# Patient Record
Sex: Female | Born: 1935 | Race: White | Hispanic: No | Smoking: Never smoker
Health system: Southern US, Community
[De-identification: ages and names within clinical notes are randomized; demographics above are authoritative.]

## PROBLEM LIST (undated history)

## (undated) DIAGNOSIS — I2510 Atherosclerotic heart disease of native coronary artery without angina pectoris: Secondary | ICD-10-CM

## (undated) DIAGNOSIS — M1990 Unspecified osteoarthritis, unspecified site: Secondary | ICD-10-CM

## (undated) DIAGNOSIS — N184 Chronic kidney disease, stage 4 (severe): Secondary | ICD-10-CM

## (undated) DIAGNOSIS — M810 Age-related osteoporosis without current pathological fracture: Secondary | ICD-10-CM

## (undated) DIAGNOSIS — K219 Gastro-esophageal reflux disease without esophagitis: Secondary | ICD-10-CM

## (undated) DIAGNOSIS — I10 Essential (primary) hypertension: Secondary | ICD-10-CM

## (undated) DIAGNOSIS — I509 Heart failure, unspecified: Secondary | ICD-10-CM

## (undated) DIAGNOSIS — K279 Peptic ulcer, site unspecified, unspecified as acute or chronic, without hemorrhage or perforation: Secondary | ICD-10-CM

## (undated) DIAGNOSIS — Z9889 Other specified postprocedural states: Secondary | ICD-10-CM

## (undated) DIAGNOSIS — E113599 Type 2 diabetes mellitus with proliferative diabetic retinopathy without macular edema, unspecified eye: Secondary | ICD-10-CM

## (undated) DIAGNOSIS — D649 Anemia, unspecified: Secondary | ICD-10-CM

## (undated) DIAGNOSIS — C801 Malignant (primary) neoplasm, unspecified: Secondary | ICD-10-CM

## (undated) DIAGNOSIS — R0602 Shortness of breath: Secondary | ICD-10-CM

## (undated) DIAGNOSIS — I219 Acute myocardial infarction, unspecified: Secondary | ICD-10-CM

## (undated) DIAGNOSIS — E222 Syndrome of inappropriate secretion of antidiuretic hormone: Secondary | ICD-10-CM

## (undated) DIAGNOSIS — E7800 Pure hypercholesterolemia, unspecified: Secondary | ICD-10-CM

## (undated) DIAGNOSIS — R51 Headache: Secondary | ICD-10-CM

## (undated) HISTORY — PX: APPENDECTOMY: SHX54

## (undated) HISTORY — DX: Anemia, unspecified: D64.9

## (undated) HISTORY — PX: EYE SURGERY: SHX253

## (undated) HISTORY — DX: Pure hypercholesterolemia, unspecified: E78.00

## (undated) HISTORY — DX: Type 2 diabetes mellitus with proliferative diabetic retinopathy without macular edema, unspecified eye: E11.3599

## (undated) HISTORY — DX: Peptic ulcer, site unspecified, unspecified as acute or chronic, without hemorrhage or perforation: K27.9

## (undated) HISTORY — DX: Age-related osteoporosis without current pathological fracture: M81.0

## (undated) HISTORY — DX: Syndrome of inappropriate secretion of antidiuretic hormone: E22.2

## (undated) HISTORY — PX: TONSILLECTOMY: SUR1361

## (undated) HISTORY — DX: Gastro-esophageal reflux disease without esophagitis: K21.9

## (undated) HISTORY — DX: Essential (primary) hypertension: I10

## (undated) HISTORY — DX: Chronic kidney disease, stage 4 (severe): N18.4

## (undated) HISTORY — DX: Other specified postprocedural states: Z98.890

---

## 1998-07-09 ENCOUNTER — Encounter: Payer: Self-pay | Admitting: Ophthalmology

## 1998-07-14 ENCOUNTER — Ambulatory Visit (HOSPITAL_COMMUNITY): Admission: RE | Admit: 1998-07-14 | Discharge: 1998-07-15 | Payer: Self-pay | Admitting: Ophthalmology

## 1998-11-17 ENCOUNTER — Ambulatory Visit (HOSPITAL_COMMUNITY): Admission: RE | Admit: 1998-11-17 | Discharge: 1998-11-17 | Payer: Self-pay | Admitting: Ophthalmology

## 1999-11-09 ENCOUNTER — Ambulatory Visit (HOSPITAL_COMMUNITY): Admission: RE | Admit: 1999-11-09 | Discharge: 1999-11-10 | Payer: Self-pay | Admitting: Ophthalmology

## 1999-11-09 ENCOUNTER — Encounter: Payer: Self-pay | Admitting: Ophthalmology

## 2000-06-13 ENCOUNTER — Ambulatory Visit (HOSPITAL_COMMUNITY): Admission: RE | Admit: 2000-06-13 | Discharge: 2000-06-13 | Payer: Self-pay | Admitting: Ophthalmology

## 2000-06-20 ENCOUNTER — Other Ambulatory Visit: Admission: RE | Admit: 2000-06-20 | Discharge: 2000-06-20 | Payer: Self-pay | Admitting: Family Medicine

## 2000-06-21 ENCOUNTER — Encounter: Payer: Self-pay | Admitting: Family Medicine

## 2000-06-21 ENCOUNTER — Ambulatory Visit (HOSPITAL_COMMUNITY): Admission: RE | Admit: 2000-06-21 | Discharge: 2000-06-21 | Payer: Self-pay | Admitting: Family Medicine

## 2001-09-16 ENCOUNTER — Emergency Department (HOSPITAL_COMMUNITY): Admission: EM | Admit: 2001-09-16 | Discharge: 2001-09-16 | Payer: Self-pay | Admitting: Emergency Medicine

## 2002-10-16 ENCOUNTER — Encounter: Payer: Self-pay | Admitting: Emergency Medicine

## 2002-10-16 ENCOUNTER — Emergency Department (HOSPITAL_COMMUNITY): Admission: EM | Admit: 2002-10-16 | Discharge: 2002-10-16 | Payer: Self-pay | Admitting: Emergency Medicine

## 2003-06-02 ENCOUNTER — Inpatient Hospital Stay (HOSPITAL_COMMUNITY): Admission: EM | Admit: 2003-06-02 | Discharge: 2003-06-06 | Payer: Self-pay | Admitting: Emergency Medicine

## 2003-12-03 ENCOUNTER — Observation Stay (HOSPITAL_COMMUNITY): Admission: RE | Admit: 2003-12-03 | Discharge: 2003-12-04 | Payer: Self-pay | Admitting: General Surgery

## 2005-06-09 ENCOUNTER — Ambulatory Visit (HOSPITAL_COMMUNITY): Admission: RE | Admit: 2005-06-09 | Discharge: 2005-06-09 | Payer: Self-pay | Admitting: Family Medicine

## 2008-04-09 ENCOUNTER — Ambulatory Visit (HOSPITAL_COMMUNITY): Admission: RE | Admit: 2008-04-09 | Discharge: 2008-04-09 | Payer: Self-pay | Admitting: Orthopaedic Surgery

## 2009-10-05 DIAGNOSIS — Z9889 Other specified postprocedural states: Secondary | ICD-10-CM

## 2009-10-05 HISTORY — DX: Other specified postprocedural states: Z98.890

## 2009-10-14 ENCOUNTER — Ambulatory Visit (HOSPITAL_COMMUNITY): Admission: RE | Admit: 2009-10-14 | Discharge: 2009-10-14 | Payer: Self-pay | Admitting: Family Medicine

## 2009-10-15 ENCOUNTER — Ambulatory Visit: Payer: Self-pay | Admitting: Internal Medicine

## 2009-10-15 ENCOUNTER — Inpatient Hospital Stay (HOSPITAL_COMMUNITY): Admission: EM | Admit: 2009-10-15 | Discharge: 2009-10-19 | Payer: Self-pay | Admitting: Emergency Medicine

## 2009-10-16 ENCOUNTER — Encounter (INDEPENDENT_AMBULATORY_CARE_PROVIDER_SITE_OTHER): Payer: Self-pay | Admitting: Family Medicine

## 2009-10-16 ENCOUNTER — Ambulatory Visit: Payer: Self-pay | Admitting: Gastroenterology

## 2009-10-16 HISTORY — PX: ESOPHAGOGASTRODUODENOSCOPY: SHX1529

## 2009-10-17 ENCOUNTER — Ambulatory Visit: Payer: Self-pay | Admitting: Internal Medicine

## 2009-10-22 ENCOUNTER — Encounter (INDEPENDENT_AMBULATORY_CARE_PROVIDER_SITE_OTHER): Payer: Self-pay

## 2009-10-22 ENCOUNTER — Encounter: Payer: Self-pay | Admitting: Urgent Care

## 2009-10-22 ENCOUNTER — Telehealth (INDEPENDENT_AMBULATORY_CARE_PROVIDER_SITE_OTHER): Payer: Self-pay

## 2009-10-22 DIAGNOSIS — D649 Anemia, unspecified: Secondary | ICD-10-CM

## 2009-10-31 ENCOUNTER — Ambulatory Visit: Payer: Self-pay | Admitting: Cardiology

## 2009-10-31 ENCOUNTER — Inpatient Hospital Stay (HOSPITAL_COMMUNITY): Admission: EM | Admit: 2009-10-31 | Discharge: 2009-11-04 | Payer: Self-pay | Admitting: Emergency Medicine

## 2009-11-02 ENCOUNTER — Ambulatory Visit: Payer: Self-pay | Admitting: Internal Medicine

## 2009-11-03 ENCOUNTER — Encounter (INDEPENDENT_AMBULATORY_CARE_PROVIDER_SITE_OTHER): Payer: Self-pay | Admitting: Family Medicine

## 2009-11-05 DIAGNOSIS — Z9889 Other specified postprocedural states: Secondary | ICD-10-CM

## 2009-11-05 HISTORY — DX: Other specified postprocedural states: Z98.890

## 2009-11-10 ENCOUNTER — Encounter: Payer: Self-pay | Admitting: Internal Medicine

## 2009-11-30 ENCOUNTER — Encounter: Payer: Self-pay | Admitting: Urgent Care

## 2009-12-04 ENCOUNTER — Ambulatory Visit (HOSPITAL_COMMUNITY): Admission: RE | Admit: 2009-12-04 | Discharge: 2009-12-04 | Payer: Self-pay | Admitting: Internal Medicine

## 2009-12-04 ENCOUNTER — Ambulatory Visit: Payer: Self-pay | Admitting: Internal Medicine

## 2009-12-04 HISTORY — PX: COLONOSCOPY: SHX174

## 2009-12-08 ENCOUNTER — Emergency Department (HOSPITAL_COMMUNITY): Admission: EM | Admit: 2009-12-08 | Discharge: 2009-12-08 | Payer: Self-pay | Admitting: Emergency Medicine

## 2009-12-11 ENCOUNTER — Encounter: Payer: Self-pay | Admitting: Internal Medicine

## 2009-12-17 LAB — CONVERTED CEMR LAB
Basophils Absolute: 0 10*3/uL (ref 0.0–0.1)
Basophils Relative: 0 % (ref 0–1)
Calcium: 9.1 mg/dL (ref 8.4–10.5)
Eosinophils Absolute: 0.2 10*3/uL (ref 0.0–0.7)
Eosinophils Relative: 3 % (ref 0–5)
Glucose, Bld: 156 mg/dL — ABNORMAL HIGH (ref 70–99)
Lymphocytes Relative: 21 % (ref 12–46)
Lymphs Abs: 1.8 10*3/uL (ref 0.7–4.0)
MCHC: 32.9 g/dL (ref 30.0–36.0)
Monocytes Absolute: 0.5 10*3/uL (ref 0.1–1.0)
Monocytes Relative: 6 % (ref 3–12)
Neutro Abs: 6 10*3/uL (ref 1.7–7.7)
RBC: 3.35 M/uL — ABNORMAL LOW (ref 3.87–5.11)
Sodium: 134 meq/L — ABNORMAL LOW (ref 135–145)
WBC: 8.6 10*3/uL (ref 4.0–10.5)

## 2009-12-28 ENCOUNTER — Ambulatory Visit: Payer: Self-pay | Admitting: Internal Medicine

## 2009-12-28 DIAGNOSIS — Z860100 Personal history of colon polyps, unspecified: Secondary | ICD-10-CM | POA: Insufficient documentation

## 2009-12-28 DIAGNOSIS — Z8711 Personal history of peptic ulcer disease: Secondary | ICD-10-CM

## 2009-12-28 DIAGNOSIS — K219 Gastro-esophageal reflux disease without esophagitis: Secondary | ICD-10-CM

## 2009-12-29 ENCOUNTER — Encounter (INDEPENDENT_AMBULATORY_CARE_PROVIDER_SITE_OTHER): Payer: Self-pay

## 2009-12-29 ENCOUNTER — Encounter: Payer: Self-pay | Admitting: Urgent Care

## 2010-01-19 ENCOUNTER — Encounter: Payer: Self-pay | Admitting: Urgent Care

## 2010-01-22 LAB — CONVERTED CEMR LAB
Basophils Relative: 1 % (ref 0–1)
Eosinophils Absolute: 0.2 10*3/uL (ref 0.0–0.7)
HCT: 36.9 % (ref 36.0–46.0)
Lymphs Abs: 1.7 10*3/uL (ref 0.7–4.0)
MCHC: 31.4 g/dL (ref 30.0–36.0)
MCV: 93.4 fL (ref 78.0–100.0)
Neutro Abs: 5.9 10*3/uL (ref 1.7–7.7)
Platelets: 283 10*3/uL (ref 150–400)
RDW: 12.8 % (ref 11.5–15.5)

## 2010-04-06 NOTE — Letter (Signed)
Summary: TRIAGE ORDER  TRIAGE ORDER   Imported By: Ave Filter 11/10/2009 12:20:53  _____________________________________________________________________  External Attachment:    Type:   Image     Comment:   External Document  Appended Document: TRIAGE ORDER Day of prep, Lantus 17 units am and 15 units pm.   Appended Document: TRIAGE ORDER Diabetic med adjustments written on instructions and mailed to the pt.

## 2010-04-06 NOTE — Letter (Signed)
Summary: TCS INSTRUCTIONS  TCS INSTRUCTIONS   Imported By: Ave Filter 11/10/2009 14:29:03  _____________________________________________________________________  External Attachment:    Type:   Image     Comment:   External Document

## 2010-04-06 NOTE — Miscellaneous (Signed)
Summary: Orders Update  Clinical Lists Changes  Problems: Added new problem of ANEMIA (ICD-285.9) Orders: Added new Test order of T-CBC w/Diff [phone]) - Signed Added new Test order of T-Basic Metabolic Panel [phone]) - Signed

## 2010-04-06 NOTE — Letter (Signed)
Summary: RADIOLOGY RESULTS  RADIOLOGY RESULTS   Imported By: Rexene Alberts 10/15/2009 13:53:01  _____________________________________________________________________  External Attachment:    Type:   Image     Comment:   External Document

## 2010-04-06 NOTE — Letter (Signed)
Summary: Recall, Labs Needed  [facility]  [address]   [place], [place] [address]   Phone: [phone]  Fax: [phone]    December 29, 2009  Claudia [name] [address] [place], [place]  [address] Jul 09, 1931   Dear [name],   Our records indicate it is time to repeat your blood work.  You can take the enclosed form to the lab on or near the date indicated.  Please make note of the new location of the lab:   [address], 2nd floor   [facility]  Our office will call you within a week to ten business days with the results.  If you do not hear from [name] in 10 business days, you should call the office.  If you have any questions regarding this, call the office at [phone], and ask for the nurse.  Labs are due on 01/27/2010.   Sincerely,    Hendricks Limes LPN  [facility] Ph: [phone]   Fax: [phone]

## 2010-04-06 NOTE — Miscellaneous (Signed)
Summary: Orders Update  Clinical Lists Changes  Orders: Added new Test order of T-CBC w/Diff ([id number]) - Signed 

## 2010-04-06 NOTE — Letter (Signed)
Summary: Recall, Labs Needed  [facility]  [address]   [place], [place] [address]   Phone: [phone]  Fax: [phone]    October 22, 2009  Claudia [name] [address] [place], [place]  [address] 1931-10-13   Dear [name],   Our records indicate it is time to repeat your blood work.  You can take the enclosed form to the lab on or near the date indicated.  Please make note of the new location of the lab:   [address], 2nd floor   [facility]  Our office will call you within a week to ten business days with the results.  If you do not hear from [name] in 10 business days, you should call the office.  If you have any questions regarding this, call the office at [phone], and ask for the nurse.  Labs are due on 11/17/2009.   Sincerely,    Hendricks Limes LPN  [facility] Ph: [phone]   Fax: [phone]   Appended Document: Recall, Labs Needed Patient is inpatient at [facility]. Na 115, low again. Likely due to diuretics. Discussed with RMR. Cancel OV here for 9/15.  Recommend TCS with RMR, for FH CRC. Intermittent diarrhea. Patient can be triaged. She will need MET-7 couple of days prior to procedure.   Appended Document: Recall, Labs Needed Pt is an inp in the hospital will call alter discharge.

## 2010-04-06 NOTE — Assessment & Plan Note (Signed)
Summary: f/h crc,INTERMITTEN DIARRHEA/ss   Visit Type:  Follow-up Visit Primary Care Provider:  McInnis  Chief Complaint:  F/U .  History of Present Illness: 77 y/o caucasian female here for FU PUD and diarrhea s/p recent colonoscopy and EGD.  c/o nausea early AM or late at night.  Denies vomiting,  Protonix two times a day.  Taking vicodin two times a day for back pain.  Also takes 2 ES tylenol two times a day.  Denies abdominal pain.  BM every other day without rectal bleeding or melena.  Denies heartburn or indigestion.  Denies dysphagia or odynophagia. Last Hgb 9.9 on 9/26 & creat 1.75.    Current Problems (verified): 1)  Colonic Polyps, Adenomatous, Hx of  (ICD-V12.72) 2)  Gerd  (ICD-530.81) 3)  Pud, Hx of  (ICD-V12.71) 4)  Anemia  (ICD-285.9)  Current Medications (verified): 1)  Vytorin 10-20 Mg Tabs (Ezetimibe-Simvastatin) .... Take 1 Tablet By Mouth Once A Day 2)  Lantus 100 Unit/ml Soln (Insulin Glargine) .... 35 Units in The Am and 30 Units Pm 3)  Protonix 40 Mg Tbec (Pantoprazole Sodium) .... Take 1 Tablet By Mouth Two Times A Day 4)  Benefiber .... Three Times Daily 5)  Diovan 80 Mg Tabs (Valsartan) .... Take 1 Tablet By Mouth Once A Day 6)  Benadryl .... As Needed 7)  Pain Pill .... As Needed 8)  Tylenol Extra Strength 500 Mg Tabs (Acetaminophen) .... Two Tablets in The Am and Two  Tablets in The Pm  Allergies (verified): No Known Drug Allergies  Review of Systems      See HPI General:  Complains of fatigue; denies fever, chills, sweats, anorexia, weakness, malaise, and weight loss. CV:  Denies chest pains, angina, palpitations, syncope, dyspnea on exertion, orthopnea, PND, peripheral edema, and claudication. Resp:  Denies dyspnea at rest, dyspnea with exercise, cough, sputum, wheezing, coughing up blood, and pleurisy. GI:  See HPI; Denies difficulty swallowing, pain on swallowing, jaundice, gas/bloating, and fecal incontinence. GU:  Denies urinary burning, blood in  urine, nocturnal urination, urinary frequency, and urinary incontinence. MS:  Denies joint pain / LOM, joint swelling, joint stiffness, joint deformity, low back pain, muscle weakness, muscle cramps, muscle atrophy, leg pain at night, leg pain with exertion, and shoulder pain / LOM hand / wrist pain (CTS). Derm:  Denies rash, itching, dry skin, hives, moles, warts, and unhealing ulcers. Psych:  Denies depression, anxiety, memory loss, suicidal ideation, hallucinations, paranoia, phobia, and confusion. Heme:  Denies bruising, bleeding, and enlarged lymph nodes.  Vital Signs:  Patient profile:   77 year old female Height:      67 inches Weight:      220 pounds BMI:     34.58 Temp:     97.8 degrees F oral Pulse rate:   80 / minute BP sitting:   172 / 72  (left arm) Cuff size:   large  Vitals Entered By: Cloria Spring LPN (December 28, 2009 2:10 PM)  Physical Exam  General:  Well developed, obese, no acute distress. Head:  Normocephalic and atraumatic. Eyes:  Sclera clear, no icterus. Mouth:  No deformity or lesions, dentition normal. Neck:  Supple; no masses or thyromegaly. Heart:  Regular rate and rhythm; no murmurs, rubs,  or bruits. Abdomen:  Soft, nontender and nondistended. No masses, hepatosplenomegaly or hernias noted. Normal bowel sounds.without guarding and without rebound.   Msk:  Symmetrical with no gross deformities. Normal posture. Pulses:  Normal pulses noted. Neurologic:  Alert and  oriented  x4;  grossly normal neurologically. Skin:  Intact without significant lesions or rashes. Cervical Nodes:  No significant cervical adenopathy. Psych:  Alert and cooperative. Normal mood and affect.  Impression & Recommendations:  Problem # 1:  PUD, HX OF (ICD-V12.71) Doing well on two times a day PPI.  Some c/o nausea without emesis.  Suspect secondary to healing PUD, but vicodin may be contributing to this.  Gastroparesis remains in differential.    Problem # 2:  GERD  (ICD-530.81) Well controlled on BID PPI.  Problem # 3:  ANEMIA (ICD-285.9) Suspect secondary to PUD.  Problem # 4:  COLONIC POLYPS, ADENOMATOUS, HX OF (ICD-V12.72) Next colonoscopy 11/2014  Other Orders: Est. Patient Level III (04540)  Patient Instructions: 1)  Progress report in 3-4 wks.   2)  If nausea persist, GES 3)  Repeat CBC in 4 weeks. 4)  Consider decreasing PPI to once daily if symptoms resolved in 3-4 weeks 5)  Limit use of Vicodin and tylenol at the same time due to tylenol being in both products 6)  Talk w/ your doctor about this

## 2010-04-06 NOTE — Letter (Signed)
Summary: Patient Notice, Colon Biopsy Results  [facility]  [address]   [place], [place] [address]   Phone: [phone]  Fax: [phone]       December 11, 2009   Claudia [name] [address] [place], [place]  [address] 26-Nov-1931    Dear [name],  I am pleased to inform you that the biopsies taken during your recent colonoscopy did not show any evidence of cancer upon pathologic examination.  Additional information/recommendations:  You should have a repeat colonoscopy examination  in 5 years.  Please call us if you are having persistent problems or have questions about your condition that have not been fully answered at this time.  Sincerely,    R. Roetta Sessions MD, FACP [facility] Ph: [phone]    Fax: [phone]   Appended Document: Patient Notice, Colon Biopsy Results letter mailed to pt  Appended Document: Patient Notice, Colon Biopsy Results REMINDER IN COMPUTER

## 2010-04-06 NOTE — Progress Notes (Signed)
Summary: lab order  ---- Converted from flag ---- ---- 10/19/2009 9:02 AM, Joselyn Arrow FNP-BC wrote: Per RMR, pt needs CBC, Met 7 prior to an OV in 3-4 weeks to setup screening TCS re:FU hosp, anemia, hyponatremia.  Please arrange labs & OV.  Thanks, KJ ------------------------------ pt has ov on Sept 15th . lab order mailed to pt

## 2010-04-06 NOTE — Letter (Signed)
Summary: CONSULTATION  CONSULTATION   Imported By: Rexene Alberts 10/15/2009 15:25:49  _____________________________________________________________________  External Attachment:    Type:   Image     Comment:   External Document

## 2010-04-08 ENCOUNTER — Encounter (INDEPENDENT_AMBULATORY_CARE_PROVIDER_SITE_OTHER): Payer: Self-pay | Admitting: *Deleted

## 2010-04-13 NOTE — Discharge Summary (Signed)
[name], [name]               ACCOUNT NO.:  [id number]  MEDICAL RECORD NO.:  [id number]         PATIENT TYPE:  PINP  LOCATION:  A308                          FACILITY:  [facility]  PHYSICIAN:  Claudia Sobel G. Renard Matter, MD   DATE OF BIRTH:  1932/01/10  DATE OF ADMISSION: DATE OF DISCHARGE:  LH                              DISCHARGE SUMMARY   A 77 year old white female admitted October 31, 2009, discharged November 04, 2009, 4 days' hospitalization.  DIAGNOSES:  Acute gastritis, previous gastric ulcers, hyponatremia, diabetes mellitus, hypertension.  CONDITION:  Stable at the time of her discharge.  This patient came to the emergency room because of nausea and vomiting.  She was in the usual state of health when she started having sudden onset of nausea and vomiting, symptoms continued to become worse.  She became weak and was brought to emergency room where she was evaluated and found to have a sodium of 115.  She had no fever or change in mental status.  She has had no diarrhea.  She started on IV antibiotics and was admitted for further evaluation.  Also, intravenous fluids was started.  OBJECTIVE:  VITAL SIGNS:  Blood pressure 149/61, pulse 73, respirations 20, temperature 97. HEENT:  Eyes, PERRLA.  TMs negative.  Oropharynx benign. NECK:  Supple.  No JVD or thyroid abnormalities. HEART:  Regular rhythm.  No murmurs. LUNGS:  Clear to P and A. ABDOMEN:  No palpable organs or masses.  Positive bowel sounds. EXTREMITIES:  Free of edema.  LABORATORY DATA:  CBC on admission; WBC 8100 with hemoglobin 10.4, hematocrit 30.6.  Urinalysis on admission essentially negative.  Lipase 25.  BMET on admission, sodium 115, potassium 4.4, chloride 83, CO2 23, glucose 291, BUN 23, creatinine 1.26.  A BNP was 219.  Subsequent BMET on November 03, 2009, sodium 137, potassium 4.2, chloride 106, CO2 25, glucose 85, BUN 12.  Subsequent BNP 166.  HOSPITAL COURSE:  The patient at the time of her admission was  placed on 2000 calorie ADA diet.  At the time of the patient's admission, she was placed on IV fluids, normal saline 125 mL/hour.  Accu-Cheks a.c. and at bedtime, sliding scale NovoLog insulin.  She was given Zofran 4 mg every 4 h. p.r.n. for nausea and Lovenox was started 40 mg subcutaneous a day. She was continued on Protonix 40 mg daily and Diovan 80 mg daily.  Her Maxzide was held.  Her BMET was monitored showing gradually improving sodium levels.  She was able to be placed back on 2000 calorie ADA diet on November 02, 2009.  Her Lantus was started back at 30 units per day.  A 2-D echo was done because of slightly elevated BNP which was essentially normal.  She was seen in consultation by the GI Service.  Of note, she had previous gastric ulcers.  No further diagnostic tests were ordered. She was placed on Protonix 40 mg daily.  The patient slowly and progressively improved throughout the hospital stay and was able to be discharged on the fourth hospital day.  The patient was discharged on the following medications, Lantus insulin 30 units  daily, Benadryl 25 mg every 6 h. as needed, Diovan 80 mg daily, docusate sodium 1-2 tablets daily as needed, Lortab 5/500 every 4 h. p.r.n., Protonix 40 mg b.i.d., Vytorin 10/21 daily.  The patient was stable and improved at the time of her discharge.     Claudia Hasler G. Renard Matter, MD     AGM/MEDQ  D:  11/04/2009  T:  11/04/2009  Job:  [id number]  Electronically Signed by Claudia Penny MD on 04/13/2010 09:01:12 PM

## 2010-04-14 NOTE — Letter (Signed)
Summary: Recall Office Visit  [facility]  [address]   [place], [place] [address]   Phone: [phone]  Fax: [phone]      April 08, 2010   Claudia [name] [address] [place], [place]  [address] November 06, 1931   Dear [name],   According to our records, it is time for you to schedule a follow-up office visit with [name].   At your convenience, please call [phone] to schedule an office visit. If you have any questions, concerns, or feel that this letter is in error, we would appreciate your call.   Sincerely,    Diana Eves  [facility] Ph: [phone]   Fax: [phone]

## 2010-05-03 ENCOUNTER — Encounter: Payer: Self-pay | Admitting: Gastroenterology

## 2010-05-03 ENCOUNTER — Ambulatory Visit (INDEPENDENT_AMBULATORY_CARE_PROVIDER_SITE_OTHER): Payer: Medicare HMO | Admitting: Gastroenterology

## 2010-05-03 DIAGNOSIS — Z86010 Personal history of colonic polyps: Secondary | ICD-10-CM

## 2010-05-03 DIAGNOSIS — Z8711 Personal history of peptic ulcer disease: Secondary | ICD-10-CM

## 2010-05-13 NOTE — Assessment & Plan Note (Addendum)
Summary: FU   Vital Signs:  Patient profile:   77 year old female Height:      67 inches Weight:      220 pounds BMI:     34.58 Temp:     97.8 degrees F oral Pulse rate:   68 / minute BP sitting:   134 / 78  (left arm)  Vitals Entered By: Carolan Clines LPN (May 03, 2010 1:45 PM)  Visit Type:  Follow-up Visit Primary Care Provider:  McInnis   History of Present Illness: Ms. Lillibridge presents in f/u today for nausea, hx PUD and diarrhea. Pt states she is significantly better. Decreased nausea, only has 1-2X every few weeks. Taking Protonix daily, but requesting a generic. States insurance does not want to pay. BM every day; no brbpr. Doing well. Taking fiber. No lack of appetite or wt loss.  Current Problems (verified): 1)  Colonic Polyps, Adenomatous, Hx of  (ICD-V12.72) 2)  Gerd  (ICD-530.81) 3)  Pud, Hx of  (ICD-V12.71) 4)  Anemia  (ICD-285.9)  Current Medications (verified): 1)  Vytorin 10-20 Mg Tabs (Ezetimibe-Simvastatin) .... Take 1 Tablet By Mouth Once A Day 2)  Lantus 100 Unit/ml Soln (Insulin Glargine) .... 35 Units in The Am and 30 Units Pm 3)  Protonix 40 Mg Tbec (Pantoprazole Sodium) .... Take 1 Tablet By Mouth Once Daily 4)  Benefiber .... Once Daily 5)  Diovan 80 Mg Tabs (Valsartan) .... Take 1 Tablet By Mouth Once A Day 6)  Benadryl .... As Needed 7)  Pain Pill .... As Needed 8)  Tylenol Extra Strength 500 Mg Tabs (Acetaminophen) .... Two Tablets in The Am and Two  Tablets in The Pm  Allergies (verified): No Known Drug Allergies  Review of Systems General:  Denies fever, chills, and anorexia. Eyes:  Denies blurring, irritation, and discharge. ENT:  Denies sore throat, hoarseness, and difficulty swallowing. CV:  Denies chest pains and syncope. Resp:  Denies dyspnea at rest and wheezing. GI:  See HPI. GU:  Denies urinary burning and urinary frequency. MS:  Denies joint pain / LOM, joint swelling, and joint stiffness. Derm:  Denies rash, itching, and dry  skin. Neuro:  Denies weakness and syncope. Psych:  Denies depression and anxiety.  Physical Exam  General:  Well developed, well nourished, no acute distress. Head:  Normocephalic and atraumatic. Eyes:  PERRLA, no icterus. Mouth:  No deformity or lesions, dentition normal. Abdomen:  +BS, obese, soft, non-tender, non-distended. no HSM. no rebound or guarding. Skin:  Intact without significant lesions or rashes. Psych:  Alert and cooperative. Normal mood and affect.   Impression & Recommendations:  Problem # 1:  PUD, HX OF (ICD-V12.71)  hx of PUD, on Protonix daily. significantly decreased nausea since last visit. No dysphagia/odynophagia, no abdominal pain. Doing well. Requesting to switch from protonix to another medication that is more cost-effective.   Prilosec 20 mg daily (12 samples given) Return in 6 mos for reassessment No need for GES at this time as nausea has significantly improved/almost resolved  Orders: Est. Patient Level II (30865)  Problem # 2:  COLONIC POLYPS, ADENOMATOUS, HX OF (ICD-V12.72)  due for repeat in about 5 years. BM daily, no brbpr.   Orders: Est. Patient Level II (78469)  Problem # 3:  ANEMIA (ICD-285.9) hx of anemia, last checked in November, almost normalized. Dr. Renard Matter was to recheck.    Obtain updated CBC if not already done, request labs from Dr. Renard Matter   Orders Added: 1)  Est. Patient  Level II [[address]]  Appended Document: FU can we obtain an updated CBC from Dr. Renard Matter' office (if done within past few weeks). If not, let's go ahead and get one on file.   Appended Document: FU RECORDS ON YOUR DESK  Appended Document: FU 6 MONTH F/U OPV IS IN THE COMPUTER  Appended Document: FU received some labs from Dr. Renard Matter from September, but no CBC. Please have pt have updated CBC in next few weeks.   Appended Document: FU lab order in mail to pt

## 2010-05-14 ENCOUNTER — Encounter: Payer: Self-pay | Admitting: Gastroenterology

## 2010-05-14 ENCOUNTER — Encounter (INDEPENDENT_AMBULATORY_CARE_PROVIDER_SITE_OTHER): Payer: Self-pay

## 2010-05-18 NOTE — Letter (Signed)
Summary: Recall, Labs Needed  [facility]  [address]   [place], [place] [address]   Phone: [phone]  Fax: [phone]    May 14, 2010  [place] [address] [place], [place]  [address]  [place] Jul 02, 1931   Dear Ms. Cavanah,   Our records indicate it is time to repeat your blood work.  You can take the enclosed form to the lab on or near the date indicated.  Please make note of the new location of the lab:   [address], 2nd [address]   [facility]  Our office will call you within a week to ten business days with the results.  If you do not hear from [name] in 10 business days, you should call the office.  If you have any questions regarding this, call the office at [phone], and ask for the nurse.    Sincerely,    Hendricks Limes LPN  [facility] Ph: [phone]   Fax: [phone]

## 2010-05-18 NOTE — Miscellaneous (Addendum)
Summary: Orders Update  Clinical Lists Changes  Orders: Added new Test order of T-CBC w/Diff ([id number]) - Signed 

## 2010-05-20 LAB — FECAL LACTOFERRIN, QUANT

## 2010-05-20 LAB — CLOSTRIDIUM DIFFICILE EIA: C difficile Toxins A+B, EIA: NEGATIVE

## 2010-05-20 LAB — STOOL CULTURE

## 2010-05-20 LAB — GLUCOSE, CAPILLARY: Glucose-Capillary: 85 mg/dL (ref 70–99)

## 2010-05-20 LAB — OVA AND PARASITE EXAMINATION: Ova and parasites: NONE SEEN

## 2010-05-21 LAB — GLUCOSE, CAPILLARY
Glucose-Capillary: 105 mg/dL — ABNORMAL HIGH (ref 70–99)
Glucose-Capillary: 115 mg/dL — ABNORMAL HIGH (ref 70–99)
Glucose-Capillary: 135 mg/dL — ABNORMAL HIGH (ref 70–99)
Glucose-Capillary: 147 mg/dL — ABNORMAL HIGH (ref 70–99)
Glucose-Capillary: 153 mg/dL — ABNORMAL HIGH (ref 70–99)
Glucose-Capillary: 173 mg/dL — ABNORMAL HIGH (ref 70–99)
Glucose-Capillary: 236 mg/dL — ABNORMAL HIGH (ref 70–99)
Glucose-Capillary: 122 mg/dL — ABNORMAL HIGH (ref 70–99)
Glucose-Capillary: 142 mg/dL — ABNORMAL HIGH (ref 70–99)
Glucose-Capillary: 146 mg/dL — ABNORMAL HIGH (ref 70–99)
Glucose-Capillary: 150 mg/dL — ABNORMAL HIGH (ref 70–99)
Glucose-Capillary: 173 mg/dL — ABNORMAL HIGH (ref 70–99)
Glucose-Capillary: 175 mg/dL — ABNORMAL HIGH (ref 70–99)
Glucose-Capillary: 180 mg/dL — ABNORMAL HIGH (ref 70–99)
Glucose-Capillary: 199 mg/dL — ABNORMAL HIGH (ref 70–99)
Glucose-Capillary: 229 mg/dL — ABNORMAL HIGH (ref 70–99)
Glucose-Capillary: 236 mg/dL — ABNORMAL HIGH (ref 70–99)
Glucose-Capillary: 239 mg/dL — ABNORMAL HIGH (ref 70–99)
Glucose-Capillary: 320 mg/dL — ABNORMAL HIGH (ref 70–99)
Glucose-Capillary: 95 mg/dL (ref 70–99)

## 2010-05-21 LAB — BASIC METABOLIC PANEL
BUN: 11 mg/dL (ref 6–23)
BUN: 12 mg/dL (ref 6–23)
BUN: 23 mg/dL (ref 6–23)
BUN: 11 mg/dL (ref 6–23)
BUN: 14 mg/dL (ref 6–23)
CO2: 23 mEq/L (ref 19–32)
CO2: 25 mEq/L (ref 19–32)
CO2: 28 mEq/L (ref 19–32)
CO2: 25 mEq/L (ref 19–32)
Calcium: 8.7 mg/dL (ref 8.4–10.5)
Calcium: 9.1 mg/dL (ref 8.4–10.5)
Calcium: 8.5 mg/dL (ref 8.4–10.5)
Calcium: 9 mg/dL (ref 8.4–10.5)
Chloride: 106 mEq/L (ref 96–112)
Chloride: 83 mEq/L — ABNORMAL LOW (ref 96–112)
Creatinine, Ser: 0.97 mg/dL (ref 0.4–1.2)
Creatinine, Ser: 0.97 mg/dL (ref 0.4–1.2)
Creatinine, Ser: 1.11 mg/dL (ref 0.4–1.2)
Creatinine, Ser: 1.26 mg/dL — ABNORMAL HIGH (ref 0.4–1.2)
Creatinine, Ser: 0.95 mg/dL (ref 0.4–1.2)
GFR calc Af Amer: 58 mL/min — ABNORMAL LOW (ref >60)
GFR calc Af Amer: >60 mL/min (ref >60)
GFR calc Af Amer: >60 mL/min (ref >60)
GFR calc non Af Amer: 56 mL/min — ABNORMAL LOW (ref >60)
GFR calc non Af Amer: 58 mL/min — ABNORMAL LOW (ref >60)
GFR calc non Af Amer: >60 mL/min (ref >60)
GFR calc non Af Amer: 57 mL/min — ABNORMAL LOW (ref >60)
GFR calc non Af Amer: >60 mL/min (ref >60)
Glucose, Bld: 291 mg/dL — ABNORMAL HIGH (ref 70–99)
Glucose, Bld: 82 mg/dL (ref 70–99)
Glucose, Bld: 119 mg/dL — ABNORMAL HIGH (ref 70–99)
Potassium: 3.7 mEq/L (ref 3.5–5.1)
Potassium: 3.8 mEq/L (ref 3.5–5.1)
Potassium: 4.4 mEq/L (ref 3.5–5.1)
Potassium: 3.9 mEq/L (ref 3.5–5.1)
Sodium: 115 mEq/L — CL (ref 135–145)
Sodium: 138 mEq/L (ref 135–145)

## 2010-05-21 LAB — URINE CULTURE
Colony Count: >=100000
Culture  Setup Time: 201108112244

## 2010-05-21 LAB — COMPREHENSIVE METABOLIC PANEL
BUN: 7 mg/dL (ref 6–23)
CO2: 25 mEq/L (ref 19–32)
Calcium: 8.5 mg/dL (ref 8.4–10.5)
Creatinine, Ser: 1.05 mg/dL (ref 0.4–1.2)
GFR calc non Af Amer: 51 mL/min — ABNORMAL LOW (ref >60)
Glucose, Bld: 130 mg/dL — ABNORMAL HIGH (ref 70–99)
Sodium: 126 mEq/L — ABNORMAL LOW (ref 135–145)
Total Protein: 5.8 g/dL — ABNORMAL LOW (ref 6.0–8.3)

## 2010-05-21 LAB — URINALYSIS, ROUTINE W REFLEX MICROSCOPIC
Bilirubin Urine: NEGATIVE
Glucose, UA: >1000 mg/dL — AB
Ketones, ur: NEGATIVE mg/dL
Nitrite: NEGATIVE
Protein, ur: NEGATIVE mg/dL
Specific Gravity, Urine: <1.005 — ABNORMAL LOW (ref 1.005–1.030)
Specific Gravity, Urine: 1.015 (ref 1.005–1.030)
pH: 6.5 (ref 5.0–8.0)

## 2010-05-21 LAB — CBC
HCT: 30.6 % — ABNORMAL LOW (ref 36.0–46.0)
HCT: 28 % — ABNORMAL LOW (ref 36.0–46.0)
HCT: 30.1 % — ABNORMAL LOW (ref 36.0–46.0)
HCT: 32 % — ABNORMAL LOW (ref 36.0–46.0)
Hemoglobin: 10.4 g/dL — ABNORMAL LOW (ref 12.0–15.0)
Hemoglobin: 9.7 g/dL — ABNORMAL LOW (ref 12.0–15.0)
MCH: 30.5 pg (ref 26.0–34.0)
MCH: 31 pg (ref 26.0–34.0)
MCH: 31.3 pg (ref 26.0–34.0)
MCHC: 34.7 g/dL (ref 30.0–36.0)
MCHC: 34.8 g/dL (ref 30.0–36.0)
MCV: 90.3 fL (ref 78.0–100.0)
Platelets: 281 10*3/uL (ref 150–400)
Platelets: 300 10*3/uL (ref 150–400)
RBC: 3.33 MIL/uL — ABNORMAL LOW (ref 3.87–5.11)
RDW: 12.8 % (ref 11.5–15.5)
RDW: 12.9 % (ref 11.5–15.5)
RDW: 13.1 % (ref 11.5–15.5)
RDW: 13.2 % (ref 11.5–15.5)
WBC: 10.7 10*3/uL — ABNORMAL HIGH (ref 4.0–10.5)
WBC: 6.1 10*3/uL (ref 4.0–10.5)

## 2010-05-21 LAB — DIFFERENTIAL
Basophils Absolute: 0.1 10*3/uL (ref 0.0–0.1)
Basophils Absolute: 0.1 10*3/uL (ref 0.0–0.1)
Basophils Relative: 1 % (ref 0–1)
Basophils Relative: 1 % (ref 0–1)
Eosinophils Absolute: 0.1 10*3/uL (ref 0.0–0.7)
Eosinophils Absolute: 0.1 10*3/uL (ref 0.0–0.7)
Eosinophils Absolute: 0.1 10*3/uL (ref 0.0–0.7)
Eosinophils Relative: 1 % (ref 0–5)
Eosinophils Relative: 2 % (ref 0–5)
Lymphocytes Relative: 12 % (ref 12–46)
Lymphocytes Relative: 13 % (ref 12–46)
Lymphocytes Relative: 17 % (ref 12–46)
Lymphs Abs: 1.2 10*3/uL (ref 0.7–4.0)
Lymphs Abs: 1 10*3/uL (ref 0.7–4.0)
Lymphs Abs: 1.2 10*3/uL (ref 0.7–4.0)
Monocytes Absolute: 0.8 10*3/uL (ref 0.1–1.0)
Monocytes Relative: 11 % (ref 3–12)
Monocytes Relative: 13 % — ABNORMAL HIGH (ref 3–12)
Neutro Abs: 4.1 10*3/uL (ref 1.7–7.7)
Neutro Abs: 6.7 10*3/uL (ref 1.7–7.7)
Neutro Abs: 8.5 10*3/uL — ABNORMAL HIGH (ref 1.7–7.7)
Neutrophils Relative %: 67 % (ref 43–77)
Neutrophils Relative %: 74 % (ref 43–77)
Neutrophils Relative %: 79 % — ABNORMAL HIGH (ref 43–77)

## 2010-05-21 LAB — URINE MICROSCOPIC-ADD ON

## 2010-05-21 LAB — FERRITIN: Ferritin: 284 ng/mL (ref 10–291)

## 2010-05-21 LAB — RETICULOCYTES
Retic Count, Absolute: 65 10*3/uL (ref 19.0–186.0)
Retic Ct Pct: 1.8 % (ref 0.4–3.1)

## 2010-05-21 LAB — D-DIMER, QUANTITATIVE: D-Dimer, Quant: 0.43 ug/mL-FEU (ref 0.00–0.48)

## 2010-05-21 LAB — FOLATE: Folate: >20 ng/mL

## 2010-05-21 LAB — LIPASE, BLOOD: Lipase: 25 U/L (ref 11–59)

## 2010-05-21 LAB — BRAIN NATRIURETIC PEPTIDE
Pro B Natriuretic peptide (BNP): 166 pg/mL — ABNORMAL HIGH (ref 0.0–100.0)
Pro B Natriuretic peptide (BNP): 219 pg/mL — ABNORMAL HIGH (ref 0.0–100.0)

## 2010-05-28 ENCOUNTER — Other Ambulatory Visit: Payer: Self-pay | Admitting: Gastroenterology

## 2010-05-28 LAB — CBC WITH DIFFERENTIAL/PLATELET
Basophils Relative: 1 % (ref 0–1)
Hemoglobin: 11.2 g/dL — ABNORMAL LOW (ref 12.0–15.0)
Lymphocytes Relative: 22 % (ref 12–46)
Lymphs Abs: 1.5 10*3/uL (ref 0.7–4.0)
Monocytes Relative: 5 % (ref 3–12)
Neutro Abs: 4.9 10*3/uL (ref 1.7–7.7)
Neutrophils Relative %: 70 % (ref 43–77)
RBC: 3.79 MIL/uL — ABNORMAL LOW (ref 3.87–5.11)

## 2010-06-09 ENCOUNTER — Other Ambulatory Visit: Payer: Self-pay | Admitting: Gastroenterology

## 2010-06-09 DIAGNOSIS — D649 Anemia, unspecified: Secondary | ICD-10-CM

## 2010-09-28 ENCOUNTER — Other Ambulatory Visit: Payer: Self-pay | Admitting: Gastroenterology

## 2010-09-29 LAB — CBC WITH DIFFERENTIAL/PLATELET
Eosinophils Relative: 2 % (ref 0–5)
HCT: 36.8 % (ref 36.0–46.0)
Hemoglobin: 11.4 g/dL — ABNORMAL LOW (ref 12.0–15.0)
Lymphocytes Relative: 23 % (ref 12–46)
Lymphs Abs: 1.8 10*3/uL (ref 0.7–4.0)
MCV: 97.4 fL (ref 78.0–100.0)
Platelets: 322 10*3/uL (ref 150–400)
RBC: 3.78 MIL/uL — ABNORMAL LOW (ref 3.87–5.11)
WBC: 7.7 10*3/uL (ref 4.0–10.5)

## 2010-10-05 ENCOUNTER — Other Ambulatory Visit: Payer: Self-pay | Admitting: Gastroenterology

## 2010-10-05 DIAGNOSIS — D649 Anemia, unspecified: Secondary | ICD-10-CM

## 2010-10-06 ENCOUNTER — Encounter: Payer: Self-pay | Admitting: Gastroenterology

## 2010-10-06 ENCOUNTER — Ambulatory Visit (INDEPENDENT_AMBULATORY_CARE_PROVIDER_SITE_OTHER): Payer: Medicare HMO | Admitting: Gastroenterology

## 2010-10-06 VITALS — BP 140/74 | HR 86 | Temp 98.3°F | Ht 67.0 in | Wt 214.6 lb

## 2010-10-06 DIAGNOSIS — Z86010 Personal history of colonic polyps: Secondary | ICD-10-CM

## 2010-10-06 DIAGNOSIS — Z8711 Personal history of peptic ulcer disease: Secondary | ICD-10-CM

## 2010-10-06 NOTE — Progress Notes (Signed)
Referring Provider: No ref. provider found Primary Care Physician:  Alice Reichert, MD Primary Gastroenterologist: Dr. Jena Gauss   Chief Complaint  Patient presents with  . Follow-up    HPI:   Claudia [name] presents today in f/u, hx of PUD, documented by EGD Aug 2011, secondary to NSAIDs. She is avoiding this completely. Denies abdominal pain or vomiting. Down 6 lbs purposefully, trying to lose weight. No loss of appetite. 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 Says can be very stressful, and she gets occasionally nauseated every 2-3 weeks.  Unable to do exercise regimen due to work, trying to do power walking at work with buggies.  No melena or brbpr.  Taking Prilosec due to insurance cost, most affordable.   Past Medical History  Diagnosis Date  . GERD (gastroesophageal reflux disease)   . PUD (peptic ulcer disease)   . Anemia   . Hypercholesterolemia   . Hypertension   . S/P endoscopy Aug 2011    3 superficial gastric ulcers, NSAID-induced  . S/P colonoscopy Sept 2011    left-sided diverticula, tubular adenoma    Current Outpatient Prescriptions  Medication Sig Dispense Refill  . cyclobenzaprine (FLEXERIL) 10 MG tablet Take 10 mg by mouth daily as needed.        Marland Kitchen DIOVAN 80 MG tablet       . LANTUS 100 UNIT/ML injection Inject 55 Units into the skin daily.       Marland Kitchen omeprazole (PRILOSEC) 20 MG capsule Take 20 mg by mouth daily.        Marland Kitchen HYDROcodone-acetaminophen (VICODIN) 5-500 MG per tablet         Allergies as of 10/06/2010 - Review Complete 10/06/2010  Allergen Reaction Noted  . Aspirin Other (See Comments) 10/06/2010    History   Social History  . Marital Status: Widowed    Spouse Name: N/A    Number of Children: N/A  . Years of Education: N/A   Social History Main Topics  . Smoking status: Never Smoker   . Smokeless tobacco: None  . Alcohol Use: No  . Drug Use: No  . Sexually Active: None   Other Topics Concern  . None   Social History  Narrative  . None    Review of Systems: Gen: Denies fever, chills, anorexia. Denies fatigue, weakness, weight loss.  CV: Denies chest pain, palpitations, syncope, peripheral edema, and claudication. Resp: Denies dyspnea at rest, cough, wheezing, coughing up blood, and pleurisy. GI: Denies vomiting blood, jaundice, and fecal incontinence.   Denies dysphagia or odynophagia. Derm: Denies rash, itching, dry skin Psych: Denies depression, anxiety, memory loss, confusion. No homicidal or suicidal ideation.  Heme: Denies bruising, bleeding, and enlarged lymph nodes.  Physical Exam: BP 140/74  Pulse 86  Temp(Src) 98.3 F (36.8 C) (Temporal)  Ht 5\' 7"  (1.702 m)  Wt 214 lb 9.6 oz (97.342 kg)  BMI 33.61 kg/m2 General:   Alert and oriented. No distress noted. Pleasant and cooperative.  Head:  Normocephalic and atraumatic. Eyes:  Conjuctiva clear without scleral icterus. Mouth:  Oral mucosa pink and moist. Good dentition. No lesions. Neck:  Supple, without mass or thyromegaly. Heart:  S1, S2 present without murmurs, rubs, or gallops.  Abdomen:  +BS, soft, non-tender and non-distended. No rebound or guarding. No HSM or masses noted. Msk:  Symmetrical without gross deformities. Normal posture. Extremities:  1+ edema, mild Neurologic:  Alert and  oriented x4;  grossly normal neurologically. Skin:  Intact without significant lesions or rashes. Cervical  Nodes:  No significant cervical adenopathy. Psych:  Alert and cooperative. Normal mood and affect.

## 2010-10-06 NOTE — Patient Instructions (Signed)
We will see you back in 1 year or sooner if needed.  Continue Prilosec every day forever.  Continue to stay away from Ibuprofen, Advil, Aleve, aspirin powders, etc.  Please have labs done and stool sample once you receive these in the mail.

## 2010-10-07 ENCOUNTER — Encounter: Payer: Self-pay | Admitting: Gastroenterology

## 2010-10-07 NOTE — Assessment & Plan Note (Addendum)
77 year old with superficial gastric ulcers noted on EGD in Aug of last year, doing extremely well. No abdominal pain, rare nausea she relates to stress at work. No dysphagia/odynophagia. No melena or brbpr. Purposeful wt loss, exercising at work. Continue Prilosec daily, f/u in 1 year or sooner if needed. Pt instructed to avoid NSAIDs indefinitely. As of note, pt's Hgb has been stable, but report of dark stools a few weeks ago, not mentioned above. Will recheck CBC, anemia panel, ifobt. Otherwise, 1 year f/u.

## 2010-10-07 NOTE — Assessment & Plan Note (Signed)
Last colonoscopy Sept 2011. Doing well currently, repeat 2016.

## 2010-10-07 NOTE — Progress Notes (Signed)
Cc to PCP 

## 2010-10-12 ENCOUNTER — Ambulatory Visit: Payer: Medicare HMO | Admitting: Internal Medicine

## 2010-10-12 DIAGNOSIS — D649 Anemia, unspecified: Secondary | ICD-10-CM

## 2010-10-14 NOTE — Progress Notes (Signed)
EGD/Bx-NO hp OR ca.

## 2010-10-14 NOTE — Progress Notes (Signed)
Pt returned ifobt and it was negative.  

## 2010-10-19 NOTE — Progress Notes (Signed)
Quick Note:  Tried to call pt- LM with female at home number. ______ 

## 2010-10-28 ENCOUNTER — Other Ambulatory Visit: Payer: Self-pay | Admitting: Gastroenterology

## 2010-10-29 LAB — FOLATE: Folate: 17.8 ng/mL

## 2010-10-29 LAB — IRON AND TIBC
TIBC: 296 ug/dL (ref 250–470)
UIBC: 211 ug/dL

## 2010-11-03 NOTE — Progress Notes (Signed)
Quick Note:  Labs look great. OV in 1 year. May send letter. ______

## 2011-03-08 DIAGNOSIS — I219 Acute myocardial infarction, unspecified: Secondary | ICD-10-CM

## 2011-03-08 HISTORY — PX: TOE AMPUTATION: SHX809

## 2011-03-08 HISTORY — DX: Acute myocardial infarction, unspecified: I21.9

## 2011-05-19 ENCOUNTER — Other Ambulatory Visit: Payer: Self-pay | Admitting: Family Medicine

## 2011-05-19 DIAGNOSIS — Z139 Encounter for screening, unspecified: Secondary | ICD-10-CM

## 2011-05-24 ENCOUNTER — Ambulatory Visit (HOSPITAL_COMMUNITY)
Admission: RE | Admit: 2011-05-24 | Discharge: 2011-05-24 | Disposition: A | Discharge status: Home / Self Care | Payer: Medicare HMO | Source: Ambulatory Visit | Attending: Family Medicine | Admitting: Family Medicine

## 2011-05-24 DIAGNOSIS — Z13820 Encounter for screening for osteoporosis: Secondary | ICD-10-CM | POA: Insufficient documentation

## 2011-05-24 DIAGNOSIS — Z1231 Encounter for screening mammogram for malignant neoplasm of breast: Secondary | ICD-10-CM | POA: Insufficient documentation

## 2011-05-24 DIAGNOSIS — Z139 Encounter for screening, unspecified: Secondary | ICD-10-CM

## 2011-05-24 DIAGNOSIS — Z780 Asymptomatic menopausal state: Secondary | ICD-10-CM | POA: Insufficient documentation

## 2011-05-30 LAB — HM MAMMOGRAPHY

## 2011-07-06 ENCOUNTER — Encounter (HOSPITAL_COMMUNITY): Payer: Self-pay | Admitting: Pharmacy Technician

## 2011-07-06 HISTORY — PX: CORONARY ANGIOPLASTY: SHX604

## 2011-07-19 ENCOUNTER — Encounter (HOSPITAL_COMMUNITY): Payer: Self-pay | Admitting: General Practice

## 2011-07-19 ENCOUNTER — Encounter (HOSPITAL_COMMUNITY)
Admission: RE | Disposition: A | Discharge status: Home / Self Care | Payer: Self-pay | Source: Ambulatory Visit | Attending: Cardiology

## 2011-07-19 ENCOUNTER — Ambulatory Visit (HOSPITAL_COMMUNITY)
Admission: RE | Admit: 2011-07-19 | Discharge: 2011-07-20 | Disposition: A | Discharge status: Home / Self Care | Payer: Medicare HMO | Source: Ambulatory Visit | Attending: Cardiology | Admitting: Cardiology

## 2011-07-19 DIAGNOSIS — I2582 Chronic total occlusion of coronary artery: Secondary | ICD-10-CM | POA: Insufficient documentation

## 2011-07-19 DIAGNOSIS — E119 Type 2 diabetes mellitus without complications: Secondary | ICD-10-CM | POA: Insufficient documentation

## 2011-07-19 DIAGNOSIS — E785 Hyperlipidemia, unspecified: Secondary | ICD-10-CM | POA: Insufficient documentation

## 2011-07-19 DIAGNOSIS — Z955 Presence of coronary angioplasty implant and graft: Secondary | ICD-10-CM

## 2011-07-19 DIAGNOSIS — I2510 Atherosclerotic heart disease of native coronary artery without angina pectoris: Secondary | ICD-10-CM

## 2011-07-19 DIAGNOSIS — I10 Essential (primary) hypertension: Secondary | ICD-10-CM | POA: Insufficient documentation

## 2011-07-19 HISTORY — DX: Unspecified osteoarthritis, unspecified site: M19.90

## 2011-07-19 HISTORY — PX: LEFT HEART CATHETERIZATION WITH CORONARY ANGIOGRAM: SHX5451

## 2011-07-19 HISTORY — DX: Malignant (primary) neoplasm, unspecified: C80.1

## 2011-07-19 HISTORY — PX: OTHER SURGICAL HISTORY: SHX169

## 2011-07-19 HISTORY — DX: Shortness of breath: R06.02

## 2011-07-19 HISTORY — DX: Atherosclerotic heart disease of native coronary artery without angina pectoris: I25.10

## 2011-07-19 HISTORY — DX: Headache: R51

## 2011-07-19 LAB — GLUCOSE, CAPILLARY
Glucose-Capillary: 127 mg/dL — ABNORMAL HIGH (ref 70–99)
Glucose-Capillary: 51 mg/dL — ABNORMAL LOW (ref 70–99)
Glucose-Capillary: 72 mg/dL (ref 70–99)

## 2011-07-19 SURGERY — LEFT HEART CATHETERIZATION WITH CORONARY ANGIOGRAM
Anesthesia: LOCAL

## 2011-07-19 MED ORDER — PRASUGREL HCL 10 MG PO TABS
ORAL_TABLET | ORAL | Status: AC
  Filled 2011-07-19: qty 6

## 2011-07-19 MED ORDER — DEXTROSE-NACL 5-0.45 % IV SOLN
INTRAVENOUS | Status: DC
  Administered 2011-07-19: 07:00:00 via INTRAVENOUS

## 2011-07-19 MED ORDER — INSULIN GLARGINE 100 UNIT/ML ~~LOC~~ SOLN
45.0000 [IU] | Freq: Every day | SUBCUTANEOUS | Status: DC
  Administered 2011-07-20: 45 [IU] via SUBCUTANEOUS

## 2011-07-19 MED ORDER — FENTANYL CITRATE 0.05 MG/ML IJ SOLN
INTRAMUSCULAR | Status: AC
  Filled 2011-07-19: qty 2

## 2011-07-19 MED ORDER — SODIUM CHLORIDE 0.9 % IV SOLN
1.0000 mL/kg/h | INTRAVENOUS | Status: AC
  Administered 2011-07-19: 1 mL/kg/h via INTRAVENOUS

## 2011-07-19 MED ORDER — LIDOCAINE HCL (PF) 1 % IJ SOLN
INTRAMUSCULAR | Status: AC
  Filled 2011-07-19: qty 30

## 2011-07-19 MED ORDER — ONDANSETRON HCL 4 MG/2ML IJ SOLN: 4.0000 mg | Freq: Four times a day (QID) | INTRAMUSCULAR | Status: DC | PRN

## 2011-07-19 MED ORDER — SODIUM CHLORIDE 0.9 % IV SOLN
1.7500 mg/kg/h | INTRAVENOUS | Status: DC
  Administered 2011-07-19: 1.75 mg/kg/h via INTRAVENOUS
  Filled 2011-07-19: qty 250

## 2011-07-19 MED ORDER — PRASUGREL HCL 10 MG PO TABS
10.0000 mg | ORAL_TABLET | Freq: Every day | ORAL | Status: DC
  Administered 2011-07-20: 10 mg via ORAL
  Filled 2011-07-19: qty 1

## 2011-07-19 MED ORDER — CYCLOBENZAPRINE HCL 10 MG PO TABS: 10.0000 mg | ORAL_TABLET | Freq: Every day | ORAL | Status: DC | PRN

## 2011-07-19 MED ORDER — MIDAZOLAM HCL 2 MG/2ML IJ SOLN
INTRAMUSCULAR | Status: AC
  Filled 2011-07-19: qty 2

## 2011-07-19 MED ORDER — ASPIRIN 81 MG PO CHEW
81.0000 mg | CHEWABLE_TABLET | Freq: Every day | ORAL | Status: DC
  Administered 2011-07-20: 81 mg via ORAL
  Filled 2011-07-19: qty 1

## 2011-07-19 MED ORDER — SODIUM CHLORIDE 0.9 % IV SOLN: 250.0000 mL | INTRAVENOUS | Status: DC | PRN

## 2011-07-19 MED ORDER — INSULIN GLARGINE 100 UNIT/ML ~~LOC~~ SOLN: 10.0000 [IU] | Freq: Two times a day (BID) | SUBCUTANEOUS | Status: DC

## 2011-07-19 MED ORDER — SODIUM CHLORIDE 0.9 % IV SOLN
INTRAVENOUS | Status: DC
  Administered 2011-07-19: 1000 mL via INTRAVENOUS

## 2011-07-19 MED ORDER — HEPARIN (PORCINE) IN NACL 2-0.9 UNIT/ML-% IJ SOLN
INTRAMUSCULAR | Status: AC
  Filled 2011-07-19: qty 2000

## 2011-07-19 MED ORDER — HEPARIN SODIUM (PORCINE) 1000 UNIT/ML IJ SOLN
INTRAMUSCULAR | Status: AC
  Filled 2011-07-19: qty 1

## 2011-07-19 MED ORDER — ASPIRIN 81 MG PO CHEW
324.0000 mg | CHEWABLE_TABLET | ORAL | Status: AC
  Administered 2011-07-19: 324 mg via ORAL

## 2011-07-19 MED ORDER — HYDROCHLOROTHIAZIDE 25 MG PO TABS
25.0000 mg | ORAL_TABLET | Freq: Every day | ORAL | Status: DC
  Administered 2011-07-20: 25 mg via ORAL
  Filled 2011-07-19 (×3): qty 1

## 2011-07-19 MED ORDER — DEXTROSE 50 % IV SOLN
INTRAVENOUS | Status: AC
  Administered 2011-07-19: 25 mL via INTRAVENOUS
  Filled 2011-07-19: qty 50

## 2011-07-19 MED ORDER — NITROGLYCERIN 0.2 MG/ML ON CALL CATH LAB
INTRAVENOUS | Status: AC
  Filled 2011-07-19: qty 1

## 2011-07-19 MED ORDER — ASPIRIN 81 MG PO CHEW
CHEWABLE_TABLET | ORAL | Status: AC
  Filled 2011-07-19: qty 4

## 2011-07-19 MED ORDER — ACETAMINOPHEN 500 MG PO TABS
1000.0000 mg | ORAL_TABLET | Freq: Two times a day (BID) | ORAL | Status: DC
  Administered 2011-07-19 – 2011-07-20 (×2): 1000 mg via ORAL
  Filled 2011-07-19 (×6): qty 2

## 2011-07-19 MED ORDER — SODIUM CHLORIDE 0.9 % IJ SOLN: 3.0000 mL | Freq: Two times a day (BID) | INTRAMUSCULAR | Status: DC

## 2011-07-19 MED ORDER — SODIUM CHLORIDE 0.9 % IJ SOLN: 3.0000 mL | INTRAMUSCULAR | Status: DC | PRN

## 2011-07-19 MED ORDER — SIMVASTATIN 20 MG PO TABS
20.0000 mg | ORAL_TABLET | Freq: Every evening | ORAL | Status: DC
  Filled 2011-07-19 (×3): qty 1

## 2011-07-19 MED ORDER — ACETAMINOPHEN 325 MG PO TABS: 650.0000 mg | ORAL_TABLET | ORAL | Status: DC | PRN

## 2011-07-19 MED ORDER — CARVEDILOL 3.125 MG PO TABS
3.1250 mg | ORAL_TABLET | Freq: Two times a day (BID) | ORAL | Status: DC
  Administered 2011-07-19 – 2011-07-20 (×2): 3.125 mg via ORAL
  Filled 2011-07-19 (×3): qty 1

## 2011-07-19 MED ORDER — BIVALIRUDIN 250 MG IV SOLR
INTRAVENOUS | Status: AC
  Filled 2011-07-19: qty 250

## 2011-07-19 MED ORDER — PANTOPRAZOLE SODIUM 40 MG PO TBEC
40.0000 mg | DELAYED_RELEASE_TABLET | Freq: Every day | ORAL | Status: DC
  Administered 2011-07-19: 40 mg via ORAL
  Filled 2011-07-19: qty 1

## 2011-07-19 MED ORDER — INSULIN GLARGINE 100 UNIT/ML ~~LOC~~ SOLN
10.0000 [IU] | Freq: Every day | SUBCUTANEOUS | Status: DC
  Administered 2011-07-19: 10 [IU] via SUBCUTANEOUS

## 2011-07-19 MED ORDER — FAMOTIDINE IN NACL 20-0.9 MG/50ML-% IV SOLN
INTRAVENOUS | Status: AC
  Filled 2011-07-19: qty 50

## 2011-07-19 NOTE — Progress Notes (Signed)
Pt arrived at Short Stay [facility] to register; told admitting she felt like her blood sugar was dropping;pale, slightly diaphoretic, nauseated;  pt placed in W/C and brought to Sec C; IV started; CBG 51; 25 ml D5W IV given

## 2011-07-19 NOTE — H&P (Signed)
  Please see paper chart  

## 2011-07-19 NOTE — Interval H&P Note (Signed)
History and Physical Interval Note:  07/19/2011 7:41 AM  Claudia [name]  has presented today for surgery, with the diagnosis of Chest pain  The various methods of treatment have been discussed with the patient and family. After consideration of risks, benefits and other options for treatment, the patient has consented to  Procedure(s) (LRB): LEFT HEART CATHETERIZATION WITH CORONARY ANGIOGRAM (N/A) as a surgical intervention .  The patients' history has been reviewed, patient examined, no change in status, stable for surgery.  I have reviewed the patients' chart and labs.  Questions were answered to the patient's satisfaction.     [name]

## 2011-07-19 NOTE — Progress Notes (Signed)
CBG 127; pt asymtomatic without c/o

## 2011-07-19 NOTE — CV Procedure (Signed)
Procedure performed:  1. Left heart catheterization including hemodynamic monitoring of the left ventricle, selective right and left coronary arteriography. 2. PTCA and stenting of the proximal and mid circumflex coronary artery with implantation of a 2.5 x 24 mm Promus drug-eluting stent. 3. PTCA and stenting of the proximal and midsegment of the ramus intermediate coronary artery with implantation of a 2.5 x 26 mm resolute drug-eluting stent. 4.  Patient has residual occluded right coronary artery is severely diffusely diseased and has collaterals from the left system. Unless intractable angina, then we'll consider angioplasty of the CTO.    Indication patient is a 78 year-old woman with history of hypertension,  hyperlipidemia,  Diabetes Mellitus   who presents with chest pain. Patient has  had non invasive testing which was abnormal.  Hence is brought to the cardiac catheterization lab to evaluate her coronary anatomy for definitive diagnosis of CAD.  Hemodynamic data:  Left ventricular pressure was 133/4 with LVEDP of 0 mm mercury. Aortic pressure was 136/52 with a mean of 86 mm mercury.  Left ventricle: Not performed   Right coronary artery: The vessel is Dominant subtotal occlusion in proximal segment and chronically totally occluded distally with faint collaterals from left coronary arteries.  Left main coronary artery is large and normal. Mildly calcified.  Circumflex coronary artery: A large vessel giving origin to a large obtuse marginal 1. The mid circumflex is a 90% stenoses. Ostium of the circumflex shows a 30% stenoses and is calcified and hazy.  Ramus intermediate: Large vessel with severe diffuse disease in the proximal segment constituting 80-90% stenoses. Mild calcification is evident.  LAD:  LAD gives origin to several small diagonals.   LAD has mild luminal irregularities. Midsegment of the LAD shows a calcified hazy 40% stenoses.  Technique: Under sterile precautions  using a 6 French right radial  arterial access, a 6 French sheath was introduced into the right radial artery. A 5 [place] Tig 4 catheter was advanced into the ascending aorta selective  right coronary artery and left coronary artery was cannulated and angiography was performed in multiple views. The catheter was pulled back Out of the body over exchange length J-wire.  Same Catheter was used to perform LV hemodynamics.  Catheter exchanged out of the body over J-Wire. NO immediate complications noted. Patient tolerated the procedure well.     Technique of intervention:  Using a 6 [place] XB 3.0 with side holes guide catheter the LM  coronary  was selected and cannulated. Using Angiomax for anticoagulation, I utilized a Couger guidewire and across the Circumflex stenosis coronary artery without any difficulty. I placed the tip of the wire into the distal  coronary artery. Angiography was performed.   Then I utilized a 2.5x20 [place] Gl[place] Quantum balloon, I performed balloon angioplasty at 12 atmospheric pressure x 2 for 50 seconds each.   I proceeded with implantation of a 2.5 x 24 mm mm Promus  drug-eluting stent into the proximal and mid circumflex coronary artery. The stent was deployed at 10 atmospheric pressure for 30  seconds. The stent was then post dilated with a 2.5 x20 mm [place] Quantum balloon 10 and 16 atmospheric pressures for 30 seconds each. Post-balloon angioplasty results were excellent with 0% residual stenoses and TIMI-3 flow was maintained. There was no evidence of edge dissection. Attention was directed towards the ramus intermediate branch. I utilized the same guidewire and across the stenosis with moderate amount of difficulty and placed the tip wire in the distal bed. I  utilized the same noncompliant balloon to perform predilatation and 13 atmospheric pressures for 45 seconds followed by implantation of a 2.5 x 26 mm resolute drug-eluting stent which was deployed at 8 atmospheric pressures for  50 seconds followed by post dilatation with a 2.5 x 20 mm [place] Quantum balloon at 16 atmospheric pressure for 40 seconds. Following this angiography revealed excellent results. During the procedure intracoronary nitroglycerin was administered to venous stasis.  The guidewire was withdrawn out of the body and the guide catheter was engaged and pulled out of the body over the J-wire the was no immediate complication. Patient tolerated the procedure well.  Disposition: Patient will be discharged in am unless complications with out-patient follow up. Will need Dual antiplatelet therapy with Effient and ASA 81 mg for at least 12 months and ASA indefinitely. Patient has residual occluded right coronary artery is severely diffusely diseased and has collaterals from the left system. Unless intractable angina, then we'll consider angioplasty of the CTO.

## 2011-07-20 DIAGNOSIS — I2510 Atherosclerotic heart disease of native coronary artery without angina pectoris: Secondary | ICD-10-CM

## 2011-07-20 LAB — CBC
HCT: 33.7 % — ABNORMAL LOW (ref 36.0–46.0)
Hemoglobin: 11.4 g/dL — ABNORMAL LOW (ref 12.0–15.0)
MCH: 30.9 pg (ref 26.0–34.0)
MCHC: 33.8 g/dL (ref 30.0–36.0)
MCV: 91.3 fL (ref 78.0–100.0)
Platelets: 259 10*3/uL (ref 150–400)
RBC: 3.69 MIL/uL — ABNORMAL LOW (ref 3.87–5.11)
RDW: 12.9 % (ref 11.5–15.5)
WBC: 6.5 10*3/uL (ref 4.0–10.5)

## 2011-07-20 LAB — BASIC METABOLIC PANEL
BUN: 30 mg/dL — ABNORMAL HIGH (ref 6–23)
Chloride: 107 mEq/L (ref 96–112)
Creatinine, Ser: 1.15 mg/dL — ABNORMAL HIGH (ref 0.50–1.10)
GFR calc Af Amer: 52 mL/min — ABNORMAL LOW (ref >90)
GFR calc non Af Amer: 45 mL/min — ABNORMAL LOW (ref >90)
Glucose, Bld: 92 mg/dL (ref 70–99)

## 2011-07-20 LAB — GLUCOSE, CAPILLARY: Glucose-Capillary: 172 mg/dL — ABNORMAL HIGH (ref 70–99)

## 2011-07-20 MED ORDER — PRASUGREL HCL 10 MG PO TABS: 10.0000 mg | ORAL_TABLET | Freq: Every day | ORAL | Status: DC

## 2011-07-20 MED ORDER — ASPIRIN 81 MG PO CHEW: 81.0000 mg | CHEWABLE_TABLET | Freq: Every day | ORAL | Status: DC

## 2011-07-20 MED ORDER — LISINOPRIL 10 MG PO TABS: 10.0000 mg | ORAL_TABLET | Freq: Every day | ORAL | Status: DC

## 2011-07-20 MED ORDER — CARVEDILOL 3.125 MG PO TABS: 3.1250 mg | ORAL_TABLET | Freq: Two times a day (BID) | ORAL | Status: DC

## 2011-07-20 MED FILL — Dextrose Inj 5%: INTRAVENOUS | Qty: 50 | Status: AC

## 2011-07-20 MED FILL — Nicardipine HCl IV Soln 2.5 MG/ML: INTRAVENOUS | Qty: 1 | Status: AC

## 2011-07-20 NOTE — Discharge Summary (Signed)
Physician Discharge Summary  Patient ID: Claudia [name] MRN: [id number] DOB/AGE: 09/03/1931 78 y.o.  Admit date: 07/19/2011 Discharge date: 07/20/2011  Primary Discharge Diagnosis 1. CAD. 2. PTCA and stenting of the proximal and mid circumflex coronary artery with implantation of a 2.5 x 24 mm Promus drug-eluting stent.  3. PTCA and stenting of the proximal and midsegment of the ramus intermediate coronary artery with implantation of a 2.5 x 26 mm resolute drug-eluting stent. Both performed 07/19/11.   Secondary Discharge Diagnosis Hypertension. Hyperlipidemia Diabetes Mellitus II controlled  Significant Diagnostic Studies: Left heart catheterization including hemodynamic monitoring of the left ventricle, selective right and left coronary arteriography. PTCA   Consults:   Hospital Course: patient was admitted to the hospital for elective coronary angiography for chest pain. She underwent successful angioplasty as detailed above. She did well overnight in the hospital without any significant lab abnormalities. She did not have any chest pain. Felt stable for discharge the following day.   Discharge Exam: Blood pressure 151/46, pulse 68, temperature 98.1 F (36.7 C), temperature source Oral, resp. rate 17, height 5\' 6"  (1.676 m), weight 93.6 kg (206 lb 5.6 oz), SpO2 99.00%.    General appearance: alert, cooperative, appears stated age and no distress Resp: clear to auscultation bilaterally Cardio: regular rate and rhythm, S1, S2 normal, no murmur, click, rub or gallop Extremities: extremities normal, atraumatic, no cyanosis or edema Neurologic: Alert and oriented X 3, normal strength and tone. Normal symmetric reflexes. Normal coordination and gait Right radial site without hematoma. Good pulse.  Labs:     Lab Results  Component Value Date   WBC 6.5 07/20/2011   HGB 11.4* 07/20/2011   HCT 33.7* 07/20/2011   MCV 91.3 07/20/2011   PLT 259 07/20/2011    Lab 07/20/11 0358  NA  140  K 3.8  CL 107  CO2 25  BUN 30*  CREATININE 1.15*  CALCIUM 9.2  PROT --  BILITOT --  ALKPHOS --  ALT --  AST --  GLUCOSE 92    QMV:HQIONG sinus rhythm, normal axis, normal intervals. Occasional atrial ectopics.  FOLLOW UP PLANS AND APPOINTMENTS  Medication List  As of 07/20/2011  8:04 AM   TAKE these medications         acetaminophen 500 MG tablet   Commonly known as: TYLENOL   Take 1,000 mg by mouth 2 (two) times daily. For pain      aspirin 81 MG chewable tablet   Chew 1 tablet (81 mg total) by mouth daily.      carvedilol 3.125 MG tablet   Commonly known as: COREG   Take 1 tablet (3.125 mg total) by mouth 2 (two) times daily with a meal.      cyclobenzaprine 10 MG tablet   Commonly known as: FLEXERIL   Take 10 mg by mouth daily as needed. For muscle spasms      fish oil-omega-3 fatty acids 1000 MG capsule   Take 1 g by mouth daily.      hydrochlorothiazide 25 MG tablet   Commonly known as: HYDRODIURIL   Take 25 mg by mouth daily.      LANTUS 100 UNIT/ML injection   Generic drug: insulin glargine   Inject 10-45 Units into the skin 2 (two) times daily. Inject 45 units in AM and 10 units in PM      lisinopril 10 MG tablet   Commonly known as: PRINIVIL,ZESTRIL   Take 1 tablet (10 mg total) by mouth daily.  omeprazole 20 MG capsule   Commonly known as: PRILOSEC   Take 20 mg by mouth daily.      prasugrel 10 MG Tabs   Commonly known as: EFFIENT   Take 1 tablet (10 mg total) by mouth daily.      simvastatin 20 MG tablet   Commonly known as: ZOCOR   Take 20 mg by mouth every evening.           Follow-up Information    Follow up with [name], MD. (Keep the previous appointment)    Contact information:   [address]  [place] [address] [phone]           [name], MD 07/20/2011, 8:04 AM

## 2011-07-20 NOTE — Progress Notes (Signed)
CARDIAC REHAB PHASE I   PRE:  Rate/Rhythm: 67SR  BP:  Supine: 135/48  Sitting:   Standing:    SaO2:   MODE:  Ambulation: 340 ft   POST:  Rate/Rhythem: 98SR  BP:  Supine:   Sitting: 147/51  Standing:    SaO2:  0725-0835 Pt walked 340 ft on RA with fairly steady gait. States wobbly at times. Denied chest pain. Tolerated well. To recliner after walk. Education completed. Son with many diet questions. Stressed importance to adhering to diabetic diet and keeping sugars under control. Pt gave permission to refer to [facility] [place] Phase 2.  Duanne Limerick

## 2011-07-20 NOTE — Care Management Note (Signed)
    Page 1 of 1   07/20/2011     9:46:07 AM   CARE MANAGEMENT NOTE 07/20/2011  Patient:  Claudia [name], Claudia [name]   Account Number:  [id number]  Date Initiated:  07/20/2011  Documentation initiated by:  Alvira Philips Assessment:   78 yr-old female adm with dx of chest pain         DC Planning Services  CM consult  Medication Assistance      Status of service:  Completed, signed off  Discharge Disposition:  HOME/SELF CARE  Comments:  07/20/11 0940 Viveca Beckstrom RN MSN CCM Pt to d/c on Effient 10 mg qd.  Pt has card for free 30-day supply, her pharmacy, [facility] in [place], has Effient in stock.

## 2011-07-20 NOTE — Discharge Instructions (Signed)
Lower using his right hand for any activities for 2 days. Patient not to return to work for 10 days.

## 2011-07-28 ENCOUNTER — Encounter (HOSPITAL_COMMUNITY): Payer: Self-pay | Admitting: Emergency Medicine

## 2011-07-28 ENCOUNTER — Inpatient Hospital Stay (HOSPITAL_COMMUNITY)
Admission: EM | Admit: 2011-07-28 | Discharge: 2011-07-30 | DRG: 641 | Disposition: A | Discharge status: Home / Self Care | Payer: Medicare HMO | Attending: Internal Medicine | Admitting: Internal Medicine

## 2011-07-28 ENCOUNTER — Emergency Department (HOSPITAL_COMMUNITY): Payer: Medicare HMO

## 2011-07-28 DIAGNOSIS — E871 Hypo-osmolality and hyponatremia: Secondary | ICD-10-CM

## 2011-07-28 DIAGNOSIS — R112 Nausea with vomiting, unspecified: Secondary | ICD-10-CM | POA: Diagnosis present

## 2011-07-28 DIAGNOSIS — K219 Gastro-esophageal reflux disease without esophagitis: Secondary | ICD-10-CM | POA: Diagnosis present

## 2011-07-28 DIAGNOSIS — Z794 Long term (current) use of insulin: Secondary | ICD-10-CM

## 2011-07-28 DIAGNOSIS — E119 Type 2 diabetes mellitus without complications: Secondary | ICD-10-CM | POA: Diagnosis present

## 2011-07-28 DIAGNOSIS — Z8711 Personal history of peptic ulcer disease: Secondary | ICD-10-CM

## 2011-07-28 DIAGNOSIS — R197 Diarrhea, unspecified: Secondary | ICD-10-CM | POA: Diagnosis present

## 2011-07-28 DIAGNOSIS — I2510 Atherosclerotic heart disease of native coronary artery without angina pectoris: Secondary | ICD-10-CM | POA: Diagnosis present

## 2011-07-28 DIAGNOSIS — E7800 Pure hypercholesterolemia, unspecified: Secondary | ICD-10-CM | POA: Diagnosis present

## 2011-07-28 DIAGNOSIS — I10 Essential (primary) hypertension: Secondary | ICD-10-CM

## 2011-07-28 LAB — CARDIAC PANEL(CRET KIN+CKTOT+MB+TROPI)
Relative Index: 3 — ABNORMAL HIGH (ref 0.0–2.5)
Total CK: 246 U/L — ABNORMAL HIGH (ref 7–177)
Troponin I: <0.3 ng/mL (ref <0.30)

## 2011-07-28 LAB — CBC
HCT: 32.3 % — ABNORMAL LOW (ref 36.0–46.0)
Hemoglobin: 12 g/dL (ref 12.0–15.0)
MCV: 82.2 fL (ref 78.0–100.0)
RBC: 3.93 MIL/uL (ref 3.87–5.11)
WBC: 9.5 10*3/uL (ref 4.0–10.5)

## 2011-07-28 LAB — DIFFERENTIAL
Basophils Absolute: 0 10*3/uL (ref 0.0–0.1)
Eosinophils Relative: 0 % (ref 0–5)
Lymphocytes Relative: 9 % — ABNORMAL LOW (ref 12–46)
Lymphs Abs: 0.9 10*3/uL (ref 0.7–4.0)
Monocytes Absolute: 0.6 10*3/uL (ref 0.1–1.0)
Monocytes Relative: 6 % (ref 3–12)
Neutro Abs: 8 10*3/uL — ABNORMAL HIGH (ref 1.7–7.7)

## 2011-07-28 LAB — URINALYSIS, ROUTINE W REFLEX MICROSCOPIC
Glucose, UA: 500 mg/dL — AB
Ketones, ur: NEGATIVE mg/dL
Leukocytes, UA: NEGATIVE
Protein, ur: 100 mg/dL — AB
Urobilinogen, UA: 0.2 mg/dL (ref 0.0–1.0)

## 2011-07-28 LAB — COMPREHENSIVE METABOLIC PANEL
AST: 24 U/L (ref 0–37)
BUN: 18 mg/dL (ref 6–23)
CO2: 24 mEq/L (ref 19–32)
Chloride: 76 mEq/L — ABNORMAL LOW (ref 96–112)
Creatinine, Ser: 0.8 mg/dL (ref 0.50–1.10)
GFR calc Af Amer: 80 mL/min — ABNORMAL LOW (ref >90)
GFR calc non Af Amer: 69 mL/min — ABNORMAL LOW (ref >90)
Glucose, Bld: 228 mg/dL — ABNORMAL HIGH (ref 70–99)
Total Bilirubin: 1 mg/dL (ref 0.3–1.2)

## 2011-07-28 LAB — TSH: TSH: 2.034 u[IU]/mL (ref 0.350–4.500)

## 2011-07-28 LAB — GLUCOSE, CAPILLARY: Glucose-Capillary: 173 mg/dL — ABNORMAL HIGH (ref 70–99)

## 2011-07-28 MED ORDER — INSULIN ASPART 100 UNIT/ML ~~LOC~~ SOLN
0.0000 [IU] | Freq: Three times a day (TID) | SUBCUTANEOUS | Status: DC
  Administered 2011-07-28: 3 [IU] via SUBCUTANEOUS
  Administered 2011-07-28 – 2011-07-29 (×2): 2 [IU] via SUBCUTANEOUS
  Administered 2011-07-30: 3 [IU] via SUBCUTANEOUS

## 2011-07-28 MED ORDER — ONDANSETRON HCL 4 MG/2ML IJ SOLN
4.0000 mg | Freq: Four times a day (QID) | INTRAMUSCULAR | Status: DC | PRN
  Administered 2011-07-28 (×2): 4 mg via INTRAVENOUS
  Filled 2011-07-28 (×2): qty 2

## 2011-07-28 MED ORDER — SODIUM CHLORIDE 0.9 % IV BOLUS (SEPSIS)
500.0000 mL | Freq: Once | INTRAVENOUS | Status: AC
  Administered 2011-07-28: 500 mL via INTRAVENOUS

## 2011-07-28 MED ORDER — ACETAMINOPHEN 325 MG PO TABS: 650.0000 mg | ORAL_TABLET | Freq: Four times a day (QID) | ORAL | Status: DC | PRN

## 2011-07-28 MED ORDER — ONDANSETRON HCL 4 MG/2ML IJ SOLN
4.0000 mg | Freq: Once | INTRAMUSCULAR | Status: AC
  Administered 2011-07-28: 4 mg via INTRAVENOUS
  Filled 2011-07-28: qty 2

## 2011-07-28 MED ORDER — PROMETHAZINE HCL 25 MG/ML IJ SOLN
6.2500 mg | Freq: Four times a day (QID) | INTRAMUSCULAR | Status: DC | PRN
Start: 2011-07-28 — End: 2011-07-30
  Administered 2011-07-28: 6.25 mg via INTRAVENOUS
  Filled 2011-07-28: qty 1

## 2011-07-28 MED ORDER — ASPIRIN 81 MG PO CHEW
81.0000 mg | CHEWABLE_TABLET | Freq: Every day | ORAL | Status: DC
  Administered 2011-07-28 – 2011-07-30 (×3): 81 mg via ORAL
  Filled 2011-07-28 (×3): qty 1

## 2011-07-28 MED ORDER — ONDANSETRON HCL 4 MG PO TABS: 4.0000 mg | ORAL_TABLET | Freq: Four times a day (QID) | ORAL | Status: DC | PRN

## 2011-07-28 MED ORDER — CARVEDILOL 3.125 MG PO TABS
6.2500 mg | ORAL_TABLET | Freq: Two times a day (BID) | ORAL | Status: DC
  Administered 2011-07-28 – 2011-07-30 (×5): 6.25 mg via ORAL
  Filled 2011-07-28: qty 2
  Filled 2011-07-28 (×2): qty 1
  Filled 2011-07-28 (×3): qty 2

## 2011-07-28 MED ORDER — HYDROMORPHONE HCL PF 1 MG/ML IJ SOLN
0.5000 mg | Freq: Once | INTRAMUSCULAR | Status: AC
  Administered 2011-07-28: 0.5 mg via INTRAVENOUS
  Filled 2011-07-28: qty 1

## 2011-07-28 MED ORDER — INSULIN ASPART 100 UNIT/ML ~~LOC~~ SOLN: 0.0000 [IU] | Freq: Every day | SUBCUTANEOUS | Status: DC

## 2011-07-28 MED ORDER — ALUM & MAG HYDROXIDE-SIMETH 200-200-20 MG/5ML PO SUSP: 30.0000 mL | Freq: Four times a day (QID) | ORAL | Status: DC | PRN

## 2011-07-28 MED ORDER — SODIUM CHLORIDE 0.9 % IV SOLN
INTRAVENOUS | Status: DC
  Administered 2011-07-28 – 2011-07-29 (×3): via INTRAVENOUS

## 2011-07-28 MED ORDER — MORPHINE SULFATE 2 MG/ML IJ SOLN: 1.0000 mg | INTRAMUSCULAR | Status: DC | PRN

## 2011-07-28 MED ORDER — ACETAMINOPHEN 650 MG RE SUPP: 650.0000 mg | Freq: Four times a day (QID) | RECTAL | Status: DC | PRN

## 2011-07-28 MED ORDER — INSULIN GLARGINE 100 UNIT/ML ~~LOC~~ SOLN
10.0000 [IU] | Freq: Every day | SUBCUTANEOUS | Status: DC
  Administered 2011-07-28 – 2011-07-30 (×3): 10 [IU] via SUBCUTANEOUS

## 2011-07-28 MED ORDER — PRASUGREL HCL 10 MG PO TABS
10.0000 mg | ORAL_TABLET | Freq: Every day | ORAL | Status: DC
  Administered 2011-07-28 – 2011-07-30 (×3): 10 mg via ORAL
  Filled 2011-07-28 (×5): qty 1

## 2011-07-28 NOTE — ED Notes (Signed)
Pt just noted some chest pain that is lower mid sternal area.

## 2011-07-28 NOTE — ED Notes (Signed)
Patient to [facility] to obtain urine sample, family at bedside to assist patient.

## 2011-07-28 NOTE — ED Notes (Signed)
Report given to Judy, RN unit 300. Ready to receive patient.  

## 2011-07-28 NOTE — H&P (Signed)
Hospital Admission Note Date: 07/28/2011  Patient name: Claudia [name] Medical record number: [id number] Date of birth: 11/15/1931 Age: 78 y.o. Gender: female PCP: Leo Grosser, MD, MD  Attending physician: Christiane Ha, MD  Chief Complaint: Vomiting  History of Present Illness:  Claudia [name] is an 78 y.o. female with multiple medical problems who presented with intractable vomiting for several days. She had a single episode of diarrhea a few days ago. No sick contacts. No recent travel or suspect food. No abdominal pain. No hematemesis. No melena. She also has been feeling sleepy and weak. She is on hydrochlorothiazide. She had a cardiac catheterization with stents placed by Dr. Jacinto Halim earlier this month. She is on effient. Also aspirin. Denies chest pain or shortness of breath. Her sodium was normal during the last hospitalization. She has a previous history of hyponatremia. In reviewing records, it appears that her episodes of hyponatremia seemed to coincide with GI symptoms. She's been hospitalized for vomiting and hyponatremia several times in the past. She has a history of diabetes and peptic ulcer disease. Also gastroesophageal reflux disease. She has seen Dr. Renard Matter in the past but now sees Dr. Tanya Nones.  Past Medical History  Diagnosis Date  . GERD (gastroesophageal reflux disease)   . PUD (peptic ulcer disease)   . Anemia   . Hypercholesterolemia   . Hypertension   . S/P endoscopy Aug 2011    3 superficial gastric ulcers, NSAID-induced  . S/P colonoscopy Sept 2011    left-sided diverticula, tubular adenoma  . Coronary artery disease   . Shortness of breath   . Diabetes mellitus     insulin dependent  . Headache     rare migraines  . Cancer     hx of skin cancer  . Arthritis     Meds: Prescriptions prior to admission  Medication Sig Dispense Refill  . acetaminophen (TYLENOL) 500 MG tablet Take 1,000 mg by mouth 2 (two) times daily. For pain      . aspirin  81 MG chewable tablet Chew 1 tablet (81 mg total) by mouth daily.      . carvedilol (COREG) 6.25 MG tablet Take 6.25 mg by mouth 2 (two) times daily.      . cyclobenzaprine (FLEXERIL) 10 MG tablet Take 10 mg by mouth every 8 (eight) hours as needed. For muscle spasms      . LANTUS 100 UNIT/ML injection Inject 10-45 Units into the skin 2 (two) times daily. Inject 45 units in AM and 10 units in PM      . lisinopril-hydrochlorothiazide (PRINZIDE,ZESTORETIC) 20-25 MG per tablet Take 1 tablet by mouth daily.      . Omega-3 Fatty Acids (FISH OIL) 1200 MG CAPS Take 1 capsule by mouth 2 (two) times daily.      Marland Kitchen omeprazole (PRILOSEC) 20 MG capsule Take 20 mg by mouth daily.        . prasugrel (EFFIENT) 10 MG TABS Take 1 tablet (10 mg total) by mouth daily.  30 tablet  0  . simvastatin (ZOCOR) 20 MG tablet Take 20 mg by mouth every evening.        Allergies: Review of patient's allergies indicates no active allergies. History   Social History  . Marital Status: Widowed    Spouse Name: N/A    Number of Children: N/A  . Years of Education: N/A   Occupational History  . Not on file.   Social History Main Topics  . Smoking status: Never Smoker   .  Smokeless tobacco: Never Used  . Alcohol Use: No  . Drug Use: No  . Sexually Active: No   Other Topics Concern  . Not on file   Social History Narrative  . No narrative on file   History reviewed. No pertinent family history. Past Surgical History  Procedure Date  . Tonsillectomy   . Appendectomy   . Eye surgery     cataracts  . Cardiac stents 07/19/2011  . Cardiac catheterization 07/19/2011    Review of Systems: Systems reviewed and as per HPI, otherwise negative.  Physical Exam: Blood pressure 161/64, pulse 88, temperature 98.1 F (36.7 C), temperature source Oral, resp. rate 20, height 5\' 5"  (1.651 m), weight 93.441 kg (206 lb), SpO2 96.00%. BP 132/67  Pulse 64  Temp(Src) 98.2 F (36.8 C) (Oral)  Resp 20  Ht 5\' 5"  (1.651 m)   Wt 93.441 kg (206 lb)  BMI 34.28 kg/m2  SpO2 98%  General Appearance:    slightly groggy overweight white female with an emesis basin at her side., appears stated age  Head:    Normocephalic, without obvious abnormality, atraumatic  Eyes:    PERRL, conjunctiva/corneas clear, EOM's intact, fundi    benign, both eyes  Ears:    Normal TM's and external ear canals, both ears  Nose:   Nares normal, septum midline, mucosa normal, no drainage    or sinus tenderness  Throat:   slightly dry mucous membranes. No thrush.   Neck:   Supple, symmetrical, trachea midline, no adenopathy;    thyroid:  no enlargement/tenderness/nodules; no carotid   bruit or JVD  Back:     Symmetric, no curvature, ROM normal, no CVA tenderness  Lungs:     Clear to auscultation bilaterally, respirations unlabored  Chest Wall:    No tenderness or deformity   Heart:    Regular rate and rhythm, S1 and S2 normal, no murmur, rub   or gallop     Abdomen:     Soft, non-tender, bowel sounds active all four quadrants,    no masses, no organomegaly  Genitalia:   deferred   Rectal:   deferred   Extremities:   Extremities normal, atraumatic, no cyanosis or edema  Pulses:   2+ and symmetric all extremities  Skin:   Skin color, texture, turgor normal, no rashes or lesions  Lymph nodes:   Cervical, supraclavicular, and axillary nodes normal  Neurologic:   CNII-XII intact, normal strength, sensation and reflexes    throughout     Psychiatric: Normal affect.  Lab results: Basic Metabolic Panel:  Basename 07/28/11 0739  NA 111*  K 4.0  CL 76*  CO2 24  GLUCOSE 228*  BUN 18  CREATININE 0.80  CALCIUM 9.6  MG --  PHOS --   Liver Function Tests:  [facility] 07/28/11 0739  AST 24  ALT 15  ALKPHOS 112  BILITOT 1.0  PROT 7.6  ALBUMIN 3.7    Basename 07/28/11 0739  LIPASE 22  AMYLASE --   No results found for this basename: AMMONIA:2 in the last 72 hours CBC:  Basename 07/28/11 0739  WBC 9.5  NEUTROABS 8.0*  HGB  12.0  HCT 32.3*  MCV 82.2  PLT 259   Cardiac Enzymes:  Basename 07/28/11 0739  CKTOTAL 246*  CKMB 7.3*  CKMBINDEX --  TROPONINI <0.30   BNP: No results found for this basename: PROBNP:3 in the last 72 hours D-Dimer: No results found for this basename: DDIMER:2 in the last 72 hours CBG:  Basename 07/28/11 1645 07/28/11 1137  GLUCAP 173* 202*   Hemoglobin A1C:  Basename 07/28/11 0739  HGBA1C 7.4*   Fasting Lipid Panel: No results found for this basename: CHOL,HDL,LDLCALC,TRIG,CHOLHDL,LDLDIRECT in the last 72 hours Thyroid Function Tests:  Basename 07/28/11 0739  TSH 2.034  T4TOTAL --  [name]4 --  T3FREE --  THYROIDAB --   Anemia Panel: No results found for this basename: VITAMINB12,FOLATE,FERRITIN,TIBC,IRON,RETICCTPCT in the last 72 hours Coagulation: No results found for this basename: LABPROT:2,INR:2 in the last 72 hours Urine Drug Screen: Drugs of Abuse  No results found for this basename: labopia, cocainscrnur, labbenz, amphetmu, thcu, labbarb    Alcohol Level: No results found for this basename: ETH:2 in the last 72 hours Urinalysis:  Basename 07/28/11 0827  COLORURINE YELLOW  LABSPEC 1.015  PHURINE 7.0  GLUCOSEU 500*  HGBUR SMALL*  BILIRUBINUR NEGATIVE  KETONESUR NEGATIVE  PROTEINUR 100*  UROBILINOGEN 0.2  NITRITE NEGATIVE  LEUKOCYTESUR NEGATIVE    Imaging results:  Dg Abd Acute W/chest  07/28/2011  *RADIOLOGY REPORT*  Clinical Data: Nausea and vomiting  ACUTE ABDOMEN SERIES (ABDOMEN 2 VIEW & CHEST 1 VIEW)  Comparison: None.  Findings: Normal mediastinum and cardiac silhouette.  Costophrenic angles are clear.  No effusion, infiltrate, pneumothorax.  No dilated large or small bowel.  There is gas in the rectum. Cholecystectomy clips noted.  No intraperitoneal free air.  IMPRESSION:  1.  No acute cardiopulmonary process. 2.  No evidence of bowel obstruction or intraperitoneal free air.  Original Report Authenticated By: Genevive Bi, M.D.     Assessment & Plan: Principal Problem:  *Hyponatremia, hypovolemic, recurrent. Patient is on thiazide which I will stop. Give saline. Check TSH and free cortisol Active Problems:  Nausea and vomiting, question gastroenteritis. Her abdomen is soft and nontender. Lipase and liver function tests normal. Will give Zofran as needed. IV protonix.  Benign hypertension  DM (diabetes mellitus), type 2: Decrease Lantus for now.  GERD  CAD (coronary artery disease), native coronary artery, stable. Will check a set of cardiac enzymes to rule out anginal equivalent.   Bryley Kovacevic L 07/28/2011, 10:22 PM

## 2011-07-28 NOTE — ED Provider Notes (Signed)
History   This chart was scribed for Donnetta Hutching, MD by Clarita Crane. The patient was seen in room APA06/APA06. Patient's care was started at 0641.    CSN: [id number]  Arrival date & time 07/28/11  1478   First MD Initiated Contact with Patient 07/28/11 [phone]      Chief Complaint  Patient presents with  . Emesis    (Consider location/radiation/quality/duration/timing/severity/associated sxs/prior treatment) HPI Claudia [name] is a 78 y.o. female who presents to the Emergency Department complaining of constant moderate to severe nausea and vomiting with associated periumbilical abdominal pain onset 4 days ago and persistent since. Patient states she has experienced similar symptoms previously which were related to decreased sodium levels. Denies chest pain, SOB, diaphoresis. Patient with h/o GERD, anemia, HTN, hypercholesterolemia, CAD, diabetes, CA, appendectomy, 2 cardiac stents placed performed 2 weeks ago.   PCP- Tanya Nones  Past Medical History  Diagnosis Date  . GERD (gastroesophageal reflux disease)   . PUD (peptic ulcer disease)   . Anemia   . Hypercholesterolemia   . Hypertension   . S/P endoscopy Aug 2011    3 superficial gastric ulcers, NSAID-induced  . S/P colonoscopy Sept 2011    left-sided diverticula, tubular adenoma  . Coronary artery disease   . Shortness of breath   . Diabetes mellitus     insulin dependent  . Headache     rare migraines  . Cancer     hx of skin cancer  . Arthritis     Past Surgical History  Procedure Date  . Tonsillectomy   . Appendectomy   . Eye surgery     cataracts  . Cardiac stents 07/19/2011  . Cardiac catheterization 07/19/2011    History reviewed. No pertinent family history.  History  Substance Use Topics  . Smoking status: Never Smoker   . Smokeless tobacco: Never Used  . Alcohol Use: No    OB History    Grav Para Term Preterm Abortions TAB SAB Ect Mult Living                  Review of Systems A complete 10  system review of systems was obtained and all systems are negative except as noted in the HPI and PMH.   Allergies  Aspirin  Home Medications   Current Outpatient Rx  Name Route Sig Dispense Refill  . ACETAMINOPHEN 500 MG PO TABS Oral Take 1,000 mg by mouth 2 (two) times daily. For pain    . ASPIRIN 81 MG PO CHEW Oral Chew 1 tablet (81 mg total) by mouth daily.    Marland Kitchen CARVEDILOL 3.125 MG PO TABS Oral Take 1 tablet (3.125 mg total) by mouth 2 (two) times daily with a meal. 60 tablet 6  . CYCLOBENZAPRINE HCL 10 MG PO TABS Oral Take 10 mg by mouth daily as needed. For muscle spasms    . OMEGA-3 FATTY ACIDS 1000 MG PO CAPS Oral Take 1 g by mouth daily.    Marland Kitchen HYDROCHLOROTHIAZIDE 25 MG PO TABS Oral Take 25 mg by mouth daily.    Marland Kitchen LANTUS 100 UNIT/ML [place] SOLN Subcutaneous Inject 10-45 Units into the skin 2 (two) times daily. Inject 45 units in AM and 10 units in PM    . LISINOPRIL 10 MG PO TABS Oral Take 1 tablet (10 mg total) by mouth daily. 30 tablet 6  . OMEPRAZOLE 20 MG PO CPDR Oral Take 20 mg by mouth daily.      Marland Kitchen PRASUGREL HCL 10  MG PO TABS Oral Take 1 tablet (10 mg total) by mouth daily. 30 tablet 0  . SIMVASTATIN 20 MG PO TABS Oral Take 20 mg by mouth every evening.      BP 160/65  Pulse 63  Temp(Src) 97.9 F (36.6 C) (Oral)  Resp 16  Ht 5\' 5"  (1.651 m)  Wt 206 lb (93.441 kg)  BMI 34.28 kg/m2  SpO2 99%  Physical Exam  Nursing note and vitals reviewed. Constitutional: She is oriented to person, place, and time. She appears well-developed and well-nourished. No distress.  HENT:  Head: Normocephalic and atraumatic.  Eyes: EOM are normal. Pupils are equal, round, and reactive to light.  Neck: Neck supple. No tracheal deviation present.  Cardiovascular: Normal rate and regular rhythm.  Exam reveals no gallop and no friction rub.   No murmur heard. Pulmonary/Chest: Effort normal. No respiratory distress. She has no wheezes. She has no rales.  Abdominal: Soft. She exhibits no  distension. There is tenderness (minimal periumbilical).  Musculoskeletal: Normal range of motion. She exhibits no edema.  Neurological: She is alert and oriented to person, place, and time. No sensory deficit.  Skin: Skin is warm and dry.  Psychiatric: She has a normal mood and affect. Her behavior is normal.    ED Course  Procedures (including critical care time)  DIAGNOSTIC STUDIES: Oxygen Saturation is 99% on room air, normal by my interpretation.    COORDINATION OF CARE: 7:24AM-Patient informed of current plan for treatment and evaluation and agrees with plan at this time.     Labs Reviewed  CBC - Abnormal; Notable for the following:    HCT 32.3 (*)    MCHC 37.2 (*)    All other components within normal limits  DIFFERENTIAL - Abnormal; Notable for the following:    Neutrophils Relative 84 (*)    Neutro Abs 8.0 (*)    Lymphocytes Relative 9 (*)    All other components within normal limits  COMPREHENSIVE METABOLIC PANEL - Abnormal; Notable for the following:    Sodium 111 (*)    Chloride 76 (*)    Glucose, Bld 228 (*)    GFR calc non Af Amer 69 (*)    GFR calc Af Amer 80 (*)    All other components within normal limits  LIPASE, BLOOD  URINALYSIS, ROUTINE W REFLEX MICROSCOPIC   No results found.   No diagnosis found.   Date: 07/28/2011  Rate: 70  Rhythm: normal sinus rhythm  QRS Axis: normal  Intervals: normal  ST/T Wave abnormalities: normal  Conduction Disutrbances:none  Narrative Interpretation:   Old EKG Reviewed: none available PAC  MDM  Sodium 111. Will admit for hyponatremia      I personally performed the services described in this documentation, which was scribed in my presence. The recorded information has been reviewed and considered.    Donnetta Hutching, MD 07/28/11 [phone]

## 2011-07-28 NOTE — ED Notes (Signed)
Patient transported to X-ray 

## 2011-07-28 NOTE — ED Notes (Signed)
CRITICAL VALUE ALERT  Critical value received:  Na+  Date of notification:  07/28/11  Time of notification:  0813  Critical value read back:yes  Nurse who received alert:  Lake Bells, RN  MD notified (1st page):  Dr Adriana Simas at [phone]

## 2011-07-28 NOTE — ED Notes (Signed)
Pt. Complains of [place] and Vomiting X2 days. States that she has been unable to hold down any fluids or food. Also notes that she had 2 cardiac stents put in on the 14th of this month. Denies abdominal pain and also denies any chest pain. States last episode of emesis was 1 hour ago. Pt is AAx4 and NAD noted.

## 2011-07-29 DIAGNOSIS — I2510 Atherosclerotic heart disease of native coronary artery without angina pectoris: Secondary | ICD-10-CM

## 2011-07-29 DIAGNOSIS — I10 Essential (primary) hypertension: Secondary | ICD-10-CM

## 2011-07-29 DIAGNOSIS — R197 Diarrhea, unspecified: Secondary | ICD-10-CM

## 2011-07-29 DIAGNOSIS — E871 Hypo-osmolality and hyponatremia: Secondary | ICD-10-CM

## 2011-07-29 LAB — BASIC METABOLIC PANEL
BUN: 17 mg/dL (ref 6–23)
CO2: 25 mEq/L (ref 19–32)
Chloride: 85 mEq/L — ABNORMAL LOW (ref 96–112)
Creatinine, Ser: 1.03 mg/dL (ref 0.50–1.10)

## 2011-07-29 LAB — GLUCOSE, CAPILLARY
Glucose-Capillary: 146 mg/dL — ABNORMAL HIGH (ref 70–99)
Glucose-Capillary: 98 mg/dL (ref 70–99)
Glucose-Capillary: 112 mg/dL — ABNORMAL HIGH (ref 70–99)

## 2011-07-29 MED ORDER — LISINOPRIL 10 MG PO TABS
20.0000 mg | ORAL_TABLET | Freq: Every day | ORAL | Status: DC
  Administered 2011-07-29 – 2011-07-30 (×2): 20 mg via ORAL
  Filled 2011-07-29 (×2): qty 2

## 2011-07-29 MED ORDER — HEPARIN SODIUM (PORCINE) 5000 UNIT/ML IJ SOLN
5000.0000 [IU] | Freq: Three times a day (TID) | INTRAMUSCULAR | Status: DC
  Administered 2011-07-29 – 2011-07-30 (×2): 5000 [IU] via SUBCUTANEOUS
  Filled 2011-07-29 (×2): qty 1

## 2011-07-29 MED ORDER — CYCLOBENZAPRINE HCL 10 MG PO TABS: 10.0000 mg | ORAL_TABLET | Freq: Three times a day (TID) | ORAL | Status: DC | PRN

## 2011-07-29 MED ORDER — PANTOPRAZOLE SODIUM 40 MG PO TBEC
40.0000 mg | DELAYED_RELEASE_TABLET | Freq: Every day | ORAL | Status: DC
  Administered 2011-07-29 – 2011-07-30 (×2): 40 mg via ORAL
  Filled 2011-07-29 (×2): qty 1

## 2011-07-29 MED ORDER — SIMVASTATIN 20 MG PO TABS
20.0000 mg | ORAL_TABLET | Freq: Every evening | ORAL | Status: DC
  Administered 2011-07-29: 20 mg via ORAL
  Filled 2011-07-29: qty 1

## 2011-07-29 MED ORDER — PANTOPRAZOLE SODIUM 40 MG IV SOLR
40.0000 mg | INTRAVENOUS | Status: DC
  Administered 2011-07-29: 40 mg via INTRAVENOUS
  Filled 2011-07-29: qty 40

## 2011-07-29 NOTE — Progress Notes (Signed)
Subjective: Vomiting,, but now developing watery diarrhea. About 4 times since this morning. No blood. No abdominal pain. No shortness of breath or chest pain.  Objective: Vital signs in last 24 hours: Filed Vitals:   07/28/11 1000 07/28/11 2207 07/29/11 0534 07/29/11 1400  BP: 149/54 161/64 132/67 139/86  Pulse: 66 88 64 57  Temp:  98.1 F (36.7 C) 98.2 F (36.8 C) 98.5 F (36.9 C)  TempSrc:  Oral Oral   Resp:  20 20 16   Height:      Weight:      SpO2: 96% 96% 98% 98%   Weight change:   Intake/Output Summary (Last 24 hours) at 07/29/11 1755 Last data filed at 07/29/11 1300  Gross per 24 hour  Intake 3029.58 ml  Output   2302 ml  Net 727.58 ml   General: More comfortable appearing HEENT moist mucous membranes Lungs clear to auscultation bilaterally without wheeze rhonchi or rales Cardiovascular regular rate rhythm without murmurs gallops rubs Abdomen obese soft nontender nondistended Extremities no clubbing cyanosis or edema  Lab Results: Basic Metabolic Panel:  Lab 07/29/11 4132 07/28/11 0739  NA 117* 111*  K 4.1 4.0  CL 85* 76*  CO2 25 24  GLUCOSE 108* 228*  BUN 17 18  CREATININE 1.03 0.80  CALCIUM 8.9 9.6  MG -- --  PHOS -- --   Liver Function Tests:  Lab 07/28/11 0739  AST 24  ALT 15  ALKPHOS 112  BILITOT 1.0  PROT 7.6  ALBUMIN 3.7    Lab 07/28/11 0739  LIPASE 22  AMYLASE --   No results found for this basename: AMMONIA:2 in the last 168 hours CBC:  Lab 07/28/11 0739  WBC 9.5  NEUTROABS 8.0*  HGB 12.0  HCT 32.3*  MCV 82.2  PLT 259   Cardiac Enzymes:  Lab 07/28/11 0739  CKTOTAL 246*  CKMB 7.3*  CKMBINDEX --  TROPONINI <0.30   BNP: No results found for this basename: PROBNP:3 in the last 168 hours D-Dimer: No results found for this basename: DDIMER:2 in the last 168 hours CBG:  Lab 07/29/11 1650 07/29/11 1114 07/29/11 0730 07/29/11 0115 07/28/11 2008 07/28/11 1645  GLUCAP 161* 103* 98 144* 146* 173*   Hemoglobin  A1C:  Lab 07/28/11 0739  HGBA1C 7.4*   Fasting Lipid Panel: No results found for this basename: CHOL,HDL,LDLCALC,TRIG,CHOLHDL,LDLDIRECT in the last 440 hours Thyroid Function Tests:  Lab 07/28/11 0739  TSH 2.034  T4TOTAL --  [name]4 --  T3FREE --  THYROIDAB --   Coagulation: No results found for this basename: LABPROT:4,INR:4 in the last 168 hours Anemia Panel: No results found for this basename: VITAMINB12,FOLATE,FERRITIN,TIBC,IRON,RETICCTPCT in the last 168 hours Urine Drug Screen: Drugs of Abuse  No results found for this basename: labopia, cocainscrnur, labbenz, amphetmu, thcu, labbarb    Alcohol Level: No results found for this basename: ETH:2 in the last 168 hours Urinalysis:  Lab 07/28/11 0827  COLORURINE YELLOW  LABSPEC 1.015  PHURINE 7.0  GLUCOSEU 500*  HGBUR SMALL*  BILIRUBINUR NEGATIVE  KETONESUR NEGATIVE  PROTEINUR 100*  UROBILINOGEN 0.2  NITRITE NEGATIVE  LEUKOCYTESUR NEGATIVE   Micro Results: No results found for this or any previous visit (from the past 240 hour(s)). Studies/Results: Dg Abd Acute W/chest  07/28/2011  *RADIOLOGY REPORT*  Clinical Data: Nausea and vomiting  ACUTE ABDOMEN SERIES (ABDOMEN 2 VIEW & CHEST 1 VIEW)  Comparison: None.  Findings: Normal mediastinum and cardiac silhouette.  Costophrenic angles are clear.  No effusion, infiltrate, pneumothorax.  No  dilated large or small bowel.  There is gas in the rectum. Cholecystectomy clips noted.  No intraperitoneal free air.  IMPRESSION:  1.  No acute cardiopulmonary process. 2.  No evidence of bowel obstruction or intraperitoneal free air.  Original Report Authenticated By: Genevive Bi, M.D.   Scheduled Meds:   . aspirin  81 mg Oral Daily  . carvedilol  6.25 mg Oral BID  . heparin subcutaneous  5,000 Units Subcutaneous Q8H  . insulin aspart  0-5 Units Subcutaneous QHS  . insulin aspart  0-9 Units Subcutaneous TID WC  . insulin glargine  10 Units Subcutaneous Daily  . lisinopril  20  mg Oral Daily  . prasugrel  10 mg Oral Daily  . simvastatin  20 mg Oral QPM  . DISCONTD: pantoprazole (PROTONIX) IV  40 mg Intravenous Q24H   Continuous Infusions:   . sodium chloride 50 mL/hr at 07/29/11 1500   PRN Meds:.acetaminophen, acetaminophen, alum & mag hydroxide-simeth, cyclobenzaprine, morphine injection, ondansetron (ZOFRAN) IV, ondansetron, promethazine Assessment/Plan: Principal Problem:  *Hyponatremia Active Problems:  Nausea and vomiting  Benign hypertension  DM (diabetes mellitus), type 2  Diarrhea  GERD  CAD (coronary artery disease), native coronary artery  Sodium improved. TSH normal. Decrease saline to 50 cc an hour. Advance diet. Check stool for C. difficile, fecal lactoferrin, culture, ova and parasite. Continue current Lantus dosing. Resume lisinopril. Change PPI to by mouth.   LOS: 1 day   Elexius Minar L 07/29/2011, 5:55 PM

## 2011-07-29 NOTE — Progress Notes (Signed)
UR Chart Review Completed  

## 2011-07-29 NOTE — Care Management Note (Unsigned)
    Page 1 of 1   07/29/2011     12:01:38 PM   CARE MANAGEMENT NOTE 07/29/2011  Patient:  Claudia [name], Claudia [name]   Account Number:  [id number]  Date Initiated:  07/29/2011  Documentation initiated by:  Rosemary Holms  Subjective/Objective Assessment:   Pt admitted from home where she lives with her son. Admitted with N/V     Action/Plan:   Spoke w/ pt at [facility]. Plans to DC home and does not anticipate any HH needs   Anticipated DC Date:  07/31/2011   Anticipated DC Plan:  HOME/SELF CARE      DC Planning Services  CM consult      Choice offered to / List presented to:             Status of service:  In process, will continue to follow Medicare Important Message given?   (If response is "NO", the following Medicare IM given date fields will be blank) Date Medicare IM given:   Date Additional Medicare IM given:    Discharge Disposition:    Per UR Regulation:    If discussed at Long Length of Stay Meetings, dates discussed:    Comments:  07/29/11 1000 Corinda Ammon Leanord Hawking RN BSN CM

## 2011-07-29 NOTE — Progress Notes (Signed)
CRITICAL VALUE ALERT  Critical value received:  Sodium 117   Date of notification:  07/29/2011  Time of notification:  0801  Critical value read back:yes  Nurse who received alert:  Fara Chute, RN   MD notified (1st page):  Dr. Lendell Caprice   Time of first page:  0800  MD notified (2nd page):  Dr. York Pellant   Time of second page:  1045  Responding MD:    Time MD responded:

## 2011-07-29 NOTE — Progress Notes (Signed)
Notifed Dr. Lendell Caprice that Claudia [name] has a critical low sodium of 117 this morning.

## 2011-07-30 DIAGNOSIS — R197 Diarrhea, unspecified: Secondary | ICD-10-CM

## 2011-07-30 DIAGNOSIS — I2510 Atherosclerotic heart disease of native coronary artery without angina pectoris: Secondary | ICD-10-CM

## 2011-07-30 DIAGNOSIS — I10 Essential (primary) hypertension: Secondary | ICD-10-CM

## 2011-07-30 DIAGNOSIS — E871 Hypo-osmolality and hyponatremia: Secondary | ICD-10-CM

## 2011-07-30 LAB — BASIC METABOLIC PANEL
BUN: 18 mg/dL (ref 6–23)
CO2: 24 mEq/L (ref 19–32)
Calcium: 9 mg/dL (ref 8.4–10.5)
Chloride: 94 mEq/L — ABNORMAL LOW (ref 96–112)
Creatinine, Ser: 1.13 mg/dL — ABNORMAL HIGH (ref 0.50–1.10)

## 2011-07-30 LAB — GLUCOSE, CAPILLARY: Glucose-Capillary: 231 mg/dL — ABNORMAL HIGH (ref 70–99)

## 2011-07-30 LAB — CORTISOL-AM, BLOOD: Cortisol - AM: 8.4 ug/dL (ref 4.3–22.4)

## 2011-07-30 MED ORDER — LISINOPRIL 20 MG PO TABS: 20.0000 mg | ORAL_TABLET | Freq: Every day | ORAL | Status: DC

## 2011-07-30 NOTE — Discharge Summary (Signed)
Physician Discharge Summary  Patient ID: Claudia [name] MRN: [id number] DOB/AGE: [birth year] 76 y.o.  Admit date: 07/28/2011 Discharge date: 07/30/2011  Discharge Diagnoses:  Principal Problem:  *Hyponatremia Active Problems:  Nausea and vomiting  Benign hypertension  DM (diabetes mellitus), type 2  Diarrhea  GERD  CAD (coronary artery disease), native coronary artery  Medication List  As of 07/30/2011  5:01 PM   STOP taking these medications         lisinopril-hydrochlorothiazide 20-25 MG per tablet         TAKE these medications         acetaminophen 500 MG tablet   Commonly known as: TYLENOL   Take 1,000 mg by mouth 2 (two) times daily. For pain      aspirin 81 MG chewable tablet   Chew 1 tablet (81 mg total) by mouth daily.      carvedilol 6.25 MG tablet   Commonly known as: COREG   Take 6.25 mg by mouth 2 (two) times daily.      cyclobenzaprine 10 MG tablet   Commonly known as: FLEXERIL   Take 10 mg by mouth every 8 (eight) hours as needed. For muscle spasms      Fish Oil 1200 MG Caps   Take 1 capsule by mouth 2 (two) times daily.      LANTUS 100 UNIT/ML injection   Generic drug: insulin glargine   Inject 10-45 Units into the skin 2 (two) times daily. Inject 45 units in AM and 10 units in PM      lisinopril 20 MG tablet   Commonly known as: PRINIVIL,ZESTRIL   Take 1 tablet (20 mg total) by mouth daily.      omeprazole 20 MG capsule   Commonly known as: PRILOSEC   Take 20 mg by mouth daily.      prasugrel 10 MG Tabs   Commonly known as: EFFIENT   Take 1 tablet (10 mg total) by mouth daily.      simvastatin 20 MG tablet   Commonly known as: ZOCOR   Take 20 mg by mouth every evening.           Discharge Orders    Future Orders Please Complete By Expires   Diet - low sodium heart healthy      Activity as tolerated - No restrictions        Follow-up Information    Follow up with [facility] TOM, MD in 1 week. (to check sodium level and blood  pressure)    Contact information:   [address] [facility] [place] [address] [phone]          Disposition: 01-Home or Self Care  Discharged Condition: stable  Consults:  none  Labs:    Sodium      117  126     Potassium      4.1 4.1    Chloride      85  94     CO2      25 24    Mean Plasma Glucose      166     BUN      17 18    Creatinine, Ser      1.03 1.13    Calcium      8.9 9.0    GFR calc non Af Amer      51 46    GFR calc Af Amer      59  53  Glucose, Bld      108 121    Alkaline Phosphatase      112     Albumin      3.7     Lipase      22     AST      24     ALT      15     Total Protein      7.6     Total Bilirubin      1.0      CARDIAC PROFILE    CK, MB      7.3      Total CK      246     Troponin I      <0.30       CBC    WBC      9.5     RBC      3.93     Hemoglobin      12.0     HCT      32.3     MCV      82.2     MCH      30.5     MCHC      37.2     RDW      11.7     Platelets      259      DIFFERENTIAL    Neutrophils Relative      84     Lymphocytes Relative      9     Monocytes Relative      6     Eosinophils Relative      0     Basophils Relative      0     Neutro Abs      8.0     Lymphs Abs      0.9     Monocytes Absolute      0.6     Eosinophils Absolute      0.0     Basophils Absolute      0.0      ADRENAL, OTHER    Cortisol - AM      8.4      DIABETES    Hemoglobin A1C      =6.5% Diagnostic of Diabetes Mellitus (if abnormal result is confirmed) 5.7-6.4% Increased risk of developing Diabetes Mellitus References:Diagnosis and Classification of Diabetes Mellitus,Diabetes Care,2011,34(Suppl 1):S62-S69 and Standards of Medical Care in ..."7.4 =6.5% Diagnostic of Diabetes Mellitus (if abnormal result is confirmed) 5.7-6.4% Increased risk of developing Diabetes Mellitus References:Diagnosis and Classification of Diabetes Mellitus,Diabetes Care,2011,34(Suppl 1):S62-S69 and Standards of Medical Care in  ..." border=0 src="[email or web address]%[email or web address]" width=5 height=10     Glucose, Bld      108 121     THYROID    TSH      2.034      URINALYSIS    Color, Urine      YELLOW     APPearance      CLEAR     Specific Gravity, Urine      1.015     pH      7.0     Glucose, UA      500     Bilirubin Urine      NEGATIVE     Ketones, ur      NEGATIVE     Protein, ur      100     Urobilinogen, UA  0.2     Nitrite      NEGATIVE     Leukocytes, UA      NEGATIVE     Hgb urine dipstick      SMALL     RBC / HPF      3-6     Squamous Epithelial / LPF      FEW     Diagnostics:  Dg Abd Acute W/chest  07/28/2011  *RADIOLOGY REPORT*  Clinical Data: Nausea and vomiting  ACUTE ABDOMEN SERIES (ABDOMEN 2 VIEW & CHEST 1 VIEW)  Comparison: None.  Findings: Normal mediastinum and cardiac silhouette.  Costophrenic angles are clear.  No effusion, infiltrate, pneumothorax.  No dilated large or small bowel.  There is gas in the rectum. Cholecystectomy clips noted.  No intraperitoneal free air.  IMPRESSION:  1.  No acute cardiopulmonary process. 2.  No evidence of bowel obstruction or intraperitoneal free air.  Original Report Authenticated By: Genevive Bi, M.D.   EKG: Normal sinus rhythm with PACs and left atrial enlargement  Full Code   Hospital Course:   See H&P for complete admission details.  [name] is a 78 y.o. Female who presented with weakness, nausea, vomiting. Her workup was significant for a sodium of 111.  Her vital signs, LFTs, lipase were unremarkable. Her abdomen was soft and nontender. She had been on hydrochlorothiazide as an outpatient. This was stopped, she was started on saline and antibiotics. She had a few episodes of diarrhea. Her sodium improved, her nausea vomiting resolved, and by the time of discharge her diarrhea had resolved as well. She was able to ambulate and is tolerating a regular diet. I have recommended she stop the  hydrochlorothiazide indefinitely, as she has had recurrent admissions for hyponatremia. I've given her a prescription for lisinopril. Previously she was on a combination tablet. Total time on the day of discharge greater than 30 minutes.  Discharge Exam:  Blood pressure 129/68, pulse 54, temperature 98.2 F (36.8 C), temperature source Oral, resp. rate 20, height 5\' 5"  (1.651 m), weight 93.441 kg (206 lb), SpO2 98.00%.  Exam unchanged from 07/29/11  Signed: Crista Curb L 07/30/2011, 5:01 PM

## 2011-07-30 NOTE — Progress Notes (Signed)
Pt and son provided discharge instructions.  Verbalized understanding.  Provided prescription from Dr. Lendell Caprice for Prinivil.  Removed IV.  Pt tolerated well.

## 2011-08-22 ENCOUNTER — Other Ambulatory Visit: Payer: Self-pay | Admitting: Internal Medicine

## 2011-08-25 ENCOUNTER — Encounter (HOSPITAL_COMMUNITY)
Admission: RE | Admit: 2011-08-25 | Discharge: 2011-08-25 | Disposition: A | Discharge status: Home / Self Care | Payer: Medicare HMO | Source: Ambulatory Visit | Attending: Cardiology | Admitting: Cardiology

## 2011-08-25 ENCOUNTER — Encounter (HOSPITAL_COMMUNITY): Payer: Self-pay

## 2011-08-25 VITALS — BP 160/60 | HR 72 | Ht 67.0 in | Wt 205.2 lb

## 2011-08-25 DIAGNOSIS — E119 Type 2 diabetes mellitus without complications: Secondary | ICD-10-CM | POA: Insufficient documentation

## 2011-08-25 DIAGNOSIS — I10 Essential (primary) hypertension: Secondary | ICD-10-CM | POA: Insufficient documentation

## 2011-08-25 DIAGNOSIS — Z5189 Encounter for other specified aftercare: Secondary | ICD-10-CM | POA: Insufficient documentation

## 2011-08-25 DIAGNOSIS — I2510 Atherosclerotic heart disease of native coronary artery without angina pectoris: Secondary | ICD-10-CM | POA: Insufficient documentation

## 2011-08-25 DIAGNOSIS — K219 Gastro-esophageal reflux disease without esophagitis: Secondary | ICD-10-CM | POA: Insufficient documentation

## 2011-08-25 DIAGNOSIS — Z9861 Coronary angioplasty status: Secondary | ICD-10-CM | POA: Insufficient documentation

## 2011-08-25 NOTE — Patient Instructions (Signed)
Pt has finished orientation and is scheduled to start CR on 08/29/11 at 6:45 am. Pt has been instructed to arrive to class 15 minutes early for scheduled class. Pt has been instructed to wear comfortable clothing and shoes with rubber soles. Pt has been told to take their medications 1 hour prior to coming to class.  If the patient is not going to attend class, he/she has been instructed to call.

## 2011-08-25 NOTE — Progress Notes (Signed)
Patient has been referred to Cardiac Rehab by Dr. Jacinto Halim due to Stentx3 placement. During orientation advised patient on arrival and appointment times what to wear, what to do before, during and after exercise. Reviewed attendance and class policy. Talked about inclement weather and class consultation policy. Pt is scheduled to start Cardiac Rehab on 08/29/11 at 6:45 am. Pt was advised to come to class 5 minutes before class starts. He was also given instructions on meeting with the dietician and attending the Family Structure classes. Pt is eager to get started.

## 2011-08-29 ENCOUNTER — Encounter (HOSPITAL_COMMUNITY)
Admission: RE | Admit: 2011-08-29 | Discharge: 2011-08-29 | Disposition: A | Discharge status: Home / Self Care | Payer: Medicare HMO | Source: Ambulatory Visit | Attending: Cardiology | Admitting: Cardiology

## 2011-08-31 ENCOUNTER — Encounter (HOSPITAL_COMMUNITY)
Admission: RE | Admit: 2011-08-31 | Discharge: 2011-08-31 | Disposition: A | Discharge status: Home / Self Care | Payer: Medicare HMO | Source: Ambulatory Visit | Attending: Cardiology | Admitting: Cardiology

## 2011-09-02 ENCOUNTER — Encounter (HOSPITAL_COMMUNITY)
Admission: RE | Admit: 2011-09-02 | Discharge: 2011-09-02 | Disposition: A | Discharge status: Home / Self Care | Payer: Medicare HMO | Source: Ambulatory Visit | Attending: Cardiology | Admitting: Cardiology

## 2011-09-05 ENCOUNTER — Encounter (HOSPITAL_COMMUNITY)
Admission: RE | Admit: 2011-09-05 | Discharge: 2011-09-05 | Disposition: A | Discharge status: Home / Self Care | Payer: Medicare HMO | Source: Ambulatory Visit | Attending: Cardiology | Admitting: Cardiology

## 2011-09-05 DIAGNOSIS — I10 Essential (primary) hypertension: Secondary | ICD-10-CM | POA: Insufficient documentation

## 2011-09-05 DIAGNOSIS — Z5189 Encounter for other specified aftercare: Secondary | ICD-10-CM | POA: Insufficient documentation

## 2011-09-05 DIAGNOSIS — Z9861 Coronary angioplasty status: Secondary | ICD-10-CM | POA: Insufficient documentation

## 2011-09-05 DIAGNOSIS — I2510 Atherosclerotic heart disease of native coronary artery without angina pectoris: Secondary | ICD-10-CM | POA: Insufficient documentation

## 2011-09-05 DIAGNOSIS — K219 Gastro-esophageal reflux disease without esophagitis: Secondary | ICD-10-CM | POA: Insufficient documentation

## 2011-09-05 DIAGNOSIS — E119 Type 2 diabetes mellitus without complications: Secondary | ICD-10-CM | POA: Insufficient documentation

## 2011-09-07 ENCOUNTER — Encounter (HOSPITAL_COMMUNITY)
Admission: RE | Admit: 2011-09-07 | Discharge: 2011-09-07 | Disposition: A | Discharge status: Home / Self Care | Payer: Medicare HMO | Source: Ambulatory Visit | Attending: Cardiology | Admitting: Cardiology

## 2011-09-09 ENCOUNTER — Encounter (HOSPITAL_COMMUNITY)
Admission: RE | Admit: 2011-09-09 | Discharge: 2011-09-09 | Disposition: A | Discharge status: Home / Self Care | Payer: Medicare HMO | Source: Ambulatory Visit | Attending: Cardiology | Admitting: Cardiology

## 2011-09-12 ENCOUNTER — Encounter (HOSPITAL_COMMUNITY)
Admission: RE | Admit: 2011-09-12 | Discharge: 2011-09-12 | Disposition: A | Discharge status: Home / Self Care | Payer: Medicare HMO | Source: Ambulatory Visit | Attending: Cardiology | Admitting: Cardiology

## 2011-09-14 ENCOUNTER — Encounter (HOSPITAL_COMMUNITY)
Admission: RE | Admit: 2011-09-14 | Discharge: 2011-09-14 | Disposition: A | Discharge status: Home / Self Care | Payer: Medicare HMO | Source: Ambulatory Visit | Attending: Cardiology | Admitting: Cardiology

## 2011-09-16 ENCOUNTER — Encounter (HOSPITAL_COMMUNITY)
Admission: RE | Admit: 2011-09-16 | Discharge: 2011-09-16 | Disposition: A | Discharge status: Home / Self Care | Payer: Medicare HMO | Source: Ambulatory Visit | Attending: Cardiology | Admitting: Cardiology

## 2011-09-19 ENCOUNTER — Encounter (HOSPITAL_COMMUNITY)
Admission: RE | Admit: 2011-09-19 | Discharge: 2011-09-19 | Disposition: A | Discharge status: Home / Self Care | Payer: Medicare HMO | Source: Ambulatory Visit | Attending: Cardiovascular Disease | Admitting: Cardiovascular Disease

## 2011-09-21 ENCOUNTER — Encounter (HOSPITAL_COMMUNITY)
Admission: RE | Admit: 2011-09-21 | Discharge: 2011-09-21 | Disposition: A | Discharge status: Home / Self Care | Payer: Medicare HMO | Source: Ambulatory Visit | Attending: Cardiology | Admitting: Cardiology

## 2011-09-23 ENCOUNTER — Encounter (HOSPITAL_COMMUNITY)
Admission: RE | Admit: 2011-09-23 | Discharge: 2011-09-23 | Disposition: A | Discharge status: Home / Self Care | Payer: Medicare HMO | Source: Ambulatory Visit | Attending: Cardiology | Admitting: Cardiology

## 2011-09-26 ENCOUNTER — Encounter (HOSPITAL_COMMUNITY)
Admission: RE | Admit: 2011-09-26 | Discharge: 2011-09-26 | Disposition: A | Discharge status: Home / Self Care | Payer: Medicare HMO | Source: Ambulatory Visit | Attending: Cardiology | Admitting: Cardiology

## 2011-09-28 ENCOUNTER — Encounter (HOSPITAL_COMMUNITY)
Admission: RE | Admit: 2011-09-28 | Discharge: 2011-09-28 | Disposition: A | Discharge status: Home / Self Care | Payer: Medicare HMO | Source: Ambulatory Visit | Attending: Cardiology | Admitting: Cardiology

## 2011-09-30 ENCOUNTER — Encounter (HOSPITAL_COMMUNITY)
Admission: RE | Admit: 2011-09-30 | Discharge: 2011-09-30 | Disposition: A | Discharge status: Home / Self Care | Payer: Medicare HMO | Source: Ambulatory Visit | Attending: Cardiology | Admitting: Cardiology

## 2011-10-03 ENCOUNTER — Encounter (HOSPITAL_COMMUNITY)
Admission: RE | Admit: 2011-10-03 | Discharge: 2011-10-03 | Disposition: A | Discharge status: Home / Self Care | Payer: Medicare HMO | Source: Ambulatory Visit | Attending: Cardiology | Admitting: Cardiology

## 2011-10-05 ENCOUNTER — Encounter (HOSPITAL_COMMUNITY)
Admission: RE | Admit: 2011-10-05 | Discharge: 2011-10-05 | Disposition: A | Discharge status: Home / Self Care | Payer: Medicare HMO | Source: Ambulatory Visit | Attending: Cardiology | Admitting: Cardiology

## 2011-10-06 NOTE — Progress Notes (Signed)
Cardiac Rehabilitation Program Outcomes Report   Orientation:  08/25/2011 Graduate Date:  tbd Discharge Date:  tbd # of sessions completed: 3 DX: Stent X 2: Coronary Artery Disease  Cardiologist: Delrae Rend Family MD:  Dr. Larey Brick Time:  06:45  A.  Exercise Program:  Tolerates exercise @ 3.6 METS for 15 minutes  B.  Mental Health:  Good mental attitude  C.  Education/Instruction/Skills  Knows THR for exercise and Uses Perceived Exertion Scale and/or Dyspnea Scale  Uses Perceived Exertion Scale and/or Dyspnea Scale  D.  Nutrition/Weight Control/Body Composition:  Adherence to prescribed nutrition program: good    E.  Blood Lipids    No results found for this basename: CHOL, HDL, LDLCALC, LDLDIRECT, TRIG, CHOLHDL    F.  Lifestyle Changes:  Making positive lifestyle changes  G.  Symptoms noted with exercise:  Asymptomatic  Report Completed By:  Angelica Pou RN   Comments:  This is patients 1st week report She has done well. Achieved a 3.6 METS. Her resting Hr was 66 and her resting  BP is 130/72. Her peak HR is 90 and her peak BP is 138/70 . A report will follow upon halfway session.

## 2011-10-07 ENCOUNTER — Encounter (HOSPITAL_COMMUNITY)
Admission: RE | Admit: 2011-10-07 | Discharge: 2011-10-07 | Disposition: A | Discharge status: Home / Self Care | Payer: Medicare HMO | Source: Ambulatory Visit | Attending: Cardiology | Admitting: Cardiology

## 2011-10-07 DIAGNOSIS — Z5189 Encounter for other specified aftercare: Secondary | ICD-10-CM | POA: Insufficient documentation

## 2011-10-07 DIAGNOSIS — E119 Type 2 diabetes mellitus without complications: Secondary | ICD-10-CM | POA: Insufficient documentation

## 2011-10-07 DIAGNOSIS — I2510 Atherosclerotic heart disease of native coronary artery without angina pectoris: Secondary | ICD-10-CM | POA: Insufficient documentation

## 2011-10-07 DIAGNOSIS — I10 Essential (primary) hypertension: Secondary | ICD-10-CM | POA: Insufficient documentation

## 2011-10-07 DIAGNOSIS — K219 Gastro-esophageal reflux disease without esophagitis: Secondary | ICD-10-CM | POA: Insufficient documentation

## 2011-10-07 DIAGNOSIS — Z9861 Coronary angioplasty status: Secondary | ICD-10-CM | POA: Insufficient documentation

## 2011-10-10 ENCOUNTER — Encounter (HOSPITAL_COMMUNITY)
Admission: RE | Admit: 2011-10-10 | Discharge: 2011-10-10 | Disposition: A | Discharge status: Home / Self Care | Payer: Medicare HMO | Source: Ambulatory Visit | Attending: Cardiology | Admitting: Cardiology

## 2011-10-12 ENCOUNTER — Encounter (HOSPITAL_COMMUNITY): Payer: Medicare HMO

## 2011-10-14 ENCOUNTER — Encounter (HOSPITAL_COMMUNITY): Payer: Medicare HMO

## 2011-10-17 ENCOUNTER — Encounter (HOSPITAL_COMMUNITY): Payer: Medicare HMO

## 2011-10-19 ENCOUNTER — Encounter (HOSPITAL_COMMUNITY): Payer: Medicare HMO

## 2011-10-21 ENCOUNTER — Encounter (HOSPITAL_COMMUNITY)
Admission: RE | Admit: 2011-10-21 | Discharge: 2011-10-21 | Disposition: A | Discharge status: Home / Self Care | Payer: Medicare HMO | Source: Ambulatory Visit | Attending: Cardiology | Admitting: Cardiology

## 2011-10-24 ENCOUNTER — Encounter (HOSPITAL_COMMUNITY)
Admission: RE | Admit: 2011-10-24 | Discharge: 2011-10-24 | Disposition: A | Discharge status: Home / Self Care | Payer: Medicare HMO | Source: Ambulatory Visit | Attending: Cardiology | Admitting: Cardiology

## 2011-10-25 ENCOUNTER — Other Ambulatory Visit: Payer: Self-pay | Admitting: Family Medicine

## 2011-10-25 DIAGNOSIS — IMO0002 Reserved for concepts with insufficient information to code with codable children: Secondary | ICD-10-CM

## 2011-10-26 ENCOUNTER — Ambulatory Visit (HOSPITAL_COMMUNITY)
Admission: RE | Admit: 2011-10-26 | Discharge: 2011-10-26 | Disposition: A | Discharge status: Home / Self Care | Payer: Medicare HMO | Source: Ambulatory Visit | Attending: Family Medicine | Admitting: Family Medicine

## 2011-10-26 ENCOUNTER — Encounter (HOSPITAL_COMMUNITY): Payer: Medicare HMO

## 2011-10-26 ENCOUNTER — Encounter (HOSPITAL_COMMUNITY): Payer: Self-pay | Admitting: *Deleted

## 2011-10-26 ENCOUNTER — Inpatient Hospital Stay (HOSPITAL_COMMUNITY)
Admission: EM | Admit: 2011-10-26 | Discharge: 2011-10-28 | DRG: 638 | Disposition: A | Discharge status: Home / Self Care | Payer: Medicare HMO | Attending: Internal Medicine | Admitting: Internal Medicine

## 2011-10-26 DIAGNOSIS — E1165 Type 2 diabetes mellitus with hyperglycemia: Principal | ICD-10-CM | POA: Diagnosis present

## 2011-10-26 DIAGNOSIS — I2510 Atherosclerotic heart disease of native coronary artery without angina pectoris: Secondary | ICD-10-CM | POA: Diagnosis present

## 2011-10-26 DIAGNOSIS — IMO0002 Reserved for concepts with insufficient information to code with codable children: Secondary | ICD-10-CM

## 2011-10-26 DIAGNOSIS — R197 Diarrhea, unspecified: Secondary | ICD-10-CM | POA: Diagnosis not present

## 2011-10-26 DIAGNOSIS — K219 Gastro-esophageal reflux disease without esophagitis: Secondary | ICD-10-CM | POA: Diagnosis present

## 2011-10-26 DIAGNOSIS — Z9861 Coronary angioplasty status: Secondary | ICD-10-CM

## 2011-10-26 DIAGNOSIS — D649 Anemia, unspecified: Secondary | ICD-10-CM | POA: Diagnosis present

## 2011-10-26 DIAGNOSIS — N179 Acute kidney failure, unspecified: Secondary | ICD-10-CM | POA: Diagnosis present

## 2011-10-26 DIAGNOSIS — Z860100 Personal history of colon polyps, unspecified: Secondary | ICD-10-CM

## 2011-10-26 DIAGNOSIS — Z8711 Personal history of peptic ulcer disease: Secondary | ICD-10-CM

## 2011-10-26 DIAGNOSIS — Z79899 Other long term (current) drug therapy: Secondary | ICD-10-CM

## 2011-10-26 DIAGNOSIS — M129 Arthropathy, unspecified: Secondary | ICD-10-CM | POA: Diagnosis present

## 2011-10-26 DIAGNOSIS — L97509 Non-pressure chronic ulcer of other part of unspecified foot with unspecified severity: Secondary | ICD-10-CM | POA: Insufficient documentation

## 2011-10-26 DIAGNOSIS — Z794 Long term (current) use of insulin: Secondary | ICD-10-CM

## 2011-10-26 DIAGNOSIS — E860 Dehydration: Secondary | ICD-10-CM

## 2011-10-26 DIAGNOSIS — I10 Essential (primary) hypertension: Secondary | ICD-10-CM | POA: Diagnosis present

## 2011-10-26 DIAGNOSIS — N289 Disorder of kidney and ureter, unspecified: Secondary | ICD-10-CM

## 2011-10-26 DIAGNOSIS — M869 Osteomyelitis, unspecified: Secondary | ICD-10-CM | POA: Diagnosis present

## 2011-10-26 DIAGNOSIS — Z886 Allergy status to analgesic agent status: Secondary | ICD-10-CM

## 2011-10-26 DIAGNOSIS — E7800 Pure hypercholesterolemia, unspecified: Secondary | ICD-10-CM | POA: Diagnosis present

## 2011-10-26 DIAGNOSIS — Z85828 Personal history of other malignant neoplasm of skin: Secondary | ICD-10-CM

## 2011-10-26 DIAGNOSIS — E871 Hypo-osmolality and hyponatremia: Secondary | ICD-10-CM | POA: Diagnosis present

## 2011-10-26 DIAGNOSIS — M9080 Osteopathy in diseases classified elsewhere, unspecified site: Secondary | ICD-10-CM | POA: Diagnosis present

## 2011-10-26 DIAGNOSIS — E872 Acidosis, unspecified: Secondary | ICD-10-CM | POA: Diagnosis present

## 2011-10-26 DIAGNOSIS — E119 Type 2 diabetes mellitus without complications: Secondary | ICD-10-CM | POA: Diagnosis present

## 2011-10-26 DIAGNOSIS — E875 Hyperkalemia: Secondary | ICD-10-CM | POA: Diagnosis present

## 2011-10-26 LAB — CBC WITH DIFFERENTIAL/PLATELET
Basophils Absolute: 0 10*3/uL (ref 0.0–0.1)
Eosinophils Relative: 4 % (ref 0–5)
Lymphocytes Relative: 44 % (ref 12–46)
Lymphs Abs: 1.8 10*3/uL (ref 0.7–4.0)
MCV: 90.7 fL (ref 78.0–100.0)
Neutro Abs: 1.7 10*3/uL (ref 1.7–7.7)
Neutrophils Relative %: 43 % (ref 43–77)
Platelets: 214 10*3/uL (ref 150–400)
RBC: 3.77 MIL/uL — ABNORMAL LOW (ref 3.87–5.11)
RDW: 12.7 % (ref 11.5–15.5)
WBC: 4 10*3/uL (ref 4.0–10.5)

## 2011-10-26 LAB — BASIC METABOLIC PANEL
Calcium: 9.8 mg/dL (ref 8.4–10.5)
Creatinine, Ser: 1.97 mg/dL — ABNORMAL HIGH (ref 0.50–1.10)
GFR calc Af Amer: 27 mL/min — ABNORMAL LOW (ref >90)
GFR calc non Af Amer: 23 mL/min — ABNORMAL LOW (ref >90)
Sodium: 131 mEq/L — ABNORMAL LOW (ref 135–145)

## 2011-10-26 MED ORDER — VANCOMYCIN HCL 1000 MG IV SOLR: 15.0000 mg/kg | Freq: Once | INTRAVENOUS | Status: DC

## 2011-10-26 MED ORDER — INSULIN ASPART 100 UNIT/ML ~~LOC~~ SOLN
8.0000 [IU] | Freq: Once | SUBCUTANEOUS | Status: AC
  Administered 2011-10-26: 8 [IU] via INTRAVENOUS
  Filled 2011-10-26: qty 1

## 2011-10-26 MED ORDER — VANCOMYCIN HCL IN DEXTROSE 1-5 GM/200ML-% IV SOLN
1000.0000 mg | Freq: Once | INTRAVENOUS | Status: AC
  Administered 2011-10-26: 1000 mg via INTRAVENOUS
  Filled 2011-10-26: qty 200

## 2011-10-26 MED ORDER — INSULIN ASPART 100 UNIT/ML IV SOLN
8.0000 [IU] | Freq: Once | INTRAVENOUS | Status: DC
  Filled 2011-10-26: qty 0.08

## 2011-10-26 MED ORDER — DEXTROSE 50 % IV SOLN
25.0000 g | Freq: Once | INTRAVENOUS | Status: AC
  Administered 2011-10-26: 25 g via INTRAVENOUS
  Filled 2011-10-26: qty 50

## 2011-10-26 MED ORDER — SODIUM CHLORIDE 0.9 % IV SOLN
1.0000 g | Freq: Once | INTRAVENOUS | Status: AC
  Administered 2011-10-26: 1 g via INTRAVENOUS
  Filled 2011-10-26: qty 10

## 2011-10-26 MED ORDER — PIPERACILLIN-TAZOBACTAM 3.375 G IVPB
3.3750 g | Freq: Once | INTRAVENOUS | Status: AC
  Administered 2011-10-26: 3.375 g via INTRAVENOUS
  Filled 2011-10-26: qty 50

## 2011-10-26 MED ORDER — SODIUM BICARBONATE 8.4 % IV SOLN
50.0000 meq | Freq: Once | INTRAVENOUS | Status: AC
  Administered 2011-10-26: 50 meq via INTRAVENOUS
  Filled 2011-10-26: qty 50

## 2011-10-26 MED ORDER — SODIUM CHLORIDE 0.9 % IV BOLUS (SEPSIS)
500.0000 mL | Freq: Once | INTRAVENOUS | Status: AC
  Administered 2011-10-26: 500 mL via INTRAVENOUS

## 2011-10-26 MED ORDER — SODIUM CHLORIDE 0.9 % IV SOLN
INTRAVENOUS | Status: AC
  Administered 2011-10-26: 100 mL/h via INTRAVENOUS

## 2011-10-26 MED ORDER — SODIUM CHLORIDE 0.9 % IV BOLUS (SEPSIS)
1000.0000 mL | Freq: Once | INTRAVENOUS | Status: AC
  Administered 2011-10-26: 1000 mL via INTRAVENOUS

## 2011-10-26 MED ORDER — SODIUM POLYSTYRENE SULFONATE 15 GM/60ML PO SUSP
15.0000 g | Freq: Once | ORAL | Status: AC
  Administered 2011-10-27: 15 g via ORAL
  Filled 2011-10-26: qty 60

## 2011-10-26 NOTE — ED Notes (Signed)
Critical labs reported.

## 2011-10-26 NOTE — ED Provider Notes (Signed)
History     CSN: [id number]  Arrival date & time 10/26/11  1732   First MD Initiated Contact with Patient 10/26/11 2013      Chief Complaint  Patient presents with  . osteomyelitis in left second toe     (Consider location/radiation/quality/duration/timing/severity/associated sxs/prior treatment) HPI Comments: Claudia [name] 78 y.o. female   The chief complaint is: Patient presents with:   osteomyelitis in left second toe     Patient with history of diabetes presents today with osteomyelitis of the left second toe confirmed on MR today. She was sent over by the ordering physician. She states she began to have pain about a week ago in the left second toe at work while she was walking around. She did not notice any ulcerations at that time. She had her daughter-in-law check her toes for her. Her daughter-in-law found a large ulceration there on the medial side of the left second toe. She states that pain became worse she started noticing heat redness and swelling. Her physician treated her with outpatient with Bactrim however infections symptoms did not resolve sent her for MR after failure of outpatient therapy. She denies fevers chills nausea vomiting. She states that her blood sugars have been running about 200. She denies any chest pain shortness of breath or diaphoresis.   The history is provided by the patient and medical records. No language interpreter was used.    Past Medical History  Diagnosis Date  . GERD (gastroesophageal reflux disease)   . PUD (peptic ulcer disease)   . Anemia   . Hypercholesterolemia   . Hypertension   . S/P endoscopy Aug 2011    3 superficial gastric ulcers, NSAID-induced  . S/P colonoscopy Sept 2011    left-sided diverticula, tubular adenoma  . Coronary artery disease   . Shortness of breath   . Diabetes mellitus     insulin dependent  . Headache     rare migraines  . Cancer     hx of skin cancer  . Arthritis     Past Surgical  History  Procedure Date  . Tonsillectomy   . Appendectomy   . Eye surgery     cataracts  . Cardiac stents 07/19/2011  . Cardiac catheterization 07/19/2011    No family history on file.  History  Substance Use Topics  . Smoking status: Never Smoker   . Smokeless tobacco: Never Used  . Alcohol Use: No    OB History    Grav Para Term Preterm Abortions TAB SAB Ect Mult Living                  Review of Systems  Constitutional: Negative for fever and chills.  Respiratory: Negative for cough, chest tightness and shortness of breath.   Cardiovascular: Negative for chest pain and palpitations.  Gastrointestinal: Negative for nausea, vomiting, abdominal pain and constipation.  Genitourinary: Negative for dysuria.  Neurological: Negative for headaches.    Allergies  Aspirin  Home Medications   Current Outpatient Rx  Name Route Sig Dispense Refill  . ASPIRIN 81 MG PO CHEW Oral Chew 1 tablet (81 mg total) by mouth daily.    Marland Kitchen CARVEDILOL 6.25 MG PO TABS Oral Take 6.25 mg by mouth 2 (two) times daily.    . CYCLOBENZAPRINE HCL 10 MG PO TABS Oral Take 10 mg by mouth every 8 (eight) hours as needed. For muscle spasms    . INSULIN GLARGINE 100 UNIT/ML [place] SOLN Subcutaneous Inject 20 Units into  the skin at bedtime.    Marland Kitchen LISINOPRIL 20 MG PO TABS Oral Take 20 mg by mouth daily.    Marland Kitchen LOSARTAN POTASSIUM 50 MG PO TABS Oral Take 50 mg by mouth daily.    Marland Kitchen FISH OIL 1200 MG PO CAPS Oral Take 1 capsule by mouth daily.     Marland Kitchen OMEPRAZOLE 20 MG PO CPDR Oral Take 20 mg by mouth 2 (two) times daily.     Marland Kitchen PRASUGREL HCL 10 MG PO TABS Oral Take 1 tablet (10 mg total) by mouth daily. 30 tablet 0  . SIMVASTATIN 20 MG PO TABS Oral Take 20 mg by mouth every evening.    . SULFAMETHOXAZOLE-TMP DS 800-160 MG PO TABS Oral Take 1 tablet by mouth Twice daily. For 14 days; Start date 10/14/11    . ACETAMINOPHEN 500 MG PO TABS Oral Take 1,000 mg by mouth 2 (two) times daily. For pain      BP 164/49  Pulse 54   Temp 97.7 F (36.5 C) (Oral)  Resp 13  SpO2 100%  Physical Exam  Nursing note and vitals reviewed. Constitutional: She is oriented to person, place, and time. She appears well-developed and well-nourished. No distress.  HENT:  Head: Normocephalic and atraumatic.  Eyes: Conjunctivae are normal. No scleral icterus.  Neck: Normal range of motion. Neck supple.  Cardiovascular: Normal rate.  Exam reveals gallop. Exam reveals no friction rub.   No murmur heard. Abdominal: Soft. Bowel sounds are normal. There is no tenderness. There is no guarding.  Musculoskeletal: Normal range of motion.       There is a macerated and necrotic looking circular ulceration on the atrial side of the left second toe surrounding toe is edematous and erythematous. It is tender to palpation no discharge from wound noted  Neurological: She is alert and oriented to person, place, and time.  Skin: Skin is warm and dry.    ED Course  Procedures (including critical care time)  Labs Reviewed  BASIC METABOLIC PANEL - Abnormal; Notable for the following:    Sodium 131 (*)     Potassium 6.3 (*)     CO2 18 (*)     Glucose, Bld 270 (*)     BUN 53 (*)     Creatinine, Ser 1.97 (*)     GFR calc non Af Amer 23 (*)     GFR calc Af Amer 27 (*)     All other components within normal limits  CBC WITH DIFFERENTIAL - Abnormal; Notable for the following:    RBC 3.77 (*)     Hemoglobin 11.4 (*)     HCT 34.2 (*)     All other components within normal limits   Mr Toes Left W/o Cm  10/26/2011  *RADIOLOGY REPORT*  Clinical Data: Nonhealing ulcer on the left second toe.  MRI OF THE LEFT TOES WITHOUT CONTRAST  Technique:  Multiplanar, multisequence MR imaging was performed. No intravenous contrast was administered.  Comparison: None.  Findings: There is intense marrow edema about the PIP joint of the second toe.  Edema extends into the proximal phalanx for 1.5 cm. Ulceration on the second toe is noted.  No fluid collection to  suggest abscess is identified. Soft tissue edema about the second toe is identified.  There is also edema about the dorsum of the foot.  Intrinsic musculature is atrophied.  No tendon tear is identified.  No mass.  No other evidence of infectious or inflammatory process is identified.  The patient has some first MTP degenerative disease.  IMPRESSION: Findings consistent with osteomyelitis about the PIP joint of the second toe as described above.  Negative for abscess.   Original Report Authenticated By: Bernadene Bell. D'ALESSIO, M.D.    . Patient noted to have hyperkalemia of 6.3 with peaked T waves on EKG. She also has acute renal failure. Started on calcium chloride, sodium bicarbonate, dextrose, insulin, and fluid bolus. Plan is to give fluids and recheck potassium level  I consulted with pharmacy who suggested to start vancomycin. Dr. Denton Lank also wanted to add Zosyn. He was started on both. Dr. spinal has consulted with tight hospitalists who have agreed to admit the patient for her hyperkalemia and osteomyelitis.   1. Osteomyelitis   2. Renal insufficiency   3. Hyperkalemia   4. Dehydration   5. ARF (acute renal failure)   6. DM (diabetes mellitus), type 2       MDM  Patient will be admitted to the hospital for management. Patient made aware, all questions answered thoroughly. Patient agrees with plan.       Arthor Captain, PA-C 10/27/11 0202

## 2011-10-26 NOTE — ED Notes (Signed)
Pt had MRI of left foot today and shows osteomyelitis to left second toe that has wound with decreased healing.  Sent here for treatment

## 2011-10-26 NOTE — ED Notes (Signed)
Pharmacy contacted regarding calcium drip.

## 2011-10-26 NOTE — ED Notes (Signed)
EKG done in triage at 1916.

## 2011-10-26 NOTE — H&P (Signed)
Claudia [name] is an 78 y.o. female. Patient was seen and examined on October 26, 2011 at 11:25 PM. PCP - Dr. Albin Felling. Chief Complaint: Abdominal MRI of the toe. HPI: 77 year-old female with history of CAD status post stenting, hypertension, hyperlipidemia and diabetes mellitus type 2 on insulin started taking some pain and redness of the left foot on the distal aspect 2 weeks ago. Her PCP started on oral antibiotics and lanced lesion on the left second toe. This was nonhealing. For which an MRI was done which showed osteomyelitis and patient was instructed to come to the ER. In the ER patient was in addition of hyperkalemia with acute renal failure. Patient was given Kayexalate and calcium gluconate IV insulin and D50. Patient has been admitted for further management. Patient denies any fever chills. Denies any pain at the site of the osteomyelitis at this time. Initially 2 weeks ago she had erythema of that area which has resolved since started taking antibiotics. Patient denies any nausea vomiting or diarrhea. Denies any chest pain or shortness of breath.  Past Medical History  Diagnosis Date  . GERD (gastroesophageal reflux disease)   . PUD (peptic ulcer disease)   . Anemia   . Hypercholesterolemia   . Hypertension   . S/P endoscopy Aug 2011    3 superficial gastric ulcers, NSAID-induced  . S/P colonoscopy Sept 2011    left-sided diverticula, tubular adenoma  . Coronary artery disease   . Shortness of breath   . Diabetes mellitus     insulin dependent  . Headache     rare migraines  . Cancer     hx of skin cancer  . Arthritis     Past Surgical History  Procedure Date  . Tonsillectomy   . Appendectomy   . Eye surgery     cataracts  . Cardiac stents 07/19/2011  . Cardiac catheterization 07/19/2011    History reviewed. No pertinent family history. Social History:  reports that she has never smoked. She has never used smokeless tobacco. She reports that she does not drink  alcohol or use illicit drugs.  Allergies:  Allergies  Allergen Reactions  . Aspirin Other (See Comments)    Intolerant to high doses     (Not in a hospital admission)  Results for orders placed during the hospital encounter of 10/26/11 (from the past 48 hour(s))  BASIC METABOLIC PANEL     Status: Abnormal   Collection Time   10/26/11  6:13 PM      Component Value Range Comment   Sodium 131 (*) 135 - 145 mEq/L    Potassium 6.3 (*) 3.5 - 5.1 mEq/L    Chloride 101  96 - 112 mEq/L    CO2 18 (*) 19 - 32 mEq/L    Glucose, Bld 270 (*) 70 - 99 mg/dL    BUN 53 (*) 6 - 23 mg/dL    Creatinine, Ser 1.61 (*) 0.50 - 1.10 mg/dL    Calcium 9.8  8.4 - 09.6 mg/dL    GFR calc non Af Amer 23 (*) >90 mL/min    GFR calc Af Amer 27 (*) >90 mL/min   CBC WITH DIFFERENTIAL     Status: Abnormal   Collection Time   10/26/11  6:13 PM      Component Value Range Comment   WBC 4.0  4.0 - 10.5 K/uL    RBC 3.77 (*) 3.87 - 5.11 MIL/uL    Hemoglobin 11.4 (*) 12.0 - 15.0 g/dL  HCT 34.2 (*) 36.0 - 46.0 %    MCV 90.7  78.0 - 100.0 fL    MCH 30.2  26.0 - 34.0 pg    MCHC 33.3  30.0 - 36.0 g/dL    RDW 16.1  09.6 - 04.5 %    Platelets 214  150 - 400 K/uL    Neutrophils Relative 43  43 - 77 %    Neutro Abs 1.7  1.7 - 7.7 K/uL    Lymphocytes Relative 44  12 - 46 %    Lymphs Abs 1.8  0.7 - 4.0 K/uL    Monocytes Relative 8  3 - 12 %    Monocytes Absolute 0.3  0.1 - 1.0 K/uL    Eosinophils Relative 4  0 - 5 %    Eosinophils Absolute 0.2  0.0 - 0.7 K/uL    Basophils Relative 1  0 - 1 %    Basophils Absolute 0.0  0.0 - 0.1 K/uL    Mr Toes Left W/o Cm  10/26/2011  *RADIOLOGY REPORT*  Clinical Data: Nonhealing ulcer on the left second toe.  MRI OF THE LEFT TOES WITHOUT CONTRAST  Technique:  Multiplanar, multisequence MR imaging was performed. No intravenous contrast was administered.  Comparison: None.  Findings: There is intense marrow edema about the PIP joint of the second toe.  Edema extends into the proximal  phalanx for 1.5 cm. Ulceration on the second toe is noted.  No fluid collection to suggest abscess is identified. Soft tissue edema about the second toe is identified.  There is also edema about the dorsum of the foot.  Intrinsic musculature is atrophied.  No tendon tear is identified.  No mass.  No other evidence of infectious or inflammatory process is identified.  The patient has some first MTP degenerative disease.  IMPRESSION: Findings consistent with osteomyelitis about the PIP joint of the second toe as described above.  Negative for abscess.   Original Report Authenticated By: Bernadene Bell. Maricela Curet, M.D.     Review of Systems  Constitutional: Negative.   HENT: Negative.   Eyes: Negative.   Respiratory: Negative.   Cardiovascular: Negative.   Gastrointestinal: Negative.   Genitourinary: Negative.   Musculoskeletal:       Left foot second toe ulcers.  Skin: Negative.   Neurological: Negative.   Endo/Heme/Allergies: Negative.   Psychiatric/Behavioral: Negative.     Blood pressure 172/45, pulse 85, temperature 97.7 F (36.5 C), temperature source Oral, resp. rate 14, SpO2 99.00%. Physical Exam  Constitutional: She is oriented to person, place, and time. She appears well-developed and well-nourished. No distress.  HENT:  Head: Normocephalic and atraumatic.  Right Ear: External ear normal.  Left Ear: External ear normal.  Nose: Nose normal.  Mouth/Throat: Oropharynx is clear and moist. No oropharyngeal exudate.  Eyes: Conjunctivae are normal. Pupils are equal, round, and reactive to light. Right eye exhibits no discharge. Left eye exhibits no discharge. No scleral icterus.  Neck: Neck supple.  Cardiovascular: Normal rate and regular rhythm.   Respiratory: Effort normal and breath sounds normal. No respiratory distress. She has no wheezes. She has no rales.  GI: Soft. Bowel sounds are normal. She exhibits no distension. There is no tenderness. There is no rebound.  Musculoskeletal:         Left foot second on the media aspect there is a half centimeter ulcer with no discharge.  Neurological: She is alert and oriented to person, place, and time.       Moves  all extremities.  Skin: She is not diaphoretic.  Psychiatric: Her behavior is normal.     Assessment/Plan #1. Osteomyelitis of the left foot second toe - patient was started on vancomycin and Zosyn by the ER physician. At this time we will continue vancomycin and Cipro for pharmacy to dose. May consult orthopedics in a.m. #2. Acute renal failure with hyperkalemia and non-anion gap metabolic acidosis - check urinalysis. Patient is on ARB and lisinopril and patient also has been started on Bactrim which all could have been contributing to her renal failure. Stop all these medications at this time. Patient has been started IV fluids which will continue. As mentioned earlier patient was given medications for hyperkalemia and we'll recheck her metabolic panel in a few hours from now to see the creatinine and potassium trends. Patient does have some T-wave changes in the EKG which are compatible to the old EKG. Her acute renal failure probably is from the medications. Closely follow intake output. #3. Diabetes mellitus type 2 uncontrolled - patient states of recently her diabetes has been uncontrolled. She states she has not been adhering to the diet. Check hemoglobin A1c. Continue her present home medication with stressful coverage. May need further medication adjustments in the hospital for diabetes. #4. Hypertension - since patient's ARB and ACE inhibitors on hold I have placed patient on clear IV hydralazine for systolic blood pressure more than 160. #5. CAD status post stenting - presently chest pain-free. Continue antiplatelet agents and beta blockers.  CODE STATUS - full code.  Eduard Clos. 10/26/2011, 11:41 PM

## 2011-10-26 NOTE — ED Notes (Signed)
Pt placed on continuous pulse ox and cardiac monitor.

## 2011-10-27 ENCOUNTER — Encounter (HOSPITAL_COMMUNITY): Payer: Self-pay | Admitting: *Deleted

## 2011-10-27 LAB — URINALYSIS, ROUTINE W REFLEX MICROSCOPIC
Nitrite: NEGATIVE
Specific Gravity, Urine: 1.01 (ref 1.005–1.030)
Urobilinogen, UA: 0.2 mg/dL (ref 0.0–1.0)

## 2011-10-27 LAB — BASIC METABOLIC PANEL
BUN: 38 mg/dL — ABNORMAL HIGH (ref 6–23)
Calcium: 9.3 mg/dL (ref 8.4–10.5)
Chloride: 106 mEq/L (ref 96–112)
Creatinine, Ser: 1.53 mg/dL — ABNORMAL HIGH (ref 0.50–1.10)
GFR calc Af Amer: 30 mL/min — ABNORMAL LOW (ref >90)
GFR calc non Af Amer: 26 mL/min — ABNORMAL LOW (ref >90)
GFR calc non Af Amer: 32 mL/min — ABNORMAL LOW (ref >90)
Glucose, Bld: 249 mg/dL — ABNORMAL HIGH (ref 70–99)
Glucose, Bld: 128 mg/dL — ABNORMAL HIGH (ref 70–99)
Potassium: 5.6 mEq/L — ABNORMAL HIGH (ref 3.5–5.1)
Sodium: 136 mEq/L (ref 135–145)

## 2011-10-27 LAB — HEMOGLOBIN A1C: Hgb A1c MFr Bld: 8.2 % — ABNORMAL HIGH (ref <5.7)

## 2011-10-27 LAB — GLUCOSE, CAPILLARY: Glucose-Capillary: 187 mg/dL — ABNORMAL HIGH (ref 70–99)

## 2011-10-27 LAB — URINE MICROSCOPIC-ADD ON

## 2011-10-27 LAB — CBC
Hemoglobin: 10.6 g/dL — ABNORMAL LOW (ref 12.0–15.0)
MCH: 30 pg (ref 26.0–34.0)
MCHC: 32.9 g/dL (ref 30.0–36.0)
Platelets: 188 10*3/uL (ref 150–400)
RDW: 12.9 % (ref 11.5–15.5)

## 2011-10-27 MED ORDER — SODIUM CHLORIDE 0.9 % IV SOLN
1250.0000 mg | INTRAVENOUS | Status: DC
  Administered 2011-10-27: 1250 mg via INTRAVENOUS
  Filled 2011-10-27 (×2): qty 1250

## 2011-10-27 MED ORDER — ACETAMINOPHEN 650 MG RE SUPP: 650.0000 mg | Freq: Four times a day (QID) | RECTAL | Status: DC | PRN

## 2011-10-27 MED ORDER — ONDANSETRON HCL 4 MG/2ML IJ SOLN: 4.0000 mg | Freq: Four times a day (QID) | INTRAMUSCULAR | Status: DC | PRN

## 2011-10-27 MED ORDER — FISH OIL 1200 MG PO CAPS: 1.0000 | ORAL_CAPSULE | Freq: Every day | ORAL | Status: DC

## 2011-10-27 MED ORDER — OMEGA-3-ACID ETHYL ESTERS 1 G PO CAPS
1.0000 g | ORAL_CAPSULE | Freq: Every day | ORAL | Status: DC
  Administered 2011-10-27 – 2011-10-28 (×2): 1 g via ORAL
  Filled 2011-10-27 (×2): qty 1

## 2011-10-27 MED ORDER — SODIUM CHLORIDE 0.9 % IV SOLN
INTRAVENOUS | Status: AC
  Administered 2011-10-27: 03:00:00 via INTRAVENOUS

## 2011-10-27 MED ORDER — ASPIRIN 81 MG PO CHEW
81.0000 mg | CHEWABLE_TABLET | Freq: Every day | ORAL | Status: DC
  Administered 2011-10-27 – 2011-10-28 (×2): 81 mg via ORAL
  Filled 2011-10-27 (×2): qty 1

## 2011-10-27 MED ORDER — SODIUM CHLORIDE 0.9 % IJ SOLN
3.0000 mL | Freq: Two times a day (BID) | INTRAMUSCULAR | Status: DC
  Administered 2011-10-28: 3 mL via INTRAVENOUS

## 2011-10-27 MED ORDER — PANTOPRAZOLE SODIUM 40 MG PO TBEC
40.0000 mg | DELAYED_RELEASE_TABLET | Freq: Every day | ORAL | Status: DC
  Administered 2011-10-27 – 2011-10-28 (×2): 40 mg via ORAL
  Filled 2011-10-27 (×2): qty 1

## 2011-10-27 MED ORDER — INSULIN ASPART 100 UNIT/ML ~~LOC~~ SOLN
0.0000 [IU] | Freq: Three times a day (TID) | SUBCUTANEOUS | Status: DC
  Administered 2011-10-27: 2 [IU] via SUBCUTANEOUS
  Administered 2011-10-27: 3 [IU] via SUBCUTANEOUS
  Administered 2011-10-27 – 2011-10-28 (×2): 2 [IU] via SUBCUTANEOUS
  Administered 2011-10-28: 5 [IU] via SUBCUTANEOUS

## 2011-10-27 MED ORDER — PRASUGREL HCL 10 MG PO TABS
10.0000 mg | ORAL_TABLET | Freq: Every day | ORAL | Status: DC
  Administered 2011-10-27 – 2011-10-28 (×2): 10 mg via ORAL
  Filled 2011-10-27 (×2): qty 1

## 2011-10-27 MED ORDER — INSULIN GLARGINE 100 UNIT/ML ~~LOC~~ SOLN
20.0000 [IU] | Freq: Every day | SUBCUTANEOUS | Status: DC
  Administered 2011-10-27: 20 [IU] via SUBCUTANEOUS

## 2011-10-27 MED ORDER — ACETAMINOPHEN 325 MG PO TABS: 650.0000 mg | ORAL_TABLET | Freq: Four times a day (QID) | ORAL | Status: DC | PRN

## 2011-10-27 MED ORDER — CARVEDILOL 6.25 MG PO TABS
6.2500 mg | ORAL_TABLET | Freq: Two times a day (BID) | ORAL | Status: DC
  Administered 2011-10-27 – 2011-10-28 (×3): 6.25 mg via ORAL
  Filled 2011-10-27 (×5): qty 1

## 2011-10-27 MED ORDER — CIPROFLOXACIN IN D5W 400 MG/200ML IV SOLN
400.0000 mg | Freq: Two times a day (BID) | INTRAVENOUS | Status: DC
  Administered 2011-10-27 – 2011-10-28 (×3): 400 mg via INTRAVENOUS
  Filled 2011-10-27 (×4): qty 200

## 2011-10-27 MED ORDER — CYCLOBENZAPRINE HCL 10 MG PO TABS: 10.0000 mg | ORAL_TABLET | Freq: Three times a day (TID) | ORAL | Status: DC | PRN

## 2011-10-27 MED ORDER — SIMVASTATIN 20 MG PO TABS
20.0000 mg | ORAL_TABLET | Freq: Every evening | ORAL | Status: DC
  Administered 2011-10-27: 20 mg via ORAL
  Filled 2011-10-27 (×2): qty 1

## 2011-10-27 MED ORDER — ONDANSETRON HCL 4 MG PO TABS: 4.0000 mg | ORAL_TABLET | Freq: Four times a day (QID) | ORAL | Status: DC | PRN

## 2011-10-27 NOTE — ED Notes (Addendum)
lab called regarding CMP result.

## 2011-10-27 NOTE — Consult Note (Signed)
Patient ID: Claudia [name] MRN: [id number] DOB/AGE: 1931/05/30 78 y.o.  Admit date: 10/26/2011  Admission Diagnoses:  Principal Problem:  *Osteomyelitis of left second toe; medial aspect Active Problems:  DM (diabetes mellitus), type 2  ARF (acute renal failure)  Hyperkalemia   HPI: 78 year-old female with history of CAD status post stenting, hypertension, hyperlipidemia and diabetes mellitus type 2 on insulin.  She states that she saw her PCP, Dr. Tanya Nones [facility]) about 2 weeks ago for left foot second toe pain and redness.  He initially treated her with oral antibiotics and lanced the lesion.  The area was not healing and she followed up with him this past Monday.  Subsequent MRI was ordered which demonstrated osteomyelitis of the area (please see report for specifics).  Patient was instructed to follow up with [facility]    Upon arrival to the ED she was found to be hyperkalemic with acute renal failure.  She is being treated by the medical team for this.  As it relates to the toe, she is currently being treated with both zosyn and vancomycin.  An orthopedic consult was called for further/definitive management.        Past Medical History: Past Medical History  Diagnosis Date  . GERD (gastroesophageal reflux disease)   . PUD (peptic ulcer disease)   . Anemia   . Hypercholesterolemia   . Hypertension   . S/P endoscopy Aug 2011    3 superficial gastric ulcers, NSAID-induced  . S/P colonoscopy Sept 2011    left-sided diverticula, tubular adenoma  . Coronary artery disease   . Shortness of breath   . Diabetes mellitus     insulin dependent  . Headache     rare migraines  . Cancer     hx of skin cancer  . Arthritis     Surgical History: Past Surgical History  Procedure Date  . Tonsillectomy   . Appendectomy   . Eye surgery     cataracts  . Cardiac stents 07/19/2011  . Cardiac catheterization 07/19/2011    Family History: History  reviewed. No pertinent family history.  Social History: History   Social History  . Marital Status: Widowed    Spouse Name: N/A    Number of Children: N/A  . Years of Education: N/A   Occupational History  . Not on file.   Social History Main Topics  . Smoking status: Never Smoker   . Smokeless tobacco: Never Used  . Alcohol Use: No  . Drug Use: No  . Sexually Active: No   Other Topics Concern  . Not on file   Social History Narrative  . No narrative on file    Allergies: Aspirin  Medications: ASPIRIN 81 MG PO CHEW  Oral  Chew 1 tablet (81 mg total) by mouth daily.  Marland Kitchen  CARVEDILOL 6.25 MG PO TABS  Oral  Take 6.25 mg by mouth 2 (two) times daily.  .  CYCLOBENZAPRINE HCL 10 MG PO TABS  Oral  Take 10 mg by mouth every 8 (eight) hours as needed. For muscle spasms  .  INSULIN GLARGINE 100 UNIT/ML [place] SOLN  Subcutaneous  Inject 20 Units into the skin at bedtime.  Marland Kitchen  LISINOPRIL 20 MG PO TABS  Oral  Take 20 mg by mouth daily.  Marland Kitchen  LOSARTAN POTASSIUM 50 MG PO TABS  Oral  Take 50 mg by mouth daily.  Marland Kitchen  FISH OIL 1200 MG PO CAPS  Oral  Take 1 capsule by mouth daily.  Marland Kitchen  OMEPRAZOLE 20 MG PO CPDR  Oral  Take 20 mg by mouth 2 (two) times daily.  Marland Kitchen  PRASUGREL HCL 10 MG PO TABS  Oral  Take 1 tablet (10 mg total) by mouth daily.  30 tablet  0  .  SIMVASTATIN 20 MG PO TABS  Oral  Take 20 mg by mouth every evening.  .  SULFAMETHOXAZOLE-TMP DS 800-160 MG PO TABS  Oral  Take 1 tablet by mouth Twice daily. For 14 days; Start date 10/14/11  .  ACETAMINOPHEN 500 MG PO TABS  Oral  Take 1,000 mg by mouth 2 (two) times daily. For pain    Vital Signs: Patient Vitals for the past 24 hrs:  BP Temp Temp src Pulse Resp SpO2 Height Weight  10/27/11 1000 131/66 mmHg 97.7 F (36.5 C) Oral 69  17  94 % - -  10/27/11 0521 156/69 mmHg 97.9 F (36.6 C) Oral 72  18  97 % - -  10/27/11 0132 166/73 mmHg 98.2 F (36.8 C) Oral 67  19  96 % 5\' 7"  (1.702 m) 87.635 kg (193 lb  3.2 oz)  10/27/11 0013 178/57 mmHg 97.8 F (36.6 C) Oral - 21  99 % - -  10/26/11 2130 172/45 mmHg - - 85  14  99 % - -  10/26/11 2100 163/59 mmHg - - 53  12  100 % - -  10/26/11 2038 164/49 mmHg 97.7 F (36.5 C) Oral - 13  100 % - -  10/26/11 1743 156/45 mmHg 97.6 F (36.4 C) Oral 54  16  97 % - -    Radiology: Mr Toes Left W/o Cm  10/26/2011  *RADIOLOGY REPORT*  Clinical Data: Nonhealing ulcer on the left second toe.  MRI OF THE LEFT TOES WITHOUT CONTRAST  Technique:  Multiplanar, multisequence MR imaging was performed. No intravenous contrast was administered.  Comparison: None.  Findings: There is intense marrow edema about the PIP joint of the second toe.  Edema extends into the proximal phalanx for 1.5 cm. Ulceration on the second toe is noted.  No fluid collection to suggest abscess is identified. Soft tissue edema about the second toe is identified.  There is also edema about the dorsum of the foot.  Intrinsic musculature is atrophied.  No tendon tear is identified.  No mass.  No other evidence of infectious or inflammatory process is identified.  The patient has some first MTP degenerative disease.  IMPRESSION: Findings consistent with osteomyelitis about the PIP joint of the second toe as described above.  Negative for abscess.   Original Report Authenticated By: Bernadene Bell. D'ALESSIO, M.D.     Labs:  [facility] 10/27/11 0300 10/26/11 1813  WBC 3.6* 4.0  RBC 3.53* 3.77*  HCT 32.2* 34.2*  PLT 188 214    Basename 10/27/11 0300 10/26/11 2228  NA 141 136  K 5.4* 5.6*  CL 111 106  CO2 22 21  BUN 38* 47*  CREATININE 1.53* 1.79*  GLUCOSE 128* 249*  CALCIUM 9.3 10.2   No results found for this basename: LABPT:2,INR:2 in the last 72 hours  Review of Systems: She denies recent illness.  Denies fevers, chills, SOB or cough.  Denies chest pain or palpatations.  Denies nausea, vomiting or diarrhea.  There is no pain of the toe currently.  She states she ambulates with full use of both  feet.  There was a noticeable amount of swelling that has resolved since  beginning the antibiotics and having the ulcer lanced.      Physical Exam: She is alert and oriented in NAD.  She is able to dorsiflex and plantarflex the ankle and the toes.  Sensation and peripheral pulses are intact.  Compartments are soft and nontender.  There is an open ulcerated area on the medial aspect of the left 2nd toe.   I am not able to appreciate much swelling.  It is not terribly tender to palpation.  There is no drainage.  I am unable to express any discharge with palpation.       Assessment and Plan: Patient with MRI findings consistent with osteomyelitis left foot about the PIP joint of the second toe, negative for abscess (please see the report above for specifics).  I have reviewed the patients clinical picture, exam findings and MRI results with Dr. Shon Baton.   At this time, will defer medical issues to medicine and consult Dr. Victorino Dike for definitive management of her toe.      [place]a [place] for Dr. Venita Lick [facility] [phone] 10/27/2011, 1:53 PM

## 2011-10-27 NOTE — Consult Note (Signed)
Agree with above Will discuss case with my partner Dr Victorino Dike Will defer definitive management to Dr Victorino Dike No evidence of sepsis or abscess - no need for acute surgical management Recommend - splint and NWB on effected extremity

## 2011-10-27 NOTE — Progress Notes (Signed)
Pt transferred from the ED around 0230, admitted to Rm [id number]. Pt comes from home with son. She is alert and oriented. Ambulatory with standby assist. Small ulcer to Left second toe(1x1.5x0) with minimal drainage, foam dressing applied. Placed on telemetry, running NSR. Oriented to room, instructed to call for assistance before getting out of bed. Resting comfortably at this time, will continue to monitor  [facility]

## 2011-10-27 NOTE — Progress Notes (Signed)
ANTIBIOTIC CONSULT NOTE - INITIAL  Pharmacy Consult for vancomycin + cipro Indication: osteomyelitis  Allergies  Allergen Reactions  . Aspirin Other (See Comments)    Intolerant to high doses    Patient Measurements: Weight: 193 lb 3.2 oz (87.635 kg)  Vital Signs: Temp: 98.2 F (36.8 C) (08/22 0132) Temp src: Oral (08/22 0132) BP: 166/73 mmHg (08/22 0132) Pulse Rate: 67  (08/22 0132) Intake/Output from previous day:   Intake/Output from this shift:    Labs:  [facility] [place] 10/26/11 2228 10/26/11 1813  WBC -- 4.0  HGB -- 11.4*  PLT -- 214  LABCREA -- --  CREATININE 1.79* 1.97*   The CrCl is unknown because both a height and weight (above a minimum accepted value) are required for this calculation. No results found for this basename: VANCOTROUGH:2,VANCOPEAK:2,VANCORANDOM:2,GENTTROUGH:2,GENTPEAK:2,GENTRANDOM:2,TOBRATROUGH:2,TOBRAPEAK:2,TOBRARND:2,AMIKACINPEAK:2,AMIKACINTROU:2,AMIKACIN:2, in the last 72 hours   Microbiology: No results found for this or any previous visit (from the past 720 hour(s)).  Medical History: Past Medical History  Diagnosis Date  . GERD (gastroesophageal reflux disease)   . PUD (peptic ulcer disease)   . Anemia   . Hypercholesterolemia   . Hypertension   . S/P endoscopy Aug 2011    3 superficial gastric ulcers, NSAID-induced  . S/P colonoscopy Sept 2011    left-sided diverticula, tubular adenoma  . Coronary artery disease   . Shortness of breath   . Diabetes mellitus     insulin dependent  . Headache     rare migraines  . Cancer     hx of skin cancer  . Arthritis     Medications:  Prescriptions prior to admission  Medication Sig Dispense Refill  . aspirin 81 MG chewable tablet Chew 1 tablet (81 mg total) by mouth daily.      . carvedilol (COREG) 6.25 MG tablet Take 6.25 mg by mouth 2 (two) times daily.      . cyclobenzaprine (FLEXERIL) 10 MG tablet Take 10 mg by mouth every 8 (eight) hours as needed. For muscle spasms      . insulin  glargine (LANTUS SOLOSTAR) 100 UNIT/ML injection Inject 20 Units into the skin at bedtime.      Marland Kitchen lisinopril (PRINIVIL,ZESTRIL) 20 MG tablet Take 20 mg by mouth daily.      Marland Kitchen losartan (COZAAR) 50 MG tablet Take 50 mg by mouth daily.      . Omega-3 Fatty Acids (FISH OIL) 1200 MG CAPS Take 1 capsule by mouth daily.       Marland Kitchen omeprazole (PRILOSEC) 20 MG capsule Take 20 mg by mouth 2 (two) times daily.       . prasugrel (EFFIENT) 10 MG TABS Take 1 tablet (10 mg total) by mouth daily.  30 tablet  0  . simvastatin (ZOCOR) 20 MG tablet Take 20 mg by mouth every evening.      . sulfamethoxazole-trimethoprim (BACTRIM DS) 800-160 MG per tablet Take 1 tablet by mouth Twice daily. For 14 days; Start date 10/14/11      . acetaminophen (TYLENOL) 500 MG tablet Take 1,000 mg by mouth 2 (two) times daily. For pain       Assessment: 4 yof admitted after an MRI of her toe revealed osteomyelitis. She had recently been started on bactrim for redness/pain in her foot. She is afebrile and WBC is WNL. Of note, pt has acute renal dysfunction and initially had a Scr of 1.97. This has improved a bit to 1.79 after hydration. She has received 1gm vanc and 3.375gm zosyn in the ED.  Goal of Therapy:  Vancomycin trough level 15-20 mcg/ml  Plan:  1. Cipro 400mg  IV Q12H 2. Vancomycin 1250mg  IV Q24H 3. F/u renal function, may need to adjust doses 4. F/u c&s and trough at [facility] Datha Kissinger, Drake Leach 10/27/2011,3:17 AM

## 2011-10-27 NOTE — ED Notes (Signed)
Lab again called regarding cmp. RN talked with admitting MD regarding kayexlate which was to be given after BMP resulted per PA.  RN on floor to call him when resulted; kayexalate held and sent upstairs with pt for RN after BMP resulted. This plan communicated with RN on floor.

## 2011-10-27 NOTE — Progress Notes (Signed)
Subjective: Patient has no complaints this morning. I spoke with patient explained to her that continued hospitalization would be necessary if orthopedic surgery decides the need for surgical intervention.However I will speak with infectious diseases for choice of antibiotics as no cultures are available to guide the choice of antibiotics. Objective: Filed Vitals:   10/27/11 0521 10/27/11 1000 10/27/11 1400 10/27/11 1800  BP: 156/69 131/66 136/68 132/74  Pulse: 72 69 60 82  Temp: 97.9 F (36.6 C) 97.7 F (36.5 C) 97.9 F (36.6 C) 97.7 F (36.5 C)  TempSrc: Oral Oral Oral Oral  Resp: 18 17 18 18   Height:      Weight:      SpO2: 97% 94% 96% 97%   Weight change:   Intake/Output Summary (Last 24 hours) at 10/27/11 2109 Last data filed at 10/27/11 1853  Gross per 24 hour  Intake 2727.92 ml  Output   3500 ml  Net -772.08 ml    General: Alert, awake, oriented x3, in no acute distress.  HEENT: [place]/AT PEERL, EOMI Heart: Regular rate and rhythm, without murmurs, rubs, gallops, PMI non-displaced, no heaves or thrills on palpation.  Lungs: Clear to auscultation, no wheezing or rhonchi noted. No increased vocal fremitus resonant to percussion  Abdomen: Soft, nontender, nondistended, positive bowel sounds, no masses no hepatosplenomegaly noted..  Neuro: No focal neurological deficits noted cranial nerves II through XII grossly intact. DTRs 2+ bilaterally upper and lower extremities. Strength functional in bilateral upper and lower extremities. Musculoskeletal: No warm swelling or erythema around joints, no spinal tenderness noted. Open ulcer on the medial aspect of the second toe.   Lab Results:  Basename 10/27/11 0300 10/26/11 2228  NA 141 136  K 5.4* 5.6*  CL 111 106  CO2 22 21  GLUCOSE 128* 249*  BUN 38* 47*  CREATININE 1.53* 1.79*  CALCIUM 9.3 10.2  MG -- --  PHOS -- --   No results found for this basename: AST:2,ALT:2,ALKPHOS:2,BILITOT:2,PROT:2,ALBUMIN:2 in the last 72  hours No results found for this basename: LIPASE:2,AMYLASE:2 in the last 72 hours  Basename 10/27/11 0300 10/26/11 1813  WBC 3.6* 4.0  NEUTROABS -- 1.7  HGB 10.6* 11.4*  HCT 32.2* 34.2*  MCV 91.2 90.7  PLT 188 214   No results found for this basename: CKTOTAL:3,CKMB:3,CKMBINDEX:3,TROPONINI:3 in the last 72 hours No components found with this basename: POCBNP:3 No results found for this basename: DDIMER:2 in the last 72 hours  Basename 10/27/11 0300  HGBA1C 8.2*   No results found for this basename: CHOL:2,HDL:2,LDLCALC:2,TRIG:2,CHOLHDL:2,LDLDIRECT:2 in the last 72 hours No results found for this basename: TSH,T4TOTAL,[name]3,T3FREE,THYROIDAB in the last 72 hours No results found for this basename: VITAMINB12:2,FOLATE:2,FERRITIN:2,TIBC:2,IRON:2,RETICCTPCT:2 in the last 72 hours  Micro Results: No results found for this or any previous visit (from the past 240 hour(s)).  Studies/Results: Mr Toes Left W/o Cm  10/26/2011  *RADIOLOGY REPORT*  Clinical Data: Nonhealing ulcer on the left second toe.  MRI OF THE LEFT TOES WITHOUT CONTRAST  Technique:  Multiplanar, multisequence MR imaging was performed. No intravenous contrast was administered.  Comparison: None.  Findings: There is intense marrow edema about the PIP joint of the second toe.  Edema extends into the proximal phalanx for 1.5 cm. Ulceration on the second toe is noted.  No fluid collection to suggest abscess is identified. Soft tissue edema about the second toe is identified.  There is also edema about the dorsum of the foot.  Intrinsic musculature is atrophied.  No tendon tear is identified.  No  mass.  No other evidence of infectious or inflammatory process is identified.  The patient has some first MTP degenerative disease.  IMPRESSION: Findings consistent with osteomyelitis about the PIP joint of the second toe as described above.  Negative for abscess.   Original Report Authenticated By: Bernadene Bell. Maricela Curet, M.D.      Medications: I have reviewed the patient's current medications. Scheduled Meds:   . sodium chloride   Intravenous STAT  . aspirin  81 mg Oral Daily  . calcium chloride  IV  1 g Intravenous Once  . carvedilol  6.25 mg Oral BID WC  . ciprofloxacin  400 mg Intravenous Q12H  . dextrose  25 g Intravenous Once  . insulin aspart  0-9 Units Subcutaneous TID WC  . insulin aspart  8 Units Intravenous Once  . insulin glargine  20 Units Subcutaneous QHS  . omega-3 acid ethyl esters  1 g Oral Daily  . pantoprazole  40 mg Oral Q1200  . piperacillin-tazobactam (ZOSYN)  IV  3.375 g Intravenous Once  . prasugrel  10 mg Oral Daily  . simvastatin  20 mg Oral QPM  . sodium bicarbonate  50 mEq Intravenous Once  . sodium chloride  1,000 mL Intravenous Once  . sodium chloride  500 mL Intravenous Once  . sodium chloride  3 mL Intravenous Q12H  . sodium polystyrene  15 g Oral Once  . vancomycin  1,250 mg Intravenous Q24H  . vancomycin  1,000 mg Intravenous Once  . DISCONTD: Fish Oil  1 capsule Oral Daily  . DISCONTD: insulin aspart  8 Units Intravenous Once  . DISCONTD: vancomycin  15 mg/kg Intravenous Once   Continuous Infusions:   . sodium chloride 125 mL/hr at 10/27/11 0312   PRN Meds:.acetaminophen, acetaminophen, cyclobenzaprine, ondansetron (ZOFRAN) IV, ondansetron Assessment/Plan: Patient Active Hospital Problem List: Osteomyelitis of toe (10/26/2011)   Assessment: I suspect that the patient will likely have medical management with any surgical intervention however I will await the opinion of orthopedic surgery.    DM (diabetes mellitus), type 2 (07/28/2011)   Assessment: Elevated hemoglobin A1c of 8.2 reflects poor chronic control of blood sugars however blood sugars are adequately controlled during this hospitalization   ARF (acute renal failure) (10/26/2011)   Assessment: Patient has acute renal failure with an elevated BUN and creatinine. However with hydration this is improved to a  BUN of 30 and a creatinine of 1.53. It is to be noted the patient has a baseline creatinine of 1.13 and BUN of 18 from 07/30/2011. I suspect that this is partially due to dehydrated state as well as the use of ACE inhibitors and possibly the effects of Bactrim which the patient has been on since 10/14/2011.    Hyperkalemia (10/26/2011)   Assessment: Patient is noted to have hyperkalemia potassium 6.3. This has since decreased her potassium of 5.4 this morning we'll continue to monitor and recheck in the a.m.      LOS: 1 day

## 2011-10-28 ENCOUNTER — Encounter (HOSPITAL_COMMUNITY): Payer: Medicare HMO

## 2011-10-28 LAB — CBC WITH DIFFERENTIAL/PLATELET
Basophils Absolute: 0 10*3/uL (ref 0.0–0.1)
Basophils Relative: 1 % (ref 0–1)
Hemoglobin: 11.5 g/dL — ABNORMAL LOW (ref 12.0–15.0)
MCHC: 34 g/dL (ref 30.0–36.0)
Monocytes Relative: 8 % (ref 3–12)
Neutro Abs: 2 10*3/uL (ref 1.7–7.7)
Neutrophils Relative %: 48 % (ref 43–77)
RDW: 12.6 % (ref 11.5–15.5)

## 2011-10-28 LAB — GLUCOSE, CAPILLARY
Glucose-Capillary: 183 mg/dL — ABNORMAL HIGH (ref 70–99)
Glucose-Capillary: 265 mg/dL — ABNORMAL HIGH (ref 70–99)

## 2011-10-28 LAB — BASIC METABOLIC PANEL
BUN: 22 mg/dL (ref 6–23)
GFR calc Af Amer: 55 mL/min — ABNORMAL LOW (ref >90)
GFR calc non Af Amer: 48 mL/min — ABNORMAL LOW (ref >90)
Potassium: 5.6 mEq/L — ABNORMAL HIGH (ref 3.5–5.1)

## 2011-10-28 MED ORDER — MOXIFLOXACIN HCL 400 MG PO TABS: 400.0000 mg | ORAL_TABLET | Freq: Every day | ORAL | Status: AC

## 2011-10-28 MED ORDER — MOXIFLOXACIN HCL 400 MG PO TABS
400.0000 mg | ORAL_TABLET | Freq: Every day | ORAL | Status: DC
  Filled 2011-10-28: qty 1

## 2011-10-28 NOTE — Discharge Summary (Signed)
Claudia [name] MRN: [id number] DOB/AGE: Jul 15, 1931 78 y.o.  Admit date: 10/26/2011 Discharge date: 10/28/2011  Primary Care Physician:  Leo Grosser, MD   Discharge Diagnoses:   Patient Active Problem List  Diagnosis  . ANEMIA  . GERD  . PUD, HX OF  . COLONIC POLYPS, ADENOMATOUS, HX OF  . CAD (coronary artery disease), native coronary artery  . Hyponatremia  . Nausea and vomiting  . Benign hypertension  . DM (diabetes mellitus), type 2  . Diarrhea  . Osteomyelitis of toe  . ARF (acute renal failure)  . Hyperkalemia    DISCHARGE MEDICATION: Medication List  As of 10/28/2011  9:48 AM   STOP taking these medications         lisinopril 20 MG tablet      sulfamethoxazole-trimethoprim 800-160 MG per tablet         TAKE these medications         acetaminophen 500 MG tablet   Commonly known as: TYLENOL   Take 1,000 mg by mouth 2 (two) times daily. For pain      aspirin 81 MG chewable tablet   Chew 1 tablet (81 mg total) by mouth daily.      carvedilol 6.25 MG tablet   Commonly known as: COREG   Take 6.25 mg by mouth 2 (two) times daily.      cyclobenzaprine 10 MG tablet   Commonly known as: FLEXERIL   Take 10 mg by mouth every 8 (eight) hours as needed. For muscle spasms      Fish Oil 1200 MG Caps   Take 1 capsule by mouth daily.      LANTUS SOLOSTAR 100 UNIT/ML injection   Generic drug: insulin glargine   Inject 20 Units into the skin at bedtime.      losartan 50 MG tablet   Commonly known as: COZAAR   Take 50 mg by mouth daily.      moxifloxacin 400 MG tablet   Commonly known as: AVELOX   Take 1 tablet (400 mg total) by mouth daily.      omeprazole 20 MG capsule   Commonly known as: PRILOSEC   Take 20 mg by mouth 2 (two) times daily.      prasugrel 10 MG Tabs   Commonly known as: EFFIENT   Take 1 tablet (10 mg total) by mouth daily.      simvastatin 20 MG tablet   Commonly known as: ZOCOR   Take 20 mg by mouth every evening.               Consults:  Orthopedic Surgery- Dr. Shon Baton   SIGNIFICANT DIAGNOSTIC STUDIES:  Mr Toes Left W/o Cm  10/26/2011  *RADIOLOGY REPORT*  Clinical Data: Nonhealing ulcer on the left second toe.  MRI OF THE LEFT TOES WITHOUT CONTRAST  Technique:  Multiplanar, multisequence MR imaging was performed. No intravenous contrast was administered.  Comparison: None.  Findings: There is intense marrow edema about the PIP joint of the second toe.  Edema extends into the proximal phalanx for 1.5 cm. Ulceration on the second toe is noted.  No fluid collection to suggest abscess is identified. Soft tissue edema about the second toe is identified.  There is also edema about the dorsum of the foot.  Intrinsic musculature is atrophied.  No tendon tear is identified.  No mass.  No other evidence of infectious or inflammatory process is identified.  The patient has some first MTP degenerative disease.  IMPRESSION: Findings consistent  with osteomyelitis about the PIP joint of the second toe as described above.  Negative for abscess.   Original Report Authenticated By: Bernadene Bell. D'ALESSIO, M.D.          BRIEF ADMITTING H & P: 78 year-old female with history of CAD status post stenting, hypertension, hyperlipidemia and diabetes mellitus type 2 on insulin started taking some pain and redness of the left foot on the distal aspect 2 weeks ago. Her PCP started on oral antibiotics and lanced lesion on the left second toe. This was nonhealing. For which an MRI was done which showed osteomyelitis and patient was instructed to come to the ER. In the ER patient was in addition of hyperkalemia with acute renal failure. Patient was given Kayexalate and calcium gluconate IV insulin and D50. Patient has been admitted for further management. Patient denies any fever chills. Denies any pain at the site of the osteomyelitis at this time. Initially 2 weeks ago she had erythema of that area which has resolved since started taking  antibiotics. Patient denies any nausea vomiting or diarrhea. Denies any chest pain or shortness of breath.    Hospital Course:  Present on Admission:  .Osteomyelitis of toe: Pt was partially treated with bactrim and had no cultures for guidance. She had no abscess but rather a chronic ulcer that progressed to osteomyelitis. In light of this I suspect anaerobes and usual gram positive pathogens. I discussed with Dr. Orvan Falconer from Infectious Disease and he recommends Avelox orally for 6 weeks with re-evaluation serially to assess progress. PT will follow up with Dr. Victorino Dike from Orthopedic surgery on Monday 10/31/2011.  Marland KitchenARF (acute renal failure): I suspect that patient went into ARF due to the cumulative effect of bactrim in the setting of ACE-I and ARB. The patient's renal function is now back to baseline. I will resume her ARB and hold her ACE-I for now. I recommend re-evaluating renal function in 1 week and at that time reconsider resuming ACE-I.  Marland KitchenHyperkalemia: Secondary to renal failure. Now resolved.  .DM (diabetes mellitus), type 2: Hb A1c of 8.2 indicates poor control. Blood sugars have been adequately but not optimally controlled during this hospitalization. Will defer to PMD for further management.  Disposition and Follow-up: F/U with PMD in 1 week and with Dr. Victorino Dike on Monday 8/26.2013. Discharge Orders    Future Appointments: Provider: Department: Dept Phone: Center:   10/31/2011 6:45 AM [facility] 3 [facility] [phone] [facility]   11/02/2011 6:45 AM [facility] 3 [facility] [phone] [facility]   11/04/2011 6:45 AM [facility] 3 [facility] [phone] [facility]   11/07/2011 6:45 AM [facility] 3 [facility] [phone] [facility]   11/09/2011 6:45 AM [facility] 3 [facility] [phone] [facility]   11/11/2011 6:45 AM [facility] 3 [facility] [phone] [facility]   11/14/2011 6:45 AM [facility]  3 [facility] [phone] [facility]   11/16/2011 6:45 AM [facility] 3 [facility] [phone] [facility]   11/18/2011 6:45 AM [facility] 3 [facility] [phone] [facility]   11/21/2011 6:45 AM [facility] Monitor 3 [facility] [phone] [facility]   11/23/2011 6:45 AM [facility] Monitor 3 [facility] [phone] [facility]   11/25/2011 6:45 AM [facility] Monitor 3 [facility] [phone] [facility]   11/28/2011 6:45 AM [facility] Monitor 3 [facility] [phone] [facility]   11/30/2011 6:45 AM [facility] Monitor 3 [facility] [phone] [facility]     Future Orders Please Complete By  Expires   Diet Carb Modified      Scheduling Instructions:   Low sodium heart healthy   Activity as tolerated - No restrictions      Scheduling Instructions:   Wear shoe as prescribed by orthopedic surgery      DISCHARGE EXAM:  General: Alert, awake, oriented x3, in no acute distress.  Vital Signs:Blood pressure 131/56, pulse 82, temperature 97.5 F (36.4 C), temperature source Oral, resp. rate 18, height 5\' 7"  (1.702 m), weight 87.408 kg (192 lb 11.2 oz), SpO2 96.00%. HEENT: [place]/AT PEERL, EOMI  Heart: Regular rate and rhythm, without murmurs, rubs, gallops, PMI non-displaced, no heaves or thrills on palpation.  Lungs: Clear to auscultation, no wheezing or rhonchi noted. No increased vocal fremitus resonant to percussion  Abdomen: Soft, nontender, nondistended, positive bowel sounds, no masses no hepatosplenomegaly noted..  Neuro: No focal neurological deficits noted cranial nerves II through XII grossly intact. DTRs 2+ bilaterally upper and lower extremities. Strength functional in bilateral upper and lower extremities.  Musculoskeletal: No warm swelling or erythema around joints, no spinal tenderness noted. Open ulcer on the medial aspect of the second toe    Basename 10/28/11 0625 10/27/11 0300  NA 137 141  K PENDING 5.4*  CL 105 111   CO2 19 22  GLUCOSE 223* 128*  BUN 22 38*  CREATININE 1.09 1.53*  CALCIUM 9.5 9.3  MG -- --  PHOS -- --   No results found for this basename: AST:2,ALT:2,ALKPHOS:2,BILITOT:2,PROT:2,ALBUMIN:2 in the last 72 hours No results found for this basename: LIPASE:2,AMYLASE:2 in the last 72 hours  Basename 10/28/11 0625 10/27/11 0300 10/26/11 1813  WBC 4.1 3.6* --  NEUTROABS 2.0 -- 1.7  HGB 11.5* 10.6* --  HCT 33.8* 32.2* --  MCV 90.1 91.2 --  PLT 215 188 --   Total time for discharge process including face-to-face time greater than 30 minutes Signed: MATTHEWS,MICHELLE A. 10/28/2011, 9:48 AM

## 2011-10-28 NOTE — Progress Notes (Signed)
Patient evaluated for community based chronic disease management services with [facility] Care Management Program as a benefit of patient's [employer]. Spoke with patient at bedside to explain [facility] Care Management services. Left contact information at bedside. Patient has declined services at this time.  Made inpatient Case Manager aware that [facility] Care Management consultation is complete.  Of note, [facility] Care Management services does not replace or interfere with any services that are arranged by inpatient case management or social work.  For additional questions or referrals please contact Anibal Henderson BSN RN [facility] Liaison at [phone].

## 2011-10-28 NOTE — Progress Notes (Signed)
Iv d/c'd.  Tele d/c'd.  Pt d/c'd to home.  Home meds and d/c instructions have been discussed with pt.  Pt denies any questions or concerns at this time.   Nino Glow RN

## 2011-10-28 NOTE — Progress Notes (Signed)
Ok for patient d/c today per medicine Spoke with Dr Victorino Dike - patient can f/u Monday with him as an outpatient to discuss treatment options. Office no: [phone]

## 2011-10-31 ENCOUNTER — Encounter (HOSPITAL_COMMUNITY): Payer: Medicare HMO

## 2011-11-02 ENCOUNTER — Encounter (HOSPITAL_COMMUNITY): Payer: Medicare HMO

## 2011-11-02 NOTE — ED Provider Notes (Signed)
Medical screening examination/treatment/procedure(s) were conducted as a shared visit with non-physician practitioner(s) and myself.  I personally evaluated the patient during the encounter Pt with osteo toe on recent mri. Toe infected w mild surround cellulitis. Labs. Admit.   Suzi Roots, MD 11/02/11 [phone]

## 2011-11-04 ENCOUNTER — Encounter (HOSPITAL_COMMUNITY): Payer: Medicare HMO

## 2011-11-07 ENCOUNTER — Encounter (HOSPITAL_COMMUNITY): Payer: Medicare HMO

## 2011-11-09 ENCOUNTER — Encounter (HOSPITAL_COMMUNITY): Payer: Medicare HMO

## 2011-11-11 ENCOUNTER — Encounter (HOSPITAL_COMMUNITY): Payer: Medicare HMO

## 2011-11-14 ENCOUNTER — Encounter (HOSPITAL_COMMUNITY): Payer: Medicare HMO

## 2011-11-16 ENCOUNTER — Encounter (HOSPITAL_COMMUNITY): Payer: Medicare HMO

## 2011-11-17 ENCOUNTER — Encounter: Payer: Self-pay | Admitting: Internal Medicine

## 2011-11-18 ENCOUNTER — Encounter (HOSPITAL_COMMUNITY): Payer: Medicare HMO

## 2011-11-21 ENCOUNTER — Encounter (HOSPITAL_COMMUNITY)
Admission: RE | Admit: 2011-11-21 | Discharge: 2011-11-21 | Disposition: A | Discharge status: Home / Self Care | Payer: Medicare HMO | Source: Ambulatory Visit | Attending: Cardiology | Admitting: Cardiology

## 2011-11-21 DIAGNOSIS — E119 Type 2 diabetes mellitus without complications: Secondary | ICD-10-CM | POA: Insufficient documentation

## 2011-11-21 DIAGNOSIS — I10 Essential (primary) hypertension: Secondary | ICD-10-CM | POA: Insufficient documentation

## 2011-11-21 DIAGNOSIS — K219 Gastro-esophageal reflux disease without esophagitis: Secondary | ICD-10-CM | POA: Insufficient documentation

## 2011-11-21 DIAGNOSIS — I2510 Atherosclerotic heart disease of native coronary artery without angina pectoris: Secondary | ICD-10-CM | POA: Insufficient documentation

## 2011-11-21 DIAGNOSIS — Z9861 Coronary angioplasty status: Secondary | ICD-10-CM | POA: Insufficient documentation

## 2011-11-21 DIAGNOSIS — Z5189 Encounter for other specified aftercare: Secondary | ICD-10-CM | POA: Insufficient documentation

## 2011-11-23 ENCOUNTER — Encounter (HOSPITAL_COMMUNITY)
Admission: RE | Admit: 2011-11-23 | Discharge: 2011-11-23 | Disposition: A | Discharge status: Home / Self Care | Payer: Medicare HMO | Source: Ambulatory Visit | Attending: Cardiology | Admitting: Cardiology

## 2011-11-25 ENCOUNTER — Encounter (HOSPITAL_COMMUNITY)
Admission: RE | Admit: 2011-11-25 | Discharge: 2011-11-25 | Disposition: A | Discharge status: Home / Self Care | Payer: Medicare HMO | Source: Ambulatory Visit | Attending: Cardiology | Admitting: Cardiology

## 2011-11-28 ENCOUNTER — Encounter (HOSPITAL_COMMUNITY)
Admission: RE | Admit: 2011-11-28 | Discharge: 2011-11-28 | Disposition: A | Discharge status: Home / Self Care | Payer: Medicare HMO | Source: Ambulatory Visit | Attending: Cardiology | Admitting: Cardiology

## 2011-11-30 ENCOUNTER — Encounter (HOSPITAL_COMMUNITY)
Admission: RE | Admit: 2011-11-30 | Discharge: 2011-11-30 | Disposition: A | Discharge status: Home / Self Care | Payer: Medicare HMO | Source: Ambulatory Visit | Attending: Cardiology | Admitting: Cardiology

## 2011-12-02 ENCOUNTER — Encounter (HOSPITAL_COMMUNITY)
Admission: RE | Admit: 2011-12-02 | Discharge: 2011-12-02 | Disposition: A | Discharge status: Home / Self Care | Payer: Medicare HMO | Source: Ambulatory Visit | Attending: Cardiology | Admitting: Cardiology

## 2011-12-05 ENCOUNTER — Encounter (HOSPITAL_COMMUNITY)
Admission: RE | Admit: 2011-12-05 | Discharge: 2011-12-05 | Disposition: A | Discharge status: Home / Self Care | Payer: Medicare HMO | Source: Ambulatory Visit | Attending: Cardiology | Admitting: Cardiology

## 2011-12-05 ENCOUNTER — Encounter: Payer: Self-pay | Admitting: Gastroenterology

## 2011-12-06 ENCOUNTER — Encounter: Payer: Self-pay | Admitting: Gastroenterology

## 2011-12-06 ENCOUNTER — Ambulatory Visit (INDEPENDENT_AMBULATORY_CARE_PROVIDER_SITE_OTHER): Payer: Medicare HMO | Admitting: Gastroenterology

## 2011-12-06 VITALS — BP 134/60 | HR 84 | Temp 98.4°F | Ht 67.0 in | Wt 192.2 lb

## 2011-12-06 DIAGNOSIS — K219 Gastro-esophageal reflux disease without esophagitis: Secondary | ICD-10-CM

## 2011-12-06 DIAGNOSIS — Z86010 Personal history of colonic polyps: Secondary | ICD-10-CM

## 2011-12-06 MED ORDER — OMEPRAZOLE 20 MG PO CPDR: 20.0000 mg | DELAYED_RELEASE_CAPSULE | Freq: Two times a day (BID) | ORAL | Status: DC

## 2011-12-06 NOTE — Progress Notes (Signed)
Referring Provider: Donita Brooks, MD Primary Care Physician:  Leo Grosser, MD Primary GI: Dr. Jena Gauss   Chief Complaint  Patient presents with  . Follow-up    HPI:   78 year old female with hx of PUD documented by EGD Aug 2011 secondary to NSAIDs and hx of adenomatous polyps with need for surveillance 2016. Returns today for routine follow-up. Last year negative ifobt, ordered due to questionable melena. Hx of anemia, with hgb at that time noting stable normocytic anemia. Anemia panel with normal ferritin, iron. Ferritin was 136, iron 85. In interim from last visit, had cardiac cath with 2 stents. Had infected toe recently, osteomyelitis, 2nd left toe amputated.  States last week started having reflux issues. Was taking Prilosec BID. States son bought her OTC brand due to financial issues, pt thinks this may have caused the reflux exacerbation. No dysphagia. On Effient now. No evidence of GI bleeding. No changes in bowel habits. Had short course of N/V/D that was likely a "bug" she thinks.   Past Medical History  Diagnosis Date  . GERD (gastroesophageal reflux disease)   . PUD (peptic ulcer disease)   . Anemia   . Hypercholesterolemia   . Hypertension   . S/P endoscopy Aug 2011    3 superficial gastric ulcers, NSAID-induced  . S/P colonoscopy Sept 2011    left-sided diverticula, tubular adenoma  . Coronary artery disease   . Shortness of breath   . Diabetes mellitus     insulin dependent  . Headache     rare migraines  . Cancer     hx of skin cancer  . Arthritis     Past Surgical History  Procedure Date  . Tonsillectomy   . Appendectomy   . Eye surgery     cataracts  . Cardiac stents 07/19/2011  . Cardiac catheterization 07/19/2011  . Esophagogastroduodenoscopy 10/16/09    normal without barrett's/three superficial gastric ulcers  . Colonoscopy 12/04/09    normal rectum/left-sided diverticula    Current Outpatient Prescriptions  Medication Sig Dispense Refill    . acetaminophen (TYLENOL) 500 MG tablet Take 1,000 mg by mouth 2 (two) times daily. For pain      . aspirin 81 MG chewable tablet Chew 1 tablet (81 mg total) by mouth daily.      . insulin glargine (LANTUS SOLOSTAR) 100 UNIT/ML injection Inject 20 Units into the skin at bedtime.      . insulin NPH (HUMULIN N,NOVOLIN N) 100 UNIT/ML injection Inject 7 Units into the skin 2 (two) times daily before a meal.      . carvedilol (COREG) 6.25 MG tablet Take 6.25 mg by mouth 2 (two) times daily.      . cyclobenzaprine (FLEXERIL) 10 MG tablet Take 10 mg by mouth every 8 (eight) hours as needed. For muscle spasms      . losartan (COZAAR) 50 MG tablet Take 50 mg by mouth daily.      Marland Kitchen moxifloxacin (AVELOX) 400 MG tablet Take 1 tablet (400 mg total) by mouth daily.  40 tablet  0  . Omega-3 Fatty Acids (FISH OIL) 1200 MG CAPS Take 1 capsule by mouth daily.       Marland Kitchen omeprazole (PRILOSEC) 20 MG capsule Take 20 mg by mouth 2 (two) times daily.       . prasugrel (EFFIENT) 10 MG TABS Take 1 tablet (10 mg total) by mouth daily.  30 tablet  0  . simvastatin (ZOCOR) 20 MG tablet Take 20 mg by mouth  every evening.        Allergies as of 12/06/2011 - Review Complete 12/06/2011  Allergen Reaction Noted  . Aspirin Other (See Comments) 10/06/2010    No family history on file.  History   Social History  . Marital Status: Widowed    Spouse Name: N/A    Number of Children: N/A  . Years of Education: N/A   Social History Main Topics  . Smoking status: Never Smoker   . Smokeless tobacco: Never Used  . Alcohol Use: No  . Drug Use: No  . Sexually Active: No   Other Topics Concern  . None   Social History Narrative  . None    Review of Systems: Gen: Denies fever, chills, anorexia. Denies fatigue, weakness, weight loss.  CV: Denies chest pain, palpitations, syncope, peripheral edema, and claudication. Resp: Denies dyspnea at rest, cough, wheezing, coughing up blood, and pleurisy. GI: Denies vomiting  blood, jaundice, and fecal incontinence.   Denies dysphagia or odynophagia. Derm: Denies rash, itching, dry skin Psych: Denies depression, anxiety, memory loss, confusion. No homicidal or suicidal ideation.  Heme: Denies bruising, bleeding, and enlarged lymph nodes.  Physical Exam: BP 134/60  Pulse 84  Temp 98.4 F (36.9 C) (Temporal)  Ht 5\' 7"  (1.702 m)  Wt 192 lb 3.2 oz (87.181 kg)  BMI 30.10 kg/m2 General:   Alert and oriented. No distress noted. Pleasant and cooperative.  Head:  Normocephalic and atraumatic. Eyes:  Conjuctiva clear without scleral icterus. Mouth:  Oral mucosa pink and moist. Good dentition. No lesions. Heart:  S1, S2 present without murmurs, rubs, or gallops.  Abdomen:  +BS, soft, non-tender and non-distended. No rebound or guarding. No HSM or masses noted. Msk:  Symmetrical without gross deformities. Normal posture. Extremities:  Left lower extremity with 2+ edema, known finding per pt  Neurologic:  Alert and  oriented x4;  grossly normal neurologically. Skin:  Intact without significant lesions or rashes. Psych:  Alert and cooperative. Normal mood and affect.

## 2011-12-06 NOTE — Assessment & Plan Note (Signed)
Surveillance 2016. No lower GI symptoms currently. Return in 1 year.

## 2011-12-06 NOTE — Progress Notes (Signed)
Faxed to PCP

## 2011-12-06 NOTE — Patient Instructions (Addendum)
Continue to take Prilosec twice a day. Please call us if reflux symptoms do not improve.  Colonoscopy planned for 2016.  We will see you back in 1 year.

## 2011-12-06 NOTE — Assessment & Plan Note (Signed)
Recent exacerbation of symptoms with switch to generic, OTC. Rx sent to pharmacy for BID. Pt to contact us if no improvement in symptoms. No melena or abdominal pain, no dysphagia. Otherwise, see in 1 year.

## 2011-12-07 ENCOUNTER — Encounter (HOSPITAL_COMMUNITY)
Admission: RE | Admit: 2011-12-07 | Discharge: 2011-12-07 | Disposition: A | Discharge status: Home / Self Care | Payer: Medicare HMO | Source: Ambulatory Visit | Attending: Cardiology | Admitting: Cardiology

## 2011-12-09 ENCOUNTER — Encounter (HOSPITAL_COMMUNITY)
Admission: RE | Admit: 2011-12-09 | Discharge: 2011-12-09 | Disposition: A | Discharge status: Home / Self Care | Payer: Medicare HMO | Source: Ambulatory Visit | Attending: Cardiology | Admitting: Cardiology

## 2011-12-12 ENCOUNTER — Encounter (HOSPITAL_COMMUNITY): Payer: Medicare HMO

## 2011-12-14 ENCOUNTER — Encounter (HOSPITAL_COMMUNITY)
Admission: RE | Admit: 2011-12-14 | Discharge: 2011-12-14 | Disposition: A | Discharge status: Home / Self Care | Payer: Medicare HMO | Source: Ambulatory Visit | Attending: Cardiology | Admitting: Cardiology

## 2011-12-16 ENCOUNTER — Encounter (HOSPITAL_COMMUNITY)
Admission: RE | Admit: 2011-12-16 | Discharge: 2011-12-16 | Disposition: A | Discharge status: Home / Self Care | Payer: Medicare HMO | Source: Ambulatory Visit | Attending: Cardiology | Admitting: Cardiology

## 2011-12-19 ENCOUNTER — Encounter (HOSPITAL_COMMUNITY)
Admission: RE | Admit: 2011-12-19 | Discharge: 2011-12-19 | Disposition: A | Discharge status: Home / Self Care | Payer: Medicare HMO | Source: Ambulatory Visit | Attending: Cardiology | Admitting: Cardiology

## 2011-12-21 ENCOUNTER — Inpatient Hospital Stay (HOSPITAL_COMMUNITY)
Admission: AD | Admit: 2011-12-21 | Discharge: 2011-12-22 | DRG: 251 | Disposition: A | Discharge status: Home / Self Care | Payer: Medicare HMO | Source: Ambulatory Visit | Attending: Cardiology | Admitting: Cardiology

## 2011-12-21 ENCOUNTER — Encounter (HOSPITAL_COMMUNITY): Payer: Medicare HMO

## 2011-12-21 ENCOUNTER — Encounter (HOSPITAL_COMMUNITY)
Admission: AD | Disposition: A | Discharge status: Home / Self Care | Payer: Self-pay | Source: Ambulatory Visit | Attending: Cardiology

## 2011-12-21 DIAGNOSIS — I2510 Atherosclerotic heart disease of native coronary artery without angina pectoris: Secondary | ICD-10-CM | POA: Diagnosis present

## 2011-12-21 DIAGNOSIS — E7800 Pure hypercholesterolemia, unspecified: Secondary | ICD-10-CM | POA: Diagnosis present

## 2011-12-21 DIAGNOSIS — E785 Hyperlipidemia, unspecified: Secondary | ICD-10-CM | POA: Diagnosis present

## 2011-12-21 DIAGNOSIS — I200 Unstable angina: Secondary | ICD-10-CM | POA: Diagnosis present

## 2011-12-21 DIAGNOSIS — Z9861 Coronary angioplasty status: Secondary | ICD-10-CM

## 2011-12-21 DIAGNOSIS — Z85828 Personal history of other malignant neoplasm of skin: Secondary | ICD-10-CM

## 2011-12-21 DIAGNOSIS — I10 Essential (primary) hypertension: Secondary | ICD-10-CM | POA: Diagnosis present

## 2011-12-21 DIAGNOSIS — E119 Type 2 diabetes mellitus without complications: Secondary | ICD-10-CM | POA: Diagnosis present

## 2011-12-21 DIAGNOSIS — S98139A Complete traumatic amputation of one unspecified lesser toe, initial encounter: Secondary | ICD-10-CM

## 2011-12-21 DIAGNOSIS — T82897A Other specified complication of cardiac prosthetic devices, implants and grafts, initial encounter: Principal | ICD-10-CM | POA: Diagnosis present

## 2011-12-21 DIAGNOSIS — Z794 Long term (current) use of insulin: Secondary | ICD-10-CM

## 2011-12-21 DIAGNOSIS — Y849 Medical procedure, unspecified as the cause of abnormal reaction of the patient, or of later complication, without mention of misadventure at the time of the procedure: Secondary | ICD-10-CM | POA: Diagnosis present

## 2011-12-21 HISTORY — PX: LEFT HEART CATHETERIZATION WITH CORONARY ANGIOGRAM: SHX5451

## 2011-12-21 LAB — CBC
HCT: 32 % — ABNORMAL LOW (ref 36.0–46.0)
Hemoglobin: 10.3 g/dL — ABNORMAL LOW (ref 12.0–15.0)
MCH: 29.5 pg (ref 26.0–34.0)
MCV: 91.7 fL (ref 78.0–100.0)
Platelets: 305 10*3/uL (ref 150–400)
RBC: 3.49 MIL/uL — ABNORMAL LOW (ref 3.87–5.11)
WBC: 5.9 10*3/uL (ref 4.0–10.5)

## 2011-12-21 LAB — BASIC METABOLIC PANEL
CO2: 24 mEq/L (ref 19–32)
Chloride: 106 mEq/L (ref 96–112)
Creatinine, Ser: 1.35 mg/dL — ABNORMAL HIGH (ref 0.50–1.10)
Glucose, Bld: 113 mg/dL — ABNORMAL HIGH (ref 70–99)

## 2011-12-21 LAB — GLUCOSE, CAPILLARY: Glucose-Capillary: 48 mg/dL — ABNORMAL LOW (ref 70–99)

## 2011-12-21 LAB — POCT ACTIVATED CLOTTING TIME
Activated Clotting Time: 124 seconds
Activated Clotting Time: 339 seconds

## 2011-12-21 LAB — CK TOTAL AND CKMB (NOT AT ARMC): Total CK: 110 U/L (ref 7–177)

## 2011-12-21 SURGERY — LEFT HEART CATHETERIZATION WITH CORONARY ANGIOGRAM
Anesthesia: LOCAL

## 2011-12-21 MED ORDER — BIVALIRUDIN 250 MG IV SOLR
INTRAVENOUS | Status: AC
  Filled 2011-12-21: qty 250

## 2011-12-21 MED ORDER — ACETAMINOPHEN 325 MG PO TABS: 650.0000 mg | ORAL_TABLET | ORAL | Status: DC | PRN

## 2011-12-21 MED ORDER — ASPIRIN 300 MG RE SUPP
300.0000 mg | RECTAL | Status: DC
  Filled 2011-12-21: qty 1

## 2011-12-21 MED ORDER — PROMETHAZINE HCL 25 MG PO TABS: 25.0000 mg | ORAL_TABLET | Freq: Four times a day (QID) | ORAL | Status: DC | PRN

## 2011-12-21 MED ORDER — NITROGLYCERIN 0.2 MG/ML ON CALL CATH LAB
INTRAVENOUS | Status: AC
  Filled 2011-12-21: qty 1

## 2011-12-21 MED ORDER — INSULIN NPH (HUMAN) (ISOPHANE) 100 UNIT/ML ~~LOC~~ SUSP
7.0000 [IU] | Freq: Two times a day (BID) | SUBCUTANEOUS | Status: DC
  Filled 2011-12-21: qty 10

## 2011-12-21 MED ORDER — SIMVASTATIN 20 MG PO TABS
20.0000 mg | ORAL_TABLET | Freq: Every evening | ORAL | Status: DC
  Filled 2011-12-21: qty 1

## 2011-12-21 MED ORDER — NITROGLYCERIN 0.4 MG SL SUBL: 0.4000 mg | SUBLINGUAL_TABLET | SUBLINGUAL | Status: DC | PRN

## 2011-12-21 MED ORDER — ASPIRIN 81 MG PO CHEW: 324.0000 mg | CHEWABLE_TABLET | ORAL | Status: DC

## 2011-12-21 MED ORDER — SODIUM CHLORIDE 0.9 % IJ SOLN: 3.0000 mL | INTRAMUSCULAR | Status: DC | PRN

## 2011-12-21 MED ORDER — ASPIRIN 81 MG PO CHEW
324.0000 mg | CHEWABLE_TABLET | ORAL | Status: AC
  Administered 2011-12-21: 324 mg via ORAL
  Filled 2011-12-21: qty 4

## 2011-12-21 MED ORDER — ASPIRIN EC 81 MG PO TBEC
81.0000 mg | DELAYED_RELEASE_TABLET | Freq: Every day | ORAL | Status: DC
  Administered 2011-12-21: 22:00:00 81 mg via ORAL
  Filled 2011-12-21 (×2): qty 1

## 2011-12-21 MED ORDER — ASPIRIN 81 MG PO CHEW: 81.0000 mg | CHEWABLE_TABLET | Freq: Every day | ORAL | Status: DC

## 2011-12-21 MED ORDER — CILOSTAZOL 50 MG PO TABS
50.0000 mg | ORAL_TABLET | Freq: Two times a day (BID) | ORAL | Status: DC
  Administered 2011-12-21 – 2011-12-22 (×2): 50 mg via ORAL
  Filled 2011-12-21 (×3): qty 1

## 2011-12-21 MED ORDER — FENTANYL CITRATE 0.05 MG/ML IJ SOLN
INTRAMUSCULAR | Status: AC
  Filled 2011-12-21: qty 2

## 2011-12-21 MED ORDER — CARVEDILOL 6.25 MG PO TABS
6.2500 mg | ORAL_TABLET | Freq: Every day | ORAL | Status: DC
  Filled 2011-12-21: qty 1

## 2011-12-21 MED ORDER — INSULIN GLARGINE 100 UNIT/ML ~~LOC~~ SOLN: 20.0000 [IU] | Freq: Every day | SUBCUTANEOUS | Status: DC

## 2011-12-21 MED ORDER — SODIUM CHLORIDE 0.9 % IJ SOLN: 3.0000 mL | Freq: Two times a day (BID) | INTRAMUSCULAR | Status: DC

## 2011-12-21 MED ORDER — SODIUM CHLORIDE 0.9 % IV SOLN: INTRAVENOUS | Status: DC

## 2011-12-21 MED ORDER — PANTOPRAZOLE SODIUM 40 MG PO TBEC: 40.0000 mg | DELAYED_RELEASE_TABLET | Freq: Every day | ORAL | Status: DC

## 2011-12-21 MED ORDER — ONDANSETRON HCL 4 MG/2ML IJ SOLN: 4.0000 mg | Freq: Four times a day (QID) | INTRAMUSCULAR | Status: DC | PRN

## 2011-12-21 MED ORDER — SODIUM CHLORIDE 0.9 % IV SOLN: 250.0000 mL | INTRAVENOUS | Status: DC | PRN

## 2011-12-21 MED ORDER — MIDAZOLAM HCL 2 MG/2ML IJ SOLN
INTRAMUSCULAR | Status: AC
  Filled 2011-12-21: qty 2

## 2011-12-21 MED ORDER — BIVALIRUDIN 250 MG IV SOLR
INTRAVENOUS | Status: AC
  Filled 2011-12-21: qty 500

## 2011-12-21 MED ORDER — PRASUGREL HCL 10 MG PO TABS
10.0000 mg | ORAL_TABLET | Freq: Every day | ORAL | Status: DC
  Administered 2011-12-22: 11:00:00 10 mg via ORAL
  Filled 2011-12-21 (×2): qty 1

## 2011-12-21 MED ORDER — PRASUGREL HCL 10 MG PO TABS
10.0000 mg | ORAL_TABLET | Freq: Every day | ORAL | Status: DC
  Filled 2011-12-21: qty 1

## 2011-12-21 MED ORDER — SODIUM CHLORIDE 0.9 % IV SOLN: INTRAVENOUS | Status: AC

## 2011-12-21 MED ORDER — HEPARIN SODIUM (PORCINE) 1000 UNIT/ML IJ SOLN
INTRAMUSCULAR | Status: AC
  Filled 2011-12-21: qty 1

## 2011-12-21 MED ORDER — VERAPAMIL HCL 2.5 MG/ML IV SOLN
INTRAVENOUS | Status: AC
  Filled 2011-12-21: qty 2

## 2011-12-21 MED ORDER — ASPIRIN 81 MG PO CHEW
81.0000 mg | CHEWABLE_TABLET | Freq: Every day | ORAL | Status: DC
  Administered 2011-12-22: 11:00:00 81 mg via ORAL
  Filled 2011-12-21: qty 1

## 2011-12-21 MED ORDER — LOSARTAN POTASSIUM 50 MG PO TABS
50.0000 mg | ORAL_TABLET | Freq: Every day | ORAL | Status: DC
  Filled 2011-12-21: qty 1

## 2011-12-21 MED ORDER — ACETAMINOPHEN 500 MG PO TABS
1000.0000 mg | ORAL_TABLET | Freq: Four times a day (QID) | ORAL | Status: DC | PRN
  Filled 2011-12-21: qty 2

## 2011-12-21 MED ORDER — ACETAMINOPHEN 500 MG PO TABS
1000.0000 mg | ORAL_TABLET | Freq: Two times a day (BID) | ORAL | Status: DC
  Filled 2011-12-21: qty 2

## 2011-12-21 MED ORDER — LIDOCAINE HCL (PF) 1 % IJ SOLN
INTRAMUSCULAR | Status: AC
  Filled 2011-12-21: qty 30

## 2011-12-21 MED ORDER — ONDANSETRON HCL 4 MG/2ML IJ SOLN
INTRAMUSCULAR | Status: AC
  Filled 2011-12-21: qty 2

## 2011-12-21 MED ORDER — HEPARIN (PORCINE) IN NACL 2-0.9 UNIT/ML-% IJ SOLN
INTRAMUSCULAR | Status: AC
  Filled 2011-12-21: qty 1000

## 2011-12-21 MED ORDER — ASPIRIN EC 81 MG PO TBEC: 81.0000 mg | DELAYED_RELEASE_TABLET | Freq: Every day | ORAL | Status: DC

## 2011-12-21 NOTE — Progress Notes (Signed)
TR BAND REMOVAL  LOCATION:    right radial  DEFLATED PER PROTOCOL:    yes  TIME BAND OFF / DRESSING APPLIED:    21:30   SITE UPON ARRIVAL:    Level 0  SITE AFTER BAND REMOVAL:    Level 0  REVERSE ALLEN'S TEST:     positive  CIRCULATION SENSATION AND MOVEMENT:    Within Normal Limits   yes  COMMENTS:    

## 2011-12-21 NOTE — Progress Notes (Signed)
Site area: right groin  Site Prior to Removal:  Level 0  Pressure Applied For 1 MINUTES    Minutes Beginning at 0  Manual:   yes  Patient Status During Pull:  stable  Post Pull Groin Site:  Level 0  Post Pull Instructions Given:  yes  Post Pull Pulses Present:  yes  Dressing Applied:  yes  Comments:  Veinous sheath

## 2011-12-21 NOTE — CV Procedure (Signed)
Procedure performed:  Left heart catheterization including hemodynamic monitoring of the left ventricle, LV gram. Selective right and left coronary arteriography. PTCA and scoring balloon PTCA  of the Ramus intermediate and circumflex in-stent restenosis.  2.5 x 10 mm angiosculpt balloon was used.  Indication: Patient is a 78 year-old womale with history of hypertension,  hyperlipidemia,  Diabetes Mellitus and history of drug-eluting stent implantation to the same branches of ramus intermediate and circumflex coronary artery on 07/19/2011. She now returns with worsening shortness of breath for the last 2 days to 3 days associated with marked EKG changes which are new. Unstable anginal equivalent was suspected.  Hence is brought to the cardiac catheterization lab to evaluate  coronary anatomy for definitive diagnosis of CAD.  Hemodynamic data: Left ventricular pressure was 129/5 with LVEDP of 14 mm mercury. Aortic pressure was 121/55 with a mean of 84 mm mercury. There was no pressure gradient across the aortic valve.   Left ventricle: Performed in the RAO projection revealed LVEF of 45%. There was No significant MR. Mid anterolateral wall motion abnormality in the form of hypokinesis.  Right coronary artery: It is occluded in the proximal segment. This is old.  Left main coronary artery is large and normal. There is mild proximal coronary calcification.  Circumflex coronary artery: Moderate to large vessel and has a long stent in the midsegment. Midsegment of the stent shows a diffuse 80% stenosis. Distal end of the stent shows candy wrapper stenosis of 80%. H/O Prox/Mid Circumflex 2.5x25 Promus On 07/19/11.  LAD:  LAD gives origin to a large diagonal-1.  LAD has mild luminal irregularities.   Ramus intermediate: This shows severe in-stent restenosis throughout the stented segment. ramus intermediate 2.5x26 Resolute DES stent placed on 07/19/11.  Impression: High-grade instent restenosis in both  the circumflex and ramus intermediate branch of coronary arteries.  Interventional data: Successful PTCA and scoring balloon angioplasty with a 2.5 x 10 mm angiosculpt balloon to both the ramus intermediate and circumflex branches. Excellent results were evident. There was no significant residual restenosis left.   Recommendation: I will start the patient on Pletal 50 mg by mouth twice a day in the hopes of reducing in-stent restenosis. I suspect she has proximal circumflex calcific stenosis which I was able to feel well as passing the scoring balloon, the roughness and I suspect the mild haziness that is also evident in the midsegment of the in-stent restenotic site in the circumflex coronary artery mainly stenoses in. Ramus intermediate branch also has very minimal haziness. If her symptoms of shortness of breath or not the and vomiting with recurrent, consider repeat angiography.  Technique of diagnostic cardiac catheterization:  Under sterile precautions using a 6 French right radial  arterial access, a 6 French sheath was introduced into the right radial artery. A 5 [place] Tig 4 catheter was advanced into the ascending aorta selective  right coronary artery and left coronary artery was cannulated and angiography was performed in multiple views. The catheter was pulled back Out of the body over exchange length J-wire.  Same Catheter was used to perform LV gram which was performed in LAO projection. Catheter exchanged out of the body over J-Wire. NO immediate complications noted. Hemostasis achieved with TR band. Patient tolerated the procedure well.     Technique of intervention:  Using a 6 [place] XB 3.0 guide catheter theLM  coronary  was selected and cannulated. Using Angiomax for anticoagulation, I utilized a Couger guidewire and across the Ramus intermediate coronary artery  without any difficulty. I placed the tip of the wire into the distal  coronary artery. Angiography was performed.   Then  I utilized a 2.5x10 balloon Angisculpt balloon. , I performed balloon angioplasty at 16 atmospheric pressure x 40-50 seconds x6. After having performed this to the ramus intermediate branch, attention was directed towards the circumflex coronary artery. In a similar fashion I performed multiple balloon angioplasty a balloon at a peak of 16 atmospheric pressures from 35-60 seconds. At the outflow of the stent where there was candy wrapper restenosis, I used no pressure at 8 atmospheric pressure for 90 seconds. I pulled the guidewire out of the circumflex coronary artery and redirected it into the ramus intermediate branch as there was still mild residual restenosis. I performed a final inflation at 16 atmospheric pressure in the proximal and midsegment of the stent. Intracoronary nitroglycerin was administered at multiple steps throughout the angioplasty. Post angioplasty angiography revealed excellent results. The guide catheter was then pulled out of the body  over the J-wire the was no immediate complication. Patient tolerated the procedure well.  Disposition: Patient will be discharged in AM unless complications with out-patient follow up.

## 2011-12-21 NOTE — H&P (Signed)
Claudia [name] is an 78 y.o. female.   Chief Complaint: Abnormal EKG and dyspnea.  HPI:   Ms. Claudia [name] is a 78 year old Caucasian female with history of known coronary artery disease.  She had undergone cardiac catheterization and angioplasty on 07/19/2011 with implantation of 2 drug-eluting stents into the circumflex coronary artery and ramus intermediate branch.  She is chronically occluded right coronary artery.  She had been doing well until 4 days ago she started noticing worsening shortness of breath and dyspnea on exertion.  Sunday when she walked to the store she felt very winded walking to the store and had to rest.  The following day she gone to rehabilitation, while on the treadmill developed 50, short of breath, generalized weakness and had to stop the exercise and returned home and felt ill and nauseous.  Yesterday while she was at work she again suddenly felt nauseous and threw up several times and was seen by her PCP Dr. Everlena Cooper who performed an EKG and found to have abnormalities suggestive of ischemia, however patient denied any chest discomfort.  After he saw her last evening he recommended that I see her today and am seeing her this morning. She denies any chest pain.  She states that since angioplasty she has not had any nausea vomiting or shortness of breath, however in the last 3-4 days her symptoms feel like to come back.  Otherwise no other specific complaints. About a month ago she had developed osteomyelitis of the left second toe and had amputation done due to nonhealing osteomyelitis.  She states that the surgical wound has healed very well.  She has chronic leg edema and this has not changed.  She denies any PND or orthopnea.    Past Medical History  Diagnosis Date  . GERD (gastroesophageal reflux disease)   . PUD (peptic ulcer disease)   . Anemia   . Hypercholesterolemia   . Hypertension   . S/P endoscopy Aug 2011    3 superficial gastric ulcers,  NSAID-induced  . S/P colonoscopy Sept 2011    left-sided diverticula, tubular adenoma  . Coronary artery disease   . Shortness of breath   . Diabetes mellitus     insulin dependent  . Headache     rare migraines  . Cancer     hx of skin cancer  . Arthritis     Past Surgical History  Procedure Date  . Tonsillectomy   . Appendectomy   . Eye surgery     cataracts  . Cardiac stents 07/19/2011  . Cardiac catheterization 07/19/2011  . Esophagogastroduodenoscopy 10/16/09    normal without barrett's/three superficial gastric ulcers  . Colonoscopy 12/04/09    normal rectum/left-sided diverticula    No family history on file. Social History:  reports that she has never smoked. She has never used smokeless tobacco. She reports that she does not drink alcohol or use illicit drugs.  Allergies:  Allergies  Allergen Reactions  . Aspirin Other (See Comments)    Intolerant to high doses    Medications Prior to Admission  Medication Sig Dispense Refill  . acetaminophen (TYLENOL) 500 MG tablet Take 1,000 mg by mouth 2 (two) times daily. For pain      . aspirin 81 MG chewable tablet Chew 1 tablet (81 mg total) by mouth daily.      . carvedilol (COREG) 6.25 MG tablet Take 6.25 mg by mouth daily.       . cyclobenzaprine (FLEXERIL) 10 MG tablet Take  10 mg by mouth every 8 (eight) hours as needed. For muscle spasms      . insulin glargine (LANTUS SOLOSTAR) 100 UNIT/ML injection Inject 20 Units into the skin at bedtime.      . insulin NPH (HUMULIN N,NOVOLIN N) 100 UNIT/ML injection Inject 7 Units into the skin 2 (two) times daily before a meal.      . losartan (COZAAR) 50 MG tablet Take 50 mg by mouth daily.      . Omega-3 Fatty Acids (FISH OIL) 1200 MG CAPS Take 1 capsule by mouth daily.       Marland Kitchen omeprazole (PRILOSEC) 20 MG capsule Take 1 capsule (20 mg total) by mouth 2 (two) times daily.  60 capsule  11  . prasugrel (EFFIENT) 10 MG TABS Take 1 tablet (10 mg total) by mouth daily.  30 tablet  0    . simvastatin (ZOCOR) 20 MG tablet Take 20 mg by mouth every evening.        No results found for this or any previous visit (from the past 48 hour(s)). No results found.  Labs:   Lab Results  Component Value Date   WBC 4.1 10/28/2011   HGB 11.5* 10/28/2011   HCT 33.8* 10/28/2011   MCV 90.1 10/28/2011   PLT 215 10/28/2011   No results found for this basename: NA,K,CL,CO2,BUN,CREATININE,CALCIUM,LABALBU,PROT,BILITOT,ALKPHOS,ALT,AST,GLUCOSE in the last 168 hours Lab Results  Component Value Date   CKTOTAL 246* 07/28/2011   CKMB 7.3* 07/28/2011   TROPONINI <0.30 07/28/2011    Lipid Panel  No results found for this basename: chol, trig, hdl, cholhdl, vldl, ldlcalc    Review of Systems - General ROS: negative for - fever, malaise or night sweats Endocrine ROS: negative for - hair pattern changes, mood swings, palpitations or polydipsia/polyuria Respiratory ROS: positive for - shortness of breath Cardiovascular ROS: negative for - chest pain, irregular heartbeat, orthopnea or palpitations Gastrointestinal ROS: no abdominal pain, change in bowel habits, or black or bloody stools positive for - nausea/vomiting  Filed Vitals:   12/21/11 1130 12/21/11 1333  BP: 149/71 143/81  Pulse: 69 78  Temp: 97.9 F (36.6 C) 97.7 F (36.5 C)  TempSrc: Oral Oral  Resp: 18 18  Height: 5\' 7"  (1.702 m)   Weight: 86.637 kg (191 lb)   SpO2: 97% 99%     General appearance: alert, cooperative, appears stated age and no distress Eyes: conjunctivae/corneas clear. PERRL, EOM's intact. Fundi benign. Neck: no adenopathy, no carotid bruit, no JVD, supple, symmetrical, trachea midline and thyroid not enlarged, symmetric, no tenderness/mass/nodules Neck: JVP - normal, carotids 2+= without bruits Resp: clear to auscultation bilaterally Chest wall: no tenderness Cardio: irregularly irregular rhythm, S1, S2 normal and no S3 or S4 GI: soft, non-tender; bowel sounds normal; no masses,  no  organomegaly Extremities: edema 2 plus, no ulcers, gangrene or trophic changes and varicose veins noted Pulses: 2+ and symmetric Skin: Skin color, texture, turgor normal. No rashes or lesions Neurologic: Alert and oriented X 3, normal strength and tone. Normal symmetric reflexes. Normal coordination and gait   Assessment/Plan  1. Unstable angina (411.1) presenting as dyspnea and abnormal EKG EKG today in my office shows inferior and lateral ST depression suggestive of ischemia and new from prior EKG.  2. Coronary atherosclerosis of native coronary artery (414.01)  3. Postsurgical percutaneous transluminal coronary angioplasty status (V45.82)  Heart cath 07/19/11: Prox/Mid Circumflex 2.5x25 Promus, ramus intermediate 2.5x26 Resolute DES stent. Has chronic total occlusion of RCA with collaterals from the left.  4. Controlled diabetes mellitus type II without complication (250.00)  5. Chronic venous hypertension without complications (459.30) Chronic leg edema. 6.   Pure hypercholesterolemia (272.0)  Plan:   I plan on admitting the patient for hospital for urgent cardiac catheterization. I suspect her symptoms are suggestive of unstable angina pectoris. Her shortness of breath is anginal equivalent. She has not had any chest heaviness or left-sided chest pain that she had prior to angioplasty, however she has noticed marked dyspnea that is new for her and this is similar to his thumbs when she had prior to her angioplasty. Admit for the condition of the cardiac catheterization. Arrangements have been made for patient to be admitted to the hospital.  [name], MD 12/21/2011, 11:08 AM

## 2011-12-22 ENCOUNTER — Encounter (HOSPITAL_COMMUNITY): Payer: Self-pay | Admitting: *Deleted

## 2011-12-22 LAB — CBC
HCT: 30.5 % — ABNORMAL LOW (ref 36.0–46.0)
MCV: 93.6 fL (ref 78.0–100.0)
Platelets: 283 10*3/uL (ref 150–400)
RBC: 3.26 MIL/uL — ABNORMAL LOW (ref 3.87–5.11)
RDW: 12.9 % (ref 11.5–15.5)
WBC: 6.6 10*3/uL (ref 4.0–10.5)

## 2011-12-22 LAB — BASIC METABOLIC PANEL
CO2: 23 mEq/L (ref 19–32)
Chloride: 106 mEq/L (ref 96–112)
GFR calc Af Amer: 46 mL/min — ABNORMAL LOW (ref >90)
Potassium: 4.9 mEq/L (ref 3.5–5.1)

## 2011-12-22 MED ORDER — CILOSTAZOL 50 MG PO TABS: 50.0000 mg | ORAL_TABLET | Freq: Two times a day (BID) | ORAL | Status: DC

## 2011-12-22 MED FILL — Dextrose Inj 5%: INTRAVENOUS | Qty: 50 | Status: AC

## 2011-12-22 NOTE — Progress Notes (Signed)
CARDIAC REHAB PHASE I   PRE:  Rate/Rhythm: 79SR  BP:  Supine: 122/50  Sitting:   Standing:    SaO2: 99% 2.5 L  MODE:  Ambulation: 400 ft   POST:  Rate/Rhythem: 102 ST  BP:  Supine:   Sitting: 121/68  Standing:    SaO2: 94%RA [id number] Pt walked 400 ft with hand held asst. A little wobbly at times. Denied CP or SOB. Tolerated well. To sitting on side of bed after walk. Brief ed done as I saw her last admission. Pt attending CRP 2 in [place].  Duanne Limerick

## 2011-12-22 NOTE — Care Management Note (Signed)
    Page 1 of 1   12/22/2011     1:35:28 PM   CARE MANAGEMENT NOTE 12/22/2011  Patient:  Claudia [name], Claudia [name]   Account Number:  [id number]  Date Initiated:  12/22/2011  Documentation initiated by:  CRAFT,TERRI  Subjective/Objective Assessment:   78 yo female admitted 12/21/11 with SOB, CP, abnormal EKG     Action/Plan:   D/C when medically stable   Anticipated DC Date:  12/22/2011   Anticipated DC Plan:  HOME/SELF CARE      DC Planning Services  CM consult  Medication Assistance              Status of service:  Completed, signed off  Discharge Disposition:  HOME/SELF CARE  Per UR Regulation:  Reviewed for med. necessity/level of care/duration of stay  Comments:  12/22/11, Kathi Der RNC-MNN, BSN, [phone], CM received referral.  Pt is not eligible for indigent funds as has private insurance.  Pt has medication card.

## 2011-12-22 NOTE — Discharge Summary (Signed)
Physician Discharge Summary  Patient ID: Claudia [name] MRN: [id number] DOB/AGE: November 21, 1931 78 y.o.  Admit date: 12/21/2011 Discharge date: 12/22/2011  Primary Discharge Diagnosis Unstable angina with dyspnea and abnormal EKG S/P PTCA and scoring balloon PTCA of the Ramus intermediate and circumflex in-stent restenosis on 12/21/2011. 2.5 x 10 mm angiosculpt balloon. H/O Prox/Mid Circumflex 2.5x25 Promus , ramus intermediate 2.5x26 Resolute DES stent placed on 07/19/11.  Secondary Discharge Diagnosis Hypertension Hyperlipidemia Diabetes mellitus type 2 controlled  Significant Diagnostic Studies: Cardiac catheterization and angioplasty as dictated above on 12/21/2011  Consults:   Hospital Course: Patient admitted directly from the hospital when she presented with the symptoms of worsening shortness of breath dyspnea, nausea. History was suggestive of unstable anginal equivalent in a patient who is diabetic. She also had markedly abnormal EKG in the form of ST segment depression in inferior and lateral leads. She was taken to the catheterization lab the same day of admission, underwent scoring balloon angioplasty. Patient remained asymptomatic the following day. Felt stable for discharge. She'll continue cardiac rehabilitation, advised her to be off of work for 10 days, she'll continue her previous medications and addition of Pletal 50 mg by mouth twice a day  in the hopes of reducing in-stent restenosis. My suspicion is she probably will have restenosis again and may need repeat angioplasty probably stent implantation.   Discharge Exam: Blood pressure 121/68, pulse 78, temperature 97.7 F (36.5 C), temperature source Oral, resp. rate 15, height 5\' 7"  (1.702 m), weight 87.3 kg (192 lb 7.4 oz), SpO2 100.00%.    General appearance: alert, cooperative and appears stated age Resp: clear to auscultation bilaterally Chest wall: no tenderness GI: soft, non-tender; bowel sounds normal; no  masses,  no organomegaly Extremities: extremities normal, atraumatic, no cyanosis or edema right radial arterial access site stable with good pulse Cardiac exam: S1 and S2 is normal, frequent ectopy heard, no murmur Labs:   Lab Results  Component Value Date   WBC 6.6 12/22/2011   HGB 10.0* 12/22/2011   HCT 30.5* 12/22/2011   MCV 93.6 12/22/2011   PLT 283 12/22/2011    Lab 12/22/11 0600  NA 138  K 4.9  CL 106  CO2 23  BUN 25*  CREATININE 1.26*  CALCIUM 8.6  PROT --  BILITOT --  ALKPHOS --  ALT --  AST --  GLUCOSE 148*   Lab Results  Component Value Date   CKTOTAL 110 12/21/2011   CKMB 3.5 12/21/2011   TROPONINI <0.30 07/28/2011      EKG: Normal sinus rhythm, normal intervals, anterior wall myocardial infarction, old compared to the previous EKG done yesterday, ST segment depression has improved significantly. EKG is pretty much back to baseline.  FOLLOW UP PLANS AND APPOINTMENTS to be seen by me in 2 weeks Discharge Orders    Future Appointments: Provider: Department: Dept Phone: Center:   12/23/2011 6:45 AM [facility] 3 [facility]ting [phone] [facility]   12/26/2011 6:45 AM [facility] Monitor 3 [facility] [phone] [facility]   12/28/2011 6:45 AM [facility] 3 [facility] [phone] [facility]       Medication List     As of 12/22/2011  9:54 AM    TAKE these medications         acetaminophen 500 MG tablet   Commonly known as: TYLENOL   Take 1,000 mg by mouth every 6 (six) hours as needed. For pain      aspirin EC 81 MG tablet   Take 81  mg by mouth at bedtime.      carvedilol 6.25 MG tablet   Commonly known as: COREG   Take 6.25 mg by mouth daily.      cilostazol 50 MG tablet   Commonly known as: PLETAL   Take 1 tablet (50 mg total) by mouth 2 (two) times daily.      cyclobenzaprine 10 MG tablet   Commonly known as: FLEXERIL   Take 10 mg by mouth every 8 (eight) hours as needed. For muscle spasms      Fish Oil  1200 MG Caps   Take 1 capsule by mouth daily.      insulin lispro 100 UNIT/ML injection   Commonly known as: HUMALOG   Inject 7 Units into the skin 3 (three) times daily before meals.      LANTUS SOLOSTAR 100 UNIT/ML injection   Generic drug: insulin glargine   Inject 20 Units into the skin at bedtime.      omeprazole 20 MG capsule   Commonly known as: PRILOSEC   Take 1 capsule (20 mg total) by mouth 2 (two) times daily.      prasugrel 10 MG Tabs   Commonly known as: EFFIENT   Take 1 tablet (10 mg total) by mouth daily.      promethazine 25 MG tablet   Commonly known as: PHENERGAN   Take 25 mg by mouth every 6 (six) hours as needed. For nausea/vomiting      simvastatin 20 MG tablet   Commonly known as: ZOCOR   Take 20 mg by mouth every evening.           [name], MD 12/22/2011, 9:47 AM

## 2011-12-23 ENCOUNTER — Encounter (HOSPITAL_COMMUNITY): Payer: Medicare HMO

## 2011-12-26 ENCOUNTER — Encounter (HOSPITAL_COMMUNITY): Payer: Medicare HMO

## 2011-12-26 MED FILL — Dextrose Inj 5%: INTRAVENOUS | Qty: 50 | Status: AC

## 2011-12-26 MED FILL — Bivalirudin Trifluoroacetate For IV Soln 250 MG (Base Equiv): INTRAVENOUS | Qty: 250 | Status: AC

## 2011-12-28 ENCOUNTER — Encounter (HOSPITAL_COMMUNITY): Payer: Medicare HMO

## 2011-12-30 ENCOUNTER — Encounter (HOSPITAL_COMMUNITY): Payer: Medicare HMO

## 2012-01-02 ENCOUNTER — Encounter (HOSPITAL_COMMUNITY): Payer: Medicare HMO

## 2012-01-04 ENCOUNTER — Encounter (HOSPITAL_COMMUNITY): Payer: Medicare HMO | Attending: Cardiology

## 2012-01-04 DIAGNOSIS — I2510 Atherosclerotic heart disease of native coronary artery without angina pectoris: Secondary | ICD-10-CM | POA: Insufficient documentation

## 2012-01-04 DIAGNOSIS — Z5189 Encounter for other specified aftercare: Secondary | ICD-10-CM | POA: Insufficient documentation

## 2012-01-04 DIAGNOSIS — K219 Gastro-esophageal reflux disease without esophagitis: Secondary | ICD-10-CM | POA: Insufficient documentation

## 2012-01-04 DIAGNOSIS — I10 Essential (primary) hypertension: Secondary | ICD-10-CM | POA: Insufficient documentation

## 2012-01-04 DIAGNOSIS — E119 Type 2 diabetes mellitus without complications: Secondary | ICD-10-CM | POA: Insufficient documentation

## 2012-01-04 DIAGNOSIS — Z9861 Coronary angioplasty status: Secondary | ICD-10-CM | POA: Insufficient documentation

## 2012-01-06 ENCOUNTER — Encounter (HOSPITAL_COMMUNITY): Payer: Medicare HMO

## 2012-01-09 ENCOUNTER — Encounter (HOSPITAL_COMMUNITY)
Admission: RE | Admit: 2012-01-09 | Discharge: 2012-01-09 | Disposition: A | Discharge status: Home / Self Care | Payer: Medicare HMO | Source: Ambulatory Visit | Attending: Cardiology | Admitting: Cardiology

## 2012-01-09 DIAGNOSIS — Z5189 Encounter for other specified aftercare: Secondary | ICD-10-CM | POA: Insufficient documentation

## 2012-01-09 DIAGNOSIS — E119 Type 2 diabetes mellitus without complications: Secondary | ICD-10-CM | POA: Insufficient documentation

## 2012-01-09 DIAGNOSIS — I2510 Atherosclerotic heart disease of native coronary artery without angina pectoris: Secondary | ICD-10-CM | POA: Insufficient documentation

## 2012-01-09 DIAGNOSIS — I10 Essential (primary) hypertension: Secondary | ICD-10-CM | POA: Insufficient documentation

## 2012-01-09 DIAGNOSIS — K219 Gastro-esophageal reflux disease without esophagitis: Secondary | ICD-10-CM | POA: Insufficient documentation

## 2012-01-09 DIAGNOSIS — Z9861 Coronary angioplasty status: Secondary | ICD-10-CM | POA: Insufficient documentation

## 2012-01-11 ENCOUNTER — Encounter (HOSPITAL_COMMUNITY)
Admission: RE | Admit: 2012-01-11 | Discharge: 2012-01-11 | Disposition: A | Discharge status: Home / Self Care | Payer: Medicare HMO | Source: Ambulatory Visit | Attending: Cardiology | Admitting: Cardiology

## 2012-01-13 ENCOUNTER — Encounter (HOSPITAL_COMMUNITY)
Admission: RE | Admit: 2012-01-13 | Discharge: 2012-01-13 | Disposition: A | Discharge status: Home / Self Care | Payer: Medicare HMO | Source: Ambulatory Visit | Attending: Cardiology | Admitting: Cardiology

## 2012-01-24 NOTE — Patient Instructions (Signed)
Pt has graduated from CR on 01/13/12. Pt has been given home exercise packet. Pt has expressed an understanding of all the materials given. Informed patient that we would be making follow up calls for 1 year.

## 2012-01-24 NOTE — Progress Notes (Signed)
Patient graduated from Cardiac Rehabilitation today on 01/13/12 after completing 36 sessions.She achieved LTG (Long Term Goal)  of 30 minutes of aerobic exercise at [facility] Met level of 4.03. All patients vitals are WNL. Patient has met with dietician. Discharge instruction has been reviewed in detail and patient stated an understanding of material given. Patient plans to exercise at home on her Treadmill. Cardiac Rehab staff will make f/u calls at 1 month, 6 months, and 1 year. Patient had no complaints of any abnormal S/S or pain on their exit visit.

## 2012-01-25 NOTE — Progress Notes (Signed)
Cardiac Rehabilitation Program Outcomes Report   Orientation:  06/20/201 !8th Visit: 10/07/2011 Graduate Date:  tbd Discharge Date:  tbd # of sessions completed: 19 DX: CAD and Stent X1  Cardiologist: Jacinto Halim Family MD:  Dr. Larey Brick Time:  06:45  A.  Exercise Program:  Tolerates exercise @ 4.04 METS for 15 minutes, Walk Test Results:  Pre: Walked track 1047ft at 1.89 miles, No Change functional capacity  0 %, Improved  muscular strength  2.44 % and No Change  flexibility 0  %  B.  Mental Health:  Good mental attitude  C.  Education/Instruction/Skills  Knows THR for exercise and Uses Perceived Exertion Scale and/or Dyspnea Scale  Uses Perceived Exertion Scale and/or Dyspnea Scale  D.  Nutrition/Weight Control/Body Composition:  Adherence to prescribed nutrition program: good  and Patient has lost -5.6 kg   E.  Blood Lipids    No results found for this basename: CHOL, HDL, LDLCALC, LDLDIRECT, TRIG, CHOLHDL    F.  Lifestyle Changes:  Making positive lifestyle changes  G.  Symptoms noted with exercise:  Asymptomatic  Report Completed By:  Lelon Huh. Andersen Iorio RN   Comments:  This is patients Halfway report. She achieved a Peak Mets of 4.04. Her resting HR was 75 and resting BP was 128/58. Her peak HR was 105 and peak BP was 150/70. A graduation report will follow.

## 2012-01-25 NOTE — Progress Notes (Signed)
Cardiac Rehabilitation Program Outcomes Report   Orientation:  08/25/2011 Graduate Date:  01/13/2012 Discharge Date:  01/13/2012 # of sessions completed: 36 DX: CAD and StentX1  Cardiologist: Jacinto Halim Family MD:  Larey Brick Time:  06:45  A.  Exercise Program:  Tolerates exercise @ 4.03 METS for 15 minutes, Bike Test Results:  Post: Walked 1066ft aat 2.27 MPH, No Change functional capacity  0 %, Improved  muscular strength  2.44 %, No Change  flexibility 0 % and Discharged to home exercise program.  Anticipated compliance:  excellent  B.  Mental Health:  Good mental attitude, Significant family stress and Quality of Life (QOL)  improvements:  Overall  4.29 %, Health/Functioning 7.67 %, Socioeconomics -19.10 %, Psych/Spiritual 6.97 %, Family 18.70 %    C.  Education/Instruction/Skills  Accurately checks own pulse.  Rest:  31  Exercise:97, Knows THR for exercise and Uses Perceived Exertion Scale and/or Dyspnea Scale  Uses Perceived Exertion Scale and/or Dyspnea Scale  D.  Nutrition/Weight Control/Body Composition:  Adherence to prescribed nutrition program: good , % Body Fat  38.1, Patient has lost -5.6 kg, CBG self-monitoring and Glycemic Control:  good    E.  Blood Lipids    No results found for this basename: CHOL, HDL, LDLCALC, LDLDIRECT, TRIG, CHOLHDL    F.  Lifestyle Changes:  Making positive lifestyle changes  G.  Symptoms noted with exercise:  Asymptomatic  Report Completed By:  Lelon Huh. Anays Detore RN   Comments:  This is patients graduation report . She achieved a peak Mets of 4.03. Her resting HR was 87 and resting  BP was 140/80. Her peak HR was 102 and peak BP was 160/60. She did very well while in rehab.

## 2012-02-17 ENCOUNTER — Encounter (HOSPITAL_COMMUNITY): Payer: Self-pay | Admitting: Emergency Medicine

## 2012-02-17 ENCOUNTER — Inpatient Hospital Stay (HOSPITAL_COMMUNITY)
Admission: EM | Admit: 2012-02-17 | Discharge: 2012-02-21 | DRG: 247 | Disposition: A | Discharge status: Home / Self Care | Payer: Medicare HMO | Attending: Cardiology | Admitting: Cardiology

## 2012-02-17 ENCOUNTER — Emergency Department (HOSPITAL_COMMUNITY): Payer: Medicare HMO

## 2012-02-17 DIAGNOSIS — R079 Chest pain, unspecified: Secondary | ICD-10-CM

## 2012-02-17 DIAGNOSIS — E785 Hyperlipidemia, unspecified: Secondary | ICD-10-CM | POA: Diagnosis present

## 2012-02-17 DIAGNOSIS — D649 Anemia, unspecified: Secondary | ICD-10-CM | POA: Diagnosis present

## 2012-02-17 DIAGNOSIS — Z85828 Personal history of other malignant neoplasm of skin: Secondary | ICD-10-CM

## 2012-02-17 DIAGNOSIS — T82897A Other specified complication of cardiac prosthetic devices, implants and grafts, initial encounter: Secondary | ICD-10-CM | POA: Diagnosis present

## 2012-02-17 DIAGNOSIS — Y849 Medical procedure, unspecified as the cause of abnormal reaction of the patient, or of later complication, without mention of misadventure at the time of the procedure: Secondary | ICD-10-CM | POA: Diagnosis present

## 2012-02-17 DIAGNOSIS — I2510 Atherosclerotic heart disease of native coronary artery without angina pectoris: Secondary | ICD-10-CM | POA: Diagnosis present

## 2012-02-17 DIAGNOSIS — I10 Essential (primary) hypertension: Secondary | ICD-10-CM | POA: Diagnosis present

## 2012-02-17 DIAGNOSIS — I214 Non-ST elevation (NSTEMI) myocardial infarction: Secondary | ICD-10-CM

## 2012-02-17 DIAGNOSIS — R7989 Other specified abnormal findings of blood chemistry: Secondary | ICD-10-CM

## 2012-02-17 DIAGNOSIS — R0602 Shortness of breath: Secondary | ICD-10-CM

## 2012-02-17 DIAGNOSIS — Z8249 Family history of ischemic heart disease and other diseases of the circulatory system: Secondary | ICD-10-CM

## 2012-02-17 DIAGNOSIS — I252 Old myocardial infarction: Secondary | ICD-10-CM

## 2012-02-17 DIAGNOSIS — IMO0001 Reserved for inherently not codable concepts without codable children: Secondary | ICD-10-CM | POA: Diagnosis present

## 2012-02-17 DIAGNOSIS — Z9861 Coronary angioplasty status: Secondary | ICD-10-CM

## 2012-02-17 DIAGNOSIS — K219 Gastro-esophageal reflux disease without esophagitis: Secondary | ICD-10-CM | POA: Diagnosis present

## 2012-02-17 DIAGNOSIS — E7800 Pure hypercholesterolemia, unspecified: Secondary | ICD-10-CM | POA: Diagnosis present

## 2012-02-17 LAB — CBC WITH DIFFERENTIAL/PLATELET
Basophils Absolute: 0 10*3/uL (ref 0.0–0.1)
Basophils Relative: 0 % (ref 0–1)
Eosinophils Absolute: 0.2 10*3/uL (ref 0.0–0.7)
Eosinophils Relative: 2 % (ref 0–5)
Eosinophils Relative: 3 % (ref 0–5)
HCT: 31.9 % — ABNORMAL LOW (ref 36.0–46.0)
Lymphocytes Relative: 38 % (ref 12–46)
Lymphs Abs: 2.7 10*3/uL (ref 0.7–4.0)
MCH: 29.5 pg (ref 26.0–34.0)
MCHC: 32.9 g/dL (ref 30.0–36.0)
MCHC: 33.3 g/dL (ref 30.0–36.0)
MCV: 88.6 fL (ref 78.0–100.0)
MCV: 88.5 fL (ref 78.0–100.0)
Monocytes Absolute: 0.4 10*3/uL (ref 0.1–1.0)
Neutrophils Relative %: 44 % (ref 43–77)
Platelets: 226 10*3/uL (ref 150–400)
RBC: 3.49 MIL/uL — ABNORMAL LOW (ref 3.87–5.11)
RDW: 12.5 % (ref 11.5–15.5)
RDW: 12.5 % (ref 11.5–15.5)
WBC: 5.8 10*3/uL (ref 4.0–10.5)

## 2012-02-17 LAB — POCT I-STAT TROPONIN I: Troponin i, poc: 1.15 ng/mL (ref 0.00–0.08)

## 2012-02-17 LAB — COMPREHENSIVE METABOLIC PANEL
AST: 25 U/L (ref 0–37)
CO2: 23 mEq/L (ref 19–32)
Calcium: 9.1 mg/dL (ref 8.4–10.5)
Creatinine, Ser: 1.46 mg/dL — ABNORMAL HIGH (ref 0.50–1.10)
GFR calc Af Amer: 39 mL/min — ABNORMAL LOW (ref >90)
GFR calc non Af Amer: 33 mL/min — ABNORMAL LOW (ref >90)

## 2012-02-17 LAB — PRO B NATRIURETIC PEPTIDE: Pro B Natriuretic peptide (BNP): 4303 pg/mL — ABNORMAL HIGH (ref 0–450)

## 2012-02-17 MED ORDER — ASPIRIN 81 MG PO CHEW
81.0000 mg | CHEWABLE_TABLET | Freq: Every day | ORAL | Status: DC
  Administered 2012-02-18 – 2012-02-19 (×2): 81 mg via ORAL
  Filled 2012-02-17 (×2): qty 1

## 2012-02-17 MED ORDER — NITROGLYCERIN IN D5W 200-5 MCG/ML-% IV SOLN
10.0000 ug/min | INTRAVENOUS | Status: DC
  Administered 2012-02-17: 10 ug/min via INTRAVENOUS
  Administered 2012-02-19: 30 ug/min via INTRAVENOUS
  Filled 2012-02-17 (×2): qty 250

## 2012-02-17 MED ORDER — INSULIN ASPART 100 UNIT/ML ~~LOC~~ SOLN
0.0000 [IU] | Freq: Three times a day (TID) | SUBCUTANEOUS | Status: DC
  Administered 2012-02-18: 5 [IU] via SUBCUTANEOUS
  Administered 2012-02-19: 2 [IU] via SUBCUTANEOUS
  Administered 2012-02-19: 1 [IU] via SUBCUTANEOUS
  Administered 2012-02-19: 5 [IU] via SUBCUTANEOUS
  Administered 2012-02-20: 2 [IU] via SUBCUTANEOUS
  Administered 2012-02-20 – 2012-02-21 (×2): 1 [IU] via SUBCUTANEOUS

## 2012-02-17 MED ORDER — HEPARIN BOLUS VIA INFUSION
4000.0000 [IU] | Freq: Once | INTRAVENOUS | Status: AC
  Administered 2012-02-17: 4000 [IU] via INTRAVENOUS

## 2012-02-17 MED ORDER — ONDANSETRON HCL 4 MG/2ML IJ SOLN: 4.0000 mg | Freq: Four times a day (QID) | INTRAMUSCULAR | Status: DC | PRN

## 2012-02-17 MED ORDER — LISINOPRIL 5 MG PO TABS
5.0000 mg | ORAL_TABLET | Freq: Every day | ORAL | Status: DC
  Administered 2012-02-18 – 2012-02-21 (×4): 5 mg via ORAL
  Filled 2012-02-17 (×4): qty 1

## 2012-02-17 MED ORDER — CARVEDILOL 6.25 MG PO TABS
6.2500 mg | ORAL_TABLET | Freq: Two times a day (BID) | ORAL | Status: DC
  Administered 2012-02-18 – 2012-02-21 (×7): 6.25 mg via ORAL
  Filled 2012-02-17 (×9): qty 1

## 2012-02-17 MED ORDER — PRASUGREL HCL 10 MG PO TABS
10.0000 mg | ORAL_TABLET | Freq: Every day | ORAL | Status: DC
  Administered 2012-02-18 – 2012-02-21 (×3): 10 mg via ORAL
  Filled 2012-02-17 (×4): qty 1

## 2012-02-17 MED ORDER — ASPIRIN EC 81 MG PO TBEC: 81.0000 mg | DELAYED_RELEASE_TABLET | Freq: Every day | ORAL | Status: DC

## 2012-02-17 MED ORDER — PANTOPRAZOLE SODIUM 40 MG PO TBEC
40.0000 mg | DELAYED_RELEASE_TABLET | Freq: Every day | ORAL | Status: DC
  Administered 2012-02-18 – 2012-02-21 (×3): 40 mg via ORAL
  Filled 2012-02-17 (×3): qty 1

## 2012-02-17 MED ORDER — HEPARIN (PORCINE) IN NACL 100-0.45 UNIT/ML-% IJ SOLN
1550.0000 [IU]/h | INTRAMUSCULAR | Status: DC
  Administered 2012-02-17: 1000 [IU]/h via INTRAVENOUS
  Administered 2012-02-18: 1350 [IU]/h via INTRAVENOUS
  Administered 2012-02-20: 1550 [IU]/h via INTRAVENOUS
  Filled 2012-02-17 (×7): qty 250

## 2012-02-17 MED ORDER — INSULIN GLARGINE 100 UNIT/ML ~~LOC~~ SOLN
20.0000 [IU] | Freq: Every day | SUBCUTANEOUS | Status: DC
  Administered 2012-02-17 – 2012-02-20 (×4): 20 [IU] via SUBCUTANEOUS

## 2012-02-17 MED ORDER — NITROGLYCERIN 0.4 MG SL SUBL: 0.4000 mg | SUBLINGUAL_TABLET | SUBLINGUAL | Status: DC | PRN

## 2012-02-17 MED ORDER — NITROGLYCERIN 0.4 MG SL SUBL
0.4000 mg | SUBLINGUAL_TABLET | SUBLINGUAL | Status: DC | PRN
  Administered 2012-02-17: 0.4 mg via SUBLINGUAL

## 2012-02-17 MED ORDER — INSULIN ASPART 100 UNIT/ML ~~LOC~~ SOLN: 0.0000 [IU] | Freq: Every day | SUBCUTANEOUS | Status: DC

## 2012-02-17 MED ORDER — ACETAMINOPHEN 325 MG PO TABS: 650.0000 mg | ORAL_TABLET | ORAL | Status: DC | PRN

## 2012-02-17 MED ORDER — ATORVASTATIN CALCIUM 80 MG PO TABS
80.0000 mg | ORAL_TABLET | Freq: Every day | ORAL | Status: DC
  Administered 2012-02-17 – 2012-02-20 (×4): 80 mg via ORAL
  Filled 2012-02-17 (×5): qty 1

## 2012-02-17 MED ORDER — OMEGA-3-ACID ETHYL ESTERS 1 G PO CAPS
1.0000 g | ORAL_CAPSULE | Freq: Two times a day (BID) | ORAL | Status: DC
  Administered 2012-02-17 – 2012-02-21 (×7): 1 g via ORAL
  Filled 2012-02-17 (×9): qty 1

## 2012-02-17 NOTE — ED Notes (Signed)
Notified PA blood draw completed and in lab.  Notified patient about delay verbalized understanding.  Currently patient resting comfortably on stretcher. Family member at bedside.

## 2012-02-17 NOTE — ED Provider Notes (Signed)
History     CSN: [id number]  Arrival date & time 02/17/12  1646   First MD Initiated Contact with Patient 02/17/12 1703      Chief Complaint  Patient presents with  . Chest Pain    (Consider location/radiation/quality/duration/timing/severity/associated sxs/prior treatment) The history is provided by the patient and medical records.    Claudia [name] is a 78 y.o. female  with a hx of CAD, CABG and cardiac stint placement presents to the Emergency Department complaining of gradual, persistent, progressively worsening chest pressure and shortness of breath onset 3 days ago with acute worsening yesterday.  Pt felt there was something wrong with her heart and made and appointment with her PCP today.  At the appointment pt had ECG changes and was sent to the ED via EMS. Associated symptoms include nausea and general fatigue.  She also states she feels "off balance" but has not fallen or felt as if she might pass out.  Rest makes it some better and nothing makes it worse.  Pt denies fever, chills, headache, abdominal pain, vomiting, diarrhea, syncope, dysuria, hematuria, syncope.     Past Medical History  Diagnosis Date  . GERD (gastroesophageal reflux disease)   . PUD (peptic ulcer disease)   . Anemia   . Hypercholesterolemia   . Hypertension   . S/P endoscopy Aug 2011    3 superficial gastric ulcers, NSAID-induced  . S/P colonoscopy Sept 2011    left-sided diverticula, tubular adenoma  . Coronary artery disease   . Shortness of breath   . Diabetes mellitus     insulin dependent  . Headache     rare migraines  . Cancer     hx of skin cancer  . Arthritis     Past Surgical History  Procedure Date  . Tonsillectomy   . Appendectomy   . Eye surgery     cataracts  . Cardiac stents 07/19/2011  . Cardiac catheterization 07/19/2011  . Esophagogastroduodenoscopy 10/16/09    normal without barrett's/three superficial gastric ulcers  . Colonoscopy 12/04/09    normal  rectum/left-sided diverticula    No family history on file.  History  Substance Use Topics  . Smoking status: Never Smoker   . Smokeless tobacco: Never Used  . Alcohol Use: No    OB History    Grav Para Term Preterm Abortions TAB SAB Ect Mult Living                  Review of Systems  Constitutional: Positive for fatigue. Negative for fever, diaphoresis, appetite change and unexpected weight change.  HENT: Negative for mouth sores and neck stiffness.   Eyes: Negative for visual disturbance.  Respiratory: Positive for shortness of breath. Negative for cough, chest tightness and wheezing.   Cardiovascular: Positive for chest pain.  Gastrointestinal: Positive for nausea. Negative for vomiting, abdominal pain, diarrhea and constipation.  Genitourinary: Negative for dysuria, urgency, frequency and hematuria.  Musculoskeletal: Negative for back pain.  Skin: Negative for rash.  Neurological: Negative for syncope, light-headedness and headaches.  Hematological: Does not bruise/bleed easily.  Psychiatric/Behavioral: Negative for sleep disturbance. The patient is not nervous/anxious.     Allergies  Aspirin  Home Medications   Current Outpatient Rx  Name  Route  Sig  Dispense  Refill  . ACETAMINOPHEN 500 MG PO TABS   Oral   Take 500 mg by mouth 2 (two) times daily. For pain         . ASPIRIN 81 MG PO  CHEW   Oral   Chew 81 mg by mouth daily.         Marland Kitchen CARVEDILOL 6.25 MG PO TABS   Oral   Take 6.25 mg by mouth 2 (two) times daily with a meal.          . CILOSTAZOL 50 MG PO TABS   Oral   Take 50 mg by mouth at bedtime.         Marland Kitchen CIPROFLOXACIN HCL 500 MG PO TABS   Oral   Take 500 mg by mouth 2 (two) times daily. For 7 days. Started 12.6.13 ending 12.13.13         . CYCLOBENZAPRINE HCL 10 MG PO TABS   Oral   Take 10 mg by mouth every 8 (eight) hours as needed. For muscle spasms         . INSULIN GLARGINE 100 UNIT/ML [place] SOLN   Subcutaneous   Inject 20 Units  into the skin at bedtime.         . INSULIN LISPRO (HUMAN) 100 UNIT/ML  SOLN   Subcutaneous   Inject 7-10 Units into the skin 3 (three) times daily before meals.          Marland Kitchen LISINOPRIL 20 MG PO TABS   Oral   Take 20 mg by mouth daily.         Marland Kitchen FISH OIL 1200 MG PO CAPS   Oral   Take 1 capsule by mouth 2 (two) times daily.          Marland Kitchen OMEPRAZOLE 20 MG PO CPDR   Oral   Take 20 mg by mouth daily.         Marland Kitchen PRASUGREL HCL 10 MG PO TABS   Oral   Take 1 tablet (10 mg total) by mouth daily.   30 tablet   0   . SIMVASTATIN 20 MG PO TABS   Oral   Take 20 mg by mouth every evening.           BP 146/62  Pulse 76  Temp 97 F (36.1 C) (Oral)  Resp 14  SpO2 99%  Physical Exam  Nursing note and vitals reviewed. Constitutional: She is oriented to person, place, and time. She appears well-developed and well-nourished. No distress.  HENT:  Head: Normocephalic and atraumatic.  Mouth/Throat: Oropharynx is clear and moist. No oropharyngeal exudate.  Eyes: Conjunctivae normal are normal. Pupils are equal, round, and reactive to light. No scleral icterus.  Neck: Normal range of motion. Neck supple.  Cardiovascular: Normal rate, regular rhythm, S1 normal, S2 normal, normal heart sounds and intact distal pulses.  Exam reveals no gallop and no friction rub.   No murmur heard. Pulses:      Radial pulses are 2+ on the right side, and 2+ on the left side.       Dorsalis pedis pulses are 2+ on the right side, and 2+ on the left side.       Posterior tibial pulses are 2+ on the right side, and 2+ on the left side.  Pulmonary/Chest: Effort normal and breath sounds normal. No respiratory distress. She has no wheezes. She has no rales. She exhibits no tenderness.  Abdominal: Soft. Bowel sounds are normal. She exhibits no distension and no mass. There is no tenderness. There is no rebound and no guarding.  Musculoskeletal: Normal range of motion. She exhibits edema (chronic in nature, pt  reports no worse than normal).  Lymphadenopathy:    She  has no cervical adenopathy.  Neurological: She is alert and oriented to person, place, and time. She exhibits normal muscle tone. Coordination normal.       Speech is clear and goal oriented Moves extremities without ataxia  Skin: Skin is warm and dry. No rash noted. She is not diaphoretic. No erythema.  Psychiatric: She has a normal mood and affect.    ED Course  Procedures (including critical care time)  Labs Reviewed  CBC WITH DIFFERENTIAL - Abnormal; Notable for the following:    RBC 3.60 (*)     Hemoglobin 10.5 (*)     HCT 31.9 (*)     All other components within normal limits  POCT I-STAT TROPONIN I - Abnormal; Notable for the following:    Troponin i, poc 1.15 (*)     All other components within normal limits  COMPREHENSIVE METABOLIC PANEL  PRO B NATRIURETIC PEPTIDE   Dg Chest 2 View  02/17/2012  *RADIOLOGY REPORT*  Clinical Data: Chest pain and shortness of breath.  Cough.  CHEST - 2 VIEW  Comparison: None.  Findings: There is no acute infiltrate or effusions.  Heart size is normal.  Slight atelectasis or scarring at the right lung base posteriorly.  No acute osseous abnormality.  IMPRESSION: Slight scarring or atelectasis at the right lung base.   Original Report Authenticated By: Francene Boyers, M.D.     ECG:  Date: 02/17/2012  Rate: 81  Rhythm: normal sinus rhythm  QRS Axis: normal  Intervals: normal  ST/T Wave abnormalities: ST depressions laterally  Conduction Disutrbances:none  Narrative Interpretation: new ST depression in V4-V6  Old EKG Reviewed: changes noted    1. Chest pain   2. Unstable angina   3. Shortness of breath   4. Elevated troponin   5. NSTEMI (non-ST elevated myocardial infarction)    CRITICAL CARE Performed by: Dierdre Forth   Total critical care time:  Critical care time was exclusive of separately billable procedures and treating other patients.  Critical care  was necessary to treat or prevent imminent or life-threatening deterioration.  Critical care was time spent personally by me on the following activities: development of treatment plan with patient and/or surrogate as well as nursing, discussions with consultants, evaluation of patient's response to treatment, examination of patient, obtaining history from patient or surrogate, ordering and performing treatments and interventions, ordering and review of laboratory studies, ordering and review of radiographic studies, pulse oximetry and re-evaluation of patient's condition.    MDM  Claudia [name] presents with CP, SOB and significant cardiac Hx.  Pt states these symptoms are the same as her last cardiac episode.  ECG with ST depression in V4-V6, new from the last ECG and ischemic in nature.  Concern for ACS.  Pt given nitroglycerin with complete pain relief.    Pt with troponin 1.15 consistent with NSTEMI.  Heparin started.  Pt's cardiologist,  Dr Sharyn Lull consulted and will admit.     Claudia Client Lania Zawistowski, PA-C 02/18/12 [phone]

## 2012-02-17 NOTE — ED Notes (Signed)
Called lab stated does not have any blood for patient.  Phlebotomy is delayed will start second IV and draw blood now.

## 2012-02-17 NOTE — ED Provider Notes (Signed)
Medical screening examination/treatment/procedure(s) were conducted as a shared visit with non-physician practitioner(s) and myself.  I personally evaluated the patient during the encounter  Pt with new ST depressions and positive troponin, although symptoms have been mild. NTG and heparin drips ordered. Discussed with Dr. Sharyn Lull who will admit.   Charles B. Bernette Mayers, MD 02/17/12 1958

## 2012-02-17 NOTE — ED Notes (Addendum)
Pt arrived by EMS from MD office. Pt c/o CP with some shortness of breath. Started 3 days ago. Pt has hx of stents. General PCP thinks possible closing off of stent. In November pt stents closed up. EMS admin ASA 324mg  and 1 Nitro. EMS 12lead EKG shows some ST depression.

## 2012-02-17 NOTE — H&P (Signed)
Claudia [name] is an 78 y.o. female.   Chief Complaint: Recurrent chest pain/exertional dyspnea HPI: Patient is 78 year old female with past medical history significant for coronary artery disease status post PTCA stenting to left circumflex and ramus branch in May of 2013 subsequently had aggressive restenosis requiring balloon angioplasty in October of 2013, history of silent inferior wall myocardial infarction, hypertension, insulin requiring diabetes mellitus, hypercholesteremia, positive family history of coronary artery disease, came to the ER by EMS from [facility] office. Patient complains of recurrent retrosternal chest pain described as pressure or dullness associated with progressive increasing shortness of breath for last 2-3 days. Denies any nausea vomiting diaphoresis. Denies any palpitation lightheadedness or syncope. EKG done in [facility] office was abnormal and was transferred to [facility]. He stayed in the ER showed normal sinus rhythm with septal Q waves and ST depression in inferolateral leads which were new as compared to prior EKG and was noted to have minimally elevated troponin I. Patient received one sublingual nitroglycerin in route with relief of chest pain. Patient denies any chest pain at present.  Past Medical History  Diagnosis Date  . GERD (gastroesophageal reflux disease)   . PUD (peptic ulcer disease)   . Anemia   . Hypercholesterolemia   . Hypertension   . S/P endoscopy Aug 2011    3 superficial gastric ulcers, NSAID-induced  . S/P colonoscopy Sept 2011    left-sided diverticula, tubular adenoma  . Coronary artery disease   . Shortness of breath   . Diabetes mellitus     insulin dependent  . Headache     rare migraines  . Cancer     hx of skin cancer  . Arthritis     Past Surgical History  Procedure Date  . Tonsillectomy   . Appendectomy   . Eye surgery     cataracts  . Cardiac stents 07/19/2011  . Cardiac catheterization 07/19/2011  .  Esophagogastroduodenoscopy 10/16/09    normal without barrett's/three superficial gastric ulcers  . Colonoscopy 12/04/09    normal rectum/left-sided diverticula    No family history on file. Social History:  reports that she has never smoked. She has never used smokeless tobacco. She reports that she does not drink alcohol or use illicit drugs.  Allergies:  Allergies  Allergen Reactions  . Aspirin Other (See Comments)    Intolerant to high doses     (Not in a hospital admission)  Results for orders placed during the hospital encounter of 02/17/12 (from the past 48 hour(s))  CBC WITH DIFFERENTIAL     Status: Abnormal   Collection Time   02/17/12  6:55 PM      Component Value Range Comment   WBC 5.8  4.0 - 10.5 K/uL    RBC 3.60 (*) 3.87 - 5.11 MIL/uL    Hemoglobin 10.5 (*) 12.0 - 15.0 g/dL    HCT 69.6 (*) 29.5 - 46.0 %    MCV 88.6  78.0 - 100.0 fL    MCH 29.2  26.0 - 34.0 pg    MCHC 32.9  30.0 - 36.0 g/dL    RDW 28.4  13.2 - 44.0 %    Platelets 240  150 - 400 K/uL    Neutrophils Relative 52  43 - 77 %    Neutro Abs 3.1  1.7 - 7.7 K/uL    Lymphocytes Relative 38  12 - 46 %    Lymphs Abs 2.2  0.7 - 4.0 K/uL    Monocytes Relative  7  3 - 12 %    Monocytes Absolute 0.4  0.1 - 1.0 K/uL    Eosinophils Relative 2  0 - 5 %    Eosinophils Absolute 0.1  0.0 - 0.7 K/uL    Basophils Relative 1  0 - 1 %    Basophils Absolute 0.0  0.0 - 0.1 K/uL   COMPREHENSIVE METABOLIC PANEL     Status: Abnormal   Collection Time   02/17/12  6:55 PM      Component Value Range Comment   Sodium 134 (*) 135 - 145 mEq/L    Potassium 4.6  3.5 - 5.1 mEq/L    Chloride 103  96 - 112 mEq/L    CO2 23  19 - 32 mEq/L    Glucose, Bld 201 (*) 70 - 99 mg/dL    BUN 28 (*) 6 - 23 mg/dL    Creatinine, Ser 1.61 (*) 0.50 - 1.10 mg/dL    Calcium 9.1  8.4 - 09.6 mg/dL    Total Protein 6.6  6.0 - 8.3 g/dL    Albumin 3.3 (*) 3.5 - 5.2 g/dL    AST 25  0 - 37 U/L    ALT 14  0 - 35 U/L    Alkaline Phosphatase 98  39 -  117 U/L    Total Bilirubin 0.4  0.3 - 1.2 mg/dL    GFR calc non Af Amer 33 (*) >90 mL/min    GFR calc Af Amer 39 (*) >90 mL/min   PRO B NATRIURETIC PEPTIDE     Status: Abnormal   Collection Time   02/17/12  6:55 PM      Component Value Range Comment   Pro B Natriuretic peptide (BNP) 4303.0 (*) 0 - 450 pg/mL   POCT I-STAT TROPONIN I     Status: Abnormal   Collection Time   02/17/12  6:58 PM      Component Value Range Comment   Troponin i, poc 1.15 (*) 0.00 - 0.08 ng/mL    Comment NOTIFIED PHYSICIAN      Comment 3            POCT I-STAT TROPONIN I     Status: Abnormal   Collection Time   02/17/12  8:07 PM      Component Value Range Comment   Troponin i, poc 1.13 (*) 0.00 - 0.08 ng/mL    Comment NOTIFIED PHYSICIAN      Comment 3             Dg Chest 2 View  02/17/2012  *RADIOLOGY REPORT*  Clinical Data: Chest pain and shortness of breath.  Cough.  CHEST - 2 VIEW  Comparison: None.  Findings: There is no acute infiltrate or effusions.  Heart size is normal.  Slight atelectasis or scarring at the right lung base posteriorly.  No acute osseous abnormality.  IMPRESSION: Slight scarring or atelectasis at the right lung base.   Original Report Authenticated By: Francene Boyers, M.D.     Review of Systems  Constitutional: Negative for fever and chills.  Eyes: Negative for blurred vision and double vision.  Respiratory: Negative for cough and hemoptysis.   Cardiovascular: Positive for chest pain and leg swelling. Negative for palpitations, orthopnea and claudication.  Gastrointestinal: Negative for nausea, vomiting and abdominal pain.  Genitourinary: Negative for dysuria and urgency.  Neurological: Negative for dizziness and headaches.    Blood pressure 146/62, pulse 76, temperature 97 F (36.1 C), temperature source Oral, resp. rate 14,  height 5' 6.93" (1.7 m), weight 87.3 kg (192 lb 7.4 oz), SpO2 99.00%. Physical Exam  Constitutional: She is oriented to person, place, and time. She  appears well-developed and well-nourished.  HENT:  Head: Normocephalic and atraumatic.  Eyes: Conjunctivae normal are normal. Pupils are equal, round, and reactive to light. Left eye exhibits no discharge. No scleral icterus.  Neck: Normal range of motion. Neck supple. No JVD present. No tracheal deviation present. No thyromegaly present.  Cardiovascular: Normal rate and regular rhythm.  Exam reveals gallop (soft S4 gallop noted).   No murmur heard. Respiratory: Effort normal and breath sounds normal. No respiratory distress. She has no wheezes. She has no rales.  GI: Soft. Bowel sounds are normal. She exhibits no distension. There is no tenderness. There is no rebound.  Musculoskeletal: She exhibits no edema and no tenderness.  Neurological: She is alert and oriented to person, place, and time.     Assessment/Plan Acute non-Q-wave myocardial infarction Coronary artery disease history of PCI to left circumflex and ramus branch in the past as above History of silent MI in the past Hypertension Insulin requiring diabetes mellitus Hypercholesteremia Morbid obesity Positive family history of coronary artery disease Plan As per orders We'll schedule for a cardiac cath on Monday unless patient have recurrent chest pain or further EKG changes or hemodynamic instability. Dr. Jacinto Halim to follow patient from tomorrow.  Robynn Pane 02/17/2012, 8:34 PM

## 2012-02-17 NOTE — Consult Note (Signed)
ANTICOAGULATION CONSULT NOTE - Initial Consult  Pharmacy Consult for Heparin Indication: chest pain/ACS  Allergies  Allergen Reactions  . Aspirin Other (See Comments)    Intolerant to high doses   Patient Measurements: Height: 5' 6.93" (170 cm) Weight: 192 lb 7.4 oz (87.3 kg) IBW/kg (Calculated) : 61.44  Heparin Dosing Weight: 80kg  Vital Signs: Temp: 97 F (36.1 C) (12/13 1654) Temp src: Oral (12/13 1654) BP: 146/62 mmHg (12/13 1900) Pulse Rate: 76  (12/13 1900)  Labs:  [facility] 02/17/12 1855  HGB 10.5*  HCT 31.9*  PLT 240  APTT --  LABPROT --  INR --  HEPARINUNFRC --  CREATININE 1.46*  CKTOTAL --  CKMB --  TROPONINI --    Estimated Creatinine Clearance: 36 ml/min (by C-G formula based on Cr of 1.46).   Medical History: Past Medical History  Diagnosis Date  . GERD (gastroesophageal reflux disease)   . PUD (peptic ulcer disease)   . Anemia   . Hypercholesterolemia   . Hypertension   . S/P endoscopy Aug 2011    3 superficial gastric ulcers, NSAID-induced  . S/P colonoscopy Sept 2011    left-sided diverticula, tubular adenoma  . Coronary artery disease   . Shortness of breath   . Diabetes mellitus     insulin dependent  . Headache     rare migraines  . Cancer     hx of skin cancer  . Arthritis     Medications:  No anticoagulants pta  Assessment: 80yof with history of CAD s/p stents presents to the ED with CP and positive troponin. She will begin heparin. Renal insufficiency noted.  She has a history of anemia - Hgb/Hct a little low, platelets ok.  Goal of Therapy:  Heparin level 0.3-0.7 units/ml Monitor platelets by anticoagulation protocol: Yes   Plan:  1) Heparin bolus 4000 units x 1 2) Heparin drip at 1000 units/hr 3) 8h heparin level 4) Daily heparin level and CBC  Fredrik Rigger 02/17/2012,8:12 PM

## 2012-02-17 NOTE — ED Notes (Signed)
Awaiting medication arrival from pharmacy.

## 2012-02-17 NOTE — ED Notes (Addendum)
Report received from [place], [place]. Pt A.O. X 4. Respirations even and regular. Skin, warm and dry. Vitals stable. NAD. Ambulatory to restroom. No assistance need. Lab at bedside. No further needs at this time. Pt reports increased stress at work. Pt is employed at [employer]. Pt denies CP currently. Denies SOB. Denies headache. Denies N/V.

## 2012-02-18 LAB — COMPREHENSIVE METABOLIC PANEL
AST: 25 U/L (ref 0–37)
CO2: 23 mEq/L (ref 19–32)
Calcium: 9.1 mg/dL (ref 8.4–10.5)
Creatinine, Ser: 1.32 mg/dL — ABNORMAL HIGH (ref 0.50–1.10)
GFR calc Af Amer: 44 mL/min — ABNORMAL LOW (ref >90)
GFR calc non Af Amer: 38 mL/min — ABNORMAL LOW (ref >90)
Sodium: 139 mEq/L (ref 135–145)
Total Protein: 6.5 g/dL (ref 6.0–8.3)

## 2012-02-18 LAB — TROPONIN I
Troponin I: 1.68 ng/mL (ref <0.30)
Troponin I: 1.51 ng/mL (ref <0.30)
Troponin I: 1.86 ng/mL (ref <0.30)

## 2012-02-18 LAB — CBC
HCT: 30.5 % — ABNORMAL LOW (ref 36.0–46.0)
MCH: 29.9 pg (ref 26.0–34.0)
MCHC: 33 g/dL (ref 30.0–36.0)
MCHC: 32.8 g/dL (ref 30.0–36.0)
MCV: 90.6 fL (ref 78.0–100.0)
Platelets: 232 10*3/uL (ref 150–400)
Platelets: 219 10*3/uL (ref 150–400)
RDW: 12.8 % (ref 11.5–15.5)
RDW: 12.7 % (ref 11.5–15.5)
WBC: 5.3 10*3/uL (ref 4.0–10.5)

## 2012-02-18 LAB — PROTIME-INR
INR: 1.16 (ref 0.00–1.49)
Prothrombin Time: 14.6 seconds (ref 11.6–15.2)

## 2012-02-18 LAB — LIPID PANEL
Cholesterol: 131 mg/dL (ref 0–200)
HDL: 72 mg/dL (ref >39)
Triglycerides: 42 mg/dL (ref <150)

## 2012-02-18 LAB — CK TOTAL AND CKMB (NOT AT ARMC)
CK, MB: 5.9 ng/mL — ABNORMAL HIGH (ref 0.3–4.0)
CK, MB: 4.6 ng/mL — ABNORMAL HIGH (ref 0.3–4.0)
Relative Index: 4.1 — ABNORMAL HIGH (ref 0.0–2.5)
Total CK: 144 U/L (ref 7–177)

## 2012-02-18 LAB — HEMOGLOBIN A1C
Hgb A1c MFr Bld: 8.1 % — ABNORMAL HIGH (ref <5.7)
Mean Plasma Glucose: 186 mg/dL — ABNORMAL HIGH (ref <117)

## 2012-02-18 LAB — HEPARIN LEVEL (UNFRACTIONATED): Heparin Unfractionated: 0.3 IU/mL (ref 0.30–0.70)

## 2012-02-18 LAB — BASIC METABOLIC PANEL
Calcium: 8.7 mg/dL (ref 8.4–10.5)
Chloride: 108 mEq/L (ref 96–112)
Creatinine, Ser: 1.17 mg/dL — ABNORMAL HIGH (ref 0.50–1.10)
GFR calc Af Amer: 50 mL/min — ABNORMAL LOW (ref >90)
Sodium: 140 mEq/L (ref 135–145)

## 2012-02-18 LAB — GLUCOSE, CAPILLARY
Glucose-Capillary: 134 mg/dL — ABNORMAL HIGH (ref 70–99)
Glucose-Capillary: 197 mg/dL — ABNORMAL HIGH (ref 70–99)
Glucose-Capillary: 253 mg/dL — ABNORMAL HIGH (ref 70–99)
Glucose-Capillary: 62 mg/dL — ABNORMAL LOW (ref 70–99)

## 2012-02-18 LAB — TSH: TSH: 0.447 u[IU]/mL (ref 0.350–4.500)

## 2012-02-18 MED ORDER — METOPROLOL TARTRATE 1 MG/ML IV SOLN
2.5000 mg | Freq: Once | INTRAVENOUS | Status: AC
  Administered 2012-02-18: 2.5 mg via INTRAVENOUS

## 2012-02-18 MED ORDER — MORPHINE SULFATE 2 MG/ML IJ SOLN: 2.0000 mg | INTRAMUSCULAR | Status: DC | PRN

## 2012-02-18 MED ORDER — EPTIFIBATIDE 75 MG/100ML IV SOLN
1.0000 ug/kg/min | INTRAVENOUS | Status: DC
  Administered 2012-02-18 (×2): 1 ug/kg/min via INTRAVENOUS
  Filled 2012-02-18 (×3): qty 100

## 2012-02-18 MED ORDER — METOPROLOL TARTRATE 1 MG/ML IV SOLN
INTRAVENOUS | Status: AC
  Filled 2012-02-18: qty 5

## 2012-02-18 MED ORDER — HEPARIN BOLUS VIA INFUSION
1500.0000 [IU] | Freq: Once | INTRAVENOUS | Status: AC
  Administered 2012-02-18: 1500 [IU] via INTRAVENOUS
  Filled 2012-02-18: qty 1500

## 2012-02-18 MED ORDER — EPTIFIBATIDE BOLUS VIA INFUSION
180.0000 ug/kg | Freq: Once | INTRAVENOUS | Status: AC
  Administered 2012-02-18: 15100 ug via INTRAVENOUS
  Filled 2012-02-18: qty 21

## 2012-02-18 NOTE — Progress Notes (Signed)
Subjective:  Patient is presently asymptomatic with no recurrence of chest pain. She denies any shortness of breath. She states that she slept well.  Objective:  Vital Signs in the last 24 hours: Temp:  [97 F (36.1 C)-98.5 F (36.9 C)] 98.5 F (36.9 C) (12/14 0732) Pulse Rate:  [61-95] 75  (12/14 0800) Resp:  [12-20] 17  (12/13 2200) BP: (109-170)/(40-76) 127/54 mmHg (12/14 0800) SpO2:  [95 %-100 %] 96 % (12/14 0800) Weight:  [83.9 kg (184 lb 15.5 oz)-87.3 kg (192 lb 7.4 oz)] 83.9 kg (184 lb 15.5 oz) (12/13 2309)  Intake/Output from previous day: 12/13 0701 - 12/14 0700 In: 173.9 [I.V.:173.9] Out: -   Physical Exam:   General appearance: alert, cooperative, appears stated age and no distress Eyes: conjunctivae/corneas clear. PERRL, EOM's intact. Fundi benign. Neck: no adenopathy, no carotid bruit, no JVD, supple, symmetrical, trachea midline and thyroid not enlarged, symmetric, no tenderness/mass/nodules Neck: JVP - normal, carotids 2+= without bruits Resp: clear to auscultation bilaterally Chest wall: no tenderness Cardio: regular rate and rhythm, S1, S2 normal, no murmur, click, rub or gallop GI: soft, non-tender; bowel sounds normal; no masses,  no organomegaly Extremities: extremities normal, atraumatic, no cyanosis or edema    Lab Results:  [facility] 02/18/12 0624 02/17/12 2324  WBC 5.3 6.1  HGB 10.2* 10.3*  PLT 232 226    Basename 02/18/12 0624 02/17/12 2324  NA 140 139  K 3.9 3.9  CL 108 106  CO2 23 23  GLUCOSE 82 132*  BUN 20 25*  CREATININE 1.17* 1.32*    Basename 02/18/12 0624 02/17/12 2323  TROPONINI 1.51* 1.68*   Hepatic Function Panel  Basename 02/17/12 2324  PROT 6.5  ALBUMIN 3.3*  AST 25  ALT 13  ALKPHOS 92  BILITOT 0.4  BILIDIR --  IBILI --    Basename 02/18/12 0624  CHOL 131   No results found for this basename: PROTIME in the last 72 hours Lipid Panel     Component Value Date/Time   CHOL 131 02/18/2012 0624   TRIG 42  02/18/2012 0624   HDL 72 02/18/2012 0624   CHOLHDL 1.8 02/18/2012 0624   VLDL 8 02/18/2012 0624   LDLCALC 51 02/18/2012 0624     Imaging: Imaging results have been reviewed  Cardiac Studies:  EKG: Normal sinus rhythm, heart rate 72 beats a minute, the abnormality, LVH, lateral ST segment depression with T wave inversion suggestive of subendocardial infarct versus ischemia.   Assessment/Plan:  1. Non-ST elevation myocardial infarction 2. CAD status post PTCA and stenting. History of S/P PTCA and scoring balloon PTCA of the Ramus intermediate and circumflex in-stent restenosis on 12/21/2011 with 2.5 x 10 mm angiosculpt balloon.  H/O Prox/Mid Circumflex 2.5x25 Promus , ramus intermediate 2.5x26 Resolute DES stent placed on 07/19/11. 3. Diabetes mellitus type 2 controlled 4. Hypertension 5. Hyperlipidemia  Recommendation: Patient's troponin is trending down, remains asymptomatic, I suspect in-stent restenosis in the previously stented segments. I had presumed that she would probably have recurrence after repeat angioplasty as the results were suboptimal due to diffuse in-stent restenosis. I suspect she probably will need repeat angioplasty and repeat sandwich DES stenting. Unless recurrence of chest pain, will perform angiography on Monday on 02/20/2012. Patient is aware of the risks, benefits and alternatives as she has undergone the same previously. I will stop the Integrilin after the percent bottle is done.    [name], M.D. 02/18/2012, 10:09 AM [facility] Pager: [phone] Office: [phone] If no answer:  [phone]  

## 2012-02-18 NOTE — Progress Notes (Signed)
Brief Nutrition Note:  RD pulled to pt for nutrition risk screening tool, score of 3, indicated that she had lost 32 lbs with out trying.   RD spoke with pt who states that she was trying to lose weight, making diet changes and increasing exercise. Pt denies need for education materials or other needs.   Body mass index is 28.97 kg/(m^2). Overweight Diet: Carb Mod Medium. Pudding Thick Liquids. Pt is not on thickened liquids at home. RN confirmed pt with no problems swallowing pills with thin liquids this morning. RD will clarify diet.   Clarene Duke RD, LDN After Hours/weekend pager [phone]

## 2012-02-18 NOTE — Progress Notes (Signed)
Pt c/o chest pain,  Rates a 4-5 out of 10.  States she does not feel good. Titrated up ntg,  Placed on [place] 2 lpm.  ekg done.  Called dr. Sharyn Lull and notified of pt situation.  Also notified of increased troponin.

## 2012-02-18 NOTE — Consult Note (Signed)
ANTICOAGULATION CONSULT NOTE - Follow Up Consult  Pharmacy Consult for Heparin and Integrilin  Indication: chest pain/ACS  Allergies  Allergen Reactions  . Aspirin Other (See Comments)    Intolerant to high doses   Patient Measurements: Height: 5\' 7"  (170.2 cm) Weight: 184 lb 15.5 oz (83.9 kg) IBW/kg (Calculated) : 61.6  Heparin Dosing Weight: 80kg  Vital Signs: Temp: 97.9 F (36.6 C) (12/14 0000) Temp src: Oral (12/13 1654) BP: 147/60 mmHg (12/14 0000) Pulse Rate: 83  (12/14 0000)  Labs:  Basename 02/17/12 2329 02/17/12 2324 02/17/12 2323 02/17/12 1855  HGB -- 10.3* -- 10.5*  HCT -- 30.9* -- 31.9*  PLT -- 226 -- 240  APTT -- 143* -- --  LABPROT -- 14.6 -- --  INR -- 1.16 -- --  HEPARINUNFRC -- -- -- --  CREATININE -- 1.32* -- 1.46*  CKTOTAL 144 -- -- --  CKMB 5.9* -- -- --  TROPONINI -- -- 1.68* --    Estimated Creatinine Clearance: 39.1 ml/min (by C-G formula based on Cr of 1.32).   Medical History: Past Medical History  Diagnosis Date  . GERD (gastroesophageal reflux disease)   . PUD (peptic ulcer disease)   . Anemia   . Hypercholesterolemia   . Hypertension   . S/P endoscopy Aug 2011    3 superficial gastric ulcers, NSAID-induced  . S/P colonoscopy Sept 2011    left-sided diverticula, tubular adenoma  . Coronary artery disease   . Shortness of breath   . Diabetes mellitus     insulin dependent  . Headache     rare migraines  . Cancer     hx of skin cancer  . Arthritis     Medications:  No anticoagulants pta  Assessment: 80yof with history of CAD s/p stents known to pharmacy from heparin management. Pharmacy now consulted to manage Integrilin.   Goal of Therapy:  Heparin level 0.3 - 0.5 (while on Integrilin) Monitor platelets by anticoagulation protocol: Yes   Plan:  1. Continue heparin at 1000 units/hr.  2. Integrilin 180 mcg/kg IV bolus, then IV infusion of 1 mcg/kg/min (est CrCl 39 ml/min)  Emeline Gins 02/18/2012,1:26 AM

## 2012-02-18 NOTE — Consult Note (Signed)
ANTICOAGULATION CONSULT NOTE - Follow Up Consult  Pharmacy Consult for Heparin and Integrilin  Indication: chest pain/ACS  Allergies  Allergen Reactions  . Aspirin Other (See Comments)    Intolerant to high doses   Patient Measurements: Height: 5\' 7"  (170.2 cm) Weight: 184 lb 15.5 oz (83.9 kg) IBW/kg (Calculated) : 61.6  Heparin Dosing Weight: 80kg  Vital Signs: Temp: 98.4 F (36.9 C) (12/14 0400) BP: 124/70 mmHg (12/14 0600) Pulse Rate: 70  (12/14 0600)  Labs:  Basename 02/18/12 0624 02/17/12 2329 02/17/12 2324 02/17/12 2323 02/17/12 1855  HGB 10.2* -- 10.3* -- --  HCT 30.9* -- 30.9* -- 31.9*  PLT 232 -- 226 -- 240  APTT -- -- 143* -- --  LABPROT -- -- 14.6 -- --  INR -- -- 1.16 -- --  HEPARINUNFRC 0.18* -- -- -- --  CREATININE 1.17* -- 1.32* -- 1.46*  CKTOTAL 121 144 -- -- --  CKMB 4.8* 5.9* -- -- --  TROPONINI 1.51* -- -- 1.68* --    Estimated Creatinine Clearance: 44.1 ml/min (by C-G formula based on Cr of 1.17).   Medical History: Past Medical History  Diagnosis Date  . GERD (gastroesophageal reflux disease)   . PUD (peptic ulcer disease)   . Anemia   . Hypercholesterolemia   . Hypertension   . S/P endoscopy Aug 2011    3 superficial gastric ulcers, NSAID-induced  . S/P colonoscopy Sept 2011    left-sided diverticula, tubular adenoma  . Coronary artery disease   . Shortness of breath   . Diabetes mellitus     insulin dependent  . Headache     rare migraines  . Cancer     hx of skin cancer  . Arthritis     Medications:  No anticoagulants pta  Assessment: 80yof with history of CAD s/p stents on IV heparin and Integrilin for ACS. Plan for cath on Monday.   Heparin level (0.18) is below-goal on 1000 units/hr. Integrilin is infusing at 1 mcg/kg/min.   Goal of Therapy:  Heparin level 0.3 - 0.5 (while on Integrilin) Monitor platelets by anticoagulation protocol: Yes   Plan:  1. Heparin IV bolus of 1500 units x 1, then increase IV infusion to  1250 units/hr.  2. Continue Integrilin infusion at 1 mcg/kg/min  3. Heparin level and CBC (for Integrilin) in 8 hours.  Emeline Gins 02/18/2012,7:24 AM

## 2012-02-18 NOTE — Consult Note (Signed)
ANTICOAGULATION CONSULT NOTE - Follow Up Consult  Pharmacy Consult for Heparin and Integrilin  Indication: chest pain/ACS  Allergies  Allergen Reactions  . Aspirin Other (See Comments)    Intolerant to high doses   Patient Measurements: Height: 5\' 7"  (170.2 cm) Weight: 184 lb 15.5 oz (83.9 kg) IBW/kg (Calculated) : 61.6  Heparin Dosing Weight: 80 kg  Vital Signs: Temp: 97.4 F (36.3 C) (12/14 1139) Temp src: Oral (12/14 1139) BP: 140/53 mmHg (12/14 1500) Pulse Rate: 74  (12/14 1500)  Labs:  Basename 02/18/12 1513 02/18/12 1130 02/18/12 0624 02/17/12 2329 02/17/12 2324 02/17/12 2323 02/17/12 1855  HGB 10.0* -- 10.2* -- -- -- --  HCT 30.5* -- 30.9* -- 30.9* -- --  PLT 219 -- 232 -- 226 -- --  APTT -- -- -- -- 143* -- --  LABPROT -- -- -- -- 14.6 -- --  INR -- -- -- -- 1.16 -- --  HEPARINUNFRC 0.30 -- 0.18* -- -- -- --  CREATININE -- -- 1.17* -- 1.32* -- 1.46*  CKTOTAL -- 113 121 144 -- -- --  CKMB -- 4.6* 4.8* 5.9* -- -- --  TROPONINI -- 1.86* 1.51* -- -- 1.68* --    Estimated Creatinine Clearance: 44.1 ml/min (by C-G formula based on Cr of 1.17).   Medical History: Past Medical History  Diagnosis Date  . GERD (gastroesophageal reflux disease)   . PUD (peptic ulcer disease)   . Anemia   . Hypercholesterolemia   . Hypertension   . S/P endoscopy Aug 2011    3 superficial gastric ulcers, NSAID-induced  . S/P colonoscopy Sept 2011    left-sided diverticula, tubular adenoma  . Coronary artery disease   . Shortness of breath   . Diabetes mellitus     insulin dependent  . Headache     rare migraines  . Cancer     hx of skin cancer  . Arthritis     Medications:  No anticoagulants pta  Assessment: 78 yo female with history of CAD s/p stents on IV heparin and Integrilin for ACS. Plan for cath on Monday.   Heparin level is therapeutic on the low end of goal range on 1250 units/hr. Integrilin is infusing at 1 mcg/kg/min. No bleeding noted, CBC stable.  Goal  of Therapy:  Heparin level 0.3 - 0.5 (while on Integrilin) Monitor platelets by anticoagulation protocol: Yes   Plan:  1. Increase heparin drip to 1350 units/hr 2. Continue Integrilin infusion at 1 mcg/kg/min  3. Heparin level and CBC (for Integrilin) in 8 hours.  [place], [address].D., BCPS Clinical Pharmacist Pager: [phone] 02/18/2012 4:08 PM

## 2012-02-19 LAB — CK TOTAL AND CKMB (NOT AT ARMC)
CK, MB: 3.3 ng/mL (ref 0.3–4.0)
Relative Index: INVALID (ref 0.0–2.5)
Relative Index: INVALID (ref 0.0–2.5)
Relative Index: INVALID (ref 0.0–2.5)
Total CK: 66 U/L (ref 7–177)
Total CK: 79 U/L (ref 7–177)
Total CK: 84 U/L (ref 7–177)
Total CK: 87 U/L (ref 7–177)

## 2012-02-19 LAB — CBC
HCT: 28.2 % — ABNORMAL LOW (ref 36.0–46.0)
Hemoglobin: 9.4 g/dL — ABNORMAL LOW (ref 12.0–15.0)
MCH: 30.1 pg (ref 26.0–34.0)
MCHC: 33.3 g/dL (ref 30.0–36.0)
RBC: 3.12 MIL/uL — ABNORMAL LOW (ref 3.87–5.11)

## 2012-02-19 LAB — GLUCOSE, CAPILLARY
Glucose-Capillary: 151 mg/dL — ABNORMAL HIGH (ref 70–99)
Glucose-Capillary: 172 mg/dL — ABNORMAL HIGH (ref 70–99)

## 2012-02-19 LAB — HEPARIN LEVEL (UNFRACTIONATED): Heparin Unfractionated: 0.58 IU/mL (ref 0.30–0.70)

## 2012-02-19 MED ORDER — SODIUM CHLORIDE 0.9 % IV SOLN
INTRAVENOUS | Status: DC
  Administered 2012-02-19: 10 mL/h via INTRAVENOUS

## 2012-02-19 MED ORDER — HEPARIN BOLUS VIA INFUSION
1500.0000 [IU] | Freq: Once | INTRAVENOUS | Status: AC
  Administered 2012-02-19: 1500 [IU] via INTRAVENOUS
  Filled 2012-02-19: qty 1500

## 2012-02-19 MED ORDER — SODIUM CHLORIDE 0.9 % IJ SOLN
3.0000 mL | Freq: Two times a day (BID) | INTRAMUSCULAR | Status: DC
  Administered 2012-02-19: 3 mL via INTRAVENOUS

## 2012-02-19 MED ORDER — SODIUM CHLORIDE 0.9 % IJ SOLN: 3.0000 mL | INTRAMUSCULAR | Status: DC | PRN

## 2012-02-19 MED ORDER — SODIUM CHLORIDE 0.9 % IV SOLN
INTRAVENOUS | Status: DC
  Administered 2012-02-20: 1000 mL via INTRAVENOUS

## 2012-02-19 MED ORDER — SODIUM CHLORIDE 0.9 % IV SOLN: 250.0000 mL | INTRAVENOUS | Status: DC | PRN

## 2012-02-19 NOTE — Progress Notes (Signed)
Subjective:  Patient is presently asymptomatic with no recurrence of chest pain. She denies any shortness of breath. She states that she slept well.  Objective:  Vital Signs in the last 24 hours: Temp:  [97.6 F (36.4 C)-98.7 F (37.1 C)] 97.8 F (36.6 C) (12/15 0800) Pulse Rate:  [60-104] 83  (12/15 1000) Resp:  [14-22] 14  (12/15 0800) BP: (116-157)/(38-62) 132/47 mmHg (12/15 1000) SpO2:  [92 %-99 %] 96 % (12/15 1000)  Intake/Output from previous day: 12/14 0701 - 12/15 0700 In: 2281.4 [P.O.:1640; I.V.:641.4] Out: 2950 [Urine:2950]  Physical Exam:   General appearance: alert, cooperative, appears stated age and no distress Eyes: conjunctivae/corneas clear. PERRL, EOM's intact. Fundi benign. Neck: no adenopathy, no carotid bruit, no JVD, supple, symmetrical, trachea midline and thyroid not enlarged, symmetric, no tenderness/mass/nodules Neck: JVP - normal, carotids 2+= without bruits Resp: clear to auscultation bilaterally Chest wall: no tenderness Cardio: regular rate and rhythm, S1, S2 normal, no murmur, click, rub or gallop GI: soft, non-tender; bowel sounds normal; no masses,  no organomegaly Extremities: extremities normal, atraumatic, no cyanosis or edema    Lab Results:  Basename 02/19/12 0101 02/18/12 1513  WBC 6.2 6.0  HGB 9.4* 10.0*  PLT 215 219    Basename 02/18/12 0624 02/17/12 2324  NA 140 139  K 3.9 3.9  CL 108 106  CO2 23 23  GLUCOSE 82 132*  BUN 20 25*  CREATININE 1.17* 1.32*    Basename 02/18/12 1130 02/18/12 0624  TROPONINI 1.86* 1.51*   Hepatic Function Panel  Basename 02/17/12 2324  PROT 6.5  ALBUMIN 3.3*  AST 25  ALT 13  ALKPHOS 92  BILITOT 0.4  BILIDIR --  IBILI --    Basename 02/18/12 0624  CHOL 131   No results found for this basename: PROTIME in the last 72 hours Lipid Panel     Component Value Date/Time   CHOL 131 02/18/2012 0624   TRIG 42 02/18/2012 0624   HDL 72 02/18/2012 0624   CHOLHDL 1.8 02/18/2012 0624   VLDL 8 02/18/2012 0624   LDLCALC 51 02/18/2012 0624    Cardiac Studies:  EKG: 02/19/12: Normal sinus rhythm, heart rate 72 beats a minute, the abnormality, LVH, lateral ST segment depression with T wave inversion suggestive of subendocardial infarct versus ischemia. Compared to yesterday, lateral ST depression slightly improved.  Assessment/Plan:  1. Non-ST elevation myocardial infarction. EKG today the lateral ST depression slightly improved. No further chest pain. CKMB flat and trending down. For cath tomorrow.  2. CAD status post PTCA and stenting. History of S/P PTCA and scoring balloon PTCA of the Ramus intermediate and circumflex in-stent restenosis on 12/21/2011 with 2.5 x 10 mm angiosculpt balloon.  H/O Prox/Mid Circumflex 2.5x25 Promus , ramus intermediate 2.5x26 Resolute DES stent placed on 07/19/11. 3. Diabetes mellitus type 2 controlled 4. Hypertension 5. Hyperlipidemia 6. Anemia. Hb stable.  [name], M.D. 02/19/2012, 12:01 PM [facility] Pager: [phone] Office: [phone] If no answer: [phone]

## 2012-02-19 NOTE — Consult Note (Signed)
ANTICOAGULATION CONSULT NOTE - Follow Up Consult  Pharmacy Consult for Heparin and Integrilin  Indication: chest pain/ACS  Allergies  Allergen Reactions  . Aspirin Other (See Comments)    Intolerant to high doses   Patient Measurements: Height: 5\' 7"  (170.2 cm) Weight: 184 lb 15.5 oz (83.9 kg) IBW/kg (Calculated) : 61.6  Heparin Dosing Weight: 80 kg  Vital Signs: Temp: 98.2 F (36.8 C) (12/14 2319) Temp src: Oral (12/14 2319) BP: 135/57 mmHg (12/14 2319) Pulse Rate: 84  (12/14 2319)  Labs:  Basename 02/19/12 0101 02/18/12 2336 02/18/12 1653 02/18/12 1513 02/18/12 1130 02/18/12 0624 02/17/12 2324 02/17/12 2323 02/17/12 1855  HGB 9.4* -- -- 10.0* -- -- -- -- --  HCT 28.2* -- -- 30.5* -- 30.9* -- -- --  PLT 215 -- -- 219 -- 232 -- -- --  APTT -- -- -- -- -- -- 143* -- --  LABPROT -- -- -- -- -- -- 14.6 -- --  INR -- -- -- -- -- -- 1.16 -- --  HEPARINUNFRC 0.21* -- -- 0.30 -- 0.18* -- -- --  CREATININE -- -- -- -- -- 1.17* 1.32* -- 1.46*  CKTOTAL -- 84 99 -- 113 -- -- -- --  CKMB -- 3.3 4.1* -- 4.6* -- -- -- --  TROPONINI -- -- -- -- 1.86* 1.51* -- 1.68* --    Estimated Creatinine Clearance: 44.1 ml/min (by C-G formula based on Cr of 1.17).   Medical History: Past Medical History  Diagnosis Date  . GERD (gastroesophageal reflux disease)   . PUD (peptic ulcer disease)   . Anemia   . Hypercholesterolemia   . Hypertension   . S/P endoscopy Aug 2011    3 superficial gastric ulcers, NSAID-induced  . S/P colonoscopy Sept 2011    left-sided diverticula, tubular adenoma  . Coronary artery disease   . Shortness of breath   . Diabetes mellitus     insulin dependent  . Headache     rare migraines  . Cancer     hx of skin cancer  . Arthritis     Medications:  No anticoagulants pta  Assessment: 78 yo female with history of CAD s/p stents on IV heparin and Integrilin for ACS. Plan for cath on Monday.   Heparin level (0.21) is now below-goal on 1350 units/hr. No  problem with line / infusion per RN and no signs/symptoms of bleeding per RN.   Second bottle of Integrilin was completed at ~ 2:20 AM. Will discontinue as per Dr. Jacinto Halim order modification.   Goal of Therapy:  Heparin level 0.3 - 0.5 (while on Integrilin) Monitor platelets by anticoagulation protocol: Yes   Plan:  1. Heparin IV bolus of 1500 units x 1, then increase IV infusion to 1550 units/hr.  2. Heparin level in 8 hours (expand goal heparin level range to 0.3-0.7)  Lorre Munroe, PharmD 02/19/2012 2:32 AM

## 2012-02-19 NOTE — Progress Notes (Signed)
ANTICOAGULATION CONSULT NOTE - Follow Up Consult  Pharmacy Consult for Heparin and Integrilin  Indication: chest pain/ACS  Allergies  Allergen Reactions  . Aspirin Other (See Comments)    Intolerant to high doses   Patient Measurements: Height: 5\' 7"  (170.2 cm) Weight: 184 lb 15.5 oz (83.9 kg) IBW/kg (Calculated) : 61.6  Heparin Dosing Weight: 80 kg  Vital Signs: Temp: 97.3 F (36.3 C) (12/15 1222) Temp src: Oral (12/15 1222) BP: 136/43 mmHg (12/15 1222) Pulse Rate: 83  (12/15 1000)  Labs:  Basename 02/19/12 1141 02/19/12 1136 02/19/12 0517 02/19/12 0101 02/18/12 2336 02/18/12 1513 02/18/12 1130 02/18/12 0624 02/17/12 2324 02/17/12 2323 02/17/12 1855  HGB -- -- -- 9.4* -- 10.0* -- -- -- -- --  HCT -- -- -- 28.2* -- 30.5* -- 30.9* -- -- --  PLT -- -- -- 215 -- 219 -- 232 -- -- --  APTT -- -- -- -- -- -- -- -- 143* -- --  LABPROT -- -- -- -- -- -- -- -- 14.6 -- --  INR -- -- -- -- -- -- -- -- 1.16 -- --  HEPARINUNFRC -- 0.58 -- 0.21* -- 0.30 -- -- -- -- --  CREATININE -- -- -- -- -- -- -- 1.17* 1.32* -- 1.46*  CKTOTAL 87 -- 79 -- 84 -- -- -- -- -- --  CKMB 3.3 -- 2.9 -- 3.3 -- -- -- -- -- --  TROPONINI -- -- -- -- -- -- 1.86* 1.51* -- 1.68* --    Estimated Creatinine Clearance: 44.1 ml/min (by C-G formula based on Cr of 1.17).   Medical History: Past Medical History  Diagnosis Date  . GERD (gastroesophageal reflux disease)   . PUD (peptic ulcer disease)   . Anemia   . Hypercholesterolemia   . Hypertension   . S/P endoscopy Aug 2011    3 superficial gastric ulcers, NSAID-induced  . S/P colonoscopy Sept 2011    left-sided diverticula, tubular adenoma  . Coronary artery disease   . Shortness of breath   . Diabetes mellitus     insulin dependent  . Headache     rare migraines  . Cancer     hx of skin cancer  . Arthritis        . sodium chloride    . heparin 1,550 Units/hr (02/19/12 0237)  . nitroGLYCERIN 30 mcg/min (02/19/12 0857)  . [DISCONTINUED]  eptifibatide Stopped (02/19/12 0220)    Assessment: 78 yo female with history of CAD s/p stents on IV heparin for ACS.  Integrilin was completed at 0200 12/15.  Plan for cath on Monday.   Heparin level is therapeutic on 1550 units/hr. No problem with line / infusion per RN and no signs/symptoms of bleeding per RN.   Goal of Therapy:  Heparin level 0.3-0.7 Monitor platelets by anticoagulation protocol: Yes   Plan:  Continue Heparin at 1550 units/hr Check AM Heparin level and CBC  Estella Husk, Pharm.D., BCPS Clinical Pharmacist  Phone [phone] Pager [phone] 02/19/2012, 1:50 PM

## 2012-02-20 ENCOUNTER — Encounter (HOSPITAL_COMMUNITY)
Admission: EM | Disposition: A | Discharge status: Home / Self Care | Payer: Self-pay | Source: Home / Self Care | Attending: Cardiology

## 2012-02-20 HISTORY — PX: LEFT HEART CATHETERIZATION WITH CORONARY ANGIOGRAM: SHX5451

## 2012-02-20 LAB — GLUCOSE, CAPILLARY: Glucose-Capillary: 128 mg/dL — ABNORMAL HIGH (ref 70–99)

## 2012-02-20 LAB — CK TOTAL AND CKMB (NOT AT ARMC)
CK, MB: 2.6 ng/mL (ref 0.3–4.0)
CK, MB: 2.6 ng/mL (ref 0.3–4.0)
Relative Index: INVALID (ref 0.0–2.5)
Total CK: 64 U/L (ref 7–177)

## 2012-02-20 LAB — CBC
HCT: 29.2 % — ABNORMAL LOW (ref 36.0–46.0)
Hemoglobin: 9.6 g/dL — ABNORMAL LOW (ref 12.0–15.0)
MCV: 90.1 fL (ref 78.0–100.0)
RBC: 3.24 MIL/uL — ABNORMAL LOW (ref 3.87–5.11)
WBC: 7.1 10*3/uL (ref 4.0–10.5)

## 2012-02-20 SURGERY — LEFT HEART CATHETERIZATION WITH CORONARY ANGIOGRAM
Anesthesia: LOCAL

## 2012-02-20 MED ORDER — LIDOCAINE HCL (PF) 1 % IJ SOLN
INTRAMUSCULAR | Status: AC
  Filled 2012-02-20: qty 30

## 2012-02-20 MED ORDER — PRASUGREL HCL 10 MG PO TABS
ORAL_TABLET | ORAL | Status: AC
  Administered 2012-02-21: 10 mg via ORAL
  Filled 2012-02-20: qty 1

## 2012-02-20 MED ORDER — ASPIRIN 81 MG PO CHEW
81.0000 mg | CHEWABLE_TABLET | Freq: Every day | ORAL | Status: DC
  Administered 2012-02-21: 81 mg via ORAL
  Filled 2012-02-20: qty 1

## 2012-02-20 MED ORDER — ONDANSETRON HCL 4 MG/2ML IJ SOLN
4.0000 mg | Freq: Four times a day (QID) | INTRAMUSCULAR | Status: DC | PRN
  Administered 2012-02-20: 15:00:00 4 mg via INTRAVENOUS
  Filled 2012-02-20: qty 2

## 2012-02-20 MED ORDER — MIDAZOLAM HCL 2 MG/2ML IJ SOLN
INTRAMUSCULAR | Status: AC
  Filled 2012-02-20: qty 2

## 2012-02-20 MED ORDER — BIVALIRUDIN 250 MG IV SOLR
INTRAVENOUS | Status: AC
  Filled 2012-02-20: qty 250

## 2012-02-20 MED ORDER — HEPARIN (PORCINE) IN NACL 2-0.9 UNIT/ML-% IJ SOLN
INTRAMUSCULAR | Status: AC
  Filled 2012-02-20: qty 1000

## 2012-02-20 MED ORDER — ASPIRIN 81 MG PO CHEW
CHEWABLE_TABLET | ORAL | Status: AC
  Administered 2012-02-21: 81 mg via ORAL
  Filled 2012-02-20: qty 4

## 2012-02-20 MED ORDER — HYDROMORPHONE HCL PF 2 MG/ML IJ SOLN
INTRAMUSCULAR | Status: AC
  Filled 2012-02-20: qty 1

## 2012-02-20 MED ORDER — SODIUM CHLORIDE 0.9 % IV SOLN
0.2500 mg/kg/h | INTRAVENOUS | Status: DC
  Filled 2012-02-20 (×2): qty 250

## 2012-02-20 MED ORDER — NITROGLYCERIN 0.2 MG/ML ON CALL CATH LAB
INTRAVENOUS | Status: AC
  Filled 2012-02-20: qty 1

## 2012-02-20 MED ORDER — SODIUM CHLORIDE 0.9 % IV SOLN
INTRAVENOUS | Status: AC
  Administered 2012-02-20: 12:00:00 via INTRAVENOUS

## 2012-02-20 MED ORDER — ACETAMINOPHEN 325 MG PO TABS: 650.0000 mg | ORAL_TABLET | ORAL | Status: DC | PRN

## 2012-02-20 NOTE — CV Procedure (Signed)
Procedure performed:  Left heart catheterization selective right and left coronary arteriography. PTCA and stenting  of the Circumflex instent restenosis and Ramus intermediate artery. .  Indication: Patient is a 78 year-old female with history of hypertension,  hyperlipidemia,  Diabetes Mellitus   who presents with NSTEMI.  Patient has PTCA and scoring balloon PTCA of the Ramus intermediate and circumflex in-stent restenosis on 12/21/2011 with 2.5 x 10 mm angiosculpt balloon.  H/O Prox/Mid Circumflex 2.5x25 Promus , ramus intermediate 2.5x26 Resolute DES stent placed on 07/19/11.  Interventional data successful PTCA and stenting of the circumflex coronary artery mid and proximal segment with implantation of a 2.5 x 32 mm Promus premier drug-eluting stent for recurrent in-stent restenosis of previously placed stent. Successful PTCA and stenting of the ramus intermediate for both distal instent restenosis and a new lesion in the outflow of the stent with implantation of a 2.5 x 16 mm Promus premier drug-eluting stent.  Hemodynamic data: Aortic pressure was 109/43 with a mean of 70 mm mercury.   Right coronary artery:  Occluded chronically and has collaterals from the left system.  Left main coronary artery is large with mild diffuse calcification.  Circumflex coronary artery: A large vessel giving origin to a small obtuse marginal 1. There is diffuse in-stent restenosis noted in the midsegment. Proximal circumflex coronary artery which is non-stented has a 80% stenosis.  LAD:  LAD gives origin to a large diagonal-1.  LAD has mild luminal irregularities.   Ramus intermediate: Large vessel with a in-stent 30-40% stenosis in the distal segment of the stent which was placed in the proximal and midsegment of the ramus intermediate. Outflow of the stent has a high-grade 99% stenosis.  Technique of diagnostic cardiac catheterization:  Under sterile precautions using a 6 French right radial  arterial  access, a 6 French sheath was introduced into the right radial artery. A 5 [place] Tig 4 catheter was advanced into the ascending aorta selective  right coronary artery and left coronary artery was cannulated and angiography was performed in multiple views. The catheter was pulled back Out of the body over exchange length J-wire. Catheter exchanged out of the body over J-Wire.   Technique of intervention:  Using a 6 [place] XB 3.0  guide catheter theLM  coronary  was selected and cannulated. Using Angiomax for anticoagulation, I utilized a BMW guidewire and across the circumflex coronary artery coronary artery without any difficulty. I placed the tip of the wire into the distal  coronary artery. Angiography was performed.   Then I utilized a 2.5 x 10 mm Angiosculpt balloon , I performed balloon angioplasty at 8-10 atmospheric pressure x 3 for 60 seconds each. I proceeded with implantation of a 2.5t x 32 mm Promus  drug-eluting stent into the proximal and midsegment of circumflex coronary artery. The stent was deployed at 10 atmospheric pressure for 45  seconds. The stent was then post dilated with a 2.5x20 mm  trek  Balloon x2 at 14 atmospheric pressure for 32 seconds. Post-balloon angioplasty results were excellent with 0% residual stenoses and TIMI-3 flow was maintained.  Attention was directed towards the ramus intermediate and in a similar fashion the wire was placed in the distal bed and I utilized the noncompliant balloon to perform angioplasty at 8 atmospheric pressure for 50 seconds. This was followed by implantation of a 2.5 x 16 mm Promus premier drug-eluting stent at 10 atmospheric pressure for 50 seconds followed by post dilatation with the noncompliant 2.5 x 20 mm  balloon at 14 and 16 atmospheric pressure for 30 seconds each followed by intracoronary nitroglycerin administration and angiography. Excellent results were evident.  There was no evidence of edge dissection. The guidewire was  withdrawn out of the body and the guide catheter was engaged and pulled out of the body over the J-wire the was no immediate complication. Patient tolerated the procedure well.  Disposition: Patient will be discharged in  am unless complications with out-patient follow up.

## 2012-02-20 NOTE — Interval H&P Note (Signed)
History and Physical Interval Note:  02/20/2012 9:01 AM  Claudia [name]  has presented today for surgery, with the diagnosis of cp  The various methods of treatment have been discussed with the patient and family. After consideration of risks, benefits and other options for treatment, the patient has consented to  Procedure(s) (LRB) with comments: LEFT HEART CATHETERIZATION WITH CORONARY ANGIOGRAM (N/A) and possible angioplasty as a surgical intervention .  The patient's history has been reviewed, patient examined, no change in status, stable for surgery.  I have reviewed the patient's chart and labs.  Questions were answered to the patient's satisfaction.     [name]

## 2012-02-20 NOTE — H&P (View-Only) (Signed)
Subjective:  Patient is presently asymptomatic with no recurrence of chest pain. She denies any shortness of breath. She states that she slept well.  Objective:  Vital Signs in the last 24 hours: Temp:  [97.6 F (36.4 C)-98.7 F (37.1 C)] 97.8 F (36.6 C) (12/15 0800) Pulse Rate:  [60-104] 83  (12/15 1000) Resp:  [14-22] 14  (12/15 0800) BP: (116-157)/(38-62) 132/47 mmHg (12/15 1000) SpO2:  [92 %-99 %] 96 % (12/15 1000)  Intake/Output from previous day: 12/14 0701 - 12/15 0700 In: 2281.4 [P.O.:1640; I.V.:641.4] Out: 2950 [Urine:2950]  Physical Exam:   General appearance: alert, cooperative, appears stated age and no distress Eyes: conjunctivae/corneas clear. PERRL, EOM's intact. Fundi benign. Neck: no adenopathy, no carotid bruit, no JVD, supple, symmetrical, trachea midline and thyroid not enlarged, symmetric, no tenderness/mass/nodules Neck: JVP - normal, carotids 2+= without bruits Resp: clear to auscultation bilaterally Chest wall: no tenderness Cardio: regular rate and rhythm, S1, S2 normal, no murmur, click, rub or gallop GI: soft, non-tender; bowel sounds normal; no masses,  no organomegaly Extremities: extremities normal, atraumatic, no cyanosis or edema    Lab Results:  Basename 02/19/12 0101 02/18/12 1513  WBC 6.2 6.0  HGB 9.4* 10.0*  PLT 215 219    Basename 02/18/12 0624 02/17/12 2324  NA 140 139  K 3.9 3.9  CL 108 106  CO2 23 23  GLUCOSE 82 132*  BUN 20 25*  CREATININE 1.17* 1.32*    Basename 02/18/12 1130 02/18/12 0624  TROPONINI 1.86* 1.51*   Hepatic Function Panel  Basename 02/17/12 2324  PROT 6.5  ALBUMIN 3.3*  AST 25  ALT 13  ALKPHOS 92  BILITOT 0.4  BILIDIR --  IBILI --    Basename 02/18/12 0624  CHOL 131   No results found for this basename: PROTIME in the last 72 hours Lipid Panel     Component Value Date/Time   CHOL 131 02/18/2012 0624   TRIG 42 02/18/2012 0624   HDL 72 02/18/2012 0624   CHOLHDL 1.8 02/18/2012 0624   VLDL 8 02/18/2012 0624   LDLCALC 51 02/18/2012 0624    Cardiac Studies:  EKG: 02/19/12: Normal sinus rhythm, heart rate 72 beats a minute, the abnormality, LVH, lateral ST segment depression with T wave inversion suggestive of subendocardial infarct versus ischemia. Compared to yesterday, lateral ST depression slightly improved.  Assessment/Plan:  1. Non-ST elevation myocardial infarction. EKG today the lateral ST depression slightly improved. No further chest pain. CKMB flat and trending down. For cath tomorrow.  2. CAD status post PTCA and stenting. History of S/P PTCA and scoring balloon PTCA of the Ramus intermediate and circumflex in-stent restenosis on 12/21/2011 with 2.5 x 10 mm angiosculpt balloon.  H/O Prox/Mid Circumflex 2.5x25 Promus , ramus intermediate 2.5x26 Resolute DES stent placed on 07/19/11. 3. Diabetes mellitus type 2 controlled 4. Hypertension 5. Hyperlipidemia 6. Anemia. Hb stable.  Renezmae Canlas,JAGADEESH R, M.D. 02/19/2012, 12:01 PM [facility] Pager: [phone] Office: [phone] If no answer: [phone]  

## 2012-02-20 NOTE — Progress Notes (Signed)
TR BAND REMOVAL  LOCATION:    right radial  DEFLATED PER PROTOCOL:    yes  TIME BAND OFF / DRESSING APPLIED:    1310   SITE UPON ARRIVAL:    Level 0  SITE AFTER BAND REMOVAL:    Level 0  REVERSE ALLEN'S TEST:     positive  CIRCULATION SENSATION AND MOVEMENT:    Within Normal Limits   yes  COMMENTS:   No problems with band deflation

## 2012-02-20 NOTE — Care Management Note (Signed)
    Page 1 of 1   02/20/2012     8:33:40 AM   CARE MANAGEMENT NOTE 02/20/2012  Patient:  Claudia [name], Claudia [name]   Account Number:  [id number]  Date Initiated:  02/20/2012  Documentation initiated by:  Junius Creamer  Subjective/Objective Assessment:   adm w mi     Action/Plan:   lives w fam, pcp dr Broadus John pickard   Anticipated DC Date:     Anticipated DC Plan:  HOME/SELF CARE      DC Planning Services  CM consult      Choice offered to / List presented to:             Status of service:   Medicare Important Message given?   (If response is "NO", the following Medicare IM given date fields will be blank) Date Medicare IM given:   Date Additional Medicare IM given:    Discharge Disposition:    Per UR Regulation:  Reviewed for med. necessity/level of care/duration of stay  If discussed at Long Length of Stay Meetings, dates discussed:    Comments:  12/16 8:33a Claudia Aadit Hagood rn,bns [phone]

## 2012-02-20 NOTE — Progress Notes (Signed)
Hep gtt stopped at this time; cath lab staff here to take pt to cath lab.

## 2012-02-21 LAB — BASIC METABOLIC PANEL
BUN: 21 mg/dL (ref 6–23)
CO2: 24 mEq/L (ref 19–32)
Calcium: 9.1 mg/dL (ref 8.4–10.5)
GFR calc non Af Amer: 39 mL/min — ABNORMAL LOW (ref >90)
Glucose, Bld: 185 mg/dL — ABNORMAL HIGH (ref 70–99)

## 2012-02-21 LAB — GLUCOSE, CAPILLARY: Glucose-Capillary: 104 mg/dL — ABNORMAL HIGH (ref 70–99)

## 2012-02-21 LAB — CBC
HCT: 31.1 % — ABNORMAL LOW (ref 36.0–46.0)
Hemoglobin: 10.1 g/dL — ABNORMAL LOW (ref 12.0–15.0)
MCH: 30.1 pg (ref 26.0–34.0)
MCHC: 32.5 g/dL (ref 30.0–36.0)
RBC: 3.35 MIL/uL — ABNORMAL LOW (ref 3.87–5.11)

## 2012-02-21 MED FILL — Dextrose Inj 5%: INTRAVENOUS | Qty: 50 | Status: AC

## 2012-02-21 NOTE — Progress Notes (Signed)
CARDIAC REHAB PHASE I   PRE:  Rate/Rhythm: 65 SR  BP:  Supine: 117/63  Sitting:   Standing:    SaO2: 97 RA  MODE:  Ambulation: 400 ft   POST:  Rate/Rhythem: 88 SR  BP:  Supine:   Sitting: 131/39  Standing:    SaO2:  0815-0900 Assisted X 1 to ambulate. Pt's gait wobbly states that she has been having some problems with this prior to coming to hospital. Pt able to walk 400 feet without c/o of cp or SOB. VS stable. Completed MI and stent education with pt. She voices understanding. Pt declines Outpt. CRP she just finished program. Pt is interested in Outpt. diabetic education with pt encouraged her to discuss this with her primary care MD.  Beatrix Fetters

## 2012-02-21 NOTE — Discharge Summary (Signed)
Physician Discharge Summary  Patient ID: Claudia [name] MRN: [id number] DOB/AGE: Nov 03, 1931 78 y.o.  Admit date: 02/17/2012 Discharge date: 02/21/2012  Primary Discharge Diagnosis non-ST elevation myocardial infarction involving inferior and lateral wall  Secondary Discharge Diagnosis Diabetes mellitus type 2 uncontrolled with a hemoglobin A1c of 8.1% as of 02/17/2012 Hypertension Hyperlipidemia: Lipid profile 14 2013: Total cholesterol 131, triglycerides 42, HDL 72, LDL 51.  Significant Diagnostic Studies: PTCA and scoring balloon PTCA of the Ramus intermediate and circumflex in-stent restenosis on 12/21/2011 with 2.5 x 10 mm angiosculpt balloon. H/O Prox/Mid Circumflex 2.5x25 Promus , ramus intermediate 2.5x26 Resolute DES stent placed on 07/19/11.  Hospital Course: Patient was admitted to the hospital on December 13th 2013 with non-ST elevation myocardial infarction, she responded to IV heparin and Integrilin. She ruled in for myocardial infarction and underwent cardiac catheterization the following Monday on 12/21/2011 and underwent successful angioplasty for recurrent in-stent restenosis by sandwich stenting of circumflex coronary artery and ramus intermediate coronary artery bypass of drug-eluting stents. Patient's EKG improved on the day of discharge and is back to baseline without any ST-T wave changes of ischemia. Patient remained asymptomatic and was felt to be stable for discharge.  Recommendations on discharge: Aggressive control of diabetes, cardiac rehabilitation, continue present medications.  Discharge Exam: Blood pressure 117/43, pulse 58, temperature 97.7 F (36.5 C), temperature source Oral, resp. rate 11, height 5\' 7"  (1.702 m), weight 85.1 kg (187 lb 9.8 oz), SpO2 97.00%.   General appearance: alert, cooperative, appears stated age and no distress  Eyes: conjunctivae/corneas clear. PERRL, EOM's intact. Fundi benign.  Neck: no adenopathy, no carotid bruit, no JVD,  supple, symmetrical, trachea midline and thyroid not enlarged, symmetric, no tenderness/mass/nodules  Neck: JVP - normal, carotids 2+= without bruits  Resp: clear to auscultation bilaterally  Chest wall: no tenderness  Cardio: regular rate and rhythm, S1, S2 normal, no murmur, click, rub or gallop  GI: soft, non-tender; bowel sounds normal; no masses, no organomegaly  Extremities: extremities normal, atraumatic, no cyanosis or edema Right radial arterial access site is healed well with good pulse   Labs:   Lab Results  Component Value Date   WBC 7.0 02/21/2012   HGB 10.1* 02/21/2012   HCT 31.1* 02/21/2012   MCV 92.8 02/21/2012   PLT 209 02/21/2012    Lab 02/21/12 0430 02/17/12 2324  NA 139 --  K 4.7 --  CL 105 --  CO2 24 --  BUN 21 --  CREATININE 1.27* --  CALCIUM 9.1 --  PROT -- 6.5  BILITOT -- 0.4  ALKPHOS -- 92  ALT -- 13  AST -- 25  GLUCOSE 185* --   Lab Results  Component Value Date   CKTOTAL 65 02/20/2012   CKMB 2.6 02/20/2012   TROPONINI 1.86* 02/18/2012    Lipid Panel     Component Value Date/Time   CHOL 131 02/18/2012 0624   TRIG 42 02/18/2012 0624   HDL 72 02/18/2012 0624   CHOLHDL 1.8 02/18/2012 0624   VLDL 8 02/18/2012 0624   LDLCALC 51 02/18/2012 0624     EKG: normal sinus rhythm, normal axis, left hypertrophy, poor R-wave progression cannot exclude old anterior infarct, no evidence of ischemia. Previously noted ST segment depression and T wave inversion in inferior and lateral leads is back to baseline.   Radiology: Dg Chest 2 View  02/17/2012  *RADIOLOGY REPORT*  Clinical Data: Chest pain and shortness of breath.  Cough.  CHEST - 2 VIEW  Comparison: None.  Findings:  There is no acute infiltrate or effusions.  Heart size is normal.  Slight atelectasis or scarring at the right lung base posteriorly.  No acute osseous abnormality.  IMPRESSION: Slight scarring or atelectasis at the right lung base.   Original Report Authenticated By: Francene Boyers,  M.D.       FOLLOW UP PLANS AND APPOINTMENTS    Medication List     As of 02/21/2012  9:05 AM    TAKE these medications         acetaminophen 500 MG tablet   Commonly known as: TYLENOL   Take 500 mg by mouth 2 (two) times daily. For pain      aspirin 81 MG chewable tablet   Chew 81 mg by mouth daily.      carvedilol 6.25 MG tablet   Commonly known as: COREG   Take 6.25 mg by mouth 2 (two) times daily with a meal.      cilostazol 50 MG tablet   Commonly known as: PLETAL   Take 50 mg by mouth at bedtime.      ciprofloxacin 500 MG tablet   Commonly known as: CIPRO   Take 500 mg by mouth 2 (two) times daily. For 7 days. Started 12.6.13 ending 12.13.13      cyclobenzaprine 10 MG tablet   Commonly known as: FLEXERIL   Take 10 mg by mouth every 8 (eight) hours as needed. For muscle spasms      Fish Oil 1200 MG Caps   Take 1 capsule by mouth 2 (two) times daily.      insulin lispro 100 UNIT/ML injection   Commonly known as: HUMALOG   Inject 7-10 Units into the skin 3 (three) times daily before meals.      LANTUS SOLOSTAR 100 UNIT/ML injection   Generic drug: insulin glargine   Inject 20 Units into the skin at bedtime.      lisinopril 20 MG tablet   Commonly known as: PRINIVIL,ZESTRIL   Take 20 mg by mouth daily.      omeprazole 20 MG capsule   Commonly known as: PRILOSEC   Take 20 mg by mouth daily.      prasugrel 10 MG Tabs   Commonly known as: EFFIENT   Take 1 tablet (10 mg total) by mouth daily.      simvastatin 20 MG tablet   Commonly known as: ZOCOR   Take 20 mg by mouth every evening.           Follow-up Information    Follow up with [name], MD. On 03/02/2012. (12:15 pm. Arrive 15 minutes prior)    Contact information:   [address] [place] [address] [phone]           [name], MD 02/21/2012, 9:05 AM  Pager: [phone] Office: [phone] If no answer: [phone]

## 2012-04-21 ENCOUNTER — Other Ambulatory Visit: Payer: Self-pay

## 2012-05-03 ENCOUNTER — Encounter: Payer: Self-pay | Admitting: *Deleted

## 2012-05-03 DIAGNOSIS — E7800 Pure hypercholesterolemia, unspecified: Secondary | ICD-10-CM

## 2012-05-03 DIAGNOSIS — M8580 Other specified disorders of bone density and structure, unspecified site: Secondary | ICD-10-CM

## 2012-05-25 ENCOUNTER — Encounter: Payer: Self-pay | Admitting: Family Medicine

## 2012-05-25 ENCOUNTER — Ambulatory Visit (INDEPENDENT_AMBULATORY_CARE_PROVIDER_SITE_OTHER): Payer: Medicare PPO | Admitting: Family Medicine

## 2012-05-25 VITALS — BP 120/50 | HR 66 | Temp 98.0°F | Resp 18 | Wt 207.0 lb

## 2012-05-25 DIAGNOSIS — R05 Cough: Secondary | ICD-10-CM

## 2012-05-25 DIAGNOSIS — I25810 Atherosclerosis of coronary artery bypass graft(s) without angina pectoris: Secondary | ICD-10-CM

## 2012-05-25 DIAGNOSIS — R609 Edema, unspecified: Secondary | ICD-10-CM

## 2012-05-25 DIAGNOSIS — R600 Localized edema: Secondary | ICD-10-CM

## 2012-05-25 MED ORDER — FUROSEMIDE 40 MG PO TABS: 40.0000 mg | ORAL_TABLET | Freq: Every day | ORAL | Status: DC

## 2012-05-25 NOTE — Progress Notes (Signed)
Subjective:     Patient ID: Claudia [name], female   DOB: 1931/06/10, 79 y.o.   MRN: [id number]  HPI Very pleasant 79 year old white female who presents with cough for one-week.  He is concerned as she's gotten a cold her son has.  He denies fever rhinorrhea sore throat sneezing or myalgias.  However her weights at 15 pounds from her last office.  She has +2 edema to the knees and both legs.  She denies orthopnea paroxysmal dyspnea or dyspnea on exertion beyond her baseline.  She denies pleurisy or chest pain. Past Medical History  Diagnosis Date  . GERD (gastroesophageal reflux disease)   . PUD (peptic ulcer disease)   . Anemia   . Hypertension   . S/P endoscopy Aug 2011    3 superficial gastric ulcers, NSAID-induced  . S/P colonoscopy Sept 2011    left-sided diverticula, tubular adenoma  . Coronary artery disease   . Shortness of breath   . Diabetes mellitus     insulin dependent  . Headache     rare migraines  . Cancer     hx of skin cancer  . Arthritis   . Osteopenia   . Hypercholesterolemia   . SIADH (syndrome of inappropriate ADH production)    Current Outpatient Prescriptions on File Prior to Visit  Medication Sig Dispense Refill  . acetaminophen (TYLENOL) 500 MG tablet Take 500 mg by mouth 2 (two) times daily. For pain      . carvedilol (COREG) 6.25 MG tablet Take 6.25 mg by mouth 2 (two) times daily with a meal.       . insulin glargine (LANTUS SOLOSTAR) 100 UNIT/ML injection Inject 20 Units into the skin at bedtime.      . insulin lispro (HUMALOG) 100 UNIT/ML injection Inject 7-10 Units into the skin 3 (three) times daily before meals.       Marland Kitchen losartan (COZAAR) 50 MG tablet Take 50 mg by mouth daily.      . meclizine (ANTIVERT) 25 MG tablet Take 25 mg by mouth every 4 (four) hours as needed.      . nitroGLYCERIN (NITROSTAT) 0.4 MG SL tablet Place 0.4 mg under the tongue every 5 (five) minutes as needed for chest pain.      . Omega-3 Fatty Acids (FISH OIL) 1200 MG CAPS  Take 1 capsule by mouth 2 (two) times daily.       Marland Kitchen omeprazole (PRILOSEC) 20 MG capsule Take 20 mg by mouth 2 (two) times daily.       . prasugrel (EFFIENT) 10 MG TABS Take 1 tablet (10 mg total) by mouth daily.  30 tablet  0  . simvastatin (ZOCOR) 20 MG tablet Take 20 mg by mouth every evening.       No current facility-administered medications on file prior to visit.     Review of Systems  Constitutional: Negative for fever, chills, activity change, appetite change and fatigue.  HENT: Negative for ear pain, congestion, rhinorrhea, sneezing, neck pain and postnasal drip.   Respiratory: Positive for cough. Negative for chest tightness.   Cardiovascular: Positive for leg swelling. Negative for chest pain and palpitations.       Objective:   Physical Exam  Constitutional: She appears well-developed and well-nourished.  HENT:  Head: Normocephalic and atraumatic.  Right Ear: External ear normal.  Left Ear: External ear normal.  Mouth/Throat: Oropharynx is clear and moist.  Eyes: Conjunctivae and EOM are normal. Pupils are equal, round, and reactive to light.  Neck: Normal range of motion. Neck supple. No thyromegaly present.  Cardiovascular: Normal rate, regular rhythm and normal heart sounds.   Pulmonary/Chest: Effort normal. She has rales (right basilar rales that clear with coughing. ).  Abdominal: Soft. Bowel sounds are normal.  Skin: Skin is warm and dry.   she has +2 edema to both knee.     Assessment:     cough Edema Coronary artery disease    Plan:     Begin Lasix 40 mg by mouth daily.  She is to take the pill every day over the weekend call me Monday to let me know how it's working.  If the cough persists he may need a chest x-ray to differentiate atelectasis versus pulmonary edema.  She is to call me immediately if she develops fevers or chills or other signs of infection. If the edema is better by Monday we will decrease Lasix to when necessary.

## 2012-05-28 ENCOUNTER — Telehealth: Payer: Self-pay | Admitting: Family Medicine

## 2012-05-28 NOTE — Telephone Encounter (Signed)
Pt aware.

## 2012-05-28 NOTE — Telephone Encounter (Signed)
Great, stop lasix for now and use only PRN for leg swelling.

## 2012-06-01 ENCOUNTER — Emergency Department (HOSPITAL_COMMUNITY)
Admission: EM | Admit: 2012-06-01 | Discharge: 2012-06-01 | Disposition: A | Discharge status: Home / Self Care | Payer: Medicare HMO | Attending: Emergency Medicine | Admitting: Emergency Medicine

## 2012-06-01 ENCOUNTER — Encounter (HOSPITAL_COMMUNITY): Payer: Self-pay

## 2012-06-01 ENCOUNTER — Emergency Department (HOSPITAL_COMMUNITY): Payer: Medicare HMO

## 2012-06-01 DIAGNOSIS — I2510 Atherosclerotic heart disease of native coronary artery without angina pectoris: Secondary | ICD-10-CM | POA: Insufficient documentation

## 2012-06-01 DIAGNOSIS — K279 Peptic ulcer, site unspecified, unspecified as acute or chronic, without hemorrhage or perforation: Secondary | ICD-10-CM | POA: Insufficient documentation

## 2012-06-01 DIAGNOSIS — I10 Essential (primary) hypertension: Secondary | ICD-10-CM | POA: Insufficient documentation

## 2012-06-01 DIAGNOSIS — Z7982 Long term (current) use of aspirin: Secondary | ICD-10-CM | POA: Insufficient documentation

## 2012-06-01 DIAGNOSIS — Z79899 Other long term (current) drug therapy: Secondary | ICD-10-CM | POA: Insufficient documentation

## 2012-06-01 DIAGNOSIS — K219 Gastro-esophageal reflux disease without esophagitis: Secondary | ICD-10-CM | POA: Insufficient documentation

## 2012-06-01 DIAGNOSIS — Z8679 Personal history of other diseases of the circulatory system: Secondary | ICD-10-CM | POA: Insufficient documentation

## 2012-06-01 DIAGNOSIS — N289 Disorder of kidney and ureter, unspecified: Secondary | ICD-10-CM

## 2012-06-01 DIAGNOSIS — E7800 Pure hypercholesterolemia, unspecified: Secondary | ICD-10-CM | POA: Insufficient documentation

## 2012-06-01 DIAGNOSIS — R0602 Shortness of breath: Secondary | ICD-10-CM | POA: Insufficient documentation

## 2012-06-01 DIAGNOSIS — R079 Chest pain, unspecified: Secondary | ICD-10-CM | POA: Insufficient documentation

## 2012-06-01 LAB — CBC WITH DIFFERENTIAL/PLATELET
Basophils Relative: 1 % (ref 0–1)
Eosinophils Absolute: 0.2 10*3/uL (ref 0.0–0.7)
Eosinophils Relative: 3 % (ref 0–5)
Hemoglobin: 11.1 g/dL — ABNORMAL LOW (ref 12.0–15.0)
Lymphs Abs: 1.4 10*3/uL (ref 0.7–4.0)
MCH: 29.9 pg (ref 26.0–34.0)
MCHC: 32.8 g/dL (ref 30.0–36.0)
MCV: 91.1 fL (ref 78.0–100.0)
Monocytes Relative: 9 % (ref 3–12)
RBC: 3.71 MIL/uL — ABNORMAL LOW (ref 3.87–5.11)

## 2012-06-01 LAB — BASIC METABOLIC PANEL
BUN: 37 mg/dL — ABNORMAL HIGH (ref 6–23)
Calcium: 9.2 mg/dL (ref 8.4–10.5)
Creatinine, Ser: 1.31 mg/dL — ABNORMAL HIGH (ref 0.50–1.10)
GFR calc non Af Amer: 38 mL/min — ABNORMAL LOW (ref >90)
Glucose, Bld: 294 mg/dL — ABNORMAL HIGH (ref 70–99)

## 2012-06-01 MED ORDER — PANTOPRAZOLE SODIUM 40 MG PO TBEC
40.0000 mg | DELAYED_RELEASE_TABLET | Freq: Every day | ORAL | Status: DC
  Administered 2012-06-01: 40 mg via ORAL
  Filled 2012-06-01: qty 1

## 2012-06-01 NOTE — ED Notes (Signed)
Woke with sharp chest pain across mid chest. Took one nitro sl and had relief

## 2012-06-01 NOTE — ED Notes (Signed)
Up to bathroom with assist,tolerated well.

## 2012-06-01 NOTE — ED Provider Notes (Signed)
History     CSN: [id number]  Arrival date & time 06/01/12  4098   First MD Initiated Contact with Patient 06/01/12 [phone]      Chief Complaint  Patient presents with  . Chest Pain    (Consider location/radiation/quality/duration/timing/severity/associated sxs/prior treatment) HPI Claudia [name] is a 79 y.o. female brought in by ambulance, who presents to the Emergency Department complaining of chest pain that woke her up from sleep. It was sharp pain that went across the chest from right to left. She took a NTG Sl with relief. She has a h/o GERD and is on prilosec.  PCP Dr. Tanya Nones Cardiologist Dr. Anselm Jungling  Past Medical History  Diagnosis Date  . GERD (gastroesophageal reflux disease)   . PUD (peptic ulcer disease)   . Anemia   . Hypertension   . S/P endoscopy Aug 2011    3 superficial gastric ulcers, NSAID-induced  . S/P colonoscopy Sept 2011    left-sided diverticula, tubular adenoma  . Coronary artery disease   . Shortness of breath   . Diabetes mellitus     insulin dependent  . Headache     rare migraines  . Cancer     hx of skin cancer  . Arthritis   . Osteopenia   . Hypercholesterolemia   . SIADH (syndrome of inappropriate ADH production)     Past Surgical History  Procedure Laterality Date  . Tonsillectomy    . Appendectomy    . Eye surgery      cataracts  . Cardiac stents  07/19/2011  . Cardiac catheterization  07/19/2011  . Esophagogastroduodenoscopy  10/16/09    normal without barrett's/three superficial gastric ulcers  . Colonoscopy  12/04/09    normal rectum/left-sided diverticula  . Joint replacement  arthroscopy to knee    History reviewed. No pertinent family history.  History  Substance Use Topics  . Smoking status: Never Smoker   . Smokeless tobacco: Never Used  . Alcohol Use: No    OB History   Grav Para Term Preterm Abortions TAB SAB Ect Mult Living                  Review of Systems  Constitutional: Negative for fever.       10  Systems reviewed and are negative for acute change except as noted in the HPI.  HENT: Negative for congestion.   Eyes: Negative for discharge and redness.  Respiratory: Negative for cough and shortness of breath.   Cardiovascular: Negative for chest pain.  Gastrointestinal: Negative for vomiting and abdominal pain.  Musculoskeletal: Negative for back pain.  Skin: Negative for rash.  Neurological: Negative for syncope, numbness and headaches.  Psychiatric/Behavioral:       No behavior change.    Allergies  Aspirin  Home Medications   Current Outpatient Rx  Name  Route  Sig  Dispense  Refill  . acetaminophen (TYLENOL) 500 MG tablet   Oral   Take 500 mg by mouth 2 (two) times daily. For pain         . aspirin EC 81 MG tablet   Oral   Take 81 mg by mouth daily.         . carvedilol (COREG) 6.25 MG tablet   Oral   Take 6.25 mg by mouth 2 (two) times daily with a meal.          . insulin glargine (LANTUS SOLOSTAR) 100 UNIT/ML injection   Subcutaneous   Inject 20 Units into the  skin at bedtime.         . insulin lispro (HUMALOG) 100 UNIT/ML injection   Subcutaneous   Inject 7-10 Units into the skin 3 (three) times daily before meals.          Marland Kitchen losartan (COZAAR) 50 MG tablet   Oral   Take 50 mg by mouth daily.         . nitroGLYCERIN (NITROSTAT) 0.4 MG SL tablet   Sublingual   Place 0.4 mg under the tongue every 5 (five) minutes as needed for chest pain.         . Omega-3 Fatty Acids (FISH OIL) 1200 MG CAPS   Oral   Take 1 capsule by mouth 2 (two) times daily.          Marland Kitchen omeprazole (PRILOSEC) 20 MG capsule   Oral   Take 20 mg by mouth 2 (two) times daily.          . simvastatin (ZOCOR) 20 MG tablet   Oral   Take 20 mg by mouth every evening.         . furosemide (LASIX) 40 MG tablet   Oral   Take 1 tablet (40 mg total) by mouth daily.   30 tablet   0   . meclizine (ANTIVERT) 25 MG tablet   Oral   Take 25 mg by mouth every 4 (four) hours  as needed.         . prasugrel (EFFIENT) 10 MG TABS   Oral   Take 1 tablet (10 mg total) by mouth daily.   30 tablet   0     BP 183/69  Pulse 89  Temp(Src) 97.8 F (36.6 C) (Oral)  Resp 15  Ht 5\' 7"  (1.702 m)  Wt 197 lb (89.359 kg)  BMI 30.85 kg/m2  SpO2 98%  Physical Exam  Nursing note and vitals reviewed. Constitutional:  Awake, alert, nontoxic appearance.  HENT:  Head: Atraumatic.  Eyes: Right eye exhibits no discharge. Left eye exhibits no discharge.  Neck: Neck supple.  Cardiovascular: Normal rate and intact distal pulses.   Pulmonary/Chest: Effort normal and breath sounds normal. She exhibits no tenderness.  Abdominal: Soft. Bowel sounds are normal. There is no tenderness. There is no rebound.  Musculoskeletal: She exhibits no tenderness.  Baseline ROM, no obvious new focal weakness.  Neurological:  Mental status and motor strength appears baseline for patient and situation.  Skin: No rash noted.  Psychiatric: She has a normal mood and affect.    ED Course  Procedures (including critical care time) Results for orders placed during the hospital encounter of 06/01/12  CBC WITH DIFFERENTIAL      Result Value Range   WBC 5.0  4.0 - 10.5 K/uL   RBC 3.71 (*) 3.87 - 5.11 MIL/uL   Hemoglobin 11.1 (*) 12.0 - 15.0 g/dL   HCT 65.7 (*) 84.6 - 96.2 %   MCV 91.1  78.0 - 100.0 fL   MCH 29.9  26.0 - 34.0 pg   MCHC 32.8  30.0 - 36.0 g/dL   RDW 95.2  84.1 - 32.4 %   Platelets 225  150 - 400 K/uL   Neutrophils Relative 59  43 - 77 %   Neutro Abs 2.9  1.7 - 7.7 K/uL   Lymphocytes Relative 28  12 - 46 %   Lymphs Abs 1.4  0.7 - 4.0 K/uL   Monocytes Relative 9  3 - 12 %   Monocytes Absolute 0.4  0.1 - 1.0 K/uL   Eosinophils Relative 3  0 - 5 %   Eosinophils Absolute 0.2  0.0 - 0.7 K/uL   Basophils Relative 1  0 - 1 %   Basophils Absolute 0.1  0.0 - 0.1 K/uL  BASIC METABOLIC PANEL      Result Value Range   Sodium 136  135 - 145 mEq/L   Potassium 4.7  3.5 - 5.1 mEq/L    Chloride 100  96 - 112 mEq/L   CO2 25  19 - 32 mEq/L   Glucose, Bld 294 (*) 70 - 99 mg/dL   BUN 37 (*) 6 - 23 mg/dL   Creatinine, Ser 2.95 (*) 0.50 - 1.10 mg/dL   Calcium 9.2  8.4 - 62.1 mg/dL   GFR calc non Af Amer 38 (*) >90 mL/min   GFR calc Af Amer 44 (*) >90 mL/min  TROPONIN I      Result Value Range   Troponin I <0.30  <0.30 ng/mL    Dg Chest Port 1 View  06/01/2012  *RADIOLOGY REPORT*  Clinical Data: Short of breath.  PORTABLE CHEST - 1 VIEW  Comparison: 02/17/2012.  Findings: Borderline heart size.  There is pulmonary vascular congestion which is new compared to prior.  Interstitial pulmonary edema is present at the bases with Kerley B lines.  No effusion. No focal consolidation.  IMPRESSION: Mild CHF.   Original Report Authenticated By: Andreas Newport, M.D.     Date: 06/01/2012  0435  Rate: 88  Rhythm: normal sinus rhythm  QRS Axis: normal  Intervals: normal  ST/T Wave abnormalities: nonspecific ST/T changes  Conduction Disutrbances:none  Narrative Interpretation:   Old EKG Reviewed: unchanged c/w 02/21/12      MDM  Patient with sharp stabbing pain to the chest that woke her up. EKG is unremarkable, troponin is negative, xray with mild CHF. Her renal insufficiency is slightly worse due to the recent lasix. Reviewed results with the patient and her son.  Pt feels improved after observation and/or treatment in ED.Pt stable in ED with no significant deterioration in condition.The patient appears reasonably screened and/or stabilized for discharge and I doubt any other medical condition or other [facility] requiring further screening, evaluation, or treatment in the ED at this time prior to discharge.  MDM Reviewed: nursing note and vitals Interpretation: labs, ECG and x-ray           Nicoletta Dress. Colon Branch, MD 06/01/12 [phone]

## 2012-06-25 ENCOUNTER — Ambulatory Visit (INDEPENDENT_AMBULATORY_CARE_PROVIDER_SITE_OTHER): Payer: Medicare PPO | Admitting: Family Medicine

## 2012-06-25 ENCOUNTER — Encounter: Payer: Self-pay | Admitting: Family Medicine

## 2012-06-25 VITALS — BP 110/58 | HR 62 | Temp 97.9°F | Resp 16 | Wt 208.0 lb

## 2012-06-25 DIAGNOSIS — H9193 Unspecified hearing loss, bilateral: Secondary | ICD-10-CM

## 2012-06-25 DIAGNOSIS — I2510 Atherosclerotic heart disease of native coronary artery without angina pectoris: Secondary | ICD-10-CM

## 2012-06-25 DIAGNOSIS — E119 Type 2 diabetes mellitus without complications: Secondary | ICD-10-CM

## 2012-06-25 DIAGNOSIS — H9190 Unspecified hearing loss, unspecified ear: Secondary | ICD-10-CM

## 2012-06-25 DIAGNOSIS — I10 Essential (primary) hypertension: Secondary | ICD-10-CM

## 2012-06-25 DIAGNOSIS — I509 Heart failure, unspecified: Secondary | ICD-10-CM | POA: Insufficient documentation

## 2012-06-25 MED ORDER — FUROSEMIDE 40 MG PO TABS: 20.0000 mg | ORAL_TABLET | Freq: Every day | ORAL | Status: DC | PRN

## 2012-06-25 MED ORDER — LOSARTAN POTASSIUM 50 MG PO TABS: 25.0000 mg | ORAL_TABLET | Freq: Every day | ORAL | Status: DC

## 2012-06-25 NOTE — Patient Instructions (Addendum)
1) Decrease losartan to 25 mg a day 2) Start taking furosemide 20 mg every day 3) add humalog 5 units with breakfast.

## 2012-06-25 NOTE — Progress Notes (Signed)
Subjective:    Patient ID: Claudia [name], female    DOB: Apr 21, 1931, 79 y.o.   MRN: [id number]  HPI  Problem #1 diabetes- patient is currently on Lantus 20 units subcutaneous each bedtime.  She is also using Humalog 10 units with lunch and supper.  Her fasting blood sugars range 70 to 130. However her sugars prior to lunch and prior to dinner are usually around 200.  She denies hypoglycemia.  She is here to check her hemoglobin A1c.  She reports increasing shortness of breath, dyspnea on exertion, orthopnea, and paroxysmal nocturnal dyspnea. Both legs are swollen and tight. She the hospital recently for chest pain, and a chest x-ray showed mild CHF. However she cannot tolerate Lasix 40 mg a day, she states the drops her blood pressure too much and she becomes orthostatic.  She also reports hearing loss in both ears. She's having a difficult time maintaining conversations with her family. She denies any ear pain. She denies any vertigo.  She is due to recheck her fasting lipid panel with regard to her coronary artery disease. She's currently taking Zocor 20 mg by mouth daily.  She denies any myalgia or right upper quadrant pain.  Her medication list, problem list are reviewed.  Past Medical History  Diagnosis Date  . GERD (gastroesophageal reflux disease)   . PUD (peptic ulcer disease)   . Anemia   . Hypertension   . S/P endoscopy Aug 2011    3 superficial gastric ulcers, NSAID-induced  . S/P colonoscopy Sept 2011    left-sided diverticula, tubular adenoma  . Coronary artery disease   . Shortness of breath   . Diabetes mellitus     insulin dependent  . Headache     rare migraines  . Cancer     hx of skin cancer  . Arthritis   . Osteopenia   . Hypercholesterolemia   . SIADH (syndrome of inappropriate ADH production)    Current Outpatient Prescriptions on File Prior to Visit  Medication Sig Dispense Refill  . acetaminophen (TYLENOL) 500 MG tablet Take 500 mg by mouth 2 (two)  times daily. For pain      . aspirin EC 81 MG tablet Take 81 mg by mouth daily.      . carvedilol (COREG) 6.25 MG tablet Take 6.25 mg by mouth 2 (two) times daily with a meal.       . insulin glargine (LANTUS SOLOSTAR) 100 UNIT/ML injection Inject 20 Units into the skin at bedtime.      . insulin lispro (HUMALOG) 100 UNIT/ML injection Inject 7-10 Units into the skin 3 (three) times daily before meals.       . meclizine (ANTIVERT) 25 MG tablet Take 25 mg by mouth every 4 (four) hours as needed.      . nitroGLYCERIN (NITROSTAT) 0.4 MG SL tablet Place 0.4 mg under the tongue every 5 (five) minutes as needed for chest pain.      . Omega-3 Fatty Acids (FISH OIL) 1200 MG CAPS Take 1 capsule by mouth 2 (two) times daily.       Marland Kitchen omeprazole (PRILOSEC) 20 MG capsule Take 20 mg by mouth 2 (two) times daily.       . prasugrel (EFFIENT) 10 MG TABS Take 1 tablet (10 mg total) by mouth daily.  30 tablet  0  . simvastatin (ZOCOR) 20 MG tablet Take 20 mg by mouth every evening.       No current facility-administered medications on file prior  to visit.   History   Social History  . Marital Status: Widowed    Spouse Name: N/A    Number of Children: N/A  . Years of Education: N/A   Occupational History  . Not on file.   Social History Main Topics  . Smoking status: Never Smoker   . Smokeless tobacco: Never Used  . Alcohol Use: No  . Drug Use: No  . Sexually Active: No   Other Topics Concern  . Not on file   Social History Narrative  . No narrative on file     Review of Systems     Objective:   Physical Exam  Constitutional: She appears well-developed and well-nourished.  HENT:  Head: Normocephalic.  Right Ear: Tympanic membrane, external ear and ear canal normal. Decreased hearing is noted.  Left Ear: Tympanic membrane, external ear and ear canal normal. Decreased hearing is noted.  Eyes: Conjunctivae are normal. Pupils are equal, round, and reactive to light.  Neck: Neck supple. No  JVD present. No thyromegaly present.  Cardiovascular: Normal rate, normal heart sounds and intact distal pulses.   No murmur heard. Pulmonary/Chest: Effort normal. She has rales (bibasilar crackles.).  Abdominal: Soft. Bowel sounds are normal. She exhibits no distension. There is no tenderness. There is no rebound.  Lymphadenopathy:    She has no cervical adenopathy.   She has +1 edema to both knees.       Assessment & Plan:  1. Hearing loss, bilateral Hearing screen today in the office shows significant bilateral hearing loss. The patient is only able to hear at 40 dB in both ears at any frequency.  We will consult an audiologist for possible hearing aid.  2. DM (diabetes mellitus), type 2 Hemoglobin A1c is less than 7. I will check her hemoglobin A1c today. If it is greater than 7, we'll have the patient to add Humalog 5 units with breakfast. - Basic Metabolic Panel - Hemoglobin A1c - Hepatic Function Panel  3. CAD (coronary artery disease), native coronary artery Patient is to return fasting for a repeat fasting lipid panel. Her goal LDL is less than 70 - Basic Metabolic Panel - Hepatic Function Panel - Lipid Panel; Future  4. Benign hypertension Patient needs diuresis. However her blood pressure will not allow that. Therefore I'm going to decrease her Voltaren 25 mg by mouth daily to allow me to add a daily diuretic. - Basic Metabolic Panel  5. CHF (congestive heart failure) Lasix 20 mg by mouth daily. Patient to come in Friday with an update to determine her respiratory status. Her goal dry weight should be less than 200. - Pro b natriuretic peptide

## 2012-06-26 LAB — PRO B NATRIURETIC PEPTIDE: Pro B Natriuretic peptide (BNP): 3844 pg/mL — ABNORMAL HIGH (ref <451)

## 2012-06-26 LAB — BASIC METABOLIC PANEL
BUN: 39 mg/dL — ABNORMAL HIGH (ref 6–23)
CO2: 24 mEq/L (ref 19–32)
Calcium: 8.8 mg/dL (ref 8.4–10.5)
Creat: 1.38 mg/dL — ABNORMAL HIGH (ref 0.50–1.10)
Glucose, Bld: 189 mg/dL — ABNORMAL HIGH (ref 70–99)

## 2012-06-26 LAB — HEPATIC FUNCTION PANEL
ALT: 16 U/L (ref 0–35)
AST: 19 U/L (ref 0–37)
Albumin: 4 g/dL (ref 3.5–5.2)
Bilirubin, Direct: 0.1 mg/dL (ref 0.0–0.3)
Total Protein: 6.2 g/dL (ref 6.0–8.3)

## 2012-07-02 ENCOUNTER — Other Ambulatory Visit (INDEPENDENT_AMBULATORY_CARE_PROVIDER_SITE_OTHER): Payer: Medicare PPO

## 2012-07-02 DIAGNOSIS — I2510 Atherosclerotic heart disease of native coronary artery without angina pectoris: Secondary | ICD-10-CM

## 2012-07-02 DIAGNOSIS — Z0000 Encounter for general adult medical examination without abnormal findings: Secondary | ICD-10-CM

## 2012-07-02 LAB — LIPID PANEL: Total CHOL/HDL Ratio: 1.8 Ratio

## 2012-08-07 ENCOUNTER — Encounter (INDEPENDENT_AMBULATORY_CARE_PROVIDER_SITE_OTHER): Payer: Medicare PPO | Admitting: Ophthalmology

## 2012-08-07 DIAGNOSIS — E11359 Type 2 diabetes mellitus with proliferative diabetic retinopathy without macular edema: Secondary | ICD-10-CM

## 2012-08-07 DIAGNOSIS — H35039 Hypertensive retinopathy, unspecified eye: Secondary | ICD-10-CM

## 2012-08-07 DIAGNOSIS — H4310 Vitreous hemorrhage, unspecified eye: Secondary | ICD-10-CM

## 2012-08-07 DIAGNOSIS — I10 Essential (primary) hypertension: Secondary | ICD-10-CM

## 2012-08-07 DIAGNOSIS — E1165 Type 2 diabetes mellitus with hyperglycemia: Secondary | ICD-10-CM

## 2012-08-07 DIAGNOSIS — H43819 Vitreous degeneration, unspecified eye: Secondary | ICD-10-CM

## 2012-08-09 ENCOUNTER — Telehealth: Payer: Self-pay | Admitting: Family Medicine

## 2012-08-09 MED ORDER — SIMVASTATIN 20 MG PO TABS: 20.0000 mg | ORAL_TABLET | Freq: Every evening | ORAL | Status: DC

## 2012-08-09 NOTE — Telephone Encounter (Signed)
Medication refilled per protocol. 

## 2012-08-14 ENCOUNTER — Telehealth: Payer: Self-pay | Admitting: Family Medicine

## 2012-08-15 MED ORDER — SIMVASTATIN 20 MG PO TABS: 20.0000 mg | ORAL_TABLET | Freq: Every evening | ORAL | Status: DC

## 2012-08-15 NOTE — Telephone Encounter (Signed)
Rx Refilled  

## 2012-08-23 ENCOUNTER — Ambulatory Visit (INDEPENDENT_AMBULATORY_CARE_PROVIDER_SITE_OTHER): Payer: Medicare PPO | Admitting: Ophthalmology

## 2012-08-23 DIAGNOSIS — H35039 Hypertensive retinopathy, unspecified eye: Secondary | ICD-10-CM

## 2012-08-23 DIAGNOSIS — I10 Essential (primary) hypertension: Secondary | ICD-10-CM

## 2012-08-23 DIAGNOSIS — H43819 Vitreous degeneration, unspecified eye: Secondary | ICD-10-CM

## 2012-08-23 DIAGNOSIS — H4310 Vitreous hemorrhage, unspecified eye: Secondary | ICD-10-CM

## 2012-08-23 DIAGNOSIS — E11359 Type 2 diabetes mellitus with proliferative diabetic retinopathy without macular edema: Secondary | ICD-10-CM

## 2012-08-23 DIAGNOSIS — E1165 Type 2 diabetes mellitus with hyperglycemia: Secondary | ICD-10-CM

## 2012-09-02 ENCOUNTER — Other Ambulatory Visit: Payer: Self-pay

## 2012-09-02 ENCOUNTER — Inpatient Hospital Stay (HOSPITAL_COMMUNITY)
Admission: EM | Admit: 2012-09-02 | Discharge: 2012-09-05 | DRG: 247 | Disposition: A | Discharge status: Home / Self Care | Payer: Medicare HMO | Attending: Cardiology | Admitting: Cardiology

## 2012-09-02 ENCOUNTER — Encounter (HOSPITAL_COMMUNITY): Payer: Self-pay | Admitting: Emergency Medicine

## 2012-09-02 ENCOUNTER — Emergency Department (HOSPITAL_COMMUNITY): Payer: Medicare HMO

## 2012-09-02 DIAGNOSIS — Z794 Long term (current) use of insulin: Secondary | ICD-10-CM

## 2012-09-02 DIAGNOSIS — I10 Essential (primary) hypertension: Secondary | ICD-10-CM | POA: Diagnosis present

## 2012-09-02 DIAGNOSIS — E785 Hyperlipidemia, unspecified: Secondary | ICD-10-CM | POA: Diagnosis present

## 2012-09-02 DIAGNOSIS — I214 Non-ST elevation (NSTEMI) myocardial infarction: Secondary | ICD-10-CM

## 2012-09-02 DIAGNOSIS — Z955 Presence of coronary angioplasty implant and graft: Secondary | ICD-10-CM

## 2012-09-02 DIAGNOSIS — M129 Arthropathy, unspecified: Secondary | ICD-10-CM | POA: Diagnosis present

## 2012-09-02 DIAGNOSIS — E7800 Pure hypercholesterolemia, unspecified: Secondary | ICD-10-CM | POA: Diagnosis present

## 2012-09-02 DIAGNOSIS — M899 Disorder of bone, unspecified: Secondary | ICD-10-CM | POA: Diagnosis present

## 2012-09-02 DIAGNOSIS — N390 Urinary tract infection, site not specified: Secondary | ICD-10-CM | POA: Diagnosis present

## 2012-09-02 DIAGNOSIS — T82897A Other specified complication of cardiac prosthetic devices, implants and grafts, initial encounter: Secondary | ICD-10-CM | POA: Diagnosis present

## 2012-09-02 DIAGNOSIS — Y831 Surgical operation with implant of artificial internal device as the cause of abnormal reaction of the patient, or of later complication, without mention of misadventure at the time of the procedure: Secondary | ICD-10-CM | POA: Diagnosis present

## 2012-09-02 DIAGNOSIS — Z85828 Personal history of other malignant neoplasm of skin: Secondary | ICD-10-CM

## 2012-09-02 DIAGNOSIS — I252 Old myocardial infarction: Secondary | ICD-10-CM

## 2012-09-02 DIAGNOSIS — I249 Acute ischemic heart disease, unspecified: Secondary | ICD-10-CM

## 2012-09-02 DIAGNOSIS — K219 Gastro-esophageal reflux disease without esophagitis: Secondary | ICD-10-CM | POA: Diagnosis present

## 2012-09-02 DIAGNOSIS — R609 Edema, unspecified: Secondary | ICD-10-CM | POA: Diagnosis present

## 2012-09-02 DIAGNOSIS — IMO0001 Reserved for inherently not codable concepts without codable children: Secondary | ICD-10-CM | POA: Diagnosis present

## 2012-09-02 DIAGNOSIS — I2510 Atherosclerotic heart disease of native coronary artery without angina pectoris: Secondary | ICD-10-CM | POA: Diagnosis present

## 2012-09-02 LAB — CBC
Hemoglobin: 12.3 g/dL (ref 12.0–15.0)
MCH: 30 pg (ref 26.0–34.0)
MCHC: 34 g/dL (ref 30.0–36.0)
MCV: 88.3 fL (ref 78.0–100.0)
RBC: 4.1 MIL/uL (ref 3.87–5.11)

## 2012-09-02 MED ORDER — HEPARIN BOLUS VIA INFUSION
4000.0000 [IU] | Freq: Once | INTRAVENOUS | Status: AC
  Administered 2012-09-03: 4000 [IU] via INTRAVENOUS

## 2012-09-02 MED ORDER — NITROGLYCERIN IN D5W 200-5 MCG/ML-% IV SOLN
2.0000 ug/min | Freq: Once | INTRAVENOUS | Status: AC
  Administered 2012-09-03: 5 ug/min via INTRAVENOUS
  Filled 2012-09-02: qty 250

## 2012-09-02 MED ORDER — HEPARIN (PORCINE) IN NACL 100-0.45 UNIT/ML-% IJ SOLN
1100.0000 [IU]/h | INTRAMUSCULAR | Status: DC
  Administered 2012-09-03: 1100 [IU]/h via INTRAVENOUS
  Filled 2012-09-02 (×2): qty 250

## 2012-09-02 NOTE — ED Provider Notes (Signed)
History    CSN: [id number] Arrival date & time 09/02/12  2256  First MD Initiated Contact with Patient 09/02/12 2305     Chief Complaint  Patient presents with  . Chest Pain    The history is provided by the patient.   patient reports developing chest heaviness and mid substernal chest discomfort around 2 PM today.  Her pain is been rather constant with associated nausea and shortness of breath.  She has a known history of coronary disease with her last cardiac catheterization in the fall 2013 at which point she had in-stent restenosis and required repeat PCI.  Her cardiologist is Dr. Jacinto Halim.  She received aspirin and 2 nitroglycerin with some improvement in her symptoms.  She continues to have discomfort and pain at this time.  Her EKG demonstrates inferior lateral ST depression without ST elevation.  She does report some shortness of breath over the past several days as well some orthopnea.  She states she's been compliant with her medications including her effient.  No fevers or chills.  No cough or congestion.  Past Medical History  Diagnosis Date  . GERD (gastroesophageal reflux disease)   . PUD (peptic ulcer disease)   . Anemia   . Hypertension   . S/P endoscopy Aug 2011    3 superficial gastric ulcers, NSAID-induced  . S/P colonoscopy Sept 2011    left-sided diverticula, tubular adenoma  . Coronary artery disease   . Shortness of breath   . Diabetes mellitus     insulin dependent  . Headache(784.0)     rare migraines  . Cancer     hx of skin cancer  . Arthritis   . Osteopenia   . Hypercholesterolemia   . SIADH (syndrome of inappropriate ADH production)    Past Surgical History  Procedure Laterality Date  . Tonsillectomy    . Appendectomy    . Eye surgery      cataracts  . Cardiac stents  07/19/2011  . Cardiac catheterization  07/19/2011  . Esophagogastroduodenoscopy  10/16/09    normal without barrett's/three superficial gastric ulcers  . Colonoscopy  12/04/09     normal rectum/left-sided diverticula  . Joint replacement  arthroscopy to knee   History reviewed. No pertinent family history. History  Substance Use Topics  . Smoking status: Never Smoker   . Smokeless tobacco: Never Used  . Alcohol Use: No   OB History   Grav Para Term Preterm Abortions TAB SAB Ect Mult Living                 Review of Systems  Cardiovascular: Positive for chest pain.  All other systems reviewed and are negative.    Allergies  Aspirin  Home Medications   Current Outpatient Rx  Name  Route  Sig  Dispense  Refill  . aspirin EC 81 MG tablet   Oral   Take 81 mg by mouth daily.         Marland Kitchen aspirin EC 81 MG tablet   Oral   Take 324 mg by mouth once.         . carvedilol (COREG) 6.25 MG tablet   Oral   Take 6.25 mg by mouth 2 (two) times daily with a meal.          . furosemide (LASIX) 40 MG tablet   Oral   Take 0.5 tablets (20 mg total) by mouth daily as needed.   30 tablet   11   . insulin  glargine (LANTUS SOLOSTAR) 100 UNIT/ML injection   Subcutaneous   Inject 20 Units into the skin at bedtime.         . insulin lispro (HUMALOG) 100 UNIT/ML injection   Subcutaneous   Inject 7-10 Units into the skin 3 (three) times daily before meals.          Marland Kitchen losartan (COZAAR) 50 MG tablet   Oral   Take 0.5 tablets (25 mg total) by mouth daily.   30 tablet   11   . nitroGLYCERIN (NITROSTAT) 0.4 MG SL tablet   Sublingual   Place 0.4 mg under the tongue every 5 (five) minutes as needed for chest pain.         . Omega-3 Fatty Acids (FISH OIL) 1200 MG CAPS   Oral   Take 1 capsule by mouth 2 (two) times daily.          Marland Kitchen omeprazole (PRILOSEC) 20 MG capsule   Oral   Take 20 mg by mouth 2 (two) times daily.          . prasugrel (EFFIENT) 10 MG TABS   Oral   Take 1 tablet (10 mg total) by mouth daily.   30 tablet   0   . simvastatin (ZOCOR) 20 MG tablet   Oral   Take 1 tablet (20 mg total) by mouth every evening.   90 tablet    1   . meclizine (ANTIVERT) 25 MG tablet   Oral   Take 25 mg by mouth every 4 (four) hours as needed.          BP 182/91  Pulse 107  Temp(Src) 97.7 F (36.5 C) (Oral)  Resp 18  Ht 5' 6.93" (1.7 m)  Wt 207 lb 3.7 oz (94 kg)  BMI 32.53 kg/m2  SpO2 97% Physical Exam  Nursing note and vitals reviewed. Constitutional: She is oriented to person, place, and time. She appears well-developed and well-nourished. No distress.  HENT:  Head: Normocephalic and atraumatic.  Eyes: EOM are normal.  Neck: Normal range of motion.  Cardiovascular: Normal rate, regular rhythm and normal heart sounds.   Pulmonary/Chest: Effort normal and breath sounds normal.  Abdominal: Soft. She exhibits no distension. There is no tenderness.  Musculoskeletal: Normal range of motion.  Neurological: She is alert and oriented to person, place, and time.  Skin: Skin is warm and dry.  Psychiatric: She has a normal mood and affect. Judgment normal.    ED Course  Procedures (including critical care time)   Date: 09/02/2012  Rate: 100  Rhythm: normal sinus rhythm  QRS Axis: normal  Intervals: normal  ST/T Wave abnormalities: Inferior lateral ST depression  Conduction Disutrbances: none  Narrative Interpretation:   Old EKG Reviewed: Change from prior EKG concerning for ischemic changes  CRITICAL CARE Performed by: Lyanne Co Total critical care time: 35 Critical care time was exclusive of separately billable procedures and treating other patients. Critical care was necessary to treat or prevent imminent or life-threatening deterioration. Critical care was time spent personally by me on the following activities: development of treatment plan with patient and/or surrogate as well as nursing, discussions with consultants, evaluation of patient's response to treatment, examination of patient, obtaining history from patient or surrogate, ordering and performing treatments and interventions, ordering and review  of laboratory studies, ordering and review of radiographic studies, pulse oximetry and re-evaluation of patient's condition.     Labs Reviewed  COMPREHENSIVE METABOLIC PANEL - Abnormal; Notable for the following:  Sodium 128 (*)    Chloride 94 (*)    Glucose, Bld 504 (*)    BUN 33 (*)    Creatinine, Ser 1.30 (*)    Alkaline Phosphatase 171 (*)    GFR calc non Af Amer 38 (*)    GFR calc Af Amer 44 (*)    All other components within normal limits  APTT - Abnormal; Notable for the following:    aPTT 47 (*)    All other components within normal limits  PRO B NATRIURETIC PEPTIDE - Abnormal; Notable for the following:    Pro B Natriuretic peptide (BNP) 2843.0 (*)    All other components within normal limits  POCT I-STAT TROPONIN I - Abnormal; Notable for the following:    Troponin i, poc 0.15 (*)    All other components within normal limits  CBC  PROTIME-INR  URINALYSIS, ROUTINE W REFLEX MICROSCOPIC  TROPONIN I  HEPARIN LEVEL (UNFRACTIONATED)  CBC   Dg Chest Portable 1 View  09/02/2012   *RADIOLOGY REPORT*  Clinical Data: Myocardial infarction 2013 with stent placement. Chest pain.  PORTABLE CHEST - 1 VIEW  Comparison: 06/01/2012.  Findings: The cardiopericardial silhouette is within normal for projection.  There is interstitial and alveolar airspace opacity, most compatible with pulmonary edema.  Multifocal infection is considered less likely.  Pulmonary vascular congestion is present with cephalization of pulmonary blood flow.  Small right pleural effusion.  IMPRESSION: Bilateral interstitial and alveolar opacity most compatible with pulmonary edema and mild to moderate CHF.  Multifocal infection considered unlikely.   Original Report Authenticated By: Andreas Newport, M.D.   I personally reviewed the imaging tests through PACS system I reviewed available ER/hospitalization records through the EMR    1. Acute coronary syndrome     MDM  Patient presents with what appears to be  acute coronary syndrome.  Still with active chest pain at this time.  Aspirin given prior to arrival.  Patient be started on heparin bolus and heparin drip per pharmacy.  Nitroglycerin drip now.  The goal be to make this patient pain-free.  I discussed her case with her cardiologist who agrees to admit the patient is a Unit.  Additional Orders Will Be Called in.  The Patient Is Overall Well-Appearing However Does Appear to Have Significant Acute Coronary Syndrome and Ischemic Changes at This Time.  This is a tenous situation and she will Need to Be Followed Closely.  Cardiology: Dr Jacinto Halim, MD  Lyanne Co, MD 09/03/12 [phone]

## 2012-09-02 NOTE — ED Notes (Addendum)
Pt to ED via EMS with c/o sharp med chest pain 3/10 with dizziness and nausea/vomiting, onset 1700 this evening. Hx of MI. Pt taken ASA 324mg  and Nitro x2. Per EMS BP-178/72, O2-95% on room air.

## 2012-09-02 NOTE — Progress Notes (Signed)
ANTICOAGULATION CONSULT NOTE - Initial Consult  Pharmacy Consult for Heparin Indication: chest pain/ACS  Allergies  Allergen Reactions  . Aspirin Other (See Comments)    Intolerant to high doses    Patient Measurements: Height: 5' 6.93" (170 cm) Weight: 207 lb 3.7 oz (94 kg) IBW/kg (Calculated) : 61.44  Vital Signs: Temp: 97.7 F (36.5 C) (06/29 2315) Temp src: Oral (06/29 2315) BP: 182/91 mmHg (06/29 2315) Pulse Rate: 107 (06/29 2315)  Labs: No results found for this basename: HGB, HCT, PLT, APTT, LABPROT, INR, HEPARINUNFRC, CREATININE, CKTOTAL, CKMB, TROPONINI,  in the last 72 hours  Estimated Creatinine Clearance: 39.5 ml/min (by C-G formula based on Cr of 1.38).   Medical History: Past Medical History  Diagnosis Date  . GERD (gastroesophageal reflux disease)   . PUD (peptic ulcer disease)   . Anemia   . Hypertension   . S/P endoscopy Aug 2011    3 superficial gastric ulcers, NSAID-induced  . S/P colonoscopy Sept 2011    left-sided diverticula, tubular adenoma  . Coronary artery disease   . Shortness of breath   . Diabetes mellitus     insulin dependent  . Headache(784.0)     rare migraines  . Cancer     hx of skin cancer  . Arthritis   . Osteopenia   . Hypercholesterolemia   . SIADH (syndrome of inappropriate ADH production)     Medications:  ASA  Coreg  Lasix  Lantus  Humalog  Cozaar  Antivert  Ntg  Lovaza  Prilosec  Effient  Zocor   Assessment: 79 yo female with chest pain for heparin  Goal of Therapy:  Heparin level 0.3-0.7 units/ml Monitor platelets by anticoagulation protocol: Yes   Plan:  Heparin 4000 units IV bolus, then 1100 units/hr Check heparin level in 8 hours.   Eddie Candle 09/02/2012,11:53 PM

## 2012-09-02 NOTE — ED Notes (Signed)
Dr Oletta Lamas has already seen old and new EKG

## 2012-09-03 ENCOUNTER — Encounter (HOSPITAL_COMMUNITY)
Admission: EM | Disposition: A | Discharge status: Home / Self Care | Payer: Self-pay | Source: Home / Self Care | Attending: Cardiology

## 2012-09-03 HISTORY — PX: PERCUTANEOUS CORONARY STENT INTERVENTION (PCI-S): SHX5485

## 2012-09-03 HISTORY — PX: LEFT HEART CATHETERIZATION WITH CORONARY ANGIOGRAM: SHX5451

## 2012-09-03 LAB — PROTIME-INR
INR: 1.01 (ref 0.00–1.49)
Prothrombin Time: 13.1 seconds (ref 11.6–15.2)

## 2012-09-03 LAB — URINALYSIS, ROUTINE W REFLEX MICROSCOPIC
Bilirubin Urine: NEGATIVE
Ketones, ur: NEGATIVE mg/dL
Nitrite: POSITIVE — AB
Protein, ur: NEGATIVE mg/dL
Urobilinogen, UA: 0.2 mg/dL (ref 0.0–1.0)

## 2012-09-03 LAB — URINE MICROSCOPIC-ADD ON

## 2012-09-03 LAB — COMPREHENSIVE METABOLIC PANEL
Alkaline Phosphatase: 127 U/L — ABNORMAL HIGH (ref 39–117)
BUN: 31 mg/dL — ABNORMAL HIGH (ref 6–23)
CO2: 24 mEq/L (ref 19–32)
CO2: 26 mEq/L (ref 19–32)
Calcium: 8.9 mg/dL (ref 8.4–10.5)
Chloride: 98 mEq/L (ref 96–112)
Creatinine, Ser: 1.3 mg/dL — ABNORMAL HIGH (ref 0.50–1.10)
Creatinine, Ser: 1.26 mg/dL — ABNORMAL HIGH (ref 0.50–1.10)
GFR calc Af Amer: 44 mL/min — ABNORMAL LOW (ref >90)
GFR calc non Af Amer: 38 mL/min — ABNORMAL LOW (ref >90)
GFR calc non Af Amer: 40 mL/min — ABNORMAL LOW (ref >90)
Glucose, Bld: 504 mg/dL — ABNORMAL HIGH (ref 70–99)
Potassium: 4.2 mEq/L (ref 3.5–5.1)
Total Bilirubin: 0.8 mg/dL (ref 0.3–1.2)
Total Protein: 7.4 g/dL (ref 6.0–8.3)

## 2012-09-03 LAB — CBC
HCT: 33.6 % — ABNORMAL LOW (ref 36.0–46.0)
Hemoglobin: 11.4 g/dL — ABNORMAL LOW (ref 12.0–15.0)
MCH: 29.7 pg (ref 26.0–34.0)
MCHC: 33.9 g/dL (ref 30.0–36.0)
MCV: 87.5 fL (ref 78.0–100.0)

## 2012-09-03 LAB — CK TOTAL AND CKMB (NOT AT ARMC)
CK, MB: 16.7 ng/mL (ref 0.3–4.0)
Relative Index: 7.1 — ABNORMAL HIGH (ref 0.0–2.5)
Total CK: 398 U/L — ABNORMAL HIGH (ref 7–177)
Total CK: 309 U/L — ABNORMAL HIGH (ref 7–177)

## 2012-09-03 LAB — HEMOGLOBIN A1C
Hgb A1c MFr Bld: 8.6 % — ABNORMAL HIGH (ref <5.7)
Mean Plasma Glucose: 200 mg/dL — ABNORMAL HIGH (ref <117)

## 2012-09-03 LAB — TROPONIN I
Troponin I: >20 ng/mL (ref <0.30)
Troponin I: 14.5 ng/mL (ref <0.30)

## 2012-09-03 LAB — GLUCOSE, CAPILLARY: Glucose-Capillary: 312 mg/dL — ABNORMAL HIGH (ref 70–99)

## 2012-09-03 LAB — MRSA PCR SCREENING: MRSA by PCR: NEGATIVE

## 2012-09-03 LAB — APTT: aPTT: 47 seconds — ABNORMAL HIGH (ref 24–37)

## 2012-09-03 LAB — HEPARIN LEVEL (UNFRACTIONATED): Heparin Unfractionated: 0.37 IU/mL (ref 0.30–0.70)

## 2012-09-03 LAB — POCT I-STAT TROPONIN I

## 2012-09-03 SURGERY — LEFT HEART CATHETERIZATION WITH CORONARY ANGIOGRAM
Anesthesia: LOCAL

## 2012-09-03 MED ORDER — INSULIN GLARGINE 100 UNIT/ML ~~LOC~~ SOLN
20.0000 [IU] | Freq: Every day | SUBCUTANEOUS | Status: DC
  Administered 2012-09-03 – 2012-09-04 (×2): 20 [IU] via SUBCUTANEOUS
  Filled 2012-09-03 (×3): qty 0.2

## 2012-09-03 MED ORDER — SODIUM CHLORIDE 0.9 % IV SOLN: INTRAVENOUS | Status: DC

## 2012-09-03 MED ORDER — CARVEDILOL 6.25 MG PO TABS
6.2500 mg | ORAL_TABLET | Freq: Two times a day (BID) | ORAL | Status: DC
  Administered 2012-09-04 – 2012-09-05 (×3): 6.25 mg via ORAL
  Filled 2012-09-03 (×6): qty 1

## 2012-09-03 MED ORDER — SODIUM CHLORIDE 0.9 % IJ SOLN: 3.0000 mL | INTRAMUSCULAR | Status: DC | PRN

## 2012-09-03 MED ORDER — FUROSEMIDE 20 MG PO TABS
20.0000 mg | ORAL_TABLET | Freq: Every day | ORAL | Status: DC | PRN
  Filled 2012-09-03: qty 1

## 2012-09-03 MED ORDER — PRASUGREL HCL 10 MG PO TABS
10.0000 mg | ORAL_TABLET | Freq: Every day | ORAL | Status: DC
  Administered 2012-09-03 – 2012-09-05 (×3): 10 mg via ORAL
  Filled 2012-09-03 (×3): qty 1

## 2012-09-03 MED ORDER — FENTANYL CITRATE 0.05 MG/ML IJ SOLN
INTRAMUSCULAR | Status: AC
  Filled 2012-09-03: qty 2

## 2012-09-03 MED ORDER — ONDANSETRON HCL 4 MG/2ML IJ SOLN: 4.0000 mg | Freq: Four times a day (QID) | INTRAMUSCULAR | Status: DC | PRN

## 2012-09-03 MED ORDER — SODIUM CHLORIDE 0.9 % IJ SOLN
3.0000 mL | Freq: Two times a day (BID) | INTRAMUSCULAR | Status: DC
  Administered 2012-09-04 (×2): 3 mL via INTRAVENOUS

## 2012-09-03 MED ORDER — FUROSEMIDE 10 MG/ML IJ SOLN
40.0000 mg | Freq: Once | INTRAMUSCULAR | Status: AC
  Administered 2012-09-03: 40 mg via INTRAVENOUS
  Filled 2012-09-03: qty 4

## 2012-09-03 MED ORDER — OMEGA-3-ACID ETHYL ESTERS 1 G PO CAPS
1.0000 g | ORAL_CAPSULE | Freq: Two times a day (BID) | ORAL | Status: DC
  Administered 2012-09-03 – 2012-09-05 (×5): 1 g via ORAL
  Filled 2012-09-03 (×7): qty 1

## 2012-09-03 MED ORDER — ASPIRIN EC 81 MG PO TBEC
81.0000 mg | DELAYED_RELEASE_TABLET | Freq: Every day | ORAL | Status: DC
  Administered 2012-09-03: 81 mg via ORAL

## 2012-09-03 MED ORDER — INSULIN ASPART 100 UNIT/ML ~~LOC~~ SOLN
0.0000 [IU] | SUBCUTANEOUS | Status: DC
  Administered 2012-09-04: 3 [IU] via SUBCUTANEOUS
  Administered 2012-09-04: 5 [IU] via SUBCUTANEOUS
  Administered 2012-09-04 – 2012-09-05 (×3): 2 [IU] via SUBCUTANEOUS

## 2012-09-03 MED ORDER — SODIUM CHLORIDE 0.9 % IJ SOLN
3.0000 mL | Freq: Two times a day (BID) | INTRAMUSCULAR | Status: DC
  Administered 2012-09-03: 3 mL via INTRAVENOUS

## 2012-09-03 MED ORDER — NITROGLYCERIN 0.2 MG/ML ON CALL CATH LAB
INTRAVENOUS | Status: AC
  Filled 2012-09-03: qty 1

## 2012-09-03 MED ORDER — PANTOPRAZOLE SODIUM 40 MG PO TBEC
40.0000 mg | DELAYED_RELEASE_TABLET | Freq: Every day | ORAL | Status: DC
  Administered 2012-09-03 – 2012-09-05 (×3): 40 mg via ORAL
  Filled 2012-09-03 (×3): qty 1

## 2012-09-03 MED ORDER — LIDOCAINE-EPINEPHRINE 1 %-1:100000 IJ SOLN
INTRAMUSCULAR | Status: AC
  Filled 2012-09-03: qty 1

## 2012-09-03 MED ORDER — SODIUM CHLORIDE 0.9 % IV SOLN: 250.0000 mL | INTRAVENOUS | Status: DC | PRN

## 2012-09-03 MED ORDER — MIDAZOLAM HCL 2 MG/2ML IJ SOLN
INTRAMUSCULAR | Status: AC
  Filled 2012-09-03: qty 2

## 2012-09-03 MED ORDER — LOSARTAN POTASSIUM 25 MG PO TABS
25.0000 mg | ORAL_TABLET | Freq: Every day | ORAL | Status: DC
  Administered 2012-09-03 – 2012-09-05 (×3): 25 mg via ORAL
  Filled 2012-09-03 (×3): qty 1

## 2012-09-03 MED ORDER — NITROGLYCERIN 0.4 MG SL SUBL
0.4000 mg | SUBLINGUAL_TABLET | SUBLINGUAL | Status: DC | PRN
Start: 2012-09-03 — End: 2012-09-05

## 2012-09-03 MED ORDER — SODIUM CHLORIDE 0.9 % IJ SOLN
3.0000 mL | Freq: Two times a day (BID) | INTRAMUSCULAR | Status: DC
  Administered 2012-09-03 – 2012-09-04 (×3): 3 mL via INTRAVENOUS

## 2012-09-03 MED ORDER — ACETAMINOPHEN 325 MG PO TABS: 650.0000 mg | ORAL_TABLET | ORAL | Status: DC | PRN

## 2012-09-03 MED ORDER — INSULIN ASPART 100 UNIT/ML ~~LOC~~ SOLN
10.0000 [IU] | Freq: Once | SUBCUTANEOUS | Status: AC
  Administered 2012-09-03: 10 [IU] via INTRAVENOUS
  Filled 2012-09-03: qty 1

## 2012-09-03 MED ORDER — HEPARIN (PORCINE) IN NACL 2-0.9 UNIT/ML-% IJ SOLN
INTRAMUSCULAR | Status: AC
  Filled 2012-09-03: qty 1000

## 2012-09-03 MED ORDER — ASPIRIN EC 81 MG PO TBEC: 324.0000 mg | DELAYED_RELEASE_TABLET | Freq: Once | ORAL | Status: DC

## 2012-09-03 MED ORDER — ASPIRIN EC 81 MG PO TBEC
81.0000 mg | DELAYED_RELEASE_TABLET | Freq: Every morning | ORAL | Status: DC
  Administered 2012-09-03 – 2012-09-05 (×3): 81 mg via ORAL
  Filled 2012-09-03 (×3): qty 1

## 2012-09-03 MED ORDER — MECLIZINE HCL 25 MG PO TABS
25.0000 mg | ORAL_TABLET | Freq: Four times a day (QID) | ORAL | Status: DC | PRN
  Filled 2012-09-03: qty 1

## 2012-09-03 MED ORDER — NITROGLYCERIN 0.4 MG SL SUBL: 0.4000 mg | SUBLINGUAL_TABLET | SUBLINGUAL | Status: DC | PRN

## 2012-09-03 MED ORDER — LIDOCAINE HCL (PF) 1 % IJ SOLN
INTRAMUSCULAR | Status: AC
  Filled 2012-09-03: qty 30

## 2012-09-03 MED ORDER — SODIUM CHLORIDE 0.9 % IV SOLN: 1.0000 mL/kg/h | INTRAVENOUS | Status: DC

## 2012-09-03 MED ORDER — CEFAZOLIN SODIUM 1-5 GM-% IV SOLN
INTRAVENOUS | Status: AC
  Filled 2012-09-03: qty 50

## 2012-09-03 MED ORDER — ASPIRIN EC 81 MG PO TBEC: 81.0000 mg | DELAYED_RELEASE_TABLET | Freq: Every day | ORAL | Status: DC

## 2012-09-03 MED ORDER — SIMVASTATIN 20 MG PO TABS
20.0000 mg | ORAL_TABLET | Freq: Every day | ORAL | Status: DC
  Administered 2012-09-04: 20 mg via ORAL
  Filled 2012-09-03 (×3): qty 1

## 2012-09-03 MED ORDER — BIVALIRUDIN 250 MG IV SOLR
INTRAVENOUS | Status: AC
  Filled 2012-09-03: qty 250

## 2012-09-03 NOTE — Interval H&P Note (Signed)
History and Physical Interval Note:  09/03/2012 5:08 PM  Claudia [name]  has presented today for surgery, with the diagnosis of cp  The various methods of treatment have been discussed with the patient and family. After consideration of risks, benefits and other options for treatment, the patient has consented to  Procedure(s): LEFT HEART CATHETERIZATION WITH CORONARY ANGIOGRAM (N/A) and possible PTCA as a surgical intervention .  The patient's history has been reviewed, patient examined, no change in status, stable for surgery.  I have reviewed the patient's chart and labs.  Questions were answered to the patient's satisfaction.     Cath Lab Visit (complete for each Cath Lab visit)  Clinical Evaluation Leading to the Procedure:   ACS: yes  Non-ACS:    Anginal Classification: CCS IV  Anti-ischemic medical therapy: Maximal Therapy (2 or more classes of medications)  Non-Invasive Test Results: No non-invasive testing performed  Prior CABG: No previous CABG        [facility]

## 2012-09-03 NOTE — ED Notes (Signed)
Attempted to call report

## 2012-09-03 NOTE — Progress Notes (Signed)
  Echocardiogram 2D Echocardiogram has been performed.  Georgian Co 09/03/2012, 3:15 PM

## 2012-09-03 NOTE — CV Procedure (Addendum)
Procedure performed:  Left heart catheterization including hemodynamic monitoring of the left ventricle, LV gram, selective right and left coronary arteriography.  PTCA and stenting of the ostial circumflex coronary artery, PTCA and balloon angioplasty of the left ramus intermediate. Performed: Femoral arteriogram and closure of the femoral arterial access with Mare Ferrari.   Indication: Patient is a 79 year-old female with history of hypertension, hyperlipidemia, Diabetes Mellitus who presents with NSTEMI. Patient has  1) H/O Prox/Mid Circumflex 2.5x25 Promus , ramus intermediate 2.5x26 Resolute DES stent placed on 07/19/11.  2).  PTCA and scoring balloon PTCA of the Ramus intermediate and circumflex in-stent restenosis on 12/21/2011 with 2.5 x 10 mm angiosculpt balloon.  3) 02/20/2012  stenting of the circumflex coronary artery mid and proximal segment with implantation of a 2.5 x 32 mm Promus premier drug-eluting stent for recurrent in-stent restenosis She presented again with lateral ST segment depression with markedly elevated cardiac markers for NSTEMI. Hence is brought to the cardiac catheterization lab to reevaluate her coronary anatomy.   Hemodynamic data:  Aortic pressure was 119/49 with a mean of 78 mm mercury.  LV 125/4, EDP 9 mm Hg. No PG across the AV.   Left ventricle: There is mild inferior and inferolateral hypokinesis. There was no significant mitral regurgitation. Ejection fraction was 50 to 55%.  Right coronary artery: Is dominant, with a high-grade calcific 99% ostial stenosis. A small caliber vessel. Diffusely diseased. Has collaterals from the left system. Previously while I performed cardiac catheterization via right radial arterial access, in spite of multiple attempts, I had been unable to cannulate the right coronary artery and it appeared to be occluded during the aortic root injections.  Left main coronary artery is large with mild diffuse calcification.   Circumflex  coronary artery: A large vessel giving origin to a small obtuse marginal 1. There is diffuse in-stent restenosis noted in the midsegment and ostial circumflex coronary artery. Proximal circumflex coronary artery which is stented(but not sandwich stented)  has a 80% to 90 stenosis. There is also 60-70% mid segment instent restenosis.  LAD: LAD gives origin to a large diagonal-1. LAD has mild luminal irregularities.   Ramus intermediate: The vessel is occluded. This was used to be a  large vessel there is diffuse in-stent restenosis. There were 2 stents in the proximal and midsegment and both showed severe diffuse in-stent restenosis and progression of coronary artery disease.   Discussion: Although patient has had instent restenosis previously, there is significant diffuse disease and progression of coronary artery disease and ramus intermediate is not bypassable and circumflex carotid stenosis is focal. Hence we'll proceed with intervention.  Interventional data: Successful PTCA and stenting of the ostial circumflex coronary artery with implantation of a 2.5x12  Xience DES. Stenosis reduced from 80-90% to 0% with TIMI-3 to TIMI-3 flow maintained at the end of the procedure. Successful PTCA and balloon angioplasty of the occluded ramus intermediate. Stenosis reduced from 1 or percent to less than 30%. Successful PTCA and balloon angioplasty of the in-stent restenotic midsegment of the circumflex coronary artery with the same stent balloon. There was a 60-70% in-stent restenosis in the midsegment of the mid circumflex coronary artery stent. Will need Dual antiplatelet therapy with Effient and ASA 81 mg for at least one more year.   Technique: Under sterile precautions using a 6 French right femoral arterial access, a 6 French sheath was introduced into the right femoral artery under fluoroscopy guidance. A 5 [place] multipurpose B2 catheter was advanced into the  ascending aorta and then into the left  ventricle. Hemodynamics are analysed. LV angiogram   performed in LAO and RAO projection. Catheter pulled into the ascending aorta and  left coronary artery was cannulated and angiography was performed in multiple views. Due to subselective engagement, a change this to a 5 [place] JR 4 diagnostic catheter and right coronary artery was selectively engaged with moderate amount of difficulty and angiography was performed. Then a 5 [place] Judkins left 4 diagnostic was utilized to engage left main coronary artery and angiography was repeated.  Catheter exchanged out of the body over J-Wire. NO immediate complications noted. Patient tolerated the procedure well.   Technique of intervention:  Using a 6 [place] XB 3.5 sidehole guide catheter the left main  coronary  was selected and cannulated. Using Angiomax for anticoagulation, I utilized a BMW guidewire and across the  ramus intermediate with the help of a 2.0 x 15 mm trek balloon. I was unable to pass the guidewire without balloon support. Multiple balloon inflations were performed throughout the stented segment from the [facility] to the midsegment at 16 atmospheric pressure for 1 minute each x3. At this point intracoronary nitroglycerin was administered at previous steps and angiography was repeated. I was able to established TIMI-3 flow but there was significant diffuse in-stent restenosis and diffuse progression of coronary artery disease distally. Then I paid attention to the circumflex coronary artery ostium and midsegment of the circumflex coronary artery in-stent restenosis. I utilized a Investment banker, operational guidewire and as this guidewire would not go to the circumflex coronary artery, I placed this in the ramus intermediate and pulled the ramus intermediate BMW wire into the circumflex coronary artery. I utilized a 2.0 x 15 mm Trek to perform balloon angioplasty of the ostium and also the midsegment of the in-stent restenosis at 16 atmospheric pressure. This was followed  by stenting with a 2.5 x 12 mm Xience drug-eluting stent into the ostium of the circumflex coronary artery. Stent deployed at 12 atmospheric pressure for 60 seconds and then I postdilated at c 16 atmospheric pressure with the same stent balloon throughout the stented segment and into the proximal segment of the previously placed stent. Excellent result was evident. Intracoronary nitroglycerin also administered. I then readvanced 2.0 x 15 mm trek balloon into the ramus intermediate and perform 12 atmospheric inflation into the ostium. Angiography is performed. Results were deemed successful, guidewire was withdrawn guide catheter disengaged and pulled out of the body. Right femoral arteriogram was performed through the arterial access sheath and the access was closed with Perclose.

## 2012-09-03 NOTE — Progress Notes (Signed)
UR Chart Review Completed  

## 2012-09-03 NOTE — ED Notes (Signed)
Heparin verified by Smitty Knudsen

## 2012-09-03 NOTE — Progress Notes (Signed)
CRITICAL VALUE ALERT  Critical value received:  CKMB 38.7, Troponin >20  Date of notification:  09/03/12  Time of notification:  1020  Critical value read back:yes  Nurse who received alert:  Lora Havens  MD notified (1st page):  Dr Jacinto Halim  Time of first page:  1021  MD notified (2nd page):  Time of second page:  Responding MD:  MD Jacinto Halim  Time MD responded:  1021

## 2012-09-03 NOTE — ED Notes (Signed)
Critical labs shown to [facility]

## 2012-09-03 NOTE — H&P (Signed)
Claudia [name] is an 79 y.o. female.   Chief Complaint: Chest pain HPI: Claudia [name] is well-known to me, presently 79 years of age with known coronary artery disease.  Claudia [name] has had recurrent in-stent restenosis to her circumflex coronary artery requiring sandwich stenting recently. Claudia [name] had been doing well until last afternoon, started having chest discomfort and thought that Claudia [name] was having GERD, however due to her worsening symptoms Claudia [name] took a sublingual nitroglycerin, felt better.  As the evening approach, her chest pain associated with nausea, mild diaphoresis got worse.  EMS was activated.  Claudia [name] was brought to the emergency room for further evaluation.  In the emergency room, Claudia [name] was found to have positive troponins suggestive of myocardial injury along with EKG abnormalities to suggest subendocardial infarct versus ischemia in the lateral wall.  Claudia [name] is further admitted to the hospital for further evaluation.  This morning Claudia [name] states that Claudia [name] is doing well and has not had any recurrence of chest pain.  Claudia [name] has chronic leg edema, but denies any symptoms of claudication, painful swelling of the lower extremities, ulcerations in her foot. Chest pain is described as heaviness in the middle of the chest without any radiation.  It persisted pretty much throughout the whole yesterday since afternoon.  Claudia [name] did feel mildly short of breath with the chest discomfort.  Past Medical History  Diagnosis Date  . GERD (gastroesophageal reflux disease)   . PUD (peptic ulcer disease)   . Anemia   . Hypertension   . S/P endoscopy Aug 2011    3 superficial gastric ulcers, NSAID-induced  . S/P colonoscopy Sept 2011    left-sided diverticula, tubular adenoma  . Coronary artery disease   . Shortness of breath   . Diabetes mellitus     insulin dependent  . Headache(784.0)     rare migraines  . Cancer     hx of skin cancer  . Arthritis   . Osteopenia   . Hypercholesterolemia   . SIADH (syndrome of inappropriate  ADH production)     Past Surgical History  Procedure Laterality Date  . Tonsillectomy    . Appendectomy    . Eye surgery      cataracts  . Cardiac stents  07/19/2011  . Cardiac catheterization  07/19/2011  . Esophagogastroduodenoscopy  10/16/09    normal without barrett's/three superficial gastric ulcers  . Colonoscopy  12/04/09    normal rectum/left-sided diverticula  . Joint replacement  arthroscopy to knee    History reviewed. No pertinent family history. Social History:  reports that Claudia [name] has never smoked. Claudia [name] has never used smokeless tobacco. Claudia [name] reports that Claudia [name] does not drink alcohol or use illicit drugs.  Allergies:  Allergies  Allergen Reactions  . Aspirin Other (See Comments)    Intolerant to high doses    Medications Prior to Admission  Medication Sig Dispense Refill  . aspirin EC 81 MG tablet Take 81 mg by mouth daily.      Marland Kitchen aspirin EC 81 MG tablet Take 324 mg by mouth once.      . carvedilol (COREG) 6.25 MG tablet Take 6.25 mg by mouth 2 (two) times daily with a meal.       . furosemide (LASIX) 40 MG tablet Take 0.5 tablets (20 mg total) by mouth daily as needed.  30 tablet  11  . insulin glargine (LANTUS SOLOSTAR) 100 UNIT/ML injection Inject 20 Units into the skin at bedtime.      . insulin lispro (HUMALOG) 100 UNIT/ML  injection Inject 7-10 Units into the skin 3 (three) times daily before meals.       Marland Kitchen losartan (COZAAR) 50 MG tablet Take 0.5 tablets (25 mg total) by mouth daily.  30 tablet  11  . nitroGLYCERIN (NITROSTAT) 0.4 MG SL tablet Place 0.4 mg under the tongue every 5 (five) minutes as needed for chest pain.      . Omega-3 Fatty Acids (FISH OIL) 1200 MG CAPS Take 1 capsule by mouth 2 (two) times daily.       Marland Kitchen omeprazole (PRILOSEC) 20 MG capsule Take 20 mg by mouth 2 (two) times daily.       . prasugrel (EFFIENT) 10 MG TABS Take 1 tablet (10 mg total) by mouth daily.  30 tablet  0  . simvastatin (ZOCOR) 20 MG tablet Take 1 tablet (20 mg total) by mouth  every evening.  90 tablet  1  . meclizine (ANTIVERT) 25 MG tablet Take 25 mg by mouth every 4 (four) hours as needed.          Review of Systems - no recent weight changes, denies symptoms of claudication, denies symptoms of neurological deficits or TIA.  Claudia [name] is diabetic, well-controlled.  Other systems are negative. Claudia [name] has chronic GERD. NO dark stools or bloody stools   Blood pressure 154/65, pulse 64, temperature 98.2 F (36.8 C), temperature source Oral, resp. rate 17, height 5\' 7"  (1.702 m), weight 88.9 kg (195 lb 15.8 oz), SpO2 100.00%. General appearance: alert, cooperative, appears stated age and no distress Eyes: normal Neck: no adenopathy, no carotid bruit, no JVD, supple, symmetrical, trachea midline and thyroid not enlarged, symmetric, no tenderness/mass/nodules Neck: JVP - normal, carotids 2+= without bruits Resp: clear to auscultation bilaterally Chest wall: no tenderness Cardio: regular rate and rhythm, S1, S2 normal, no murmur, click, rub or gallop GI: soft, non-tender; bowel sounds normal; no masses,  no organomegaly Extremities: edema 2 plus and Homans sign is negative, no sign of DVT Pulses: 2+ and symmetric Skin: Skin color, texture, turgor normal. No rashes or lesions Neurologic: Grossly normal  Results for orders placed during the hospital encounter of 09/02/12 (from the past 48 hour(s))  CBC     Status: None   Collection Time    09/02/12 11:26 PM      Result Value Range   WBC 7.4  4.0 - 10.5 K/uL   RBC 4.10  3.87 - 5.11 MIL/uL   Hemoglobin 12.3  12.0 - 15.0 g/dL   HCT 40.9  81.1 - 91.4 %   MCV 88.3  78.0 - 100.0 fL   MCH 30.0  26.0 - 34.0 pg   MCHC 34.0  30.0 - 36.0 g/dL   RDW 78.2  95.6 - 21.3 %   Platelets 213  150 - 400 K/uL  COMPREHENSIVE METABOLIC PANEL     Status: Abnormal   Collection Time    09/02/12 11:26 PM      Result Value Range   Sodium 128 (*) 135 - 145 mEq/L   Potassium 4.9  3.5 - 5.1 mEq/L   Chloride 94 (*) 96 - 112 mEq/L   CO2 24  19  - 32 mEq/L   Glucose, Bld 504 (*) 70 - 99 mg/dL   BUN 33 (*) 6 - 23 mg/dL   Creatinine, Ser 0.86 (*) 0.50 - 1.10 mg/dL   Calcium 8.9  8.4 - 57.8 mg/dL   Total Protein 7.4  6.0 - 8.3 g/dL   Albumin 3.7  3.5 - 5.2  g/dL   AST 22  0 - 37 U/L   ALT 18  0 - 35 U/L   Alkaline Phosphatase 171 (*) 39 - 117 U/L   Total Bilirubin 0.7  0.3 - 1.2 mg/dL   GFR calc non Af Amer 38 (*) >90 mL/min   GFR calc Af Amer 44 (*) >90 mL/min   Comment:            The eGFR has been calculated     using the CKD EPI equation.     This calculation has not been     validated in all clinical     situations.     eGFR's persistently     <90 mL/min signify     possible Chronic Kidney Disease.  PROTIME-INR     Status: None   Collection Time    09/02/12 11:26 PM      Result Value Range   Prothrombin Time 13.1  11.6 - 15.2 seconds   INR 1.01  0.00 - 1.49  APTT     Status: Abnormal   Collection Time    09/02/12 11:26 PM      Result Value Range   aPTT 47 (*) 24 - 37 seconds   Comment:            IF BASELINE aPTT IS ELEVATED,     SUGGEST Claudia [name] RISK ASSESSMENT     BE USED TO DETERMINE APPROPRIATE     ANTICOAGULANT THERAPY.  PRO B NATRIURETIC PEPTIDE     Status: Abnormal   Collection Time    09/02/12 11:36 PM      Result Value Range   Pro B Natriuretic peptide (BNP) 2843.0 (*) 0 - 450 pg/mL  POCT I-STAT TROPONIN I     Status: Abnormal   Collection Time    09/02/12 11:50 PM      Result Value Range   Troponin i, poc 0.15 (*) 0.00 - 0.08 ng/mL   Comment NOTIFIED PHYSICIAN     Comment 3            Comment: Due to the release kinetics of cTnI,     a negative result within the first hours     of the onset of symptoms does not rule out     myocardial infarction with certainty.     If myocardial infarction is still suspected,     repeat the test at appropriate intervals.  TROPONIN I     Status: Abnormal   Collection Time    09/03/12 12:19 AM      Result Value Range   Troponin I 0.37 (*) <0.30 ng/mL    Comment:            Due to the release kinetics of cTnI,     a negative result within the first hours     of the onset of symptoms does not rule out     myocardial infarction with certainty.     If myocardial infarction is still suspected,     repeat the test at appropriate intervals.     CRITICAL RESULT CALLED TO, READ BACK BY AND VERIFIED WITH:     NEWNAM K,RN 09/03/12 0126 WAYK  URINALYSIS, ROUTINE W REFLEX MICROSCOPIC     Status: Abnormal   Collection Time    09/03/12  2:31 AM      Result Value Range   Color, Urine YELLOW  YELLOW   APPearance TURBID (*) CLEAR   Specific Gravity, Urine 1.013  1.005 -  1.030   pH 5.5  5.0 - 8.0   Glucose, UA >1000 (*) NEGATIVE mg/dL   Hgb urine dipstick MODERATE (*) NEGATIVE   Bilirubin Urine NEGATIVE  NEGATIVE   Ketones, ur NEGATIVE  NEGATIVE mg/dL   Protein, ur NEGATIVE  NEGATIVE mg/dL   Urobilinogen, UA 0.2  0.0 - 1.0 mg/dL   Nitrite POSITIVE (*) NEGATIVE   Leukocytes, UA SMALL (*) NEGATIVE  MRSA PCR SCREENING     Status: None   Collection Time    09/03/12  2:31 AM      Result Value Range   MRSA by PCR NEGATIVE  NEGATIVE   Comment:            The GeneXpert MRSA Assay (FDA     approved for NASAL specimens     only), is one component of a     comprehensive MRSA colonization     surveillance program. It is not     intended to diagnose MRSA     infection nor to guide or     monitor treatment for     MRSA infections.  URINE MICROSCOPIC-ADD ON     Status: Abnormal   Collection Time    09/03/12  2:31 AM      Result Value Range   Squamous Epithelial / LPF RARE  RARE   WBC, UA 3-6  <3 WBC/hpf   RBC / HPF 7-10  <3 RBC/hpf   Bacteria, UA MANY (*) RARE  GLUCOSE, CAPILLARY     Status: Abnormal   Collection Time    09/03/12  3:01 AM      Result Value Range   Glucose-Capillary 312 (*) 70 - 99 mg/dL  HEPARIN LEVEL (UNFRACTIONATED)     Status: None   Collection Time    09/03/12  8:44 AM      Result Value Range   Heparin Unfractionated 0.37   0.30 - 0.70 IU/mL   Comment:            IF HEPARIN RESULTS ARE BELOW     EXPECTED VALUES, AND Claudia [name]     DOSAGE HAS BEEN CONFIRMED,     SUGGEST FOLLOW UP TESTING     OF ANTITHROMBIN III LEVELS.  CBC     Status: Abnormal   Collection Time    09/03/12  8:44 AM      Result Value Range   WBC 6.6  4.0 - 10.5 K/uL   RBC 3.84 (*) 3.87 - 5.11 MIL/uL   Hemoglobin 11.4 (*) 12.0 - 15.0 g/dL   HCT 16.1 (*) 09.6 - 04.5 %   MCV 87.5  78.0 - 100.0 fL   MCH 29.7  26.0 - 34.0 pg   MCHC 33.9  30.0 - 36.0 g/dL   RDW 40.9  81.1 - 91.4 %   Platelets 210  150 - 400 K/uL   Dg Chest Portable 1 View  09/02/2012   *RADIOLOGY REPORT*  Clinical Data: Myocardial infarction 2013 with stent placement. Chest pain.  PORTABLE CHEST - 1 VIEW  Comparison: 06/01/2012.  Findings: The cardiopericardial silhouette is within normal for projection.  There is interstitial and alveolar airspace opacity, most compatible with pulmonary edema.  Multifocal infection is considered less likely.  Pulmonary vascular congestion is present with cephalization of pulmonary blood flow.  Small right pleural effusion.  IMPRESSION: Bilateral interstitial and alveolar opacity most compatible with pulmonary edema and mild to moderate CHF.  Multifocal infection considered unlikely.   Original Report Authenticated By: Andreas Newport, M.D.  Labs:   Lab Results  Component Value Date   WBC 6.6 09/03/2012   HGB 11.4* 09/03/2012   HCT 33.6* 09/03/2012   MCV 87.5 09/03/2012   PLT 210 09/03/2012    Recent Labs Lab 09/02/12 2326  NA 128*  K 4.9  CL 94*  CO2 24  BUN 33*  CREATININE 1.30*  CALCIUM 8.9  PROT 7.4  BILITOT 0.7  ALKPHOS 171*  ALT 18  AST 22  GLUCOSE 504*   Lab Results  Component Value Date   CKTOTAL 65 02/20/2012   CKMB 2.6 02/20/2012   TROPONINI 0.37* 09/03/2012    Lipid Panel     Component Value Date/Time   CHOL 143 07/02/2012 1016   TRIG 33 07/02/2012 1016   HDL 78 07/02/2012 1016   CHOLHDL 1.8 07/02/2012 1016    VLDL 7 07/02/2012 1016   LDLCALC 58 07/02/2012 1016    EKG: 09/03/2012:, Sinus rhythm, normal intervals, lateral ST segment depression suggestive of subendocardial infarct versus ischemia.  This is new compared to prior EKG.  Assessment/Plan 1.   NSTEMI 2.  Coronary artery disease: Heat cath 02/21/12 for NSTEMI: sandwich drug-eluting stents with implantation of 2.5 x 32 mm Promus in the circumflex and 2.5 x 16 mm Promus stent in Ramus intermediate. H/O  Instent restenosis of 07/19/11: Prox/Mid Circumflex 2.5x25 Promus, ramus intermediate 2.5x26 Resolute DES stent. Has occluded RCA(old). 3.  Hypertension 4.  Hyperlipidemia 5.  Diabetes mellitus type 2 controlled 6.  Chronic leg edema  Recommendation: I have reviewed her records, Claudia [name] presenting with recurrent chest pain and I suspect Claudia [name] probably has either progression of coronary artery disease or recurrence of in-stent restenosis in her circumflex coronary artery.  Claudia [name]'ll be set up for cardiac catheterization this afternoon and I'll make further recommendation.  If there is been a significant progression of coronary artery disease, I may consider CABG due to recurrence of restenosis.  All questions were answered to the Claudia [name] and his son at the bedside.  [name], MD 09/03/2012, 10:01 AM [facility] Pager: [phone] Office: [phone] If no answer: Cell:  [phone]

## 2012-09-03 NOTE — Progress Notes (Signed)
ANTICOAGULATION CONSULT NOTE - Follow Up Consult  Pharmacy Consult for heparin Indication: chest pain/ACS  Allergies  Allergen Reactions  . Aspirin Other (See Comments)    Intolerant to high doses    Patient Measurements: Height: 5\' 7"  (170.2 cm) Weight: 195 lb 15.8 oz (88.9 kg) IBW/kg (Calculated) : 61.6   Vital Signs: Temp: 98.2 F (36.8 C) (06/30 0800) Temp src: Oral (06/30 0800) BP: 154/65 mmHg (06/30 0800) Pulse Rate: 64 (06/30 0800)  Labs:  Recent Labs  09/02/12 2326 09/03/12 0019 09/03/12 0844  HGB 12.3  --  11.4*  HCT 36.2  --  33.6*  PLT 213  --  210  APTT 47*  --   --   LABPROT 13.1  --   --   INR 1.01  --   --   HEPARINUNFRC  --   --  0.37  CREATININE 1.30*  --  1.26*  CKTOTAL  --   --  447*  CKMB  --   --  38.7*  TROPONINI  --  0.37* >20.00*    Estimated Creatinine Clearance: 42.1 ml/min (by C-G formula based on Cr of 1.26).   Medications:  Prescriptions prior to admission  Medication Sig Dispense Refill  . aspirin EC 81 MG tablet Take 81 mg by mouth daily.      Marland Kitchen aspirin EC 81 MG tablet Take 324 mg by mouth once.      . carvedilol (COREG) 6.25 MG tablet Take 6.25 mg by mouth 2 (two) times daily with a meal.       . furosemide (LASIX) 40 MG tablet Take 0.5 tablets (20 mg total) by mouth daily as needed.  30 tablet  11  . insulin glargine (LANTUS SOLOSTAR) 100 UNIT/ML injection Inject 20 Units into the skin at bedtime.      . insulin lispro (HUMALOG) 100 UNIT/ML injection Inject 7-10 Units into the skin 3 (three) times daily before meals.       Marland Kitchen losartan (COZAAR) 50 MG tablet Take 0.5 tablets (25 mg total) by mouth daily.  30 tablet  11  . nitroGLYCERIN (NITROSTAT) 0.4 MG SL tablet Place 0.4 mg under the tongue every 5 (five) minutes as needed for chest pain.      . Omega-3 Fatty Acids (FISH OIL) 1200 MG CAPS Take 1 capsule by mouth 2 (two) times daily.       Marland Kitchen omeprazole (PRILOSEC) 20 MG capsule Take 20 mg by mouth 2 (two) times daily.       .  prasugrel (EFFIENT) 10 MG TABS Take 1 tablet (10 mg total) by mouth daily.  30 tablet  0  . simvastatin (ZOCOR) 20 MG tablet Take 1 tablet (20 mg total) by mouth every evening.  90 tablet  1  . meclizine (ANTIVERT) 25 MG tablet Take 25 mg by mouth every 4 (four) hours as needed.        Assessment: 79 year old woman with known CAD on heparin for ACS.  Troponin >20.  Plan for cardiac cath this afternoon.  Heparin is therapeutic - level 0.37 Goal of Therapy:  Heparin level 0.3-0.7 units/ml Monitor platelets by anticoagulation protocol: Yes   Plan:  Continue heparin at 1100 units/hr.  Follow up after cardiac cath.  Mickeal Skinner 09/03/2012,11:08 AM

## 2012-09-04 LAB — CBC
HCT: 32.8 % — ABNORMAL LOW (ref 36.0–46.0)
MCH: 29.9 pg (ref 26.0–34.0)
MCV: 89.1 fL (ref 78.0–100.0)
RDW: 13.2 % (ref 11.5–15.5)
WBC: 5.9 10*3/uL (ref 4.0–10.5)

## 2012-09-04 LAB — BASIC METABOLIC PANEL
Chloride: 104 mEq/L (ref 96–112)
GFR calc Af Amer: 43 mL/min — ABNORMAL LOW (ref >90)
GFR calc non Af Amer: 37 mL/min — ABNORMAL LOW (ref >90)
Glucose, Bld: 114 mg/dL — ABNORMAL HIGH (ref 70–99)
Potassium: 4.4 mEq/L (ref 3.5–5.1)
Sodium: 139 mEq/L (ref 135–145)

## 2012-09-04 LAB — GLUCOSE, CAPILLARY
Glucose-Capillary: 142 mg/dL — ABNORMAL HIGH (ref 70–99)
Glucose-Capillary: 201 mg/dL — ABNORMAL HIGH (ref 70–99)
Glucose-Capillary: 290 mg/dL — ABNORMAL HIGH (ref 70–99)

## 2012-09-04 MED ORDER — CIPROFLOXACIN HCL 500 MG PO TABS
500.0000 mg | ORAL_TABLET | Freq: Two times a day (BID) | ORAL | Status: DC
  Administered 2012-09-04 – 2012-09-05 (×2): 500 mg via ORAL
  Filled 2012-09-04 (×6): qty 1

## 2012-09-04 MED FILL — Sodium Chloride IV Soln 0.9%: INTRAVENOUS | Qty: 50 | Status: AC

## 2012-09-04 NOTE — Progress Notes (Signed)
Subjective:  Patient is presently doing well and denies any chest pain, shortness of breath. Patient has not ambulated yet.  Objective:  Vital Signs in the last 24 hours: Temp:  [98 F (36.7 C)-99 F (37.2 C)] 99 F (37.2 C) (07/01 0745) Pulse Rate:  [56-90] 58 (07/01 0950) Resp:  [11-19] 11 (07/01 0950) BP: (109-164)/(28-57) 114/28 mmHg (07/01 0950) SpO2:  [98 %-100 %] 100 % (07/01 0950)  Intake/Output from previous day: 06/30 0701 - 07/01 0700 In: 896 [P.O.:60; I.V.:836] Out: 1351 [Urine:1350; Stool:1]  Physical Exam:  General appearance: alert, cooperative, appears stated age and no distress  Eyes: normal  Neck: no adenopathy, no carotid bruit, no JVD, supple, symmetrical, trachea midline and thyroid not enlarged, symmetric, no tenderness/mass/nodules  Neck: JVP - normal, carotids 2+= without bruits  Resp: clear to auscultation bilaterally  Chest wall: no tenderness  Cardio: regular rate and rhythm, S1, S2 normal, no murmur, click, rub or gallop  GI: soft, non-tender; bowel sounds normal; no masses, no organomegaly  Extremities: edema 2 plus and Homans sign is negative, no sign of DVT  Pulses: 2+ and symmetric, right femoral arterial access site without any hematoma.  Skin: Skin color, texture, turgor normal. No rashes or lesions  Neurologic: Grossly normal   Lab Results:  Recent Labs  09/03/12 0844 09/04/12 0348  WBC 6.6 5.9  HGB 11.4* 11.0*  PLT 210 195    Recent Labs  09/03/12 0844 09/04/12 0348  NA 135 139  K 4.2 4.4  CL 98 104  CO2 26 27  GLUCOSE 243* 114*  BUN 31* 26*  CREATININE 1.26* 1.33*    Recent Labs  09/03/12 1241 09/03/12 1809  TROPONINI 14.50* 7.05*   Hepatic Function Panel  Recent Labs  09/03/12 0844  PROT 7.0  ALBUMIN 3.5  AST 56*  ALT 19  ALKPHOS 127*  BILITOT 0.8   No results found for this basename: CHOL,  in the last 72 hours No results found for this basename: PROTIME,  in the last 72 hours Lipid Panel      Component Value Date/Time   CHOL 143 07/02/2012 1016   TRIG 33 07/02/2012 1016   HDL 78 07/02/2012 1016   CHOLHDL 1.8 07/02/2012 1016   VLDL 7 07/02/2012 1016   LDLCALC 58 07/02/2012 1016   Cardiac Panel (last 3 results)  Recent Labs  09/03/12 0844 09/03/12 1241 09/03/12 1809  CKTOTAL 447* 398* 309*  CKMB 38.7* 28.3* 16.7*  TROPONINI >20.00* 14.50* 7.05*  RELINDX 8.7* 7.1* 5.4*    Cardiac Studies:  EKG: 09/04/2012: Normal sinus rhythm, normal intervals, lateral 1 mm ST segment depression in V5 and V6, cannot exclude lateral ischemia. Compared to the EKG done 09/03/2012, inferior ST segment depression is no longer present, lateral ST segment depression with T. inversion improved.  Assessment/Plan:  1. NSTEMI, successful angioplasty to the circumflex coronary artery with implantation of drug-eluting stent. Balloon angioplasty to occluded ramus intermediate coronary artery. Severe progression of native vessel disease and also recurrent in-stent restenosis. 2. Diabetes mellitus type 2, uncontrolled. Last hemoglobin A1c in April of 2014 was 8.1. We will recheck this. I suspect uncontrolled diabetes to be one of the etiologies for severe progression of native vessel disease. 3. Mild hyperlipidemia, lipid profile 07/02/2012 revealing total cholesterol of 143, triglycerides 30, HDL 78, LDL was 58.  Recommendation: Patient is presently doing well and remains angina free. I will transfer her to telemetry, ambulate her, and she remained stable he will consider discharge in the  morning.  [name], M.D. 09/04/2012, 11:37 AM [facility] Pager: [phone] Office: [phone] If no answer: [phone]

## 2012-09-04 NOTE — Care Management Note (Signed)
    Page 1 of 1   09/04/2012     2:52:02 PM   CARE MANAGEMENT NOTE 09/04/2012  Patient:  GUNDA, MAQUEDA   Account Number:  [id number]  Date Initiated:  09/04/2012  Documentation initiated by:  Junius Creamer  Subjective/Objective Assessment:   adm w mi     Action/Plan:   lives w fam, pcp dr Broadus John pickard   Anticipated DC Date:     Anticipated DC Plan:        DC Planning Services  CM consult      Choice offered to / List presented to:             Status of service:   Medicare Important Message given?   (If response is "NO", the following Medicare IM given date fields will be blank) Date Medicare IM given:   Date Additional Medicare IM given:    Discharge Disposition:  HOME/SELF CARE  Per UR Regulation:  Reviewed for med. necessity/level of care/duration of stay  If discussed at Long Length of Stay Meetings, dates discussed:    Comments:  7/1 1450p debbie Batina Dougan rn,bsn pt was on effient pta. poss dc in am.

## 2012-09-04 NOTE — Progress Notes (Signed)
eLink Physician-Brief Progress Note Patient Name: Claudia [name] DOB: 1931-12-26 MRN: [id number]  Date of Service  09/04/2012   HPI/Events of Note   Urine cx shows > 100k cfu E coli.   eICU Interventions  Discussed case w Dr Nadara Eaton > will start cipro x 5 days.    Intervention Category Intermediate Interventions: Infection - evaluation and management  [name] S. 09/04/2012, 4:50 PM

## 2012-09-04 NOTE — Progress Notes (Addendum)
CARDIAC REHAB PHASE I   PRE:  Rate/Rhythm: 76 SR    BP: sitting 116/38    SaO2: 94 RA  MODE:  Ambulation: 170 ft   POST:  Rate/Rhythm: 94 SR    BP: sitting 112/43     SaO2: 98 RA  Pt tolerated walk fair.  Assisted x1.  Does not use walker at home, but will try with assistive device tomorrow.  Admits to recent deconditioning due to prior medical history.  C/o fatigue at end of walk and no SOB.  Education complete. CRPII referred to [facility].  [id number]   Elissa Lovett [place] CES, ACSM 09/04/2012 3:04 PM

## 2012-09-05 LAB — URINE CULTURE

## 2012-09-05 LAB — CBC
Hemoglobin: 11.1 g/dL — ABNORMAL LOW (ref 12.0–15.0)
MCH: 30.2 pg (ref 26.0–34.0)
MCV: 90.2 fL (ref 78.0–100.0)
RBC: 3.68 MIL/uL — ABNORMAL LOW (ref 3.87–5.11)
WBC: 5.3 10*3/uL (ref 4.0–10.5)

## 2012-09-05 LAB — GLUCOSE, CAPILLARY: Glucose-Capillary: 93 mg/dL (ref 70–99)

## 2012-09-05 MED ORDER — CIPROFLOXACIN HCL 500 MG PO TABS: 500.0000 mg | ORAL_TABLET | Freq: Two times a day (BID) | ORAL | Status: DC

## 2012-09-05 MED ORDER — ISOSORBIDE MONONITRATE ER 60 MG PO TB24: 60.0000 mg | ORAL_TABLET | Freq: Every day | ORAL | Status: DC

## 2012-09-05 NOTE — Progress Notes (Signed)
CARDIAC REHAB PHASE I   PRE:  Rate/Rhythm: 62SR  BP:  Supine:   Sitting: 112/40  Standing:    SaO2: 98%RA  MODE:  Ambulation: 500 ft   POST:  Rate/Rhythm: 83SR  BP:  Supine:   Sitting: 114/40  Standing:    SaO2: 96%RA 0830-0855 Pt walked 500 ft on RA with rolling walker and minimal asst. Tolerated well without CP. Pt stated she was very sleepy yesterday and that is reason she could only take short walk yesterday. Pt does not know if she wants walker for home or not. To discuss with son and let her nurse know if she wants one. To bed after walk. Claudia Bobo DunlapRNBSN   Luetta Nutting, RN BSN  09/05/2012 8:53 AM

## 2012-09-05 NOTE — Progress Notes (Signed)
Pt to d/c today with son; IV and tele monitor d/c at this time; pt awaiting arrival of son; pt verbalized d/c instructions via teach back method; will cont. To monitor.

## 2012-09-05 NOTE — Discharge Summary (Signed)
Physician Discharge Summary  Patient ID: Claudia [name] MRN: [id number] DOB/AGE: [birth year] 79 y.o.  Admit date: 09/02/2012 Discharge date: 09/05/2012  Primary Discharge Diagnosis Acute inferior lateral NSTEMI Secondary Discharge Diagnosis CAD Hypertension DM-2 uncontrolled Hyperlipidemia UTI: Urine culture 09/03/2012: Escherichia coli.  Sensitivities pending.  Patient started on ciprofloxacin empirically.  Significant Diagnostic Studies: 09/03/12: Left heart catheterization including hemodynamic monitoring of the left ventricle, LV gram, selective right and left coronary arteriography.Successful PTCA and stenting of the ostial circumflex coronary artery with implantation of a 2.5x12 Xience DES. Stenosis reduced from 80-90% to 0% with TIMI-3 to TIMI-3 flow maintained at the end of the procedure.  Successful PTCA and balloon angioplasty of the occluded ramus intermediate. Stenosis reduced from 1 or percent to less than 30%.  Successful PTCA and balloon angioplasty of the in-stent restenotic midsegment of the circumflex coronary artery with the same stent balloon. There was a 60-70% in-stent restenosis in the midsegment of the mid circumflex coronary artery stent.  Will need Dual antiplatelet therapy with Effient and ASA 81 mg for at least one more year.   Hospital Course: Patient is well-known to me, presently 79 years of age with known coronary artery disease. She has had recurrent in-stent restenosis to her circumflex coronary artery requiring sandwich stenting recently. She had been doing well until last afternoon, started having chest discomfort and thought that she was having GERD, however due to her worsening symptoms she took a sublingual nitroglycerin, felt better. As the evening approach, her chest pain associated with nausea, mild diaphoresis got worse. EMS was activated. She was brought to the emergency room for further evaluation. In the emergency room, she was found to have positive  troponins suggestive of myocardial injury along with EKG abnormalities to suggest subendocardial infarct versus ischemia in the lateral wall. She is further admitted to the hospital for further evaluation.  She underwent cardiac catheterization the following morning after she was stable, I took her to the cardiac catheterization lab and she underwent successful angioplasty.  The following day she was watched in the intensive care unit where she remained stable.  She was incidentally found to have UTI she was started on ciprofloxacin.  Overall she did well and on the day of discharge she essentially remained asymptomatic without any chest pain, shortness of breath, PND or orthopnea.  She'll be discharged home today with outpatient follow-up as usual.   Discharge Exam: Blood pressure 140/60, pulse 69, temperature 98 F (36.7 C), temperature source Oral, resp. rate 16, height 5\' 7"  (1.702 m), weight 86.6 kg (190 lb 14.7 oz), SpO2 97.00%.   General appearance: alert, cooperative, appears stated age and no distress  Eyes: normal  Neck: no adenopathy, no carotid bruit, no JVD, supple, symmetrical, trachea midline and thyroid not enlarged, symmetric, no tenderness/mass/nodules  Neck: JVP - normal, carotids 2+= without bruits  Resp: clear to auscultation bilaterally  Chest wall: no tenderness  Cardio: regular rate and rhythm, S1, S2 normal, no murmur, click, rub or gallop  GI: soft, non-tender; bowel sounds normal; no masses, no organomegaly  Extremities: edema 2 plus and Homans sign is negative, no sign of DVT  Pulses: 2+ and symmetric, right femoral arterial access site without any hematoma.  Skin: Skin color, texture, turgor normal. No rashes or lesions  Neurologic: Grossly normal   Labs:   Lab Results  Component Value Date   WBC 5.3 09/05/2012   HGB 11.1* 09/05/2012   HCT 33.2* 09/05/2012   MCV 90.2 09/05/2012   PLT  184 09/05/2012    Recent Labs Lab 09/03/12 0844 09/04/12 0348  NA 135 139  K  4.2 4.4  CL 98 104  CO2 26 27  BUN 31* 26*  CREATININE 1.26* 1.33*  CALCIUM 9.1 8.9  PROT 7.0  --   BILITOT 0.8  --   ALKPHOS 127*  --   ALT 19  --   AST 56*  --   GLUCOSE 243* 114*   Lab Results  Component Value Date   CKTOTAL 309* 09/03/2012   CKMB 16.7* 09/03/2012   TROPONINI 7.05* 09/03/2012    Lipid Panel     Component Value Date/Time   CHOL 143 07/02/2012 1016   TRIG 33 07/02/2012 1016   HDL 78 07/02/2012 1016   CHOLHDL 1.8 07/02/2012 1016   VLDL 7 07/02/2012 1016   LDLCALC 58 07/02/2012 1016    EKG: Normal sinus rhythm, normal intervals, minimal ST segment depression in V5 and V6.  Compared to the initial EKG, the ST depressions in the inferior leads and lateral leads has improved significantly at the time of discharge.  EKG changes are suggestive of subendocardial ischemia. Cardiac Panel (last 3 results)  Recent Labs  09/03/12 0844 09/03/12 1241 09/03/12 1809  CKTOTAL 447* 398* 309*  CKMB 38.7* 28.3* 16.7*  TROPONINI >20.00* 14.50* 7.05*  RELINDX 8.7* 7.1* 5.4*      Radiology: Dg Chest Portable 1 View  09/02/2012   *RADIOLOGY REPORT*  Clinical Data: Myocardial infarction 2013 with stent placement. Chest pain.  PORTABLE CHEST - 1 VIEW  Comparison: 06/01/2012.  Findings: The cardiopericardial silhouette is within normal for projection.  There is interstitial and alveolar airspace opacity, most compatible with pulmonary edema.  Multifocal infection is considered less likely.  Pulmonary vascular congestion is present with cephalization of pulmonary blood flow.  Small right pleural effusion.  IMPRESSION: Bilateral interstitial and alveolar opacity most compatible with pulmonary edema and mild to moderate CHF.  Multifocal infection considered unlikely.   Original Report Authenticated By: Andreas Newport, M.D.      FOLLOW UP PLANS AND APPOINTMENTS Discharge Orders   Future Appointments Provider Department Dept Phone   12/24/2012 8:00 AM Sherrie George, MD [facility] [phone]   Future Orders Complete By Expires     Amb Referral to Cardiac Rehabilitation  As directed         Medication List         aspirin EC 81 MG tablet  Take 81 mg by mouth daily.     aspirin EC 81 MG tablet  Take 324 mg by mouth once.     carvedilol 6.25 MG tablet  Commonly known as:  COREG  Take 6.25 mg by mouth 2 (two) times daily with a meal.     ciprofloxacin 500 MG tablet  Commonly known as:  CIPRO  Take 1 tablet (500 mg total) by mouth 2 (two) times daily.     Fish Oil 1200 MG Caps  Take 1 capsule by mouth 2 (two) times daily.     furosemide 40 MG tablet  Commonly known as:  LASIX  Take 0.5 tablets (20 mg total) by mouth daily as needed.     insulin lispro 100 UNIT/ML injection  Commonly known as:  HUMALOG  Inject 7-10 Units into the skin 3 (three) times daily before meals.     isosorbide mononitrate 60 MG 24 hr tablet  Commonly known as:  IMDUR  Take 1 tablet (60 mg total) by mouth  daily.     LANTUS SOLOSTAR 100 UNIT/ML injection  Generic drug:  insulin glargine  Inject 20 Units into the skin at bedtime.     losartan 50 MG tablet  Commonly known as:  COZAAR  Take 0.5 tablets (25 mg total) by mouth daily.     meclizine 25 MG tablet  Commonly known as:  ANTIVERT  Take 25 mg by mouth every 4 (four) hours as needed.     nitroGLYCERIN 0.4 MG SL tablet  Commonly known as:  NITROSTAT  Place 0.4 mg under the tongue every 5 (five) minutes as needed for chest pain.     omeprazole 20 MG capsule  Commonly known as:  PRILOSEC  Take 20 mg by mouth 2 (two) times daily.     prasugrel 10 MG Tabs  Commonly known as:  EFFIENT  Take 1 tablet (10 mg total) by mouth daily.     simvastatin 20 MG tablet  Commonly known as:  ZOCOR  Take 1 tablet (20 mg total) by mouth every evening.          [name], MD 09/05/2012, 5:29 PM  Pager: [phone] Office: [phone] If no answer: [phone]

## 2012-10-10 ENCOUNTER — Other Ambulatory Visit: Payer: Self-pay

## 2012-11-06 ENCOUNTER — Encounter: Payer: Self-pay | Admitting: Internal Medicine

## 2012-11-21 ENCOUNTER — Other Ambulatory Visit: Payer: Self-pay | Admitting: Family Medicine

## 2012-12-02 ENCOUNTER — Emergency Department (HOSPITAL_COMMUNITY): Payer: Medicare HMO

## 2012-12-02 ENCOUNTER — Inpatient Hospital Stay (HOSPITAL_COMMUNITY)
Admission: EM | Admit: 2012-12-02 | Discharge: 2012-12-05 | DRG: 247 | Disposition: A | Discharge status: Home Health | Payer: Medicare HMO | Attending: Cardiology | Admitting: Cardiology

## 2012-12-02 ENCOUNTER — Encounter (HOSPITAL_COMMUNITY): Payer: Self-pay

## 2012-12-02 DIAGNOSIS — E785 Hyperlipidemia, unspecified: Secondary | ICD-10-CM | POA: Diagnosis present

## 2012-12-02 DIAGNOSIS — I10 Essential (primary) hypertension: Secondary | ICD-10-CM | POA: Diagnosis present

## 2012-12-02 DIAGNOSIS — Z85828 Personal history of other malignant neoplasm of skin: Secondary | ICD-10-CM

## 2012-12-02 DIAGNOSIS — I214 Non-ST elevation (NSTEMI) myocardial infarction: Principal | ICD-10-CM | POA: Diagnosis present

## 2012-12-02 DIAGNOSIS — I252 Old myocardial infarction: Secondary | ICD-10-CM

## 2012-12-02 DIAGNOSIS — Y849 Medical procedure, unspecified as the cause of abnormal reaction of the patient, or of later complication, without mention of misadventure at the time of the procedure: Secondary | ICD-10-CM | POA: Diagnosis present

## 2012-12-02 DIAGNOSIS — D638 Anemia in other chronic diseases classified elsewhere: Secondary | ICD-10-CM | POA: Diagnosis present

## 2012-12-02 DIAGNOSIS — R079 Chest pain, unspecified: Secondary | ICD-10-CM | POA: Diagnosis present

## 2012-12-02 DIAGNOSIS — I2510 Atherosclerotic heart disease of native coronary artery without angina pectoris: Secondary | ICD-10-CM | POA: Diagnosis present

## 2012-12-02 DIAGNOSIS — Z96659 Presence of unspecified artificial knee joint: Secondary | ICD-10-CM

## 2012-12-02 DIAGNOSIS — Z79899 Other long term (current) drug therapy: Secondary | ICD-10-CM

## 2012-12-02 DIAGNOSIS — IMO0001 Reserved for inherently not codable concepts without codable children: Secondary | ICD-10-CM | POA: Diagnosis present

## 2012-12-02 DIAGNOSIS — Z794 Long term (current) use of insulin: Secondary | ICD-10-CM

## 2012-12-02 DIAGNOSIS — I200 Unstable angina: Secondary | ICD-10-CM

## 2012-12-02 DIAGNOSIS — T82897A Other specified complication of cardiac prosthetic devices, implants and grafts, initial encounter: Secondary | ICD-10-CM | POA: Diagnosis present

## 2012-12-02 DIAGNOSIS — Z7982 Long term (current) use of aspirin: Secondary | ICD-10-CM

## 2012-12-02 LAB — GLUCOSE, CAPILLARY: Glucose-Capillary: 121 mg/dL — ABNORMAL HIGH (ref 70–99)

## 2012-12-02 LAB — COMPREHENSIVE METABOLIC PANEL
BUN: 39 mg/dL — ABNORMAL HIGH (ref 6–23)
CO2: 21 mEq/L (ref 19–32)
Calcium: 8.9 mg/dL (ref 8.4–10.5)
Creatinine, Ser: 1.42 mg/dL — ABNORMAL HIGH (ref 0.50–1.10)
GFR calc Af Amer: 40 mL/min — ABNORMAL LOW (ref >90)
GFR calc non Af Amer: 34 mL/min — ABNORMAL LOW (ref >90)
Glucose, Bld: 292 mg/dL — ABNORMAL HIGH (ref 70–99)
Sodium: 128 mEq/L — ABNORMAL LOW (ref 135–145)
Total Protein: 7 g/dL (ref 6.0–8.3)

## 2012-12-02 LAB — PRO B NATRIURETIC PEPTIDE: Pro B Natriuretic peptide (BNP): 3295 pg/mL — ABNORMAL HIGH (ref 0–450)

## 2012-12-02 LAB — CBC WITH DIFFERENTIAL/PLATELET
Eosinophils Absolute: 0.2 10*3/uL (ref 0.0–0.7)
Eosinophils Relative: 2 % (ref 0–5)
HCT: 32.1 % — ABNORMAL LOW (ref 36.0–46.0)
Lymphocytes Relative: 16 % (ref 12–46)
Lymphs Abs: 1.3 10*3/uL (ref 0.7–4.0)
MCH: 30.8 pg (ref 26.0–34.0)
MCV: 88.2 fL (ref 78.0–100.0)
Monocytes Absolute: 0.5 10*3/uL (ref 0.1–1.0)
Monocytes Relative: 6 % (ref 3–12)
Platelets: 206 10*3/uL (ref 150–400)
RBC: 3.64 MIL/uL — ABNORMAL LOW (ref 3.87–5.11)

## 2012-12-02 LAB — PROTIME-INR: Prothrombin Time: 12.5 seconds (ref 11.6–15.2)

## 2012-12-02 LAB — POCT I-STAT TROPONIN I: Troponin i, poc: 0.06 ng/mL (ref 0.00–0.08)

## 2012-12-02 LAB — TSH: TSH: 0.596 u[IU]/mL (ref 0.350–4.500)

## 2012-12-02 LAB — URINALYSIS, ROUTINE W REFLEX MICROSCOPIC
Nitrite: NEGATIVE
Protein, ur: 30 mg/dL — AB
Urobilinogen, UA: 0.2 mg/dL (ref 0.0–1.0)

## 2012-12-02 LAB — TROPONIN I
Troponin I: 6.49 ng/mL (ref <0.30)
Troponin I: 8.95 ng/mL (ref <0.30)

## 2012-12-02 MED ORDER — SODIUM CHLORIDE 0.9 % IJ SOLN
3.0000 mL | Freq: Two times a day (BID) | INTRAMUSCULAR | Status: DC
  Administered 2012-12-02 – 2012-12-04 (×3): 3 mL via INTRAVENOUS

## 2012-12-02 MED ORDER — INSULIN ASPART 100 UNIT/ML ~~LOC~~ SOLN
2.0000 [IU] | Freq: Three times a day (TID) | SUBCUTANEOUS | Status: DC
  Administered 2012-12-02: 2 [IU] via SUBCUTANEOUS

## 2012-12-02 MED ORDER — HEPARIN (PORCINE) IN NACL 100-0.45 UNIT/ML-% IJ SOLN
1300.0000 [IU]/h | INTRAMUSCULAR | Status: DC
  Administered 2012-12-02: 1100 [IU]/h via INTRAVENOUS
  Administered 2012-12-02: 1300 [IU]/h via INTRAVENOUS
  Filled 2012-12-02 (×3): qty 250

## 2012-12-02 MED ORDER — PRASUGREL HCL 10 MG PO TABS
10.0000 mg | ORAL_TABLET | Freq: Every day | ORAL | Status: DC
  Administered 2012-12-02 – 2012-12-05 (×4): 10 mg via ORAL
  Filled 2012-12-02 (×4): qty 1

## 2012-12-02 MED ORDER — SIMVASTATIN 20 MG PO TABS
20.0000 mg | ORAL_TABLET | Freq: Every evening | ORAL | Status: DC
  Administered 2012-12-02 – 2012-12-04 (×3): 20 mg via ORAL
  Filled 2012-12-02 (×4): qty 1

## 2012-12-02 MED ORDER — FUROSEMIDE 40 MG PO TABS: 40.0000 mg | ORAL_TABLET | Freq: Every day | ORAL | Status: DC | PRN

## 2012-12-02 MED ORDER — ACETAMINOPHEN 325 MG PO TABS
650.0000 mg | ORAL_TABLET | ORAL | Status: DC | PRN
  Administered 2012-12-04: 650 mg via ORAL
  Filled 2012-12-02: qty 2

## 2012-12-02 MED ORDER — INSULIN ASPART 100 UNIT/ML ~~LOC~~ SOLN
0.0000 [IU] | Freq: Three times a day (TID) | SUBCUTANEOUS | Status: DC
  Administered 2012-12-02: 5 [IU] via SUBCUTANEOUS
  Administered 2012-12-03 – 2012-12-04 (×2): 3 [IU] via SUBCUTANEOUS

## 2012-12-02 MED ORDER — INSULIN GLARGINE 100 UNIT/ML ~~LOC~~ SOLN
20.0000 [IU] | Freq: Every day | SUBCUTANEOUS | Status: DC
  Administered 2012-12-03 – 2012-12-04 (×2): 20 [IU] via SUBCUTANEOUS
  Filled 2012-12-02 (×4): qty 0.2

## 2012-12-02 MED ORDER — NITROGLYCERIN 0.4 MG SL SUBL: 0.4000 mg | SUBLINGUAL_TABLET | SUBLINGUAL | Status: DC | PRN

## 2012-12-02 MED ORDER — LOSARTAN POTASSIUM 25 MG PO TABS
25.0000 mg | ORAL_TABLET | Freq: Every day | ORAL | Status: DC
  Administered 2012-12-02 – 2012-12-05 (×4): 25 mg via ORAL
  Filled 2012-12-02 (×4): qty 1

## 2012-12-02 MED ORDER — METOPROLOL TARTRATE 1 MG/ML IV SOLN: 5.0000 mg | Freq: Once | INTRAVENOUS | Status: DC

## 2012-12-02 MED ORDER — INSULIN ASPART 100 UNIT/ML ~~LOC~~ SOLN: 0.0000 [IU] | Freq: Three times a day (TID) | SUBCUTANEOUS | Status: DC

## 2012-12-02 MED ORDER — SODIUM CHLORIDE 0.9 % IV SOLN
INTRAVENOUS | Status: DC
  Administered 2012-12-03 (×2): via INTRAVENOUS

## 2012-12-02 MED ORDER — SODIUM CHLORIDE 0.9 % IJ SOLN: 3.0000 mL | INTRAMUSCULAR | Status: DC | PRN

## 2012-12-02 MED ORDER — ONDANSETRON HCL 4 MG/2ML IJ SOLN
4.0000 mg | Freq: Once | INTRAMUSCULAR | Status: AC
  Administered 2012-12-02: 4 mg via INTRAVENOUS
  Filled 2012-12-02: qty 2

## 2012-12-02 MED ORDER — SODIUM CHLORIDE 0.9 % IV SOLN: 250.0000 mL | INTRAVENOUS | Status: DC | PRN

## 2012-12-02 MED ORDER — PANTOPRAZOLE SODIUM 40 MG PO TBEC
40.0000 mg | DELAYED_RELEASE_TABLET | Freq: Every day | ORAL | Status: DC
  Administered 2012-12-02 – 2012-12-05 (×4): 40 mg via ORAL
  Filled 2012-12-02 (×4): qty 1

## 2012-12-02 MED ORDER — CARVEDILOL 6.25 MG PO TABS
6.2500 mg | ORAL_TABLET | Freq: Two times a day (BID) | ORAL | Status: DC
Start: 2012-12-02 — End: 2012-12-05
  Administered 2012-12-02 – 2012-12-05 (×5): 6.25 mg via ORAL
  Filled 2012-12-02 (×8): qty 1

## 2012-12-02 MED ORDER — NITROGLYCERIN IN D5W 200-5 MCG/ML-% IV SOLN
10.0000 ug/min | INTRAVENOUS | Status: DC
Start: 2012-12-02 — End: 2012-12-04
  Administered 2012-12-02: 10 ug/min via INTRAVENOUS
  Filled 2012-12-02 (×4): qty 250

## 2012-12-02 MED ORDER — ONDANSETRON HCL 4 MG/2ML IJ SOLN
4.0000 mg | Freq: Four times a day (QID) | INTRAMUSCULAR | Status: DC | PRN
  Administered 2012-12-03: 4 mg via INTRAVENOUS
  Filled 2012-12-02: qty 2

## 2012-12-02 MED ORDER — HEPARIN BOLUS VIA INFUSION
4000.0000 [IU] | Freq: Once | INTRAVENOUS | Status: AC
  Administered 2012-12-02: 4000 [IU] via INTRAVENOUS
  Filled 2012-12-02: qty 4000

## 2012-12-02 MED ORDER — ASPIRIN EC 81 MG PO TBEC
81.0000 mg | DELAYED_RELEASE_TABLET | Freq: Every day | ORAL | Status: DC
  Administered 2012-12-03 – 2012-12-05 (×3): 81 mg via ORAL
  Filled 2012-12-02 (×3): qty 1

## 2012-12-02 MED ORDER — OMEGA-3-ACID ETHYL ESTERS 1 G PO CAPS
2.0000 g | ORAL_CAPSULE | Freq: Two times a day (BID) | ORAL | Status: DC
  Administered 2012-12-02 – 2012-12-05 (×7): 2 g via ORAL
  Filled 2012-12-02 (×8): qty 2

## 2012-12-02 MED ORDER — ASPIRIN EC 81 MG PO TBEC: 81.0000 mg | DELAYED_RELEASE_TABLET | Freq: Every day | ORAL | Status: DC

## 2012-12-02 MED ORDER — MECLIZINE HCL 25 MG PO TABS
25.0000 mg | ORAL_TABLET | Freq: Three times a day (TID) | ORAL | Status: DC | PRN
  Filled 2012-12-02: qty 1

## 2012-12-02 NOTE — ED Provider Notes (Addendum)
CSN: [id number]     Arrival date & time 12/02/12  4098 History   First MD Initiated Contact with Patient 12/02/12 0353     Chief Complaint  Patient presents with  . Chest Pain   (Consider location/radiation/quality/duration/timing/severity/associated sxs/prior Treatment) The history is provided by the patient.  Claudia [name] is a 79 y.o. female history of reflux, ulcer disease, CAD status post multiple stents here presenting with chest pain. Chest pain around midnight tonight. She was unable to sleep due to the pain. Substernal no radiation. Denies any shortness of breath. Denies any abdominal pain or fever. Take that this is similar to her prior stents.    Past Medical History  Diagnosis Date  . GERD (gastroesophageal reflux disease)   . PUD (peptic ulcer disease)   . Anemia   . Hypertension   . S/P endoscopy Aug 2011    3 superficial gastric ulcers, NSAID-induced  . S/P colonoscopy Sept 2011    left-sided diverticula, tubular adenoma  . Coronary artery disease   . Shortness of breath   . Diabetes mellitus     insulin dependent  . Headache(784.0)     rare migraines  . Cancer     hx of skin cancer  . Arthritis   . Osteopenia   . Hypercholesterolemia   . SIADH (syndrome of inappropriate ADH production)    Past Surgical History  Procedure Laterality Date  . Tonsillectomy    . Appendectomy    . Eye surgery      cataracts  . Cardiac stents  07/19/2011  . Cardiac catheterization  07/19/2011  . Esophagogastroduodenoscopy  10/16/09    normal without barrett's/three superficial gastric ulcers  . Colonoscopy  12/04/09    normal rectum/left-sided diverticula  . Joint replacement  arthroscopy to knee   History reviewed. No pertinent family history. History  Substance Use Topics  . Smoking status: Never Smoker   . Smokeless tobacco: Never Used  . Alcohol Use: No   OB History   Grav Para Term Preterm Abortions TAB SAB Ect Mult Living                 Review of Systems   Cardiovascular: Positive for chest pain.  All other systems reviewed and are negative.    Allergies  Aspirin  Home Medications   Current Outpatient Rx  Name  Route  Sig  Dispense  Refill  . aspirin EC 81 MG tablet   Oral   Take 81 mg by mouth daily.         . carvedilol (COREG) 6.25 MG tablet   Oral   Take 6.25 mg by mouth 2 (two) times daily with a meal.          . furosemide (LASIX) 40 MG tablet   Oral   Take 0.5 tablets (20 mg total) by mouth daily as needed.   30 tablet   11   . insulin glargine (LANTUS SOLOSTAR) 100 UNIT/ML injection   Subcutaneous   Inject 20 Units into the skin at bedtime.         . insulin lispro (HUMALOG) 100 UNIT/ML injection   Subcutaneous   Inject 7-10 Units into the skin 3 (three) times daily before meals.          . isosorbide mononitrate (IMDUR) 60 MG 24 hr tablet   Oral   Take 1 tablet (60 mg total) by mouth daily.   30 tablet   3   . losartan (COZAAR) 50 MG  tablet   Oral   Take 0.5 tablets (25 mg total) by mouth daily.   30 tablet   11   . Omega-3 Fatty Acids (FISH OIL) 1200 MG CAPS   Oral   Take 1 capsule by mouth 2 (two) times daily.          Marland Kitchen omeprazole (PRILOSEC) 20 MG capsule   Oral   Take 20 mg by mouth 2 (two) times daily.          . prasugrel (EFFIENT) 10 MG TABS   Oral   Take 1 tablet (10 mg total) by mouth daily.   30 tablet   0   . simvastatin (ZOCOR) 20 MG tablet   Oral   Take 1 tablet (20 mg total) by mouth every evening.   90 tablet   1   . B-D ULTRAFINE III SHORT PEN 31G X 8 MM MISC      USE AS DIRECTED WITH LANTUS PEN   100 each   5   . meclizine (ANTIVERT) 25 MG tablet   Oral   Take 25 mg by mouth every 4 (four) hours as needed.         . nitroGLYCERIN (NITROSTAT) 0.4 MG SL tablet   Sublingual   Place 0.4 mg under the tongue every 5 (five) minutes as needed for chest pain.          BP 158/65  Pulse 86  Temp(Src) 97.7 F (36.5 C) (Oral)  Resp 17  SpO2  97% Physical Exam  Nursing note and vitals reviewed. Constitutional: She is oriented to person, place, and time.  Chronically ill, uncomfortable   HENT:  Head: Normocephalic.  Mouth/Throat: Oropharynx is clear and moist.  Eyes: Conjunctivae are normal. Pupils are equal, round, and reactive to light.  Neck: Normal range of motion. Neck supple.  Cardiovascular: Normal rate, regular rhythm and normal heart sounds.   Pulmonary/Chest: Effort normal.  Diminished breath sounds bilateral bases   Abdominal: Soft. Bowel sounds are normal. She exhibits no distension. There is no tenderness. There is no rebound.  Musculoskeletal: Normal range of motion.  Neurological: She is alert and oriented to person, place, and time.  Skin: Skin is warm and dry.  Psychiatric: She has a normal mood and affect. Her behavior is normal. Judgment and thought content normal.    ED Course  Procedures (including critical care time) Labs Review Labs Reviewed  CBC WITH DIFFERENTIAL - Abnormal; Notable for the following:    RBC 3.64 (*)    Hemoglobin 11.2 (*)    HCT 32.1 (*)    All other components within normal limits  COMPREHENSIVE METABOLIC PANEL - Abnormal; Notable for the following:    Sodium 128 (*)    Chloride 95 (*)    Glucose, Bld 292 (*)    BUN 39 (*)    Creatinine, Ser 1.42 (*)    Alkaline Phosphatase 124 (*)    GFR calc non Af Amer 34 (*)    GFR calc Af Amer 40 (*)    All other components within normal limits  PRO B NATRIURETIC PEPTIDE  URINALYSIS, ROUTINE W REFLEX MICROSCOPIC  PROTIME-INR  POCT I-STAT TROPONIN I   Imaging Review Dg Chest Port 1 View  12/02/2012   CLINICAL DATA:  Chest pain and gastroesophageal reflux disease. Hypertension and diabetes.  EXAM: PORTABLE CHEST - 1 VIEW  COMPARISON:  09/02/2012 and 06/01/2012  FINDINGS: Midline trachea. Normal heart size with atherosclerosis in the transverse aorta. No left and  no definite right pleural effusion. No pneumothorax. Interstitial  thickening is moderate and similar to 09/02/2012. Increased since 06/01/2012. No lobar consolidation.  IMPRESSION: Persistent moderate interstitial thickening, suspicious for mild interstitial edema.   Electronically Signed   By: Jeronimo Greaves   On: 12/02/2012 04:27    Date: 12/02/2012  Rate: 91  Rhythm: normal sinus rhythm  QRS Axis: normal  Intervals: normal  ST/T Wave abnormalities: st depression inferior lateral leads, changed from prior  Conduction Disutrbances:none  Narrative Interpretation:   Old EKG Reviewed: changes noted  CRITICAL CARE Performed by: Silverio Lay, Kassie Keng   Total critical care time: 30 min   Critical care time was exclusive of separately billable procedures and treating other patients.  Critical care was necessary to treat or prevent imminent or life-threatening deterioration.  Critical care was time spent personally by me on the following activities: development of treatment plan with patient and/or surrogate as well as nursing, discussions with consultants, evaluation of patient's response to treatment, examination of patient, obtaining history from patient or surrogate, ordering and performing treatments and interventions, ordering and review of laboratory studies, ordering and review of radiographic studies, pulse oximetry and re-evaluation of patient's condition.   MDM  No diagnosis found. Claudia [name] is a 79 y.o. female here with chest pain. There are new TWI inferior and laterally. Concerned for unstable angina. STarted on heparin and nitro drip. I called Dr. Nadara Eaton and he will admit the patient to stepdown.      Richardean Canal, MD 12/02/12 1610  Richardean Canal, MD 12/02/12 [phone]

## 2012-12-02 NOTE — ED Notes (Signed)
Patient brought in by [facility], [facility], on 3 liters via Nasal Cannula, given 2 nitro in transit (315, 326)

## 2012-12-02 NOTE — Progress Notes (Signed)
ANTICOAGULATION CONSULT NOTE - Follow Up Consult  Pharmacy Consult for heparin Indication: chest pain/ACS  Allergies  Allergen Reactions  . Aspirin Other (See Comments)    Intolerant to high doses    Patient Measurements: Weight: ~85kg  Vital Signs: Temp: 98.5 F (36.9 C) (09/28 1615) Temp src: Oral (09/28 1615) BP: 131/51 mmHg (09/28 1615) Pulse Rate: 96 (09/28 0800)  Labs:  Recent Labs  12/02/12 0355 12/02/12 0503 12/02/12 0915 12/02/12 1033 12/02/12 1442  HGB 11.2*  --   --   --   --   HCT 32.1*  --   --   --   --   PLT 206  --   --   --   --   LABPROT  --  12.5  --   --   --   INR  --  0.95  --   --   --   HEPARINUNFRC  --   --   --   --  0.20*  CREATININE 1.42*  --   --   --   --   TROPONINI  --   --  2.92* 6.49* 7.18*    Assessment: 79yo female c/o CP relieved w/ NTG, initial troponin mildly elevated, but currently CP free on heparin and NTG.  Plans for cath tomorrow.    Heparin level is sub therapeutic on 1100 units/hr.  No IV issues or bleeding noted.  Goal of Therapy:  Heparin level 0.3-0.7 units/ml Monitor platelets by anticoagulation protocol: Yes   Plan:  Increase heparin to 1300 units/hr. Recheck heparin level in 6 hours.  [employer], Pharm.D., BCPS Clinical Pharmacist Pager [phone] 12/02/2012 5:39 PM

## 2012-12-02 NOTE — H&P (Signed)
Claudia [name] is an 79 y.o. female.   Chief Complaint: Chest pain HPI: patient is a 79 year old Caucasian female with history of known coronary artery disease and has undergone multiple coronary intervention specifically involving the coronaries of the left system, and she has had recurrent in-stent restenosis involving the circumflex coronary artery and also ramus intermediate ranch of the circumflex coronary artery.  On her previous admission approximately 3 months ago when she presented similarly with NSTEMI, we had contemplated consideration for CABG, however she had had significant progression of native vessel disease and hence underwent repeat angioplasty.  Patient had been doing well until 2 days ago started noticing mild chest discomfort.  However last night she had continuous chest pain associated with nausea and mild diaphoresis which reminded her of for unstable angina and she presented to the emergency room.  She was found to have significant EKG abnormalities in the inferolateral leads hence admitted to the hospital for further management.  Since being on IV heparin and IV nitroglycerin, her chest pain has completely subsided with no further episodes her son is present at the bedside.  Presently denies any shortness breath, PND or orthopnea.  Denies any symptoms of TIA or claudication.  No dark stools, no bloody stools or hematochezia.  No recent weight changes.  Patient states that her diabetes has gotten significantly better compared to previous.  Past Medical History  Diagnosis Date  . GERD (gastroesophageal reflux disease)   . PUD (peptic ulcer disease)   . Anemia   . Hypertension   . S/P endoscopy Aug 2011    3 superficial gastric ulcers, NSAID-induced  . S/P colonoscopy Sept 2011    left-sided diverticula, tubular adenoma  . Coronary artery disease   . Shortness of breath   . Diabetes mellitus     insulin dependent  . Headache(784.0)     rare migraines  . Cancer     hx  of skin cancer  . Arthritis   . Osteopenia   . Hypercholesterolemia   . SIADH (syndrome of inappropriate ADH production)     Past Surgical History  Procedure Laterality Date  . Tonsillectomy    . Appendectomy    . Eye surgery      cataracts  . Cardiac stents  07/19/2011  . Cardiac catheterization  07/19/2011  . Esophagogastroduodenoscopy  10/16/09    normal without barrett's/three superficial gastric ulcers  . Colonoscopy  12/04/09    normal rectum/left-sided diverticula  . Joint replacement  arthroscopy to knee    History reviewed. No pertinent family history. Social History:  reports that she has never smoked. She has never used smokeless tobacco. She reports that she does not drink alcohol or use illicit drugs.  Allergies:  Allergies  Allergen Reactions  . Aspirin Other (See Comments)    Intolerant to high doses    Medications Prior to Admission  Medication Sig Dispense Refill  . aspirin EC 81 MG tablet Take 81 mg by mouth daily.      . carvedilol (COREG) 6.25 MG tablet Take 6.25 mg by mouth 2 (two) times daily with a meal.       . furosemide (LASIX) 40 MG tablet Take 0.5 tablets (20 mg total) by mouth daily as needed.  30 tablet  11  . insulin glargine (LANTUS SOLOSTAR) 100 UNIT/ML injection Inject 20 Units into the skin at bedtime.      . insulin lispro (HUMALOG) 100 UNIT/ML injection Inject 7-10 Units into the skin 3 (three) times  daily before meals.       . isosorbide mononitrate (IMDUR) 60 MG 24 hr tablet Take 1 tablet (60 mg total) by mouth daily.  30 tablet  3  . losartan (COZAAR) 50 MG tablet Take 0.5 tablets (25 mg total) by mouth daily.  30 tablet  11  . Omega-3 Fatty Acids (FISH OIL) 1200 MG CAPS Take 1 capsule by mouth 2 (two) times daily.       Marland Kitchen omeprazole (PRILOSEC) 20 MG capsule Take 20 mg by mouth 2 (two) times daily.       . prasugrel (EFFIENT) 10 MG TABS Take 1 tablet (10 mg total) by mouth daily.  30 tablet  0  . simvastatin (ZOCOR) 20 MG tablet Take 1  tablet (20 mg total) by mouth every evening.  90 tablet  1  . B-D ULTRAFINE III SHORT PEN 31G X 8 MM MISC USE AS DIRECTED WITH LANTUS PEN  100 each  5  . meclizine (ANTIVERT) 25 MG tablet Take 25 mg by mouth every 4 (four) hours as needed.      . nitroGLYCERIN (NITROSTAT) 0.4 MG SL tablet Place 0.4 mg under the tongue every 5 (five) minutes as needed for chest pain.        Review of Systems - other systems are negative.  Blood pressure 151/62, pulse 96, temperature 98.1 F (36.7 C), temperature source Oral, resp. rate 19, height 5\' 7"  (1.702 m), weight 89.4 kg (197 lb 1.5 oz), SpO2 96.00%. General appearance: alert, cooperative, no distress and mildly obese Eyes: negative findings: lids and lashes normal Neck: no adenopathy, no carotid bruit, no JVD, supple, symmetrical, trachea midline and thyroid not enlarged, symmetric, no tenderness/mass/nodules Neck: JVP - normal, carotids 2+= without bruits Resp: clear to auscultation bilaterally Chest wall: no tenderness Cardio: regular rate and rhythm, S1, S2 normal, no murmur, click, rub or gallop GI: soft, non-tender; bowel sounds normal; no masses,  no organomegaly Extremities: extremities normal, atraumatic, no cyanosis or edema Pulses: 2+ and symmetric Skin: Skin color, texture, turgor normal. No rashes or lesions Neurologic: Grossly normal  Results for orders placed during the hospital encounter of 12/02/12 (from the past 48 hour(s))  CBC WITH DIFFERENTIAL     Status: Abnormal   Collection Time    12/02/12  3:55 AM      Result Value Range   WBC 8.0  4.0 - 10.5 K/uL   RBC 3.64 (*) 3.87 - 5.11 MIL/uL   Hemoglobin 11.2 (*) 12.0 - 15.0 g/dL   HCT 62.1 (*) 30.8 - 65.7 %   MCV 88.2  78.0 - 100.0 fL   MCH 30.8  26.0 - 34.0 pg   MCHC 34.9  30.0 - 36.0 g/dL   RDW 84.6  96.2 - 95.2 %   Platelets 206  150 - 400 K/uL   Neutrophils Relative % 75  43 - 77 %   Neutro Abs 6.0  1.7 - 7.7 K/uL   Lymphocytes Relative 16  12 - 46 %   Lymphs Abs 1.3   0.7 - 4.0 K/uL   Monocytes Relative 6  3 - 12 %   Monocytes Absolute 0.5  0.1 - 1.0 K/uL   Eosinophils Relative 2  0 - 5 %   Eosinophils Absolute 0.2  0.0 - 0.7 K/uL   Basophils Relative 0  0 - 1 %   Basophils Absolute 0.0  0.0 - 0.1 K/uL  COMPREHENSIVE METABOLIC PANEL     Status: Abnormal   Collection Time  12/02/12  3:55 AM      Result Value Range   Sodium 128 (*) 135 - 145 mEq/L   Potassium 5.0  3.5 - 5.1 mEq/L   Chloride 95 (*) 96 - 112 mEq/L   CO2 21  19 - 32 mEq/L   Glucose, Bld 292 (*) 70 - 99 mg/dL   BUN 39 (*) 6 - 23 mg/dL   Creatinine, Ser 1.61 (*) 0.50 - 1.10 mg/dL   Calcium 8.9  8.4 - 09.6 mg/dL   Total Protein 7.0  6.0 - 8.3 g/dL   Albumin 3.8  3.5 - 5.2 g/dL   AST 18  0 - 37 U/L   ALT 11  0 - 35 U/L   Alkaline Phosphatase 124 (*) 39 - 117 U/L   Total Bilirubin 0.5  0.3 - 1.2 mg/dL   GFR calc non Af Amer 34 (*) >90 mL/min   GFR calc Af Amer 40 (*) >90 mL/min   Comment: (NOTE)     The eGFR has been calculated using the CKD EPI equation.     This calculation has not been validated in all clinical situations.     eGFR's persistently <90 mL/min signify possible Chronic Kidney     Disease.  PRO B NATRIURETIC PEPTIDE     Status: Abnormal   Collection Time    12/02/12  3:55 AM      Result Value Range   Pro B Natriuretic peptide (BNP) 3295.0 (*) 0 - 450 pg/mL  POCT I-STAT TROPONIN I     Status: None   Collection Time    12/02/12  4:25 AM      Result Value Range   Troponin i, poc 0.06  0.00 - 0.08 ng/mL   Comment 3            Comment: Due to the release kinetics of cTnI,     a negative result within the first hours     of the onset of symptoms does not rule out     myocardial infarction with certainty.     If myocardial infarction is still suspected,     repeat the test at appropriate intervals.  URINALYSIS, ROUTINE W REFLEX MICROSCOPIC     Status: Abnormal   Collection Time    12/02/12  4:46 AM      Result Value Range   Color, Urine YELLOW  YELLOW    APPearance CLOUDY (*) CLEAR   Specific Gravity, Urine 1.011  1.005 - 1.030   pH 5.5  5.0 - 8.0   Glucose, UA >1000 (*) NEGATIVE mg/dL   Hgb urine dipstick TRACE (*) NEGATIVE   Bilirubin Urine NEGATIVE  NEGATIVE   Ketones, ur NEGATIVE  NEGATIVE mg/dL   Protein, ur 30 (*) NEGATIVE mg/dL   Urobilinogen, UA 0.2  0.0 - 1.0 mg/dL   Nitrite NEGATIVE  NEGATIVE   Leukocytes, UA TRACE (*) NEGATIVE  URINE MICROSCOPIC-ADD ON     Status: Abnormal   Collection Time    12/02/12  4:46 AM      Result Value Range   Squamous Epithelial / LPF RARE  RARE   WBC, UA 7-10  <3 WBC/hpf   RBC / HPF 3-6  <3 RBC/hpf   Bacteria, UA FEW (*) RARE   Casts HYALINE CASTS (*) NEGATIVE  PROTIME-INR     Status: None   Collection Time    12/02/12  5:03 AM      Result Value Range   Prothrombin Time 12.5  11.6 -  15.2 seconds   INR 0.95  0.00 - 1.49  MRSA PCR SCREENING     Status: None   Collection Time    12/02/12  6:35 AM      Result Value Range   MRSA by PCR NEGATIVE  NEGATIVE   Comment:            The GeneXpert MRSA Assay (FDA     approved for NASAL specimens     only), is one component of a     comprehensive MRSA colonization     surveillance program. It is not     intended to diagnose MRSA     infection nor to guide or     monitor treatment for     MRSA infections.  GLUCOSE, CAPILLARY     Status: Abnormal   Collection Time    12/02/12  7:35 AM      Result Value Range   Glucose-Capillary 283 (*) 70 - 99 mg/dL  TROPONIN I     Status: Abnormal   Collection Time    12/02/12  9:15 AM      Result Value Range   Troponin I 2.92 (*) <0.30 ng/mL   Comment:            Due to the release kinetics of cTnI,     a negative result within the first hours     of the onset of symptoms does not rule out     myocardial infarction with certainty.     If myocardial infarction is still suspected,     repeat the test at appropriate intervals.     REPEATED TO VERIFY     CRITICAL VALUE NOTED.  VALUE IS CONSISTENT WITH  PREVIOUSLY REPORTED AND CALLED VALUE.  TROPONIN I     Status: Abnormal   Collection Time    12/02/12 10:33 AM      Result Value Range   Troponin I 6.49 (*) <0.30 ng/mL   Comment:            Due to the release kinetics of cTnI,     a negative result within the first hours     of the onset of symptoms does not rule out     myocardial infarction with certainty.     If myocardial infarction is still suspected,     repeat the test at appropriate intervals.     REPEATED TO VERIFY     CRITICAL VALUE NOTED.  VALUE IS CONSISTENT WITH PREVIOUSLY REPORTED AND CALLED VALUE.  GLUCOSE, CAPILLARY     Status: Abnormal   Collection Time    12/02/12 11:43 AM      Result Value Range   Glucose-Capillary 271 (*) 70 - 99 mg/dL   Dg Chest Port 1 View  12/02/2012   CLINICAL DATA:  Chest pain and gastroesophageal reflux disease. Hypertension and diabetes.  EXAM: PORTABLE CHEST - 1 VIEW  COMPARISON:  09/02/2012 and 06/01/2012  FINDINGS: Midline trachea. Normal heart size with atherosclerosis in the transverse aorta. No left and no definite right pleural effusion. No pneumothorax. Interstitial thickening is moderate and similar to 09/02/2012. Increased since 06/01/2012. No lobar consolidation.  IMPRESSION: Persistent moderate interstitial thickening, suspicious for mild interstitial edema.   Electronically Signed   By: Jeronimo Greaves   On: 12/02/2012 04:27    Labs:   Lab Results  Component Value Date   WBC 8.0 12/02/2012   HGB 11.2* 12/02/2012   HCT 32.1* 12/02/2012   MCV 88.2 12/02/2012  PLT 206 12/02/2012    Recent Labs Lab 12/02/12 0355  NA 128*  K 5.0  CL 95*  CO2 21  BUN 39*  CREATININE 1.42*  CALCIUM 8.9  PROT 7.0  BILITOT 0.5  ALKPHOS 124*  ALT 11  AST 18  GLUCOSE 292*   Lab Results  Component Value Date   CKTOTAL 309* 09/03/2012   CKMB 16.7* 09/03/2012   TROPONINI 6.49* 12/02/2012    Lipid Panel     Component Value Date/Time   CHOL 143 07/02/2012 1016   TRIG 33 07/02/2012 1016   HDL  78 07/02/2012 1016   CHOLHDL 1.8 07/02/2012 1016   VLDL 7 07/02/2012 1016   LDLCALC 58 07/02/2012 1016    EKG:  EKG 12/02/2012: Normal sinus rhythm at the rate of 91 bpm, left atrial abnormality, inferior and lateral ST segment depression suggestive of subendocardial ischemia versus infarct.   Assessment/Plan 1.  Non-ST elevation myocardial infarction, suspect in-stent restenosis in the circumflex and ramus intermediate vessels  Known CAD.  1) H/O Prox/Mid Circumflex 2.5x25 Promus , ramus intermediate 2.5x26 Resolute DES stent placed on 07/19/11.  2). PTCA and scoring balloon PTCA of the Ramus intermediate and circumflex in-stent restenosis on 12/21/2011 with 2.5 x 10 mm angiosculpt balloon.  3). 02/20/2012 stenting of the circumflex coronary artery mid and proximal segment with implantation of a 2.5 x 32 mm Promus premier drug-eluting stent for recurrent in-stent restenosis  4). 09/03/2012: Stenting of the ostial circumflex coronary artery with  2.5x12 Xience DES.  Successful balloon angioplasty of the occluded ramus intermediate. Stenosis reduced from 1 or percent to less than 30%.  Successful PTCA and balloon angioplasty of the in-stent restenotic midsegment of the circumflex coronary artery.  2.  Hypertension 3.  Hyperlipidemia 4.  Diabetes mellitus type 2 uncontrolled  Recommendation: Patient presently is asymptomatic and hemodynamically stable.  We will continue present medical therapy, set her up for coronary angiography tomorrow.  Unless she has recurrence of chest pain we will watch her overnight.  I have discussed extensively with the patient and her son at the bedside, although CABG is an option, she had severe diffuse disease involving the circumflex coronary artery hence either medical management or repeating angioplasty may be the best option.  Continued secondary prevention, risk management is indicated.  Patient previously has had uncontrolled hypertension.  We will recheck her  HbA1c.  Patient and her son understand the risks, benefits and alternatives for coronary angiography including but not limited to less than 1% risk of death, stroke, MI, need for urgent CABG, bleeding, infection but not limited to these.  [name], MD 12/02/2012, 2:41 PM [facility] Pager: [phone] Office: [phone] If no answer: Cell:  [phone]

## 2012-12-02 NOTE — ED Notes (Signed)
Patient came in with chest pain via [facility], PT got nitro in route both relieved pain, BP 170/84, pain started last night around 10pm

## 2012-12-02 NOTE — Progress Notes (Signed)
Utilization Review completed.  

## 2012-12-02 NOTE — Progress Notes (Signed)
ANTICOAGULATION CONSULT NOTE - Follow Up Consult  Pharmacy Consult for heparin Indication: chest pain/ACS  Allergies  Allergen Reactions  . Aspirin Other (See Comments)    Intolerant to high doses    Patient Measurements: Weight: ~85kg  Vital Signs: Temp: 97.7 F (36.5 C) (09/28 0357) Temp src: Oral (09/28 0357) BP: 158/65 mmHg (09/28 0445) Pulse Rate: 86 (09/28 0445)  Labs:  Recent Labs  12/02/12 0355  HGB 11.2*  HCT 32.1*  PLT 206  CREATININE 1.42*    Assessment: 79yo female c/o CP relieved w/ NTG, initial troponin mildly elevated, to begin heparin.  Goal of Therapy:  Heparin level 0.3-0.7 units/ml Monitor platelets by anticoagulation protocol: Yes   Plan:  Will give heparin 4000 units IV bolus then begin heparin gtt at 1100 units/hr and monitor heparin levels and CBC.  Vernard Gambles, PharmD, BCPS  12/02/2012,5:06 AM

## 2012-12-03 ENCOUNTER — Encounter (HOSPITAL_COMMUNITY)
Admission: EM | Disposition: A | Discharge status: Home Health | Payer: Self-pay | Source: Home / Self Care | Attending: Cardiology

## 2012-12-03 LAB — GLUCOSE, CAPILLARY: Glucose-Capillary: 185 mg/dL — ABNORMAL HIGH (ref 70–99)

## 2012-12-03 LAB — CBC
HCT: 26.7 % — ABNORMAL LOW (ref 36.0–46.0)
HCT: 24.6 % — ABNORMAL LOW (ref 36.0–46.0)
Hemoglobin: 9.4 g/dL — ABNORMAL LOW (ref 12.0–15.0)
Hemoglobin: 8.5 g/dL — ABNORMAL LOW (ref 12.0–15.0)
Hemoglobin: 10.1 g/dL — ABNORMAL LOW (ref 12.0–15.0)
MCH: 31.2 pg (ref 26.0–34.0)
MCHC: 34.6 g/dL (ref 30.0–36.0)
MCHC: 35.2 g/dL (ref 30.0–36.0)
MCV: 87.3 fL (ref 78.0–100.0)
MCV: 88.8 fL (ref 78.0–100.0)
MCV: 88.6 fL (ref 78.0–100.0)
Platelets: 164 10*3/uL (ref 150–400)
RBC: 3.06 MIL/uL — ABNORMAL LOW (ref 3.87–5.11)
RBC: 3.24 MIL/uL — ABNORMAL LOW (ref 3.87–5.11)
RDW: 12.8 % (ref 11.5–15.5)
WBC: 7.1 10*3/uL (ref 4.0–10.5)
WBC: 4.8 10*3/uL (ref 4.0–10.5)

## 2012-12-03 LAB — LIPID PANEL
Cholesterol: 125 mg/dL (ref 0–200)
HDL: 72 mg/dL (ref >39)
Total CHOL/HDL Ratio: 1.7 RATIO
Triglycerides: 42 mg/dL (ref <150)

## 2012-12-03 LAB — HEMOGLOBIN A1C: Mean Plasma Glucose: 174 mg/dL — ABNORMAL HIGH (ref <117)

## 2012-12-03 LAB — URINE CULTURE

## 2012-12-03 LAB — HEPARIN LEVEL (UNFRACTIONATED): Heparin Unfractionated: 0.41 IU/mL (ref 0.30–0.70)

## 2012-12-03 SURGERY — LEFT HEART CATHETERIZATION WITH CORONARY ANGIOGRAM
Anesthesia: LOCAL

## 2012-12-03 MED ORDER — SODIUM CHLORIDE 0.9 % IV SOLN: 250.0000 mL | INTRAVENOUS | Status: DC | PRN

## 2012-12-03 MED ORDER — SODIUM CHLORIDE 0.9 % IJ SOLN
3.0000 mL | Freq: Two times a day (BID) | INTRAMUSCULAR | Status: DC
  Administered 2012-12-03 – 2012-12-04 (×2): 3 mL via INTRAVENOUS

## 2012-12-03 MED ORDER — FENTANYL CITRATE 0.05 MG/ML IJ SOLN
50.0000 ug | INTRAMUSCULAR | Status: DC | PRN
  Administered 2012-12-03: 50 ug via INTRAVENOUS
  Filled 2012-12-03: qty 2

## 2012-12-03 MED ORDER — SODIUM CHLORIDE 0.9 % IV SOLN: 1.0000 mL/kg/h | INTRAVENOUS | Status: DC

## 2012-12-03 MED ORDER — SODIUM CHLORIDE 0.9 % IJ SOLN: 3.0000 mL | INTRAMUSCULAR | Status: DC | PRN

## 2012-12-03 NOTE — Progress Notes (Signed)
Notified Dr Jacinto Halim of repeat CBC, hgb now 8.5, new orders to d/c heparin and cancel cath for today.

## 2012-12-03 NOTE — Progress Notes (Signed)
Dr. Jacinto Halim notified of pt continued chest discomfort unrelieved by increasing nitroglycerin.   Stool hemoccult result negative. Orders received. Will continue to monitor pt closely.

## 2012-12-03 NOTE — Progress Notes (Signed)
Patient evaluated for community based chronic disease management services with [facility] Care Management Program as a benefit of patient's [employer]. Spoke with patient and son, Hodan Wurtz at bedside to explain [facility] Care Management services.  Patient has had some difficulty managing her medications expense.  Average monthly cost is $250-$300.  She indicated that her insulin is one of the most significant cost.  Has gone into the doughnut hole on more than one occasion.  Will provide a pharmacy consult post discharge and a LCSW for community resource assessment.  Asked son to consider a home delivery pharmacy in [facility] as patient intermittently can not drive due to illness. 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  Patient will receive a post discharge transition of care call and will be evaluated for monthly home visits for assessments and CAD disease process education.  Left contact information and [facility] literature at bedside. Made Junius Creamer RN Inpatient Case Manager aware that [facility] Care Management following. Of note, [facility] Care Management services does not replace or interfere with any services that are arranged by inpatient case management or social work.  For additional questions or referrals please contact Anibal Henderson BSN RN [facility] Liaison at [phone].

## 2012-12-03 NOTE — Progress Notes (Signed)
Dr. Jacinto Halim notified of  CBC results, hgb now 8.5.  Will continue to monitor pt closely.

## 2012-12-03 NOTE — Progress Notes (Signed)
Subjective:  No further chest pain. Feels well.  Objective:  Vital Signs in the last 24 hours: Temp:  [98.1 F (36.7 C)-98.8 F (37.1 C)] 98.1 F (36.7 C) (09/29 0806) Pulse Rate:  [64-89] 64 (09/29 0806) Resp:  [14-21] 16 (09/29 0806) BP: (111-148)/(34-54) 120/42 mmHg (09/29 0806) SpO2:  [92 %-99 %] 96 % (09/29 0806)  Intake/Output from previous day: 09/28 0701 - 09/29 0700 In: 1557.8 [P.O.:180; I.V.:1377.8] Out: 2175 [Urine:2175]  Physical Exam: General appearance: alert, cooperative, no distress and mildly obese  Eyes: negative findings: lids and lashes normal  Neck: no adenopathy, no carotid bruit, no JVD, supple, symmetrical, trachea midline and thyroid not enlarged, symmetric, no tenderness/mass/nodules  Neck: JVP - normal, carotids 2+= without bruits  Resp: clear to auscultation bilaterally  Chest wall: no tenderness  Cardio: regular rate and rhythm, S1, S2 normal, no murmur, click, rub or gallop  GI: soft, non-tender; bowel sounds normal; no masses, no organomegaly  Extremities: extremities normal, atraumatic, no cyanosis or edema  Pulses: 2+ and symmetric  Skin: Skin color, texture, turgor normal. No rashes or lesions  Neurologic: Grossly normal   Lab Results:  Recent Labs  12/02/12 0355 12/03/12 0355  WBC 8.0 7.1  HGB 11.2* 9.4*  PLT 206 183    Recent Labs  12/02/12 0355  NA 128*  K 5.0  CL 95*  CO2 21  GLUCOSE 292*  BUN 39*  CREATININE 1.42*   Cardiac Panel (last 3 results)  Recent Labs  12/02/12 1033 12/02/12 1442 12/02/12 2055  TROPONINI 6.49* 7.18* 8.95*      Recent Labs  12/02/12 0355  PROT 7.0  ALBUMIN 3.8  AST 18  ALT 11  ALKPHOS 124*  BILITOT 0.5   Lipid Panel     Component Value Date/Time   CHOL 125 12/03/2012 0355   TRIG 42 12/03/2012 0355   HDL 72 12/03/2012 0355   CHOLHDL 1.7 12/03/2012 0355   VLDL 8 12/03/2012 0355   LDLCALC 45 12/03/2012 0355    EKG: normal sinus rhythm @ 80/min. Normal intervals. LVH. Inferior,  lateral ST depression suggests sub endocardial ischemia vs infarct. No change from admission EKG, ST depression may be improved slightly.   Assessment/Plan:  1.  Non-ST elevation myocardial infarction involving the circumflex and or ramus intermediate branch of the circumflex coronary artery which is had multiple instent restenosis. 2.  Anemia, normal indicis, probably anemia of chronic disease, however patient dropped her hemoglobin to 8.5 g this morning leading to cancellation of coronary angiography.  However repeat CBC reveals fairly stable hemoglobin.  Patient may be stable for coronary angiography in the morning.  Heparin has been discontinued for now.  Discussed with the patient regarding her dropped hemoglobin. 3.  Diabetes mellitus type 2 uncontrolled with a hemoglobin A1c of 7.5 g, however this is much better compared to prior hemoglobin A1c. 4.  Hypertension 5.  Hyperlipidemia   [name], M.D. 12/03/2012, 8:52 AM [facility] Pager: [phone] Office: [phone] If no answer: [phone]

## 2012-12-03 NOTE — Progress Notes (Signed)
Dr. Jacinto Halim notified of hemoglobin 8.5 with this last lab draw.  Fluids now at keep open rate since cardiac cath now postponed.  Also notified of pt complaining of chest discomfort, pain  1-2 out of 10. Will continue to monitor pt closely.

## 2012-12-03 NOTE — Care Management Note (Addendum)
    Page 1 of 2   12/05/2012     11:44:46 AM   CARE MANAGEMENT NOTE 12/05/2012  Patient:  Claudia [name], Claudia [name]   Account Number:  [id number]  Date Initiated:  12/03/2012  Documentation initiated by:  Junius Creamer  Subjective/Objective Assessment:   adm w angina     Action/Plan:   lives w fam, pcp dr Broadus John pickard   Anticipated DC Date:  12/05/2012   Anticipated DC Plan:  HOME W HOME HEALTH SERVICES      DC Planning Services  CM consult      Choice offered to / List presented to:     DME arranged  [facility]      DME agency  [facility]     HH arranged  HH-1 RN      [facility] agency  [facility]   Status of service:   Medicare Important Message given?   (If response is "NO", the following Medicare IM given date fields will be blank) Date Medicare IM given:   Date Additional Medicare IM given:    Discharge Disposition:  HOME W HOME HEALTH SERVICES  Per UR Regulation:  Reviewed for med. necessity/level of care/duration of stay  If discussed at Long Length of Stay Meetings, dates discussed:    Comments:  9/30 1157a debbie Miquan Tandon rn,bsn [facility] ([facility]) rn will ck on pt once disch. will cont to [facility]. 10/1 ordered rw w [facility] since they have [employer] medic[employer] contract for eq.pt would like eq del to home. debbie Orlo Brickle rn,bsn

## 2012-12-03 NOTE — Progress Notes (Signed)
ANTICOAGULATION CONSULT NOTE - Follow Up Consult  Pharmacy Consult for heparin Indication: NSTEMI  Labs:  Recent Labs  12/02/12 0355 12/02/12 0503  12/02/12 1033 12/02/12 1442 12/02/12 2055 12/03/12  HGB 11.2*  --   --   --   --   --   --   HCT 32.1*  --   --   --   --   --   --   PLT 206  --   --   --   --   --   --   LABPROT  --  12.5  --   --   --   --   --   INR  --  0.95  --   --   --   --   --   HEPARINUNFRC  --   --   --   --  0.20*  --  0.30  CREATININE 1.42*  --   --   --   --   --   --   TROPONINI  --   --   < > 6.49* 7.18* 8.95*  --   < > = values in this interval not displayed.   Assessment/Plan:  79yo female now therapeutic on heparin after rate increase.  Will continue gtt at current rate and confirm stable with am labs.  Vernard Gambles, PharmD, BCPS  12/03/2012,12:28 AM

## 2012-12-04 ENCOUNTER — Encounter (HOSPITAL_COMMUNITY)
Admission: EM | Disposition: A | Discharge status: Home Health | Payer: Medicare HMO | Source: Home / Self Care | Attending: Cardiology

## 2012-12-04 HISTORY — PX: LEFT HEART CATHETERIZATION WITH CORONARY ANGIOGRAM: SHX5451

## 2012-12-04 LAB — BASIC METABOLIC PANEL
CO2: 24 mEq/L (ref 19–32)
Calcium: 8.5 mg/dL (ref 8.4–10.5)
Creatinine, Ser: 1.17 mg/dL — ABNORMAL HIGH (ref 0.50–1.10)
GFR calc non Af Amer: 43 mL/min — ABNORMAL LOW (ref >90)
Glucose, Bld: 186 mg/dL — ABNORMAL HIGH (ref 70–99)

## 2012-12-04 LAB — CBC
Hemoglobin: 9.3 g/dL — ABNORMAL LOW (ref 12.0–15.0)
MCH: 31.2 pg (ref 26.0–34.0)
MCHC: 35.1 g/dL (ref 30.0–36.0)
MCV: 88.9 fL (ref 78.0–100.0)
Platelets: 167 10*3/uL (ref 150–400)
RBC: 2.98 MIL/uL — ABNORMAL LOW (ref 3.87–5.11)

## 2012-12-04 SURGERY — LEFT HEART CATHETERIZATION WITH CORONARY ANGIOGRAM
Anesthesia: LOCAL

## 2012-12-04 MED ORDER — SODIUM CHLORIDE 0.9 % IV SOLN: 250.0000 mL | INTRAVENOUS | Status: DC | PRN

## 2012-12-04 MED ORDER — FENTANYL CITRATE 0.05 MG/ML IJ SOLN
INTRAMUSCULAR | Status: AC
  Filled 2012-12-04: qty 2

## 2012-12-04 MED ORDER — SODIUM CHLORIDE 0.9 % IV SOLN: 1.0000 mL/kg/h | INTRAVENOUS | Status: AC

## 2012-12-04 MED ORDER — NITROGLYCERIN 0.2 MG/ML ON CALL CATH LAB
INTRAVENOUS | Status: AC
  Filled 2012-12-04: qty 1

## 2012-12-04 MED ORDER — LIDOCAINE HCL (PF) 1 % IJ SOLN
INTRAMUSCULAR | Status: AC
  Filled 2012-12-04: qty 30

## 2012-12-04 MED ORDER — HEPARIN (PORCINE) IN NACL 2-0.9 UNIT/ML-% IJ SOLN
INTRAMUSCULAR | Status: AC
  Filled 2012-12-04: qty 1000

## 2012-12-04 MED ORDER — NITROGLYCERIN IN D5W 200-5 MCG/ML-% IV SOLN
INTRAVENOUS | Status: AC
  Filled 2012-12-04: qty 250

## 2012-12-04 MED ORDER — MIDAZOLAM HCL 2 MG/2ML IJ SOLN
INTRAMUSCULAR | Status: AC
  Filled 2012-12-04: qty 2

## 2012-12-04 MED ORDER — SODIUM CHLORIDE 0.9 % IJ SOLN: 3.0000 mL | INTRAMUSCULAR | Status: DC | PRN

## 2012-12-04 MED ORDER — ACETAMINOPHEN 325 MG PO TABS: 650.0000 mg | ORAL_TABLET | ORAL | Status: DC | PRN

## 2012-12-04 MED ORDER — ONDANSETRON HCL 4 MG/2ML IJ SOLN: 4.0000 mg | Freq: Four times a day (QID) | INTRAMUSCULAR | Status: DC | PRN

## 2012-12-04 MED ORDER — HEPARIN (PORCINE) IN NACL 2-0.9 UNIT/ML-% IJ SOLN
INTRAMUSCULAR | Status: AC
  Filled 2012-12-04: qty 1500

## 2012-12-04 MED ORDER — BIVALIRUDIN 250 MG IV SOLR
INTRAVENOUS | Status: AC
  Filled 2012-12-04: qty 250

## 2012-12-04 MED ORDER — SODIUM CHLORIDE 0.9 % IJ SOLN
3.0000 mL | Freq: Two times a day (BID) | INTRAMUSCULAR | Status: DC
  Administered 2012-12-05: 3 mL via INTRAVENOUS

## 2012-12-04 NOTE — Progress Notes (Signed)
Subjective:  No further chest pain. Feels well. States she received Fentanyl last night and slept through the night. No problems this morning  Objective:  Vital Signs in the last 24 hours: Temp:  [97.4 F (36.3 C)-99.1 F (37.3 C)] 98.4 F (36.9 C) (09/30 0744) Pulse Rate:  [54-94] 61 (09/30 0744) Resp:  [9-17] 9 (09/30 0744) BP: (94-175)/(28-70) 109/33 mmHg (09/30 0744) SpO2:  [95 %-100 %] 100 % (09/30 0744)  Intake/Output from previous day: 09/29 0701 - 09/30 0700 In: 2881.2 [P.O.:840; I.V.:2041.2] Out: 2575 [Urine:2575]  Physical Exam: General appearance: alert, cooperative, no distress and mildly obese  Eyes: negative findings: lids and lashes normal  Neck: no adenopathy, no carotid bruit, no JVD, supple, symmetrical, trachea midline and thyroid not enlarged, symmetric, no tenderness/mass/nodules  Neck: JVP - normal, carotids 2+= without bruits  Resp: clear to auscultation bilaterally  Chest wall: no tenderness  Cardio: regular rate and rhythm, S1, S2 normal, no murmur, click, rub or gallop  GI: soft, non-tender; bowel sounds normal; no masses, no organomegaly  Extremities: extremities normal, atraumatic, no cyanosis or edema  Pulses: 2+ and symmetric  Skin: Skin color, texture, turgor normal. No rashes or lesions  Neurologic: Grossly normal   Lab Results:  Recent Labs  12/03/12 1620 12/04/12 0500  WBC 4.9 5.5  HGB 10.1* 9.3*  PLT 164 167    Recent Labs  12/02/12 0355 12/04/12 0200  NA 128* 131*  K 5.0 4.8  CL 95* 100  CO2 21 24  GLUCOSE 292* 186*  BUN 39* 25*  CREATININE 1.42* 1.17*   Cardiac Panel (last 3 results)  Recent Labs  12/02/12 1033 12/02/12 1442 12/02/12 2055  TROPONINI 6.49* 7.18* 8.95*      Recent Labs  12/02/12 0355  PROT 7.0  ALBUMIN 3.8  AST 18  ALT 11  ALKPHOS 124*  BILITOT 0.5   Lipid Panel     Component Value Date/Time   CHOL 125 12/03/2012 0355   TRIG 42 12/03/2012 0355   HDL 72 12/03/2012 0355   CHOLHDL 1.7  12/03/2012 0355   VLDL 8 12/03/2012 0355   LDLCALC 45 12/03/2012 0355    EKG: normal sinus rhythm @ 80/min. Normal intervals. LVH. Inferior, lateral ST depression suggests sub endocardial ischemia vs infarct. No change from admission EKG, ST depression may be improved slightly.   Assessment/Plan:  1.  Non-ST elevation myocardial infarction involving the circumflex and or ramus intermediate branch of the circumflex coronary artery which is had multiple instent restenosis. 2.  Anemia, normal indicis, probably anemia of chronic disease, however patient dropped her hemoglobin to 8.5 g this morning leading to cancellation of coronary angiography.  However repeat CBC reveals fairly stable hemoglobin.  Patient may be stable for coronary angiography today.   Discussed with the patient regarding her dropped hemoglobin. 3.  Diabetes mellitus type 2 uncontrolled with a hemoglobin A1c of 7.5 g, however this is much better compared to prior hemoglobin A1c. 4.  Hypertension 5.  Hyperlipidemia   Kambree Krauss,JAGADEESH R, M.D. 12/04/2012, 8:55 AM [facility] Pager: [phone] Office: [phone] If no answer: [phone]  

## 2012-12-04 NOTE — CV Procedure (Signed)
Left heart catheterization including hemodynamic monitoring of the left ventricle, selective right and left coronary arteriography. PTCA and stenting of the ostial circumflex coronary artery with implantation of a 2.75 x 12 mm Promus premier drug-eluting stent into the ostial circumflex coronary artery. Performed: Femoral arteriogram and closure of the femoral arterial access with Perclose.   Indication: Patient is a 79 year-old female with history of hypertension, hyperlipidemia, Diabetes Mellitus who presents with NSTEMI. Patient has  1) H/O Prox/Mid Circumflex 2.5x25 Promus , ramus intermediate 2.5x26 Resolute DES stent placed on 07/19/11.  2). PTCA and scoring balloon PTCA of the Ramus intermediate and circumflex in-stent restenosis on 12/21/2011 with 2.5 x 10 mm angiosculpt balloon.  3) 02/20/2012 stenting of the circumflex coronary artery mid and proximal segment with implantation of a 2.5 x 32 mm Promus premier drug-eluting stent for recurrent in-stent restenosis. 4) 2.5 x 12 mm Xience drug-eluting stent into the proximal segment of the circumflex coronary artery for in-stent restenosis on 09/03/2012. She had occluded RI at the time which was balloon angioplasty performed.  She presented again with lateral ST segment depression with markedly elevated cardiac markers for NSTEMI. Hence is brought to the cardiac catheterization lab to reevaluate her coronary anatomy.   Hemodynamic data:  Aortic pressure was 143/62/97 with a mean of 78 mm mercury. LV 150/14, EDP 24 mm Hg. No PG across the AV.     Right coronary artery: Is dominant, with a high-grade calcific 99% ostial stenosis. A small caliber vessel. Diffusely diseased. Has collaterals from the left system.  Left main coronary artery is large with mild diffuse calcification.   Circumflex coronary artery: A large vessel giving origin to a small obtuse marginal 1. There is  in-stent restenosis of 95% and also the stenosis extends proximal to the  stent very close to the ostial circumflex. Mid circumflex artery stents are widely patent with mild neointimal hyperplasia.  LAD: LAD gives origin to a large diagonal-1. LAD has mild luminal irregularities.   Ramus intermediate: The vessel is occluded in the midsegment, severely diffusely diseased and probably measures about 1.5 mm. There is in-stent restenosis throughout the stented segment. The occlusion appears to be in the stent. Is also supplied by collaterals from the left system.  Interventional data: Successful PTCA and stenting of the ostial circumflex coronary artery with implantation of a 2.75x12 Promus Premier DES. Stenosis reduced from 95% to 0% with TIMI-3 to TIMI-3 flow maintained at the end of the procedure.    Will need Dual antiplatelet therapy with Effient and ASA 81 mg for at least one more year.   Technique: Under sterile precautions using a 6 French right femoral arterial access, a 6 French sheath was introduced into the right femoral artery under fluoroscopy guidance. A 5 [place] multipurpose B2 catheter was advanced into the ascending aorta and then into the left ventricle. Hemodynamics are analysed. Catheter pulled into the ascending aorta and right  coronary artery was cannulated and angiography was performed Then a 5 French Judkins left 4 diagnostic was utilized to engage left main coronary artery and angiography was repeated. Catheter exchanged out of the body over J-Wire. NO immediate complications noted. Patient tolerated the procedure well.   Technique of intervention:  Using a 6 [place] XB 3.5  guide catheter the left main coronary was selected and cannulated. Using Angiomax for anticoagulation, I utilized a Runthrough guidewire and crossed the circumflex coronary artery with moderate amount of difficulty due to high-grade stenosis. This was followed by balloon angioplasty  with a 2.75 x 15 mm [place] balloon. This was followed by stenting with a 2.75 x 12 mm Xience  drug-eluting stent into the ostium of the circumflex coronary artery at 14 atmospheric pressure, for 90 seconds, followed by gently pushing the same stent balloon distally and keeping it within the stent struts A. 18 atmospheric pressure balloon inflation was performed for 45 seconds.   Excellent result was evident. Intracoronary nitroglycerin also administered. Angiography is performed. Results were deemed successful, guidewire was withdrawn guide catheter disengaged and pulled out of the body. Right femoral arteriogram was performed through the arterial access sheath and the access was closed with Perclose.   The diffusely diseased ramus intermediate vessel was still patent at the end of the procedure in spite of being covered by the stent, with TIMI 2 flow, but due to diffuse disease in the ramus intermediate, it was left alone. I suspect circumflex to be the culprit lesion which is very large and gives extensive collaterals to the right coronary artery. Patient had a potential and during balloon inflation into the circumflex coronary artery. A total of 85 cc of contrast was utilized for diagnostic and interventional procedure.

## 2012-12-04 NOTE — H&P (View-Only) (Signed)
Subjective:  No further chest pain. Feels well. States she received Fentanyl last night and slept through the night. No problems this morning  Objective:  Vital Signs in the last 24 hours: Temp:  [97.4 F (36.3 C)-99.1 F (37.3 C)] 98.4 F (36.9 C) (09/30 0744) Pulse Rate:  [54-94] 61 (09/30 0744) Resp:  [9-17] 9 (09/30 0744) BP: (94-175)/(28-70) 109/33 mmHg (09/30 0744) SpO2:  [95 %-100 %] 100 % (09/30 0744)  Intake/Output from previous day: 09/29 0701 - 09/30 0700 In: 2881.2 [P.O.:840; I.V.:2041.2] Out: 2575 [Urine:2575]  Physical Exam: General appearance: alert, cooperative, no distress and mildly obese  Eyes: negative findings: lids and lashes normal  Neck: no adenopathy, no carotid bruit, no JVD, supple, symmetrical, trachea midline and thyroid not enlarged, symmetric, no tenderness/mass/nodules  Neck: JVP - normal, carotids 2+= without bruits  Resp: clear to auscultation bilaterally  Chest wall: no tenderness  Cardio: regular rate and rhythm, S1, S2 normal, no murmur, click, rub or gallop  GI: soft, non-tender; bowel sounds normal; no masses, no organomegaly  Extremities: extremities normal, atraumatic, no cyanosis or edema  Pulses: 2+ and symmetric  Skin: Skin color, texture, turgor normal. No rashes or lesions  Neurologic: Grossly normal   Lab Results:  Recent Labs  12/03/12 1620 12/04/12 0500  WBC 4.9 5.5  HGB 10.1* 9.3*  PLT 164 167    Recent Labs  12/02/12 0355 12/04/12 0200  NA 128* 131*  K 5.0 4.8  CL 95* 100  CO2 21 24  GLUCOSE 292* 186*  BUN 39* 25*  CREATININE 1.42* 1.17*   Cardiac Panel (last 3 results)  Recent Labs  12/02/12 1033 12/02/12 1442 12/02/12 2055  TROPONINI 6.49* 7.18* 8.95*      Recent Labs  12/02/12 0355  PROT 7.0  ALBUMIN 3.8  AST 18  ALT 11  ALKPHOS 124*  BILITOT 0.5   Lipid Panel     Component Value Date/Time   CHOL 125 12/03/2012 0355   TRIG 42 12/03/2012 0355   HDL 72 12/03/2012 0355   CHOLHDL 1.7  12/03/2012 0355   VLDL 8 12/03/2012 0355   LDLCALC 45 12/03/2012 0355    EKG: normal sinus rhythm @ 80/min. Normal intervals. LVH. Inferior, lateral ST depression suggests sub endocardial ischemia vs infarct. No change from admission EKG, ST depression may be improved slightly.   Assessment/Plan:  1.  Non-ST elevation myocardial infarction involving the circumflex and or ramus intermediate branch of the circumflex coronary artery which is had multiple instent restenosis. 2.  Anemia, normal indicis, probably anemia of chronic disease, however patient dropped her hemoglobin to 8.5 g this morning leading to cancellation of coronary angiography.  However repeat CBC reveals fairly stable hemoglobin.  Patient may be stable for coronary angiography today.   Discussed with the patient regarding her dropped hemoglobin. 3.  Diabetes mellitus type 2 uncontrolled with a hemoglobin A1c of 7.5 g, however this is much better compared to prior hemoglobin A1c. 4.  Hypertension 5.  Hyperlipidemia   Claudia [name], M.D. 12/04/2012, 8:55 AM [facility] Pager: [phone] Office: [phone] If no answer: [phone]

## 2012-12-04 NOTE — Progress Notes (Signed)
Pt returned from cath lab at approximately 1855. Pt placed on monitors. No distress or discomfort observed, pt denies pain. Pt placed on bedpan. Pt states understanding of the 4 hour bedrest/leg straight order. Pt's right groin dressing found to be saturated and the site oozing. Pressure held and new pressure dressing applied. Report given to oncoming nurse. Family at bedside.   - Alwyn Ren, RN

## 2012-12-04 NOTE — Interval H&P Note (Signed)
History and Physical Interval Note:  12/04/2012 5:02 PM  Claudia [name]  has presented today for surgery, with the diagnosis of cp  The various methods of treatment have been discussed with the patient and family. After consideration of risks, benefits and other options for treatment, the patient has consented to  Procedure(s): LEFT HEART CATHETERIZATION WITH CORONARY ANGIOGRAM (N/A) and possible PCI as a surgical intervention .  The patient's history has been reviewed, patient examined, no change in status, stable for surgery.  I have reviewed the patient's chart and labs.  Questions were answered to the patient's satisfaction.    Cath Lab Visit (complete for each Cath Lab visit)  Clinical Evaluation Leading to the Procedure:   ACS: yes  Non-ACS:    Anginal Classification: CCS IV  Anti-ischemic medical therapy: Maximal Therapy (2 or more classes of medications)  Non-Invasive Test Results: No non-invasive testing performed  Prior CABG: No previous CABG       [facility]

## 2012-12-04 NOTE — Progress Notes (Addendum)
Entered pts room approximately 1945 patients daughter holding manual pressure on patients groin. Patients daughter pulled pressure dressing of to "see the place to hold pressure". On observation, patients groin oozing moderately and patients daughter was asked to step aside so that pressure could be held by the RN. Manual pressure was held for approximately 20 minutes and the charge RN was asked to assist by continuing to hold pressure. Charge Rn held manual pressure for approximately another 15 minutes for a total of approximately 35 minutes.  Pressure dressing was applied and patient had no complaints. Will continue to monitor for bleeding.

## 2012-12-05 LAB — GLUCOSE, CAPILLARY
Glucose-Capillary: 112 mg/dL — ABNORMAL HIGH (ref 70–99)
Glucose-Capillary: 121 mg/dL — ABNORMAL HIGH (ref 70–99)
Glucose-Capillary: 112 mg/dL — ABNORMAL HIGH (ref 70–99)

## 2012-12-05 LAB — TROPONIN I: Troponin I: 2.66 ng/mL (ref <0.30)

## 2012-12-05 LAB — CBC
Hemoglobin: 10 g/dL — ABNORMAL LOW (ref 12.0–15.0)
MCH: 31 pg (ref 26.0–34.0)
MCHC: 34.5 g/dL (ref 30.0–36.0)
MCV: 89.8 fL (ref 78.0–100.0)
Platelets: 166 10*3/uL (ref 150–400)
RDW: 13 % (ref 11.5–15.5)
WBC: 6.2 10*3/uL (ref 4.0–10.5)

## 2012-12-05 LAB — BASIC METABOLIC PANEL
BUN: 20 mg/dL (ref 6–23)
Calcium: 8.6 mg/dL (ref 8.4–10.5)
Creatinine, Ser: 1.1 mg/dL (ref 0.50–1.10)
GFR calc non Af Amer: 47 mL/min — ABNORMAL LOW (ref >90)
Glucose, Bld: 140 mg/dL — ABNORMAL HIGH (ref 70–99)
Potassium: 4.3 mEq/L (ref 3.5–5.1)
Sodium: 135 mEq/L (ref 135–145)

## 2012-12-05 LAB — HEPARIN LEVEL (UNFRACTIONATED): Heparin Unfractionated: <0.1 IU/mL — ABNORMAL LOW (ref 0.30–0.70)

## 2012-12-05 MED FILL — Sodium Chloride IV Soln 0.9%: INTRAVENOUS | Qty: 50 | Status: AC

## 2012-12-05 NOTE — Discharge Summary (Signed)
Physician Discharge Summary  Patient ID: Claudia [name] MRN: [id number] DOB/AGE: 1932/02/02 79 y.o.  Admit date: 12/02/2012 Discharge date: 12/05/2012  Primary Discharge Diagnosis 1.  Non-ST elevation myocardial infarction 2.  Coronary artery disease of the native vessels  Secondary Discharge Diagnosis Diabetes mellitus type 2 uncontrolled with a hemoglobin A1c of 7.5.  This is much improved compared to prior hemoglobin A1c which have been in double-digit number. Hypertension Hyperlipidemia Normocytic normochromic anemia of chronic disease.  Significant Diagnostic Studies:  Left heart catheterization including hemodynamic monitoring of the left ventricle, selective right and left coronary arteriography. PTCA and stenting of the ostial circumflex coronary artery with implantation of a 2.75 x 12 mm Promus premier drug-eluting stent into the ostial circumflex coronary artery. Performed: Femoral arteriogram and closure of the femoral arterial access with Perclose.   Indication: Patient is a 79 year-old female with history of hypertension, hyperlipidemia, Diabetes Mellitus who presents with NSTEMI. Patient has  1) H/O Prox/Mid Circumflex 2.5x25 Promus , ramus intermediate 2.5x26 Resolute DES stent placed on 07/19/11.  2). PTCA and scoring balloon PTCA of the Ramus intermediate and circumflex in-stent restenosis on 12/21/2011 with 2.5 x 10 mm angiosculpt balloon.  3) 02/20/2012 stenting of the circumflex coronary artery mid and proximal segment with implantation of a 2.5 x 32 mm Promus premier drug-eluting stent for recurrent in-stent restenosis.  4) 2.5 x 12 mm Xience drug-eluting stent into the proximal segment of the circumflex coronary artery for in-stent restenosis on 09/03/2012. She had occluded RI at the time which was balloon angioplasty performed.  She presented again with lateral ST segment depression with markedly elevated cardiac markers for NSTEMI. Hence is brought to the cardiac  catheterization lab to reevaluate her coronary anatomy.  Hemodynamic data:  Aortic pressure was 143/62/97 with a mean of 78 mm mercury. LV 150/14, EDP 24 mm Hg. No PG across the AV.  Right coronary artery: Is dominant, with a high-grade calcific 99% ostial stenosis. A small caliber vessel. Diffusely diseased. Has collaterals from the left system.  Left main coronary artery is large with mild diffuse calcification.  Circumflex coronary artery: A large vessel giving origin to a small obtuse marginal 1. There is in-stent restenosis of 95% and also the stenosis extends proximal to the stent very close to the ostial circumflex. Mid circumflex artery stents are widely patent with mild neointimal hyperplasia.  LAD: LAD gives origin to a large diagonal-1. LAD has mild luminal irregularities.  Ramus intermediate: The vessel is occluded in the midsegment, severely diffusely diseased and probably measures about 1.5 mm. There is in-stent restenosis throughout the stented segment. The occlusion appears to be in the stent. Is also supplied by collaterals from the left system.  Interventional data: Successful PTCA and stenting of the ostial circumflex coronary artery with implantation of a 2.75x12 Promus Premier DES. Stenosis reduced from 95% to 0% with TIMI-3 to TIMI-3 flow maintained at the end of the procedure.  Hospital Course: Patient was admitted to the hospital with non-ST elevation myocardial infarction with significant inferior and lateral ST segment depression.  Suspicion for in-stent restenosis was started about as she had previously done this before.  She was admitted to the stepdown unit, she ruled in for non-ST elevation myocardial infarction.  The following morning she was supposed to have angiography, however her CBC had revealed a dropping hemoglobin, significantly.  Hence the procedure was canceled and CBC was repeated.  The following morning as CBC was stable, she she was felt stable for coronary  angiography, and went repeat coronary angiography and angioplasty.  On the day of discharge was essentially asymptomatic and ambulated with the cardiac rehabilitation without any further angina pectoris.  Hence felt stable for discharge.  Recommendations on discharge: patient will need dual antiplatelet therapy for now.  Previously her anemia workup has been negative for GI source, during hospitalization guaiac was negative for occult blood.  Discharge Exam: Blood pressure 151/46, pulse 73, temperature 99.3 F (37.4 C), temperature source Oral, resp. rate 19, height 5\' 7"  (1.702 m), weight 89.4 kg (197 lb 1.5 oz), SpO2 94.00%.   General appearance: alert, cooperative, no distress and mildly obese  Eyes: negative findings: lids and lashes normal  Neck: no adenopathy, no carotid bruit, no JVD, supple, symmetrical, trachea midline and thyroid not enlarged, symmetric, no tenderness/mass/nodules  Neck: JVP - normal, carotids 2+= without bruits  Resp: clear to auscultation bilaterally  Chest wall: no tenderness  Cardio: regular rate and rhythm, S1, S2 normal, no murmur, click, rub or gallop  GI: soft, non-tender; bowel sounds normal; no masses, no organomegaly  Extremities: extremities normal, atraumatic, no cyanosis or edema  Pulses: 2+ and symmetric, right groin site without any hematoma. Skin: Skin color, texture, turgor normal. No rashes or lesions  Neurologic: Grossly normal   Labs:   Lab Results  Component Value Date   WBC 6.2 12/05/2012   HGB 10.0* 12/05/2012   HCT 29.0* 12/05/2012   MCV 89.8 12/05/2012   PLT 166 12/05/2012    Recent Labs Lab 12/02/12 0355  12/05/12 0440  NA 128*  < > 135  K 5.0  < > 4.3  CL 95*  < > 103  CO2 21  < > 22  BUN 39*  < > 20  CREATININE 1.42*  < > 1.10  CALCIUM 8.9  < > 8.6  PROT 7.0  --   --   BILITOT 0.5  --   --   ALKPHOS 124*  --   --   ALT 11  --   --   AST 18  --   --   GLUCOSE 292*  < > 140*  < > = values in this interval not  displayed. Lab Results  Component Value Date   CKTOTAL 309* 09/03/2012   CKMB 16.7* 09/03/2012   TROPONINI 2.66* 12/05/2012    Lipid Panel     Component Value Date/Time   CHOL 125 12/03/2012 0355   TRIG 42 12/03/2012 0355   HDL 72 12/03/2012 0355   CHOLHDL 1.7 12/03/2012 0355   VLDL 8 12/03/2012 0355   LDLCALC 45 12/03/2012 0355   HbA1c 12/02/2012: 7.7%.   guaiac was negative for occult blood.   Cardiac Panel (last 3 results)  BJY:NWGNFA sinus rhythm at the rate of 70 bpm, normal axis, LVH.  Inferior and lateral ST segment depression cannot rule out inferolateral ischemia.  Cannot rule out inferior and lateral subendocardial myocardial infarction. Unchanged from previous tracings.    Radiology: Dg Chest Port 1 View  12/02/2012   CLINICAL DATA:  Chest pain and gastroesophageal reflux disease. Hypertension and diabetes.  EXAM: PORTABLE CHEST - 1 VIEW  COMPARISON:  09/02/2012 and 06/01/2012  FINDINGS: Midline trachea. Normal heart size with atherosclerosis in the transverse aorta. No left and no definite right pleural effusion. No pneumothorax. Interstitial thickening is moderate and similar to 09/02/2012. Increased since 06/01/2012. No lobar consolidation.  IMPRESSION: Persistent moderate interstitial thickening, suspicious for mild interstitial edema.   Electronically Signed   By: Jeronimo Greaves  On: 12/02/2012 04:27      FOLLOW UP PLANS AND APPOINTMENTS  Future Appointments Provider Department Dept Phone   12/24/2012 8:00 AM Sherrie George, MD [facility] [phone]       Medication List         aspirin EC 81 MG tablet  Take 81 mg by mouth daily.     B-D ULTRAFINE III SHORT PEN 31G X 8 MM Misc  Generic drug:  Insulin Pen Needle  USE AS DIRECTED WITH LANTUS PEN     carvedilol 6.25 MG tablet  Commonly known as:  COREG  Take 6.25 mg by mouth 2 (two) times daily with a meal.     Fish Oil 1200 MG Caps  Take 1 capsule by mouth 2 (two) times daily.      furosemide 40 MG tablet  Commonly known as:  LASIX  Take 0.5 tablets (20 mg total) by mouth daily as needed.     insulin lispro 100 UNIT/ML injection  Commonly known as:  HUMALOG  Inject 7-10 Units into the skin 3 (three) times daily before meals.     isosorbide mononitrate 60 MG 24 hr tablet  Commonly known as:  IMDUR  Take 1 tablet (60 mg total) by mouth daily.     LANTUS SOLOSTAR 100 UNIT/ML injection  Generic drug:  insulin glargine  Inject 20 Units into the skin at bedtime.     losartan 50 MG tablet  Commonly known as:  COZAAR  Take 0.5 tablets (25 mg total) by mouth daily.     meclizine 25 MG tablet  Commonly known as:  ANTIVERT  Take 25 mg by mouth every 4 (four) hours as needed.     nitroGLYCERIN 0.4 MG SL tablet  Commonly known as:  NITROSTAT  Place 0.4 mg under the tongue every 5 (five) minutes as needed for chest pain.     omeprazole 20 MG capsule  Commonly known as:  PRILOSEC  Take 20 mg by mouth 2 (two) times daily.     prasugrel 10 MG Tabs tablet  Commonly known as:  EFFIENT  Take 1 tablet (10 mg total) by mouth daily.     simvastatin 20 MG tablet  Commonly known as:  ZOCOR  Take 1 tablet (20 mg total) by mouth every evening.           Follow-up Information   Follow up with [name], MD On 12/13/2012. (Come in at 12:45 pm to be seen at 1 pm)    Specialty:  Cardiology   Contact information:   [address] [place] [address] [phone]        [name], MD 12/05/2012, 9:19 PM  Pager: [phone] Office: [phone] If no answer: [phone]

## 2012-12-05 NOTE — Progress Notes (Signed)
CARDIAC REHAB PHASE I   PRE:  Rate/Rhythm: 80 SR    BP: sitting 177/62    SaO2: 96 RA  MODE:  Ambulation: 390 ft   POST:  Rate/Rhythm: 102 ST    BP: sitting 175/50     SaO2: 95 RA  Pt tolerated well with RW. No CP. Felt well. Wants RW for home, please order. Ed reviewed. Pt unable to do CRPII due to copay. Plans to walk at [facility] with friends. [phone]   Harriet Masson CES, ACSM 12/05/2012 9:54 AM

## 2012-12-05 NOTE — Progress Notes (Signed)
Discharged home accompanied by daughter by wheelchair, discharge instructions given to pt. Stable. Belongings with pt. Walker to be delivered home as per care management , pt made aware.

## 2012-12-21 ENCOUNTER — Other Ambulatory Visit: Payer: Self-pay | Admitting: Gastroenterology

## 2012-12-24 ENCOUNTER — Ambulatory Visit (INDEPENDENT_AMBULATORY_CARE_PROVIDER_SITE_OTHER): Payer: Medicare PPO | Admitting: Ophthalmology

## 2012-12-24 DIAGNOSIS — I10 Essential (primary) hypertension: Secondary | ICD-10-CM

## 2012-12-24 DIAGNOSIS — H35039 Hypertensive retinopathy, unspecified eye: Secondary | ICD-10-CM

## 2012-12-24 DIAGNOSIS — E11359 Type 2 diabetes mellitus with proliferative diabetic retinopathy without macular edema: Secondary | ICD-10-CM

## 2012-12-24 DIAGNOSIS — E1139 Type 2 diabetes mellitus with other diabetic ophthalmic complication: Secondary | ICD-10-CM

## 2012-12-24 DIAGNOSIS — H43819 Vitreous degeneration, unspecified eye: Secondary | ICD-10-CM

## 2012-12-24 DIAGNOSIS — H4310 Vitreous hemorrhage, unspecified eye: Secondary | ICD-10-CM

## 2013-01-01 ENCOUNTER — Ambulatory Visit (INDEPENDENT_AMBULATORY_CARE_PROVIDER_SITE_OTHER): Payer: Medicare PPO | Admitting: Family Medicine

## 2013-01-01 ENCOUNTER — Encounter: Payer: Self-pay | Admitting: Family Medicine

## 2013-01-01 VITALS — BP 126/64 | HR 68 | Temp 98.0°F | Resp 18 | Wt 191.0 lb

## 2013-01-01 DIAGNOSIS — Z23 Encounter for immunization: Secondary | ICD-10-CM

## 2013-01-01 DIAGNOSIS — N390 Urinary tract infection, site not specified: Secondary | ICD-10-CM

## 2013-01-01 DIAGNOSIS — R300 Dysuria: Secondary | ICD-10-CM

## 2013-01-01 DIAGNOSIS — Z09 Encounter for follow-up examination after completed treatment for conditions other than malignant neoplasm: Secondary | ICD-10-CM

## 2013-01-01 LAB — URINALYSIS, ROUTINE W REFLEX MICROSCOPIC
Glucose, UA: NEGATIVE mg/dL
Ketones, ur: NEGATIVE mg/dL
Nitrite: POSITIVE — AB
Specific Gravity, Urine: 1.015 (ref 1.005–1.030)

## 2013-01-01 LAB — URINALYSIS, MICROSCOPIC ONLY: Casts: NONE SEEN

## 2013-01-01 MED ORDER — CIPROFLOXACIN HCL 500 MG PO TABS: 500.0000 mg | ORAL_TABLET | Freq: Two times a day (BID) | ORAL | Status: DC

## 2013-01-01 NOTE — Progress Notes (Signed)
Subjective:    Patient ID: Claudia [name], female    DOB: Jul 11, 1931, 79 y.o.   MRN: [id number]  HPI  Patient was recently admitted with a non-ST elevation myocardial infarction due to in-stent restenosis. She was treated with percutaneous transluminal coronary angioplasty with improvement of symptoms. Her cardiologist states if this occurs again she would need coronary artery bypass. Fortunately her hemoglobin A1c was found to be 7.7 in hospital. She's currently using Lantus 20 units subcutaneous daily and 7 units of rapid acting insulin with meals. However since she left the hospital she has been having dysuria and frequency. She believes she has a bladder infection. Infection her sugars have been reaching 300s.. Past Medical History  Diagnosis Date  . GERD (gastroesophageal reflux disease)   . PUD (peptic ulcer disease)   . Anemia   . Hypertension   . S/P endoscopy Aug 2011    3 superficial gastric ulcers, NSAID-induced  . S/P colonoscopy Sept 2011    left-sided diverticula, tubular adenoma  . Coronary artery disease   . Shortness of breath   . Diabetes mellitus     insulin dependent  . Headache(784.0)     rare migraines  . Cancer     hx of skin cancer  . Arthritis   . Osteopenia   . Hypercholesterolemia   . SIADH (syndrome of inappropriate ADH production)    Current Outpatient Prescriptions on File Prior to Visit  Medication Sig Dispense Refill  . aspirin EC 81 MG tablet Take 81 mg by mouth daily.      . B-D ULTRAFINE III SHORT PEN 31G X 8 MM MISC USE AS DIRECTED WITH LANTUS PEN  100 each  5  . carvedilol (COREG) 6.25 MG tablet Take 6.25 mg by mouth 2 (two) times daily with a meal.       . furosemide (LASIX) 40 MG tablet Take 0.5 tablets (20 mg total) by mouth daily as needed.  30 tablet  11  . insulin glargine (LANTUS SOLOSTAR) 100 UNIT/ML injection Inject 20 Units into the skin at bedtime.      . insulin lispro (HUMALOG) 100 UNIT/ML injection Inject 7-10 Units into the  skin 3 (three) times daily before meals.       . isosorbide mononitrate (IMDUR) 60 MG 24 hr tablet Take 1 tablet (60 mg total) by mouth daily.  30 tablet  3  . losartan (COZAAR) 50 MG tablet Take 0.5 tablets (25 mg total) by mouth daily.  30 tablet  11  . meclizine (ANTIVERT) 25 MG tablet Take 25 mg by mouth every 4 (four) hours as needed.      . nitroGLYCERIN (NITROSTAT) 0.4 MG SL tablet Place 0.4 mg under the tongue every 5 (five) minutes as needed for chest pain.      . Omega-3 Fatty Acids (FISH OIL) 1200 MG CAPS Take 1 capsule by mouth 2 (two) times daily.       Marland Kitchen omeprazole (PRILOSEC) 20 MG capsule Take 20 mg by mouth 2 (two) times daily.       . prasugrel (EFFIENT) 10 MG TABS Take 1 tablet (10 mg total) by mouth daily.  30 tablet  0  . simvastatin (ZOCOR) 20 MG tablet Take 1 tablet (20 mg total) by mouth every evening.  90 tablet  1   No current facility-administered medications on file prior to visit.   Allergies  Allergen Reactions  . Aspirin Other (See Comments)    Intolerant to high doses   History  Social History  . Marital Status: Widowed    Spouse Name: N/A    Number of Children: N/A  . Years of Education: N/A   Occupational History  . Not on file.   Social History Main Topics  . Smoking status: Never Smoker   . Smokeless tobacco: Never Used  . Alcohol Use: No  . Drug Use: No  . Sexual Activity: No   Other Topics Concern  . Not on file   Social History Narrative  . No narrative on file    Review of Systems  All other systems reviewed and are negative.       Objective:   Physical Exam  Vitals reviewed. Constitutional: She appears well-developed and well-nourished.  Cardiovascular: Normal rate, regular rhythm and normal heart sounds.   Pulmonary/Chest: Effort normal and breath sounds normal. No respiratory distress. She has no wheezes. She has no rales. She exhibits no tenderness.  Abdominal: Soft. Bowel sounds are normal. She exhibits no distension  and no mass. There is no tenderness. There is no rebound and no guarding.  Musculoskeletal: She exhibits no edema.     Office Visit on 01/01/2013  Component Date Value Range Status  . Color, Urine 01/01/2013 YELLOW  YELLOW Final  . APPearance 01/01/2013 CLOUDY* CLEAR Final  . Specific Gravity, Urine 01/01/2013 1.015  1.005 - 1.030 Final  . pH 01/01/2013 5.0  5.0 - 8.0 Final  . Glucose, UA 01/01/2013 NEG  NEG mg/dL Final  . Bilirubin Urine 01/01/2013 NEG  NEG Final  . Ketones, ur 01/01/2013 NEG  NEG mg/dL Final  . Hgb urine dipstick 01/01/2013 SMALL* NEG Final  . Protein, ur 01/01/2013 NEG  NEG mg/dL Final  . Urobilinogen, UA 01/01/2013 0.2  0.0 - 1.0 mg/dL Final  . Nitrite 13/10/6576 POS* NEG Final  . Leukocytes, UA 01/01/2013 LARGE* NEG Final  . Squamous Epithelial / LPF 01/01/2013 RARE  RARE Final  . Crystals 01/01/2013 NONE SEEN  NONE SEEN Final  . Casts 01/01/2013 NONE SEEN  NONE SEEN Final  . WBC, UA 01/01/2013 TNTC* <3 WBC/hpf Final  . RBC / HPF 01/01/2013 3-6* <3 RBC/hpf Final  . Bacteria, UA 01/01/2013 MANY* RARE Final        Assessment & Plan:  Burning with urination - Plan: Urinalysis, Routine w reflex microscopic, Urine culture  UTI (urinary tract infection) - Plan: ciprofloxacin (CIPRO) 500 MG tablet, Urine culture  Need for prophylactic vaccination and inoculation against influenza - Plan: Flu Vaccine QUAD 36+ mos PF IM (Fluarix)  Hospital discharge follow-up  Patient clearly has a urinary tract infection which may be causing transient hypoglycemia. The patient with Cipro 500 mg by mouth twice a day for 7 days. I will send a urine culture given her recent hospitalization to insure that she does not have a resistant bacteria. Meanwhile continue Lantus 20 units subcutaneous daily along with 7 units of Humalog with meals. Also gave the patient a sliding scale correction. She is to check her sugar before each meal. She is to give herself an additional 2 units of rapid  insulin for every unit of 50 above 150.  For instance if her sugar prior to a meal is 250, she will give herself the standard 7 units plus a correction factor of 4 units for a total of 11 units. Recheck her sugars in 3 weeks. If they remain persistently elevated will need to increase her Lantus

## 2013-01-03 LAB — URINE CULTURE: Colony Count: >=100000

## 2013-01-21 ENCOUNTER — Telehealth: Payer: Self-pay | Admitting: Family Medicine

## 2013-01-22 ENCOUNTER — Encounter (HOSPITAL_COMMUNITY): Payer: Self-pay | Admitting: Emergency Medicine

## 2013-01-22 ENCOUNTER — Encounter (HOSPITAL_COMMUNITY)
Admission: EM | Disposition: A | Discharge status: Home / Self Care | Payer: Self-pay | Source: Home / Self Care | Attending: Cardiology

## 2013-01-22 ENCOUNTER — Emergency Department (HOSPITAL_COMMUNITY): Payer: Medicare HMO

## 2013-01-22 ENCOUNTER — Inpatient Hospital Stay (HOSPITAL_COMMUNITY)
Admission: EM | Admit: 2013-01-22 | Discharge: 2013-01-23 | DRG: 281 | Disposition: A | Discharge status: Home / Self Care | Payer: Medicare HMO | Attending: Cardiology | Admitting: Cardiology

## 2013-01-22 ENCOUNTER — Other Ambulatory Visit: Payer: Self-pay

## 2013-01-22 ENCOUNTER — Ambulatory Visit (INDEPENDENT_AMBULATORY_CARE_PROVIDER_SITE_OTHER): Payer: Medicare HMO | Admitting: Family Medicine

## 2013-01-22 ENCOUNTER — Encounter: Payer: Self-pay | Admitting: Family Medicine

## 2013-01-22 VITALS — BP 140/50 | HR 62 | Temp 97.6°F | Resp 16 | Wt 201.0 lb

## 2013-01-22 DIAGNOSIS — Z9861 Coronary angioplasty status: Secondary | ICD-10-CM

## 2013-01-22 DIAGNOSIS — Z96659 Presence of unspecified artificial knee joint: Secondary | ICD-10-CM

## 2013-01-22 DIAGNOSIS — I2510 Atherosclerotic heart disease of native coronary artery without angina pectoris: Secondary | ICD-10-CM | POA: Diagnosis present

## 2013-01-22 DIAGNOSIS — Z85828 Personal history of other malignant neoplasm of skin: Secondary | ICD-10-CM

## 2013-01-22 DIAGNOSIS — K219 Gastro-esophageal reflux disease without esophagitis: Secondary | ICD-10-CM | POA: Diagnosis present

## 2013-01-22 DIAGNOSIS — I10 Essential (primary) hypertension: Secondary | ICD-10-CM | POA: Diagnosis present

## 2013-01-22 DIAGNOSIS — E119 Type 2 diabetes mellitus without complications: Secondary | ICD-10-CM

## 2013-01-22 DIAGNOSIS — IMO0001 Reserved for inherently not codable concepts without codable children: Secondary | ICD-10-CM | POA: Diagnosis present

## 2013-01-22 DIAGNOSIS — I200 Unstable angina: Secondary | ICD-10-CM

## 2013-01-22 DIAGNOSIS — E079 Disorder of thyroid, unspecified: Secondary | ICD-10-CM | POA: Diagnosis present

## 2013-01-22 DIAGNOSIS — Z79899 Other long term (current) drug therapy: Secondary | ICD-10-CM

## 2013-01-22 DIAGNOSIS — Z7982 Long term (current) use of aspirin: Secondary | ICD-10-CM

## 2013-01-22 DIAGNOSIS — E785 Hyperlipidemia, unspecified: Secondary | ICD-10-CM | POA: Diagnosis present

## 2013-01-22 DIAGNOSIS — I214 Non-ST elevation (NSTEMI) myocardial infarction: Principal | ICD-10-CM | POA: Diagnosis present

## 2013-01-22 DIAGNOSIS — Z794 Long term (current) use of insulin: Secondary | ICD-10-CM

## 2013-01-22 DIAGNOSIS — M7989 Other specified soft tissue disorders: Secondary | ICD-10-CM

## 2013-01-22 DIAGNOSIS — M129 Arthropathy, unspecified: Secondary | ICD-10-CM | POA: Diagnosis present

## 2013-01-22 DIAGNOSIS — D638 Anemia in other chronic diseases classified elsewhere: Secondary | ICD-10-CM | POA: Diagnosis present

## 2013-01-22 DIAGNOSIS — N390 Urinary tract infection, site not specified: Secondary | ICD-10-CM

## 2013-01-22 DIAGNOSIS — E236 Other disorders of pituitary gland: Secondary | ICD-10-CM | POA: Diagnosis present

## 2013-01-22 HISTORY — DX: Acute myocardial infarction, unspecified: I21.9

## 2013-01-22 HISTORY — PX: LEFT HEART CATHETERIZATION WITH CORONARY ANGIOGRAM: SHX5451

## 2013-01-22 HISTORY — PX: CARDIAC CATHETERIZATION: SHX172

## 2013-01-22 HISTORY — DX: Heart failure, unspecified: I50.9

## 2013-01-22 LAB — CBC
HCT: 27.6 % — ABNORMAL LOW (ref 36.0–46.0)
MCH: 31.5 pg (ref 26.0–34.0)
MCHC: 34.4 g/dL (ref 30.0–36.0)
MCV: 91.4 fL (ref 78.0–100.0)
Platelets: 210 10*3/uL (ref 150–400)
RBC: 3.02 MIL/uL — ABNORMAL LOW (ref 3.87–5.11)
RDW: 12.9 % (ref 11.5–15.5)
WBC: 6.2 10*3/uL (ref 4.0–10.5)

## 2013-01-22 LAB — BASIC METABOLIC PANEL
BUN: 31 mg/dL — ABNORMAL HIGH (ref 6–23)
CO2: 23 mEq/L (ref 19–32)
Calcium: 9.2 mg/dL (ref 8.4–10.5)
Creatinine, Ser: 1.36 mg/dL — ABNORMAL HIGH (ref 0.50–1.10)
GFR calc Af Amer: 42 mL/min — ABNORMAL LOW (ref >90)

## 2013-01-22 LAB — GLUCOSE, CAPILLARY
Glucose-Capillary: 93 mg/dL (ref 70–99)
Glucose-Capillary: 156 mg/dL — ABNORMAL HIGH (ref 70–99)

## 2013-01-22 LAB — PRO B NATRIURETIC PEPTIDE: Pro B Natriuretic peptide (BNP): 7728 pg/mL — ABNORMAL HIGH (ref 0–450)

## 2013-01-22 LAB — TROPONIN I: Troponin I: 0.62 ng/mL (ref <0.30)

## 2013-01-22 LAB — POCT I-STAT TROPONIN I: Troponin i, poc: 0.3 ng/mL (ref 0.00–0.08)

## 2013-01-22 SURGERY — LEFT HEART CATHETERIZATION WITH CORONARY ANGIOGRAM
Anesthesia: LOCAL

## 2013-01-22 MED ORDER — HEPARIN (PORCINE) IN NACL 2-0.9 UNIT/ML-% IJ SOLN
INTRAMUSCULAR | Status: AC
  Filled 2013-01-22: qty 1000

## 2013-01-22 MED ORDER — ISOSORBIDE MONONITRATE ER 60 MG PO TB24
60.0000 mg | ORAL_TABLET | Freq: Every day | ORAL | Status: DC
  Administered 2013-01-23: 60 mg via ORAL
  Filled 2013-01-22 (×2): qty 1

## 2013-01-22 MED ORDER — NITROGLYCERIN 0.4 MG SL SUBL: 0.4000 mg | SUBLINGUAL_TABLET | SUBLINGUAL | Status: DC | PRN

## 2013-01-22 MED ORDER — ASPIRIN EC 81 MG PO TBEC
81.0000 mg | DELAYED_RELEASE_TABLET | Freq: Every day | ORAL | Status: DC
  Administered 2013-01-23: 81 mg via ORAL
  Filled 2013-01-22 (×2): qty 1

## 2013-01-22 MED ORDER — SODIUM CHLORIDE 0.9 % IV SOLN: 250.0000 mL | INTRAVENOUS | Status: DC | PRN

## 2013-01-22 MED ORDER — SIMVASTATIN 20 MG PO TABS
20.0000 mg | ORAL_TABLET | Freq: Every evening | ORAL | Status: DC
  Filled 2013-01-22 (×2): qty 1

## 2013-01-22 MED ORDER — NITROGLYCERIN 0.2 MG/ML ON CALL CATH LAB
INTRAVENOUS | Status: AC
  Filled 2013-01-22: qty 1

## 2013-01-22 MED ORDER — LIDOCAINE HCL (PF) 1 % IJ SOLN
INTRAMUSCULAR | Status: AC
  Filled 2013-01-22: qty 30

## 2013-01-22 MED ORDER — MIDAZOLAM HCL 2 MG/2ML IJ SOLN
INTRAMUSCULAR | Status: AC
  Filled 2013-01-22: qty 2

## 2013-01-22 MED ORDER — PANTOPRAZOLE SODIUM 40 MG PO TBEC
40.0000 mg | DELAYED_RELEASE_TABLET | Freq: Every day | ORAL | Status: DC
  Administered 2013-01-23: 40 mg via ORAL
  Filled 2013-01-22: qty 1

## 2013-01-22 MED ORDER — SODIUM CHLORIDE 0.9 % IV SOLN: 1.0000 mL/kg/h | INTRAVENOUS | Status: AC

## 2013-01-22 MED ORDER — HEPARIN BOLUS VIA INFUSION
4000.0000 [IU] | Freq: Once | INTRAVENOUS | Status: AC
  Administered 2013-01-22: 4000 [IU] via INTRAVENOUS
  Filled 2013-01-22: qty 4000

## 2013-01-22 MED ORDER — PRASUGREL HCL 10 MG PO TABS
10.0000 mg | ORAL_TABLET | Freq: Every day | ORAL | Status: DC
  Administered 2013-01-23: 10 mg via ORAL
  Filled 2013-01-22 (×2): qty 1

## 2013-01-22 MED ORDER — SODIUM CHLORIDE 0.9 % IJ SOLN: 3.0000 mL | INTRAMUSCULAR | Status: DC | PRN

## 2013-01-22 MED ORDER — FENTANYL CITRATE 0.05 MG/ML IJ SOLN
INTRAMUSCULAR | Status: AC
  Filled 2013-01-22: qty 2

## 2013-01-22 MED ORDER — CARVEDILOL 6.25 MG PO TABS
6.2500 mg | ORAL_TABLET | Freq: Two times a day (BID) | ORAL | Status: DC
  Administered 2013-01-22 – 2013-01-23 (×2): 6.25 mg via ORAL
  Filled 2013-01-22 (×4): qty 1

## 2013-01-22 MED ORDER — HEPARIN (PORCINE) IN NACL 100-0.45 UNIT/ML-% IJ SOLN
1300.0000 [IU]/h | INTRAMUSCULAR | Status: DC
  Administered 2013-01-22: 1300 [IU]/h via INTRAVENOUS
  Filled 2013-01-22 (×3): qty 250

## 2013-01-22 MED ORDER — ONDANSETRON HCL 4 MG/2ML IJ SOLN: 4.0000 mg | Freq: Four times a day (QID) | INTRAMUSCULAR | Status: DC | PRN

## 2013-01-22 MED ORDER — LOSARTAN POTASSIUM 25 MG PO TABS
25.0000 mg | ORAL_TABLET | Freq: Every day | ORAL | Status: DC
  Administered 2013-01-23: 25 mg via ORAL
  Filled 2013-01-22 (×2): qty 1

## 2013-01-22 MED ORDER — INSULIN GLARGINE 100 UNIT/ML ~~LOC~~ SOLN
20.0000 [IU] | Freq: Every day | SUBCUTANEOUS | Status: DC
  Administered 2013-01-22: 20 [IU] via SUBCUTANEOUS
  Filled 2013-01-22 (×3): qty 0.2

## 2013-01-22 MED ORDER — ASPIRIN EC 81 MG PO TBEC
81.0000 mg | DELAYED_RELEASE_TABLET | Freq: Every day | ORAL | Status: DC
  Filled 2013-01-22: qty 1

## 2013-01-22 MED ORDER — SODIUM CHLORIDE 0.9 % IV SOLN: INTRAVENOUS | Status: DC

## 2013-01-22 MED ORDER — SODIUM CHLORIDE 0.9 % IJ SOLN: 3.0000 mL | Freq: Two times a day (BID) | INTRAMUSCULAR | Status: DC

## 2013-01-22 MED ORDER — ACETAMINOPHEN 325 MG PO TABS: 650.0000 mg | ORAL_TABLET | ORAL | Status: DC | PRN

## 2013-01-22 MED ORDER — NITROGLYCERIN IN D5W 200-5 MCG/ML-% IV SOLN
5.0000 ug/min | INTRAVENOUS | Status: DC
  Administered 2013-01-22: 5 ug/min via INTRAVENOUS
  Filled 2013-01-22: qty 250

## 2013-01-22 NOTE — ED Notes (Signed)
Family at bedside. 

## 2013-01-22 NOTE — ED Notes (Signed)
Pt reports going to pcp today due to a fall on Thursday, now having sob, swelling to leg and chest pressure with exertion. ekg done at triage, airway intact.

## 2013-01-22 NOTE — CV Procedure (Signed)
Left heart catheterization including hemodynamic monitoring of the left ventricle, LV gram, selective right and left coronary arteriography.    Indication: Patient is a 79 year-old female with history of hypertension, hyperlipidemia, Diabetes Mellitus who presents with NSTEMI. Patient has  1. 12/04/2012: PTCA and stenting of the ostial circumflex coronary artery with implantation of a 2.75 x 12 mm Promus premier drug-eluting stent into the ostial circumflex coronary artery. 2) H/O Prox/Mid Circumflex 2.5x25 Promus , ramus intermediate 2.5x26 Resolute DES stent placed on 07/19/11.  3). PTCA and scoring balloon PTCA of the Ramus intermediate and circumflex in-stent restenosis on 12/21/2011 with 2.5 x 10 mm angiosculpt balloon.  4) 02/20/2012 stenting of the circumflex coronary artery mid and proximal segment with implantation of a 2.5 x 32 mm Promus premier drug-eluting stent for recurrent in-stent restenosis.  5) 2.5 x 12 mm Xience drug-eluting stent into the proximal segment of the circumflex coronary artery for in-stent restenosis on 09/03/2012. She had occluded RI at the time which was balloon angioplasty performed.   She had been doing well 3 AM this morning, had chest pain and taken sublingual nitroglycerin. She had mild lingering chest discomfort that was difficult to state whether it was anginal but she states that although different she could not excluded this is angina pectoris. With a history of multiple coronary interventions in the past, mild elevated serum troponins EKG showing lateral ST segment depression with  elevated cardiac markers for NSTEMI,   patient is brought to the cardiac catheterization lab to reevaluate her coronary anatomy.   Hemodynamic data:  Aortic pressure was  149/48 with a mean of 83 mm mercury. LV 152/12/21 EDP mm Hg. No PG across the AV.   Right coronary artery: Is dominant, with a high-grade calcific 99% ostial stenosis. A small caliber vessel. Diffusely diseased.  Occluded in the midsegment, long segment occlusion, of the origin of a small RV branch. Distal RCA is supplied by collaterals from the left system.   Left main coronary artery is large with mild diffuse calcification.   Circumflex coronary artery: A large vessel giving origin to a small obtuse marginal 1.  Circumflex artery stents are widely patent with mild neointimal hyperplasia. The distal circumflex coronary artery has mild diffuse luminal irregularity. Supplies a large territory.  LAD: LAD gives origin to a large diagonal-1. LAD has mild luminal irregularities.   Ramus intermediate: The vessel is occluded in the proximal segment, compared to 12/04/2012, the vessel was mid occluded  severely diffusely diseased and probably measures about 1.5 mm. previously placed stent is evident in the ramus intermediate. Faint type II collaterals from the ramus, take to distal segment of the ramus appears to be at least a 2.0-2.5 mm vessel. However this anatomy except for progression of proximal occlusion does not appear to have changed significantly.  Is also supplied by collaterals from the left system.  Impression: No significant change in coronary anatomy from prior coronary angiogram and since angioplasty on 12/04/2012, and the circumflex coronary artery stent is widely patent. OM 2, OM 3 and AV groove marginal branches are widely patent and circumflex supplies a large area. No change in the occluded RCA.  Recommendation: Continue medical therapy is indicated. Patient will have chronic angina due to her severe small vessel disease and 2 occluded vessels that are chronic including RCA and ramus intermediate. Unless cardiac markers go up significantly higher, she'll be discharged home in the morning. I suspect recent UTI leading to subendocardial ischemia from oxygen demand may have  lead to her symptoms of angina pectoris.  Technique of procedure: Using a 5 French right femoral arterial access, a 5 [place]  multipurpose B2 catheter was advanced into the ascending aorta and left the program was performed the RAO projection, followed by engagement of the RCA and angiography was performed. The catheter then pulled out of the body and exchanged to a 5 [place] JL4 diagnostic catheter and left main coronary artery was selectively and angiography was performed the catheter was then poorer body over the J-wire. When her pressure was held to achieve hemostasis. Patient tolerated the procedure well. A total of 40 cc of contrast was utilized for diagnostic angiography.

## 2013-01-22 NOTE — H&P (Signed)
Claudia [name] is an 79 y.o. female.   Chief Complaint: Chest pain HPI: Patient is a 79 year old Caucasian female with history of known coronary artery disease and has undergone multiple coronary intervention specifically involving the coronaries of the left system, and she has had recurrent in-stent restenosis involving the circumflex coronary artery and also ramus intermediate ranch of the circumflex coronary artery. On her previous admission on 12/02/2012,  when she presented similarly with NSTEMI, we had contemplated consideration for CABG, however she had had significant progression of native vessel disease and single culprit stenosis and hence underwent repeat angioplasty to the ostial circumflex coronary artery with implantation of a 2.75 x 12 mm promos Premier drug-eluting stent.    Patient had been doing well until last week she developed UTI and is presently being treated for a UTI. Early this morning at 3 AM, she had an episode of chest discomfort which she took several nitroglycerin with mild relief of chest discomfort, was seen by her PCP, an EKG was obtained today this morning, this was abnormal, although no significant change, with a history of multiple interventions and repeated admissions to the hospital, he recommended that she be admitted for further evaluation after a serum troponin was mildly elevated.  Patient on admission to the emergency room was complaining about chest discomfort somewhat similar to angina pectoris associated with shortness of breath but had improved. She was started on IV heparin and nitroglycerin and the chest pain got you completely. When I saw her she was asymptomatic.  Patient has had a fall 3 days ago and is bruised her right side of her leg., Right thigh and also right forearm.  Past Medical History  Diagnosis Date  . GERD (gastroesophageal reflux disease)   . PUD (peptic ulcer disease)   . Anemia   . Hypertension   . S/P endoscopy Aug 2011    3  superficial gastric ulcers, NSAID-induced  . S/P colonoscopy Sept 2011    left-sided diverticula, tubular adenoma  . Coronary artery disease   . Shortness of breath   . Diabetes mellitus     insulin dependent  . Headache(784.0)     rare migraines  . Cancer     hx of skin cancer  . Arthritis   . Osteopenia   . Hypercholesterolemia   . SIADH (syndrome of inappropriate ADH production)     Past Surgical History  Procedure Laterality Date  . Tonsillectomy    . Appendectomy    . Eye surgery      cataracts  . Cardiac stents  07/19/2011  . Cardiac catheterization  07/19/2011  . Esophagogastroduodenoscopy  10/16/09    normal without barrett's/three superficial gastric ulcers  . Colonoscopy  12/04/09    normal rectum/left-sided diverticula  . Joint replacement  arthroscopy to knee    History reviewed. No pertinent family history. Social History:  reports that she has never smoked. She has never used smokeless tobacco. She reports that she does not drink alcohol or use illicit drugs.  Allergies:  Allergies  Allergen Reactions  . Aspirin Other (See Comments)    Intolerant to high doses    Medications Prior to Admission  Medication Sig Dispense Refill  . aspirin EC 81 MG tablet Take 81 mg by mouth daily.      . B-D ULTRAFINE III SHORT PEN 31G X 8 MM MISC USE AS DIRECTED WITH LANTUS PEN  100 each  5  . carvedilol (COREG) 6.25 MG tablet Take 6.25 mg by mouth  2 (two) times daily with a meal.       . furosemide (LASIX) 40 MG tablet Take 0.5 tablets (20 mg total) by mouth daily as needed.  30 tablet  11  . insulin glargine (LANTUS SOLOSTAR) 100 UNIT/ML injection Inject 20 Units into the skin at bedtime.      . insulin lispro (HUMALOG) 100 UNIT/ML injection Inject 7-10 Units into the skin 3 (three) times daily before meals.       . isosorbide mononitrate (IMDUR) 60 MG 24 hr tablet Take 1 tablet (60 mg total) by mouth daily.  30 tablet  3  . losartan (COZAAR) 50 MG tablet Take 0.5 tablets  (25 mg total) by mouth daily.  30 tablet  11  . nitroGLYCERIN (NITROSTAT) 0.4 MG SL tablet Place 0.4 mg under the tongue every 5 (five) minutes as needed for chest pain.      . Omega-3 Fatty Acids (FISH OIL) 1200 MG CAPS Take 1 capsule by mouth daily.       Marland Kitchen omeprazole (PRILOSEC) 20 MG capsule Take 20 mg by mouth 2 (two) times daily.       . prasugrel (EFFIENT) 10 MG TABS Take 1 tablet (10 mg total) by mouth daily.  30 tablet  0  . simvastatin (ZOCOR) 20 MG tablet Take 1 tablet (20 mg total) by mouth every evening.  90 tablet  1    Review of Systems - Negative except Shortness of breath, uncontrolled diabetes. Recent elevation in blood pressure. No TIA. No symptoms of claudication. No hemoptysis or syncope.  Blood pressure 142/50, pulse 57, temperature 98.4 F (36.9 C), temperature source Oral, resp. rate 18, height 5\' 7"  (1.702 m), weight 87.998 kg (194 lb), SpO2 98.00%.  General appearance: alert, cooperative, no distress and mildly obese  Eyes: negative findings: lids and lashes normal  Neck: no adenopathy, no carotid bruit, no JVD, supple, symmetrical, trachea midline and thyroid not enlarged, symmetric, no tenderness/mass/nodules  Neck: JVP - normal, carotids 2+= without bruits  Resp: clear to auscultation bilaterally  Chest wall: no tenderness  Cardio: regular rate and rhythm, S1, S2 normal, no murmur, click, rub or gallop  GI: soft, non-tender; bowel sounds normal; no masses, no organomegaly  Extremities: extremities normal, right thigh has mild bruising without any hematoma or tenderness or signs of inflammation, no cyanosis. Chronic 1-2+ leg edema evident. Pitting edema below the knee.  Pulses: 2+ and symmetric    Results for orders placed during the hospital encounter of 01/22/13 (from the past 48 hour(s))  CBC     Status: Abnormal   Collection Time    01/22/13 10:25 AM      Result Value Range   WBC 6.2  4.0 - 10.5 K/uL   RBC 3.02 (*) 3.87 - 5.11 MIL/uL   Hemoglobin 9.5 (*)  12.0 - 15.0 g/dL   HCT 16.1 (*) 09.6 - 04.5 %   MCV 91.4  78.0 - 100.0 fL   MCH 31.5  26.0 - 34.0 pg   MCHC 34.4  30.0 - 36.0 g/dL   RDW 40.9  81.1 - 91.4 %   Platelets 210  150 - 400 K/uL  BASIC METABOLIC PANEL     Status: Abnormal   Collection Time    01/22/13 10:25 AM      Result Value Range   Sodium 137  135 - 145 mEq/L   Potassium 4.0  3.5 - 5.1 mEq/L   Chloride 102  96 - 112 mEq/L   CO2  23  19 - 32 mEq/L   Glucose, Bld 60 (*) 70 - 99 mg/dL   BUN 31 (*) 6 - 23 mg/dL   Creatinine, Ser 1.61 (*) 0.50 - 1.10 mg/dL   Calcium 9.2  8.4 - 09.6 mg/dL   GFR calc non Af Amer 36 (*) >90 mL/min   GFR calc Af Amer 42 (*) >90 mL/min   Comment: (NOTE)     The eGFR has been calculated using the CKD EPI equation.     This calculation has not been validated in all clinical situations.     eGFR's persistently <90 mL/min signify possible Chronic Kidney     Disease.  PRO B NATRIURETIC PEPTIDE     Status: Abnormal   Collection Time    01/22/13 10:25 AM      Result Value Range   Pro B Natriuretic peptide (BNP) 7728.0 (*) 0 - 450 pg/mL  PROTIME-INR     Status: None   Collection Time    01/22/13 10:43 AM      Result Value Range   Prothrombin Time 14.5  11.6 - 15.2 seconds   INR 1.15  0.00 - 1.49  APTT     Status: Abnormal   Collection Time    01/22/13 10:43 AM      Result Value Range   aPTT 44 (*) 24 - 37 seconds   Comment:            IF BASELINE aPTT IS ELEVATED,     SUGGEST PATIENT RISK ASSESSMENT     BE USED TO DETERMINE APPROPRIATE     ANTICOAGULANT THERAPY.  POCT I-STAT TROPONIN I     Status: Abnormal   Collection Time    01/22/13 11:05 AM      Result Value Range   Troponin i, poc 0.30 (*) 0.00 - 0.08 ng/mL   Comment NOTIFIED PHYSICIAN     Comment 3            Comment: Due to the release kinetics of cTnI,     a negative result within the first hours     of the onset of symptoms does not rule out     myocardial infarction with certainty.     If myocardial infarction is still  suspected,     repeat the test at appropriate intervals.  TROPONIN I     Status: Abnormal   Collection Time    01/22/13  3:07 PM      Result Value Range   Troponin I 0.62 (*) <0.30 ng/mL   Comment:            Due to the release kinetics of cTnI,     a negative result within the first hours     of the onset of symptoms does not rule out     myocardial infarction with certainty.     If myocardial infarction is still suspected,     repeat the test at appropriate intervals.     REPEATED TO VERIFY     CRITICAL RESULT CALLED TO, READ BACK BY AND VERIFIED WITH:     S TARPLEY,RN 1619 01/22/13 D BRADLEY  GLUCOSE, CAPILLARY     Status: None   Collection Time    01/22/13  4:53 PM      Result Value Range   Glucose-Capillary 93  70 - 99 mg/dL   Comment 1 Notify RN     Dg Chest Port 1 View  01/22/2013   CLINICAL DATA:  79 year old  female with chest pain and shortness of Breath. Recent fall. Initial encounter.  EXAM: PORTABLE CHEST - 1 VIEW  COMPARISON:  12/02/2012 and earlier.  FINDINGS: Portable AP semi upright view at 1144 hrs. Normal cardiac size and mediastinal contours. Visualized tracheal air column is within normal limits. No pneumothorax or pleural effusion. Interval regressed/ resolved bilateral perihilar and interstitial opacity. New No consolidation or worsening ventilation.  IMPRESSION: Interval resolved perihilar and interstitial opacity which probably was related to edema. No acute cardiopulmonary abnormality.   Electronically Signed   By: Augusto Gamble M.D.   On: 01/22/2013 12:07    Labs:   Lab Results  Component Value Date   WBC 6.2 01/22/2013   HGB 9.5* 01/22/2013   HCT 27.6* 01/22/2013   MCV 91.4 01/22/2013   PLT 210 01/22/2013    Recent Labs Lab 01/22/13 1025  NA 137  K 4.0  CL 102  CO2 23  BUN 31*  CREATININE 1.36*  CALCIUM 9.2  GLUCOSE 60*   Lab Results  Component Value Date   CKTOTAL 309* 09/03/2012   CKMB 16.7* 09/03/2012   TROPONINI 0.62* 01/22/2013       Lipid Panel     Component Value Date/Time   CHOL 125 12/03/2012 0355   TRIG 42 12/03/2012 0355   HDL 72 12/03/2012 0355   CHOLHDL 1.7 12/03/2012 0355   VLDL 8 12/03/2012 0355   LDLCALC 45 12/03/2012 0355    EKG: unchanged from previous tracings, sinus bradycardia at a rate of 55 beats a minute, left hypertrophy with repolarization abnormality, cannot exclude inferior and lateral ischemia.   Assessment/Plan 1. Non-ST elevation myocardial infarction involving the circumflex and or ramus intermediate branch of the circumflex coronary artery which is had multiple instent restenosis.  2. Severe native vessel coronary artery disease. Essentially 2 vessels that are large, severely diffusely diseased RCA with ostial high-grade stenosis collateralized from the left system, and large circumflex coronary artery which has been stented multiple times, a ramus intermediate which is severely diffusely diseased, not amenable for angioplasty and is occluded, not bypassable. 2. Anemia, normal indicis, probably anemia of chronic disease. 3. Diabetes mellitus type 2 uncontrolled with a hemoglobin A1c of 7.5 g, however this is much better compared to prior hemoglobin A1c.  4. Hypertension  5. Hyperlipidemia  Recommendation: Patient will be scheduled for coronary angiography, she is extremely high risk individual, if she has recurrent restenosis in the circumflex coronary artery, I will consider surgical opinion for CABG. I discussed these issues with the patient and her son at the bedside. They understand the risks, benefits and alternatives to coronary angiography and angioplasty.   [name], MD 01/22/2013, 5:20 PM [facility] Pager: [phone] Office: [phone] If no answer: Cell:  [phone]

## 2013-01-22 NOTE — ED Notes (Signed)
Moved to rm [id number]-- report to [place], Charity fundraiser

## 2013-01-22 NOTE — ED Notes (Signed)
Patient is resting comfortably. 

## 2013-01-22 NOTE — Progress Notes (Signed)
Subjective:    Patient ID: Claudia [name], female    DOB: 07-Jul-1931, 79 y.o.   MRN: [id number]  HPI 01/01/13  Patient was recently admitted with a non-ST elevation myocardial infarction due to in-stent restenosis. She was treated with percutaneous transluminal coronary angioplasty with improvement of symptoms. Her cardiologist states if this occurs again she would need coronary artery bypass. Fortunately her hemoglobin A1c was found to be 7.7 in hospital. She's currently using Lantus 20 units subcutaneous daily and 7 units of rapid acting insulin with meals. However since she left the hospital she has been having dysuria and frequency. She believes she has a bladder infection. Infection her sugars have been reaching 300s.  At that time, my plan was: Patient clearly has a urinary tract infection which may be causing transient hyperglycemia. Treat the patient with Cipro 500 mg by mouth twice a day for 7 days. I will send a urine culture given her recent hospitalization to insure that she does not have a resistant bacteria. Meanwhile continue Lantus 20 units subcutaneous daily along with 7 units of Humalog with meals. Also gave the patient a sliding scale correction. She is to check her sugar before each meal. She is to give herself an additional 2 units of rapid insulin for every unit of 50 above 150.  For instance if her sugar prior to a meal is 250, she will give herself the standard 7 units plus a correction factor of 4 units for a total of 11 units. Recheck her sugars in 3 weeks. If they remain persistently elevated will need to increase her Lantus.    She is here today for follow up.  Urine culture revealed E.coli sensitive to cipro.  However she fell Friday and landed on her right side. Ever since that time she had right-sided substernal chest pain that comes and goes. She had dyspnea on exertion. Last night she had chest pain at rest with shortness of breath improved with nitroglycerin. At the  present time she is pain-free. She denies any shortness of breath. She is asymptomatic. She denies any pleurisy, no tenderness to palpation over the ribs.  Unfortunately her EKG shows mild ST depression in lead 1, lead aVL, lead V4, V5, and V6. This is concerning for lateral ischemia. Past Medical History  Diagnosis Date  . GERD (gastroesophageal reflux disease)   . PUD (peptic ulcer disease)   . Anemia   . Hypertension   . S/P endoscopy Aug 2011    3 superficial gastric ulcers, NSAID-induced  . S/P colonoscopy Sept 2011    left-sided diverticula, tubular adenoma  . Coronary artery disease   . Shortness of breath   . Diabetes mellitus     insulin dependent  . Headache(784.0)     rare migraines  . Cancer     hx of skin cancer  . Arthritis   . Osteopenia   . Hypercholesterolemia   . SIADH (syndrome of inappropriate ADH production)    Current Outpatient Prescriptions on File Prior to Visit  Medication Sig Dispense Refill  . aspirin EC 81 MG tablet Take 81 mg by mouth daily.      . B-D ULTRAFINE III SHORT PEN 31G X 8 MM MISC USE AS DIRECTED WITH LANTUS PEN  100 each  5  . carvedilol (COREG) 6.25 MG tablet Take 6.25 mg by mouth 2 (two) times daily with a meal.       . ciprofloxacin (CIPRO) 500 MG tablet Take 1 tablet (500 mg total)  by mouth 2 (two) times daily.  14 tablet  0  . furosemide (LASIX) 40 MG tablet Take 0.5 tablets (20 mg total) by mouth daily as needed.  30 tablet  11  . insulin glargine (LANTUS SOLOSTAR) 100 UNIT/ML injection Inject 20 Units into the skin at bedtime.      . insulin lispro (HUMALOG) 100 UNIT/ML injection Inject 7-10 Units into the skin 3 (three) times daily before meals.       . isosorbide mononitrate (IMDUR) 60 MG 24 hr tablet Take 1 tablet (60 mg total) by mouth daily.  30 tablet  3  . losartan (COZAAR) 50 MG tablet Take 0.5 tablets (25 mg total) by mouth daily.  30 tablet  11  . meclizine (ANTIVERT) 25 MG tablet Take 25 mg by mouth every 4 (four) hours as  needed.      . nitroGLYCERIN (NITROSTAT) 0.4 MG SL tablet Place 0.4 mg under the tongue every 5 (five) minutes as needed for chest pain.      . Omega-3 Fatty Acids (FISH OIL) 1200 MG CAPS Take 1 capsule by mouth 2 (two) times daily.       Marland Kitchen omeprazole (PRILOSEC) 20 MG capsule Take 20 mg by mouth 2 (two) times daily.       . prasugrel (EFFIENT) 10 MG TABS Take 1 tablet (10 mg total) by mouth daily.  30 tablet  0  . simvastatin (ZOCOR) 20 MG tablet Take 1 tablet (20 mg total) by mouth every evening.  90 tablet  1   No current facility-administered medications on file prior to visit.   Allergies  Allergen Reactions  . Aspirin Other (See Comments)    Intolerant to high doses   History   Social History  . Marital Status: Widowed    Spouse Name: N/A    Number of Children: N/A  . Years of Education: N/A   Occupational History  . Not on file.   Social History Main Topics  . Smoking status: Never Smoker   . Smokeless tobacco: Never Used  . Alcohol Use: No  . Drug Use: No  . Sexual Activity: No   Other Topics Concern  . Not on file   Social History Narrative  . No narrative on file    Review of Systems  All other systems reviewed and are negative.       Objective:   Physical Exam  Vitals reviewed. Constitutional: She appears well-developed and well-nourished.  Cardiovascular: Normal rate, regular rhythm and normal heart sounds.   Pulmonary/Chest: Effort normal and breath sounds normal. No respiratory distress. She has no wheezes. She has no rales. She exhibits no tenderness.  Abdominal: Soft. Bowel sounds are normal. She exhibits no distension and no mass. There is no tenderness. There is no rebound and no guarding.  Musculoskeletal: She exhibits no edema.     No visits with results within 1 Day(s) from this visit. Latest known visit with results is:  Office Visit on 01/01/2013  Component Date Value Range Status  . Color, Urine 01/01/2013 YELLOW  YELLOW Final  .  APPearance 01/01/2013 CLOUDY* CLEAR Final  . Specific Gravity, Urine 01/01/2013 1.015  1.005 - 1.030 Final  . pH 01/01/2013 5.0  5.0 - 8.0 Final  . Glucose, UA 01/01/2013 NEG  NEG mg/dL Final  . Bilirubin Urine 01/01/2013 NEG  NEG Final  . Ketones, ur 01/01/2013 NEG  NEG mg/dL Final  . Hgb urine dipstick 01/01/2013 SMALL* NEG Final  . Protein, ur 01/01/2013  NEG  NEG mg/dL Final  . Urobilinogen, UA 01/01/2013 0.2  0.0 - 1.0 mg/dL Final  . Nitrite 16/12/9602 POS* NEG Final  . Leukocytes, UA 01/01/2013 LARGE* NEG Final  . Squamous Epithelial / LPF 01/01/2013 RARE  RARE Final  . Crystals 01/01/2013 NONE SEEN  NONE SEEN Final  . Casts 01/01/2013 NONE SEEN  NONE SEEN Final  . WBC, UA 01/01/2013 TNTC* <3 WBC/hpf Final  . RBC / HPF 01/01/2013 3-6* <3 RBC/hpf Final  . Bacteria, UA 01/01/2013 MANY* RARE Final  . Culture 01/01/2013 ESCHERICHIA COLI   Final  . Colony Count 01/01/2013 >=100,000 COLONIES/ML   Final  . Organism ID, Bacteria 01/01/2013 ESCHERICHIA COLI   Final        Assessment & Plan:   1. UTI (urinary tract infection) Clinically resolved.  2. DM (diabetes mellitus), type 2 Deferred due to problem #3.  3. Unstable angina Patient was given 4, 81 mg aspirins. She is currently pain-free. She denies any shortness of breath. I feel she is stable to be driven to the hospital by her son. However she needs to go to hospital immediately. I tried to call her cardiologist's office but he is not in the office today. Therefore I sent her to the emergency room and will try to page him once she is at the hospital.

## 2013-01-22 NOTE — ED Provider Notes (Signed)
CSN: [id number]     Arrival date & time 01/22/13  1013 History   First MD Initiated Contact with Patient 01/22/13 1031     Chief Complaint  Patient presents with  . Shortness of Breath  . Chest Pain   (Consider location/radiation/quality/duration/timing/severity/associated sxs/prior Treatment) Patient is a 79 y.o. female presenting with shortness of breath and chest pain. The history is provided by the patient.  Shortness of Breath Associated symptoms: chest pain   Associated symptoms: no abdominal pain, no headaches, no rash and no vomiting   Chest Pain Associated symptoms: shortness of breath   Associated symptoms: no abdominal pain, no back pain, no headache, no nausea, no numbness, not vomiting and no weakness    patient presents with shortness of breath or chest pain. Said if the last couple days. Described as a pressure. He does feel like her previous anginal pain. She had to take nitroglycerin last night that helped. Has been coming on with rest. She saw her primary care Dr. today the Center in to the ED. She's continued to have some pain. She was given aspirin. She is previous stents that reoccluded. She's told next time something happen she would have to have bypass. No cough. No fevers. She was recently treated for urinary tract infection. She has been unable to do the same physical activity that she was previously able to do  Past Medical History  Diagnosis Date  . GERD (gastroesophageal reflux disease)   . PUD (peptic ulcer disease)   . Anemia   . Hypertension   . S/P endoscopy Aug 2011    3 superficial gastric ulcers, NSAID-induced  . S/P colonoscopy Sept 2011    left-sided diverticula, tubular adenoma  . Coronary artery disease   . Shortness of breath   . Diabetes mellitus     insulin dependent  . Headache(784.0)     rare migraines  . Cancer     hx of skin cancer  . Arthritis   . Osteopenia   . Hypercholesterolemia   . SIADH (syndrome of inappropriate ADH  production)    Past Surgical History  Procedure Laterality Date  . Tonsillectomy    . Appendectomy    . Eye surgery      cataracts  . Cardiac stents  07/19/2011  . Cardiac catheterization  07/19/2011  . Esophagogastroduodenoscopy  10/16/09    normal without barrett's/three superficial gastric ulcers  . Colonoscopy  12/04/09    normal rectum/left-sided diverticula  . Joint replacement  arthroscopy to knee   History reviewed. No pertinent family history. History  Substance Use Topics  . Smoking status: Never Smoker   . Smokeless tobacco: Never Used  . Alcohol Use: No   OB History   Grav Para Term Preterm Abortions TAB SAB Ect Mult Living                 Review of Systems  Constitutional: Negative for activity change and appetite change.  Eyes: Negative for pain.  Respiratory: Positive for shortness of breath. Negative for chest tightness.   Cardiovascular: Positive for chest pain. Negative for leg swelling.  Gastrointestinal: Negative for nausea, vomiting, abdominal pain and diarrhea.  Genitourinary: Negative for flank pain.  Musculoskeletal: Negative for back pain and neck stiffness.  Skin: Negative for rash.  Neurological: Negative for weakness, numbness and headaches.  Psychiatric/Behavioral: Negative for behavioral problems.    Allergies  Aspirin  Home Medications   No current outpatient prescriptions on file. BP 142/50  Pulse 57  Temp(Src) 98.4 F (36.9 C) (Oral)  Resp 18  Ht 5\' 7"  (1.702 m)  Wt 194 lb (87.998 kg)  BMI 30.38 kg/m2  SpO2 98% Physical Exam  Nursing note and vitals reviewed. Constitutional: She is oriented to person, place, and time. She appears well-developed and well-nourished.  HENT:  Head: Normocephalic and atraumatic.  Eyes: EOM are normal. Pupils are equal, round, and reactive to light.  Neck: Normal range of motion. Neck supple.  Cardiovascular: Normal rate, regular rhythm and normal heart sounds.   No murmur heard. Pulmonary/Chest:  Effort normal and breath sounds normal. No respiratory distress. She has no wheezes. She has no rales.  Abdominal: Soft. Bowel sounds are normal. She exhibits no distension. There is no tenderness. There is no rebound and no guarding.  Musculoskeletal: Normal range of motion.  Neurological: She is alert and oriented to person, place, and time. No cranial nerve deficit.  Skin: Skin is warm and dry.  Psychiatric: She has a normal mood and affect. Her speech is normal.    ED Course  Procedures (including critical care time) Labs Review Labs Reviewed  CBC - Abnormal; Notable for the following:    RBC 3.02 (*)    Hemoglobin 9.5 (*)    HCT 27.6 (*)    All other components within normal limits  BASIC METABOLIC PANEL - Abnormal; Notable for the following:    Glucose, Bld 60 (*)    BUN 31 (*)    Creatinine, Ser 1.36 (*)    GFR calc non Af Amer 36 (*)    GFR calc Af Amer 42 (*)    All other components within normal limits  PRO B NATRIURETIC PEPTIDE - Abnormal; Notable for the following:    Pro B Natriuretic peptide (BNP) 7728.0 (*)    All other components within normal limits  APTT - Abnormal; Notable for the following:    aPTT 44 (*)    All other components within normal limits  TROPONIN I - Abnormal; Notable for the following:    Troponin I 0.62 (*)    All other components within normal limits  POCT I-STAT TROPONIN I - Abnormal; Notable for the following:    Troponin i, poc 0.30 (*)    All other components within normal limits  PROTIME-INR  HEPARIN LEVEL (UNFRACTIONATED)  TROPONIN I  TROPONIN I   Imaging Review Dg Chest Port 1 View  01/22/2013   CLINICAL DATA:  79 year old female with chest pain and shortness of Breath. Recent fall. Initial encounter.  EXAM: PORTABLE CHEST - 1 VIEW  COMPARISON:  12/02/2012 and earlier.  FINDINGS: Portable AP semi upright view at 1144 hrs. Normal cardiac size and mediastinal contours. Visualized tracheal air column is within normal limits. No  pneumothorax or pleural effusion. Interval regressed/ resolved bilateral perihilar and interstitial opacity. New No consolidation or worsening ventilation.  IMPRESSION: Interval resolved perihilar and interstitial opacity which probably was related to edema. No acute cardiopulmonary abnormality.   Electronically Signed   By: Augusto Gamble M.D.   On: 01/22/2013 12:07    EKG Interpretation    Date/Time:  Tuesday January 22 2013 10:17:26 EST Ventricular Rate:  55 PR Interval:  140 QRS Duration: 84 QT Interval:  434 QTC Calculation: 415 R Axis:   71 Text Interpretation:  Sinus bradycardia ST \\T \ T wave abnormality, consider lateral ischemia Abnormal ECG ST changes improved from previous Confirmed by Vidit Boissonneault  MD, Bristyn Kulesza ([id number]) on 01/22/2013 11:08:25 AM  MDM   1. Non-STEMI (non-ST elevated myocardial infarction)    Patient with chest pain. Feels somewhat like previous angina. X-ray reassuring. Troponin is mildly elevated. Patient's been told in the past that she had more episodes she would likely need a CABG. Patient was started on heparin and nitroglycerin. Pain resolved. Will be admitted to step down unit to be seen by cardiology. Has already had Asprin today  CRITICAL CARE Performed by: Billee Cashing Total critical care time: 30 Critical care time was exclusive of separately billable procedures and treating other patients. Critical care was necessary to treat or prevent imminent or life-threatening deterioration. Critical care was time spent personally by me on the following activities: development of treatment plan with patient and/or surrogate as well as nursing, discussions with consultants, evaluation of patient's response to treatment, examination of patient, obtaining history from patient or surrogate, ordering and performing treatments and interventions, ordering and review of laboratory studies, ordering and review of radiographic studies, pulse oximetry and  re-evaluation of patient's condition.    Juliet Rude. Rubin Payor, MD 01/22/13 1640

## 2013-01-22 NOTE — Progress Notes (Signed)
ANTICOAGULATION CONSULT NOTE - Initial Consult  Pharmacy Consult for Heparin Indication: chest pain/ACS  Allergies  Allergen Reactions  . Aspirin Other (See Comments)    Intolerant to high doses    Patient Measurements: Height: 5\' 7"  (170.2 cm) Weight: 201 lb (91.173 kg) IBW/kg (Calculated) : 61.6 Heparin Dosing Weight: 81 kg  Vital Signs: Temp: 97.5 F (36.4 C) (11/18 1022) Temp src: Oral (11/18 1022) BP: 148/41 mmHg (11/18 1022) Pulse Rate: 55 (11/18 1022)  Labs: No results found for this basename: HGB, HCT, PLT, APTT, LABPROT, INR, HEPARINUNFRC, CREATININE, CKTOTAL, CKMB, TROPONINI,  in the last 72 hours  Estimated Creatinine Clearance: 48.8 ml/min (by C-G formula based on Cr of 1.1).   Medical History: Past Medical History  Diagnosis Date  . GERD (gastroesophageal reflux disease)   . PUD (peptic ulcer disease)   . Anemia   . Hypertension   . S/P endoscopy Aug 2011    3 superficial gastric ulcers, NSAID-induced  . S/P colonoscopy Sept 2011    left-sided diverticula, tubular adenoma  . Coronary artery disease   . Shortness of breath   . Diabetes mellitus     insulin dependent  . Headache(784.0)     rare migraines  . Cancer     hx of skin cancer  . Arthritis   . Osteopenia   . Hypercholesterolemia   . SIADH (syndrome of inappropriate ADH production)     Medications:  See electronic med rec  Assessment: 79 y.o. female presents with chest pain. Noted recent admit 12/02/12-12/05/12 for NSTEMI due to stent restenosis. S/p PTCA/stenting at last hospitalization. Pt to begin heparin for r/o ACS. Hgb 9.5 at baseline, plt 210.  Heparin level therapeutic on 1300 units/hr at last admit.  Goal of Therapy:  Heparin level 0.3-0.7 units/ml Monitor platelets by anticoagulation protocol: Yes   Plan:  Heparin IV bolus 4000 units Heparin gtt at 1300 units/hr Will f/u 8 hr heparin level Daily heparin level and CBC  Christoper Fabian, PharmD, BCPS Clinical pharmacist,  pager [phone] 01/22/2013,11:21 AM

## 2013-01-22 NOTE — Interval H&P Note (Signed)
History and Physical Interval Note:  01/22/2013 5:33 PM  Claudia [name]  has presented today for surgery, with the diagnosis of [place]  The various methods of treatment have been discussed with the patient and family. After consideration of risks, benefits and other options for treatment, the patient has consented to  Procedure(s): LEFT HEART CATHETERIZATION WITH CORONARY ANGIOGRAM (N/A) and possible PCI as a surgical intervention .  The patient's history has been reviewed, patient examined, no change in status, stable for surgery.  I have reviewed the patient's chart and labs.  Questions were answered to the patient's satisfaction.   Cath Lab Visit (complete for each Cath Lab visit)  Clinical Evaluation Leading to the Procedure:   ACS: yes  Non-ACS:    Anginal Classification: CCS IV  Anti-ischemic medical therapy: Maximal Therapy (2 or more classes of medications)  Non-Invasive Test Results: No non-invasive testing performed  Prior CABG: No previous CABG        [facility]

## 2013-01-22 NOTE — Progress Notes (Signed)
*  PRELIMINARY RESULTS* Vascular Ultrasound Lower extremity venous duplex has been completed.  Preliminary findings: no evidence of DVT    Farrel Demark, RDMS, RVT  01/22/2013, 3:59 PM

## 2013-01-23 ENCOUNTER — Encounter (HOSPITAL_COMMUNITY): Payer: Self-pay | Admitting: General Practice

## 2013-01-23 ENCOUNTER — Inpatient Hospital Stay (HOSPITAL_COMMUNITY): Payer: Medicare HMO

## 2013-01-23 LAB — BASIC METABOLIC PANEL
GFR calc Af Amer: 47 mL/min — ABNORMAL LOW (ref >90)
GFR calc non Af Amer: 41 mL/min — ABNORMAL LOW (ref >90)
Glucose, Bld: 101 mg/dL — ABNORMAL HIGH (ref 70–99)
Potassium: 4.1 mEq/L (ref 3.5–5.1)
Sodium: 140 mEq/L (ref 135–145)

## 2013-01-23 LAB — CBC
HCT: 27.3 % — ABNORMAL LOW (ref 36.0–46.0)
Hemoglobin: 9.2 g/dL — ABNORMAL LOW (ref 12.0–15.0)
MCH: 31.2 pg (ref 26.0–34.0)
RBC: 2.95 MIL/uL — ABNORMAL LOW (ref 3.87–5.11)

## 2013-01-23 MED ORDER — RANOLAZINE ER 500 MG PO TB12
500.0000 mg | ORAL_TABLET | Freq: Two times a day (BID) | ORAL | Status: DC
  Administered 2013-01-23: 500 mg via ORAL
  Filled 2013-01-23 (×2): qty 1

## 2013-01-23 MED ORDER — RANOLAZINE ER 500 MG PO TB12: 500.0000 mg | ORAL_TABLET | Freq: Two times a day (BID) | ORAL | Status: DC

## 2013-01-23 MED ORDER — SODIUM CHLORIDE 0.9 % IV SOLN: INTRAVENOUS | Status: AC

## 2013-01-23 MED ORDER — IOHEXOL 350 MG/ML SOLN
100.0000 mL | Freq: Once | INTRAVENOUS | Status: AC | PRN
  Administered 2013-01-23: 100 mL via INTRAVENOUS

## 2013-01-23 NOTE — Progress Notes (Signed)
Utilization review completed.  

## 2013-01-24 NOTE — Discharge Summary (Signed)
Physician Discharge Summary  Patient ID: Claudia [name] MRN: [id number] DOB/AGE: 1931-09-29 79 y.o.  Admit date: 01/22/2013 Discharge date: 01/24/2013  Primary Discharge Diagnosis Chest pain musculoskeletal Shortness of breath probably secondary to reactive airway disease Secondary Discharge Diagnosis Thyromegaly by CT angiogram of the chest performed to exclude pulmonary embolism  Will need outpatient evaluation, we'll request Dr. Tanya [name] to follow-up.  Significant Diagnostic Studies: 01/23/2013: 1. 12/04/2012: PTCA and stenting of the ostial circumflex coronary artery with implantation of a 2.75 x 12 mm Promus premier drug-eluting stent into the ostial circumflex coronary artery.  2) H/O Prox/Mid Circumflex 2.5x25 Promus , ramus intermediate 2.5x26 Resolute DES stent placed on 07/19/11.  3). PTCA and scoring balloon PTCA of the Ramus intermediate and circumflex in-stent restenosis on 12/21/2011 with 2.5 x 10 mm angiosculpt balloon.  4) 02/20/2012 stenting of the circumflex coronary artery mid and proximal segment with implantation of a 2.5 x 32 mm Promus premier drug-eluting stent for recurrent in-stent restenosis.  5) 2.5 x 12 mm Xience drug-eluting stent into the proximal segment of the circumflex coronary artery for in-stent restenosis on 09/03/2012. She had occluded RI at the time which was balloon angioplasty performed.  She had been doing well 3 AM this morning, had chest pain and taken sublingual nitroglycerin. She had mild lingering chest discomfort that was difficult to state whether it was anginal but she states that although different she could not excluded this is angina pectoris. With a history of multiple coronary interventions in the past, mild elevated serum troponins EKG showing lateral ST segment depression with elevated cardiac markers for NSTEMI, patient is brought to the cardiac catheterization lab to reevaluate her coronary anatomy.  Hemodynamic data:  Aortic pressure was  149/48 with a mean of 83 mm mercury. LV 152/12/21 EDP mm Hg. No PG across the AV.  Right coronary artery: Is dominant, with a high-grade calcific 99% ostial stenosis. A small caliber vessel. Diffusely diseased. Occluded in the midsegment, long segment occlusion, of the origin of a small RV branch. Distal RCA is supplied by collaterals from the left system.  Left main coronary artery is large with mild diffuse calcification.  Circumflex coronary artery: A large vessel giving origin to a small obtuse marginal 1. Circumflex artery stents are widely patent with mild neointimal hyperplasia. The distal circumflex coronary artery has mild diffuse luminal irregularity. Supplies a large territory.  LAD: LAD gives origin to a large diagonal-1. LAD has mild luminal irregularities.  Ramus intermediate: The vessel is occluded in the proximal segment, compared to 12/04/2012, the vessel was mid occluded severely diffusely diseased and probably measures about 1.5 mm. previously placed stent is evident in the ramus intermediate. Faint type II collaterals from the ramus, take to distal segment of the ramus appears to be at least a 2.0-2.5 mm vessel. However this anatomy except for progression of proximal occlusion does not appear to have changed significantly. Is also supplied by collaterals from the left system.   Hospital Course: patient was directly admitted to the hospital via emergency room after she presented to Dr. Tanya [name] for evaluation of UTI that was started with antibiotics a week ago, had also complained about chest pain and shortness of breath and had taken sublingual nitroglycerin.  Because of her abnormal EKG, prior history of significant coronary artery disease and recurrent admission to the hospital, it was difficult to evaluate her symptoms.  She was evaluated in the emergency room by me and with her prior history of recurrent restenosis to the  circumflex coronary artery, it was felt that she should proceed  with coronary angiography directly with the plan to send her for CABG if there was recurrent restenosis.  However coronary angiography revealed excellent patency and small vessel disease  Which are stable.  Due to significant shortness of breath with exertion activity, history of fall 3 days prior to the admission with bruising of her right thigh, lower extremity venous duplex had not revealed any DVT, but during walking on the floor, she had complained of marked dyspnea.  She underwent CT angiogram of the chest to exclude pulmonary embolism, and it was negative for PE but did show reactive airway disease.   Recommendations on discharge: patient will need outpatient evaluation for thyromegaly will request Dr. Tanya [name]. I have started around Ranexa 500 mg by mouth twice a day to see whether her symptoms would improve.  Discharge Exam: Blood pressure 143/60, pulse 60, temperature 97.8 F (36.6 C), temperature source Oral, resp. rate 18, height 5\' 7"  (1.702 m), weight 87.998 kg (194 lb), SpO2 95.00%.   General appearance: alert, cooperative, no distress and mildly obese  Eyes: negative findings: lids and lashes normal  Neck: no adenopathy, no carotid bruit, no JVD, supple, symmetrical, trachea midline and thyroid not enlarged, symmetric, no tenderness/mass/nodules  Neck: JVP - normal, carotids 2+= without bruits  Resp: clear to auscultation bilaterally  Chest wall: no tenderness  Cardio: regular rate and rhythm, S1, S2 normal, no murmur, click, rub or gallop  GI: soft, non-tender; bowel sounds normal; no masses, no organomegaly  Extremities: extremities normal, right thigh has mild bruising without any hematoma or tenderness or signs of inflammation, no cyanosis. Chronic 1-2+ leg edema evident. Pitting edema below the knee.  Pulses: 2+ and symmetric, right femoral artery access site has healed well.    Labs:   Lab Results  Component Value Date   WBC 5.1 01/23/2013   HGB 9.2* 01/23/2013    HCT 27.3* 01/23/2013   MCV 92.5 01/23/2013   PLT 206 01/23/2013    Recent Labs Lab 01/23/13 0956  NA 140  K 4.1  CL 103  CO2 25  BUN 24*  CREATININE 1.24*  CALCIUM 9.3  GLUCOSE 101*   Lab Results  Component Value Date   CKTOTAL 309* 09/03/2012   CKMB 16.7* 09/03/2012   TROPONINI 0.33* 01/23/2013    Lipid Panel     Component Value Date/Time   CHOL 125 12/03/2012 0355   TRIG 42 12/03/2012 0355   HDL 72 12/03/2012 0355   CHOLHDL 1.7 12/03/2012 0355   VLDL 8 12/03/2012 0355   LDLCALC 45 12/03/2012 0355    EKG: unchanged from previous tracings. Sinus bradycardia at the rate of 55 bpm, LVH with repolarization abnormality, cannot exclude lateral wall ischemia.    Radiology: Ct Angio Chest Pe W/cm &/or Wo Cm: 01/23/2013:  IMPRESSION: 1. No CT findings for pulmonary embolism. 2. Normal thoracic aorta except for atherosclerotic changes. 3. Dense 3 vessel coronary artery calcifications. 4. Mosaic attenuation pattern in lungs most likely due to reactive airways disease, respiratory bronchiolitis or bronchiolitis obliterans. 5. Small bilateral pleural effusions and bibasilar atelectasis. 6. 3.5 cm isthmic thyroid mass. Recommend ultrasound correlation and consideration for biopsy.   Electronically Signed   By: Loralie Champagne M.D.   On: 01/23/2013 10:58   Dg Chest Port 1 View  01/22/2013   CLINICAL DATA:  79 year old female with chest pain and shortness of Breath. Recent fall. Initial encounter.  EXAM: PORTABLE CHEST - 1 VIEW  COMPARISON:  12/02/2012 and earlier.  FINDINGS: Portable AP semi upright view at 1144 hrs. Normal cardiac size and mediastinal contours. Visualized tracheal air column is within normal limits. No pneumothorax or pleural effusion. Interval regressed/ resolved bilateral perihilar and interstitial opacity. New No consolidation or worsening ventilation.  IMPRESSION: Interval resolved perihilar and interstitial opacity which probably was related to edema. No acute  cardiopulmonary abnormality.   Electronically Signed   By: Augusto Gamble M.D.   On: 01/22/2013 12:07      FOLLOW UP PLANS AND APPOINTMENTS  Future Appointments Provider Department Dept Phone   06/24/2013 12:15 PM Sherrie George, MD [facility] [phone]       Medication List         aspirin EC 81 MG tablet  Take 81 mg by mouth daily.     B-D ULTRAFINE III SHORT PEN 31G X 8 MM Misc  Generic drug:  Insulin Pen Needle  USE AS DIRECTED WITH LANTUS PEN     carvedilol 6.25 MG tablet  Commonly known as:  COREG  Take 6.25 mg by mouth 2 (two) times daily with a meal.     Fish Oil 1200 MG Caps  Take 1 capsule by mouth daily.     furosemide 40 MG tablet  Commonly known as:  LASIX  Take 0.5 tablets (20 mg total) by mouth daily as needed.     insulin lispro 100 UNIT/ML injection  Commonly known as:  HUMALOG  Inject 7-10 Units into the skin 3 (three) times daily before meals.     isosorbide mononitrate 60 MG 24 hr tablet  Commonly known as:  IMDUR  Take 1 tablet (60 mg total) by mouth daily.     LANTUS SOLOSTAR 100 UNIT/ML injection  Generic drug:  insulin glargine  Inject 20 Units into the skin at bedtime.     losartan 50 MG tablet  Commonly known as:  COZAAR  Take 0.5 tablets (25 mg total) by mouth daily.     nitroGLYCERIN 0.4 MG SL tablet  Commonly known as:  NITROSTAT  Place 0.4 mg under the tongue every 5 (five) minutes as needed for chest pain.     omeprazole 20 MG capsule  Commonly known as:  PRILOSEC  Take 20 mg by mouth 2 (two) times daily.     prasugrel 10 MG Tabs tablet  Commonly known as:  EFFIENT  Take 1 tablet (10 mg total) by mouth daily.     ranolazine 500 MG 12 hr tablet  Commonly known as:  RANEXA  Take 1 tablet (500 mg total) by mouth 2 (two) times daily.     simvastatin 20 MG tablet  Commonly known as:  ZOCOR  Take 1 tablet (20 mg total) by mouth every evening.           Follow-up Information   Call [facility]  R, MD. (to be seen in 3 weeks. Patient to come to office to pick up samples)    Specialty:  Cardiology   Contact information:   [address] [place] [address] [phone]        [name], MD 01/24/2013, 7:58 AM  Pager: [phone] Office: [phone] If no answer: [phone]

## 2013-02-01 ENCOUNTER — Other Ambulatory Visit: Payer: Self-pay | Admitting: Family Medicine

## 2013-02-11 ENCOUNTER — Ambulatory Visit (INDEPENDENT_AMBULATORY_CARE_PROVIDER_SITE_OTHER): Payer: Medicare HMO | Admitting: Family Medicine

## 2013-02-11 ENCOUNTER — Encounter: Payer: Self-pay | Admitting: Family Medicine

## 2013-02-11 VITALS — BP 98/48 | HR 58 | Temp 97.0°F | Resp 18 | Ht 67.0 in | Wt 191.0 lb

## 2013-02-11 DIAGNOSIS — E041 Nontoxic single thyroid nodule: Secondary | ICD-10-CM

## 2013-02-11 DIAGNOSIS — Z23 Encounter for immunization: Secondary | ICD-10-CM

## 2013-02-11 NOTE — Progress Notes (Signed)
Subjective:    Patient ID: Claudia [name], female    DOB: [birth year], 79 y.o.   MRN: [id number]  HPI Please see previous office visit.  Patient was admitted for atypical chest pain. Cardiac catheterization revealed nonocclusive coronary artery disease. She is to continue medical management. Present time she is on Lantus 20 units subcutaneous daily. She is taking regular insulin 7 units with meals. Her fasting blood sugars are 90-120. Her two-hour postprandial sugars were 200-250.  During the workup of her chest pain she was found to have a 3.5 cm thyroid mass on CT scan of the chest. He is completely asymptomatic for this lesion. I cannot appreciate a lesion on examination today. Past Medical History  Diagnosis Date  . GERD (gastroesophageal reflux disease)   . PUD (peptic ulcer disease)   . Anemia   . Hypertension   . S/P endoscopy Aug 2011    3 superficial gastric ulcers, NSAID-induced  . S/P colonoscopy Sept 2011    left-sided diverticula, tubular adenoma  . Coronary artery disease   . Shortness of breath   . Diabetes mellitus     insulin dependent  . Headache(784.0)     rare migraines  . Cancer     hx of skin cancer  . Arthritis   . Osteopenia   . Hypercholesterolemia   . SIADH (syndrome of inappropriate ADH production)   . CHF (congestive heart failure)   . Myocardial infarction 2013   Past Surgical History  Procedure Laterality Date  . Tonsillectomy    . Appendectomy    . Eye surgery      cataracts  . Cardiac stents  07/19/2011  . Esophagogastroduodenoscopy  10/16/09    normal without barrett's/three superficial gastric ulcers  . Colonoscopy  12/04/09    normal rectum/left-sided diverticula  . Joint replacement  arthroscopy to knee  . Toe amputation Left 2013    left second toe amputation  . Cardiac catheterization  01/22/2013  . Coronary angioplasty  07/2011   Current Outpatient Prescriptions on File Prior to Visit  Medication Sig Dispense Refill  . aspirin EC  81 MG tablet Take 81 mg by mouth daily.      . B-D ULTRAFINE III SHORT PEN 31G X 8 MM MISC USE AS DIRECTED WITH LANTUS PEN  100 each  5  . carvedilol (COREG) 6.25 MG tablet Take 6.25 mg by mouth 2 (two) times daily with a meal.       . furosemide (LASIX) 40 MG tablet Take 0.5 tablets (20 mg total) by mouth daily as needed.  30 tablet  11  . insulin glargine (LANTUS SOLOSTAR) 100 UNIT/ML injection Inject 20 Units into the skin at bedtime.      . isosorbide mononitrate (IMDUR) 60 MG 24 hr tablet Take 1 tablet (60 mg total) by mouth daily.  30 tablet  3  . losartan (COZAAR) 50 MG tablet Take 0.5 tablets (25 mg total) by mouth daily.  30 tablet  11  . nitroGLYCERIN (NITROSTAT) 0.4 MG SL tablet Place 0.4 mg under the tongue every 5 (five) minutes as needed for chest pain.      Marland Kitchen NOVOLOG FLEXPEN 100 UNIT/ML SOPN FlexPen INJECT 7 UNITS SUBCUTANEOUSLY THREE TIMES DAILY WITH MEALS  15 mL  5  . Omega-3 Fatty Acids (FISH OIL) 1200 MG CAPS Take 1 capsule by mouth daily.       Marland Kitchen omeprazole (PRILOSEC) 20 MG capsule Take 20 mg by mouth 2 (two) times daily.       Marland Kitchen  prasugrel (EFFIENT) 10 MG TABS Take 1 tablet (10 mg total) by mouth daily.  30 tablet  0  . ranolazine (RANEXA) 500 MG 12 hr tablet Take 1 tablet (500 mg total) by mouth 2 (two) times daily.  60 tablet  1  . simvastatin (ZOCOR) 20 MG tablet Take 1 tablet (20 mg total) by mouth every evening.  90 tablet  1   No current facility-administered medications on file prior to visit.   Allergies  Allergen Reactions  . Aspirin Other (See Comments)    Intolerant to high doses   History   Social History  . Marital Status: Widowed    Spouse Name: N/A    Number of Children: N/A  . Years of Education: N/A   Occupational History  . Not on file.   Social History Main Topics  . Smoking status: Never Smoker   . Smokeless tobacco: Never Used  . Alcohol Use: No  . Drug Use: No  . Sexual Activity: No   Other Topics Concern  . Not on file   Social  History Narrative  . No narrative on file      Review of Systems  All other systems reviewed and are negative.       Objective:   Physical Exam  Vitals reviewed. Neck: Neck supple. No thyromegaly present.  Cardiovascular: Normal rate, regular rhythm and normal heart sounds.   No murmur heard. Pulmonary/Chest: Effort normal and breath sounds normal. No respiratory distress. She has no wheezes. She has no rales.  Abdominal: Soft. Bowel sounds are normal. She exhibits no distension. There is no tenderness. There is no rebound.  Musculoskeletal: She exhibits edema.          Assessment & Plan:  1. Thyroid nodule Schedule US of the thyroid to better characterize this lesion. If thyroid nodule is confirmed I would schedule ultrasound guided biopsy to rule out malignancy. - US Soft Tissue Head/Neck; Future   I also recommended the patient increase regular insulin to 10-15 units with meals depending on her pre-meal blood sugar. The patient also received Prevnar 13 today in clinic.

## 2013-02-12 ENCOUNTER — Telehealth: Payer: Self-pay | Admitting: Family Medicine

## 2013-02-12 MED ORDER — ACCU-CHEK SMARTVIEW CONTROL VI LIQD: 1.0000 [drp] | Status: DC

## 2013-02-12 MED ORDER — ACCU-CHEK NANO SMARTVIEW W/DEVICE KIT: 1.0000 | PACK | Freq: Three times a day (TID) | Status: DC

## 2013-02-12 MED ORDER — ALCOHOL SWABS PADS: 1.0000 | MEDICATED_PAD | Freq: Three times a day (TID) | Status: DC

## 2013-02-12 MED ORDER — GLUCOSE BLOOD VI STRP: 1.0000 | ORAL_STRIP | Freq: Three times a day (TID) | Status: DC

## 2013-02-12 MED ORDER — ACCU-CHEK FASTCLIX LANCETS MISC: 1.0000 | Freq: Three times a day (TID) | Status: DC

## 2013-02-12 NOTE — Telephone Encounter (Signed)
Diabetic meter and supplies ordered

## 2013-02-13 MED ORDER — ACCU-CHEK NANO SMARTVIEW W/DEVICE KIT: PACK | Status: DC

## 2013-02-13 NOTE — Telephone Encounter (Signed)
Pt's son called and stated that we needed to call in her BS monitor and supplies to [employer] and this was done.

## 2013-02-14 ENCOUNTER — Ambulatory Visit (HOSPITAL_COMMUNITY)
Admission: RE | Admit: 2013-02-14 | Discharge: 2013-02-14 | Disposition: A | Discharge status: Home / Self Care | Payer: Medicare HMO | Source: Ambulatory Visit | Attending: Family Medicine | Admitting: Family Medicine

## 2013-02-14 DIAGNOSIS — E042 Nontoxic multinodular goiter: Secondary | ICD-10-CM | POA: Insufficient documentation

## 2013-02-14 DIAGNOSIS — E079 Disorder of thyroid, unspecified: Secondary | ICD-10-CM | POA: Insufficient documentation

## 2013-02-14 DIAGNOSIS — E041 Nontoxic single thyroid nodule: Secondary | ICD-10-CM

## 2013-02-15 ENCOUNTER — Other Ambulatory Visit: Payer: Self-pay | Admitting: Family Medicine

## 2013-02-15 DIAGNOSIS — E079 Disorder of thyroid, unspecified: Secondary | ICD-10-CM

## 2013-02-23 ENCOUNTER — Other Ambulatory Visit: Payer: Self-pay | Admitting: Family Medicine

## 2013-03-12 ENCOUNTER — Ambulatory Visit
Admission: RE | Admit: 2013-03-12 | Discharge: 2013-03-12 | Disposition: A | Discharge status: Home / Self Care | Payer: Commercial Managed Care - HMO | Source: Ambulatory Visit | Attending: Family Medicine | Admitting: Family Medicine

## 2013-03-12 ENCOUNTER — Other Ambulatory Visit (HOSPITAL_COMMUNITY)
Admission: RE | Admit: 2013-03-12 | Discharge: 2013-03-12 | Disposition: A | Discharge status: Home / Self Care | Payer: Medicare HMO | Source: Ambulatory Visit | Attending: Diagnostic Radiology | Admitting: Diagnostic Radiology

## 2013-03-12 DIAGNOSIS — E079 Disorder of thyroid, unspecified: Secondary | ICD-10-CM

## 2013-03-12 DIAGNOSIS — E041 Nontoxic single thyroid nodule: Secondary | ICD-10-CM | POA: Insufficient documentation

## 2013-06-24 ENCOUNTER — Ambulatory Visit (INDEPENDENT_AMBULATORY_CARE_PROVIDER_SITE_OTHER): Payer: Medicare HMO | Admitting: Ophthalmology

## 2013-06-24 DIAGNOSIS — H35039 Hypertensive retinopathy, unspecified eye: Secondary | ICD-10-CM

## 2013-06-24 DIAGNOSIS — E11359 Type 2 diabetes mellitus with proliferative diabetic retinopathy without macular edema: Secondary | ICD-10-CM

## 2013-06-24 DIAGNOSIS — E1165 Type 2 diabetes mellitus with hyperglycemia: Secondary | ICD-10-CM

## 2013-06-24 DIAGNOSIS — H43819 Vitreous degeneration, unspecified eye: Secondary | ICD-10-CM

## 2013-06-24 DIAGNOSIS — I10 Essential (primary) hypertension: Secondary | ICD-10-CM

## 2013-06-24 DIAGNOSIS — E1139 Type 2 diabetes mellitus with other diabetic ophthalmic complication: Secondary | ICD-10-CM

## 2013-06-26 ENCOUNTER — Emergency Department (HOSPITAL_COMMUNITY): Payer: Medicare HMO

## 2013-06-26 ENCOUNTER — Encounter (HOSPITAL_COMMUNITY)
Admission: EM | Disposition: A | Discharge status: Home / Self Care | Payer: Commercial Managed Care - HMO | Source: Home / Self Care | Attending: Cardiology

## 2013-06-26 ENCOUNTER — Inpatient Hospital Stay (HOSPITAL_COMMUNITY)
Admission: EM | Admit: 2013-06-26 | Discharge: 2013-06-28 | DRG: 250 | Disposition: A | Discharge status: Home / Self Care | Payer: Medicare HMO | Attending: Cardiology | Admitting: Cardiology

## 2013-06-26 ENCOUNTER — Encounter (HOSPITAL_COMMUNITY): Payer: Self-pay | Admitting: Emergency Medicine

## 2013-06-26 DIAGNOSIS — E7800 Pure hypercholesterolemia, unspecified: Secondary | ICD-10-CM | POA: Diagnosis present

## 2013-06-26 DIAGNOSIS — D649 Anemia, unspecified: Secondary | ICD-10-CM | POA: Diagnosis present

## 2013-06-26 DIAGNOSIS — N183 Chronic kidney disease, stage 3 unspecified: Secondary | ICD-10-CM | POA: Diagnosis present

## 2013-06-26 DIAGNOSIS — I249 Acute ischemic heart disease, unspecified: Secondary | ICD-10-CM | POA: Diagnosis present

## 2013-06-26 DIAGNOSIS — M129 Arthropathy, unspecified: Secondary | ICD-10-CM | POA: Diagnosis present

## 2013-06-26 DIAGNOSIS — Z96659 Presence of unspecified artificial knee joint: Secondary | ICD-10-CM

## 2013-06-26 DIAGNOSIS — S98139A Complete traumatic amputation of one unspecified lesser toe, initial encounter: Secondary | ICD-10-CM

## 2013-06-26 DIAGNOSIS — I129 Hypertensive chronic kidney disease with stage 1 through stage 4 chronic kidney disease, or unspecified chronic kidney disease: Secondary | ICD-10-CM | POA: Diagnosis present

## 2013-06-26 DIAGNOSIS — K219 Gastro-esophageal reflux disease without esophagitis: Secondary | ICD-10-CM | POA: Diagnosis present

## 2013-06-26 DIAGNOSIS — T82897A Other specified complication of cardiac prosthetic devices, implants and grafts, initial encounter: Principal | ICD-10-CM | POA: Diagnosis present

## 2013-06-26 DIAGNOSIS — I252 Old myocardial infarction: Secondary | ICD-10-CM

## 2013-06-26 DIAGNOSIS — Z794 Long term (current) use of insulin: Secondary | ICD-10-CM

## 2013-06-26 DIAGNOSIS — I214 Non-ST elevation (NSTEMI) myocardial infarction: Secondary | ICD-10-CM | POA: Diagnosis present

## 2013-06-26 DIAGNOSIS — E785 Hyperlipidemia, unspecified: Secondary | ICD-10-CM | POA: Diagnosis present

## 2013-06-26 DIAGNOSIS — I5033 Acute on chronic diastolic (congestive) heart failure: Secondary | ICD-10-CM | POA: Diagnosis present

## 2013-06-26 DIAGNOSIS — I2510 Atherosclerotic heart disease of native coronary artery without angina pectoris: Secondary | ICD-10-CM | POA: Diagnosis present

## 2013-06-26 DIAGNOSIS — Z8711 Personal history of peptic ulcer disease: Secondary | ICD-10-CM

## 2013-06-26 DIAGNOSIS — E1165 Type 2 diabetes mellitus with hyperglycemia: Secondary | ICD-10-CM

## 2013-06-26 DIAGNOSIS — E236 Other disorders of pituitary gland: Secondary | ICD-10-CM | POA: Diagnosis present

## 2013-06-26 DIAGNOSIS — Z7982 Long term (current) use of aspirin: Secondary | ICD-10-CM

## 2013-06-26 DIAGNOSIS — Y831 Surgical operation with implant of artificial internal device as the cause of abnormal reaction of the patient, or of later complication, without mention of misadventure at the time of the procedure: Secondary | ICD-10-CM | POA: Diagnosis present

## 2013-06-26 DIAGNOSIS — Y92009 Unspecified place in unspecified non-institutional (private) residence as the place of occurrence of the external cause: Secondary | ICD-10-CM

## 2013-06-26 DIAGNOSIS — IMO0001 Reserved for inherently not codable concepts without codable children: Secondary | ICD-10-CM | POA: Diagnosis present

## 2013-06-26 HISTORY — PX: LEFT HEART CATHETERIZATION WITH CORONARY ANGIOGRAM: SHX5451

## 2013-06-26 LAB — BASIC METABOLIC PANEL
BUN: 31 mg/dL — ABNORMAL HIGH (ref 6–23)
CALCIUM: 9.3 mg/dL (ref 8.4–10.5)
CHLORIDE: 94 meq/L — AB (ref 96–112)
CO2: 20 meq/L (ref 19–32)
Creatinine, Ser: 1.57 mg/dL — ABNORMAL HIGH (ref 0.50–1.10)
GFR calc Af Amer: 35 mL/min — ABNORMAL LOW (ref >90)
GFR calc non Af Amer: 30 mL/min — ABNORMAL LOW (ref >90)
Glucose, Bld: 264 mg/dL — ABNORMAL HIGH (ref 70–99)
POTASSIUM: 5.3 meq/L (ref 3.7–5.3)
SODIUM: 132 meq/L — AB (ref 137–147)

## 2013-06-26 LAB — CBC WITH DIFFERENTIAL/PLATELET
BASOS PCT: 0 % (ref 0–1)
Basophils Absolute: 0 10*3/uL (ref 0.0–0.1)
Eosinophils Absolute: 0.1 10*3/uL (ref 0.0–0.7)
Eosinophils Relative: 2 % (ref 0–5)
HCT: 33.3 % — ABNORMAL LOW (ref 36.0–46.0)
Hemoglobin: 11.2 g/dL — ABNORMAL LOW (ref 12.0–15.0)
LYMPHS ABS: 1.1 10*3/uL (ref 0.7–4.0)
Lymphocytes Relative: 18 % (ref 12–46)
MCH: 31.3 pg (ref 26.0–34.0)
MCHC: 33.6 g/dL (ref 30.0–36.0)
MCV: 93 fL (ref 78.0–100.0)
Monocytes Absolute: 0.6 10*3/uL (ref 0.1–1.0)
Monocytes Relative: 9 % (ref 3–12)
NEUTROS PCT: 71 % (ref 43–77)
Neutro Abs: 4.7 10*3/uL (ref 1.7–7.7)
PLATELETS: 233 10*3/uL (ref 150–400)
RBC: 3.58 MIL/uL — AB (ref 3.87–5.11)
RDW: 12.8 % (ref 11.5–15.5)
WBC: 6.5 10*3/uL (ref 4.0–10.5)

## 2013-06-26 LAB — COMPREHENSIVE METABOLIC PANEL
ALBUMIN: 3.8 g/dL (ref 3.5–5.2)
ALK PHOS: 102 U/L (ref 39–117)
ALT: 13 U/L (ref 0–35)
AST: 21 U/L (ref 0–37)
BUN: 32 mg/dL — ABNORMAL HIGH (ref 6–23)
CO2: 23 mEq/L (ref 19–32)
Calcium: 9.2 mg/dL (ref 8.4–10.5)
Chloride: 93 mEq/L — ABNORMAL LOW (ref 96–112)
Creatinine, Ser: 1.79 mg/dL — ABNORMAL HIGH (ref 0.50–1.10)
GFR calc Af Amer: 30 mL/min — ABNORMAL LOW (ref >90)
GFR calc non Af Amer: 26 mL/min — ABNORMAL LOW (ref >90)
Glucose, Bld: 293 mg/dL — ABNORMAL HIGH (ref 70–99)
POTASSIUM: 5.1 meq/L (ref 3.7–5.3)
SODIUM: 130 meq/L — AB (ref 137–147)
Total Bilirubin: 0.5 mg/dL (ref 0.3–1.2)
Total Protein: 7 g/dL (ref 6.0–8.3)

## 2013-06-26 LAB — GLUCOSE, CAPILLARY
GLUCOSE-CAPILLARY: 220 mg/dL — AB (ref 70–99)
GLUCOSE-CAPILLARY: 166 mg/dL — AB (ref 70–99)
GLUCOSE-CAPILLARY: 212 mg/dL — AB (ref 70–99)
Glucose-Capillary: 274 mg/dL — ABNORMAL HIGH (ref 70–99)
Glucose-Capillary: 178 mg/dL — ABNORMAL HIGH (ref 70–99)

## 2013-06-26 LAB — MRSA PCR SCREENING: MRSA BY PCR: NEGATIVE

## 2013-06-26 LAB — CK TOTAL AND CKMB (NOT AT ARMC)
CK TOTAL: 561 U/L — AB (ref 7–177)
CK, MB: 24.8 ng/mL (ref 0.3–4.0)
CK, MB: 37.1 ng/mL (ref 0.3–4.0)
Relative Index: 7.8 — ABNORMAL HIGH (ref 0.0–2.5)
Relative Index: 6.6 — ABNORMAL HIGH (ref 0.0–2.5)
Total CK: 318 U/L — ABNORMAL HIGH (ref 7–177)

## 2013-06-26 LAB — POCT ACTIVATED CLOTTING TIME
Activated Clotting Time: 132 seconds
Activated Clotting Time: 321 seconds

## 2013-06-26 LAB — I-STAT TROPONIN, ED: Troponin i, poc: 0.13 ng/mL (ref 0.00–0.08)

## 2013-06-26 LAB — TROPONIN I
TROPONIN I: 0.39 ng/mL — AB (ref <0.30)
TROPONIN I: 14.02 ng/mL — AB (ref <0.30)
TROPONIN I: 2.91 ng/mL — AB (ref <0.30)

## 2013-06-26 LAB — HEMOGLOBIN A1C
Hgb A1c MFr Bld: 7.7 % — ABNORMAL HIGH (ref <5.7)
MEAN PLASMA GLUCOSE: 174 mg/dL — AB (ref <117)

## 2013-06-26 LAB — PRO B NATRIURETIC PEPTIDE
PRO B NATRI PEPTIDE: 4422 pg/mL — AB (ref 0–450)
PRO B NATRI PEPTIDE: 5499 pg/mL — AB (ref 0–450)

## 2013-06-26 SURGERY — LEFT HEART CATHETERIZATION WITH CORONARY ANGIOGRAM
Anesthesia: LOCAL

## 2013-06-26 MED ORDER — SODIUM CHLORIDE 0.9 % IJ SOLN: 3.0000 mL | Freq: Two times a day (BID) | INTRAMUSCULAR | Status: DC

## 2013-06-26 MED ORDER — INSULIN ASPART 100 UNIT/ML ~~LOC~~ SOLN: 0.0000 [IU] | Freq: Every day | SUBCUTANEOUS | Status: DC

## 2013-06-26 MED ORDER — CARVEDILOL 6.25 MG PO TABS
6.2500 mg | ORAL_TABLET | Freq: Two times a day (BID) | ORAL | Status: DC
  Administered 2013-06-26 – 2013-06-28 (×4): 6.25 mg via ORAL
  Filled 2013-06-26 (×6): qty 1

## 2013-06-26 MED ORDER — OXYCODONE-ACETAMINOPHEN 5-325 MG PO TABS: 1.0000 | ORAL_TABLET | ORAL | Status: DC | PRN

## 2013-06-26 MED ORDER — FUROSEMIDE 10 MG/ML IJ SOLN
40.0000 mg | Freq: Once | INTRAMUSCULAR | Status: AC
  Administered 2013-06-26: 40 mg via INTRAVENOUS
  Filled 2013-06-26: qty 4

## 2013-06-26 MED ORDER — RANOLAZINE ER 500 MG PO TB12
500.0000 mg | ORAL_TABLET | Freq: Two times a day (BID) | ORAL | Status: DC
  Administered 2013-06-26 – 2013-06-28 (×5): 500 mg via ORAL
  Filled 2013-06-26 (×6): qty 1

## 2013-06-26 MED ORDER — INSULIN ASPART 100 UNIT/ML ~~LOC~~ SOLN: 0.0000 [IU] | Freq: Three times a day (TID) | SUBCUTANEOUS | Status: DC

## 2013-06-26 MED ORDER — NITROGLYCERIN IN D5W 200-5 MCG/ML-% IV SOLN: 5.0000 ug/min | INTRAVENOUS | Status: DC

## 2013-06-26 MED ORDER — ACETAMINOPHEN 325 MG PO TABS: 650.0000 mg | ORAL_TABLET | ORAL | Status: DC | PRN

## 2013-06-26 MED ORDER — OMEGA-3-ACID ETHYL ESTERS 1 G PO CAPS
2.0000 g | ORAL_CAPSULE | Freq: Two times a day (BID) | ORAL | Status: DC
  Administered 2013-06-26 – 2013-06-28 (×5): 2 g via ORAL
  Filled 2013-06-26 (×6): qty 2

## 2013-06-26 MED ORDER — HEPARIN (PORCINE) IN NACL 2-0.9 UNIT/ML-% IJ SOLN
INTRAMUSCULAR | Status: AC
  Filled 2013-06-26: qty 1000

## 2013-06-26 MED ORDER — SIMVASTATIN 20 MG PO TABS: 20.0000 mg | ORAL_TABLET | Freq: Every evening | ORAL | Status: DC

## 2013-06-26 MED ORDER — LOSARTAN POTASSIUM 25 MG PO TABS
25.0000 mg | ORAL_TABLET | Freq: Every day | ORAL | Status: DC
  Administered 2013-06-26 – 2013-06-28 (×3): 25 mg via ORAL
  Filled 2013-06-26 (×3): qty 1

## 2013-06-26 MED ORDER — NITROGLYCERIN 0.4 MG SL SUBL: 0.4000 mg | SUBLINGUAL_TABLET | SUBLINGUAL | Status: DC | PRN

## 2013-06-26 MED ORDER — PANTOPRAZOLE SODIUM 40 MG PO TBEC
40.0000 mg | DELAYED_RELEASE_TABLET | Freq: Every day | ORAL | Status: DC
  Administered 2013-06-26 – 2013-06-28 (×3): 40 mg via ORAL
  Filled 2013-06-26 (×3): qty 1

## 2013-06-26 MED ORDER — NITROGLYCERIN IN D5W 200-5 MCG/ML-% IV SOLN
5.0000 ug/min | Freq: Once | INTRAVENOUS | Status: AC
  Administered 2013-06-26: 5 ug/min via INTRAVENOUS
  Filled 2013-06-26: qty 250

## 2013-06-26 MED ORDER — SODIUM CHLORIDE 0.9 % IV SOLN: INTRAVENOUS | Status: DC

## 2013-06-26 MED ORDER — SODIUM CHLORIDE 0.9 % IJ SOLN: 3.0000 mL | INTRAMUSCULAR | Status: DC | PRN

## 2013-06-26 MED ORDER — NITROGLYCERIN 2 % TD OINT
1.0000 [in_us] | TOPICAL_OINTMENT | Freq: Once | TRANSDERMAL | Status: AC
  Administered 2013-06-26: 1 [in_us] via TOPICAL
  Filled 2013-06-26: qty 1

## 2013-06-26 MED ORDER — BIVALIRUDIN 250 MG IV SOLR
INTRAVENOUS | Status: AC
Start: 2013-06-26 — End: 2013-06-26
  Filled 2013-06-26: qty 250

## 2013-06-26 MED ORDER — NITROGLYCERIN 0.2 MG/ML ON CALL CATH LAB
INTRAVENOUS | Status: AC
  Filled 2013-06-26: qty 1

## 2013-06-26 MED ORDER — PRASUGREL HCL 10 MG PO TABS
10.0000 mg | ORAL_TABLET | Freq: Every day | ORAL | Status: DC
  Administered 2013-06-26 – 2013-06-28 (×3): 10 mg via ORAL
  Filled 2013-06-26 (×4): qty 1

## 2013-06-26 MED ORDER — SODIUM CHLORIDE 0.9 % IV SOLN: 1.0000 mL/kg/h | INTRAVENOUS | Status: AC

## 2013-06-26 MED ORDER — ALBUTEROL SULFATE (2.5 MG/3ML) 0.083% IN NEBU
2.5000 mg | INHALATION_SOLUTION | Freq: Once | RESPIRATORY_TRACT | Status: AC
  Administered 2013-06-26: 2.5 mg via RESPIRATORY_TRACT
  Filled 2013-06-26: qty 3

## 2013-06-26 MED ORDER — MIDAZOLAM HCL 2 MG/2ML IJ SOLN
INTRAMUSCULAR | Status: AC
  Filled 2013-06-26: qty 2

## 2013-06-26 MED ORDER — PRASUGREL HCL 10 MG PO TABS: 10.0000 mg | ORAL_TABLET | Freq: Every day | ORAL | Status: DC

## 2013-06-26 MED ORDER — ATORVASTATIN CALCIUM 80 MG PO TABS
80.0000 mg | ORAL_TABLET | Freq: Every day | ORAL | Status: DC
  Administered 2013-06-26 – 2013-06-27 (×2): 80 mg via ORAL
  Filled 2013-06-26 (×3): qty 1

## 2013-06-26 MED ORDER — MORPHINE SULFATE 2 MG/ML IJ SOLN
2.0000 mg | Freq: Once | INTRAMUSCULAR | Status: AC
  Administered 2013-06-26: 2 mg via INTRAVENOUS
  Filled 2013-06-26: qty 1

## 2013-06-26 MED ORDER — SIMVASTATIN 40 MG PO TABS: 40.0000 mg | ORAL_TABLET | Freq: Every day | ORAL | Status: DC

## 2013-06-26 MED ORDER — SODIUM CHLORIDE 0.9 % IV SOLN: 250.0000 mL | INTRAVENOUS | Status: DC | PRN

## 2013-06-26 MED ORDER — IPRATROPIUM-ALBUTEROL 0.5-2.5 (3) MG/3ML IN SOLN
3.0000 mL | Freq: Once | RESPIRATORY_TRACT | Status: AC
  Administered 2013-06-26: 3 mL via RESPIRATORY_TRACT
  Filled 2013-06-26: qty 3

## 2013-06-26 MED ORDER — FUROSEMIDE 10 MG/ML IJ SOLN: 40.0000 mg | Freq: Once | INTRAMUSCULAR | Status: DC

## 2013-06-26 MED ORDER — ONDANSETRON HCL 4 MG/2ML IJ SOLN
4.0000 mg | Freq: Four times a day (QID) | INTRAMUSCULAR | Status: DC | PRN
  Administered 2013-06-26: 4 mg via INTRAVENOUS
  Filled 2013-06-26: qty 2

## 2013-06-26 MED ORDER — LIDOCAINE HCL (PF) 1 % IJ SOLN
INTRAMUSCULAR | Status: AC
  Filled 2013-06-26: qty 30

## 2013-06-26 MED ORDER — ISOSORBIDE MONONITRATE ER 60 MG PO TB24
60.0000 mg | ORAL_TABLET | Freq: Every day | ORAL | Status: DC
  Administered 2013-06-26 – 2013-06-28 (×3): 60 mg via ORAL
  Filled 2013-06-26 (×3): qty 1

## 2013-06-26 MED ORDER — HEPARIN (PORCINE) IN NACL 100-0.45 UNIT/ML-% IJ SOLN
1000.0000 [IU]/h | INTRAMUSCULAR | Status: DC
  Administered 2013-06-26: 1000 [IU]/h via INTRAVENOUS
  Filled 2013-06-26: qty 250

## 2013-06-26 MED ORDER — ASPIRIN 81 MG PO CHEW
CHEWABLE_TABLET | ORAL | Status: AC
  Filled 2013-06-26: qty 4

## 2013-06-26 MED ORDER — PRASUGREL HCL 10 MG PO TABS
ORAL_TABLET | ORAL | Status: AC
  Administered 2013-06-26: 10 mg via ORAL
  Filled 2013-06-26: qty 1

## 2013-06-26 MED ORDER — INSULIN GLARGINE 100 UNIT/ML ~~LOC~~ SOLN
20.0000 [IU] | Freq: Every day | SUBCUTANEOUS | Status: DC
  Administered 2013-06-26 – 2013-06-27 (×2): 20 [IU] via SUBCUTANEOUS
  Filled 2013-06-26 (×4): qty 0.2

## 2013-06-26 MED ORDER — ALBUTEROL SULFATE (2.5 MG/3ML) 0.083% IN NEBU: 5.0000 mg | INHALATION_SOLUTION | Freq: Once | RESPIRATORY_TRACT | Status: DC

## 2013-06-26 MED ORDER — ASPIRIN EC 81 MG PO TBEC
81.0000 mg | DELAYED_RELEASE_TABLET | Freq: Every day | ORAL | Status: DC
  Administered 2013-06-27 – 2013-06-28 (×2): 81 mg via ORAL
  Filled 2013-06-26 (×2): qty 1

## 2013-06-26 MED ORDER — ASPIRIN 81 MG PO CHEW
324.0000 mg | CHEWABLE_TABLET | Freq: Once | ORAL | Status: AC
  Administered 2013-06-26: 324 mg via ORAL

## 2013-06-26 MED ORDER — INSULIN ASPART 100 UNIT/ML ~~LOC~~ SOLN
3.0000 [IU] | Freq: Three times a day (TID) | SUBCUTANEOUS | Status: DC
Start: 2013-06-27 — End: 2013-06-28
  Administered 2013-06-26 – 2013-06-28 (×5): 3 [IU] via SUBCUTANEOUS

## 2013-06-26 MED ORDER — FENTANYL CITRATE 0.05 MG/ML IJ SOLN
INTRAMUSCULAR | Status: AC
  Filled 2013-06-26: qty 2

## 2013-06-26 MED ORDER — IPRATROPIUM BROMIDE 0.02 % IN SOLN: 0.5000 mg | Freq: Once | RESPIRATORY_TRACT | Status: DC

## 2013-06-26 MED ORDER — INSULIN ASPART 100 UNIT/ML ~~LOC~~ SOLN
0.0000 [IU] | Freq: Three times a day (TID) | SUBCUTANEOUS | Status: DC
  Administered 2013-06-26 – 2013-06-27 (×4): 3 [IU] via SUBCUTANEOUS
  Administered 2013-06-28: 09:00:00 2 [IU] via SUBCUTANEOUS
  Administered 2013-06-28: 13:00:00 3 [IU] via SUBCUTANEOUS

## 2013-06-26 NOTE — ED Notes (Signed)
Pt co chest pain at rest, central chest pain, pt has had prior MI, 6 cardiac caths. Pain started at 9pm while watching television. Pain accompanied by nausea, and SOB.

## 2013-06-26 NOTE — Progress Notes (Signed)
ANTICOAGULATION CONSULT NOTE - Initial Consult  Pharmacy Consult for Heparin Indication: chest pain/ACS  Allergies  Allergen Reactions  . Aspirin Other (See Comments)    Intolerant to high doses    Patient Measurements: Height: 5' 7"  (170.2 cm) Weight: 193 lb 5.5 oz (87.7 kg) IBW/kg (Calculated) : 61.6 Heparin Dosing Weight: 80 kg  Vital Signs: Temp: 97.4 F (36.3 C) (04/22 0400) Temp src: Oral (04/22 0400) BP: 132/53 mmHg (04/22 0400) Pulse Rate: 82 (04/22 0400)  Labs:  Recent Labs  06/26/13 0029 06/26/13 0042  HGB 11.2*  --   HCT 33.3*  --   PLT 233  --   CREATININE 1.79*  --   TROPONINI  --  0.39*    Estimated Creatinine Clearance: 29 ml/min (by C-G formula based on Cr of 1.79).   Medical History: Past Medical History  Diagnosis Date  . GERD (gastroesophageal reflux disease)   . PUD (peptic ulcer disease)   . Anemia   . Hypertension   . S/P endoscopy Aug 2011    3 superficial gastric ulcers, NSAID-induced  . S/P colonoscopy Sept 2011    left-sided diverticula, tubular adenoma  . Coronary artery disease   . Shortness of breath   . Diabetes mellitus     insulin dependent  . Headache(784.0)     rare migraines  . Cancer     hx of skin cancer  . Arthritis   . Osteopenia   . Hypercholesterolemia   . SIADH (syndrome of inappropriate ADH production)   . CHF (congestive heart failure)   . Myocardial infarction 2013    Medications:  Prescriptions prior to admission  Medication Sig Dispense Refill  . aspirin EC 81 MG tablet Take 81 mg by mouth daily.      . B-D ULTRAFINE III SHORT PEN 31G X 8 MM MISC USE AS DIRECTED WITH LANTUS PEN  100 each  5  . Blood Glucose Monitoring Suppl (ACCU-CHEK NANO SMARTVIEW) W/DEVICE KIT Pt needs monitor, strips (box of 400/ 4 refill), lancets (box of 400/4 refills) She checks her BS qid (before meals & QHS) Dx: 250.00  1 kit  0  . carvedilol (COREG) 6.25 MG tablet Take 6.25 mg by mouth 2 (two) times daily with a meal.        . furosemide (LASIX) 40 MG tablet Take 0.5 tablets (20 mg total) by mouth daily as needed.  30 tablet  11  . insulin glargine (LANTUS SOLOSTAR) 100 UNIT/ML injection Inject 20 Units into the skin at bedtime.      . isosorbide mononitrate (IMDUR) 60 MG 24 hr tablet Take 1 tablet (60 mg total) by mouth daily.  30 tablet  3  . losartan (COZAAR) 50 MG tablet Take 0.5 tablets (25 mg total) by mouth daily.  30 tablet  11  . nitroGLYCERIN (NITROSTAT) 0.4 MG SL tablet Place 0.4 mg under the tongue every 5 (five) minutes as needed for chest pain.      Marland Kitchen NOVOLOG FLEXPEN 100 UNIT/ML SOPN FlexPen INJECT 7 UNITS SUBCUTANEOUSLY THREE TIMES DAILY WITH MEALS  15 mL  5  . Omega-3 Fatty Acids (FISH OIL) 1200 MG CAPS Take 1 capsule by mouth daily.       Marland Kitchen omeprazole (PRILOSEC) 20 MG capsule Take 20 mg by mouth 2 (two) times daily.       . prasugrel (EFFIENT) 10 MG TABS Take 1 tablet (10 mg total) by mouth daily.  30 tablet  0  . ranolazine (RANEXA) 500 MG  12 hr tablet Take 1 tablet (500 mg total) by mouth 2 (two) times daily.  60 tablet  1  . simvastatin (ZOCOR) 20 MG tablet TAKE 1 TABLET BY MOUTH EVERY EVENING  90 tablet  3  . simvastatin (ZOCOR) 20 MG tablet Take 1 tablet (20 mg total) by mouth every evening.  90 tablet  1    Assessment: 80 yo female with chest pain for heparin.  Hepaarin started at [facility] at 0200  Goal of Therapy:  Heparin level 0.3-0.7 units/ml Monitor platelets by anticoagulation protocol: Yes   Plan:  Continue Heparin at current rate  Check heparin level at [place] 06/26/2013,4:26 AM

## 2013-06-26 NOTE — Progress Notes (Signed)
UR completed [place] K. Jeanpierre Thebeau, RN, BSN, [place], CCM  06/26/2013 2:28 PM

## 2013-06-26 NOTE — Progress Notes (Signed)
CRITICAL VALUE ALERT  Critical value received:  MB 37.1, TROP 14.02  Date of notification:  06/26/13  Time of notification:  1700  Critical value read back:yes  Nurse who received alert:  Chales Salmon, RN  MD notified (1st page):  Christen Butter  Time of first page:  39  MD notified (2nd page):  Time of second page:  Responding MD:  Christen Butter  Time MD responded:  [phone]

## 2013-06-26 NOTE — Progress Notes (Signed)
Pt transferred into [facility] via [facility]. Pt awake, oriented x4, able to move well. Pt VSS, on 5 mcg IV nitro/hr, and 1000 units heparin/hr. Pt states chest pain in 0/10 at this time and has no SOB. MD Einar Gip called, updated and new orders received. Family at bedside and updated. Will continue to monitor.

## 2013-06-26 NOTE — Interval H&P Note (Signed)
History and Physical Interval Note:  06/26/2013 8:30 AM  Claudia [name]  has presented today for surgery, with the diagnosis of cp  The various methods of treatment have been discussed with the patient and family. After consideration of risks, benefits and other options for treatment, the patient has consented to  Procedure(s): LEFT HEART CATHETERIZATION WITH CORONARY ANGIOGRAM (N/A) and possible PCI as a surgical intervention .  The patient's history has been reviewed, patient examined, no change in status, stable for surgery.  I have reviewed the patient's chart and labs.  Questions were answered to the patient's satisfaction.     Laverda Page

## 2013-06-26 NOTE — Care Management Note (Addendum)
  Page 1 of 1   06/28/2013     12:02:00 PM CARE MANAGEMENT NOTE 06/28/2013  Patient:  Claudia [name], Claudia [name]   Account Number:  [id number]  Date Initiated:  06/26/2013  Documentation initiated by:  Mariann Laster  Subjective/Objective Assessment:   Admitted with Chest Pain     Action/Plan:   CM to follow for disposition needs   Anticipated DC Date:  06/28/2013   Anticipated DC Plan:  [place]  CM consult      Choice offered to / List presented to:             Status of service:  Completed, signed off Medicare Important Message given?   (If response is "NO", the following Medicare IM given date fields will be blank) Date Medicare IM given:   Date Additional Medicare IM given:    Discharge Disposition:  HOME/SELF CARE  Per UR Regulation:  Reviewed for med. necessity/level of care/duration of stay  If discussed at [place] of Stay Meetings, dates discussed:    Comments:  06/28/2013 Social:  From home.  Patient states she is active most of time. THN:  Active with Nurse Rose No other CM needs identified this admission. Ladislav Caselli RN, BSN, M[facility], CCM 06/28/2013  LEFT HEART CATHETERIZATION WITH CORONARY ANGIOGRAM on 06/26/2013 Benefits Check: prasugrel (EFFIENT) tablet 10 mg  :  Dose 10 mg  :  Oral  : Daily per [facility] pharmacy: is covered, $44.38 co-pay at retail, no auth required, patient can use any pharmacy Dispostion Pending [place] RN, BSN, [place], CCM 06/26/2013

## 2013-06-26 NOTE — ED Provider Notes (Addendum)
CSN: [id number]     Arrival date & time 06/26/13  0010 History  This chart was scribed for Veryl Speak, MD by Jenne Campus, ED Scribe. This patient was seen in room APA18/APA18 and the patient's care was started at 12:30 AM.   Chief Complaint  Patient presents with  . Chest Pain    The history is provided by the patient. No language interpreter was used.   HPI Comments: Claudia [name] is a 80 y.o. female who presents to the Emergency Department complaining of intermittent mid sternal to right-sided chest pain for the past 3 nights. She states that she thought the pain was originally gas until it became persistent tonight and was accompanied with SOB, increased bilateral leg swelling and nausea. She states that she took 2 nitroglycerin that did not improve symptoms and rates her pain a 5 out of 10 currently. She has a h/o prior MI with cardiac stent placement. She is unable to compare the episodes stating that she had different symptoms with her prior MIs. She states that she has a Film/video editor appointment in 3 days. Last cardiac cath was in November 2014 and she reports that it was "clean".   Cardiologist Dr. Albesa Seen   Past Medical History  Diagnosis Date  . GERD (gastroesophageal reflux disease)   . PUD (peptic ulcer disease)   . Anemia   . Hypertension   . S/P endoscopy Aug 2011    3 superficial gastric ulcers, NSAID-induced  . S/P colonoscopy Sept 2011    left-sided diverticula, tubular adenoma  . Coronary artery disease   . Shortness of breath   . Diabetes mellitus     insulin dependent  . Headache(784.0)     rare migraines  . Cancer     hx of skin cancer  . Arthritis   . Osteopenia   . Hypercholesterolemia   . SIADH (syndrome of inappropriate ADH production)   . CHF (congestive heart failure)   . Myocardial infarction 2013   Past Surgical History  Procedure Laterality Date  . Tonsillectomy    . Appendectomy    . Eye surgery      cataracts  . Cardiac stents   07/19/2011  . Esophagogastroduodenoscopy  10/16/09    normal without barrett's/three superficial gastric ulcers  . Colonoscopy  12/04/09    normal rectum/left-sided diverticula  . Joint replacement  arthroscopy to knee  . Toe amputation Left 2013    left second toe amputation  . Cardiac catheterization  01/22/2013  . Coronary angioplasty  07/2011   History reviewed. No pertinent family history. History  Substance Use Topics  . Smoking status: Never Smoker   . Smokeless tobacco: Never Used  . Alcohol Use: No   No OB history provided.  Review of Systems  A complete 10 system review of systems was obtained and all systems are negative except as noted in the HPI and PMH.    Allergies  Aspirin  Home Medications   Prior to Admission medications   Medication Sig Start Date End Date Taking? Authorizing Provider  aspirin EC 81 MG tablet Take 81 mg by mouth daily.   Yes Historical Provider, MD  B-D ULTRAFINE III SHORT PEN 31G X 8 MM MISC USE AS DIRECTED WITH LANTUS PEN 11/21/12  Yes Susy Frizzle, MD  Blood Glucose Monitoring Suppl (ACCU-CHEK NANO SMARTVIEW) W/DEVICE KIT Pt needs monitor, strips (box of 400/ 4 refill), lancets (box of 400/4 refills) She checks her BS qid (before meals & QHS) Dx:  250.00 02/13/13  Yes Susy Frizzle, MD  carvedilol (COREG) 6.25 MG tablet Take 6.25 mg by mouth 2 (two) times daily with a meal.    Yes Historical Provider, MD  furosemide (LASIX) 40 MG tablet Take 0.5 tablets (20 mg total) by mouth daily as needed. 06/25/12  Yes Susy Frizzle, MD  insulin glargine (LANTUS SOLOSTAR) 100 UNIT/ML injection Inject 20 Units into the skin at bedtime.   Yes Historical Provider, MD  isosorbide mononitrate (IMDUR) 60 MG 24 hr tablet Take 1 tablet (60 mg total) by mouth daily. 09/05/12  Yes Laverda Page, MD  losartan (COZAAR) 50 MG tablet Take 0.5 tablets (25 mg total) by mouth daily. 06/25/12  Yes Susy Frizzle, MD  nitroGLYCERIN (NITROSTAT) 0.4 MG SL tablet  Place 0.4 mg under the tongue every 5 (five) minutes as needed for chest pain.   Yes Historical Provider, MD  NOVOLOG FLEXPEN 100 UNIT/ML SOPN FlexPen INJECT 7 UNITS SUBCUTANEOUSLY THREE TIMES DAILY WITH MEALS 02/01/13  Yes Susy Frizzle, MD  Omega-3 Fatty Acids (FISH OIL) 1200 MG CAPS Take 1 capsule by mouth daily.    Yes Historical Provider, MD  omeprazole (PRILOSEC) 20 MG capsule Take 20 mg by mouth 2 (two) times daily.    Yes Historical Provider, MD  prasugrel (EFFIENT) 10 MG TABS Take 1 tablet (10 mg total) by mouth daily. 07/20/11  Yes Laverda Page, MD  ranolazine (RANEXA) 500 MG 12 hr tablet Take 1 tablet (500 mg total) by mouth 2 (two) times daily. 01/23/13  Yes Laverda Page, MD  simvastatin (ZOCOR) 20 MG tablet TAKE 1 TABLET BY MOUTH EVERY EVENING 02/23/13  Yes Susy Frizzle, MD  simvastatin (ZOCOR) 20 MG tablet Take 1 tablet (20 mg total) by mouth every evening. 08/15/12   Susy Frizzle, MD   Triage vitals: BP 153/72  Pulse 88  Temp(Src) 97.6 F (36.4 C) (Oral)  Resp 20  Ht _0  (1.702 m)  Wt 188 lb (85.276 kg)  BMI 29.44 kg/m2  SpO2 97%  Physical Exam  Nursing note and vitals reviewed. Constitutional: She is oriented to person, place, and time. She appears well-developed and well-nourished. No distress.  HENT:  Head: Normocephalic and atraumatic.  Eyes: Conjunctivae and EOM are normal.  Neck: Normal range of motion. Neck supple. No tracheal deviation present.  Cardiovascular: Normal rate, regular rhythm and normal heart sounds.   No murmur heard. Pulmonary/Chest: Effort normal and breath sounds normal. No respiratory distress. She has no wheezes. She has no rales.  Abdominal: Soft. Bowel sounds are normal. There is no tenderness.  Musculoskeletal: Normal range of motion. She exhibits edema.  2-3+ pitting edema bilaterally in lower extremities.   Neurological: She is alert and oriented to person, place, and time. No cranial nerve deficit.  Skin: Skin is  warm and dry.  Psychiatric: She has a normal mood and affect. Her behavior is normal.    ED Course  Procedures (including critical care time)  Medications  morphine 2 MG/ML injection 2 mg (not administered)  nitroGLYCERIN (NITROGLYN) 2 % ointment 1 inch (1 inch Topical Given 06/26/13 0108)    DIAGNOSTIC STUDIES: Oxygen Saturation is 97% on RA, Adequate by my interpretation.    COORDINATION OF CARE: 12:38 AM-Informed pt of concerning EKG changes. Discussed treatment plan which includes medications, CXR, CBC panel, CMP and UA with pt at bedside and pt agreed to plan.     Labs Review Labs Reviewed  CBC WITH DIFFERENTIAL  COMPREHENSIVE METABOLIC PANEL  I-STAT TROPOININ, ED    Imaging Review No results found.   EKG Interpretation   Date/Time:  Wednesday June 26 2013 00:13:58 EDT Ventricular Rate:  90 PR Interval:  182 QRS Duration: 106 QT Interval:  368 QTC Calculation: 450 R Axis:   4 Text Interpretation:  Normal sinus rhythm Possible Left atrial enlargement  Septal infarct , age undetermined Marked ST abnormality, possible  inferolateral subendocardial injury Abnormal ECG When compared with ECG of  22-Jan-2013 10:17, Significant changes have occurred Confirmed by DELOS   MD, Callaway Hailes ([id number]) on 06/26/2013 12:22:09 AM      MDM   Final diagnoses:  None    Patient is a 80 year old female with history of coronary artery disease with multiple stents. She presents today with a recurrence of her chest discomfort. Her initial EKG reveals significant ST depressions that are new when compared to prior study. She is also found to have a troponin of 0.39. She was given aspirin and nitroglycerin and morphine which has improved her discomfort and is now nearly pain free.  She is also found to have findings consistent with congestive heart failure. She was also given Lasix for this.  I discussed the case with Dr. Einar Gip who is her cardiologist and is very familiar with her. He is  recommending transfer to [facility] for further evaluation. He is recommending we start heparin. She will be admitted to step down under his care.   I personally performed the services described in this documentation, which was scribed in my presence. The recorded information has been reviewed and is accurate.      Veryl Speak, MD 06/26/13 [phone]   CRITICAL CARE Performed by: Veryl Speak Total critical care time: 30 minutes Critical care time was exclusive of separately billable procedures and treating other patients. Critical care was necessary to treat or prevent imminent or life-threatening deterioration. Critical care was time spent personally by me on the following activities: development of treatment plan with patient and/or surrogate as well as nursing, discussions with consultants, evaluation of patient's response to treatment, examination of patient, obtaining history from patient or surrogate, ordering and performing treatments and interventions, ordering and review of laboratory studies, ordering and review of radiographic studies, pulse oximetry and re-evaluation of patient's condition.   Veryl Speak, MD 06/26/13 [phone]

## 2013-06-26 NOTE — ED Notes (Signed)
Attempted to call report. Was advised nurse receiving pt will call this nurse back for report. 

## 2013-06-26 NOTE — H&P (Signed)
Claudia [name] is an 80 y.o. female.   Chief Complaint: Chest pain and shortness of breath HPI: Patient is a 80 year old Caucasian female with history of known coronary artery disease and has undergone multiple coronary intervention specifically involving the coronaries of the left system, and she has had recurrent in-stent restenosis involving the circumflex coronary artery and also ramus intermediate ranch of the circumflex coronary artery. On her previous admission on 12/02/2012, when she presented similarly with NSTEMI, we had contemplated consideration for CABG, however she had had significant progression of native vessel disease and single culprit stenosis and hence underwent repeat angioplasty to the ostial circumflex coronary artery with implantation of a 2.75 x 12 mm promos Premier drug-eluting stent.  Patient had been doing well until over the past weekend on Sunday, started this pain in her chest, associated with burping, and coughing. She thought this was related to GI issues, however chest discomfort persisted on and off. Since then she's been having frequent episodes of chest tightness similar to angina pectoris and she had taken 2 sublingual nitroglycerin yesterday morning without fever. She has also been coughing incessantly, dry cough, especially worse when she lays down. She eventually presented to the emergency room last evening and she had more pronounced ST segment depression in inferior and lateral leads and troponin was minimally positive. Hence with her significant cardiac history and transferred to the hospital under stepped-down unit. Since being on IV heparin and nitroglycerin she has not had any further episodes of chest pain.  Her son and daughter-in-law's sister present at the bedside. Patient is also noticed worsening leg edema since Sunday night. Blood sugar continued to be uncontrolled. No hemoptysis, no painful swelling of the lower extremity. No palpitations, dizziness or  syncope. Denies symptoms to suggest claudication.    Past Medical History  Diagnosis Date  . GERD (gastroesophageal reflux disease)   . PUD (peptic ulcer disease)   . Anemia   . Hypertension   . S/P endoscopy Aug 2011    3 superficial gastric ulcers, NSAID-induced  . S/P colonoscopy Sept 2011    left-sided diverticula, tubular adenoma  . Coronary artery disease   . Shortness of breath   . Diabetes mellitus     insulin dependent  . Headache(784.0)     rare migraines  . Cancer     hx of skin cancer  . Arthritis   . Osteopenia   . Hypercholesterolemia   . SIADH (syndrome of inappropriate ADH production)   . CHF (congestive heart failure)   . Myocardial infarction 2013    Past Surgical History  Procedure Laterality Date  . Tonsillectomy    . Appendectomy    . Eye surgery      cataracts  . Cardiac stents  07/19/2011  . Esophagogastroduodenoscopy  10/16/09    normal without barrett's/three superficial gastric ulcers  . Colonoscopy  12/04/09    normal rectum/left-sided diverticula  . Joint replacement  arthroscopy to knee  . Toe amputation Left 2013    left second toe amputation  . Cardiac catheterization  01/22/2013  . Coronary angioplasty  07/2011    History reviewed. No pertinent family history. Social History:  reports that she has never smoked. She has never used smokeless tobacco. She reports that she does not drink alcohol or use illicit drugs.  Allergies:  Allergies  Allergen Reactions  . Aspirin Other (See Comments)    Intolerant to high doses    Medications Prior to Admission  Medication Sig Dispense Refill  .  aspirin EC 81 MG tablet Take 81 mg by mouth daily.      . B-D ULTRAFINE III SHORT PEN 31G X 8 MM MISC USE AS DIRECTED WITH LANTUS PEN  100 each  5  . Blood Glucose Monitoring Suppl (ACCU-CHEK NANO SMARTVIEW) W/DEVICE KIT Pt needs monitor, strips (box of 400/ 4 refill), lancets (box of 400/4 refills) She checks her BS qid (before meals & QHS) Dx:  250.00  1 kit  0  . carvedilol (COREG) 6.25 MG tablet Take 6.25 mg by mouth 2 (two) times daily with a meal.       . furosemide (LASIX) 40 MG tablet Take 0.5 tablets (20 mg total) by mouth daily as needed.  30 tablet  11  . insulin glargine (LANTUS SOLOSTAR) 100 UNIT/ML injection Inject 20 Units into the skin at bedtime.      . isosorbide mononitrate (IMDUR) 60 MG 24 hr tablet Take 1 tablet (60 mg total) by mouth daily.  30 tablet  3  . losartan (COZAAR) 50 MG tablet Take 0.5 tablets (25 mg total) by mouth daily.  30 tablet  11  . nitroGLYCERIN (NITROSTAT) 0.4 MG SL tablet Place 0.4 mg under the tongue every 5 (five) minutes as needed for chest pain.      Marland Kitchen NOVOLOG FLEXPEN 100 UNIT/ML SOPN FlexPen INJECT 7 UNITS SUBCUTANEOUSLY THREE TIMES DAILY WITH MEALS  15 mL  5  . Omega-3 Fatty Acids (FISH OIL) 1200 MG CAPS Take 1 capsule by mouth daily.       Marland Kitchen omeprazole (PRILOSEC) 20 MG capsule Take 20 mg by mouth 2 (two) times daily.       . prasugrel (EFFIENT) 10 MG TABS Take 1 tablet (10 mg total) by mouth daily.  30 tablet  0  . ranolazine (RANEXA) 500 MG 12 hr tablet Take 1 tablet (500 mg total) by mouth 2 (two) times daily.  60 tablet  1  . simvastatin (ZOCOR) 20 MG tablet TAKE 1 TABLET BY MOUTH EVERY EVENING  90 tablet  3  . simvastatin (ZOCOR) 20 MG tablet Take 1 tablet (20 mg total) by mouth every evening.  90 tablet  1      Review of Systems - no dark stools or bloody stools. No recent weight changes. Diabetes mellitus uncontrolled. No symptoms to suggest TIA or claudication. Other systems negative.  Blood pressure 149/66, pulse 82, temperature 97.4 F (36.3 C), temperature source Oral, resp. rate 14, height 5' 7"  (1.702 m), weight 87.7 kg (193 lb 5.5 oz), SpO2 100.00%. General appearance: alert, cooperative, no distress and mildly obese  Eyes: negative findings: lids and lashes normal  Neck: no adenopathy, no carotid bruit, no JVD, supple, symmetrical, trachea midline and thyroid not  enlarged, symmetric, no tenderness/mass/nodules  Neck: JVP - normal, carotids 2+= without bruits  Resp: Fine crackles heard at the bases bilaterally  Chest wall: no tenderness  Cardio: regular rate and rhythm, S1, S2 normal, no murmur, click, rub or gallop  GI: soft, non-tender; bowel sounds normal; no masses, no organomegaly  Extremities: extremities normal, right thigh has mild bruising without any hematoma or tenderness or signs of inflammation, no cyanosis. Chronic 1-2+ leg edema evident. Pitting edema below the knee.  Pulses: Absent right radial pulse otherwise 2+  and symmetric  Results for orders placed during the hospital encounter of 06/26/13 (from the past 48 hour(s))  CBC WITH DIFFERENTIAL     Status: Abnormal   Collection Time    06/26/13 12:29 AM  Result Value Ref Range   WBC 6.5  4.0 - 10.5 K/uL   RBC 3.58 (*) 3.87 - 5.11 MIL/uL   Hemoglobin 11.2 (*) 12.0 - 15.0 g/dL   HCT 33.3 (*) 36.0 - 46.0 %   MCV 93.0  78.0 - 100.0 fL   MCH 31.3  26.0 - 34.0 pg   MCHC 33.6  30.0 - 36.0 g/dL   RDW 12.8  11.5 - 15.5 %   Platelets 233  150 - 400 K/uL   Neutrophils Relative % 71  43 - 77 %   Neutro Abs 4.7  1.7 - 7.7 K/uL   Lymphocytes Relative 18  12 - 46 %   Lymphs Abs 1.1  0.7 - 4.0 K/uL   Monocytes Relative 9  3 - 12 %   Monocytes Absolute 0.6  0.1 - 1.0 K/uL   Eosinophils Relative 2  0 - 5 %   Eosinophils Absolute 0.1  0.0 - 0.7 K/uL   Basophils Relative 0  0 - 1 %   Basophils Absolute 0.0  0.0 - 0.1 K/uL  COMPREHENSIVE METABOLIC PANEL     Status: Abnormal   Collection Time    06/26/13 12:29 AM      Result Value Ref Range   Sodium 130 (*) 137 - 147 mEq/L   Potassium 5.1  3.7 - 5.3 mEq/L   Chloride 93 (*) 96 - 112 mEq/L   CO2 23  19 - 32 mEq/L   Glucose, Bld 293 (*) 70 - 99 mg/dL   BUN 32 (*) 6 - 23 mg/dL   Creatinine, Ser 1.79 (*) 0.50 - 1.10 mg/dL   Calcium 9.2  8.4 - 10.5 mg/dL   Total Protein 7.0  6.0 - 8.3 g/dL   Albumin 3.8  3.5 - 5.2 g/dL   AST 21  0 - 37  U/L   ALT 13  0 - 35 U/L   Alkaline Phosphatase 102  39 - 117 U/L   Total Bilirubin 0.5  0.3 - 1.2 mg/dL   GFR calc non Af Amer 26 (*) >90 mL/min   GFR calc Af Amer 30 (*) >90 mL/min   Comment: (NOTE)     The eGFR has been calculated using the CKD EPI equation.     This calculation has not been validated in all clinical situations.     eGFR's persistently <90 mL/min signify possible Chronic Kidney     Disease.  Randolm Idol, ED     Status: Abnormal   Collection Time    06/26/13 12:32 AM      Result Value Ref Range   Troponin i, poc 0.13 (*) 0.00 - 0.08 ng/mL   Comment NOTIFIED PHYSICIAN     Comment 3            Comment: Due to the release kinetics of cTnI,     a negative result within the first hours     of the onset of symptoms does not rule out     myocardial infarction with certainty.     If myocardial infarction is still suspected,     repeat the test at appropriate intervals.  TROPONIN I     Status: Abnormal   Collection Time    06/26/13 12:42 AM      Result Value Ref Range   Troponin I 0.39 (*) <0.30 ng/mL   Comment:            Due to the release kinetics of cTnI,  a negative result within the first hours     of the onset of symptoms does not rule out     myocardial infarction with certainty.     If myocardial infarction is still suspected,     repeat the test at appropriate intervals.     CRITICAL RESULT CALLED TO, READ BACK BY AND VERIFIED WITH:     Ellison Carwin AT 0115 ON [id number] BY FORSYTH K  PRO B NATRIURETIC PEPTIDE     Status: Abnormal   Collection Time    06/26/13 12:42 AM      Result Value Ref Range   Pro B Natriuretic peptide (BNP) 4422.0 (*) 0 - 450 pg/mL  TROPONIN I     Status: Abnormal   Collection Time    06/26/13  5:39 AM      Result Value Ref Range   Troponin I 2.91 (*) <0.30 ng/mL   Comment:            Due to the release kinetics of cTnI,     a negative result within the first hours     of the onset of symptoms does not rule out      myocardial infarction with certainty.     If myocardial infarction is still suspected,     repeat the test at appropriate intervals.     CRITICAL VALUE NOTED.  VALUE IS CONSISTENT WITH PREVIOUSLY REPORTED AND CALLED VALUE.  CK TOTAL AND CKMB     Status: Abnormal   Collection Time    06/26/13  5:39 AM      Result Value Ref Range   Total CK 318 (*) 7 - 177 U/L   CK, MB 24.8 (*) 0.3 - 4.0 ng/mL   Comment: CRITICAL RESULT CALLED TO, READ BACK BY AND VERIFIED WITH:     L HITT,RN 06/26/13 0643 RHOLMES   Relative Index 7.8 (*) 0.0 - 2.5  PRO B NATRIURETIC PEPTIDE     Status: Abnormal   Collection Time    06/26/13  5:39 AM      Result Value Ref Range   Pro B Natriuretic peptide (BNP) 5499.0 (*) 0 - 450 pg/mL  BASIC METABOLIC PANEL     Status: Abnormal   Collection Time    06/26/13  5:39 AM      Result Value Ref Range   Sodium 132 (*) 137 - 147 mEq/L   Potassium 5.3  3.7 - 5.3 mEq/L   Chloride 94 (*) 96 - 112 mEq/L   CO2 20  19 - 32 mEq/L   Glucose, Bld 264 (*) 70 - 99 mg/dL   BUN 31 (*) 6 - 23 mg/dL   Creatinine, Ser 1.57 (*) 0.50 - 1.10 mg/dL   Calcium 9.3  8.4 - 10.5 mg/dL   GFR calc non Af Amer 30 (*) >90 mL/min   GFR calc Af Amer 35 (*) >90 mL/min   Comment: (NOTE)     The eGFR has been calculated using the CKD EPI equation.     This calculation has not been validated in all clinical situations.     eGFR's persistently <90 mL/min signify possible Chronic Kidney     Disease.   Dg Chest Portable 1 View  06/26/2013   CLINICAL DATA:  Chest pain, cough, shortness of breath.  EXAM: PORTABLE CHEST - 1 VIEW  COMPARISON:  CT ANGIO CHEST W/CM &/OR WO/CM dated 01/23/2013; DG CHEST 1V PORT dated 01/22/2013  FINDINGS: Cardiac enlargement with pulmonary vascular congestion and perihilar  and interstitial edema. Changes have progressed since previous study. Possible small bilateral pleural effusions. No pneumothorax.  IMPRESSION: Cardiac enlargement with pulmonary vascular congestion and diffuse  edema.   Electronically Signed   By: Lucienne Capers M.D.   On: 06/26/2013 00:50    Labs:   Lab Results  Component Value Date   WBC 6.5 06/26/2013   HGB 11.2* 06/26/2013   HCT 33.3* 06/26/2013   MCV 93.0 06/26/2013   PLT 233 06/26/2013    Recent Labs Lab 06/26/13 0029 06/26/13 0539  NA 130* 132*  K 5.1 5.3  CL 93* 94*  CO2 23 20  BUN 32* 31*  CREATININE 1.79* 1.57*  CALCIUM 9.2 9.3  PROT 7.0  --   BILITOT 0.5  --   ALKPHOS 102  --   ALT 13  --   AST 21  --   GLUCOSE 293* 264*   Lab Results  Component Value Date   CKTOTAL 318* 06/26/2013   CKMB 24.8* 06/26/2013   TROPONINI 2.91* 06/26/2013    Lipid Panel     Component Value Date/Time   CHOL 125 12/03/2012 0355   TRIG 42 12/03/2012 0355   HDL 72 12/03/2012 0355   CHOLHDL 1.7 12/03/2012 0355   VLDL 8 12/03/2012 0355   LDLCALC 45 12/03/2012 0355    EKG: Normal sinus rhythm, borderline crit of LVH, ST segment depression in inferior and lateral leads, suggestive of non-ST elevation myocardial infarction. Compared to prior EKG mild worsening of a segment depression.   Assessment/Plan 1. Non-ST elevation myocardial infarction 2. CAD of the native coronary vessels, history of PTCA 3. Diabetes mellitus type 2 uncontrolled 4. Acute on chronic diastolic heart failure 5. Diabetes mellitus with stage 3 chronic kidney disease  Recommendation: Patient needs a repeat coronary angiography. Patient was admitted in October of 2014 with chest pain and coronary angiography at that time included widely patent stents with no significant change in anatomy. Due to her presentation, she needs to proceed with angiography. I have discussed with him regarding less than 1% risk of that, stroke, MI, need for urgent CABG and 2-3% risk of worsening renal failure and need for dialysis. Patient willing and is willing to proceed with coronary angiography and possible angioplasty.  Laverda Page, MD 06/26/2013, 7:31 AM [facility].  [place] Pager: [phone] Office: [phone] If no answer: Cell:  [phone]

## 2013-06-26 NOTE — Progress Notes (Addendum)
Inpatient Diabetes Program Recommendations  AACE/ADA: New Consensus Statement on Inpatient Glycemic Control (2013)  Target Ranges:  Prepandial:   less than 140 mg/dL      Peak postprandial:   less than 180 mg/dL (1-2 hours)      Critically ill patients:  140 - 180 mg/dL   Noted Lantus to begin tonight however pt got no lantus yesterday, thus glucose levels high today. Please order correction per below as well as cbg's q 4 hrs. Inpatient Diabetes Program Recommendations Correction (SSI): Please add moderate correction q 4 hrs while NPO  Once eating, change correction to tidwc and HS scale. (Pt also takes 7 units meal coverage at home-may need some meal coverage as well)  Thank you, Rosita Kea, RN, CNS, Diabetes Coordinator [phone])

## 2013-06-27 LAB — GLUCOSE, CAPILLARY
GLUCOSE-CAPILLARY: 167 mg/dL — AB (ref 70–99)
GLUCOSE-CAPILLARY: 180 mg/dL — AB (ref 70–99)
Glucose-Capillary: 187 mg/dL — ABNORMAL HIGH (ref 70–99)
Glucose-Capillary: 175 mg/dL — ABNORMAL HIGH (ref 70–99)

## 2013-06-27 LAB — HEMOGLOBIN A1C
Hgb A1c MFr Bld: 7.4 % — ABNORMAL HIGH (ref <5.7)
MEAN PLASMA GLUCOSE: 166 mg/dL — AB (ref <117)

## 2013-06-27 LAB — BASIC METABOLIC PANEL
BUN: 36 mg/dL — ABNORMAL HIGH (ref 6–23)
CALCIUM: 8.9 mg/dL (ref 8.4–10.5)
CO2: 23 meq/L (ref 19–32)
Chloride: 94 mEq/L — ABNORMAL LOW (ref 96–112)
Creatinine, Ser: 1.82 mg/dL — ABNORMAL HIGH (ref 0.50–1.10)
GFR calc Af Amer: 29 mL/min — ABNORMAL LOW (ref >90)
GFR calc non Af Amer: 25 mL/min — ABNORMAL LOW (ref >90)
Glucose, Bld: 196 mg/dL — ABNORMAL HIGH (ref 70–99)
Potassium: 5.1 mEq/L (ref 3.7–5.3)
Sodium: 131 mEq/L — ABNORMAL LOW (ref 137–147)

## 2013-06-27 LAB — CBC
HCT: 29.6 % — ABNORMAL LOW (ref 36.0–46.0)
Hemoglobin: 10 g/dL — ABNORMAL LOW (ref 12.0–15.0)
MCH: 31.5 pg (ref 26.0–34.0)
MCHC: 33.8 g/dL (ref 30.0–36.0)
MCV: 93.4 fL (ref 78.0–100.0)
Platelets: 201 10*3/uL (ref 150–400)
RBC: 3.17 MIL/uL — ABNORMAL LOW (ref 3.87–5.11)
RDW: 13 % (ref 11.5–15.5)
WBC: 6.1 10*3/uL (ref 4.0–10.5)

## 2013-06-27 LAB — CK TOTAL AND CKMB (NOT AT ARMC)
CK TOTAL: 311 U/L — AB (ref 7–177)
CK, MB: 11.6 ng/mL — AB (ref 0.3–4.0)
Relative Index: 3.7 — ABNORMAL HIGH (ref 0.0–2.5)

## 2013-06-27 LAB — TROPONIN I: TROPONIN I: 7.84 ng/mL — AB (ref <0.30)

## 2013-06-27 MED ORDER — LIVING WELL WITH DIABETES BOOK
Freq: Once | Status: AC
  Administered 2013-06-27: 04:00:00
  Filled 2013-06-27: qty 1

## 2013-06-27 MED FILL — Sodium Chloride IV Soln 0.9%: INTRAVENOUS | Qty: 50 | Status: AC

## 2013-06-27 NOTE — Progress Notes (Signed)
CARDIAC REHAB PHASE I   PRE:  Rate/Rhythm: 65 SR    BP: sitting 98/38    SaO2:   MODE:  Ambulation: 300 ft   POST:  Rate/Rhythm: 95 SR    BP: sitting 98/35     SaO2:   Pt feeling well today. Used RW, fairly steady. Denied SOB or CP. BP low, stable. Ed reviewed, answering pts diet questions. She is working on managing her diet. Needs more ex/activity. CRPII copay too high in past. 0850- [place], ACSM 06/27/2013 9:52 AM

## 2013-06-27 NOTE — CV Procedure (Signed)
Procedure performed:  Left heart catheterization including hemodynamic monitoring of the left ventricle, LV gram, selective right and left coronary arteriography. PTCA and cutting balloon angioplasty of the in-stent restenotic high-grade ostial circumflex coronary artery with a 3.0 x 6 mm flex tone balloon.  Indication patient is a 80 year-old Caucasian female with history of hypertension (), hyperlipidemia (), Diabetes Mellitus (Uncontrolled)  who presents with Non-ST elevation myocardial infarction. Hence is brought to the cardiac catheterization lab to evaluate his coronary anatomy. Patient has multiple stents in her circumflex coronary artery.  Hemodynamic data:  Left ventricular pressure was 128/8with LVEDP of 16 mm mercury. Aortic pressure was 133/54 with a mean of 87 mm mercury. There was no pressure gradient across the aortic valve.   Left ventricle: Not performed    Right coronary artery: severely diseased diffusely.  Ostial 90% stenosis to 99% stenosis.  The distal RCA fills, previously was occluded in the mid to distal segment.  Diffusely diseased 1.5 mm at most 2 mm vessel throughout with severely diffusely diseased segments evident.  Collaterals from the left coronary artery to the RCA also evident.  Left main coronary artery is large and normal.   Circumflex coronary artery: A large vessel giving origin to a large obtuse marginal 1, OM2.  However the marginals and the distal circumflex coronary artery are diffusely diseased and measure approximately 2 mm to 2.5 mm at most.  The previously placed stents in the circumflex coronary artery revealed mild neointimal hyperplasia in the midsegment, ostial circumflex has a high-grade 99% in-stent restenosis.  The whole proximal and midsegment of the circumflex has previously placed stents within stents.   LAD:  LAD gives origin to several small diagonals with mild diffuse disease. LAD has mild luminal irregularity. No high-grade stenosis was  evident.   Ramus intermediate: It's occluded.  There is a stent in the midsegment.  This is unchanged from prior angiography in October 2014.  Interventional data: Successful PTCA and cutting balloon angioplasty with a 3.0 x 6 mm flexed on cutting balloon.  99% reduced to 0% with brisk TIMI-3 to TIMI-3 flow maintained at the end of the procedure.  Technique: Under sterile precautions using a 6 French right femoral arterial access, a 6 French sheath was introduced into the right femoral artery under fluoroscopy guidance. A 5 [place] multipurpose B2 catheter was advanced into the ascending aorta and then into the left ventricle. Hemodynamics are analysed. Catheter pulled into the ascending aorta and right coronary artery was cannulated, Catheter pulled out of the body over the J-wire, a 5 [place] JL4 catheter was utilized to engage the left ventricle coronary artery and angiography was performed in multiple views.  Catheter exchanged out of the body over J-Wire. NO immediate complications noted. Patient tolerated the procedure well.   Technique of intervention: I discussed the presentation with Dr. Burt Knack as CABG due to recurrent in-stent restenosis was previously discussed with the patient.  It was felt that patient given her advanced age, uncontrolled diabetes, chronic renal failure, severely diffusely diseased vessels, that CABG for OM1 and OM 2 would be high risk for perioperative complications and also possibility of graft failure due to small vessels.  Best option is to proceed with angioplasty.  I discussed these issues with patient who essentially stated that she understood and wants me to proceed.  Using Angiomax for anticoagulation and maintaining ACT at therapeutic range, I utilized a cougar guidewire after engaging the left main coronary artery with a 6 [place] XB 3.5 guide  catheter which was advanced over a J-wire.  I advanced flex tone balloon to the site of the stenosis.  The balloon would not  advance, I gently advanced to the inflow of the circumflex coronary artery and and depressed was performed at 6 atmospheric pressure for 45 seconds and then I was able to advance this into the main in-stent restenotic segment and multiple inflations were performed from 60 seconds to 90 seconds at peak of 12 atmospheric pressure 3, intra-coronary nitroglycerin was witnessed or, 200 g and angiography was performed.  Excellent result was evident.  Guidewire was withdrawn and the catheter disengaged and pulled out of the body.  Patient tolerated the procedure well. A total of 60 cc of contrast was utilized for diagnostic and interventional procedure.

## 2013-06-27 NOTE — Progress Notes (Signed)
Subjective:  No further chest pain  Objective:  Vital Signs in the last 24 hours: Temp:  [97.5 F (36.4 C)-98.8 F (37.1 C)] 98 F (36.7 C) (04/23 0742) Pulse Rate:  [67-84] 71 (04/23 0742) Resp:  [18] 18 (04/23 0742) BP: (99-162)/(27-97) 113/45 mmHg (04/23 0742) SpO2:  [84 %-97 %] 94 % (04/23 0742) Weight:  [86.3 kg (190 lb 4.1 oz)-86.6 kg (190 lb 14.7 oz)] 86.6 kg (190 lb 14.7 oz) (04/23 0459)  Intake/Output from previous day: 04/22 0701 - 04/23 0700 In: 1522 [P.O.:640; I.V.:882] Out: 1400 [Urine:1400]  Physical Exam:  General appearance: alert, cooperative, no distress and mildly obese  Eyes: negative findings: lids and lashes normal  Neck: no adenopathy, no carotid bruit, no JVD, supple, symmetrical, trachea midline and thyroid not enlarged, symmetric, no tenderness/mass/nodules  Neck: JVP - normal, carotids 2+= without bruits  Resp: Fine crackles heard at the bases bilaterally  Chest wall: no tenderness  Cardio: regular rate and rhythm, S1, S2 normal, no murmur, click, rub or gallop  GI: soft, non-tender; bowel sounds normal; no masses, no organomegaly  Extremities: extremities normal,  without any hematoma or tenderness or signs of inflammation, no cyanosis.  Trace leg edema evident.  Pulses: Absent right radial pulse otherwise 2+ and symmetric  Lab Results: BMP  Recent Labs  01/23/13 0956 06/26/13 0029 06/26/13 0539  NA 140 130* 132*  K 4.1 5.1 5.3  CL 103 93* 94*  CO2 25 23 20   GLUCOSE 101* 293* 264*  BUN 24* 32* 31*  CREATININE 1.24* 1.79* 1.57*  CALCIUM 9.3 9.2 9.3  GFRNONAA 41* 26* 30*  GFRAA 47* 30* 35*    CBC  Recent Labs Lab 06/26/13 0029 06/27/13 0527  WBC 6.5 6.1  RBC 3.58* 3.17*  HGB 11.2* 10.0*  HCT 33.3* 29.6*  PLT 233 201  MCV 93.0 93.4  MCH 31.3 31.5  MCHC 33.6 33.8  RDW 12.8 13.0  LYMPHSABS 1.1  --   MONOABS 0.6  --   EOSABS 0.1  --   BASOSABS 0.0  --     HEMOGLOBIN A1C Lab Results  Component Value Date   HGBA1C 7.7*  06/26/2013   MPG 174* 06/26/2013    Cardiac Panel (last 3 results)  Recent Labs  06/26/13 0539 06/26/13 1520 06/27/13 0523  CKTOTAL 318* 561* 311*  CKMB 24.8* 37.1* 11.6*  TROPONINI 2.91* 14.02* 7.84*  RELINDX 7.8* 6.6* 3.7*    BNP (last 3 results)  Recent Labs  01/22/13 1025 06/26/13 0042 06/26/13 0539  PROBNP 7728.0* 4422.0* 5499.0*    TSH  Recent Labs  12/02/12 1442  TSH 0.596    CHOLESTEROL  Recent Labs  07/02/12 1016 12/03/12 0355  CHOL 143 125    Hepatic Function Panel  Recent Labs  09/03/12 0844 12/02/12 0355 06/26/13 0029  PROT 7.0 7.0 7.0  ALBUMIN 3.5 3.8 3.8  AST 56* 18 21  ALT 19 11 13   ALKPHOS 127* 124* 102  BILITOT 0.8 0.5 0.5    Cardiac Studies:  EKG: NSR, LVH with repolarization abnormality cannot r/o lateral ischemia. ST depression in inferior and lateral leads improved from yesterday.   Assessment/Plan:  1. NSTEMI 2. DM-2 uncontrolled 3. Chronic renal failure. 4. Acute on chronic diastolic HF, secondary to #1.  Rec: Observe one more day. D/C tomorrow. CHF resolved clinically.   Laverda Page, M.D. 06/27/2013, 7:45 AM [facility] Pager: [phone] Office: [phone] If no answer: [phone]

## 2013-06-27 NOTE — Progress Notes (Signed)
  Echocardiogram 2D Echocardiogram has been performed.  [place] 06/27/2013, 3:01 PM

## 2013-06-28 LAB — CBC
HCT: 29.2 % — ABNORMAL LOW (ref 36.0–46.0)
Hemoglobin: 10 g/dL — ABNORMAL LOW (ref 12.0–15.0)
MCH: 31.7 pg (ref 26.0–34.0)
MCHC: 34.2 g/dL (ref 30.0–36.0)
MCV: 92.7 fL (ref 78.0–100.0)
PLATELETS: 176 10*3/uL (ref 150–400)
RBC: 3.15 MIL/uL — ABNORMAL LOW (ref 3.87–5.11)
RDW: 13 % (ref 11.5–15.5)
WBC: 5.4 10*3/uL (ref 4.0–10.5)

## 2013-06-28 LAB — BASIC METABOLIC PANEL
BUN: 31 mg/dL — AB (ref 6–23)
CHLORIDE: 96 meq/L (ref 96–112)
CO2: 23 meq/L (ref 19–32)
Calcium: 9 mg/dL (ref 8.4–10.5)
Creatinine, Ser: 1.5 mg/dL — ABNORMAL HIGH (ref 0.50–1.10)
GFR calc Af Amer: 37 mL/min — ABNORMAL LOW (ref >90)
GFR calc non Af Amer: 32 mL/min — ABNORMAL LOW (ref >90)
Glucose, Bld: 169 mg/dL — ABNORMAL HIGH (ref 70–99)
Potassium: 4.6 mEq/L (ref 3.7–5.3)
Sodium: 131 mEq/L — ABNORMAL LOW (ref 137–147)

## 2013-06-28 LAB — GLUCOSE, CAPILLARY
Glucose-Capillary: 143 mg/dL — ABNORMAL HIGH (ref 70–99)
Glucose-Capillary: 191 mg/dL — ABNORMAL HIGH (ref 70–99)

## 2013-06-28 MED ORDER — VORAPAXAR SULFATE 2.08 MG PO TABS: 1.0000 | ORAL_TABLET | Freq: Every day | ORAL | Status: DC

## 2013-06-28 MED ORDER — CLOPIDOGREL BISULFATE 75 MG PO TABS: 75.0000 mg | ORAL_TABLET | Freq: Every day | ORAL | Status: DC

## 2013-06-28 NOTE — Progress Notes (Signed)
CARDIAC REHAB PHASE I   PRE:  Rate/Rhythm: 80 SR  BP:  Supine:   Sitting: 132/43  Standing:    SaO2:   MODE:  Ambulation: 500 ft   POST:  Rate/Rhythm: 31mSR  BP:  Supine:   Sitting: 134/36  Standing:    SaO2:  4536-4680 Pt walked 500 ft with rolling walker with steady gait. Tolerated well. Has rolling walker and cane at home for use. Pt stated she is motivated to make some dietary changes. To recliner after walk. No CP.   Graylon Good, RN BSN  06/28/2013 8:28 AM

## 2013-06-28 NOTE — Discharge Summary (Signed)
Physician Discharge Summary  Patient ID: Claudia [name] MRN: [id number] DOB/AGE: 10/17/1931 80 y.o.    Admit date: 06/26/2013 Discharge date: 06/28/2013  Primary Discharge Diagnosis NSTEMI: Lateral wall. CAD of native coronary vessels.  Secondary Discharge Diagnosis 1. Hyperlipidemia- mixed. 2. DM-2 uncontrolled  3. Chronic renal failure.  4. Acute on chronic diastolic HF, secondary to #1.  Significant Diagnostic Studies: 06/26/2013: Left heart catheterization including hemodynamic monitoring of the left ventricle, LV gram, selective right and left coronary arteriography. PTCA and cutting balloon angioplasty of the in-stent restenotic high-grade ostial circumflex coronary artery with a 3.0 x 6 mm flex tone balloon.   Hemodynamic data:  Left ventricular pressure was 128/8with LVEDP of 16 mm mercury. Aortic pressure was 133/54 with a mean of 87 mm mercury. There was no pressure gradient across the aortic valve.   Left ventricle: Not performed   Right coronary artery: severely diseased diffusely. Ostial 90% stenosis to 99% stenosis. The distal RCA fills, previously was occluded in the mid to distal segment. Diffusely diseased 1.5 mm at most 2 mm vessel throughout with severely diffusely diseased segments evident. Collaterals from the left coronary artery to the RCA also evident.   Left main coronary artery is large and normal.   Circumflex coronary artery: A large vessel giving origin to a large obtuse marginal 1, OM2. However the marginals and the distal circumflex coronary artery are diffusely diseased and measure approximately 2 mm to 2.5 mm at most. The previously placed stents in the circumflex coronary artery revealed mild neointimal hyperplasia in the midsegment, ostial circumflex has a high-grade 99% in-stent restenosis. The whole proximal and midsegment of the circumflex has previously placed stents within stents.   LAD: LAD gives origin to several small diagonals with mild  diffuse disease. LAD has mild luminal irregularity. No high-grade stenosis was evident.   Ramus intermediate: It's occluded. There is a stent in the midsegment. This is unchanged from prior angiography in October 2014.   Interventional data: Successful PTCA and cutting balloon angioplasty with a 3.0 x 6 mm flexed on cutting balloon. 99% reduced to 0% with brisk TIMI-3 to TIMI-3 flow maintained at the end of the procedure.  Echocardiogram 06/27/2013: Mild decrease in LV systolic function, EF 64%, inferior and lateral hypokinesis.  Mild to moderate mitral regurgitation, mild aortic regurgitation, mild TR and mild to moderate pulmonary hypertension.  Trace pre-cardial effusion.  Hospital Course:  Patient presented to the emergency room with ongoing chest pain, diaphoresis, marked EKG abnormalities suggesting recurrence of non-ST elevation myocardial infarction.  She stabilized on medical therapy, and early next day morning she underwent coronary angiography and after discussion with colleagues, it is felt that CABG would be high risk in a patient with multiple medical comorbidities for diffusely diseased circumflex coronary artery.  Hence we proceeded with angioplasty, of the culprit lesion.  She had good results and I kept her an additional day due to chronic renal failure and to make sure that she would be stable without recurrence of angina pectoris.  Patient did well, she had no complication postprocedure, hence felt stable for discharge.   Recommendations on discharge: I have discontinued Effient, started the patient on clopidogrel 75 mg by mouth daily, I have also started her on Zontivity to see whether she would have better outcomes with regard to recurrent in-stent restenosis.  Patient behaving extremely aggressively due to uncontrolled diabetes and ongoing cardiovascular risk factors in spite of sandwich stenting with drug-eluting stents.  Hopefully she will remain stable.  I will see her back in  the office in 10 days to 2 weeks.  Discharge Exam: Blood pressure 132/43, pulse 71, temperature 98.2 F (36.8 C), temperature source Oral, resp. rate 18, height _0  (1.702 m), weight 86.7 kg (191 lb 2.2 oz), SpO2 97.00%.  General appearance: alert, cooperative, no distress and mildly obese  Eyes: negative findings: lids and lashes normal  Neck: no adenopathy, no carotid bruit, no JVD, supple, symmetrical, trachea midline and thyroid not enlarged, symmetric, no tenderness/mass/nodules  Neck: JVP - normal, carotids 2+= without bruits  Resp: clear to auscultate.  Chest wall: no tenderness  Cardio: regular rate and rhythm, S1, S2 normal, no murmur, click, rub or gallop  GI: soft, non-tender; bowel sounds normal; no masses, no organomegaly  Extremity: No edema,  Right groin site without any hematoma.  Labs:   Lab Results  Component Value Date   WBC 5.4 06/28/2013   HGB 10.0* 06/28/2013   HCT 29.2* 06/28/2013   MCV 92.7 06/28/2013   PLT 176 06/28/2013    Recent Labs Lab 06/26/13 0029  06/28/13 0945  NA 130*  < > 131*  K 5.1  < > 4.6  CL 93*  < > 96  CO2 23  < > 23  BUN 32*  < > 31*  CREATININE 1.79*  < > 1.50*  CALCIUM 9.2  < > 9.0  PROT 7.0  --   --   BILITOT 0.5  --   --   ALKPHOS 102  --   --   ALT 13  --   --   AST 21  --   --   GLUCOSE 293*  < > 169*  < > = values in this interval not displayed. Lab Results  Component Value Date   CKTOTAL 311* 06/27/2013   CKMB 11.6* 06/27/2013   TROPONINI 7.84* 06/27/2013    Lipid Panel     Component Value Date/Time   CHOL 125 12/03/2012 0355   TRIG 42 12/03/2012 0355   HDL 72 12/03/2012 0355   CHOLHDL 1.7 12/03/2012 0355   VLDL 8 12/03/2012 0355   LDLCALC 45 12/03/2012 0355    EKG: 06/27/2013: NSR, LVH with repolarization abnormality cannot r/o lateral ischemia. ST depression in inferior and lateral leads improved from 06/26/2013.     Radiology: Dg Chest Portable 1 View  06/26/2013   CLINICAL DATA:  Chest pain, cough, shortness  of breath.  EXAM: PORTABLE CHEST - 1 VIEW  COMPARISON:  CT ANGIO CHEST W/CM &/OR WO/CM dated 01/23/2013; DG CHEST 1V PORT dated 01/22/2013  FINDINGS: Cardiac enlargement with pulmonary vascular congestion and perihilar and interstitial edema. Changes have progressed since previous study. Possible small bilateral pleural effusions. No pneumothorax.  IMPRESSION: Cardiac enlargement with pulmonary vascular congestion and diffuse edema.   Electronically Signed   By: Lucienne Capers M.D.   On: 06/26/2013 00:50      FOLLOW UP PLANS AND APPOINTMENTS  Future Appointments Provider Department Dept Phone   03/31/2014 1:45 PM Hayden Pedro, MD [place] [phone]       Medication List    STOP taking these medications       prasugrel 10 MG Tabs tablet  Commonly known as:  EFFIENT      TAKE these medications       ACCU-CHEK NANO SMARTVIEW W/DEVICE Kit  Pt needs monitor, strips (box of 400/ 4 refill), lancets (box of 400/4 refills) She checks her BS qid (before meals &  QHS) Dx: 250.00     aspirin EC 81 MG tablet  Take 81 mg by mouth daily.     B-D ULTRAFINE III SHORT PEN 31G X 8 MM Misc  Generic drug:  Insulin Pen Needle  USE AS DIRECTED WITH LANTUS PEN     carvedilol 6.25 MG tablet  Commonly known as:  COREG  Take 6.25 mg by mouth 2 (two) times daily with a meal.     clopidogrel 75 MG tablet  Commonly known as:  PLAVIX  Take 1 tablet (75 mg total) by mouth daily with breakfast.     furosemide 40 MG tablet  Commonly known as:  LASIX  Take 20 mg by mouth daily as needed for fluid.     isosorbide mononitrate 60 MG 24 hr tablet  Commonly known as:  IMDUR  Take 1 tablet (60 mg total) by mouth daily.     LANTUS SOLOSTAR 100 UNIT/ML injection  Generic drug:  insulin glargine  Inject 20 Units into the skin at bedtime.     losartan 50 MG tablet  Commonly known as:  COZAAR  Take 0.5 tablets (25 mg total) by mouth daily.     nitroGLYCERIN 0.4 MG SL tablet   Commonly known as:  NITROSTAT  Place 0.4 mg under the tongue every 5 (five) minutes as needed for chest pain.     NOVOLOG FLEXPEN 100 UNIT/ML FlexPen  Generic drug:  insulin aspart  Inject 7-10 Units into the skin 3 (three) times daily with meals. Inject 7 units in the morning 10 units in the afternoon and 10 units at bedtime     omeprazole 20 MG capsule  Commonly known as:  PRILOSEC  Take 20 mg by mouth 2 (two) times daily.     ranolazine 500 MG 12 hr tablet  Commonly known as:  RANEXA  Take 1 tablet (500 mg total) by mouth 2 (two) times daily.     simvastatin 20 MG tablet  Commonly known as:  ZOCOR  Take 1 tablet (20 mg total) by mouth every evening.     Vorapaxar Sulfate 2.08 MG Tabs  Take 1 tablet by mouth daily.           Follow-up Information   Follow up with Laverda Page, MD On 07/03/2013. (1:30 PM. Come to office to pick up Zontivity tablets today.)    Specialty:  Cardiology   Contact information:   [address] [place] [address] [phone]        Laverda Page, MD 06/28/2013, 5:58 PM  Pager: [phone] Office: [phone] If no answer: [phone]

## 2013-06-28 NOTE — Progress Notes (Signed)
Came to visit patient at bedside as she is active with [facility]. Made her aware that [facility] will continue to follow her post hospital discharge. Reports her [place] "helps me a lot". Discussed the potential need for home health services at discharge as well. She states "I do not think I need home health right now because I am always on the go". Made inpatient RN case manager aware of bedside visit. Left contact information at bedside for patient. Appreciative of visit.  Marthenia Rolling, MSN- [facility] Liaison [phone]

## 2013-07-18 ENCOUNTER — Encounter: Payer: Self-pay | Admitting: Family Medicine

## 2013-07-18 ENCOUNTER — Ambulatory Visit (INDEPENDENT_AMBULATORY_CARE_PROVIDER_SITE_OTHER): Payer: Medicare HMO | Admitting: Family Medicine

## 2013-07-18 VITALS — BP 100/58 | HR 68 | Temp 96.7°F | Resp 18 | Ht 67.0 in | Wt 194.0 lb

## 2013-07-18 DIAGNOSIS — IMO0001 Reserved for inherently not codable concepts without codable children: Secondary | ICD-10-CM

## 2013-07-18 DIAGNOSIS — E1165 Type 2 diabetes mellitus with hyperglycemia: Principal | ICD-10-CM

## 2013-07-18 NOTE — Patient Instructions (Signed)
Keep taking lantus 20 units at night.  I want you to decrease novolog to 5 units per meal (unless sugar is less than 100, then no novolog qith meal just eat) plus a correction factor  Add novolog based no your pre meal sugar <150-0 151-200-2 units 201-250-4 units, 251-300-6 units  301-350- 8 units  Add novolog with snacks based no grams of carbs in what she is eating- 1 unit for every 15 grams of carbs you eat  The preset 5 units is an assumption that may need to change based on your meal trends and if you have lows (<80)

## 2013-07-18 NOTE — Progress Notes (Signed)
Subjective:    Patient ID: Claudia [name], female    DOB: September 07, 1931, 80 y.o.   MRN: [id number]  HPI Patient is here today for follow up of her diabetes mellitus type 2. She tried taking Lantus 20 units subcutaneous daily in addition to 10 units of NovoLog with breakfast and 7 units of NovoLog with lunch and dinner.  Her hemoglobin A1c was 7 point recently in the hospital. Her cardiologist is asking that we try to control her diabetes more closely. Past Medical History  Diagnosis Date  . GERD (gastroesophageal reflux disease)   . PUD (peptic ulcer disease)   . Anemia   . Hypertension   . S/P endoscopy Aug 2011    3 superficial gastric ulcers, NSAID-induced  . S/P colonoscopy Sept 2011    left-sided diverticula, tubular adenoma  . Coronary artery disease   . Shortness of breath   . Diabetes mellitus     insulin dependent  . Headache(784.0)     rare migraines  . Cancer     hx of skin cancer  . Arthritis   . Osteopenia   . Hypercholesterolemia   . SIADH (syndrome of inappropriate ADH production)   . CHF (congestive heart failure)   . Myocardial infarction 2013   Current Outpatient Prescriptions on File Prior to Visit  Medication Sig Dispense Refill  . aspirin EC 81 MG tablet Take 81 mg by mouth daily.      . B-D ULTRAFINE III SHORT PEN 31G X 8 MM MISC USE AS DIRECTED WITH LANTUS PEN  100 each  5  . Blood Glucose Monitoring Suppl (ACCU-CHEK NANO SMARTVIEW) W/DEVICE KIT Pt needs monitor, strips (box of 400/ 4 refill), lancets (box of 400/4 refills) She checks her BS qid (before meals & QHS) Dx: 250.00  1 kit  0  . carvedilol (COREG) 6.25 MG tablet Take 6.25 mg by mouth 2 (two) times daily with a meal.       . clopidogrel (PLAVIX) 75 MG tablet Take 1 tablet (75 mg total) by mouth daily with breakfast.  30 tablet  6  . furosemide (LASIX) 40 MG tablet Take 20 mg by mouth daily as needed for fluid.      Marland Kitchen insulin aspart (NOVOLOG FLEXPEN) 100 UNIT/ML FlexPen Inject 7-10 Units into the  skin 3 (three) times daily with meals. Inject 7 units in the morning 10 units in the afternoon and 10 units at bedtime      . insulin glargine (LANTUS SOLOSTAR) 100 UNIT/ML injection Inject 20 Units into the skin at bedtime.      . isosorbide mononitrate (IMDUR) 60 MG 24 hr tablet Take 1 tablet (60 mg total) by mouth daily.  30 tablet  3  . losartan (COZAAR) 50 MG tablet Take 0.5 tablets (25 mg total) by mouth daily.  30 tablet  11  . nitroGLYCERIN (NITROSTAT) 0.4 MG SL tablet Place 0.4 mg under the tongue every 5 (five) minutes as needed for chest pain.      Marland Kitchen omeprazole (PRILOSEC) 20 MG capsule Take 20 mg by mouth 2 (two) times daily.       . ranolazine (RANEXA) 500 MG 12 hr tablet Take 1 tablet (500 mg total) by mouth 2 (two) times daily.  60 tablet  1  . simvastatin (ZOCOR) 20 MG tablet Take 1 tablet (20 mg total) by mouth every evening.  90 tablet  1  . Vorapaxar Sulfate 2.08 MG TABS Take 1 tablet by mouth daily.  [place]  tablet  1   No current facility-administered medications on file prior to visit.   Allergies  Allergen Reactions  . Aspirin Other (See Comments)    Intolerant to high doses   History   Social History  . Marital Status: Widowed    Spouse Name: N/A    Number of Children: N/A  . Years of Education: N/A   Occupational History  . Not on file.   Social History Main Topics  . Smoking status: Never Smoker   . Smokeless tobacco: Never Used  . Alcohol Use: No  . Drug Use: No  . Sexual Activity: No   Other Topics Concern  . Not on file   Social History Narrative  . No narrative on file      Review of Systems  All other systems reviewed and are negative.      Objective:   Physical Exam  Vitals reviewed. Constitutional: She appears well-developed and well-nourished.  Cardiovascular: Normal rate, regular rhythm and normal heart sounds.   Pulmonary/Chest: Effort normal and breath sounds normal. No respiratory distress. She has no wheezes. She has no rales. She  exhibits no tenderness.          Assessment & Plan:  1. Type II or unspecified type diabetes mellitus without mention of complication, uncontrolled Keep taking lantus 20 units at night.  I want you to decrease novolog to 5 units per meal (unless sugar is less than 100, then no novolog with meal just eat) plus a correction factor  Add novolog based on your pre meal sugar <150-0 151-200-2 units 201-250-4 units, 251-300-6 units  301-350- 8 units  Add novolog with snacks based no grams of carbs in what she is eating- 1 unit for every 15 grams of carbs you eat  The preset 5 units is an assumption that may need to change based on your meal trends and if you have lows (<80)  Recheck in 2 weeks. Will further titrate her insulin at that time

## 2013-07-20 ENCOUNTER — Other Ambulatory Visit: Payer: Self-pay | Admitting: Gastroenterology

## 2013-07-23 ENCOUNTER — Other Ambulatory Visit: Payer: Self-pay | Admitting: Gastroenterology

## 2013-07-24 MED ORDER — PANTOPRAZOLE SODIUM 40 MG PO TBEC: 40.0000 mg | DELAYED_RELEASE_TABLET | Freq: Two times a day (BID) | ORAL | Status: DC

## 2013-07-24 NOTE — Telephone Encounter (Signed)
I've changed her to Protonix BID instead of Prilosec BID due to taking Plavix.

## 2013-08-01 ENCOUNTER — Ambulatory Visit: Payer: Medicare HMO | Admitting: Family Medicine

## 2013-08-02 ENCOUNTER — Ambulatory Visit: Payer: Medicare HMO | Admitting: Family Medicine

## 2013-08-05 ENCOUNTER — Ambulatory Visit (INDEPENDENT_AMBULATORY_CARE_PROVIDER_SITE_OTHER): Payer: Medicare HMO | Admitting: Family Medicine

## 2013-08-05 ENCOUNTER — Encounter: Payer: Self-pay | Admitting: Family Medicine

## 2013-08-05 VITALS — BP 128/48 | HR 76 | Temp 97.1°F | Resp 18 | Ht 67.0 in | Wt 188.0 lb

## 2013-08-05 DIAGNOSIS — IMO0001 Reserved for inherently not codable concepts without codable children: Secondary | ICD-10-CM

## 2013-08-05 DIAGNOSIS — E1165 Type 2 diabetes mellitus with hyperglycemia: Principal | ICD-10-CM

## 2013-08-05 NOTE — Progress Notes (Signed)
Subjective:    Patient ID: Claudia [name], female    DOB: 04-04-1931, 80 y.o.   MRN: [id number]  HPI 07/18/13 Patient is here today for follow up of her diabetes mellitus type 2. She tried taking Lantus 20 units subcutaneous daily in addition to 10 units of NovoLog with breakfast and 7 units of NovoLog with lunch and dinner.  Her hemoglobin A1c was 7 point recently in the hospital. Her cardiologist is asking that we try to control her diabetes more closely. At that time, my plan was: 1. Type II or unspecified type diabetes mellitus without mention of complication, uncontrolled Keep taking lantus 20 units at night. I want you to decrease novolog to 5 units per meal (unless sugar is less than 100, then no novolog with meal just eat) plus a correction factor Add novolog based on your pre meal sugar <150-0 151-200-2 units 201-250-4 units, 251-300-6 units  301-350- 8 units Add novolog with snacks based on grams of carbs in what you are eating- 1 unit for every 15 grams of carbs you eat The preset 5 units is an assumption that may need to change based on your meal trends and if you have lows (<80) Recheck in 2 weeks. Will further titrate her insulin at that time   08/05/13 Patient is here today for recheck.  Her fasting blood sugar is running 51-128. The majority of the blood sugars are 80-120.  The mornings that she is having hypoglycemic episodes, the night before her she is usually under 150. She has not been taking a snack with her evening dose of Lantus.  She tends to run 120-200 lunch. This is the time she has highest blood sugars. She is typically less than 160 around suppertime. She typically less than 160 before bed. Past Medical History  Diagnosis Date  . GERD (gastroesophageal reflux disease)   . PUD (peptic ulcer disease)   . Anemia   . Hypertension   . S/P endoscopy Aug 2011    3 superficial gastric ulcers, NSAID-induced  . S/P colonoscopy Sept 2011    left-sided diverticula,  tubular adenoma  . Coronary artery disease   . Shortness of breath   . Diabetes mellitus     insulin dependent  . Headache(784.0)     rare migraines  . Cancer     hx of skin cancer  . Arthritis   . Osteopenia   . Hypercholesterolemia   . SIADH (syndrome of inappropriate ADH production)   . CHF (congestive heart failure)   . Myocardial infarction 2013   Current Outpatient Prescriptions on File Prior to Visit  Medication Sig Dispense Refill  . aspirin EC 81 MG tablet Take 81 mg by mouth daily.      . B-D ULTRAFINE III SHORT PEN 31G X 8 MM MISC USE AS DIRECTED WITH LANTUS PEN  100 each  5  . Blood Glucose Monitoring Suppl (ACCU-CHEK NANO SMARTVIEW) W/DEVICE KIT Pt needs monitor, strips (box of 400/ 4 refill), lancets (box of 400/4 refills) She checks her BS qid (before meals & QHS) Dx: 250.00  1 kit  0  . carvedilol (COREG) 6.25 MG tablet Take 6.25 mg by mouth 2 (two) times daily with a meal.       . clopidogrel (PLAVIX) 75 MG tablet Take 1 tablet (75 mg total) by mouth daily with breakfast.  30 tablet  6  . furosemide (LASIX) 40 MG tablet Take 20 mg by mouth daily as needed for fluid.      Marland Kitchen  insulin aspart (NOVOLOG FLEXPEN) 100 UNIT/ML FlexPen Inject 7-10 Units into the skin 3 (three) times daily with meals. Inject 7 units in the morning 10 units in the afternoon and 10 units at bedtime      . insulin glargine (LANTUS SOLOSTAR) 100 UNIT/ML injection Inject 20 Units into the skin at bedtime.      . isosorbide mononitrate (IMDUR) 60 MG 24 hr tablet Take 1 tablet (60 mg total) by mouth daily.  30 tablet  3  . losartan (COZAAR) 50 MG tablet Take 0.5 tablets (25 mg total) by mouth daily.  30 tablet  11  . nitroGLYCERIN (NITROSTAT) 0.4 MG SL tablet Place 0.4 mg under the tongue every 5 (five) minutes as needed for chest pain.      Marland Kitchen omeprazole (PRILOSEC) 20 MG capsule Take 20 mg by mouth 2 (two) times daily.       . pantoprazole (PROTONIX) 40 MG tablet Take 1 tablet (40 mg total) by mouth 2  (two) times daily before a meal.  60 tablet  3  . ranolazine (RANEXA) 500 MG 12 hr tablet Take 1 tablet (500 mg total) by mouth 2 (two) times daily.  60 tablet  1  . simvastatin (ZOCOR) 20 MG tablet Take 1 tablet (20 mg total) by mouth every evening.  90 tablet  1  . Vorapaxar Sulfate 2.08 MG TABS Take 1 tablet by mouth daily.  30 tablet  1   No current facility-administered medications on file prior to visit.   Allergies  Allergen Reactions  . Aspirin Other (See Comments)    Intolerant to high doses   History   Social History  . Marital Status: Widowed    Spouse Name: N/A    Number of Children: N/A  . Years of Education: N/A   Occupational History  . Not on file.   Social History Main Topics  . Smoking status: Never Smoker   . Smokeless tobacco: Never Used  . Alcohol Use: No  . Drug Use: No  . Sexual Activity: No   Other Topics Concern  . Not on file   Social History Narrative  . No narrative on file      Review of Systems  All other systems reviewed and are negative.      Objective:   Physical Exam  Vitals reviewed. Constitutional: She appears well-developed and well-nourished.  Cardiovascular: Normal rate, regular rhythm and normal heart sounds.   Pulmonary/Chest: Effort normal and breath sounds normal. No respiratory distress. She has no wheezes. She has no rales. She exhibits no tenderness.          Assessment & Plan:  1. Type II or unspecified type diabetes mellitus without mention of complication, uncontrolled Overall sugars look excellent. She tends to have her highest blood sugars prior to lunch. Therefore I have recommended she increase her NovoLog to 7 units with breakfast plus a correction factor. I want her to continue 5 units plus a correction factor at lunch and 5 units plus a correction factor at dinner. I want her to continue Lantus 20 units subcutaneous each bedtime. I suggested she take a snack when she takes her Lantus at night to prevent  hypoglycemia in the morning.  Recheck hemoglobin A1c in 3 months.

## 2013-08-14 ENCOUNTER — Other Ambulatory Visit: Payer: Self-pay | Admitting: Family Medicine

## 2013-08-14 NOTE — Telephone Encounter (Signed)
Refill appropriate and filled per protocol. 

## 2013-08-19 ENCOUNTER — Telehealth: Payer: Self-pay | Admitting: Family Medicine

## 2013-08-19 NOTE — Telephone Encounter (Signed)
Decrease coreg to 1/2 tablet BID, , see if [facility] can return for BP check later this week and call us with values

## 2013-08-19 NOTE — Telephone Encounter (Signed)
[facility] called and states that pt's bp is dropping low - Her BP today was 94/47, 80/36 x 2, 92/44. Pt feels fine as long as she is sitting but when she stands up she gets lightheaded. What should she do?

## 2013-08-20 NOTE — Telephone Encounter (Signed)
Pt aware to cut coreg in half and will keep a check on bp and call back on Friday with values

## 2013-08-23 ENCOUNTER — Telehealth: Payer: Self-pay | Admitting: Family Medicine

## 2013-08-23 NOTE — Telephone Encounter (Signed)
Message copied by Alyson Locket on Fri Aug 23, 2013  2:51 PM ------      Message from: Lenore Manner      Created: Fri Aug 23, 2013 10:53 AM      Regarding: BP      Contact: [phone]       Claudia [name] has called and updated [name] on her BP and the high has ranged from 125-137 and the low has ranged 47-57 ------

## 2013-08-26 NOTE — Telephone Encounter (Signed)
Those are good readings for her blood pressure.  How are her sugars?

## 2013-08-28 NOTE — Telephone Encounter (Signed)
Pt states that her BS have been 105-120 and 140's after eating. She had one BS that was 251 but she had forgotten to take her shot but otherwise it is doing ok.

## 2013-08-29 NOTE — Telephone Encounter (Signed)
Those are excellent.  No changes.

## 2013-10-01 ENCOUNTER — Telehealth: Payer: Self-pay | Admitting: *Deleted

## 2013-10-01 NOTE — Telephone Encounter (Signed)
Submitted [employer] referral thru acuity connect for authorization was approved with auth number [id number] per [facility] office pt has appt on July 31 and needed [place] referral. Once received form form [employer] will fax to [place] office.

## 2013-11-05 ENCOUNTER — Inpatient Hospital Stay (HOSPITAL_COMMUNITY)
Admission: EM | Admit: 2013-11-05 | Discharge: 2013-12-13 | DRG: 216 | Disposition: A | Discharge status: Skilled Nursing Facility | Payer: Medicare HMO | Attending: Cardiothoracic Surgery | Admitting: Cardiothoracic Surgery

## 2013-11-05 ENCOUNTER — Other Ambulatory Visit: Payer: Self-pay

## 2013-11-05 ENCOUNTER — Encounter (HOSPITAL_COMMUNITY): Payer: Self-pay | Admitting: Emergency Medicine

## 2013-11-05 ENCOUNTER — Ambulatory Visit (INDEPENDENT_AMBULATORY_CARE_PROVIDER_SITE_OTHER): Payer: Medicare HMO | Admitting: Family Medicine

## 2013-11-05 ENCOUNTER — Encounter (HOSPITAL_COMMUNITY)
Admission: EM | Disposition: A | Discharge status: Skilled Nursing Facility | Payer: Self-pay | Source: Home / Self Care | Attending: Cardiothoracic Surgery

## 2013-11-05 ENCOUNTER — Emergency Department (HOSPITAL_COMMUNITY): Payer: Medicare HMO

## 2013-11-05 ENCOUNTER — Encounter: Payer: Self-pay | Admitting: Family Medicine

## 2013-11-05 VITALS — BP 136/64 | HR 74 | Temp 97.7°F | Resp 16 | Ht 67.0 in | Wt 196.0 lb

## 2013-11-05 DIAGNOSIS — Z79899 Other long term (current) drug therapy: Secondary | ICD-10-CM

## 2013-11-05 DIAGNOSIS — R791 Abnormal coagulation profile: Secondary | ICD-10-CM | POA: Diagnosis not present

## 2013-11-05 DIAGNOSIS — I200 Unstable angina: Secondary | ICD-10-CM | POA: Diagnosis present

## 2013-11-05 DIAGNOSIS — J90 Pleural effusion, not elsewhere classified: Secondary | ICD-10-CM | POA: Diagnosis not present

## 2013-11-05 DIAGNOSIS — Z794 Long term (current) use of insulin: Secondary | ICD-10-CM

## 2013-11-05 DIAGNOSIS — E871 Hypo-osmolality and hyponatremia: Secondary | ICD-10-CM | POA: Diagnosis present

## 2013-11-05 DIAGNOSIS — Z7982 Long term (current) use of aspirin: Secondary | ICD-10-CM

## 2013-11-05 DIAGNOSIS — E873 Alkalosis: Secondary | ICD-10-CM | POA: Diagnosis not present

## 2013-11-05 DIAGNOSIS — N184 Chronic kidney disease, stage 4 (severe): Secondary | ICD-10-CM | POA: Diagnosis present

## 2013-11-05 DIAGNOSIS — D6959 Other secondary thrombocytopenia: Secondary | ICD-10-CM | POA: Diagnosis not present

## 2013-11-05 DIAGNOSIS — N179 Acute kidney failure, unspecified: Secondary | ICD-10-CM

## 2013-11-05 DIAGNOSIS — I5033 Acute on chronic diastolic (congestive) heart failure: Secondary | ICD-10-CM | POA: Diagnosis present

## 2013-11-05 DIAGNOSIS — D62 Acute posthemorrhagic anemia: Secondary | ICD-10-CM | POA: Diagnosis not present

## 2013-11-05 DIAGNOSIS — E669 Obesity, unspecified: Secondary | ICD-10-CM | POA: Diagnosis present

## 2013-11-05 DIAGNOSIS — Z01810 Encounter for preprocedural cardiovascular examination: Secondary | ICD-10-CM

## 2013-11-05 DIAGNOSIS — E785 Hyperlipidemia, unspecified: Secondary | ICD-10-CM | POA: Diagnosis present

## 2013-11-05 DIAGNOSIS — I2510 Atherosclerotic heart disease of native coronary artery without angina pectoris: Secondary | ICD-10-CM

## 2013-11-05 DIAGNOSIS — Z6828 Body mass index (BMI) 28.0-28.9, adult: Secondary | ICD-10-CM

## 2013-11-05 DIAGNOSIS — R197 Diarrhea, unspecified: Secondary | ICD-10-CM | POA: Diagnosis not present

## 2013-11-05 DIAGNOSIS — E780 Pure hypercholesterolemia: Secondary | ICD-10-CM | POA: Diagnosis present

## 2013-11-05 DIAGNOSIS — R32 Unspecified urinary incontinence: Secondary | ICD-10-CM | POA: Diagnosis not present

## 2013-11-05 DIAGNOSIS — D631 Anemia in chronic kidney disease: Secondary | ICD-10-CM | POA: Diagnosis present

## 2013-11-05 DIAGNOSIS — I340 Nonrheumatic mitral (valve) insufficiency: Secondary | ICD-10-CM | POA: Diagnosis present

## 2013-11-05 DIAGNOSIS — R7881 Bacteremia: Secondary | ICD-10-CM | POA: Diagnosis not present

## 2013-11-05 DIAGNOSIS — I4891 Unspecified atrial fibrillation: Secondary | ICD-10-CM

## 2013-11-05 DIAGNOSIS — R4701 Aphasia: Secondary | ICD-10-CM | POA: Diagnosis not present

## 2013-11-05 DIAGNOSIS — E1165 Type 2 diabetes mellitus with hyperglycemia: Secondary | ICD-10-CM | POA: Diagnosis present

## 2013-11-05 DIAGNOSIS — J9600 Acute respiratory failure, unspecified whether with hypoxia or hypercapnia: Secondary | ICD-10-CM | POA: Diagnosis present

## 2013-11-05 DIAGNOSIS — Z85828 Personal history of other malignant neoplasm of skin: Secondary | ICD-10-CM

## 2013-11-05 DIAGNOSIS — I480 Paroxysmal atrial fibrillation: Secondary | ICD-10-CM | POA: Diagnosis not present

## 2013-11-05 DIAGNOSIS — Z7902 Long term (current) use of antithrombotics/antiplatelets: Secondary | ICD-10-CM

## 2013-11-05 DIAGNOSIS — Z89422 Acquired absence of other left toe(s): Secondary | ICD-10-CM

## 2013-11-05 DIAGNOSIS — R21 Rash and other nonspecific skin eruption: Secondary | ICD-10-CM | POA: Diagnosis not present

## 2013-11-05 DIAGNOSIS — I214 Non-ST elevation (NSTEMI) myocardial infarction: Secondary | ICD-10-CM | POA: Diagnosis present

## 2013-11-05 DIAGNOSIS — J156 Pneumonia due to other aerobic Gram-negative bacteria: Secondary | ICD-10-CM | POA: Diagnosis not present

## 2013-11-05 DIAGNOSIS — R1310 Dysphagia, unspecified: Secondary | ICD-10-CM | POA: Diagnosis not present

## 2013-11-05 DIAGNOSIS — T82857A Stenosis of cardiac prosthetic devices, implants and grafts, initial encounter: Secondary | ICD-10-CM | POA: Diagnosis present

## 2013-11-05 DIAGNOSIS — E1122 Type 2 diabetes mellitus with diabetic chronic kidney disease: Secondary | ICD-10-CM | POA: Diagnosis present

## 2013-11-05 DIAGNOSIS — I252 Old myocardial infarction: Secondary | ICD-10-CM | POA: Diagnosis not present

## 2013-11-05 DIAGNOSIS — Y92239 Unspecified place in hospital as the place of occurrence of the external cause: Secondary | ICD-10-CM

## 2013-11-05 DIAGNOSIS — Z951 Presence of aortocoronary bypass graft: Secondary | ICD-10-CM

## 2013-11-05 DIAGNOSIS — G9341 Metabolic encephalopathy: Secondary | ICD-10-CM | POA: Diagnosis not present

## 2013-11-05 DIAGNOSIS — I129 Hypertensive chronic kidney disease with stage 1 through stage 4 chronic kidney disease, or unspecified chronic kidney disease: Secondary | ICD-10-CM | POA: Diagnosis present

## 2013-11-05 DIAGNOSIS — I25110 Atherosclerotic heart disease of native coronary artery with unstable angina pectoris: Secondary | ICD-10-CM

## 2013-11-05 DIAGNOSIS — E1121 Type 2 diabetes mellitus with diabetic nephropathy: Secondary | ICD-10-CM | POA: Diagnosis present

## 2013-11-05 DIAGNOSIS — R5381 Other malaise: Secondary | ICD-10-CM | POA: Diagnosis not present

## 2013-11-05 DIAGNOSIS — T8089XA Other complications following infusion, transfusion and therapeutic injection, initial encounter: Secondary | ICD-10-CM | POA: Diagnosis not present

## 2013-11-05 DIAGNOSIS — F329 Major depressive disorder, single episode, unspecified: Secondary | ICD-10-CM | POA: Diagnosis not present

## 2013-11-05 DIAGNOSIS — G451 Carotid artery syndrome (hemispheric): Secondary | ICD-10-CM

## 2013-11-05 DIAGNOSIS — R079 Chest pain, unspecified: Secondary | ICD-10-CM | POA: Diagnosis present

## 2013-11-05 DIAGNOSIS — J158 Pneumonia due to other specified bacteria: Secondary | ICD-10-CM | POA: Diagnosis not present

## 2013-11-05 DIAGNOSIS — E11649 Type 2 diabetes mellitus with hypoglycemia without coma: Secondary | ICD-10-CM | POA: Diagnosis not present

## 2013-11-05 DIAGNOSIS — K219 Gastro-esophageal reflux disease without esophagitis: Secondary | ICD-10-CM | POA: Diagnosis present

## 2013-11-05 DIAGNOSIS — E876 Hypokalemia: Secondary | ICD-10-CM | POA: Diagnosis not present

## 2013-11-05 DIAGNOSIS — N170 Acute kidney failure with tubular necrosis: Secondary | ICD-10-CM | POA: Diagnosis not present

## 2013-11-05 DIAGNOSIS — I4892 Unspecified atrial flutter: Secondary | ICD-10-CM | POA: Diagnosis not present

## 2013-11-05 DIAGNOSIS — I272 Other secondary pulmonary hypertension: Secondary | ICD-10-CM | POA: Diagnosis present

## 2013-11-05 DIAGNOSIS — Z953 Presence of xenogenic heart valve: Secondary | ICD-10-CM

## 2013-11-05 DIAGNOSIS — R569 Unspecified convulsions: Secondary | ICD-10-CM | POA: Diagnosis not present

## 2013-11-05 DIAGNOSIS — B9689 Other specified bacterial agents as the cause of diseases classified elsewhere: Secondary | ICD-10-CM | POA: Diagnosis not present

## 2013-11-05 DIAGNOSIS — I249 Acute ischemic heart disease, unspecified: Secondary | ICD-10-CM

## 2013-11-05 DIAGNOSIS — G459 Transient cerebral ischemic attack, unspecified: Secondary | ICD-10-CM

## 2013-11-05 DIAGNOSIS — J9811 Atelectasis: Secondary | ICD-10-CM | POA: Diagnosis not present

## 2013-11-05 HISTORY — PX: LEFT HEART CATHETERIZATION WITH CORONARY ANGIOGRAM: SHX5451

## 2013-11-05 LAB — COMPREHENSIVE METABOLIC PANEL
ALK PHOS: 85 U/L (ref 39–117)
ALT: 9 U/L (ref 0–35)
ANION GAP: 14 (ref 5–15)
AST: 15 U/L (ref 0–37)
Albumin: 3.7 g/dL (ref 3.5–5.2)
BILIRUBIN TOTAL: 0.7 mg/dL (ref 0.3–1.2)
BUN: 27 mg/dL — AB (ref 6–23)
CHLORIDE: 93 meq/L — AB (ref 96–112)
CO2: 22 meq/L (ref 19–32)
Calcium: 8.7 mg/dL (ref 8.4–10.5)
Creatinine, Ser: 1.57 mg/dL — ABNORMAL HIGH (ref 0.50–1.10)
GFR, EST AFRICAN AMERICAN: 35 mL/min — AB (ref >90)
GFR, EST NON AFRICAN AMERICAN: 30 mL/min — AB (ref >90)
GLUCOSE: 260 mg/dL — AB (ref 70–99)
POTASSIUM: 5 meq/L (ref 3.7–5.3)
SODIUM: 129 meq/L — AB (ref 137–147)
Total Protein: 6.9 g/dL (ref 6.0–8.3)

## 2013-11-05 LAB — CBC
HEMATOCRIT: 30.7 % — AB (ref 36.0–46.0)
HEMOGLOBIN: 10.5 g/dL — AB (ref 12.0–15.0)
MCH: 31.5 pg (ref 26.0–34.0)
MCHC: 34.2 g/dL (ref 30.0–36.0)
MCV: 92.2 fL (ref 78.0–100.0)
Platelets: 231 10*3/uL (ref 150–400)
RBC: 3.33 MIL/uL — ABNORMAL LOW (ref 3.87–5.11)
RDW: 12.8 % (ref 11.5–15.5)
WBC: 6.6 10*3/uL (ref 4.0–10.5)

## 2013-11-05 LAB — I-STAT TROPONIN, ED
Troponin i, poc: 0.02 ng/mL (ref 0.00–0.08)
Troponin i, poc: 0.03 ng/mL (ref 0.00–0.08)

## 2013-11-05 LAB — PROTIME-INR
INR: 1.13 (ref 0.00–1.49)
Prothrombin Time: 14.5 seconds (ref 11.6–15.2)

## 2013-11-05 LAB — PLATELET INHIBITION P2Y12: Platelet Function  P2Y12: 132 [PRU] — ABNORMAL LOW (ref 194–418)

## 2013-11-05 LAB — MRSA PCR SCREENING: MRSA by PCR: NEGATIVE

## 2013-11-05 LAB — GLUCOSE, CAPILLARY
GLUCOSE-CAPILLARY: 223 mg/dL — AB (ref 70–99)
Glucose-Capillary: 110 mg/dL — ABNORMAL HIGH (ref 70–99)

## 2013-11-05 SURGERY — LEFT HEART CATHETERIZATION WITH CORONARY ANGIOGRAM
Anesthesia: LOCAL

## 2013-11-05 MED ORDER — INSULIN GLARGINE 100 UNIT/ML ~~LOC~~ SOLN: 20.0000 [IU] | Freq: Every day | SUBCUTANEOUS | Status: DC

## 2013-11-05 MED ORDER — HEPARIN BOLUS VIA INFUSION
4000.0000 [IU] | Freq: Once | INTRAVENOUS | Status: DC
  Filled 2013-11-05: qty 4000

## 2013-11-05 MED ORDER — SODIUM CHLORIDE 0.9 % IV SOLN: 1.0000 mL/kg/h | INTRAVENOUS | Status: AC

## 2013-11-05 MED ORDER — NITROGLYCERIN 1 MG/10 ML FOR IR/CATH LAB
INTRA_ARTERIAL | Status: AC
  Filled 2013-11-05: qty 10

## 2013-11-05 MED ORDER — SODIUM CHLORIDE 0.9 % IV SOLN: 250.0000 mL | INTRAVENOUS | Status: DC | PRN

## 2013-11-05 MED ORDER — ASPIRIN 81 MG PO CHEW
81.0000 mg | CHEWABLE_TABLET | ORAL | Status: DC
Start: 2013-11-06 — End: 2013-11-06

## 2013-11-05 MED ORDER — SODIUM CHLORIDE 0.9 % IJ SOLN: 3.0000 mL | INTRAMUSCULAR | Status: DC | PRN

## 2013-11-05 MED ORDER — INSULIN ASPART 100 UNIT/ML ~~LOC~~ SOLN
0.0000 [IU] | Freq: Every day | SUBCUTANEOUS | Status: DC
  Administered 2013-11-07: 2 [IU] via SUBCUTANEOUS

## 2013-11-05 MED ORDER — SIMVASTATIN 20 MG PO TABS
20.0000 mg | ORAL_TABLET | Freq: Every evening | ORAL | Status: DC
  Administered 2013-11-05 – 2013-11-07 (×3): 20 mg via ORAL
  Filled 2013-11-05 (×4): qty 1

## 2013-11-05 MED ORDER — PANTOPRAZOLE SODIUM 40 MG PO TBEC
40.0000 mg | DELAYED_RELEASE_TABLET | Freq: Two times a day (BID) | ORAL | Status: DC
  Administered 2013-11-05 – 2013-11-07 (×5): 40 mg via ORAL
  Filled 2013-11-05 (×5): qty 1

## 2013-11-05 MED ORDER — MIDAZOLAM HCL 2 MG/2ML IJ SOLN
INTRAMUSCULAR | Status: AC
  Filled 2013-11-05: qty 2

## 2013-11-05 MED ORDER — INSULIN ASPART 100 UNIT/ML FLEXPEN
7.0000 [IU] | PEN_INJECTOR | Freq: Three times a day (TID) | SUBCUTANEOUS | Status: DC
Start: 2013-11-05 — End: 2013-11-05

## 2013-11-05 MED ORDER — SODIUM CHLORIDE 0.9 % IJ SOLN: 3.0000 mL | Freq: Two times a day (BID) | INTRAMUSCULAR | Status: DC

## 2013-11-05 MED ORDER — NITROGLYCERIN 0.4 MG SL SUBL
0.4000 mg | SUBLINGUAL_TABLET | SUBLINGUAL | Status: DC | PRN
  Administered 2013-11-06 (×2): 0.4 mg via SUBLINGUAL
  Filled 2013-11-05 (×2): qty 1

## 2013-11-05 MED ORDER — FENTANYL CITRATE 0.05 MG/ML IJ SOLN
INTRAMUSCULAR | Status: AC
  Filled 2013-11-05: qty 2

## 2013-11-05 MED ORDER — SODIUM CHLORIDE 0.9 % IV SOLN
250.0000 mL | INTRAVENOUS | Status: DC | PRN
Start: 2013-11-05 — End: 2013-11-08

## 2013-11-05 MED ORDER — ISOSORBIDE MONONITRATE ER 60 MG PO TB24
60.0000 mg | ORAL_TABLET | Freq: Every day | ORAL | Status: DC
  Administered 2013-11-05 – 2013-11-07 (×3): 60 mg via ORAL
  Filled 2013-11-05 (×4): qty 1

## 2013-11-05 MED ORDER — ASPIRIN EC 81 MG PO TBEC: 81.0000 mg | DELAYED_RELEASE_TABLET | Freq: Every day | ORAL | Status: DC

## 2013-11-05 MED ORDER — INSULIN ASPART 100 UNIT/ML ~~LOC~~ SOLN
0.0000 [IU] | Freq: Three times a day (TID) | SUBCUTANEOUS | Status: DC
  Administered 2013-11-05: 5 [IU] via SUBCUTANEOUS
  Administered 2013-11-06 (×2): 3 [IU] via SUBCUTANEOUS
  Administered 2013-11-06 – 2013-11-07 (×2): 5 [IU] via SUBCUTANEOUS
  Administered 2013-11-07 (×2): 3 [IU] via SUBCUTANEOUS

## 2013-11-05 MED ORDER — HEPARIN (PORCINE) IN NACL 100-0.45 UNIT/ML-% IJ SOLN
1000.0000 [IU]/h | INTRAMUSCULAR | Status: DC
  Filled 2013-11-05: qty 250

## 2013-11-05 MED ORDER — RANOLAZINE ER 500 MG PO TB12
500.0000 mg | ORAL_TABLET | Freq: Two times a day (BID) | ORAL | Status: DC
  Administered 2013-11-05 – 2013-11-07 (×5): 500 mg via ORAL
  Filled 2013-11-05 (×7): qty 1

## 2013-11-05 MED ORDER — HEPARIN (PORCINE) IN NACL 2-0.9 UNIT/ML-% IJ SOLN
INTRAMUSCULAR | Status: AC
  Filled 2013-11-05: qty 1000

## 2013-11-05 MED ORDER — LIDOCAINE HCL (PF) 1 % IJ SOLN
INTRAMUSCULAR | Status: AC
  Filled 2013-11-05: qty 30

## 2013-11-05 MED ORDER — INSULIN DETEMIR 100 UNIT/ML ~~LOC~~ SOLN
6.0000 [IU] | Freq: Every day | SUBCUTANEOUS | Status: DC
  Filled 2013-11-05: qty 0.06

## 2013-11-05 MED ORDER — ONDANSETRON HCL 4 MG/2ML IJ SOLN
4.0000 mg | Freq: Four times a day (QID) | INTRAMUSCULAR | Status: DC | PRN
  Administered 2013-11-06 – 2013-11-08 (×2): 4 mg via INTRAVENOUS
  Filled 2013-11-05 (×2): qty 2

## 2013-11-05 MED ORDER — MORPHINE SULFATE 4 MG/ML IJ SOLN
4.0000 mg | Freq: Once | INTRAMUSCULAR | Status: AC
  Administered 2013-11-05: 4 mg via INTRAVENOUS
  Filled 2013-11-05: qty 1

## 2013-11-05 MED ORDER — CARVEDILOL 6.25 MG PO TABS
6.2500 mg | ORAL_TABLET | Freq: Two times a day (BID) | ORAL | Status: DC
  Administered 2013-11-05 – 2013-11-07 (×4): 6.25 mg via ORAL
  Filled 2013-11-05 (×8): qty 1

## 2013-11-05 MED ORDER — SODIUM CHLORIDE 0.9 % IV SOLN: INTRAVENOUS | Status: DC

## 2013-11-05 MED ORDER — SODIUM CHLORIDE 0.9 % IJ SOLN
3.0000 mL | Freq: Two times a day (BID) | INTRAMUSCULAR | Status: DC
  Administered 2013-11-07: 3 mL via INTRAVENOUS

## 2013-11-05 MED ORDER — FUROSEMIDE 10 MG/ML IJ SOLN
40.0000 mg | Freq: Once | INTRAMUSCULAR | Status: AC
  Administered 2013-11-05: 40 mg via INTRAVENOUS
  Filled 2013-11-05: qty 4

## 2013-11-05 MED ORDER — ALPRAZOLAM 0.5 MG PO TABS: 1.0000 mg | ORAL_TABLET | Freq: Two times a day (BID) | ORAL | Status: DC | PRN

## 2013-11-05 MED ORDER — NITROGLYCERIN 0.4 MG SL SUBL: 0.4000 mg | SUBLINGUAL_TABLET | SUBLINGUAL | Status: DC | PRN

## 2013-11-05 MED ORDER — HEPARIN (PORCINE) IN NACL 100-0.45 UNIT/ML-% IJ SOLN
1300.0000 [IU]/h | INTRAMUSCULAR | Status: DC
  Administered 2013-11-05: 1000 [IU]/h via INTRAVENOUS
  Filled 2013-11-05 (×2): qty 250

## 2013-11-05 MED ORDER — LOSARTAN POTASSIUM 25 MG PO TABS
25.0000 mg | ORAL_TABLET | Freq: Every day | ORAL | Status: DC
  Administered 2013-11-05 – 2013-11-07 (×3): 25 mg via ORAL
  Filled 2013-11-05 (×4): qty 1

## 2013-11-05 MED ORDER — SODIUM CHLORIDE 0.9 % IJ SOLN
3.0000 mL | Freq: Two times a day (BID) | INTRAMUSCULAR | Status: DC
  Administered 2013-11-05 (×2): 3 mL via INTRAVENOUS

## 2013-11-05 MED ORDER — ASPIRIN EC 81 MG PO TBEC
81.0000 mg | DELAYED_RELEASE_TABLET | Freq: Every day | ORAL | Status: DC
  Administered 2013-11-06 – 2013-11-07 (×2): 81 mg via ORAL
  Filled 2013-11-05 (×3): qty 1

## 2013-11-05 MED ORDER — ACETAMINOPHEN 325 MG PO TABS: 650.0000 mg | ORAL_TABLET | ORAL | Status: DC | PRN

## 2013-11-05 MED ORDER — INSULIN DETEMIR 100 UNIT/ML ~~LOC~~ SOLN
12.0000 [IU] | Freq: Every day | SUBCUTANEOUS | Status: DC
  Filled 2013-11-05 (×2): qty 0.12

## 2013-11-05 NOTE — Progress Notes (Signed)
Subjective:    Patient ID: Claudia [name], female    DOB: 09/25/1931, 80 y.o.   MRN: [id number]  HPI Over the last week, the patient has been having episodes of central chest pain that lasts 5-10 minutes. It is worse with activity although it can occur at rest. The pain improves with nitroglycerin. This morning she is having mild pressure in the center of her chest and also some shortness of breath. EKG today shows downsloping ST depression in lead 1-lead to aVL aVF in lead 3. She also has downsloping ST depression in V5 and V6. This is concerning for ischemia. Patient had one baby aspirin this morning. I gave her 3 more baby aspirin that she right now and also one nitroglycerin. I also put the patient on oxygen 2 L via nasal cannula and called EMS to transport the patient for evaluation in the emergency room.  She had history of significant coronary artery disease and multiple non-ST elevation myocardial infarctions. Past Medical History  Diagnosis Date  . GERD (gastroesophageal reflux disease)   . PUD (peptic ulcer disease)   . Anemia   . Hypertension   . S/P endoscopy Aug 2011    3 superficial gastric ulcers, NSAID-induced  . S/P colonoscopy Sept 2011    left-sided diverticula, tubular adenoma  . Coronary artery disease   . Shortness of breath   . Diabetes mellitus     insulin dependent  . Headache(784.0)     rare migraines  . Cancer     hx of skin cancer  . Arthritis   . Osteopenia   . Hypercholesterolemia   . SIADH (syndrome of inappropriate ADH production)   . CHF (congestive heart failure)   . Myocardial infarction 2013   Past Surgical History  Procedure Laterality Date  . Tonsillectomy    . Appendectomy    . Eye surgery      cataracts  . Cardiac stents  07/19/2011  . Esophagogastroduodenoscopy  10/16/09    normal without barrett's/three superficial gastric ulcers  . Colonoscopy  12/04/09    normal rectum/left-sided diverticula  . Joint replacement  arthroscopy to  knee  . Toe amputation Left 2013    left second toe amputation  . Cardiac catheterization  01/22/2013  . Coronary angioplasty  07/2011   Current Outpatient Prescriptions on File Prior to Visit  Medication Sig Dispense Refill  . aspirin EC 81 MG tablet Take 81 mg by mouth daily.      . B-D ULTRAFINE III SHORT PEN 31G X 8 MM MISC USE AS DIRECTED WITH LANTUS PEN  100 each  5  . Blood Glucose Monitoring Suppl (ACCU-CHEK NANO SMARTVIEW) W/DEVICE KIT Pt needs monitor, strips (box of 400/ 4 refill), lancets (box of 400/4 refills) She checks her BS qid (before meals & QHS) Dx: 250.00  1 kit  0  . carvedilol (COREG) 6.25 MG tablet Take 6.25 mg by mouth 2 (two) times daily with a meal.       . clopidogrel (PLAVIX) 75 MG tablet Take 1 tablet (75 mg total) by mouth daily with breakfast.  30 tablet  6  . furosemide (LASIX) 40 MG tablet TAKE 1/2 TABLET BY MOUTH EVERY DAY AS NEEDED  30 tablet  3  . insulin aspart (NOVOLOG FLEXPEN) 100 UNIT/ML FlexPen Inject 7-10 Units into the skin 3 (three) times daily with meals. Inject 7 units in the morning 10 units in the afternoon and 10 units at bedtime      .  insulin glargine (LANTUS SOLOSTAR) 100 UNIT/ML injection Inject 20 Units into the skin at bedtime.      . isosorbide mononitrate (IMDUR) 60 MG 24 hr tablet Take 1 tablet (60 mg total) by mouth daily.  30 tablet  3  . losartan (COZAAR) 50 MG tablet TAKE 1/2 TABLET BY MOUTH EVERY DAY  30 tablet  3  . nitroGLYCERIN (NITROSTAT) 0.4 MG SL tablet Place 0.4 mg under the tongue every 5 (five) minutes as needed for chest pain.      Marland Kitchen omeprazole (PRILOSEC) 20 MG capsule Take 20 mg by mouth 2 (two) times daily.       . pantoprazole (PROTONIX) 40 MG tablet Take 1 tablet (40 mg total) by mouth 2 (two) times daily before a meal.  60 tablet  3  . ranolazine (RANEXA) 500 MG 12 hr tablet Take 1 tablet (500 mg total) by mouth 2 (two) times daily.  60 tablet  1  . simvastatin (ZOCOR) 20 MG tablet Take 1 tablet (20 mg total) by mouth  every evening.  90 tablet  1  . Vorapaxar Sulfate 2.08 MG TABS Take 1 tablet by mouth daily.  30 tablet  1   No current facility-administered medications on file prior to visit.   Allergies  Allergen Reactions  . Aspirin Other (See Comments)    Intolerant to high doses   History   Social History  . Marital Status: Widowed    Spouse Name: N/A    Number of Children: N/A  . Years of Education: N/A   Occupational History  . Not on file.   Social History Main Topics  . Smoking status: Never Smoker   . Smokeless tobacco: Never Used  . Alcohol Use: No  . Drug Use: No  . Sexual Activity: No   Other Topics Concern  . Not on file   Social History Narrative  . No narrative on file      Review of Systems  All other systems reviewed and are negative.      Objective:   Physical Exam  Vitals reviewed. Cardiovascular: Normal rate, regular rhythm and normal heart sounds.   No murmur heard. Pulmonary/Chest: Effort normal and breath sounds normal. No respiratory distress. She has no wheezes. She has no rales.  Abdominal: Soft. Bowel sounds are normal. She exhibits no distension. There is no tenderness. There is no rebound.  Musculoskeletal: She exhibits no edema.          Assessment & Plan:  Chest pain, unspecified chest pain type  Chest pain is concerning for acute coronary syndrome and unstable angina. The patient has had 324 mg of aspirin total. She's also had one nitroglycerin. Patient will be transported by EMS to the emergency room for evaluation and treatment.

## 2013-11-05 NOTE — Progress Notes (Signed)
ANTICOAGULATION CONSULT NOTE - Initial Consult  Pharmacy Consult for heparin Indication: chest pain/ACS  Allergies  Allergen Reactions  . Aspirin Other (See Comments)    Intolerant to high doses    Patient Measurements: Height: 5\' 6"  (167.6 cm) Weight: 191 lb (86.637 kg) IBW/kg (Calculated) : 59.3 Heparin Dosing Weight: 70.2 kg  Vital Signs: Temp: 97.6 F (36.4 C) (09/01 1030) Temp src: Oral (09/01 1030) BP: 154/72 mmHg (09/01 1030) Pulse Rate: 74 (09/01 0923)  Labs:  Recent Labs  11/05/13 1040  HGB 10.5*  HCT 30.7*  PLT 231    Estimated Creatinine Clearance: 33.7 ml/min (by C-G formula based on Cr of 1.5).   Medical History: Past Medical History  Diagnosis Date  . GERD (gastroesophageal reflux disease)   . PUD (peptic ulcer disease)   . Anemia   . Hypertension   . S/P endoscopy Aug 2011    3 superficial gastric ulcers, NSAID-induced  . S/P colonoscopy Sept 2011    left-sided diverticula, tubular adenoma  . Coronary artery disease   . Shortness of breath   . Diabetes mellitus     insulin dependent  . Headache(784.0)     rare migraines  . Cancer     hx of skin cancer  . Arthritis   . Osteopenia   . Hypercholesterolemia   . SIADH (syndrome of inappropriate ADH production)   . CHF (congestive heart failure)   . Myocardial infarction 2013     Assessment: 80 yo F sent to ED from MD office with chest pain concerning for ACS and [place].  Pharmacy consulted to dose heparin. Wt 86.6 kg.  No anticoagulants on PTA med list.  No baseline labs yet.  Pt added on to cath lab schedule.  Goal of Therapy:  Heparin level 0.3-0.7 units/ml Monitor platelets by anticoagulation protocol: Yes   Plan:  Give 4000 units bolus x 1 Start heparin infusion at 1000 units/hr Check anti-Xa level in 8 hours and daily while on heparin Continue to monitor H&H and platelets Eudelia Bunch, Pharm.D. [phone] 11/05/2013 11:04 AM

## 2013-11-05 NOTE — Progress Notes (Signed)
ANTICOAGULATION CONSULT NOTE - Initial Consult  Pharmacy Consult for heparin Indication: chest pain/ACS  Allergies  Allergen Reactions  . Aspirin Other (See Comments)    Intolerant to high doses    Patient Measurements: Height: 5' 7"  (170.2 cm) Weight: 191 lb 2.2 oz (86.7 kg) IBW/kg (Calculated) : 61.6 Heparin Dosing Weight: 80kg  Vital Signs: Temp: 97.6 F (36.4 C) (09/01 1030) Temp src: Oral (09/01 1030) BP: 124/71 mmHg (09/01 1131) Pulse Rate: 82 (09/01 1215)  Labs:  Recent Labs  11/05/13 1040  HGB 10.5*  HCT 30.7*  PLT 231  LABPROT 14.5  INR 1.13  CREATININE 1.57*    Estimated Creatinine Clearance: 32.8 ml/min (by C-G formula based on Cr of 1.57).   Medical History: Past Medical History  Diagnosis Date  . GERD (gastroesophageal reflux disease)   . PUD (peptic ulcer disease)   . Anemia   . Hypertension   . S/P endoscopy Aug 2011    3 superficial gastric ulcers, NSAID-induced  . S/P colonoscopy Sept 2011    left-sided diverticula, tubular adenoma  . Coronary artery disease   . Shortness of breath   . Diabetes mellitus     insulin dependent  . Headache(784.0)     rare migraines  . Cancer     hx of skin cancer  . Arthritis   . Osteopenia   . Hypercholesterolemia   . SIADH (syndrome of inappropriate ADH production)   . CHF (congestive heart failure)   . Myocardial infarction 2013    Medications:  Prescriptions prior to admission  Medication Sig Dispense Refill  . aspirin EC 81 MG tablet Take 81 mg by mouth daily.      . B-D ULTRAFINE III SHORT PEN 31G X 8 MM MISC USE AS DIRECTED WITH LANTUS PEN  100 each  5  . Blood Glucose Monitoring Suppl (ACCU-CHEK NANO SMARTVIEW) W/DEVICE KIT Pt needs monitor, strips (box of 400/ 4 refill), lancets (box of 400/4 refills) She checks her BS qid (before meals & QHS) Dx: 250.00  1 kit  0  . carvedilol (COREG) 6.25 MG tablet Take 6.25 mg by mouth 2 (two) times daily with a meal.       . clopidogrel (PLAVIX) 75  MG tablet Take 1 tablet (75 mg total) by mouth daily with breakfast.  30 tablet  6  . furosemide (LASIX) 40 MG tablet TAKE 1/2 TABLET BY MOUTH EVERY DAY AS NEEDED  30 tablet  3  . insulin aspart (NOVOLOG FLEXPEN) 100 UNIT/ML FlexPen Inject 7-10 Units into the skin 3 (three) times daily with meals. Inject 7 units in the morning 10 units in the afternoon and 10 units at bedtime      . insulin glargine (LANTUS SOLOSTAR) 100 UNIT/ML injection Inject 20 Units into the skin at bedtime.      . isosorbide mononitrate (IMDUR) 60 MG 24 hr tablet Take 1 tablet (60 mg total) by mouth daily.  30 tablet  3  . losartan (COZAAR) 50 MG tablet TAKE 1/2 TABLET BY MOUTH EVERY DAY  30 tablet  3  . nitroGLYCERIN (NITROSTAT) 0.4 MG SL tablet Place 0.4 mg under the tongue every 5 (five) minutes as needed for chest pain.      Marland Kitchen omeprazole (PRILOSEC) 20 MG capsule Take 20 mg by mouth 2 (two) times daily.       . pantoprazole (PROTONIX) 40 MG tablet Take 1 tablet (40 mg total) by mouth 2 (two) times daily before a meal.  60 tablet  3  . ranolazine (RANEXA) 500 MG 12 hr tablet Take 1 tablet (500 mg total) by mouth 2 (two) times daily.  60 tablet  1  . simvastatin (ZOCOR) 20 MG tablet Take 1 tablet (20 mg total) by mouth every evening.  90 tablet  1  . Vorapaxar Sulfate 2.08 MG TABS Take 1 tablet by mouth daily.  30 tablet  1   Scheduled:  . [START ON 11/06/2013] aspirin EC  81 mg Oral Daily  . carvedilol  6.25 mg Oral BID WC  . heparin  4,000 Units Intravenous Once  . insulin aspart  0-15 Units Subcutaneous TID WC  . insulin aspart  0-5 Units Subcutaneous QHS  . insulin detemir  6 Units Subcutaneous QHS  . isosorbide mononitrate  60 mg Oral Daily  . losartan  25 mg Oral Daily  . pantoprazole  40 mg Oral BID AC  . ranolazine  500 mg Oral BID  . simvastatin  20 mg Oral QPM  . sodium chloride  3 mL Intravenous Q12H  . [START ON 11/06/2013] sodium chloride  3 mL Intravenous Q12H    Assessment: 80 yo female s/p cath with  2V CAD to begin heparin 8 hours post sheath removal.  Patient noted for CABG consult. Sheath removed at 12:45pm today.  Goal of Therapy:  Heparin level 0.3-0.7 units/ml Monitor platelets by anticoagulation protocol: Yes   Plan:  -Begin heparin at 9pm at 950 units/hr -Daily heparin level and CBC -Will follow plans for CABG  Hildred Laser, Pharm D 11/05/2013 2:05 PM

## 2013-11-05 NOTE — Care Management Note (Addendum)
    Page 1 of 2   12/13/2013     5:37:52 PM CARE MANAGEMENT NOTE 12/13/2013  Patient:  Claudia [name], Claudia [name]   Account Number:  [id number]  Date Initiated:  11/05/2013  Documentation initiated by:  Elissa Hefty  Subjective/Objective Assessment:   adm w angina     Action/Plan:   from home, pcp dr Cletus Gash pickard   Anticipated DC Date:  12/14/2013   Anticipated DC Plan:  [place] referral  Clinical Social Worker      DC Planning Services  CM consult      Choice offered to / List presented to:             Status of service:  In process, will continue to follow Medicare Important Message given?  YES (If response is "NO", the following Medicare IM given date fields will be blank) Date Medicare IM given:  12/06/2013 Medicare IM given by:  MAYO,HENRIETTA Date Additional Medicare IM given:  12/12/2013 Additional Medicare IM given by:  Shonte Beutler  Discharge Disposition:  [place]  Per UR Regulation:  Reviewed for med. necessity/level of care/duration of stay  If discussed at [place] of Stay Meetings, dates discussed:   11/12/2013  11/14/2013  11/19/2013  11/21/2013  11/26/2013  11/28/2013  12/03/2013  12/05/2013  12/10/2013    Comments:  12/13/13 Ellan Lambert, Rn, BSN [phone] Pt discharged to SNF today, per CSW arrangements.  12/09/13 [place] MSN [place] Medicare will not approve LTAC per liaison - pt must be on a vent 21 days with 3 failed weans to qualify for LTAC.  They also will not approve CIR.  CSW following for SNF placement for rehab.  12/06/13 [place] RN MSN BSN CCM Metabolic encephalopathy per neurology, remains on dopamine gtt.  Not LTAC appropriate per payor.  11/28/13 1450 [place] MSN BSN CCM Remains on dopamine gtt.  CSW following,  11/13/13 Ellan Lambert, RN, BSN [phone] PT recommending SNF at dc.  Will refer to CSW to faciliate poss dc to SNF for rehab when stable.  CM will  follow progress.  11/11/13 [place] MSN BSN CCM Extubated, on 4L/min [place].  Remains on amio/dopa/epi/milrinone/levophed gtts.  11/07/13 Ellan Lambert, RN, BSN [phone] Pt for CABG on 11/08/13.  PTA, pt resides at home with son and granddaughter, and is independent.  Pt states daughter in law plans to take a leave from work when she is discharged to assist her at home for as long as needed. Son is there all the time.  Will follow post op for dc needs.

## 2013-11-05 NOTE — Interval H&P Note (Signed)
History and Physical Interval Note:  11/05/2013 11:54 AM  Claudia [name]  has presented today for surgery, with the diagnosis of cp  The various methods of treatment have been discussed with the patient and family. After consideration of risks, benefits and other options for treatment, the patient has consented to  Procedure(s): LEFT HEART CATHETERIZATION WITH CORONARY ANGIOGRAM (N/A) and possible PCI  as a surgical intervention .  The patient's history has been reviewed, patient examined, no change in status, stable for surgery.  I have reviewed the patient's chart and labs.  Questions were answered to the patient's satisfaction.   Cath Lab Visit (complete for each Cath Lab visit)  Clinical Evaluation Leading to the Procedure:   ACS: Yes.    Non-ACS:    Anginal Classification: CCS IV  Anti-ischemic medical therapy: Maximal Therapy (2 or more classes of medications)  Non-Invasive Test Results: No non-invasive testing performed  Prior CABG: No previous CABG        [facility] R

## 2013-11-05 NOTE — H&P (Signed)
Claudia [name] is an 80 y.o. female.   Chief Complaint: Chest pain and cough 1 week HPI:  Patient is a 80 year old Caucasian female with history of known coronary artery disease and has undergone multiple coronary intervention specifically involving the coronaries of the left system, and she has had recurrent in-stent restenosis involving the circumflex coronary artery and also ramus intermediate ranch of the circumflex coronary artery. On her previous admission on 12/02/2012, when she presented similarly with NSTEMI, we had contemplated consideration for CABG, however she had had significant progression of native vessel disease and single culprit stenosis and hence underwent repeat angioplasty to the ostial circumflex coronary artery with implantation of a 2.75 x 12 mm promos Premier drug-eluting stent.   She had been doing well until about a week ago she started noticing chest tightness. She has been using sublingual nitroglycerin with relief, last night the chest pain was described as very severe again artery with nitroglycerin but not completely. This morning she was seen by Dr. Jenna Luo, EKG revealed significant changes in the lateral leads, due to ongoing chest discomfort, patient was transferred to [facility] emergency room via EMS.  She still continues to have mild chest tightness. Her son is present at the bedside. She is also noticed cough especially when she lays down over the past one week. Mild dyspnea was also evident. No leg edema, no painful swelling of the lower extremities. Patient has had uncontrolled diabetes mellitus, but recently has made significant lifestyle changes and is trying her best to keep blood sugar is well controlled.   Past Medical History  Diagnosis Date  . GERD (gastroesophageal reflux disease)   . PUD (peptic ulcer disease)   . Anemia   . Hypertension   . S/P endoscopy Aug 2011    3 superficial gastric ulcers, NSAID-induced  . S/P colonoscopy Sept 2011     left-sided diverticula, tubular adenoma  . Coronary artery disease   . Shortness of breath   . Diabetes mellitus     insulin dependent  . Headache(784.0)     rare migraines  . Cancer     hx of skin cancer  . Arthritis   . Osteopenia   . Hypercholesterolemia   . SIADH (syndrome of inappropriate ADH production)   . CHF (congestive heart failure)   . Myocardial infarction 2013    Past Surgical History  Procedure Laterality Date  . Tonsillectomy    . Appendectomy    . Eye surgery      cataracts  . Cardiac stents  07/19/2011  . Esophagogastroduodenoscopy  10/16/09    normal without barrett's/three superficial gastric ulcers  . Colonoscopy  12/04/09    normal rectum/left-sided diverticula  . Joint replacement  arthroscopy to knee  . Toe amputation Left 2013    left second toe amputation  . Cardiac catheterization  01/22/2013  . Coronary angioplasty  07/2011    No family history on file. Social History:  reports that she has never smoked. She has never used smokeless tobacco. She reports that she does not drink alcohol or use illicit drugs.  Allergies:  Allergies  Allergen Reactions  . Aspirin Other (See Comments)    Intolerant to high doses   Review of Systems - no recent weight changes, no symptoms to suggest TIA or claudication, diabetes mellitus well-controlled, no palpitation, dizziness or syncope. No visual disturbances, headache or nausea. No history to suggest GI bleed. Other systems negative.  Blood pressure 124/71, pulse 103, temperature 97.6 F (  36.4 C), temperature source Oral, resp. rate 25, height _0  (1.676 m), weight 86.637 kg (191 lb), SpO2 94.00%. General appearance: alert, cooperative, no distress and mildly obese  Eyes: negative findings: lids and lashes normal  Neck: no adenopathy, no carotid bruit, no JVD, supple, symmetrical, trachea midline and thyroid not enlarged, symmetric, no tenderness/mass/nodules  Neck: JVP - normal, carotids 2+= without bruits   Resp: Fine crackles heard at the bases bilaterally, bilateral diffuse expiratory rales present.  Chest wall: no tenderness  Cardio: regular rate and rhythm, S1, S2 normal, no murmur, click, rub or gallop  GI: soft, non-tender; bowel sounds normal; no masses, no organomegaly  Extremities: extremities normal, right thigh has mild bruising without any hematoma or tenderness or signs of inflammation, no cyanosis. Chronic 1-2+ leg edema evident. Pitting edema below the knee.  Pulses: Absent right radial pulse, femoral pulse normal, popliteal pulse normal, absent pedal pulses. No evidence of arterial insufficiency in the lower extremities. Neurologic: Grossly normal  Results for orders placed during the hospital encounter of 11/05/13 (from the past 48 hour(s))  CBC     Status: Abnormal   Collection Time    11/05/13 10:40 AM      Result Value Ref Range   WBC 6.6  4.0 - 10.5 K/uL   RBC 3.33 (*) 3.87 - 5.11 MIL/uL   Hemoglobin 10.5 (*) 12.0 - 15.0 g/dL   HCT 30.7 (*) 36.0 - 46.0 %   MCV 92.2  78.0 - 100.0 fL   MCH 31.5  26.0 - 34.0 pg   MCHC 34.2  30.0 - 36.0 g/dL   RDW 12.8  11.5 - 15.5 %   Platelets 231  150 - 400 K/uL  PROTIME-INR     Status: None   Collection Time    11/05/13 10:40 AM      Result Value Ref Range   Prothrombin Time 14.5  11.6 - 15.2 seconds   INR 1.13  0.00 - 1.49  COMPREHENSIVE METABOLIC PANEL     Status: Abnormal   Collection Time    11/05/13 10:40 AM      Result Value Ref Range   Sodium 129 (*) 137 - 147 mEq/L   Potassium 5.0  3.7 - 5.3 mEq/L   Chloride 93 (*) 96 - 112 mEq/L   CO2 22  19 - 32 mEq/L   Glucose, Bld 260 (*) 70 - 99 mg/dL   BUN 27 (*) 6 - 23 mg/dL   Creatinine, Ser 1.57 (*) 0.50 - 1.10 mg/dL   Calcium 8.7  8.4 - 10.5 mg/dL   Total Protein 6.9  6.0 - 8.3 g/dL   Albumin 3.7  3.5 - 5.2 g/dL   AST 15  0 - 37 U/L   ALT 9  0 - 35 U/L   Alkaline Phosphatase 85  39 - 117 U/L   Total Bilirubin 0.7  0.3 - 1.2 mg/dL   GFR calc non Af Amer 30 (*) >90  mL/min   GFR calc Af Amer 35 (*) >90 mL/min   Comment: (NOTE)     The eGFR has been calculated using the CKD EPI equation.     This calculation has not been validated in all clinical situations.     eGFR's persistently <90 mL/min signify possible Chronic Kidney     Disease.   Anion gap 14  5 - 15  I-STAT TROPOININ, ED     Status: None   Collection Time    11/05/13 10:45 AM  Result Value Ref Range   Troponin i, poc 0.02  0.00 - 0.08 ng/mL   Comment 3            Comment: Due to the release kinetics of cTnI,     a negative result within the first hours     of the onset of symptoms does not rule out     myocardial infarction with certainty.     If myocardial infarction is still suspected,     repeat the test at appropriate intervals.   Dg Chest 2 View  11/05/2013   CLINICAL DATA:  Chest pain.  EXAM: CHEST  2 VIEW  COMPARISON:  06/26/2013.  FINDINGS: Mediastinum is unremarkable. Coronary artery calcification . Mild cardiomegaly. Pulmonary venous prominence. Diffuse bilateral interstitial prominence with Kerley B-lines. Small pleural effusions. These findings are consistent with congestive heart failure and interstitial edema. Similar findings noted on prior study.  IMPRESSION: 1. Congestive heart failure with interstitial edema and small pleural effusions. Pneumonitis cannot be excluded.  2. Coronary artery calcification noted consistent with coronary artery disease .   Electronically Signed   By: Marcello Moores  Register   On: 11/05/2013 11:02    Labs:   Lab Results  Component Value Date   WBC 6.6 11/05/2013   HGB 10.5* 11/05/2013   HCT 30.7* 11/05/2013   MCV 92.2 11/05/2013   PLT 231 11/05/2013    Recent Labs Lab 11/05/13 1040  NA 129*  K 5.0  CL 93*  CO2 22  BUN 27*  CREATININE 1.57*  CALCIUM 8.7  PROT 6.9  BILITOT 0.7  ALKPHOS 85  ALT 9  AST 15  GLUCOSE 260*   Lab Results  Component Value Date   CKTOTAL 311* 06/27/2013   CKMB 11.6* 06/27/2013   TROPONINI 7.84* 06/27/2013     Lipid Panel     Component Value Date/Time   CHOL 125 12/03/2012 0355   TRIG 42 12/03/2012 0355   HDL 72 12/03/2012 0355   CHOLHDL 1.7 12/03/2012 0355   VLDL 8 12/03/2012 0355   LDLCALC 45 12/03/2012 0355    EKG: 11/05/2013: Normal sinus rhythm, horizontal ST segment depression in inferior leads and lateral leads suggestive of non-ST elevation myocardial infarction. Changes are new compared to prior EKG where downsloping ST changes with T wave inversion was evident.   Assessment/Plan 1. Unstable angina pectoris, suspect lateral wall ischemia, with pulmonary edema 2. Coronary artery disease of native vessels, multiple coronary angiography and angioplasty to the culprit circumflex coronary artery. I suspect recurrence of in-stent restenosis. Please see above for complete details of the prior coronary interventions. 3. Diabetes mellitus type 2 uncontrolled with renal manifestation. Stage III chronic kidney disease. 4. Hypertension 5. Hyperlipidemia  Recommendation: Patient will be taken for repeat coronary angiography due to ongoing chest discomfort, clinical evidence of bone edema. We will limit contrast use due to renal insufficiency. All questions regarding coronary angiography was discussed with the patient and her son at the bedside.  Laverda Page, MD 11/05/2013, 11:46 AM [facility]. [place] Pager: [phone] Office: [phone] If no answer: Cell:  [phone]

## 2013-11-05 NOTE — Progress Notes (Signed)
Echocardiogram 2D Echocardiogram has been performed.  Claudia [name] 11/05/2013, 4:38 PM

## 2013-11-05 NOTE — ED Provider Notes (Signed)
CSN: [id number]     Arrival date & time 11/05/13  1021 History   First MD Initiated Contact with Patient 11/05/13 1024     Chief Complaint  Patient presents with  . Chest Pain     HPI Comments: Pt is a 80 y/o female w/ PMHx of CAD, NSTEMi, CHF and DM2 who presents to the ED from [facility] Medicine clinic for 1/10 in severity chest pain. On EKG it was found that she had EKG changes concerning for inferior lateral ischemia at which time she was given aspirin 324 mg and NG in Family Medicine clinic. She was seen in clinic originally for diabetes f/u. She reports that chest pain in intermittent x 1 week and does not occur daily. Chest pain occurs mainly at night. Yesterday she had an episode of chest pain after shopping that she attributes to stress. She took sublingual NG and chest pain resolved w/in 5 mins. This morning around 8:00am she had another episode of chest pain that she thought was just gas. Her CBG this morning was 68 after drinking orange juice and patient reports having only 2 hypoglycemic episodes before. She does report feeling zoned out this morning. Denies fevers, chills, or bloody bowel movements.    Patient is a 80 y.o. female presenting with chest pain. The history is provided by the patient.  Chest Pain Pain location:  Substernal area Pain quality: dull   Pain radiates to:  Does not radiate Pain severity:  Mild (1/10) Duration:  1 week Timing:  Intermittent (not daily) Progression:  Unchanged Chronicity:  Chronic Relieved by:  Nitroglycerin Associated symptoms: no fever and no nausea   Risk factors: coronary artery disease and diabetes mellitus      Past Medical History  Diagnosis Date  . GERD (gastroesophageal reflux disease)   . PUD (peptic ulcer disease)   . Anemia   . Hypertension   . S/P endoscopy Aug 2011    3 superficial gastric ulcers, NSAID-induced  . S/P colonoscopy Sept 2011    left-sided diverticula, tubular adenoma  . Coronary artery disease   .  Shortness of breath   . Diabetes mellitus     insulin dependent  . Headache(784.0)     rare migraines  . Cancer     hx of skin cancer  . Arthritis   . Osteopenia   . Hypercholesterolemia   . SIADH (syndrome of inappropriate ADH production)   . CHF (congestive heart failure)   . Myocardial infarction 2013   Past Surgical History  Procedure Laterality Date  . Tonsillectomy    . Appendectomy    . Eye surgery      cataracts  . Cardiac stents  07/19/2011  . Esophagogastroduodenoscopy  10/16/09    normal without barrett's/three superficial gastric ulcers  . Colonoscopy  12/04/09    normal rectum/left-sided diverticula  . Joint replacement  arthroscopy to knee  . Toe amputation Left 2013    left second toe amputation  . Cardiac catheterization  01/22/2013  . Coronary angioplasty  07/2011   No family history on file. History  Substance Use Topics  . Smoking status: Never Smoker   . Smokeless tobacco: Never Used  . Alcohol Use: No   OB History   Grav Para Term Preterm Abortions TAB SAB Ect Mult Living                 Review of Systems  Constitutional: Negative for fever and chills.  Cardiovascular: Positive for chest pain.  Gastrointestinal:  Negative for nausea and blood in stool.      Allergies  Aspirin  Home Medications   Prior to Admission medications   Medication Sig Start Date End Date Taking? Authorizing Provider  aspirin EC 81 MG tablet Take 81 mg by mouth daily.    Historical Provider, MD  B-D ULTRAFINE III SHORT PEN 31G X 8 MM MISC USE AS DIRECTED WITH LANTUS PEN 11/21/12   Susy Frizzle, MD  Blood Glucose Monitoring Suppl (ACCU-CHEK NANO SMARTVIEW) W/DEVICE KIT Pt needs monitor, strips (box of 400/ 4 refill), lancets (box of 400/4 refills) She checks her BS qid (before meals & QHS) Dx: 250.00 02/13/13   Susy Frizzle, MD  carvedilol (COREG) 6.25 MG tablet Take 6.25 mg by mouth 2 (two) times daily with a meal.     Historical Provider, MD  clopidogrel  (PLAVIX) 75 MG tablet Take 1 tablet (75 mg total) by mouth daily with breakfast. 06/28/13   Laverda Page, MD  furosemide (LASIX) 40 MG tablet TAKE 1/2 TABLET BY MOUTH EVERY DAY AS NEEDED 08/14/13   Susy Frizzle, MD  insulin aspart (NOVOLOG FLEXPEN) 100 UNIT/ML FlexPen Inject 7-10 Units into the skin 3 (three) times daily with meals. Inject 7 units in the morning 10 units in the afternoon and 10 units at bedtime    Historical Provider, MD  insulin glargine (LANTUS SOLOSTAR) 100 UNIT/ML injection Inject 20 Units into the skin at bedtime.    Historical Provider, MD  isosorbide mononitrate (IMDUR) 60 MG 24 hr tablet Take 1 tablet (60 mg total) by mouth daily. 09/05/12   Laverda Page, MD  losartan (COZAAR) 50 MG tablet TAKE 1/2 TABLET BY MOUTH EVERY DAY 08/14/13   Susy Frizzle, MD  nitroGLYCERIN (NITROSTAT) 0.4 MG SL tablet Place 0.4 mg under the tongue every 5 (five) minutes as needed for chest pain.    Historical Provider, MD  omeprazole (PRILOSEC) 20 MG capsule Take 20 mg by mouth 2 (two) times daily.     Historical Provider, MD  pantoprazole (PROTONIX) 40 MG tablet Take 1 tablet (40 mg total) by mouth 2 (two) times daily before a meal. 07/24/13   Orvil Feil, NP  ranolazine (RANEXA) 500 MG 12 hr tablet Take 1 tablet (500 mg total) by mouth 2 (two) times daily. 01/23/13   Laverda Page, MD  simvastatin (ZOCOR) 20 MG tablet Take 1 tablet (20 mg total) by mouth every evening. 08/15/12   Susy Frizzle, MD  Vorapaxar Sulfate 2.08 MG TABS Take 1 tablet by mouth daily. 06/28/13   Laverda Page, MD   BP 154/72  Temp(Src) 97.6 F (36.4 C) (Oral)  Resp 14  Ht _0  (1.676 m)  Wt 191 lb (86.637 kg)  BMI 30.84 kg/m2  SpO2 99% Physical Exam  Constitutional: She appears well-developed and well-nourished. No distress.  Cardiovascular: Normal rate and regular rhythm.   Negative for chest wall tenderness   Pulmonary/Chest: Effort normal and breath sounds normal.  Abdominal: Soft. Bowel  sounds are normal.    ED Course  Procedures (including critical care time) Labs Review Labs Reviewed  CBC - Abnormal; Notable for the following:    RBC 3.33 (*)    Hemoglobin 10.5 (*)    HCT 30.7 (*)    All other components within normal limits  COMPREHENSIVE METABOLIC PANEL - Abnormal; Notable for the following:    Sodium 129 (*)    Chloride 93 (*)    Glucose, Bld 260 (*)  BUN 27 (*)    Creatinine, Ser 1.57 (*)    GFR calc non Af Amer 30 (*)    GFR calc Af Amer 35 (*)    All other components within normal limits  PROTIME-INR  HEPARIN LEVEL (UNFRACTIONATED)  I-STAT TROPOININ, ED  Randolm Idol, ED    Imaging Review Dg Chest 2 View  11/05/2013   CLINICAL DATA:  Chest pain.  EXAM: CHEST  2 VIEW  COMPARISON:  06/26/2013.  FINDINGS: Mediastinum is unremarkable. Coronary artery calcification . Mild cardiomegaly. Pulmonary venous prominence. Diffuse bilateral interstitial prominence with Kerley B-lines. Small pleural effusions. These findings are consistent with congestive heart failure and interstitial edema. Similar findings noted on prior study.  IMPRESSION: 1. Congestive heart failure with interstitial edema and small pleural effusions. Pneumonitis cannot be excluded.  2. Coronary artery calcification noted consistent with coronary artery disease .   Electronically Signed   By: Marcello Moores  Register   On: 11/05/2013 11:02     EKG Interpretation   Date/Time:  Tuesday November 05 2013 10:27:27 EDT Ventricular Rate:  93 PR Interval:  178 QRS Duration: 110 QT Interval:  408 QTC Calculation: 507 R Axis:   5 Text Interpretation:  Sinus rhythm Consider right atrial enlargement Repol  abnrm, severe global ischemia (LM/MVD) Prolonged QT interval Artifact in  lead(s) V6 and baseline wander in lead(s) V6 inferior lateral st  depression, changed from prior ecg Confirmed by CAMPOS  MD, KEVIN ([id number])  on 11/05/2013 10:44:25 AM      MDM   Final diagnoses:  None    Pt seen in ED  for ischemic inferolateral changes seen on EKG concerning for ACS. Asprin 363m prior to arrival. Started on heparin gtt and given morphine. Will admit to cardiology.   Dr. GEinar Gipsaw patient and will proceed w/ cath today. Pt made NPO and given lasix 463minjection.         DiJulious OkaMD 11/05/13 11BroctonMD 11/05/13 [phone]

## 2013-11-05 NOTE — ED Notes (Signed)
Cath lab called and are ready for the pt.

## 2013-11-05 NOTE — ED Provider Notes (Signed)
I saw and evaluated the patient, reviewed the resident's note and I agree with the findings and plan.   EKG Interpretation   Date/Time:  Tuesday November 05 2013 10:27:27 EDT Ventricular Rate:  93 PR Interval:  178 QRS Duration: 110 QT Interval:  408 QTC Calculation: 507 R Axis:   5 Text Interpretation:  Sinus rhythm Consider right atrial enlargement Repol  abnrm, severe global ischemia (LM/MVD) Prolonged QT interval Artifact in  lead(s) V6 and baseline wander in lead(s) V6 inferior lateral st  depression, changed from prior ecg Confirmed by Jerame Hedding  MD, Lennette Bihari ([id number])  on 11/05/2013 10:44:25 AM      CRITICAL CARE Performed by: Hoy Morn Total critical care time: 35 Critical care time was exclusive of separately billable procedures and treating other patients. Critical care was necessary to treat or prevent imminent or life-threatening deterioration. Critical care was time spent personally by me on the following activities: development of treatment plan with patient and/or surrogate as well as nursing, discussions with consultants, evaluation of patient's response to treatment, examination of patient, obtaining history from patient or surrogate, ordering and performing treatments and interventions, ordering and review of laboratory studies, ordering and review of radiographic studies, pulse oximetry and re-evaluation of patient's condition.   Patient with active chest pain and inferior lateral ST depression consistent with ischemia.  Cardiology consult and facial be taken urgently to the cardiac catheterization lab.  Aspirin prior to arrival.  Initiated on heparin drip.  Tabs placed on the patient and she is having ongoing discomfort.  She is managed in the emergency department until she was taken to the Cath Lab.  Dg Chest 2 View  11/05/2013   CLINICAL DATA:  Chest pain.  EXAM: CHEST  2 VIEW  COMPARISON:  06/26/2013.  FINDINGS: Mediastinum is unremarkable. Coronary artery  calcification . Mild cardiomegaly. Pulmonary venous prominence. Diffuse bilateral interstitial prominence with Kerley B-lines. Small pleural effusions. These findings are consistent with congestive heart failure and interstitial edema. Similar findings noted on prior study.  IMPRESSION: 1. Congestive heart failure with interstitial edema and small pleural effusions. Pneumonitis cannot be excluded.  2. Coronary artery calcification noted consistent with coronary artery disease .   Electronically Signed   By: Marcello Moores  Register   On: 11/05/2013 11:02   Results for orders placed during the hospital encounter of 11/05/13  MRSA PCR SCREENING      Result Value Ref Range   MRSA by PCR NEGATIVE  NEGATIVE  CBC      Result Value Ref Range   WBC 6.6  4.0 - 10.5 K/uL   RBC 3.33 (*) 3.87 - 5.11 MIL/uL   Hemoglobin 10.5 (*) 12.0 - 15.0 g/dL   HCT 30.7 (*) 36.0 - 46.0 %   MCV 92.2  78.0 - 100.0 fL   MCH 31.5  26.0 - 34.0 pg   MCHC 34.2  30.0 - 36.0 g/dL   RDW 12.8  11.5 - 15.5 %   Platelets 231  150 - 400 K/uL  PROTIME-INR      Result Value Ref Range   Prothrombin Time 14.5  11.6 - 15.2 seconds   INR 1.13  0.00 - 1.49  COMPREHENSIVE METABOLIC PANEL      Result Value Ref Range   Sodium 129 (*) 137 - 147 mEq/L   Potassium 5.0  3.7 - 5.3 mEq/L   Chloride 93 (*) 96 - 112 mEq/L   CO2 22  19 - 32 mEq/L   Glucose, Bld 260 (*) 70 -  99 mg/dL   BUN 27 (*) 6 - 23 mg/dL   Creatinine, Ser 1.57 (*) 0.50 - 1.10 mg/dL   Calcium 8.7  8.4 - 10.5 mg/dL   Total Protein 6.9  6.0 - 8.3 g/dL   Albumin 3.7  3.5 - 5.2 g/dL   AST 15  0 - 37 U/L   ALT 9  0 - 35 U/L   Alkaline Phosphatase 85  39 - 117 U/L   Total Bilirubin 0.7  0.3 - 1.2 mg/dL   GFR calc non Af Amer 30 (*) >90 mL/min   GFR calc Af Amer 35 (*) >90 mL/min   Anion gap 14  5 - 15  PLATELET INHIBITION P2Y12      Result Value Ref Range   Platelet Function  P2Y12 132 (*) 194 - 418 PRU  I-STAT TROPOININ, ED      Result Value Ref Range   Troponin i, poc 0.02   0.00 - 0.08 ng/mL   Comment 3           I-STAT TROPOININ, ED      Result Value Ref Range   Troponin i, poc 0.03  0.00 - 0.08 ng/mL   Comment 3              Hoy Morn, MD 11/05/13 1605

## 2013-11-05 NOTE — Progress Notes (Addendum)
VASCULAR LAB PRELIMINARY  PRELIMINARY  PRELIMINARY  PRELIMINARY  Pre-op Cardiac Surgery  Carotid Findings:  Bilateral:  1-39% ICA stenosis.  Vertebral artery flow is antegrade.    Ralene Cork, RVT 11/05/2013 3:47 PM      Upper Extremity Right Left  Brachial Pressures 116  Triphasic  121  Triphasic   Radial Waveforms Triphasic  Triphasic   Ulnar Waveforms Triphasic  Triphasic   Palmar Arch (Allen's Test) Doppler normal with radial compression, obliterates with ulnar compression. Within normal limits      Lower  Extremity Right Left  Dorsalis Pedis 301  Biphasic  301  Triphasic   Anterior Tibial    Posterior Tibial 144  Biphasic  260  Triphasic   Ankle/Brachial Indices 2.49 2.49    Leighton Luster, RVT 11/06/2013 1:34 PM

## 2013-11-05 NOTE — CV Procedure (Signed)
Procedure performed:  Left heart catheterization including hemodynamic monitoring of the left ventricle, selective right and left coronary arteriography.   Indication patient is a 80 year-old Caucasian female with history of hypertension, hyperlipidemia, Diabetes Mellitus (Uncontrolled) who presents with unstable angina pectoris. Patient is extremely complex with multiple coronary interventions in the past in the circumflex coronary artery which had shown recurrent in-stent restenosis. She also has had ramus intermediate stent which was previously occluded. However on her last coronary angiogram in April of 2015, the ramus intermediate branch was opened, the right coronary artery which is occluded in the midsegment was also opened. She underwent cutting balloon angioplasty to the in-stent restenotic circumflex coronary artery on 06/27/2013. She had done well until one week ago when she presents now to the emergency room with unstable angina pectoris with dynamic EKG changes. Hence is brought to the cardiac catheterization lab to evaluate his coronary anatomy.  Hemodynamic data:  Left ventricular pressure was 118/9 with LVEDP of 16 mm mercury. Aortic pressure was 115/51 with a mean of 82 mm mercury. There was no pressure gradient across the aortic valve.   Left ventricle: Not performed   Right coronary artery: severely diseased diffusely. Ostial 90% stenosis to 99% stenosis. The distal RCA fills by contralateral collaterals and also antegrade with TIMI 1 flow.    Left main coronary artery is large and normal.   Circumflex coronary artery: A large vessel giving origin small OM-1, Moderate OM-2. However the marginals and the distal circumflex coronary artery are diffusely diseased and measure approximately  2.5 mm.  The previously placed stents in the circumflex coronary artery revealed in-stent restenosis at the ostial circumflex has a high-grade 99% in-stent restenosis. The circumflex coronary artery  previously placed stents are widely patent and the OM 2, distal circumflex and AV groove circumflex had TIMI 3 flow.   LAD: LAD gives origin to several small diagonals with mild diffuse disease. LAD has mild luminal irregularity. No high-grade stenosis was evident.   Ramus intermediate: A severely diffusely diseased, and subtotally occluded at the previously placed mid ramus intermediate stent. TIMI 2 flow is evident in the ramus intermediate. This is unchanged from prior angiography in April 2015.   Recommendation: Patient has recurrent instant restenosis to the circumflex coronary artery. She now presents with unstable angina pectoris. I will have her for 2 vessel CABG including the circumflex coronary artery and right coronary artery. I discussed the findings with her son who is in also agreement. Patient will be admitted to step down unit and her cardiac markers will be followed through. A total of 30 cc of contrast was utilized for diagnostic angiography.  Technique: Under sterile precautions using a 5 French right femoral arterial access, a 5 French sheath was introduced into the right femoral artery under fluoroscopy guidance. A 5 [place] multipurpose B2 catheter was advanced into the ascending aorta and then into the left ventricle. Hemodynamics are analysed. Catheter pulled into the ascending aorta and right coronary artery was cannulated, Catheter pulled out of the body over the J-wire, a 5 [place] JL4 catheter was utilized to engage the left main coronary artery and angiography was performed in multiple views. Catheter exchanged out of the body over J-Wire. NO immediate complications noted. Patient tolerated the procedure well.

## 2013-11-05 NOTE — ED Notes (Signed)
Per EMS - pt coming from PCP office for a routine DM check up, while at the doctors office the pt mentioned that over the past 5 days the pt has intermittent chest "nagging/throbbing". Pt sts it is very slight but usually worse at night and is relieved by rest and/or Nitro. Hx of 5 stents placed. While at the doctors office she had the CP so they administered 1 Nitro and 324 mg of ASA, pt then became CP free. CBG 297. BP 142/76. HR 94. 99% on room air. EMS placed a 20 G in left hand, while en route pt remained pain free so did not administered any more medications. Pt remained alert and oriented x 4.

## 2013-11-05 NOTE — Progress Notes (Signed)
[address]       [place] [address]             [phone]        Claudia [name] [place] Medical Record #[id number] Date of Birth: [birth year]  Referring: No ref. provider found Primary Care: Odette Fraction, MD  Chief Complaint:    Chief Complaint  Patient presents with  . Chest Pain   patient examined, coronary arteriogram is reviewed with Dr. Lavone Nian , most recent 2-D echocardiogram reviewed  History of Present Illness:     80 year old Caucasian diabetic nonsmoker presents with a subendocardial MI. The patient has had multiple coronary angiograms following drug-eluting stent placement 2013 in the circumflex and ramus. The ramus stent has been totally occluded. The ostial region of the circumflex stent has had recurrent restenosis. Last PCI was April 2015. The patient presents again now with ostial restenosis of the circumflex stent. The LAD has insignificant disease. The right coronary is codominant and totally occluded, small but probably graftable. Last echo shows EF of 45-50% with moderate MR. At the time of this admission the patient also had symptoms of orthopnea and shortness of breath and chest x-ray showed interstitial pulmonary edema. Troponin was 0.6, 0.8.  The patient's blood glucose is not well controlled and usually stays above 200. She denies history stroke or peripheral vascular disease. She denies any previous surgical procedures but has had upper endoscopy showing GERD and colonoscopy which removed a tubular adenoma-benign.  Patient is on significant antiplatelet therapy for her recurrent stent stenosis-both Plavix and vorapaxar-protease activated receptor antagonist on platelets. Both agents have been stopped.  Current Activity/ Functional Status: Patient lives with her son. She is active around the house but did not plant her Garden this year. She does not drive much anymore.   Zubrod Score: At the time of surgery this patient's most  appropriate activity status/level should be described as: []     0    Normal activity, no symptoms []     1    Restricted in physical strenuous activity but ambulatory, able to do out light work [x]     2    Ambulatory and capable of self care, unable to do work activities, up and about                 more than 50%  Of the time                            []     3    Only limited self care, in bed greater than 50% of waking hours []     4    Completely disabled, no self care, confined to bed or chair []     5    Moribund  Past Medical History  Diagnosis Date  . GERD (gastroesophageal reflux disease)   . PUD (peptic ulcer disease)   . Anemia   . Hypertension   . S/P endoscopy Aug 2011    3 superficial gastric ulcers, NSAID-induced  . S/P colonoscopy Sept 2011    left-sided diverticula, tubular adenoma  . Coronary artery disease   . Shortness of breath   . Diabetes mellitus     insulin dependent  . Headache(784.0)     rare migraines  . Cancer     hx of skin cancer  . Arthritis   . Osteopenia   . Hypercholesterolemia   . SIADH (syndrome  of inappropriate ADH production)   . CHF (congestive heart failure)   . Myocardial infarction 2013    Past Surgical History  Procedure Laterality Date  . Tonsillectomy    . Appendectomy    . Eye surgery      cataracts  . Cardiac stents  07/19/2011  . Esophagogastroduodenoscopy  10/16/09    normal without barrett's/three superficial gastric ulcers  . Colonoscopy  12/04/09    normal rectum/left-sided diverticula  . Joint replacement  arthroscopy to knee  . Toe amputation Left 2013    left second toe amputation  . Cardiac catheterization  01/22/2013  . Coronary angioplasty  07/2011    History  Smoking status  . Never Smoker   Smokeless tobacco  . Never Used    History  Alcohol Use No    History   Social History  . Marital Status: Widowed    Spouse Name: N/A    Number of Children: N/A  . Years of Education: N/A   Occupational  History  . Not on file.   Social History Main Topics  . Smoking status: Never Smoker   . Smokeless tobacco: Never Used  . Alcohol Use: No  . Drug Use: No  . Sexual Activity: No   Other Topics Concern  . Not on file   Social History Narrative  . No narrative on file    Allergies  Allergen Reactions  . Aspirin Other (See Comments)    Intolerant to high doses    Current Facility-Administered Medications  Medication Dose Route Frequency Provider Last Rate Last Dose  . 0.9 %  sodium chloride infusion  250 mL Intravenous PRN Laverda Page, MD      . Derrill Memo ON 11/06/2013] 0.9 %  sodium chloride infusion   Intravenous Continuous Laverda Page, MD      . 0.9 %  sodium chloride infusion  1 mL/kg/hr Intravenous Continuous Laverda Page, MD 86.6 mL/hr at 11/05/13 1330 1 mL/kg/hr at 11/05/13 1330  . 0.9 %  sodium chloride infusion  250 mL Intravenous PRN Laverda Page, MD      . acetaminophen (TYLENOL) tablet 650 mg  650 mg Oral Q4H PRN Laverda Page, MD      . ALPRAZolam Duanne Moron) tablet 1 mg  1 mg Oral BID PRN Laverda Page, MD      . Derrill Memo ON 11/06/2013] aspirin EC tablet 81 mg  81 mg Oral Daily Laverda Page, MD      . carvedilol (COREG) tablet 6.25 mg  6.25 mg Oral BID WC Laverda Page, MD      . heparin ADULT infusion 100 units/mL (25000 units/250 mL)  1,000 Units/hr Intravenous Continuous Laverda Page, MD      . heparin bolus via infusion 4,000 Units  4,000 Units Intravenous Once Eudelia Bunch, RPH      . insulin aspart (novoLOG) injection 0-15 Units  0-15 Units Subcutaneous TID WC Laverda Page, MD      . insulin aspart (novoLOG) injection 0-5 Units  0-5 Units Subcutaneous QHS Laverda Page, MD      . insulin detemir (LEVEMIR) injection 12 Units  12 Units Subcutaneous QHS Ivin Poot, MD      . isosorbide mononitrate (IMDUR) 24 hr tablet 60 mg  60 mg Oral Daily Laverda Page, MD      . losartan (COZAAR) tablet 25 mg  25 mg Oral Daily  Laverda Page, MD      .  nitroGLYCERIN (NITROSTAT) SL tablet 0.4 mg  0.4 mg Sublingual Q5 Min x 3 PRN Laverda Page, MD      . ondansetron [facility]) injection 4 mg  4 mg Intravenous Q6H PRN Laverda Page, MD      . pantoprazole (PROTONIX) EC tablet 40 mg  40 mg Oral BID AC Laverda Page, MD      . ranolazine (RANEXA) 12 hr tablet 500 mg  500 mg Oral BID Laverda Page, MD      . simvastatin (ZOCOR) tablet 20 mg  20 mg Oral QPM Laverda Page, MD      . sodium chloride 0.9 % injection 3 mL  3 mL Intravenous Q12H Laverda Page, MD   3 mL at 11/05/13 1345  . sodium chloride 0.9 % injection 3 mL  3 mL Intravenous PRN Laverda Page, MD      . Derrill Memo ON 11/06/2013] sodium chloride 0.9 % injection 3 mL  3 mL Intravenous Q12H Laverda Page, MD      . sodium chloride 0.9 % injection 3 mL  3 mL Intravenous PRN Laverda Page, MD        Prescriptions prior to admission  Medication Sig Dispense Refill  . aspirin EC 81 MG tablet Take 81 mg by mouth daily.      . B-D ULTRAFINE III SHORT PEN 31G X 8 MM MISC USE AS DIRECTED WITH LANTUS PEN  100 each  5  . Blood Glucose Monitoring Suppl (ACCU-CHEK NANO SMARTVIEW) W/DEVICE KIT Pt needs monitor, strips (box of 400/ 4 refill), lancets (box of 400/4 refills) She checks her BS qid (before meals & QHS) Dx: 250.00  1 kit  0  . carvedilol (COREG) 6.25 MG tablet Take 6.25 mg by mouth 2 (two) times daily with a meal.       . clopidogrel (PLAVIX) 75 MG tablet Take 1 tablet (75 mg total) by mouth daily with breakfast.  30 tablet  6  . furosemide (LASIX) 40 MG tablet TAKE 1/2 TABLET BY MOUTH EVERY DAY AS NEEDED  30 tablet  3  . insulin aspart (NOVOLOG FLEXPEN) 100 UNIT/ML FlexPen Inject 7-10 Units into the skin 3 (three) times daily with meals. Inject 7 units in the morning 10 units in the afternoon and 10 units at bedtime      . insulin glargine (LANTUS SOLOSTAR) 100 UNIT/ML injection Inject 20 Units into the skin at bedtime.      .  isosorbide mononitrate (IMDUR) 60 MG 24 hr tablet Take 1 tablet (60 mg total) by mouth daily.  30 tablet  3  . losartan (COZAAR) 50 MG tablet TAKE 1/2 TABLET BY MOUTH EVERY DAY  30 tablet  3  . nitroGLYCERIN (NITROSTAT) 0.4 MG SL tablet Place 0.4 mg under the tongue every 5 (five) minutes as needed for chest pain.      Marland Kitchen omeprazole (PRILOSEC) 20 MG capsule Take 20 mg by mouth 2 (two) times daily.       . pantoprazole (PROTONIX) 40 MG tablet Take 1 tablet (40 mg total) by mouth 2 (two) times daily before a meal.  60 tablet  3  . ranolazine (RANEXA) 500 MG 12 hr tablet Take 1 tablet (500 mg total) by mouth 2 (two) times daily.  60 tablet  1  . simvastatin (ZOCOR) 20 MG tablet Take 1 tablet (20 mg total) by mouth every evening.  90 tablet  1  . Vorapaxar Sulfate 2.08 MG TABS Take  1 tablet by mouth daily.  30 tablet  1    No family history on file.   Review of Systems:   Patient has had total dental extraction with upper and lower full plates No history thoracic trauma or pneumothorax No history of smoking Bleeding from skin tears around the house and with her dogs etc. from the Plavix and anti platelet drugs    Cardiac Review of Systems: Y or N  Chest Pain [  yes  ]  Resting SOB [yes yes   ] Exertional SOB  [ yes ]  Vertell Limber [place].Blacker  ]   Pedal Edema [no  ]    Palpitations [no  ] Syncope  [no  ]   Presyncope [ no  ]  General Review of Systems: [Y] = yes [  ]=no Constitional: recent weight change [  ]; anorexia [  ]; fatigue [  ]; nausea [  ]; night sweats [  ]; fever [  ]; or chills [  ]                                                               Dental: poor dentition[  ]; Last Dentist visit: all teeth extracted after trauma  Eye : blurred vision [  ]; diplopia [   ]; vision changes [  ];  Amaurosis fugax[  ]; Resp: cough [  ];  wheezing[  ];  hemoptysis[  ]; shortness of breath[  ]; paroxysmal nocturnal dyspnea[  ]; dyspnea on exertion[  ]; or orthopnea[  ];  GI:  gallstones[  ], vomiting[   ];  dysphagia[  ]; melena[  ];  hematochezia [  ]; heartburn[  ];   Hx of  Colonoscopy[  ]; GU: kidney stones [  ]; hematuria[  ];   dysuria [  ];  nocturia[  ];  history of     obstruction [  ]; urinary frequency [  ]             Skin: rash, swelling[  ];, hair loss[  ];  peripheral edema[  ];  or itching[  ]; Musculosketetal: myalgias[  ];  joint swelling[  ];  joint erythema[  ];  joint pain[  ];  back pain[  ];  Heme/Lymph: bruising[  ];  bleeding[  ];  anemia[  ];  Neuro: TIA[  ];  headaches[  ];  stroke[  ];  vertigo[  ];  seizures[  ];   paresthesias[  ];  difficulty walking[  ];  Psych:depression[  ]; anxiety[  ];  Endocrine: diabetes[yes  ];  thyroid dysfunction[yes-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 ];  Immunizations: Flu [  ]; Pneumococcal[  ];  Other:  Physical Exam: BP 152/54  Pulse 82  Temp(Src) 97.6 F (36.4 C) (Oral)  Resp 17  Ht 5' 7"  (1.702 m)  Wt 191 lb 2.2 oz (86.7 kg)  BMI 29.93 kg/m2  SpO2 100%  Exam General appearance elderly female mildly obese alert pleasant and appropriate HEENT normocephalic pupils equal dental plates Neck no JVD mass or bruit, no adenopathy Thorax clear, no deformity or tenderness Cardiac regular rhythm without gallop, soft 2/6 systolic murmur Abdomen-obese soft nontender without pulsatile mass Extremities-warm and well-perfused, spider veins over both lower extremities, compression  dressing right groin femoral artery cath site Vascular-L1 pulses in the extremities Neuro-patient restricted to bedrest, no focal motor deficit  Diagnostic Studies & Laboratory data:     Recent Radiology Findings:   Dg Chest 2 View  11/05/2013   CLINICAL DATA:  Chest pain.  EXAM: CHEST  2 VIEW  COMPARISON:  06/26/2013.  FINDINGS: Mediastinum is unremarkable. Coronary artery calcification . Mild cardiomegaly. Pulmonary venous prominence. Diffuse bilateral interstitial prominence with Kerley B-lines. Small pleural effusions. These findings are consistent with congestive heart failure and interstitial edema. Similar findings noted on prior study.  IMPRESSION: 1. Congestive heart failure with interstitial edema and small pleural effusions. Pneumonitis cannot be excluded.  2. Coronary artery calcification noted consistent with coronary artery disease .   Electronically Signed   By: Marcello Moores  Register   On: 11/05/2013 11:02      Recent Lab Findings: Lab Results  Component Value Date   WBC 6.6 11/05/2013   HGB 10.5* 11/05/2013   HCT 30.7* 11/05/2013   PLT 231 11/05/2013   GLUCOSE 260* 11/05/2013   CHOL 125 12/03/2012   TRIG 42 12/03/2012   HDL 72 12/03/2012   LDLCALC 45 12/03/2012   ALT 9 11/05/2013   AST 15 11/05/2013   NA 129* 11/05/2013   K 5.0 11/05/2013   CL 93* 11/05/2013   CREATININE 1.57* 11/05/2013   BUN 27* 11/05/2013   CO2 22 11/05/2013   TSH 0.596 12/02/2012   INR 1.13 11/05/2013   HGBA1C 7.4* 06/27/2013      Assessment / Plan:     #1 coronary artery disease with recurrent in-stent stenosis of circumflex PCI #2 LVH, EF 50% #3 moderate mitral regurgitation by TTE #4 poorly controlled diabetes-A1c  7.4 #5 chronic anemia  Plan Plavix washout follow serial P2 Y. 12 platelet function assay CABG with grafts to circumflex and RCA with possible combined mitral valve repair late this week. Procedure and plan of care discussed in detail with both the patient and her son Arnie                    @ME1 @ 11/05/2013 3:19 PM

## 2013-11-06 ENCOUNTER — Inpatient Hospital Stay (HOSPITAL_COMMUNITY): Payer: Medicare HMO

## 2013-11-06 LAB — TROPONIN I: TROPONIN I: 0.66 ng/mL — AB (ref <0.30)

## 2013-11-06 LAB — URINALYSIS, ROUTINE W REFLEX MICROSCOPIC
Bilirubin Urine: NEGATIVE
Glucose, UA: 500 mg/dL — AB
Ketones, ur: NEGATIVE mg/dL
Nitrite: NEGATIVE
Protein, ur: NEGATIVE mg/dL
Specific Gravity, Urine: 1.016 (ref 1.005–1.030)
Urobilinogen, UA: 1 mg/dL (ref 0.0–1.0)
pH: 5.5 (ref 5.0–8.0)

## 2013-11-06 LAB — CBC
HCT: 28.5 % — ABNORMAL LOW (ref 36.0–46.0)
Hemoglobin: 9.7 g/dL — ABNORMAL LOW (ref 12.0–15.0)
MCH: 31.2 pg (ref 26.0–34.0)
MCHC: 34 g/dL (ref 30.0–36.0)
MCV: 91.6 fL (ref 78.0–100.0)
PLATELETS: 225 10*3/uL (ref 150–400)
RBC: 3.11 MIL/uL — ABNORMAL LOW (ref 3.87–5.11)
RDW: 13 % (ref 11.5–15.5)
WBC: 4.7 10*3/uL (ref 4.0–10.5)

## 2013-11-06 LAB — GLUCOSE, CAPILLARY
GLUCOSE-CAPILLARY: 191 mg/dL — AB (ref 70–99)
GLUCOSE-CAPILLARY: 151 mg/dL — AB (ref 70–99)
Glucose-Capillary: 221 mg/dL — ABNORMAL HIGH (ref 70–99)
Glucose-Capillary: 106 mg/dL — ABNORMAL HIGH (ref 70–99)

## 2013-11-06 LAB — PULMONARY FUNCTION TEST
FEF 25-75 Post: 1.74 L/sec
FEF 25-75 Pre: 1.54 L/sec
FEF2575-%Change-Post: 12 %
FEF2575-%Pred-Post: 113 %
FEF2575-%Pred-Pre: 100 %
FEV1-%Change-Post: 1 %
FEV1-%Pred-Post: 82 %
FEV1-%Pred-Pre: 81 %
FEV1-Post: 1.77 L
FEV1-Pre: 1.75 L
FEV1FVC-%Change-Post: 5 %
FEV1FVC-%Pred-Pre: 105 %
FEV6-%Change-Post: -3 %
FEV6-%Pred-Post: 79 %
FEV6-%Pred-Pre: 82 %
FEV6-Post: 2.16 L
FEV6-Pre: 2.24 L
FEV6FVC-%Change-Post: 0 %
FEV6FVC-%Pred-Post: 105 %
FEV6FVC-%Pred-Pre: 105 %
FVC-%Change-Post: -3 %
FVC-%Pred-Post: 75 %
FVC-%Pred-Pre: 78 %
FVC-Post: 2.16 L
FVC-Pre: 2.24 L
Post FEV1/FVC ratio: 82 %
Post FEV6/FVC ratio: 100 %
Pre FEV1/FVC ratio: 78 %
Pre FEV6/FVC Ratio: 100 %

## 2013-11-06 LAB — SURGICAL PCR SCREEN
MRSA, PCR: NEGATIVE
Staphylococcus aureus: NEGATIVE

## 2013-11-06 LAB — HEPARIN LEVEL (UNFRACTIONATED)
HEPARIN UNFRACTIONATED: 0.61 [IU]/mL (ref 0.30–0.70)
Heparin Unfractionated: <0.1 IU/mL — ABNORMAL LOW (ref 0.30–0.70)
Heparin Unfractionated: 0.18 IU/mL — ABNORMAL LOW (ref 0.30–0.70)

## 2013-11-06 LAB — URINE MICROSCOPIC-ADD ON

## 2013-11-06 LAB — HEMOGLOBIN A1C
Hgb A1c MFr Bld: 7.2 % — ABNORMAL HIGH (ref <5.7)
Mean Plasma Glucose: 160 mg/dL — ABNORMAL HIGH (ref <117)

## 2013-11-06 MED ORDER — ALBUTEROL SULFATE (2.5 MG/3ML) 0.083% IN NEBU
2.5000 mg | INHALATION_SOLUTION | Freq: Once | RESPIRATORY_TRACT | Status: AC
  Administered 2013-11-06: 2.5 mg via RESPIRATORY_TRACT

## 2013-11-06 MED ORDER — HEPARIN (PORCINE) IN NACL 100-0.45 UNIT/ML-% IJ SOLN
1550.0000 [IU]/h | INTRAMUSCULAR | Status: DC
  Administered 2013-11-06: 1550 [IU]/h via INTRAVENOUS
  Filled 2013-11-06 (×5): qty 250

## 2013-11-06 NOTE — Progress Notes (Addendum)
ANTICOAGULATION CONSULT NOTE - Follow Up Consult  Pharmacy Consult for heparin Indication: CAD awaiting CABG +/- MVR  Patient Measurements:  Height: 5\' 7"  (170.2 cm)  Weight: 191 lb 2.2 oz (86.7 kg)  IBW/kg (Calculated) : 61.6  Heparin Dosing Weight: 80kg  Labs:  Recent Labs  11/05/13 1040 11/06/13 0252 11/06/13 1125  HGB 10.5* 9.7*  --   HCT 30.7* 28.5*  --   PLT 231 225  --   LABPROT 14.5  --   --   INR 1.13  --   --   HEPARINUNFRC  --  <0.10* 0.18*  CREATININE 1.57*  --   --   TROPONINI  --   --  0.66*    Assessment: 80yo female s/p cath on heparin with plans for CABG and possible mitral vale repair. Heparin is below goal (HL= 0.18) on 1300 units/hr.  Goal of Therapy:  Heparin level 0.3-0.7 units/ml   Plan:  -Increase heparin to 1550 units/hr -heparin level in 8 hrs  Hildred Laser, Pharm D 11/06/2013 1:17 PM

## 2013-11-06 NOTE — Clinical Documentation Improvement (Signed)
This patient has "CHF" documented in the progress notes. If possible could you please help by providing greater specificity for the heart failure in the progress note.   Please provide CHF ACUITY: - Acute - Chronic  - Acute on Chronic  Please provide CHF TYPE: - Diastolic - Systolic - Combined (diastolic and systolic)    Supporting Information: CXR 11/05/13 IMPRESSION:  1. Congestive heart failure with interstitial edema and small pleural effusions. Pneumonitis cannot be excluded.  2. Coronary artery calcification noted consistent with coronary artery disease .  Lasix given IV on 11/05/13 (40mg )  2DEcho on 9/1: Study Conclusions - Left ventricle: The cavity size was normal. There was mild concentric hypertrophy. Systolic function was normal. The estimated ejection fraction was in the range of 55% to 60%. Hypokinesis of the basal-midinferolateral and inferior myocardium. Features are consistent with a pseudonormal left ventricular filling pattern, with concomitant abnormal relaxation and increased filling pressure (grade 2 diastolic dysfunction). Doppler parameters are consistent with both elevated ventricular end-diastolic filling pressure and elevated left atrial filling pressure. - Aortic valve: There was trivial regurgitation. - Mitral valve: Consider papillar muscle dysfunction with wall motion abnormality. Normal-sized, mildly to moderately calcified annulus. Moderately thickened, mildly calcified leaflets posterior. Mobility of the posterior leaflet was mildly restricted. There was moderate to severe regurgitation directed posteriorly. - Left atrium: The atrium was moderately dilated. - Atrial septum: The septum bowed from left to right, consistent with increased left atrial pressure.    Thank you for your time with this!   Almon Register, RN Clinical Documentation Improvement Specialist ([phone] / [phone]

## 2013-11-06 NOTE — Progress Notes (Signed)
Report called to receiving RN, Cyril Mourning.  All questions answered.  Patient to be transferred from [facility] room 24 to 2W room 37.  Patient's chart, medications, and personal belongings all sent with patient.  Patient to be transferred via wheelchair on tele monitor to new room location.  Patient called her son regarding new room location.  Doran Clay, RN

## 2013-11-06 NOTE — Clinical Documentation Improvement (Signed)
Abnormal findings (laboratory, x-ray, MRI/CT scans, and other diagnostic results) are not coded and reported unless the physician indicates their clinical significance. The medical record reflects the following clinical findings. If possible, please help by clarifying the diagnostic and/or clinical significance of these abnormal findings. Thank you!  Component      Sodium  Latest Ref Rng      137 - 147 mEq/L  11/05/2013      129 (L)   Treatment provided: -  NS IVF at 42mls/hour  Possible Clinical Conditions: - Hyponatrmia - Other condition - Not clinically significant   Thank you for your time with this!   Almon Register, RN Clinical Documentation Improvement Specialist ([phone] / [phone]

## 2013-11-06 NOTE — Progress Notes (Signed)
ANTICOAGULATION CONSULT NOTE - Follow Up Consult  Pharmacy Consult for heparin Indication: CAD awaiting CABG +/- MVR  Labs:  Recent Labs  11/05/13 1040 11/06/13 0252  HGB 10.5* 9.7*  HCT 30.7* 28.5*  PLT 231 225  LABPROT 14.5  --   INR 1.13  --   HEPARINUNFRC  --  <0.10*  CREATININE 1.57*  --     Assessment: 80yo female undetectable on heparin with initial dosing post-cath.  Goal of Therapy:  Heparin level 0.3-0.7 units/ml   Plan:  Will increase heparin gtt by 3-4 units/kg/hr to 1300 units/hr and check level in [place], PharmD, BCPS  11/06/2013,3:56 AM

## 2013-11-06 NOTE — Progress Notes (Signed)
ANTICOAGULATION CONSULT NOTE - Follow Up Consult  Pharmacy Consult for heparin Indication: CAD awaiting CABG +/- MVR  Patient Measurements:  Height: 5\' 7"  (170.2 cm)  Weight: 191 lb 2.2 oz (86.7 kg)  IBW/kg (Calculated) : 61.6  Heparin Dosing Weight: 80kg  Labs:  Recent Labs  11/05/13 1040 11/06/13 0252 11/06/13 1125 11/06/13 2127  HGB 10.5* 9.7*  --   --   HCT 30.7* 28.5*  --   --   PLT 231 225  --   --   LABPROT 14.5  --   --   --   INR 1.13  --   --   --   HEPARINUNFRC  --  <0.10* 0.18* 0.61  CREATININE 1.57*  --   --   --   TROPONINI  --   --  0.66*  --     Assessment: 80yo female s/p cath on heparin with plans for CABG and possible mitral vale repair. Heparin is at goal on 1550 units/hr  Goal of Therapy:  Heparin level 0.3-0.7 units/ml   Plan:  -Continue heparin at 1550 units/hr -Daily heparin level and CBC  Thank you for allowing pharmacy to be a part of this patients care team.  Rowe Robert Pharm.D., BCPS, AQ-Cardiology Clinical Pharmacist 11/06/2013 9:56 PM Pager: [phone] Phone: [phone]

## 2013-11-06 NOTE — Progress Notes (Signed)
Subjective:  No further chest pain. Dyspnea improved and feels well.  Objective:  Vital Signs in the last 24 hours: Temp:  [97.3 F (36.3 C)-98.2 F (36.8 C)] 97.7 F (36.5 C) (09/02 1532) Pulse Rate:  [71-82] 71 (09/02 1532) Resp:  [19] 19 (09/01 2010) BP: (114-156)/(42-79) 114/42 mmHg (09/02 1532) SpO2:  [90 %-100 %] 100 % (09/02 1532)  Intake/Output from previous day: 09/01 0701 - 09/02 0700 In: 1077.4 [I.V.:1077.4] Out: 1800 [Urine:1800]  Physical Exam:   General appearance: alert, cooperative, appears stated age and no distress Eyes: negative findings: lids and lashes normal and conjunctivae and sclerae normal Neck: no adenopathy, no carotid bruit, no JVD and supple, symmetrical, trachea midline Neck: JVP - normal, carotids 2+= without bruits Resp: Scattered crackles right base. Improved aeriation since yesterday Chest wall: no tenderness Cardio: S1, S2 normal and II/VI SEM in right parasternal border and mid systolic murmur in the apex. Distant heart sounds GI: soft, non-tender; bowel sounds normal; no masses,  no organomegaly Extremities: extremities normal, atraumatic, no cyanosis or edema    Lab Results: BMP  Recent Labs  06/27/13 0940 06/28/13 0945 11/05/13 1040  NA 131* 131* 129*  K 5.1 4.6 5.0  CL 94* 96 93*  CO2 23 23 22   GLUCOSE 196* 169* 260*  BUN 36* 31* 27*  CREATININE 1.82* 1.50* 1.57*  CALCIUM 8.9 9.0 8.7  GFRNONAA 25* 32* 30*  GFRAA 29* 37* 35*    CBC  Recent Labs Lab 11/06/13 0252  WBC 4.7  RBC 3.11*  HGB 9.7*  HCT 28.5*  PLT 225  MCV 91.6  MCH 31.2  MCHC 34.0  RDW 13.0    HEMOGLOBIN A1C Lab Results  Component Value Date   HGBA1C 7.2* 11/06/2013   MPG 160* 11/06/2013    Cardiac Panel (last 3 results)  Recent Labs  06/26/13 0539 06/26/13 1520 06/27/13 0523 11/06/13 1125  CKTOTAL 318* 561* 311*  --   CKMB 24.8* 37.1* 11.6*  --   TROPONINI 2.91* 14.02* 7.84* 0.66*  RELINDX 7.8* 6.6* 3.7*  --     BNP (last 3  results)  Recent Labs  01/22/13 1025 06/26/13 0042 06/26/13 0539  PROBNP 7728.0* 4422.0* 5499.0*    TSH  Recent Labs  12/02/12 1442  TSH 0.596    CHOLESTEROL  Recent Labs  12/03/12 0355  CHOL 125    Hepatic Function Panel  Recent Labs  12/02/12 0355 06/26/13 0029 11/05/13 1040  PROT 7.0 7.0 6.9  ALBUMIN 3.8 3.8 3.7  AST 18 21 15   ALT 11 13 9   ALKPHOS 124* 102 85  BILITOT 0.5 0.5 0.7    Imaging: Imaging results have been reviewed  Cardiac Studies:  EKG: normal sinus rhythm, normal axis, LVH.  Repolarization abnormality, cannot exclude inferior and lateral ischemia.  Compared to the EKG performed yesterday, 2.5 mm horizontal ST segment depression in the inferior and lateral leads is no longer present.  This represents an improvement in ischemic burden..   Assessment/Plan:  1.  NSTEMI, involving the lateral wall 2.  Coronary artery disease of the native vessels, history of PTCA and multiple stents to the circumflex coronary artery, ramus intermediate.  Recurrent restenosis in the circumflex stents.  Has occluded right coronary artery which is chronic, collateralized from the left. 3.  Acute on chronic diastolic heart failure and acute pulmonary edema secondary to #1 and #2, now resolved 4.  Diabetes mellitus type 2 uncontrolled, improving. 5.  Hypertension 6.  Hyperlipidemia 7.  Chronic kidney  disease stage III due to diabetes mellitus  Recommendation: Patient has been evaluated by cardiothoracic surgeon Dr. Dahlia Byes and scheduled for CABG and possible mitral valve repair.  She has moderately severe mitral regurgitation by echocardiogram and preserved LVEF.  I will transfer the patient to telemetry, otherwise continue present medical therapy including IV heparin.   Laverda Page, M.D. 11/06/2013, 5:30 PM [facility], [place] Pager: [phone] Office: [phone] If no answer: [phone]

## 2013-11-06 NOTE — Progress Notes (Signed)
CRITICAL VALUE ALERT  Critical value received:  Troponin .66  Date of notification:  11/06/13  Time of notification:  6962  Critical value read back:Yes.    Nurse who received alert:  Helene Kelp, RN  MD notified (1st page):  Dr. Einar Gip  Time of first page:  1305  MD notified (2nd page):  Time of second page:  Responding MD:  Dr. Einar Gip  Time MD responded:  1310

## 2013-11-07 ENCOUNTER — Telehealth: Payer: Self-pay | Admitting: *Deleted

## 2013-11-07 DIAGNOSIS — I2510 Atherosclerotic heart disease of native coronary artery without angina pectoris: Secondary | ICD-10-CM

## 2013-11-07 LAB — GLUCOSE, CAPILLARY
GLUCOSE-CAPILLARY: 216 mg/dL — AB (ref 70–99)
GLUCOSE-CAPILLARY: 197 mg/dL — AB (ref 70–99)
GLUCOSE-CAPILLARY: 201 mg/dL — AB (ref 70–99)
Glucose-Capillary: 184 mg/dL — ABNORMAL HIGH (ref 70–99)

## 2013-11-07 LAB — PLATELET INHIBITION P2Y12: PLATELET FUNCTION P2Y12: 228 [PRU] (ref 194–418)

## 2013-11-07 LAB — HEPARIN LEVEL (UNFRACTIONATED)
Heparin Unfractionated: 0.85 IU/mL — ABNORMAL HIGH (ref 0.30–0.70)
Heparin Unfractionated: 0.75 IU/mL — ABNORMAL HIGH (ref 0.30–0.70)

## 2013-11-07 LAB — CBC
HCT: 29.2 % — ABNORMAL LOW (ref 36.0–46.0)
Hemoglobin: 9.9 g/dL — ABNORMAL LOW (ref 12.0–15.0)
MCH: 30.8 pg (ref 26.0–34.0)
MCHC: 33.9 g/dL (ref 30.0–36.0)
MCV: 91 fL (ref 78.0–100.0)
PLATELETS: 230 10*3/uL (ref 150–400)
RBC: 3.21 MIL/uL — ABNORMAL LOW (ref 3.87–5.11)
RDW: 13 % (ref 11.5–15.5)
WBC: 6.6 10*3/uL (ref 4.0–10.5)

## 2013-11-07 LAB — PREPARE RBC (CROSSMATCH)

## 2013-11-07 LAB — ABO/RH: ABO/RH(D): O POS

## 2013-11-07 MED ORDER — SODIUM CHLORIDE 0.9 % IV SOLN
INTRAVENOUS | Status: DC
  Filled 2013-11-07: qty 30

## 2013-11-07 MED ORDER — SODIUM CHLORIDE 0.9 % IV SOLN
INTRAVENOUS | Status: DC
  Filled 2013-11-07: qty 40

## 2013-11-07 MED ORDER — SODIUM CHLORIDE 0.9 % IV SOLN
INTRAVENOUS | Status: AC
  Administered 2013-11-08: 1 [IU]/h via INTRAVENOUS
  Filled 2013-11-07: qty 2.5

## 2013-11-07 MED ORDER — CHLORHEXIDINE GLUCONATE 4 % EX LIQD
60.0000 mL | Freq: Once | CUTANEOUS | Status: AC
  Administered 2013-11-07: 4 via TOPICAL
  Filled 2013-11-07: qty 60

## 2013-11-07 MED ORDER — HEPARIN (PORCINE) IN NACL 100-0.45 UNIT/ML-% IJ SOLN
1400.0000 [IU]/h | INTRAMUSCULAR | Status: DC
  Administered 2013-11-07: 1400 [IU]/h via INTRAVENOUS
  Filled 2013-11-07: qty 250

## 2013-11-07 MED ORDER — HEPARIN (PORCINE) IN NACL 100-0.45 UNIT/ML-% IJ SOLN: 1250.0000 [IU]/h | INTRAMUSCULAR | Status: DC

## 2013-11-07 MED ORDER — INSULIN GLARGINE 100 UNIT/ML ~~LOC~~ SOLN
14.0000 [IU] | Freq: Every day | SUBCUTANEOUS | Status: AC
  Administered 2013-11-07: 14 [IU] via SUBCUTANEOUS
  Filled 2013-11-07: qty 0.14

## 2013-11-07 MED ORDER — POTASSIUM CHLORIDE 2 MEQ/ML IV SOLN
80.0000 meq | INTRAVENOUS | Status: DC
  Filled 2013-11-07: qty 40

## 2013-11-07 MED ORDER — PLASMA-LYTE 148 IV SOLN
INTRAVENOUS | Status: AC
  Administered 2013-11-08: 09:00:00
  Filled 2013-11-07: qty 2.5

## 2013-11-07 MED ORDER — CHLORHEXIDINE GLUCONATE 4 % EX LIQD
60.0000 mL | Freq: Once | CUTANEOUS | Status: AC
  Administered 2013-11-08: 4 via TOPICAL
  Filled 2013-11-07 (×2): qty 60

## 2013-11-07 MED ORDER — DEXTROSE 5 % IV SOLN
750.0000 mg | INTRAVENOUS | Status: DC
  Filled 2013-11-07: qty 750

## 2013-11-07 MED ORDER — DEXMEDETOMIDINE HCL IN NACL 400 MCG/100ML IV SOLN
0.1000 ug/kg/h | INTRAVENOUS | Status: AC
  Administered 2013-11-08: 0.3 ug/kg/h via INTRAVENOUS
  Filled 2013-11-07: qty 100

## 2013-11-07 MED ORDER — METOPROLOL TARTRATE 12.5 MG HALF TABLET
12.5000 mg | ORAL_TABLET | Freq: Once | ORAL | Status: AC
  Administered 2013-11-08: 12.5 mg via ORAL
  Filled 2013-11-07: qty 1

## 2013-11-07 MED ORDER — PHENYLEPHRINE HCL 10 MG/ML IJ SOLN
30.0000 ug/min | INTRAVENOUS | Status: DC
  Filled 2013-11-07: qty 2

## 2013-11-07 MED ORDER — BISACODYL 5 MG PO TBEC
5.0000 mg | DELAYED_RELEASE_TABLET | Freq: Once | ORAL | Status: AC
Start: 2013-11-07 — End: 2013-11-07
  Administered 2013-11-07: 5 mg via ORAL
  Filled 2013-11-07: qty 1

## 2013-11-07 MED ORDER — DOPAMINE-DEXTROSE 3.2-5 MG/ML-% IV SOLN
2.0000 ug/kg/min | INTRAVENOUS | Status: DC
  Filled 2013-11-07: qty 250

## 2013-11-07 MED ORDER — DEXTROSE 5 % IV SOLN
1.5000 g | INTRAVENOUS | Status: AC
  Administered 2013-11-08: .75 g via INTRAVENOUS
  Administered 2013-11-08: 1.5 g via INTRAVENOUS
  Filled 2013-11-07: qty 1.5

## 2013-11-07 MED ORDER — VANCOMYCIN HCL 10 G IV SOLR
1250.0000 mg | INTRAVENOUS | Status: AC
  Administered 2013-11-08: 1250 mg via INTRAVENOUS
  Filled 2013-11-07: qty 1250

## 2013-11-07 MED ORDER — NITROGLYCERIN IN D5W 200-5 MCG/ML-% IV SOLN
2.0000 ug/min | INTRAVENOUS | Status: AC
  Administered 2013-11-08: 5 ug/min via INTRAVENOUS
  Filled 2013-11-07: qty 250

## 2013-11-07 MED ORDER — TEMAZEPAM 15 MG PO CAPS: 15.0000 mg | ORAL_CAPSULE | Freq: Once | ORAL | Status: AC | PRN

## 2013-11-07 MED ORDER — EPINEPHRINE HCL 1 MG/ML IJ SOLN
0.5000 ug/min | INTRAVENOUS | Status: DC
  Filled 2013-11-07: qty 4

## 2013-11-07 MED ORDER — MAGNESIUM SULFATE 50 % IJ SOLN
40.0000 meq | INTRAMUSCULAR | Status: DC
Start: 2013-11-08 — End: 2013-11-08
  Filled 2013-11-07: qty 10

## 2013-11-07 MED ORDER — DIAZEPAM 2 MG PO TABS
2.0000 mg | ORAL_TABLET | Freq: Once | ORAL | Status: AC
  Administered 2013-11-08: 2 mg via ORAL
  Filled 2013-11-07: qty 1

## 2013-11-07 NOTE — Progress Notes (Signed)
ANTICOAGULATION CONSULT NOTE - Follow Up Consult   HL = 0.75 (goal 0.3 - 0.7 units/mL) Heparin dosing weight = 80 kg   Assessment: 60 YOF with CAD awaiting CABG +/- MVR to continue on IV heparin.  Heparin level is supra-therapeutic but has trended down some.  No bleeding reported.   Plan: - Decrease heparin gtt to 1250 units/hr - F/U AM labs - CABG in AM    Claudia [name] D. Mina Marble, PharmD, BCPS Pager:  [phone] 11/07/2013, 10:19 PM

## 2013-11-07 NOTE — Progress Notes (Signed)
Patient is currently active with [facility] for chronic disease management services.  Patient has been engaged by a [employer].  Our community based plan of care has focused on disease management of CAD, Diabetes, and history of recurrent falls.  Patient is adherent and manages her care well at home.  Lives with her son who assists her with care.  Patient will receive a post discharge transition of care call and will be evaluated for monthly home visits for assessments and disease process education.  Made Inpatient Case Manager aware that [facility] following. Of note, [facility] Care Management services does not replace or interfere with any services that are arranged by inpatient case management or social work.  For additional questions or referrals please contact Corliss Blacker BSN RN [facility]iaison at [phone].

## 2013-11-07 NOTE — Telephone Encounter (Signed)
Received fax thru [facility] stating that pt has been admitted to acute care hospital with DX: 786.50 chest pain, admitted by Dr. Kela Millin,  MD pending authorization # [id number]

## 2013-11-07 NOTE — Progress Notes (Signed)
ANTICOAGULATION CONSULT NOTE - Follow Up Consult  Pharmacy Consult for heparin Indication: CAD awaiting CABG +/- MVR  Patient Measurements:  Height: 5\' 7"  (170.2 cm)  Weight: 191 lb 2.2 oz (86.7 kg)  IBW/kg (Calculated) : 61.6  Heparin Dosing Weight: 80kg  Labs:  Recent Labs  11/05/13 1040  11/06/13 0252 11/06/13 1125 11/06/13 2127 11/07/13 0910  HGB 10.5*  --  9.7*  --   --  9.9*  HCT 30.7*  --  28.5*  --   --  29.2*  PLT 231  --  225  --   --  230  LABPROT 14.5  --   --   --   --   --   INR 1.13  --   --   --   --   --   HEPARINUNFRC  --   < > <0.10* 0.18* 0.61 0.85*  CREATININE 1.57*  --   --   --   --   --   TROPONINI  --   --   --  0.66*  --   --   < > = values in this interval not displayed.  Assessment: 80yo female s/p cath on heparin with plans for CABG and possible mitral vale repair. Heparin level is above goal on 1550 units/hr.  No bleeding or complications noted, and CBC fairly stable.  Goal of Therapy:  Heparin level 0.3-0.7 units/ml   Plan:  -Decrease IV heparin to 1400 units/hr. -Recheck heparin level in 8 hrs. -Continue daily heparin level and CBC. -Awaiting CABG.  Uvaldo Rising, BCPS  Clinical Pharmacist Pager [phone]  11/07/2013 11:50 AM

## 2013-11-07 NOTE — Progress Notes (Signed)
Subjective:  Patient had an episode of angina pectoris last night, requiring 2 sublingual nitroglycerin with complete resolution of chest pain and associated shortness of breath and cough.  This morning feels well.  No specific complaints.  Objective:  Vital Signs in the last 24 hours: Temp:  [97.7 F (36.5 C)-98.9 F (37.2 C)] 98.9 F (37.2 C) (09/03 0416) Pulse Rate:  [70-89] 89 (09/03 0416) Resp:  [17-18] 18 (09/03 0416) BP: (114-121)/(42-52) 121/52 mmHg (09/03 0416) SpO2:  [96 %-100 %] 96 % (09/03 0416)  Intake/Output from previous day: 09/02 0701 - 09/03 0700 In: 1007.3 [P.O.:720; I.V.:287.3] Out: 1250 [Urine:1250]  Physical Exam:   General appearance: alert, cooperative, appears stated age and no distress Eyes: negative findings: lids and lashes normal and conjunctivae and sclerae normal Neck: no adenopathy, no carotid bruit, no JVD and supple, symmetrical, trachea midline Neck: JVP - normal, carotids 2+= without bruits Resp: clear to auscultation bilaterally Chest wall: no tenderness Cardio: regular rate and rhythm, S1, S2 normal, no murmur, click, rub or gallop and II/VI SEM in right parasternal border and mid systolic murmur in the apex. Distant heart sounds GI: soft, non-tender; bowel sounds normal; no masses,  no organomegaly Extremities: extremities normal, atraumatic, no cyanosis or edema    Lab Results: BMP  Recent Labs  06/27/13 0940 06/28/13 0945 11/05/13 1040  NA 131* 131* 129*  K 5.1 4.6 5.0  CL 94* 96 93*  CO2 23 23 22   GLUCOSE 196* 169* 260*  BUN 36* 31* 27*  CREATININE 1.82* 1.50* 1.57*  CALCIUM 8.9 9.0 8.7  GFRNONAA 25* 32* 30*  GFRAA 29* 37* 35*    CBC  Recent Labs Lab 11/07/13 0910  WBC 6.6  RBC 3.21*  HGB 9.9*  HCT 29.2*  PLT 230  MCV 91.0  MCH 30.8  MCHC 33.9  RDW 13.0    HEMOGLOBIN A1C Lab Results  Component Value Date   HGBA1C 7.2* 11/06/2013   MPG 160* 11/06/2013    Cardiac Panel (last 3 results)  Recent Labs   06/26/13 0539 06/26/13 1520 06/27/13 0523 11/06/13 1125  CKTOTAL 318* 561* 311*  --   CKMB 24.8* 37.1* 11.6*  --   TROPONINI 2.91* 14.02* 7.84* 0.66*  RELINDX 7.8* 6.6* 3.7*  --     BNP (last 3 results)  Recent Labs  01/22/13 1025 06/26/13 0042 06/26/13 0539  PROBNP 7728.0* 4422.0* 5499.0*    TSH  Recent Labs  12/02/12 1442  TSH 0.596    CHOLESTEROL  Recent Labs  12/03/12 0355  CHOL 125    Hepatic Function Panel  Recent Labs  12/02/12 0355 06/26/13 0029 11/05/13 1040  PROT 7.0 7.0 6.9  ALBUMIN 3.8 3.8 3.7  AST 18 21 15   ALT 11 13 9   ALKPHOS 124* 102 85  BILITOT 0.5 0.5 0.7    Imaging: Imaging results have been reviewed  Cardiac Studies:  EKG: normal sinus rhythm, normal axis, LVH.  Repolarization abnormality, cannot exclude inferior and lateral ischemia.  Compared to the EKG performed yesterday, 2.5 mm horizontal ST segment depression in the inferior and lateral leads is no longer present.  This represents an improvement in ischemic burden..   Assessment/Plan:  1.  NSTEMI, involving the lateral wall 2.  Coronary artery disease of the native vessels, history of PTCA and multiple stents to the circumflex coronary artery, ramus intermediate.  Recurrent restenosis in the circumflex stents.  Has occluded right coronary artery which is chronic, collateralized from the left. For CABG in am.  3.  Acute on chronic diastolic heart failure and acute pulmonary edema secondary to #1 and #2, now resolved 4.  Diabetes mellitus type 2 uncontrolled, improving. 5.  Hypertension 6.  Hyperlipidemia 7.  Chronic kidney disease stage III due to diabetes mellitus  Recommendation: Patient has been evaluated by cardiothoracic surgeon Dr. Dahlia Byes and scheduled for CABG and possible mitral valve repair which I suspect she will need.  She has moderately severe mitral regurgitation by echocardiogram and preserved LVEF.  No change in therapy.   Laverda Page,  M.D. 11/07/2013, 10:07 AM [facility] Pager: [phone] Office: [phone] If no answer: [phone]

## 2013-11-07 NOTE — Progress Notes (Signed)
2 Days Post-Op Procedure(s) (LRB): LEFT HEART CATHETERIZATION WITH CORONARY ANGIOGRAM (N/A) Subjective: Severe recurrent CAD, unstable angina with subendocardial MI from in-stent restenosis of circumflex PCI. Patient had episode of chest pain last requiring nitroglycerin. No chest pain today Plavix on hold-P2 Y. 12 assay improved now up to 225 Echocardiogram reviewed with Dr. Lavone Nian appears to have moderate to severe ischemic MR will probably need ring annuloplasty with combined CABG-this was explained to patient.  Objective: Vital signs in last 24 hours: Temp:  [97.7 F (36.5 C)-98.9 F (37.2 C)] 97.9 F (36.6 C) (09/03 1323) Pulse Rate:  [70-89] 85 (09/03 1323) Cardiac Rhythm:  [-] Normal sinus rhythm (09/03 0804) Resp:  [17-18] 18 (09/03 1323) BP: (98-121)/(42-52) 98/49 mmHg (09/03 1323) SpO2:  [96 %-100 %] 98 % (09/03 1323)  Hemodynamic parameters for last 24 hours:   stable sinus rhythm  Intake/Output from previous day: 09/02 0701 - 09/03 0700 In: 1007.3 [P.O.:720; I.V.:287.3] Out: 1250 [Urine:1250] Intake/Output this shift: Total I/O In: 480 [P.O.:480] Out: 250 [Urine:250]  Alert and comfortable Lungs clear Minimal edema No murmur appreciated  Lab Results:  Recent Labs  11/06/13 0252 11/07/13 0910  WBC 4.7 6.6  HGB 9.7* 9.9*  HCT 28.5* 29.2*  PLT 225 230   BMET:  Recent Labs  11/05/13 1040  NA 129*  K 5.0  CL 93*  CO2 22  GLUCOSE 260*  BUN 27*  CREATININE 1.57*  CALCIUM 8.7    PT/INR:  Recent Labs  11/05/13 1040  LABPROT 14.5  INR 1.13   ABG No results found for this basename: phart, pco2, po2, hco3, tco2, acidbasedef, o2sat   CBG (last 3)   Recent Labs  11/06/13 2112 11/07/13 0744 11/07/13 1125  GLUCAP 106* 184* 216*    Assessment/Plan: S/P Procedure(s) (LRB): LEFT HEART CATHETERIZATION WITH CORONARY ANGIOGRAM (N/A) Severe CAD, moderate severe ischemic MR Plan CABG with possible mitral repair pending results of intraoperative  TEE Patient has preoperative anemia and understandsw she is probably going to need blood transfusion therapy during after surgery.   LOS: 2 days    VAN TRIGT III,Araina Butrick 11/07/2013

## 2013-11-07 NOTE — Progress Notes (Signed)
[id number] Did not walk with pt as she had CP this morning and for surgery tomorrow. Reviewed importance of mobility and IS after surgery. Discussed sternal precautions with pt who voiced understanding. Pt stated her son and possibly daughter-in-law to stay with her after discharge for 24/7. Gave [facility] care guide and pt has [facility] booklet. Has viewed pre op video already. Will follow up after surgery Can get 1000 ml on IS.Marland Kitchen Graylon Good RN BSN 11/07/2013 11:47 AM

## 2013-11-08 ENCOUNTER — Inpatient Hospital Stay (HOSPITAL_COMMUNITY): Payer: Medicare HMO

## 2013-11-08 ENCOUNTER — Inpatient Hospital Stay (HOSPITAL_COMMUNITY): Payer: Medicare HMO | Admitting: Certified Registered"

## 2013-11-08 ENCOUNTER — Encounter (HOSPITAL_COMMUNITY)
Admission: EM | Disposition: A | Discharge status: Skilled Nursing Facility | Payer: Commercial Managed Care - HMO | Source: Home / Self Care | Attending: Cardiothoracic Surgery

## 2013-11-08 ENCOUNTER — Encounter (HOSPITAL_COMMUNITY): Payer: Medicare HMO | Admitting: Certified Registered"

## 2013-11-08 ENCOUNTER — Encounter (HOSPITAL_COMMUNITY): Payer: Self-pay | Admitting: Anesthesiology

## 2013-11-08 DIAGNOSIS — Z951 Presence of aortocoronary bypass graft: Secondary | ICD-10-CM

## 2013-11-08 DIAGNOSIS — I059 Rheumatic mitral valve disease, unspecified: Secondary | ICD-10-CM

## 2013-11-08 HISTORY — PX: INTRAOPERATIVE TRANSESOPHAGEAL ECHOCARDIOGRAM: SHX5062

## 2013-11-08 HISTORY — PX: MITRAL VALVE REPLACEMENT: SHX147

## 2013-11-08 HISTORY — PX: CORONARY ARTERY BYPASS GRAFT: SHX141

## 2013-11-08 LAB — POCT I-STAT 3, ART BLOOD GAS (G3+)
ACID-BASE DEFICIT: 1 mmol/L (ref 0.0–2.0)
ACID-BASE DEFICIT: 3 mmol/L — AB (ref 0.0–2.0)
Acid-base deficit: 3 mmol/L — ABNORMAL HIGH (ref 0.0–2.0)
Acid-base deficit: 2 mmol/L (ref 0.0–2.0)
Acid-base deficit: 6 mmol/L — ABNORMAL HIGH (ref 0.0–2.0)
BICARBONATE: 22.7 meq/L (ref 20.0–24.0)
BICARBONATE: 24.7 meq/L — AB (ref 20.0–24.0)
BICARBONATE: 22.2 meq/L (ref 20.0–24.0)
BICARBONATE: 22.7 meq/L (ref 20.0–24.0)
Bicarbonate: 23.2 mEq/L (ref 20.0–24.0)
O2 SAT: 100 %
O2 Saturation: 100 %
O2 Saturation: 100 %
O2 Saturation: 93 %
O2 Saturation: 100 %
PCO2 ART: 46.1 mmHg — AB (ref 35.0–45.0)
PH ART: 7.31 — AB (ref 7.350–7.450)
PH ART: 7.235 — AB (ref 7.350–7.450)
PO2 ART: 377 mmHg — AB (ref 80.0–100.0)
PO2 ART: 276 mmHg — AB (ref 80.0–100.0)
Patient temperature: 37.2
TCO2: 25 mmol/L (ref 0–100)
TCO2: 24 mmol/L (ref 0–100)
TCO2: 26 mmol/L (ref 0–100)
TCO2: 24 mmol/L (ref 0–100)
TCO2: 24 mmol/L (ref 0–100)
pCO2 arterial: 39.1 mmHg (ref 35.0–45.0)
pCO2 arterial: 46.4 mmHg — ABNORMAL HIGH (ref 35.0–45.0)
pCO2 arterial: 51.3 mmHg — ABNORMAL HIGH (ref 35.0–45.0)
pCO2 arterial: 40.2 mmHg (ref 35.0–45.0)
pH, Arterial: 7.371 (ref 7.350–7.450)
pH, Arterial: 7.334 — ABNORMAL LOW (ref 7.350–7.450)
pH, Arterial: 7.36 (ref 7.350–7.450)
pO2, Arterial: 449 mmHg — ABNORMAL HIGH (ref 80.0–100.0)
pO2, Arterial: 75 mmHg — ABNORMAL LOW (ref 80.0–100.0)
pO2, Arterial: 290 mmHg — ABNORMAL HIGH (ref 80.0–100.0)

## 2013-11-08 LAB — PROTIME-INR
INR: 2.02 — ABNORMAL HIGH (ref 0.00–1.49)
PROTHROMBIN TIME: 22.9 s — AB (ref 11.6–15.2)

## 2013-11-08 LAB — CBC
HCT: 33.8 % — ABNORMAL LOW (ref 36.0–46.0)
HCT: 29.9 % — ABNORMAL LOW (ref 36.0–46.0)
HEMATOCRIT: 27.1 % — AB (ref 36.0–46.0)
Hemoglobin: 9.5 g/dL — ABNORMAL LOW (ref 12.0–15.0)
Hemoglobin: 11.4 g/dL — ABNORMAL LOW (ref 12.0–15.0)
Hemoglobin: 10.6 g/dL — ABNORMAL LOW (ref 12.0–15.0)
MCH: 31.4 pg (ref 26.0–34.0)
MCH: 30 pg (ref 26.0–34.0)
MCH: 30.1 pg (ref 26.0–34.0)
MCHC: 35.1 g/dL (ref 30.0–36.0)
MCHC: 33.7 g/dL (ref 30.0–36.0)
MCHC: 35.5 g/dL (ref 30.0–36.0)
MCV: 89.4 fL (ref 78.0–100.0)
MCV: 88.9 fL (ref 78.0–100.0)
MCV: 84.9 fL (ref 78.0–100.0)
Platelets: 217 10*3/uL (ref 150–400)
Platelets: 95 10*3/uL — ABNORMAL LOW (ref 150–400)
Platelets: 87 10*3/uL — ABNORMAL LOW (ref 150–400)
RBC: 3.03 MIL/uL — AB (ref 3.87–5.11)
RBC: 3.8 MIL/uL — AB (ref 3.87–5.11)
RBC: 3.52 MIL/uL — ABNORMAL LOW (ref 3.87–5.11)
RDW: 12.8 % (ref 11.5–15.5)
RDW: 14.8 % (ref 11.5–15.5)
RDW: 14.9 % (ref 11.5–15.5)
WBC: 6.5 10*3/uL (ref 4.0–10.5)
WBC: 5.6 10*3/uL (ref 4.0–10.5)
WBC: 10.3 10*3/uL (ref 4.0–10.5)

## 2013-11-08 LAB — POCT I-STAT 4, (NA,K, GLUC, HGB,HCT)
GLUCOSE: 160 mg/dL — AB (ref 70–99)
Glucose, Bld: 140 mg/dL — ABNORMAL HIGH (ref 70–99)
HCT: 32 % — ABNORMAL LOW (ref 36.0–46.0)
HCT: 30 % — ABNORMAL LOW (ref 36.0–46.0)
HEMOGLOBIN: 10.2 g/dL — AB (ref 12.0–15.0)
Hemoglobin: 10.9 g/dL — ABNORMAL LOW (ref 12.0–15.0)
POTASSIUM: 3.9 meq/L (ref 3.7–5.3)
Potassium: 4.7 mEq/L (ref 3.7–5.3)
Sodium: 132 mEq/L — ABNORMAL LOW (ref 137–147)
Sodium: 133 mEq/L — ABNORMAL LOW (ref 137–147)

## 2013-11-08 LAB — POCT I-STAT, CHEM 8
BUN: 27 mg/dL — AB (ref 6–23)
BUN: 22 mg/dL (ref 6–23)
BUN: 27 mg/dL — AB (ref 6–23)
BUN: 25 mg/dL — ABNORMAL HIGH (ref 6–23)
BUN: 27 mg/dL — AB (ref 6–23)
BUN: 24 mg/dL — AB (ref 6–23)
BUN: 23 mg/dL (ref 6–23)
BUN: 25 mg/dL — AB (ref 6–23)
CALCIUM ION: 1.18 mmol/L (ref 1.13–1.30)
CALCIUM ION: 1.27 mmol/L (ref 1.13–1.30)
CHLORIDE: 96 meq/L (ref 96–112)
CHLORIDE: 97 meq/L (ref 96–112)
CHLORIDE: 100 meq/L (ref 96–112)
CHLORIDE: 107 meq/L (ref 96–112)
CREATININE: 1.2 mg/dL — AB (ref 0.50–1.10)
CREATININE: 1.5 mg/dL — AB (ref 0.50–1.10)
CREATININE: 1.4 mg/dL — AB (ref 0.50–1.10)
CREATININE: 1.4 mg/dL — AB (ref 0.50–1.10)
CREATININE: 1.5 mg/dL — AB (ref 0.50–1.10)
Calcium, Ion: 1.26 mmol/L (ref 1.13–1.30)
Calcium, Ion: 0.88 mmol/L — ABNORMAL LOW (ref 1.13–1.30)
Calcium, Ion: 1.24 mmol/L (ref 1.13–1.30)
Calcium, Ion: 1.09 mmol/L — ABNORMAL LOW (ref 1.13–1.30)
Calcium, Ion: 1.03 mmol/L — ABNORMAL LOW (ref 1.13–1.30)
Calcium, Ion: 1.27 mmol/L (ref 1.13–1.30)
Chloride: 98 mEq/L (ref 96–112)
Chloride: 98 mEq/L (ref 96–112)
Chloride: 99 mEq/L (ref 96–112)
Chloride: 103 mEq/L (ref 96–112)
Creatinine, Ser: 1.6 mg/dL — ABNORMAL HIGH (ref 0.50–1.10)
Creatinine, Ser: 1.6 mg/dL — ABNORMAL HIGH (ref 0.50–1.10)
Creatinine, Ser: 1.5 mg/dL — ABNORMAL HIGH (ref 0.50–1.10)
GLUCOSE: 159 mg/dL — AB (ref 70–99)
GLUCOSE: 140 mg/dL — AB (ref 70–99)
Glucose, Bld: 165 mg/dL — ABNORMAL HIGH (ref 70–99)
Glucose, Bld: 167 mg/dL — ABNORMAL HIGH (ref 70–99)
Glucose, Bld: 121 mg/dL — ABNORMAL HIGH (ref 70–99)
Glucose, Bld: 89 mg/dL (ref 70–99)
Glucose, Bld: 78 mg/dL (ref 70–99)
Glucose, Bld: 198 mg/dL — ABNORMAL HIGH (ref 70–99)
HCT: 25 % — ABNORMAL LOW (ref 36.0–46.0)
HCT: 22 % — ABNORMAL LOW (ref 36.0–46.0)
HCT: 22 % — ABNORMAL LOW (ref 36.0–46.0)
HCT: 23 % — ABNORMAL LOW (ref 36.0–46.0)
HCT: 24 % — ABNORMAL LOW (ref 36.0–46.0)
HCT: 24 % — ABNORMAL LOW (ref 36.0–46.0)
HEMATOCRIT: 18 % — AB (ref 36.0–46.0)
HEMATOCRIT: 29 % — AB (ref 36.0–46.0)
HEMOGLOBIN: 8.5 g/dL — AB (ref 12.0–15.0)
HEMOGLOBIN: 6.1 g/dL — AB (ref 12.0–15.0)
HEMOGLOBIN: 8.2 g/dL — AB (ref 12.0–15.0)
Hemoglobin: 7.5 g/dL — ABNORMAL LOW (ref 12.0–15.0)
Hemoglobin: 7.5 g/dL — ABNORMAL LOW (ref 12.0–15.0)
Hemoglobin: 7.8 g/dL — ABNORMAL LOW (ref 12.0–15.0)
Hemoglobin: 8.2 g/dL — ABNORMAL LOW (ref 12.0–15.0)
Hemoglobin: 9.9 g/dL — ABNORMAL LOW (ref 12.0–15.0)
POTASSIUM: 4.7 meq/L (ref 3.7–5.3)
POTASSIUM: 4.6 meq/L (ref 3.7–5.3)
POTASSIUM: 5.1 meq/L (ref 3.7–5.3)
POTASSIUM: 4.6 meq/L (ref 3.7–5.3)
POTASSIUM: 4.5 meq/L (ref 3.7–5.3)
Potassium: 4.8 mEq/L (ref 3.7–5.3)
Potassium: 4.5 mEq/L (ref 3.7–5.3)
Potassium: 4.7 mEq/L (ref 3.7–5.3)
SODIUM: 128 meq/L — AB (ref 137–147)
SODIUM: 126 meq/L — AB (ref 137–147)
Sodium: 126 mEq/L — ABNORMAL LOW (ref 137–147)
Sodium: 130 mEq/L — ABNORMAL LOW (ref 137–147)
Sodium: 128 mEq/L — ABNORMAL LOW (ref 137–147)
Sodium: 130 mEq/L — ABNORMAL LOW (ref 137–147)
Sodium: 129 mEq/L — ABNORMAL LOW (ref 137–147)
Sodium: 131 mEq/L — ABNORMAL LOW (ref 137–147)
TCO2: 23 mmol/L (ref 0–100)
TCO2: 22 mmol/L (ref 0–100)
TCO2: 22 mmol/L (ref 0–100)
TCO2: 25 mmol/L (ref 0–100)
TCO2: 24 mmol/L (ref 0–100)
TCO2: 24 mmol/L (ref 0–100)
TCO2: 24 mmol/L (ref 0–100)
TCO2: 25 mmol/L (ref 0–100)

## 2013-11-08 LAB — BASIC METABOLIC PANEL
Anion gap: 12 (ref 5–15)
BUN: 30 mg/dL — ABNORMAL HIGH (ref 6–23)
CO2: 22 mEq/L (ref 19–32)
Calcium: 8.7 mg/dL (ref 8.4–10.5)
Chloride: 93 mEq/L — ABNORMAL LOW (ref 96–112)
Creatinine, Ser: 1.65 mg/dL — ABNORMAL HIGH (ref 0.50–1.10)
GFR calc Af Amer: 33 mL/min — ABNORMAL LOW (ref >90)
GFR calc non Af Amer: 28 mL/min — ABNORMAL LOW (ref >90)
Glucose, Bld: 143 mg/dL — ABNORMAL HIGH (ref 70–99)
Potassium: 4.9 mEq/L (ref 3.7–5.3)
Sodium: 127 mEq/L — ABNORMAL LOW (ref 137–147)

## 2013-11-08 LAB — HEPARIN LEVEL (UNFRACTIONATED): HEPARIN UNFRACTIONATED: 0.6 [IU]/mL (ref 0.30–0.70)

## 2013-11-08 LAB — POCT I-STAT 3, VENOUS BLOOD GAS (G3P V)
ACID-BASE DEFICIT: 2 mmol/L (ref 0.0–2.0)
Bicarbonate: 24 mEq/L (ref 20.0–24.0)
O2 Saturation: 79 %
PO2 VEN: 48 mmHg — AB (ref 30.0–45.0)
TCO2: 25 mmol/L (ref 0–100)
pCO2, Ven: 48.5 mmHg (ref 45.0–50.0)
pH, Ven: 7.302 — ABNORMAL HIGH (ref 7.250–7.300)

## 2013-11-08 LAB — CREATININE, SERUM
Creatinine, Ser: 1.54 mg/dL — ABNORMAL HIGH (ref 0.50–1.10)
GFR calc Af Amer: 36 mL/min — ABNORMAL LOW (ref >90)
GFR calc non Af Amer: 31 mL/min — ABNORMAL LOW (ref >90)

## 2013-11-08 LAB — GLUCOSE, CAPILLARY: GLUCOSE-CAPILLARY: 151 mg/dL — AB (ref 70–99)

## 2013-11-08 LAB — HEMOGLOBIN AND HEMATOCRIT, BLOOD
HCT: 25.4 % — ABNORMAL LOW (ref 36.0–46.0)
Hemoglobin: 9.1 g/dL — ABNORMAL LOW (ref 12.0–15.0)

## 2013-11-08 LAB — FIBRINOGEN: Fibrinogen: 238 mg/dL (ref 204–475)

## 2013-11-08 LAB — PREPARE RBC (CROSSMATCH)

## 2013-11-08 LAB — PLATELET INHIBITION P2Y12: Platelet Function  P2Y12: 307 [PRU] (ref 194–418)

## 2013-11-08 LAB — MAGNESIUM: Magnesium: 2.8 mg/dL — ABNORMAL HIGH (ref 1.5–2.5)

## 2013-11-08 LAB — APTT: aPTT: 64 seconds — ABNORMAL HIGH (ref 24–37)

## 2013-11-08 LAB — PLATELET COUNT: Platelets: 113 10*3/uL — ABNORMAL LOW (ref 150–400)

## 2013-11-08 SURGERY — CORONARY ARTERY BYPASS GRAFTING (CABG)
Anesthesia: General | Site: Chest

## 2013-11-08 MED ORDER — ACETAMINOPHEN 500 MG PO TABS
1000.0000 mg | ORAL_TABLET | Freq: Four times a day (QID) | ORAL | Status: AC
  Administered 2013-11-09 – 2013-11-13 (×9): 1000 mg via ORAL
  Filled 2013-11-08 (×18): qty 2

## 2013-11-08 MED ORDER — BISACODYL 10 MG RE SUPP
10.0000 mg | Freq: Every day | RECTAL | Status: DC
  Administered 2013-11-09: 10 mg via RECTAL
  Filled 2013-11-08: qty 1

## 2013-11-08 MED ORDER — PHENYLEPHRINE HCL 10 MG/ML IJ SOLN
10.0000 mg | INTRAVENOUS | Status: DC | PRN
  Administered 2013-11-08: 30 ug/min via INTRAVENOUS
  Administered 2013-11-08: 10:00:00 via INTRAVENOUS

## 2013-11-08 MED ORDER — SODIUM CHLORIDE 0.9 % IJ SOLN
3.0000 mL | Freq: Two times a day (BID) | INTRAMUSCULAR | Status: DC
  Administered 2013-11-09 – 2013-11-11 (×5): 3 mL via INTRAVENOUS
  Administered 2013-11-12: 10:00:00 via INTRAVENOUS

## 2013-11-08 MED ORDER — MAGNESIUM SULFATE 40 MG/ML IJ SOLN
INTRAMUSCULAR | Status: AC
  Filled 2013-11-08: qty 100

## 2013-11-08 MED ORDER — PHENYLEPHRINE HCL 10 MG/ML IJ SOLN
30.0000 ug/min | INTRAVENOUS | Status: DC
  Administered 2013-11-08: 50 ug/min via INTRAVENOUS
  Filled 2013-11-08: qty 2

## 2013-11-08 MED ORDER — ALBUMIN HUMAN 5 % IV SOLN
250.0000 mL | INTRAVENOUS | Status: AC | PRN
  Administered 2013-11-08 – 2013-11-09 (×5): 250 mL via INTRAVENOUS
  Filled 2013-11-08 (×2): qty 250

## 2013-11-08 MED ORDER — MORPHINE SULFATE 2 MG/ML IJ SOLN: 1.0000 mg | INTRAMUSCULAR | Status: AC | PRN

## 2013-11-08 MED ORDER — CALCIUM CHLORIDE 10 % IV SOLN
INTRAVENOUS | Status: AC
  Filled 2013-11-08: qty 20

## 2013-11-08 MED ORDER — SODIUM CHLORIDE 0.9 % IV SOLN
250.0000 mL | INTRAVENOUS | Status: DC
  Administered 2013-11-14: 10 mL via INTRAVENOUS
  Administered 2013-11-21: 250 mL via INTRAVENOUS

## 2013-11-08 MED ORDER — SUCCINYLCHOLINE CHLORIDE 20 MG/ML IJ SOLN
INTRAMUSCULAR | Status: AC
  Filled 2013-11-08: qty 1

## 2013-11-08 MED ORDER — DEXMEDETOMIDINE HCL IN NACL 400 MCG/100ML IV SOLN
0.1000 ug/kg/h | INTRAVENOUS | Status: DC
  Administered 2013-11-09: 0.6 ug/kg/h via INTRAVENOUS
  Administered 2013-11-09 (×2): 0.7 ug/kg/h via INTRAVENOUS
  Administered 2013-11-09: 0.6 ug/kg/h via INTRAVENOUS
  Administered 2013-11-10: 0.3 ug/kg/h via INTRAVENOUS
  Filled 2013-11-08 (×2): qty 50
  Filled 2013-11-08 (×3): qty 100

## 2013-11-08 MED ORDER — DEXTROSE 5 % IV SOLN
1.5000 g | Freq: Two times a day (BID) | INTRAVENOUS | Status: AC
  Administered 2013-11-09 (×2): 1.5 g via INTRAVENOUS
  Filled 2013-11-08 (×2): qty 1.5

## 2013-11-08 MED ORDER — LACTATED RINGERS IR SOLN
Status: DC | PRN
  Administered 2013-11-08: 1000 mL

## 2013-11-08 MED ORDER — SODIUM CHLORIDE 0.9 % IV SOLN
10.0000 g | INTRAVENOUS | Status: DC | PRN
  Administered 2013-11-08: 5 g/h via INTRAVENOUS
  Administered 2013-11-08: 13:00:00 via INTRAVENOUS

## 2013-11-08 MED ORDER — FENTANYL CITRATE 0.05 MG/ML IJ SOLN
INTRAMUSCULAR | Status: AC
  Filled 2013-11-08: qty 5

## 2013-11-08 MED ORDER — METOPROLOL TARTRATE 25 MG/10 ML ORAL SUSPENSION
12.5000 mg | Freq: Two times a day (BID) | ORAL | Status: DC
  Administered 2013-11-13 – 2013-11-29 (×6): 12.5 mg
  Filled 2013-11-08 (×49): qty 5

## 2013-11-08 MED ORDER — ACETAMINOPHEN 650 MG RE SUPP
650.0000 mg | Freq: Once | RECTAL | Status: AC
  Administered 2013-11-08: 650 mg via RECTAL

## 2013-11-08 MED ORDER — ATROPINE SULFATE 1 MG/ML IJ SOLN
INTRAMUSCULAR | Status: DC | PRN
  Administered 2013-11-08 (×3): .1 mg via INTRAVENOUS

## 2013-11-08 MED ORDER — POTASSIUM CHLORIDE 10 MEQ/50ML IV SOLN: 10.0000 meq | INTRAVENOUS | Status: AC

## 2013-11-08 MED ORDER — DEXMEDETOMIDINE HCL IN NACL 400 MCG/100ML IV SOLN
0.4000 ug/kg/h | INTRAVENOUS | Status: DC
  Filled 2013-11-08: qty 100

## 2013-11-08 MED ORDER — SODIUM CHLORIDE 0.9 % IV SOLN
INTRAVENOUS | Status: DC
  Administered 2013-11-19 – 2013-11-23 (×3): 10 mL/h via INTRAVENOUS
  Administered 2013-11-26: 14:00:00 via INTRAVENOUS
  Administered 2013-11-27: 10 mL/h via INTRAVENOUS

## 2013-11-08 MED ORDER — DEXTROSE 5 % IV SOLN: 0.0000 ug/min | INTRAVENOUS | Status: DC

## 2013-11-08 MED ORDER — DOPAMINE-DEXTROSE 1.6-5 MG/ML-% IV SOLN
INTRAVENOUS | Status: DC | PRN
  Administered 2013-11-08: 3 ug/kg/min via INTRAVENOUS

## 2013-11-08 MED ORDER — COAGULATION FACTOR VIIA RECOMB 1 MG IV SOLR
45.0000 ug/kg | Freq: Once | INTRAVENOUS | Status: AC
  Administered 2013-11-08: 2000 ug via INTRAVENOUS
  Filled 2013-11-08: qty 4

## 2013-11-08 MED ORDER — VANCOMYCIN HCL IN DEXTROSE 1-5 GM/200ML-% IV SOLN
1000.0000 mg | Freq: Once | INTRAVENOUS | Status: AC
  Administered 2013-11-08: 1000 mg via INTRAVENOUS
  Filled 2013-11-08: qty 200

## 2013-11-08 MED ORDER — PROPOFOL 10 MG/ML IV BOLUS
INTRAVENOUS | Status: AC
  Filled 2013-11-08: qty 20

## 2013-11-08 MED ORDER — BISACODYL 5 MG PO TBEC
10.0000 mg | DELAYED_RELEASE_TABLET | Freq: Every day | ORAL | Status: DC
  Administered 2013-11-10 – 2013-11-16 (×4): 10 mg via ORAL
  Filled 2013-11-08 (×4): qty 2

## 2013-11-08 MED ORDER — ACETAMINOPHEN 160 MG/5ML PO SOLN
1000.0000 mg | Freq: Four times a day (QID) | ORAL | Status: AC
  Administered 2013-11-09 (×2): 1000 mg
  Filled 2013-11-08 (×3): qty 40.6

## 2013-11-08 MED ORDER — CALCIUM CHLORIDE 10 % IV SOLN
INTRAVENOUS | Status: DC | PRN
  Administered 2013-11-08: 300 mg via INTRAVENOUS
  Administered 2013-11-08: 100 mg via INTRAVENOUS

## 2013-11-08 MED ORDER — OXYCODONE HCL 5 MG PO TABS: 5.0000 mg | ORAL_TABLET | ORAL | Status: DC | PRN

## 2013-11-08 MED ORDER — EPHEDRINE SULFATE 50 MG/ML IJ SOLN
INTRAMUSCULAR | Status: AC
  Filled 2013-11-08: qty 1

## 2013-11-08 MED ORDER — SODIUM CHLORIDE 0.9 % IJ SOLN
OROMUCOSAL | Status: DC | PRN
  Administered 2013-11-08 (×5): via TOPICAL

## 2013-11-08 MED ORDER — MILRINONE IN DEXTROSE 20 MG/100ML IV SOLN
0.2000 ug/kg/min | INTRAVENOUS | Status: DC
Start: 2013-11-08 — End: 2013-11-19
  Administered 2013-11-08 – 2013-11-10 (×4): 0.375 ug/kg/min via INTRAVENOUS
  Administered 2013-11-12 – 2013-11-14 (×4): 0.3 ug/kg/min via INTRAVENOUS
  Administered 2013-11-14 – 2013-11-19 (×6): 0.2 ug/kg/min via INTRAVENOUS
  Filled 2013-11-08 (×19): qty 100

## 2013-11-08 MED ORDER — METOPROLOL TARTRATE 12.5 MG HALF TABLET
12.5000 mg | ORAL_TABLET | Freq: Two times a day (BID) | ORAL | Status: DC
  Administered 2013-11-13 – 2013-12-02 (×31): 12.5 mg via ORAL
  Filled 2013-11-08 (×49): qty 1

## 2013-11-08 MED ORDER — MIDAZOLAM HCL 5 MG/5ML IJ SOLN
INTRAMUSCULAR | Status: DC | PRN
  Administered 2013-11-08: 1 mg via INTRAVENOUS
  Administered 2013-11-08: 3 mg via INTRAVENOUS
  Administered 2013-11-08: 1 mg via INTRAVENOUS

## 2013-11-08 MED ORDER — LIDOCAINE HCL (CARDIAC) 20 MG/ML IV SOLN
INTRAVENOUS | Status: DC | PRN
  Administered 2013-11-08: 20 mg via INTRAVENOUS

## 2013-11-08 MED ORDER — MAGNESIUM SULFATE 4000MG/100ML IJ SOLN
4.0000 g | Freq: Once | INTRAMUSCULAR | Status: AC
Start: 2013-11-08 — End: 2013-11-08
  Administered 2013-11-08: 4 g via INTRAVENOUS

## 2013-11-08 MED ORDER — INSULIN REGULAR BOLUS VIA INFUSION
0.0000 [IU] | Freq: Three times a day (TID) | INTRAVENOUS | Status: DC
  Filled 2013-11-08: qty 10

## 2013-11-08 MED ORDER — ARTIFICIAL TEARS OP OINT
TOPICAL_OINTMENT | OPHTHALMIC | Status: AC
  Filled 2013-11-08: qty 3.5

## 2013-11-08 MED ORDER — PROTAMINE SULFATE 10 MG/ML IV SOLN
INTRAVENOUS | Status: AC
  Filled 2013-11-08: qty 5

## 2013-11-08 MED ORDER — ROCURONIUM BROMIDE 100 MG/10ML IV SOLN
INTRAVENOUS | Status: DC | PRN
  Administered 2013-11-08 (×2): 50 mg via INTRAVENOUS

## 2013-11-08 MED ORDER — PROTAMINE SULFATE 10 MG/ML IV SOLN
INTRAVENOUS | Status: DC | PRN
  Administered 2013-11-08: 300 mg via INTRAVENOUS

## 2013-11-08 MED ORDER — HEPARIN SODIUM (PORCINE) 1000 UNIT/ML IJ SOLN
INTRAMUSCULAR | Status: AC
  Filled 2013-11-08: qty 1

## 2013-11-08 MED ORDER — SODIUM CHLORIDE 0.9 % IV SOLN: Freq: Once | INTRAVENOUS | Status: DC

## 2013-11-08 MED ORDER — MIDAZOLAM HCL 10 MG/2ML IJ SOLN
INTRAMUSCULAR | Status: AC
  Filled 2013-11-08: qty 2

## 2013-11-08 MED ORDER — DOCUSATE SODIUM 100 MG PO CAPS: 200.0000 mg | ORAL_CAPSULE | Freq: Every day | ORAL | Status: DC

## 2013-11-08 MED ORDER — LACTATED RINGERS IV SOLN
INTRAVENOUS | Status: DC | PRN
  Administered 2013-11-08 (×2): via INTRAVENOUS

## 2013-11-08 MED ORDER — DOPAMINE-DEXTROSE 3.2-5 MG/ML-% IV SOLN
0.0000 ug/kg/min | INTRAVENOUS | Status: DC
  Filled 2013-11-08: qty 250

## 2013-11-08 MED ORDER — 0.9 % SODIUM CHLORIDE (POUR BTL) OPTIME
TOPICAL | Status: DC | PRN
  Administered 2013-11-08: 6000 mL
  Administered 2013-11-08: 5000 mL

## 2013-11-08 MED ORDER — VECURONIUM BROMIDE 10 MG IV SOLR
INTRAVENOUS | Status: DC | PRN
  Administered 2013-11-08: 3 mg via INTRAVENOUS
  Administered 2013-11-08: 2 mg via INTRAVENOUS
  Administered 2013-11-08: 3 mg via INTRAVENOUS
  Administered 2013-11-08: 2 mg via INTRAVENOUS
  Administered 2013-11-08: 4 mg via INTRAVENOUS
  Administered 2013-11-08: 3 mg via INTRAVENOUS

## 2013-11-08 MED ORDER — MILRINONE IN DEXTROSE 20 MG/100ML IV SOLN
0.1250 ug/kg/min | INTRAVENOUS | Status: DC
  Filled 2013-11-08: qty 100

## 2013-11-08 MED ORDER — ROCURONIUM BROMIDE 50 MG/5ML IV SOLN
INTRAVENOUS | Status: AC
  Filled 2013-11-08: qty 2

## 2013-11-08 MED ORDER — PANTOPRAZOLE SODIUM 40 MG PO TBEC: 40.0000 mg | DELAYED_RELEASE_TABLET | Freq: Every day | ORAL | Status: DC

## 2013-11-08 MED ORDER — NOREPINEPHRINE BITARTRATE 1 MG/ML IV SOLN
2.0000 ug/min | INTRAVENOUS | Status: DC
  Administered 2013-11-08 (×2): 10 ug/min via INTRAVENOUS
  Filled 2013-11-08 (×3): qty 4

## 2013-11-08 MED ORDER — HYDROCORTISONE NA SUCCINATE PF 100 MG IJ SOLR
INTRAMUSCULAR | Status: DC | PRN
  Administered 2013-11-08: 250 mg via INTRAVENOUS

## 2013-11-08 MED ORDER — ATROPINE SULFATE 0.1 MG/ML IJ SOLN
INTRAMUSCULAR | Status: AC
  Filled 2013-11-08: qty 10

## 2013-11-08 MED ORDER — FENTANYL CITRATE 0.05 MG/ML IJ SOLN
INTRAMUSCULAR | Status: DC | PRN
  Administered 2013-11-08 (×2): 50 ug via INTRAVENOUS
  Administered 2013-11-08: 350 ug via INTRAVENOUS
  Administered 2013-11-08: 50 ug via INTRAVENOUS
  Administered 2013-11-08 (×2): 100 ug via INTRAVENOUS
  Administered 2013-11-08 (×2): 150 ug via INTRAVENOUS
  Administered 2013-11-08: 100 ug via INTRAVENOUS
  Administered 2013-11-08: 150 ug via INTRAVENOUS

## 2013-11-08 MED ORDER — HEPARIN SODIUM (PORCINE) 1000 UNIT/ML IJ SOLN
INTRAMUSCULAR | Status: DC | PRN
  Administered 2013-11-08: 22000 [IU] via INTRAVENOUS
  Administered 2013-11-08: 3000 [IU] via INTRAVENOUS

## 2013-11-08 MED ORDER — GLYCOPYRROLATE 0.2 MG/ML IJ SOLN
INTRAMUSCULAR | Status: DC | PRN
  Administered 2013-11-08: 0.4 mg via INTRAVENOUS

## 2013-11-08 MED ORDER — CALCIUM CHLORIDE 10 % IV SOLN
1.0000 g | Freq: Once | INTRAVENOUS | Status: AC
  Administered 2013-11-08: 1 g via INTRAVENOUS

## 2013-11-08 MED ORDER — ACETAMINOPHEN 160 MG/5ML PO SOLN: 650.0000 mg | Freq: Once | ORAL | Status: AC

## 2013-11-08 MED ORDER — LACTATED RINGERS IV SOLN
INTRAVENOUS | Status: DC | PRN
  Administered 2013-11-08 (×3): via INTRAVENOUS

## 2013-11-08 MED ORDER — SODIUM CHLORIDE 0.9 % IV SOLN
INTRAVENOUS | Status: DC
  Filled 2013-11-08: qty 40

## 2013-11-08 MED ORDER — INSULIN REGULAR HUMAN 100 UNIT/ML IJ SOLN
INTRAMUSCULAR | Status: DC
  Administered 2013-11-09: 1.5 [IU]/h via INTRAVENOUS
  Administered 2013-11-10: 1.4 [IU]/h via INTRAVENOUS
  Filled 2013-11-08 (×3): qty 2.5

## 2013-11-08 MED ORDER — SODIUM BICARBONATE 8.4 % IV SOLN
50.0000 meq | Freq: Once | INTRAVENOUS | Status: AC
  Administered 2013-11-08: 50 meq via INTRAVENOUS

## 2013-11-08 MED ORDER — LACTATED RINGERS IV SOLN
INTRAVENOUS | Status: DC
  Administered 2013-11-08: 18:00:00 via INTRAVENOUS

## 2013-11-08 MED ORDER — LACTATED RINGERS IV SOLN
INTRAVENOUS | Status: DC | PRN
  Administered 2013-11-08 (×2): via INTRAVENOUS

## 2013-11-08 MED ORDER — SODIUM BICARBONATE 4.2 % IV SOLN
INTRAVENOUS | Status: DC | PRN
  Administered 2013-11-08 (×2): 25 meq via INTRAVENOUS

## 2013-11-08 MED ORDER — MIDAZOLAM HCL 2 MG/2ML IJ SOLN
2.0000 mg | INTRAMUSCULAR | Status: DC | PRN
  Administered 2013-11-08 – 2013-11-09 (×4): 2 mg via INTRAVENOUS
  Filled 2013-11-08 (×4): qty 2

## 2013-11-08 MED ORDER — HYDROCORTISONE NA SUCCINATE PF 250 MG IJ SOLR
INTRAMUSCULAR | Status: AC
Start: 2013-11-08 — End: 2013-11-08
  Filled 2013-11-08: qty 250

## 2013-11-08 MED ORDER — LIDOCAINE HCL (CARDIAC) 20 MG/ML IV SOLN
INTRAVENOUS | Status: AC
  Filled 2013-11-08: qty 5

## 2013-11-08 MED ORDER — LACTATED RINGERS IV SOLN: 500.0000 mL | Freq: Once | INTRAVENOUS | Status: DC | PRN

## 2013-11-08 MED ORDER — SODIUM CHLORIDE 0.45 % IV SOLN
INTRAVENOUS | Status: DC
  Administered 2013-11-08: 18:00:00 via INTRAVENOUS

## 2013-11-08 MED ORDER — HEMOSTATIC AGENTS (NO CHARGE) OPTIME
TOPICAL | Status: DC | PRN
  Administered 2013-11-08: 1 via TOPICAL

## 2013-11-08 MED ORDER — ALBUMIN HUMAN 5 % IV SOLN
INTRAVENOUS | Status: DC | PRN
  Administered 2013-11-08: 15:00:00 via INTRAVENOUS

## 2013-11-08 MED ORDER — ASPIRIN EC 325 MG PO TBEC: 325.0000 mg | DELAYED_RELEASE_TABLET | Freq: Every day | ORAL | Status: DC

## 2013-11-08 MED ORDER — VECURONIUM BROMIDE 10 MG IV SOLR
INTRAVENOUS | Status: AC
  Filled 2013-11-08: qty 10

## 2013-11-08 MED ORDER — MORPHINE SULFATE 2 MG/ML IJ SOLN
2.0000 mg | INTRAMUSCULAR | Status: DC | PRN
  Administered 2013-11-09 – 2013-11-10 (×9): 2 mg via INTRAVENOUS
  Filled 2013-11-08 (×9): qty 1

## 2013-11-08 MED ORDER — FAMOTIDINE IN NACL 20-0.9 MG/50ML-% IV SOLN
20.0000 mg | Freq: Two times a day (BID) | INTRAVENOUS | Status: AC
  Administered 2013-11-08 – 2013-11-09 (×2): 20 mg via INTRAVENOUS
  Filled 2013-11-08 (×2): qty 50

## 2013-11-08 MED ORDER — SODIUM CHLORIDE 0.9 % IJ SOLN: 3.0000 mL | INTRAMUSCULAR | Status: DC | PRN

## 2013-11-08 MED ORDER — DEXTROSE 5 % IV SOLN
2.0000 ug/min | INTRAVENOUS | Status: DC
  Administered 2013-11-09: 10 ug/min via INTRAVENOUS
  Filled 2013-11-08 (×2): qty 4

## 2013-11-08 MED ORDER — MILRINONE IN DEXTROSE 20 MG/100ML IV SOLN
INTRAVENOUS | Status: DC | PRN
  Administered 2013-11-08: 0.375 ug/kg/min via INTRAVENOUS

## 2013-11-08 MED ORDER — ONDANSETRON HCL 4 MG/2ML IJ SOLN
4.0000 mg | Freq: Four times a day (QID) | INTRAMUSCULAR | Status: DC | PRN
  Administered 2013-11-10 – 2013-12-12 (×31): 4 mg via INTRAVENOUS
  Filled 2013-11-08 (×31): qty 2

## 2013-11-08 MED ORDER — PROTAMINE SULFATE 10 MG/ML IV SOLN
INTRAVENOUS | Status: AC
  Filled 2013-11-08: qty 25

## 2013-11-08 MED ORDER — FUROSEMIDE 10 MG/ML IJ SOLN
INTRAMUSCULAR | Status: DC | PRN
  Administered 2013-11-08: 20 mg via INTRAMUSCULAR

## 2013-11-08 MED ORDER — DIPHENHYDRAMINE HCL 50 MG/ML IJ SOLN
INTRAMUSCULAR | Status: DC | PRN
  Administered 2013-11-08: 50 mg via INTRAVENOUS

## 2013-11-08 MED ORDER — PHENYLEPHRINE HCL 10 MG/ML IJ SOLN
INTRAMUSCULAR | Status: DC | PRN
  Administered 2013-11-08 (×3): 120 ug via INTRAVENOUS
  Administered 2013-11-08: 160 ug via INTRAVENOUS
  Administered 2013-11-08 (×2): 120 ug via INTRAVENOUS

## 2013-11-08 MED ORDER — COAGULATION FACTOR VIIA RECOMB 1 MG IV SOLR
1.0000 mg | Freq: Once | INTRAVENOUS | Status: AC
  Administered 2013-11-08: 1 mg via INTRAVENOUS
  Filled 2013-11-08: qty 1

## 2013-11-08 MED ORDER — PROPOFOL 10 MG/ML IV BOLUS
INTRAVENOUS | Status: DC | PRN
  Administered 2013-11-08: 50 mg via INTRAVENOUS

## 2013-11-08 MED ORDER — EPINEPHRINE HCL 0.1 MG/ML IJ SOSY
PREFILLED_SYRINGE | INTRAMUSCULAR | Status: AC
  Filled 2013-11-08: qty 20

## 2013-11-08 MED ORDER — ASPIRIN 81 MG PO CHEW: 324.0000 mg | CHEWABLE_TABLET | Freq: Every day | ORAL | Status: DC

## 2013-11-08 MED ORDER — METOPROLOL TARTRATE 1 MG/ML IV SOLN
2.5000 mg | INTRAVENOUS | Status: DC | PRN
Start: 2013-11-08 — End: 2013-12-09
  Administered 2013-11-10: 5 mg via INTRAVENOUS
  Filled 2013-11-08 (×2): qty 5

## 2013-11-08 MED ORDER — NITROGLYCERIN IN D5W 200-5 MCG/ML-% IV SOLN: 0.0000 ug/min | INTRAVENOUS | Status: DC

## 2013-11-08 MED ORDER — PLASMA-LYTE 148 IV SOLN
INTRAVENOUS | Status: DC | PRN
  Administered 2013-11-08: 500 mL via INTRAVENOUS

## 2013-11-08 SURGICAL SUPPLY — 145 items
ADAPTER CARDIO PERF ANTE/RETRO (ADAPTER) ×6 IMPLANT
ADPR PRFSN 84XANTGRD RTRGD (ADAPTER) ×2
APPLICATOR COTTON TIP 6IN STRL (MISCELLANEOUS) IMPLANT
ATTRACTOMAT 16X20 MAGNETIC DRP (DRAPES) ×6 IMPLANT
BAG DECANTER FOR FLEXI CONT (MISCELLANEOUS) ×6 IMPLANT
BANDAGE ELASTIC 4 VELCRO ST LF (GAUZE/BANDAGES/DRESSINGS) ×6 IMPLANT
BANDAGE ELASTIC 6 VELCRO ST LF (GAUZE/BANDAGES/DRESSINGS) ×6 IMPLANT
BASKET HEART  (ORDER IN 25'S) (MISCELLANEOUS) ×1
BASKET HEART (ORDER IN 25'S) (MISCELLANEOUS) ×1
BASKET HEART (ORDER IN 25S) (MISCELLANEOUS) ×2 IMPLANT
BLADE STERNUM SYSTEM 6 (BLADE) ×6 IMPLANT
BLADE SURG 12 STRL SS (BLADE) ×6 IMPLANT
BLADE SURG 15 STRL LF DISP TIS (BLADE) ×2 IMPLANT
BLADE SURG 15 STRL SS (BLADE) ×12
BLADE SURG ROTATE 9660 (MISCELLANEOUS) IMPLANT
BNDG GAUZE ELAST 4 BULKY (GAUZE/BANDAGES/DRESSINGS) ×6 IMPLANT
BOOT SUTURE AID YELLOW STND (SUTURE) IMPLANT
CANISTER SUCTION 2500CC (MISCELLANEOUS) ×8 IMPLANT
CANN PRFSN 3/8XRT ANG TPR 14 (MISCELLANEOUS) ×2
CANNULA ARTERIAL NVNT 3/8 20FR (MISCELLANEOUS) ×2 IMPLANT
CANNULA GUNDRY RCSP 15FR (MISCELLANEOUS) ×8 IMPLANT
CANNULA PRFSN 3/8XRT ANG TPR14 (MISCELLANEOUS) IMPLANT
CANNULA VEN MTL TIP RT (MISCELLANEOUS) ×4
CANNULA VENOUS LOW PROF 32X40 (CANNULA) IMPLANT
CANNULA VRC MALB SNGL STG 28FR (MISCELLANEOUS) IMPLANT
CARDIAC SUCTION (MISCELLANEOUS) ×4 IMPLANT
CATH CPB KIT VANTRIGT (MISCELLANEOUS) ×4 IMPLANT
CATH ROBINSON RED A/P 18FR (CATHETERS) ×24 IMPLANT
CATH THORACIC 36FR RT ANG (CATHETERS) ×8 IMPLANT
CLIP TI WIDE RED SMALL 24 (CLIP) ×2 IMPLANT
CONN 1/2X1/2X1/2  BEN (MISCELLANEOUS) ×4
CONN 1/2X1/2X1/2 BEN (MISCELLANEOUS) ×2 IMPLANT
CONN 3/8X1/2 ST GISH (MISCELLANEOUS) ×8 IMPLANT
CONT SPEC 4OZ CLIKSEAL STRL BL (MISCELLANEOUS) ×2 IMPLANT
COUNTER NEEDLE 20 DBL MAG RED (NEEDLE) ×2 IMPLANT
COVER SURGICAL LIGHT HANDLE (MISCELLANEOUS) ×6 IMPLANT
CRADLE DONUT ADULT HEAD (MISCELLANEOUS) ×6 IMPLANT
DRAIN CHANNEL 32F RND 10.7 FF (WOUND CARE) ×4 IMPLANT
DRAPE CARDIOVASCULAR INCISE (DRAPES) ×8
DRAPE PROXIMA HALF (DRAPES) ×2 IMPLANT
DRAPE SLUSH/WARMER DISC (DRAPES) ×6 IMPLANT
DRAPE SRG 135X102X78XABS (DRAPES) ×4 IMPLANT
DRSG AQUACEL AG ADV 3.5X14 (GAUZE/BANDAGES/DRESSINGS) ×6 IMPLANT
ELECT BLADE 4.0 EZ CLEAN MEGAD (MISCELLANEOUS) ×4
ELECT BLADE 6.5 EXT (BLADE) ×6 IMPLANT
ELECT CAUTERY BLADE 6.4 (BLADE) ×6 IMPLANT
ELECT REM PT RETURN 9FT ADLT (ELECTROSURGICAL) ×8
ELECTRODE BLDE 4.0 EZ CLN MEGD (MISCELLANEOUS) ×2 IMPLANT
ELECTRODE REM PT RTRN 9FT ADLT (ELECTROSURGICAL) ×8 IMPLANT
GAUZE SPONGE 4X4 12PLY STRL (GAUZE/BANDAGES/DRESSINGS) ×12 IMPLANT
GLOVE BIO SURGEON STRL SZ 6 (GLOVE) ×8 IMPLANT
GLOVE BIO SURGEON STRL SZ 6.5 (GLOVE) ×7 IMPLANT
GLOVE BIO SURGEON STRL SZ7 (GLOVE) ×2 IMPLANT
GLOVE BIO SURGEON STRL SZ7.5 (GLOVE) ×22 IMPLANT
GLOVE BIO SURGEON STRL SZ8 (GLOVE) ×2 IMPLANT
GLOVE BIO SURGEONS STRL SZ 6.5 (GLOVE) ×7
GLOVE BIOGEL PI IND STRL 7.0 (GLOVE) IMPLANT
GLOVE BIOGEL PI INDICATOR 7.0 (GLOVE) ×6
GOWN STRL REUS W/ TWL LRG LVL3 (GOWN DISPOSABLE) ×16 IMPLANT
GOWN STRL REUS W/TWL LRG LVL3 (GOWN DISPOSABLE) ×56
GRAFT VASC ANGIOGRAFT 61-70 (Tissue) IMPLANT
HEMOSTAT POWDER SURGIFOAM 1G (HEMOSTASIS) ×20 IMPLANT
HEMOSTAT SURGICEL 2X14 (HEMOSTASIS) ×6 IMPLANT
INSERT FOGARTY XLG (MISCELLANEOUS) IMPLANT
IV CATH 22GX1 FEP (IV SOLUTION) ×2 IMPLANT
KIT BASIN OR (CUSTOM PROCEDURE TRAY) ×6 IMPLANT
KIT ROOM TURNOVER OR (KITS) ×6 IMPLANT
KIT SUCTION CATH 14FR (SUCTIONS) ×8 IMPLANT
KIT VASOVIEW W/TROCAR VH 2000 (KITS) ×4 IMPLANT
LEAD PACING MYOCARDI (MISCELLANEOUS) ×4 IMPLANT
LINE VENT (MISCELLANEOUS) ×2 IMPLANT
LOOP VESSEL SUPERMAXI WHITE (MISCELLANEOUS) ×4 IMPLANT
MARKER GRAFT CORONARY BYPASS (MISCELLANEOUS) ×12 IMPLANT
NS IRRIG 1000ML POUR BTL (IV SOLUTION) ×32 IMPLANT
PACK OPEN HEART (CUSTOM PROCEDURE TRAY) ×6 IMPLANT
PAD ARMBOARD 7.5X6 YLW CONV (MISCELLANEOUS) ×16 IMPLANT
PAD ELECT DEFIB RADIOL ZOLL (MISCELLANEOUS) ×4 IMPLANT
PENCIL BUTTON HOLSTER BLD 10FT (ELECTRODE) ×4 IMPLANT
PUNCH AORTIC ROTATE  4.5MM 8IN (MISCELLANEOUS) ×2 IMPLANT
PUNCH AORTIC ROTATE 4.0MM (MISCELLANEOUS) IMPLANT
PUNCH AORTIC ROTATE 4.5MM 8IN (MISCELLANEOUS) IMPLANT
PUNCH AORTIC ROTATE 5MM 8IN (MISCELLANEOUS) IMPLANT
RING ANLPLS CARP-EDW PHY II 24 (Prosthesis & Implant Heart) IMPLANT
RING ANNULOPLASTY PHY II (Prosthesis & Implant Heart) ×4 IMPLANT
SET CARDIOPLEGIA MPS 5001102 (MISCELLANEOUS) ×2 IMPLANT
SPONGE GAUZE 4X4 12PLY STER LF (GAUZE/BANDAGES/DRESSINGS) ×6 IMPLANT
SPONGE LAP 18X18 X RAY DECT (DISPOSABLE) ×6 IMPLANT
STAPLER VISISTAT 35W (STAPLE) ×2 IMPLANT
STOPCOCK 4 WAY LG BORE MALE ST (IV SETS) ×2 IMPLANT
SUCKER INTRACARDIAC WEIGHTED (SUCKER) ×2 IMPLANT
SUCKER WEIGHTED FLEX (MISCELLANEOUS) ×4 IMPLANT
SURGIFLO W/THROMBIN 8M KIT (HEMOSTASIS) ×4 IMPLANT
SUT BONE WAX W31G (SUTURE) ×6 IMPLANT
SUT ETHIBON 2 0 V 52N 30 (SUTURE) ×4 IMPLANT
SUT ETHIBOND 2 0 SH (SUTURE) ×32 IMPLANT
SUT ETHIBOND 2 0 SH 36X2 (SUTURE) ×4 IMPLANT
SUT ETHIBOND 2 0 V4 (SUTURE) ×4 IMPLANT
SUT ETHIBOND 2 0V4 GREEN (SUTURE) ×4 IMPLANT
SUT ETHIBOND 4 0 TF (SUTURE) IMPLANT
SUT ETHIBOND 5 0 C 1 30 (SUTURE) ×4 IMPLANT
SUT ETHILON 3 0 FSL (SUTURE) ×4 IMPLANT
SUT MNCRL AB 4-0 PS2 18 (SUTURE) IMPLANT
SUT PROLENE 3 0 RB 1 (SUTURE) ×4 IMPLANT
SUT PROLENE 3 0 SH 1 (SUTURE) ×6 IMPLANT
SUT PROLENE 3 0 SH DA (SUTURE) ×4 IMPLANT
SUT PROLENE 3 0 SH1 36 (SUTURE) ×14 IMPLANT
SUT PROLENE 4 0 RB 1 (SUTURE) ×16
SUT PROLENE 4 0 SH DA (SUTURE) ×18 IMPLANT
SUT PROLENE 4-0 RB1 .5 CRCL 36 (SUTURE) ×6 IMPLANT
SUT PROLENE 5 0 C 1 36 (SUTURE) IMPLANT
SUT PROLENE 6 0 C 1 30 (SUTURE) ×14 IMPLANT
SUT PROLENE 6 0 CC (SUTURE) ×20 IMPLANT
SUT PROLENE 8 0 BV175 6 (SUTURE) IMPLANT
SUT PROLENE BLUE 7 0 (SUTURE) ×4 IMPLANT
SUT SILK  1 MH (SUTURE) ×2
SUT SILK 1 MH (SUTURE) IMPLANT
SUT SILK 1 TIES 10X30 (SUTURE) ×2 IMPLANT
SUT SILK 2 0 SH CR/8 (SUTURE) ×4 IMPLANT
SUT SILK 3 0 SH CR/8 (SUTURE) ×2 IMPLANT
SUT STEEL 6MS V (SUTURE) ×12 IMPLANT
SUT STEEL SZ 6 DBL 3X14 BALL (SUTURE) ×8 IMPLANT
SUT VIC AB 1 CTX 36 (SUTURE) ×16
SUT VIC AB 1 CTX36XBRD ANBCTR (SUTURE) ×8 IMPLANT
SUT VIC AB 2-0 CT1 27 (SUTURE)
SUT VIC AB 2-0 CT1 36 (SUTURE) ×4 IMPLANT
SUT VIC AB 2-0 CT1 TAPERPNT 27 (SUTURE) IMPLANT
SUT VIC AB 2-0 CTX 27 (SUTURE) IMPLANT
SUT VIC AB 3-0 X1 27 (SUTURE) IMPLANT
SUTURE E-PAK OPEN HEART (SUTURE) ×6 IMPLANT
SYR 20CC LL (SYRINGE) ×2 IMPLANT
SYSTEM SAHARA CHEST DRAIN ATS (WOUND CARE) ×6 IMPLANT
TAPE CLOTH SURG 4X10 WHT LF (GAUZE/BANDAGES/DRESSINGS) ×2 IMPLANT
TAPE PAPER 2X10 WHT MICROPORE (GAUZE/BANDAGES/DRESSINGS) ×2 IMPLANT
TOWEL OR 17X24 6PK STRL BLUE (TOWEL DISPOSABLE) ×16 IMPLANT
TOWEL OR 17X26 10 PK STRL BLUE (TOWEL DISPOSABLE) ×16 IMPLANT
TRAY CATH LUMEN 1 20CM STRL (SET/KITS/TRAYS/PACK) ×2 IMPLANT
TRAY FOLEY IC TEMP SENS 16FR (CATHETERS) ×6 IMPLANT
TUBING ART PRESS 48 MALE/FEM (TUBING) ×8 IMPLANT
TUBING INSUFFLATION 10FT LAP (TUBING) ×4 IMPLANT
UNDERPAD 30X30 INCONTINENT (UNDERPADS AND DIAPERS) ×6 IMPLANT
VALVE MAGNA MITRAL 25MM (Prosthesis & Implant Heart) ×2 IMPLANT
VEIN SAPHENOUS CRYO  61-70CM (Tissue) ×2 IMPLANT
VEIN SAPHENOUS CRYO 61-70CM (Tissue) ×2 IMPLANT
VRC MALLEABLE SINGLE STG 28FR (MISCELLANEOUS) ×4
WATER STERILE IRR 1000ML POUR (IV SOLUTION) ×12 IMPLANT

## 2013-11-08 NOTE — Progress Notes (Signed)
  Echocardiogram Echocardiogram Transesophageal has been performed.  Mauricio Po 11/08/2013, 8:34 AM

## 2013-11-08 NOTE — Anesthesia Procedure Notes (Signed)
Procedures

## 2013-11-08 NOTE — Brief Op Note (Addendum)
      [address]       [place] [address]             [phone]     11/05/2013 - 11/08/2013  1:33 PM  PATIENT:  Claudia [name]  80 y.o. female  PRE-OPERATIVE DIAGNOSIS:  CAD, MR  POST-OPERATIVE DIAGNOSIS:  Coronary Artery Disease, Mitral Regurgitation.  PROCEDURE:  Procedure(s): CORONARY ARTERY BYPASS GRAFTING (CABG) X2 LIMA  OM,,  CRYOVEIN RCA INTRAOPERATIVE TRANSESOPHAGEAL ECHOCARDIOGRAM MITRAL VALVE (MV) REPLACEMENT #25 MAGNA MITRAL EASE BILAT GSV EXPLORATION  SURGEON:  Surgeon(s): Ivin Poot, MD  PHYSICIAN ASSISTANT: Denym Christenberry PA-C  ANESTHESIA:   general  PATIENT CONDITION:  ICU - intubated and hemodynamically stable.  PRE-OPERATIVE WEIGHT: 86.7JQ  COMPLICATIONS: NO KNOWN  Mitral Valve Etiology  MV Insufficiency: Moderate  MV Disease: Yes.  MV Stenosis: No mitral valve stenosis.  MV Disease Functional Class: MV Disease Functional Class: Type II.  Etiology (Choose at least one and up to five): Degenerative. and Ischemic - acute, post infarction.   MV Lesions (Choose at least one): Mitral annular calcification.  Mitral/Tricuspid/Pulmonary Valve Procedure  Mitral Valve Procedure Performed:  Replacement: Repair attempted prior to replacement.  No Mitral Chords preserved.   Implant: Bioprosthetic Valve: Implant model number 7300TFX, Size 25, Unique Device Identifier [id number].

## 2013-11-08 NOTE — Transfer of Care (Signed)
Immediate Anesthesia Transfer of Care Note  Patient: Claudia [name]  Procedure(s) Performed: Procedure(s) with comments: CORONARY ARTERY BYPASS GRAFTING (CABG), on pump, times two, using left internal mammary artery, cryo saphenous vein. (N/A) - LIMA-LAD CRYOVEIN -OM INTRAOPERATIVE TRANSESOPHAGEAL ECHOCARDIOGRAM (N/A) MITRAL VALVE (MV) REPLACEMENT (N/A) - #25 MAGNA MITRAL EASE  Patient Location: ICU  Anesthesia Type:General  Level of Consciousness: unresponsive and Patient remains intubated per anesthesia plan  Airway & Oxygen Therapy: Patient remains intubated per anesthesia plan and Patient placed on Ventilator (see vital sign flow sheet for setting)  Post-op Assessment: Report given to PACU RN and Post -op Vital signs reviewed and stable  Post vital signs: Reviewed and stable  Complications: No apparent anesthesia complications

## 2013-11-08 NOTE — Progress Notes (Signed)
The patient was examined and preop studies reviewed. There has been no change from the prior exam and the patient is ready for surgery. Plan CABG MVR todar on R Levengood

## 2013-11-08 NOTE — Anesthesia Preprocedure Evaluation (Addendum)
Anesthesia Evaluation  Patient identified by MRN, date of birth, ID band Patient awake    Reviewed: Allergy & Precautions, H&P , NPO status , Patient's Chart, lab work & pertinent test results  Airway Mallampati: II TM Distance: >3 FB Neck ROM: Full    Dental  (+) Edentulous Upper, Edentulous Lower, Dental Advisory Given   Pulmonary          Cardiovascular hypertension, + angina + CAD, + Past MI, + Cardiac Stents and +CHF + Valvular Problems/Murmurs MR     Neuro/Psych  Headaches,    GI/Hepatic PUD, GERD-  ,  Endo/Other  diabetes  Renal/GU Renal disease     Musculoskeletal   Abdominal   Peds  Hematology  (+) anemia ,   Anesthesia Other Findings   Reproductive/Obstetrics                          Anesthesia Physical Anesthesia Plan  ASA: IV  Anesthesia Plan: General   Post-op Pain Management:    Induction: Intravenous  Airway Management Planned: Oral ETT  Additional Equipment: Arterial line, CVP, PA Cath and TEE  Intra-op Plan:   Post-operative Plan: Post-operative intubation/ventilation  Informed Consent: I have reviewed the patients History and Physical, chart, labs and discussed the procedure including the risks, benefits and alternatives for the proposed anesthesia with the patient or authorized representative who has indicated his/her understanding and acceptance.     Plan Discussed with: CRNA, Anesthesiologist and Surgeon  Anesthesia Plan Comments:         Anesthesia Quick Evaluation

## 2013-11-09 ENCOUNTER — Inpatient Hospital Stay (HOSPITAL_COMMUNITY): Payer: Medicare HMO

## 2013-11-09 DIAGNOSIS — I319 Disease of pericardium, unspecified: Secondary | ICD-10-CM

## 2013-11-09 LAB — HEPATIC FUNCTION PANEL
ALT: 19 U/L (ref 0–35)
AST: 78 U/L — ABNORMAL HIGH (ref 0–37)
Albumin: 3.1 g/dL — ABNORMAL LOW (ref 3.5–5.2)
Alkaline Phosphatase: 31 U/L — ABNORMAL LOW (ref 39–117)
Bilirubin, Direct: 0.4 mg/dL — ABNORMAL HIGH (ref 0.0–0.3)
Indirect Bilirubin: 1.2 mg/dL — ABNORMAL HIGH (ref 0.3–0.9)
Total Bilirubin: 1.6 mg/dL — ABNORMAL HIGH (ref 0.3–1.2)
Total Protein: 4.5 g/dL — ABNORMAL LOW (ref 6.0–8.3)

## 2013-11-09 LAB — BASIC METABOLIC PANEL
Anion gap: 14 (ref 5–15)
Anion gap: 15 (ref 5–15)
Anion gap: 15 (ref 5–15)
BUN: 28 mg/dL — ABNORMAL HIGH (ref 6–23)
BUN: 30 mg/dL — ABNORMAL HIGH (ref 6–23)
BUN: 32 mg/dL — ABNORMAL HIGH (ref 6–23)
CO2: 19 mEq/L (ref 19–32)
CO2: 19 mEq/L (ref 19–32)
CO2: 20 mEq/L (ref 19–32)
Calcium: 9.1 mg/dL (ref 8.4–10.5)
Calcium: 8.7 mg/dL (ref 8.4–10.5)
Calcium: 8.5 mg/dL (ref 8.4–10.5)
Chloride: 100 mEq/L (ref 96–112)
Chloride: 97 mEq/L (ref 96–112)
Chloride: 94 mEq/L — ABNORMAL LOW (ref 96–112)
Creatinine, Ser: 1.73 mg/dL — ABNORMAL HIGH (ref 0.50–1.10)
Creatinine, Ser: 2.14 mg/dL — ABNORMAL HIGH (ref 0.50–1.10)
Creatinine, Ser: 2.3 mg/dL — ABNORMAL HIGH (ref 0.50–1.10)
GFR calc Af Amer: 24 mL/min — ABNORMAL LOW (ref >90)
GFR calc Af Amer: 22 mL/min — ABNORMAL LOW (ref >90)
GFR calc non Af Amer: 21 mL/min — ABNORMAL LOW (ref >90)
GFR calc non Af Amer: 19 mL/min — ABNORMAL LOW (ref >90)
GFR, EST AFRICAN AMERICAN: 31 mL/min — AB (ref >90)
GFR, EST NON AFRICAN AMERICAN: 27 mL/min — AB (ref >90)
GLUCOSE: 118 mg/dL — AB (ref 70–99)
Glucose, Bld: 109 mg/dL — ABNORMAL HIGH (ref 70–99)
Glucose, Bld: 149 mg/dL — ABNORMAL HIGH (ref 70–99)
POTASSIUM: 4.1 meq/L (ref 3.7–5.3)
Potassium: 4.3 mEq/L (ref 3.7–5.3)
Potassium: 4.7 mEq/L (ref 3.7–5.3)
SODIUM: 133 meq/L — AB (ref 137–147)
Sodium: 131 mEq/L — ABNORMAL LOW (ref 137–147)
Sodium: 129 mEq/L — ABNORMAL LOW (ref 137–147)

## 2013-11-09 LAB — POCT I-STAT 3, ART BLOOD GAS (G3+)
ACID-BASE DEFICIT: 5 mmol/L — AB (ref 0.0–2.0)
Acid-base deficit: 3 mmol/L — ABNORMAL HIGH (ref 0.0–2.0)
Acid-base deficit: 4 mmol/L — ABNORMAL HIGH (ref 0.0–2.0)
Bicarbonate: 20.5 mEq/L (ref 20.0–24.0)
Bicarbonate: 20.3 mEq/L (ref 20.0–24.0)
Bicarbonate: 21.1 mEq/L (ref 20.0–24.0)
O2 SAT: 99 %
O2 SAT: 99 %
O2 Saturation: 100 %
PCO2 ART: 31.6 mmHg — AB (ref 35.0–45.0)
PH ART: 7.423 (ref 7.350–7.450)
PO2 ART: 135 mmHg — AB (ref 80.0–100.0)
Patient temperature: 37.7
Patient temperature: 37.1
Patient temperature: 37.2
TCO2: 21 mmol/L (ref 0–100)
TCO2: 21 mmol/L (ref 0–100)
TCO2: 22 mmol/L (ref 0–100)
pCO2 arterial: 36.4 mmHg (ref 35.0–45.0)
pCO2 arterial: 36.6 mmHg (ref 35.0–45.0)
pH, Arterial: 7.356 (ref 7.350–7.450)
pH, Arterial: 7.369 (ref 7.350–7.450)
pO2, Arterial: 166 mmHg — ABNORMAL HIGH (ref 80.0–100.0)
pO2, Arterial: 168 mmHg — ABNORMAL HIGH (ref 80.0–100.0)

## 2013-11-09 LAB — CBC
HCT: 28.1 % — ABNORMAL LOW (ref 36.0–46.0)
HCT: 25.8 % — ABNORMAL LOW (ref 36.0–46.0)
HCT: 25 % — ABNORMAL LOW (ref 36.0–46.0)
Hemoglobin: 10 g/dL — ABNORMAL LOW (ref 12.0–15.0)
Hemoglobin: 9.3 g/dL — ABNORMAL LOW (ref 12.0–15.0)
Hemoglobin: 9 g/dL — ABNORMAL LOW (ref 12.0–15.0)
MCH: 29.7 pg (ref 26.0–34.0)
MCH: 30 pg (ref 26.0–34.0)
MCH: 30.1 pg (ref 26.0–34.0)
MCHC: 35.6 g/dL (ref 30.0–36.0)
MCHC: 36 g/dL (ref 30.0–36.0)
MCHC: 36 g/dL (ref 30.0–36.0)
MCV: 83.4 fL (ref 78.0–100.0)
MCV: 83.2 fL (ref 78.0–100.0)
MCV: 83.6 fL (ref 78.0–100.0)
PLATELETS: 82 10*3/uL — AB (ref 150–400)
Platelets: 71 10*3/uL — ABNORMAL LOW (ref 150–400)
Platelets: 57 10*3/uL — ABNORMAL LOW (ref 150–400)
RBC: 3.37 MIL/uL — ABNORMAL LOW (ref 3.87–5.11)
RBC: 3.1 MIL/uL — ABNORMAL LOW (ref 3.87–5.11)
RBC: 2.99 MIL/uL — ABNORMAL LOW (ref 3.87–5.11)
RDW: 15.1 % (ref 11.5–15.5)
RDW: 15.6 % — ABNORMAL HIGH (ref 11.5–15.5)
RDW: 15.4 % (ref 11.5–15.5)
WBC: 15.3 10*3/uL — ABNORMAL HIGH (ref 4.0–10.5)
WBC: 16.1 10*3/uL — ABNORMAL HIGH (ref 4.0–10.5)
WBC: 13.9 10*3/uL — ABNORMAL HIGH (ref 4.0–10.5)

## 2013-11-09 LAB — CARBOXYHEMOGLOBIN
Carboxyhemoglobin: 1.3 % (ref 0.5–1.5)
Carboxyhemoglobin: 1.3 % (ref 0.5–1.5)
Carboxyhemoglobin: 1 % (ref 0.5–1.5)
Methemoglobin: 1 % (ref 0.0–1.5)
Methemoglobin: 1.2 % (ref 0.0–1.5)
Methemoglobin: 1.2 % (ref 0.0–1.5)
O2 Saturation: 46.6 %
O2 Saturation: 53.6 %
O2 Saturation: 57.8 %
Total hemoglobin: 9.9 g/dL — ABNORMAL LOW (ref 12.0–16.0)
Total hemoglobin: 9.5 g/dL — ABNORMAL LOW (ref 12.0–16.0)
Total hemoglobin: 8.6 g/dL — ABNORMAL LOW (ref 12.0–16.0)

## 2013-11-09 LAB — PREPARE FRESH FROZEN PLASMA
Unit division: 0
Unit division: 0
Unit division: 0

## 2013-11-09 LAB — PREPARE PLATELET PHERESIS: Unit division: 0

## 2013-11-09 LAB — GLUCOSE, CAPILLARY
GLUCOSE-CAPILLARY: 147 mg/dL — AB (ref 70–99)
GLUCOSE-CAPILLARY: 104 mg/dL — AB (ref 70–99)
GLUCOSE-CAPILLARY: 111 mg/dL — AB (ref 70–99)
GLUCOSE-CAPILLARY: 145 mg/dL — AB (ref 70–99)
GLUCOSE-CAPILLARY: 143 mg/dL — AB (ref 70–99)
GLUCOSE-CAPILLARY: 158 mg/dL — AB (ref 70–99)
GLUCOSE-CAPILLARY: 147 mg/dL — AB (ref 70–99)
Glucose-Capillary: 109 mg/dL — ABNORMAL HIGH (ref 70–99)
Glucose-Capillary: 120 mg/dL — ABNORMAL HIGH (ref 70–99)
Glucose-Capillary: 128 mg/dL — ABNORMAL HIGH (ref 70–99)
Glucose-Capillary: 101 mg/dL — ABNORMAL HIGH (ref 70–99)
Glucose-Capillary: 105 mg/dL — ABNORMAL HIGH (ref 70–99)
Glucose-Capillary: 147 mg/dL — ABNORMAL HIGH (ref 70–99)
Glucose-Capillary: 96 mg/dL (ref 70–99)

## 2013-11-09 LAB — MAGNESIUM
MAGNESIUM: 2.4 mg/dL (ref 1.5–2.5)
Magnesium: 2.5 mg/dL (ref 1.5–2.5)

## 2013-11-09 MED ORDER — ALBUMIN HUMAN 5 % IV SOLN
12.5000 g | Freq: Once | INTRAVENOUS | Status: AC
  Administered 2013-11-09: 12.5 g via INTRAVENOUS

## 2013-11-09 MED ORDER — SODIUM BICARBONATE 8.4 % IV SOLN
50.0000 meq | Freq: Once | INTRAVENOUS | Status: AC
  Administered 2013-11-09: 50 meq via INTRAVENOUS

## 2013-11-09 MED ORDER — EPINEPHRINE HCL 1 MG/ML IJ SOLN
3.0000 ug/min | INTRAVENOUS | Status: DC
  Administered 2013-11-09: 3 ug/min via INTRAVENOUS
  Filled 2013-11-09: qty 1

## 2013-11-09 MED ORDER — DOCUSATE SODIUM 50 MG/5ML PO LIQD
200.0000 mg | Freq: Every day | ORAL | Status: DC
  Administered 2013-11-09 – 2013-11-20 (×9): 200 mg via ORAL
  Filled 2013-11-09 (×19): qty 20

## 2013-11-09 MED ORDER — CHLORHEXIDINE GLUCONATE 0.12 % MT SOLN
15.0000 mL | Freq: Two times a day (BID) | OROMUCOSAL | Status: DC
  Administered 2013-11-10: 15 mL via OROMUCOSAL
  Filled 2013-11-09 (×2): qty 15

## 2013-11-09 MED ORDER — FUROSEMIDE 10 MG/ML IJ SOLN
40.0000 mg | Freq: Once | INTRAMUSCULAR | Status: AC
  Administered 2013-11-09: 40 mg via INTRAVENOUS

## 2013-11-09 MED ORDER — DEXTROSE 5 % IV SOLN
2.0000 ug/min | INTRAVENOUS | Status: DC
  Administered 2013-11-09 – 2013-11-10 (×2): 9 ug/min via INTRAVENOUS
  Filled 2013-11-09 (×3): qty 16

## 2013-11-09 MED ORDER — ALBUMIN HUMAN 5 % IV SOLN
INTRAVENOUS | Status: AC
  Administered 2013-11-09: 250 mL via INTRAVENOUS
  Filled 2013-11-09: qty 250

## 2013-11-09 MED ORDER — AMIODARONE LOAD VIA INFUSION
150.0000 mg | Freq: Once | INTRAVENOUS | Status: AC
  Administered 2013-11-09: 150 mg via INTRAVENOUS
  Filled 2013-11-09: qty 83.34

## 2013-11-09 MED ORDER — SODIUM CHLORIDE 0.9 % IV SOLN: Freq: Once | INTRAVENOUS | Status: DC

## 2013-11-09 MED ORDER — DEXTROSE 5 % IV SOLN
3.0000 ug/min | INTRAVENOUS | Status: DC
  Administered 2013-11-09 (×2): 4 ug/min via INTRAVENOUS
  Administered 2013-11-10: 3.5 ug/min via INTRAVENOUS
  Filled 2013-11-09 (×4): qty 4

## 2013-11-09 MED ORDER — AMIODARONE HCL IN DEXTROSE 360-4.14 MG/200ML-% IV SOLN
60.0000 mg/h | INTRAVENOUS | Status: AC
  Administered 2013-11-09: 60 mg/h via INTRAVENOUS
  Filled 2013-11-09 (×2): qty 200

## 2013-11-09 MED ORDER — CETYLPYRIDINIUM CHLORIDE 0.05 % MT LIQD
7.0000 mL | Freq: Four times a day (QID) | OROMUCOSAL | Status: DC
  Administered 2013-11-09 – 2013-11-12 (×12): 7 mL via OROMUCOSAL

## 2013-11-09 MED ORDER — AMIODARONE HCL IN DEXTROSE 360-4.14 MG/200ML-% IV SOLN
30.0000 mg/h | INTRAVENOUS | Status: DC
  Administered 2013-11-09 – 2013-11-10 (×2): 30 mg/h via INTRAVENOUS
  Filled 2013-11-09 (×5): qty 200

## 2013-11-09 NOTE — Progress Notes (Signed)
INITIAL NUTRITION ASSESSMENT  DOCUMENTATION CODES Per approved criteria  -Obesity Unspecified   INTERVENTION:  If unable to extubate patient within the next 24 hours, recommend initiate TF via OGT with Vital High Protein at 20 ml/h, increase by 10 ml every 4 hours to goal rate of 60 ml/h with to provide 1440 kcals (23 kcals/kg ideal weight), 126 gm protein, 1204 ml free water daily.  NUTRITION DIAGNOSIS: Inadequate oral intake related to inability to eat as evidenced by NPO status.   Goal: Enteral nutrition to provide 60-70% of estimated calorie needs (22-25 kcals/kg ideal body weight) and 100% of estimated protein needs, based on ASPEN guidelines for permissive underfeeding in critically ill obese individuals.  Reason for Assessment: VDRF  80 y.o. female  Admitting Dx: chest pain and cough x 1 week  ASSESSMENT: 80 year old female with extensive cardiac history, admitted on 9/1 with chest pain. S/P CABGx2 on 9/4.  Plans to keep intubated due to borderline cardiac output. If unable to extubate patient within next 24 hours, recommend starting enteral nutrition via OGT.  Patient is currently intubated on ventilator support MV: 8.6 L/min Temp (24hrs), Avg:99 F (37.2 C), Min:96.1 F (35.6 C), Max:100 F (37.8 C)   Height: Ht Readings from Last 1 Encounters:  11/05/13 5\' 7"  (1.702 m)    Weight: Wt Readings from Last 1 Encounters:  11/09/13 216 lb 14.9 oz (98.4 kg)    Ideal Body Weight: 61.4 kg  % Ideal Body Weight: 160%  Wt Readings from Last 10 Encounters:  11/09/13 216 lb 14.9 oz (98.4 kg)  11/09/13 216 lb 14.9 oz (98.4 kg)  11/05/13 196 lb (88.905 kg)  11/09/13 216 lb 14.9 oz (98.4 kg)  08/05/13 188 lb (85.276 kg)  07/18/13 194 lb (87.998 kg)  06/28/13 191 lb 2.2 oz (86.7 kg)  06/28/13 191 lb 2.2 oz (86.7 kg)  02/11/13 191 lb (86.637 kg)  01/22/13 194 lb (87.998 kg)    Usual Body Weight: 191-196 lb  % Usual Body Weight: 111%  BMI:  Body mass index is  33.97 kg/(m^2).  Class 1 obesity  Estimated Nutritional Needs: Kcal: 1741 Protein: 123-153 gm Fluid: 1.7-1.9 L  Skin: surgical incisions to legs and chest  Diet Order: NPO  EDUCATION NEEDS: -Education not appropriate at this time   Intake/Output Summary (Last 24 hours) at 11/09/13 1325 Last data filed at 11/09/13 1234  Gross per 24 hour  Intake 10288.69 ml  Output   3195 ml  Net 7093.69 ml    Last BM: 9/1   Labs:   Recent Labs Lab 11/05/13 1040 11/08/13 0344  11/08/13 1446 11/08/13 1524 11/08/13 1758 11/08/13 2156 11/08/13 2201 11/09/13 0345  NA 129* 127*  < > 126* 131* 132*  --  133* 133*  K 5.0 4.9  < > 4.7 4.5 4.7  --  3.9 4.1  CL 93* 93*  < > 107 100  --   --   --  100  CO2 22 22  --   --   --   --   --   --  19  BUN 27* 30*  < > 23 25*  --   --   --  28*  CREATININE 1.57* 1.65*  < > 1.40* 1.50*  --  1.54*  --  1.73*  CALCIUM 8.7 8.7  --   --   --   --   --   --  9.1  MG  --   --   --   --   --   --  2.8*  --  2.5  GLUCOSE 260* 143*  < > 140* 198* 160*  --  140* 118*  < > = values in this interval not displayed.  CBG (last 3)   Recent Labs  11/09/13 0801 11/09/13 0935 11/09/13 1021  GLUCAP 158* 147* 147*    Scheduled Meds: . sodium chloride   Intravenous Once  . acetaminophen  1,000 mg Oral 4 times per day   Or  . acetaminophen (TYLENOL) oral liquid 160 mg/5 mL  1,000 mg Per Tube 4 times per day  . bisacodyl  10 mg Oral Daily   Or  . bisacodyl  10 mg Rectal Daily  . cefUROXime (ZINACEF)  IV  1.5 g Intravenous Q12H  . docusate  200 mg Oral Daily  . insulin regular  0-10 Units Intravenous TID WC  . metoprolol tartrate  12.5 mg Oral BID   Or  . metoprolol tartrate  12.5 mg Per Tube BID  . [START ON 11/10/2013] pantoprazole  40 mg Oral Daily  . sodium chloride  3 mL Intravenous Q12H    Continuous Infusions: . sodium chloride 20 mL/hr at 11/09/13 1200  . sodium chloride 20 mL/hr at 11/09/13 1200  . sodium chloride    . amiodarone 30 mg/hr  (11/09/13 1200)  . dexmedetomidine 0.7 mcg/kg/hr (11/09/13 1200)  . DOPamine 4.983 mcg/kg/min (11/09/13 1200)  . epinephrine 4 mcg/min (11/09/13 1200)  . insulin (NOVOLIN-R) infusion 5.8 Units/hr (11/09/13 1200)  . lactated ringers 20 mL/hr at 11/09/13 1200  . milrinone 0.377 mcg/kg/min (11/09/13 1200)  . norepinephrine (LEVOPHED) Adult infusion 9.013 mcg/min (11/09/13 1200)    Past Medical History  Diagnosis Date  . GERD (gastroesophageal reflux disease)   . PUD (peptic ulcer disease)   . Anemia   . Hypertension   . S/P endoscopy Aug 2011    3 superficial gastric ulcers, NSAID-induced  . S/P colonoscopy Sept 2011    left-sided diverticula, tubular adenoma  . Coronary artery disease   . Shortness of breath   . Diabetes mellitus     insulin dependent  . Headache(784.0)     rare migraines  . Cancer     hx of skin cancer  . Arthritis   . Osteopenia   . Hypercholesterolemia   . SIADH (syndrome of inappropriate ADH production)   . CHF (congestive heart failure)   . Myocardial infarction 2013    Past Surgical History  Procedure Laterality Date  . Tonsillectomy    . Appendectomy    . Eye surgery      cataracts  . Cardiac stents  07/19/2011  . Esophagogastroduodenoscopy  10/16/09    normal without barrett's/three superficial gastric ulcers  . Colonoscopy  12/04/09    normal rectum/left-sided diverticula  . Joint replacement  arthroscopy to knee  . Toe amputation Left 2013    left second toe amputation  . Cardiac catheterization  01/22/2013  . Coronary angioplasty  07/2011    Molli Barrows, RD, LDN, [place] Pager [phone] After Hours Pager [phone]

## 2013-11-09 NOTE — Progress Notes (Signed)
E[facility] Lab  Limited 2D Echocardiogram completed.  Vee Bahe L Keziah Drotar, RDCS 11/09/2013 9:12 AM

## 2013-11-09 NOTE — Progress Notes (Signed)
1 Day Post-Op Procedure(s) (LRB): CORONARY ARTERY BYPASS GRAFTING (CABG), on pump, times two, using left internal mammary artery, cryo saphenous vein. (N/A) INTRAOPERATIVE TRANSESOPHAGEAL ECHOCARDIOGRAM (N/A) MITRAL VALVE (MV) REPLACEMENT (N/A)  Status post CABG x2 for unstable angina, non-ST elevation MI, combined MVR with pericardial tissue valve Severe mitral regurgitation with severe pulmonary hypertension, preoperative acute respiratory failure requiring BiPAP LVH, diastolic dysfunction  Perioperative allergic transfusion reaction to platelets with low SVR Postoperative coagulopathy related to preoperative Plavix,vorapaxar  Patient currently intubated sedated on moderate doses of dopamine, milrinone, epinephrine. Cardiac index 1.6, co-ox this a.m. 54%. Arterial blood gases improved and chest x-ray shows mild perihilar edema. Patient in a sinus rhythm atrially paced. PA pressures remained low on inhaled nitric oxide Urine output is 30 cc per hour, BUN/creatinine moderately increased from baseline 1.5 creatinine  Objective: Vital signs in last 24 hours: Temp:  [96.1 F (35.6 C)-100 F (37.8 C)] 98.8 F (37.1 C) (09/05 0800) Pulse Rate:  [66-92] 90 (09/05 0800) Cardiac Rhythm:  [-] Atrial paced (09/05 0800) Resp:  [12-28] 16 (09/05 0800) BP: (83-126)/(35-62) 104/50 mmHg (09/05 0800) SpO2:  [99 %-100 %] 100 % (09/05 0800) Arterial Line BP: (89-142)/(35-60) 114/43 mmHg (09/05 0800) FiO2 (%):  [50 %-100 %] 50 % (09/05 0800) Weight:  [191 lb 2.2 oz (86.7 kg)-216 lb 14.9 oz (98.4 kg)] 216 lb 14.9 oz (98.4 kg) (09/05 0200)  Hemodynamic parameters for last 24 hours: PAP: (26-52)/(13-24) 28/15 mmHg CVP:  [10 mmHg-22 mmHg] 10 mmHg CO:  [2.6 L/min-4.1 L/min] 3 L/min CI:  [1.3 L/min/m2-2.1 L/min/m2] 1.5 L/min/m2  Intake/Output from previous day: 09/04 0701 - 09/05 0700 In: 11629.6 [I.V.:7494.6; TDVVO:1607; NG/GT:160; IV Piggyback:1800] Out: 3350 [Urine:1830; Emesis/NG output:200; Chest  Tube:1320] Intake/Output this shift: Total I/O In: 524.4 [I.V.:474.4; IV Piggyback:50] Out: 260 [Urine:90; Emesis/NG output:50; Chest Tube:120]  Exam Edematous Breath sounds slightly coarse Lower extremities cool but with Doppler pulses Chest tube drainage minimal Sedated  Lab Results:  Recent Labs  11/08/13 2156 11/08/13 2201 11/09/13 0345  WBC 10.3  --  15.3*  HGB 10.6* 10.2* 10.0*  HCT 29.9* 30.0* 28.1*  PLT 87*  --  82*   BMET:  Recent Labs  11/08/13 0344  11/08/13 1524  11/08/13 2156 11/08/13 2201 11/09/13 0345  NA 127*  < > 131*  < >  --  133* 133*  K 4.9  < > 4.5  < >  --  3.9 4.1  CL 93*  < > 100  --   --   --  100  CO2 22  --   --   --   --   --  19  GLUCOSE 143*  < > 198*  < >  --  140* 118*  BUN 30*  < > 25*  --   --   --  28*  CREATININE 1.65*  < > 1.50*  --  1.54*  --  1.73*  CALCIUM 8.7  --   --   --   --   --  9.1  < > = values in this interval not displayed.  PT/INR:  Recent Labs  11/08/13 1700  LABPROT 22.9*  INR 2.02*   ABG    Component Value Date/Time   PHART 7.423 11/09/2013 0351   HCO3 20.5 11/09/2013 0351   TCO2 21 11/09/2013 0351   ACIDBASEDEF 3.0* 11/09/2013 0351   O2SAT 53.6 11/09/2013 0848   CBG (last 3)   Recent Labs  11/08/13 2110 11/08/13 2257 11/08/13 2350  GLUCAP  104* 120* 109*    Assessment/Plan: S/P Procedure(s) (LRB): CORONARY ARTERY BYPASS GRAFTING (CABG), on pump, times two, using left internal mammary artery, cryo saphenous vein. (N/A) INTRAOPERATIVE TRANSESOPHAGEAL ECHOCARDIOGRAM (N/A) MITRAL VALVE (MV) REPLACEMENT (N/A) Perioperative coagulopathy with severe transfusion reaction Echocardiogram today shows preserved biventricular function with normal mitral valve function without MR, no TR, no pericardial effusion-cardiac tamponade  Because of borderline cardiac output we'll leave the patient intubated and continue nitric oxide to optimize RV function. Follow serial mixed venous saturation. Treat postoperative  acute blood loss expected anemia to maintain hemoglobin greater than 8.0   LOS: 4 days    VAN TRIGT III,[place] 11/09/2013

## 2013-11-09 NOTE — Progress Notes (Signed)
CT Surgery  R central line placed for iv access Pt weaning nitric oxide Patient responsive on vent

## 2013-11-10 ENCOUNTER — Inpatient Hospital Stay (HOSPITAL_COMMUNITY): Payer: Medicare HMO

## 2013-11-10 LAB — BASIC METABOLIC PANEL
Anion gap: 18 — ABNORMAL HIGH (ref 5–15)
BUN: 38 mg/dL — ABNORMAL HIGH (ref 6–23)
CO2: 18 mEq/L — ABNORMAL LOW (ref 19–32)
Calcium: 8.3 mg/dL — ABNORMAL LOW (ref 8.4–10.5)
Chloride: 97 mEq/L (ref 96–112)
Creatinine, Ser: 3.06 mg/dL — ABNORMAL HIGH (ref 0.50–1.10)
GFR calc Af Amer: 16 mL/min — ABNORMAL LOW (ref >90)
GFR calc non Af Amer: 13 mL/min — ABNORMAL LOW (ref >90)
Glucose, Bld: 177 mg/dL — ABNORMAL HIGH (ref 70–99)
Potassium: 5.2 mEq/L (ref 3.7–5.3)
Sodium: 133 mEq/L — ABNORMAL LOW (ref 137–147)

## 2013-11-10 LAB — GLUCOSE, CAPILLARY
GLUCOSE-CAPILLARY: 107 mg/dL — AB (ref 70–99)
GLUCOSE-CAPILLARY: 110 mg/dL — AB (ref 70–99)
GLUCOSE-CAPILLARY: 111 mg/dL — AB (ref 70–99)
GLUCOSE-CAPILLARY: 113 mg/dL — AB (ref 70–99)
GLUCOSE-CAPILLARY: 119 mg/dL — AB (ref 70–99)
GLUCOSE-CAPILLARY: 135 mg/dL — AB (ref 70–99)
GLUCOSE-CAPILLARY: 140 mg/dL — AB (ref 70–99)
GLUCOSE-CAPILLARY: 148 mg/dL — AB (ref 70–99)
GLUCOSE-CAPILLARY: 160 mg/dL — AB (ref 70–99)
GLUCOSE-CAPILLARY: 97 mg/dL (ref 70–99)
Glucose-Capillary: 107 mg/dL — ABNORMAL HIGH (ref 70–99)
Glucose-Capillary: 108 mg/dL — ABNORMAL HIGH (ref 70–99)
Glucose-Capillary: 120 mg/dL — ABNORMAL HIGH (ref 70–99)
Glucose-Capillary: 121 mg/dL — ABNORMAL HIGH (ref 70–99)
Glucose-Capillary: 123 mg/dL — ABNORMAL HIGH (ref 70–99)
Glucose-Capillary: 127 mg/dL — ABNORMAL HIGH (ref 70–99)
Glucose-Capillary: 128 mg/dL — ABNORMAL HIGH (ref 70–99)
Glucose-Capillary: 128 mg/dL — ABNORMAL HIGH (ref 70–99)
Glucose-Capillary: 132 mg/dL — ABNORMAL HIGH (ref 70–99)
Glucose-Capillary: 137 mg/dL — ABNORMAL HIGH (ref 70–99)
Glucose-Capillary: 138 mg/dL — ABNORMAL HIGH (ref 70–99)
Glucose-Capillary: 140 mg/dL — ABNORMAL HIGH (ref 70–99)
Glucose-Capillary: 141 mg/dL — ABNORMAL HIGH (ref 70–99)
Glucose-Capillary: 145 mg/dL — ABNORMAL HIGH (ref 70–99)
Glucose-Capillary: 147 mg/dL — ABNORMAL HIGH (ref 70–99)
Glucose-Capillary: 151 mg/dL — ABNORMAL HIGH (ref 70–99)
Glucose-Capillary: 152 mg/dL — ABNORMAL HIGH (ref 70–99)
Glucose-Capillary: 175 mg/dL — ABNORMAL HIGH (ref 70–99)
Glucose-Capillary: 94 mg/dL (ref 70–99)

## 2013-11-10 LAB — POCT I-STAT 3, ART BLOOD GAS (G3+)
ACID-BASE DEFICIT: 5 mmol/L — AB (ref 0.0–2.0)
ACID-BASE DEFICIT: 4 mmol/L — AB (ref 0.0–2.0)
Acid-base deficit: 3 mmol/L — ABNORMAL HIGH (ref 0.0–2.0)
Acid-base deficit: 4 mmol/L — ABNORMAL HIGH (ref 0.0–2.0)
Acid-base deficit: 7 mmol/L — ABNORMAL HIGH (ref 0.0–2.0)
BICARBONATE: 18.6 meq/L — AB (ref 20.0–24.0)
Bicarbonate: 21.1 mEq/L (ref 20.0–24.0)
Bicarbonate: 21.1 mEq/L (ref 20.0–24.0)
Bicarbonate: 21.2 mEq/L (ref 20.0–24.0)
Bicarbonate: 18.7 mEq/L — ABNORMAL LOW (ref 20.0–24.0)
O2 Saturation: 98 %
O2 Saturation: 98 %
O2 Saturation: 98 %
O2 Saturation: 95 %
O2 Saturation: 92 %
PCO2 ART: 35.7 mmHg (ref 35.0–45.0)
PCO2 ART: 38.5 mmHg (ref 35.0–45.0)
PCO2 ART: 36 mmHg (ref 35.0–45.0)
PH ART: 7.32 — AB (ref 7.350–7.450)
PO2 ART: 101 mmHg — AB (ref 80.0–100.0)
PO2 ART: 64 mmHg — AB (ref 80.0–100.0)
Patient temperature: 37.2
Patient temperature: 37.2
Patient temperature: 36.9
Patient temperature: 36.3
TCO2: 22 mmol/L (ref 0–100)
TCO2: 22 mmol/L (ref 0–100)
TCO2: 19 mmol/L (ref 0–100)
TCO2: 22 mmol/L (ref 0–100)
TCO2: 20 mmol/L (ref 0–100)
pCO2 arterial: 28.8 mmHg — ABNORMAL LOW (ref 35.0–45.0)
pCO2 arterial: 38.4 mmHg (ref 35.0–45.0)
pH, Arterial: 7.381 (ref 7.350–7.450)
pH, Arterial: 7.347 — ABNORMAL LOW (ref 7.350–7.450)
pH, Arterial: 7.418 (ref 7.350–7.450)
pH, Arterial: 7.349 — ABNORMAL LOW (ref 7.350–7.450)
pO2, Arterial: 117 mmHg — ABNORMAL HIGH (ref 80.0–100.0)
pO2, Arterial: 100 mmHg (ref 80.0–100.0)
pO2, Arterial: 80 mmHg (ref 80.0–100.0)

## 2013-11-10 LAB — CBC
HCT: 26 % — ABNORMAL LOW (ref 36.0–46.0)
HCT: 24.6 % — ABNORMAL LOW (ref 36.0–46.0)
Hemoglobin: 9.3 g/dL — ABNORMAL LOW (ref 12.0–15.0)
Hemoglobin: 8.7 g/dL — ABNORMAL LOW (ref 12.0–15.0)
MCH: 29.7 pg (ref 26.0–34.0)
MCH: 30.4 pg (ref 26.0–34.0)
MCHC: 35.8 g/dL (ref 30.0–36.0)
MCHC: 35.4 g/dL (ref 30.0–36.0)
MCV: 83.1 fL (ref 78.0–100.0)
MCV: 86 fL (ref 78.0–100.0)
Platelets: 62 10*3/uL — ABNORMAL LOW (ref 150–400)
Platelets: 60 10*3/uL — ABNORMAL LOW (ref 150–400)
RBC: 3.13 MIL/uL — ABNORMAL LOW (ref 3.87–5.11)
RBC: 2.86 MIL/uL — ABNORMAL LOW (ref 3.87–5.11)
RDW: 15.7 % — ABNORMAL HIGH (ref 11.5–15.5)
RDW: 16 % — ABNORMAL HIGH (ref 11.5–15.5)
WBC: 14.5 10*3/uL — ABNORMAL HIGH (ref 4.0–10.5)
WBC: 15.1 10*3/uL — ABNORMAL HIGH (ref 4.0–10.5)

## 2013-11-10 LAB — COMPREHENSIVE METABOLIC PANEL
ALT: 20 U/L (ref 0–35)
AST: 43 U/L — ABNORMAL HIGH (ref 0–37)
Albumin: 2.8 g/dL — ABNORMAL LOW (ref 3.5–5.2)
Alkaline Phosphatase: 37 U/L — ABNORMAL LOW (ref 39–117)
Anion gap: 16 — ABNORMAL HIGH (ref 5–15)
BUN: 34 mg/dL — ABNORMAL HIGH (ref 6–23)
CO2: 19 mEq/L (ref 19–32)
Calcium: 8.5 mg/dL (ref 8.4–10.5)
Chloride: 97 mEq/L (ref 96–112)
Creatinine, Ser: 2.59 mg/dL — ABNORMAL HIGH (ref 0.50–1.10)
GFR calc Af Amer: 19 mL/min — ABNORMAL LOW (ref >90)
GFR calc non Af Amer: 17 mL/min — ABNORMAL LOW (ref >90)
Glucose, Bld: 118 mg/dL — ABNORMAL HIGH (ref 70–99)
Potassium: 4.9 mEq/L (ref 3.7–5.3)
Sodium: 132 mEq/L — ABNORMAL LOW (ref 137–147)
Total Bilirubin: 1.1 mg/dL (ref 0.3–1.2)
Total Protein: 4.6 g/dL — ABNORMAL LOW (ref 6.0–8.3)

## 2013-11-10 LAB — CARBOXYHEMOGLOBIN
Carboxyhemoglobin: 1.3 % (ref 0.5–1.5)
Carboxyhemoglobin: 1 % (ref 0.5–1.5)
Methemoglobin: 0.4 % (ref 0.0–1.5)
Methemoglobin: 0.7 % (ref 0.0–1.5)
O2 Saturation: 73 %
O2 Saturation: 62.5 %
Total hemoglobin: 9.2 g/dL — ABNORMAL LOW (ref 12.0–16.0)
Total hemoglobin: 9.1 g/dL — ABNORMAL LOW (ref 12.0–16.0)

## 2013-11-10 MED ORDER — FUROSEMIDE 10 MG/ML IJ SOLN
40.0000 mg | Freq: Once | INTRAMUSCULAR | Status: AC
  Administered 2013-11-10: 40 mg via INTRAVENOUS
  Filled 2013-11-10: qty 4

## 2013-11-10 MED ORDER — INSULIN ASPART 100 UNIT/ML ~~LOC~~ SOLN
0.0000 [IU] | SUBCUTANEOUS | Status: DC
  Administered 2013-11-10 (×3): 2 [IU] via SUBCUTANEOUS
  Administered 2013-11-10: 4 [IU] via SUBCUTANEOUS
  Administered 2013-11-11 – 2013-11-14 (×6): 2 [IU] via SUBCUTANEOUS
  Administered 2013-11-14: 8 [IU] via SUBCUTANEOUS
  Administered 2013-11-14: 2 [IU] via SUBCUTANEOUS
  Administered 2013-11-14: 4 [IU] via SUBCUTANEOUS
  Administered 2013-11-14: 8 [IU] via SUBCUTANEOUS
  Administered 2013-11-15: 2 [IU] via SUBCUTANEOUS
  Administered 2013-11-15: 4 [IU] via SUBCUTANEOUS
  Administered 2013-11-15: 2 [IU] via SUBCUTANEOUS
  Administered 2013-11-15: 4 [IU] via SUBCUTANEOUS
  Administered 2013-11-15: 2 [IU] via SUBCUTANEOUS
  Administered 2013-11-16: 8 [IU] via SUBCUTANEOUS
  Administered 2013-11-16: 4 [IU] via SUBCUTANEOUS
  Administered 2013-11-16: 2 [IU] via SUBCUTANEOUS
  Administered 2013-11-16: 4 [IU] via SUBCUTANEOUS
  Administered 2013-11-16 – 2013-11-17 (×2): 2 [IU] via SUBCUTANEOUS
  Administered 2013-11-17: 4 [IU] via SUBCUTANEOUS
  Administered 2013-11-17 (×2): 2 [IU] via SUBCUTANEOUS
  Administered 2013-11-17: 4 [IU] via SUBCUTANEOUS
  Administered 2013-11-18 (×2): 2 [IU] via SUBCUTANEOUS
  Administered 2013-11-18: 8 [IU] via SUBCUTANEOUS
  Administered 2013-11-18 – 2013-11-19 (×3): 4 [IU] via SUBCUTANEOUS
  Administered 2013-11-19 (×3): 2 [IU] via SUBCUTANEOUS
  Administered 2013-11-19 (×2): 4 [IU] via SUBCUTANEOUS
  Administered 2013-11-20 (×2): 2 [IU] via SUBCUTANEOUS
  Administered 2013-11-20: 4 [IU] via SUBCUTANEOUS
  Administered 2013-11-20: 2 [IU] via SUBCUTANEOUS
  Administered 2013-11-20: 8 [IU] via SUBCUTANEOUS
  Administered 2013-11-21: 2 [IU] via SUBCUTANEOUS
  Administered 2013-11-21 (×2): 4 [IU] via SUBCUTANEOUS
  Administered 2013-11-21 (×2): 2 [IU] via SUBCUTANEOUS
  Administered 2013-11-22: 4 [IU] via SUBCUTANEOUS
  Administered 2013-11-22 (×4): 2 [IU] via SUBCUTANEOUS
  Administered 2013-11-22: 4 [IU] via SUBCUTANEOUS
  Administered 2013-11-23: 8 [IU] via SUBCUTANEOUS
  Administered 2013-11-23: 2 [IU] via SUBCUTANEOUS
  Administered 2013-11-23 (×2): 4 [IU] via SUBCUTANEOUS
  Administered 2013-11-23: 2 [IU] via SUBCUTANEOUS
  Administered 2013-11-24 (×2): 4 [IU] via SUBCUTANEOUS
  Administered 2013-11-24 (×2): 8 [IU] via SUBCUTANEOUS
  Administered 2013-11-24: 2 [IU] via SUBCUTANEOUS
  Administered 2013-11-24 – 2013-11-25 (×2): 4 [IU] via SUBCUTANEOUS
  Administered 2013-11-25 (×3): 2 [IU] via SUBCUTANEOUS
  Administered 2013-11-25: 4 [IU] via SUBCUTANEOUS
  Administered 2013-11-25: 2 [IU] via SUBCUTANEOUS
  Administered 2013-11-26: 8 [IU] via SUBCUTANEOUS
  Administered 2013-11-26 (×2): 2 [IU] via SUBCUTANEOUS
  Administered 2013-11-26: 8 [IU] via SUBCUTANEOUS
  Administered 2013-11-27: 2 [IU] via SUBCUTANEOUS
  Administered 2013-11-27 (×2): 4 [IU] via SUBCUTANEOUS
  Administered 2013-11-27 – 2013-11-28 (×4): 2 [IU] via SUBCUTANEOUS
  Administered 2013-11-28 – 2013-11-29 (×5): 4 [IU] via SUBCUTANEOUS

## 2013-11-10 MED ORDER — TRAMADOL HCL 50 MG PO TABS
50.0000 mg | ORAL_TABLET | Freq: Four times a day (QID) | ORAL | Status: DC | PRN
  Administered 2013-11-14 – 2013-12-05 (×19): 50 mg via ORAL
  Filled 2013-11-10 (×20): qty 1

## 2013-11-10 MED ORDER — ALBUMIN HUMAN 5 % IV SOLN
INTRAVENOUS | Status: AC
  Filled 2013-11-10: qty 250

## 2013-11-10 MED ORDER — FENTANYL CITRATE 0.05 MG/ML IJ SOLN: 12.5000 ug | INTRAMUSCULAR | Status: DC | PRN

## 2013-11-10 MED ORDER — INSULIN DETEMIR 100 UNIT/ML ~~LOC~~ SOLN
14.0000 [IU] | Freq: Two times a day (BID) | SUBCUTANEOUS | Status: DC
Start: 2013-11-10 — End: 2013-11-10
  Filled 2013-11-10: qty 0.14

## 2013-11-10 MED ORDER — PROMETHAZINE HCL 25 MG/ML IJ SOLN
12.5000 mg | INTRAMUSCULAR | Status: DC | PRN
  Filled 2013-11-10: qty 1

## 2013-11-10 MED ORDER — INSULIN DETEMIR 100 UNIT/ML ~~LOC~~ SOLN
14.0000 [IU] | Freq: Once | SUBCUTANEOUS | Status: AC
  Administered 2013-11-10: 14 [IU] via SUBCUTANEOUS
  Filled 2013-11-10: qty 0.14

## 2013-11-10 MED ORDER — METOCLOPRAMIDE HCL 5 MG/ML IJ SOLN
10.0000 mg | Freq: Four times a day (QID) | INTRAMUSCULAR | Status: AC
  Administered 2013-11-10 – 2013-11-12 (×10): 10 mg via INTRAVENOUS
  Filled 2013-11-10 (×11): qty 2

## 2013-11-10 MED ORDER — ALBUMIN HUMAN 5 % IV SOLN
12.5000 g | Freq: Once | INTRAVENOUS | Status: AC
  Administered 2013-11-10: 12.5 g via INTRAVENOUS

## 2013-11-10 MED ORDER — DOPAMINE-DEXTROSE 3.2-5 MG/ML-% IV SOLN
5.0000 ug/kg/min | INTRAVENOUS | Status: DC
  Administered 2013-11-11 – 2013-11-14 (×3): 3 ug/kg/min via INTRAVENOUS
  Filled 2013-11-10 (×4): qty 250

## 2013-11-10 MED ORDER — SODIUM BICARBONATE 8.4 % IV SOLN
INTRAVENOUS | Status: AC
  Filled 2013-11-10: qty 50

## 2013-11-10 MED ORDER — MIDAZOLAM HCL 2 MG/2ML IJ SOLN: 1.0000 mg | INTRAMUSCULAR | Status: DC | PRN

## 2013-11-10 MED ORDER — DEXTROSE 5 % IV SOLN
1.0000 g | INTRAVENOUS | Status: DC
  Administered 2013-11-10 – 2013-11-20 (×11): 1 g via INTRAVENOUS
  Filled 2013-11-10 (×12): qty 1

## 2013-11-10 MED ORDER — AMIODARONE LOAD VIA INFUSION
150.0000 mg | Freq: Once | INTRAVENOUS | Status: AC
  Administered 2013-11-10: 150 mg via INTRAVENOUS
  Filled 2013-11-10: qty 83.34

## 2013-11-10 MED ORDER — OXYCODONE HCL 5 MG PO TABS: 5.0000 mg | ORAL_TABLET | Freq: Four times a day (QID) | ORAL | Status: DC | PRN

## 2013-11-10 MED ORDER — INSULIN DETEMIR 100 UNIT/ML ~~LOC~~ SOLN
20.0000 [IU] | Freq: Two times a day (BID) | SUBCUTANEOUS | Status: DC
  Administered 2013-11-10 – 2013-11-11 (×3): 20 [IU] via SUBCUTANEOUS
  Filled 2013-11-10 (×5): qty 0.2

## 2013-11-10 MED ORDER — AMIODARONE HCL IN DEXTROSE 360-4.14 MG/200ML-% IV SOLN
60.0000 mg/h | INTRAVENOUS | Status: DC
  Administered 2013-11-10 – 2013-11-11 (×2): 60 mg/h via INTRAVENOUS
  Filled 2013-11-10 (×5): qty 200

## 2013-11-10 MED ORDER — SODIUM BICARBONATE 8.4 % IV SOLN
50.0000 meq | Freq: Once | INTRAVENOUS | Status: AC
  Administered 2013-11-10: 50 meq via INTRAVENOUS

## 2013-11-10 MED ORDER — PANTOPRAZOLE SODIUM 40 MG IV SOLR
40.0000 mg | INTRAVENOUS | Status: DC
  Administered 2013-11-10 – 2013-11-23 (×14): 40 mg via INTRAVENOUS
  Filled 2013-11-10 (×18): qty 40

## 2013-11-10 NOTE — Procedures (Signed)
Extubation Procedure Note  Patient Details:   Name: Claudia [name] DOB: [birth year] MRN: [id number]   Airway Documentation:     Evaluation  O2 sats: stable throughout Complications: No apparent complications Patient did tolerate procedure well. Bilateral Breath Sounds: Clear;Diminished Suctioning: Airway Yes  Tolerated wean well, VC 1.0L, NIF -20, positive for cuff leak. Extubated to 4L . No dyspnea or stridor noted after extubation. Pt achieved 744mL IS x 5. Pt is resting comfortably and all vitals are within normal limits.   Mariam Dollar 11/10/2013, 9:43 AM

## 2013-11-10 NOTE — Progress Notes (Signed)
This note also relates to the following rows which could not be included: Temp - Cannot attach notes to unvalidated device data Pulse Rate - Cannot attach notes to unvalidated device data ECG Heart Rate - Cannot attach notes to unvalidated device data Resp - Cannot attach notes to unvalidated device data SpO2 - Cannot attach notes to unvalidated device data Arterial Line BP 2 - Cannot attach notes to unvalidated device data Arterial Line MAP (mmHg) - Cannot attach notes to unvalidated device data PAP - Cannot attach notes to unvalidated device data PAP (Mean) - Cannot attach notes to unvalidated device data CVP (mmHg) - Cannot attach notes to unvalidated device data   Afib rate 120-140s MD Prescott Gum notified. Orders for Amio 150 bolus and continuous infusion rate increased to 30. SBP 70-80s. Levophed increased to 9. Will cont to monitor pt.

## 2013-11-10 NOTE — Progress Notes (Signed)
2 Days Post-Op Procedure(s) (LRB): CORONARY ARTERY BYPASS GRAFTING (CABG), on pump, times two, using left internal mammary artery, cryo saphenous vein. (N/A) INTRAOPERATIVE TRANSESOPHAGEAL ECHOCARDIOGRAM (N/A) MITRAL VALVE (MV) REPLACEMENT (N/A) Subjective: CABGx2 with LIMA, cryovein MVR with pericardial valve for severe MR Coagulopathy with transfusion reaction- postop low plts Acute on chronic renal failure- ATN Now extubated- neuro intact Cont low dose dopamine, milrinone and norepi Objective: Vital signs in last 24 hours: Temp:  [98.1 F (36.7 C)-99.1 F (37.3 C)] 98.8 F (37.1 C) (09/06 0800) Pulse Rate:  [89-92] 91 (09/06 0800) Cardiac Rhythm:  [-] Atrial paced (09/06 0800) Resp:  [0-27] 22 (09/06 0800) BP: (87-116)/(31-54) 114/46 mmHg (09/06 0800) SpO2:  [100 %] 100 % (09/06 0800) Arterial Line BP: (78-123)/(37-55) 121/38 mmHg (09/06 0800) FiO2 (%):  [40 %-50 %] 40 % (09/06 0800) Weight:  [229 lb 4.5 oz (104 kg)] 229 lb 4.5 oz (104 kg) (09/06 0630)  Hemodynamic parameters for last 24 hours: PAP: (23-34)/(12-23) 25/14 mmHg CVP:  [8 mmHg-38 mmHg] 10 mmHg CO:  [3 L/min-4.1 L/min] 4.1 L/min CI:  [1.5 L/min/m2-2.1 L/min/m2] 2.1 L/min/m2  Intake/Output from previous day: 09/05 0701 - 09/06 0700 In: 3663.6 [I.V.:3213.6; Blood:350; IV Piggyback:100] Out: 1577 [Urine:527; Emesis/NG output:530; Chest Tube:520] Intake/Output this shift: Total I/O In: 96.6 [I.V.:96.6] Out: 35 [Urine:15; Chest Tube:20]  Alert and responsive Lungs clear extrem warm  Lab Results:  Recent Labs  11/09/13 2045 11/10/13 0400  WBC 13.9* 14.5*  HGB 9.0* 9.3*  HCT 25.0* 26.0*  PLT 57* 62*   BMET:  Recent Labs  11/09/13 2045 11/10/13 0400  NA 129* 132*  K 4.7 4.9  CL 94* 97  CO2 20 19  GLUCOSE 149* 118*  BUN 32* 34*  CREATININE 2.30* 2.59*  CALCIUM 8.5 8.5    PT/INR:  Recent Labs  11/08/13 1700  LABPROT 22.9*  INR 2.02*   ABG    Component Value Date/Time   PHART 7.347*  11/10/2013 0357   HCO3 21.1 11/10/2013 0357   TCO2 22 11/10/2013 0357   ACIDBASEDEF 4.0* 11/10/2013 0357   O2SAT 62.5 11/10/2013 0819   CBG (last 3)   Recent Labs  11/09/13 2252 11/09/13 2352 11/10/13 0058  GLUCAP 113* 107* 107*    Assessment/Plan: S/P Procedure(s) (LRB): CORONARY ARTERY BYPASS GRAFTING (CABG), on pump, times two, using left internal mammary artery, cryo saphenous vein. (N/A) INTRAOPERATIVE TRANSESOPHAGEAL ECHOCARDIOGRAM (N/A) MITRAL VALVE (MV) REPLACEMENT (N/A) Rising creat- maintain urine output with lasix extubate and wean drips as tolerated Resume plavix later for cryovein Hold all heparin for possible HIT  LOS: 5 days    VAN TRIGT III,Eddison Searls 11/10/2013

## 2013-11-10 NOTE — Op Note (Signed)
NAMECARMELIA, [name] NO.:  [id number]  MEDICAL RECORD NO.:  [id number]  LOCATION:  2S05C                        FACILITY:  [facility]  PHYSICIAN:  Ivin Poot, M.D.  DATE OF BIRTH:  11-23-1931  DATE OF PROCEDURE:  11/08/2013 DATE OF DISCHARGE:                              OPERATIVE REPORT   OPERATION: 1. Coronary artery bypass grafting x2 (left left internal mammary to     obtuse marginal 2, saphenous vein graft to right coronary artery). 2. Attempted harvest of bilateral saphenous vein -- no suitable vein     for conduit was found. 3. Cryopreserved homograft vein utilized for conduit. 4. Attempted mitral valve ring annuloplasty repair followed by mitral     valve replacement with a 25-mm pericardial tissue valve [facility]     Mitral Magna Ease valve, serial [id number]). 5. Placement of femoral A-line for blood pressure monitoring.  PREOPERATIVE DIAGNOSES: 1. Unstable angina, severe 2-vessel coronary artery disease,     subendocardial myocardial infarction. 2. Severe mitral regurgitation with pulmonary hypertension and acute     respiratory insufficiency requiring BiPAP. 3. Chronic anticoagulation with Plavix and vorapaxar with resultant     perioperative coagulopathy and subsequent transfusion reaction.  POSTOPERATIVE DIAGNOSES: 1. Unstable angina, severe 2-vessel coronary artery disease,     subendocardial myocardial infarction. 2. Severe mitral regurgitation with pulmonary hypertension and acute     respiratory insufficiency requiring BiPAP. 3. Chronic anticoagulation with Plavix and vorapaxar with resultant     perioperative coagulopathy and subsequent transfusion reaction.  SURGEON:  Ivin Poot, M.D.  ASSISTANT:  John Giovanni, PA-C.  ANESTHESIA:  General by Dr. Kate Sable.  INDICATIONS:  The patient is a 80 year old diabetic who presented with acute respiratory distress, positive cardiac enzymes, and was found to have a non-ST elevation  MI.  She has a long history of severe coronary artery disease treated with PCI to her circumflex and ramus intermediate.  At this hospitalization, it was determined that her circumflex stent had restenosed for least the second time and further PCI was not felt to be indicated.  Surgical consultation was obtained. The patient also was noted to have significant mitral regurgitation, which by repeat current echo showed to be at least moderate to severe.  The patient was placed on heparin and allowed a Plavix washout. However, the vorapaxar washout would take 10-14 days.  The patient continued to have resting angina despite IV heparin, which required nitroglycerin and for that reason, we proceeded with CABG with combined mitral valve repair/replacement.  Prior to surgery, I discussed the procedure in detail with the patient and her family.  She understood that because of her chronic preoperative anemia and her use of antiplatelet double-agent inhibition that she would be expected to require blood transfusion requirements.  She also understood in addition to the risk of bleeding, there is a risk of stroke, MI, infection, pleural effusion, arrhythmia, and death.  She agreed to proceed with surgery under what I felt was an informed consent.  OPERATIVE FINDINGS: 1. Severe LVH. 2. Poor quality saphenous vein, which was explored in each leg and was     found to be inadequate for grafting, which necessitated  the use of     a cryopreserved human allograft vein. 3. Severe mitral regurgitation with severe pulmonary hypertension.     Attempted mitral valve annuloplasty repair was initially performed,     which improved the MR, but did not correct the MR.  So, a valve     replacement was performed. 4. Intraoperative coagulopathy from her pre-existing dual-antiplatelet     agent with a resultant transfusion reaction from probable platelet     transfusion requirement.  OPERATIVE PROCEDURE:  The  patient was brought to the operating room, placed supine on the operating table.  General anesthesia was induced under invasive hemodynamic monitoring.  She was noted to have severe pulmonary hypertension.  The transesophageal echo probe was placed by the anesthesiologist.  This demonstrated severe mitral regurgitation. The mitral valve had some calcification in both the posterior leaflet and the anterior leaflet.  The aortic valve appeared to be without significant disease.  LV function was mildly reduced.  There was no significant TR.  The patient was then prepped and draped as a sterile field.  A proper time-out was performed.  A sternal incision was made as the legs were explored bilaterally for adequate vein.  The veins were exposed and endoscopically dissected in the right leg, but were too small and scarred.  The vein was exposed on the left leg and was too small and scarred.  The vein was then exposed with an open technique in the left lower extremity and again was small and scarred and inadequate for conduit.  These incisions were then closed.  The patient had the internal mammary artery harvested using standard technique as a pedicle graft.  During this time, the cryopreserved human allograft vein was prepared.  A sternal retractor was placed and the pericardium was opened. Pursestrings were placed in the ascending aorta and right atrium and heparin was administered.  When the ACT was documented this being therapeutic, the patient was cannulated and placed on cardiopulmonary bypass.  A second pursestring was placed in the inferior aspect of the right atrium for bicaval venous drainage.  Bicaval tapes were placed. The OM2 vessel was dissected and found to be inadequate target.  The chronically occluded RCA was inspected and the RV branch was found to be inadequate target.  The distal posterior descending was too small to graft.  Cardioplegia cannulas were placed both  antegrade aortic and retrograde coronary sinus cardioplegia.  The patient was cooled to 32 degrees.  An A-line for CO2 insufflation of the operative field was placed at the upper part of the sternal incision.  The aortic crossclamp was applied and 1 L of cold blood cardioplegia was delivered between the antegrade aortic and retrograde coronary sinus catheters.  There was good cardioplegic arrest and septal temperature dropped less than 12 degrees.  Topical hypothermic saline flush was also used.  The patient was redosed with cardioplegia every 20 minutes while the crossclamp was in place.  The distal coronary anastomoses were then performed.  The first distal anastomosis was the RV branch of the right coronary.  This was a 1.5-mm vessel.  A reverse saphenous vein (cryopreserved allograft) was sewn end- to-side with running 7-0 Prolene with good flow through the graft. Cardioplegia was redosed.  The second distal anastomosis was the OM2 branch of the circumflex. This has a proximal 95% stenosis in the stents.  The left IMA pedicle was brought through an opening in the left lateral pericardium, was brought down onto the OM2 and sewn  end-to-side with running 8-0 Prolene. There was good flow through the anastomosis and the pedicle bulldog was reapplied.  The pedicle was secured to the epicardium with 6-0 Prolene sutures.  Cardioplegia was redosed.  Attention was then directed to the mitral valve.  The interatrial groove was dissected.  Caval tapes were tightened.  A left atriotomy incision was performed.  The atrial retractors were placed.  Exposure of the valve was adequate to good.  The valve was small.  The valve had some calcification in the posterior anulus and also some calcified leaflet in the A3 anterior leaflet segment.  The cords appeared to be without prolapse or rupture.  We elected to try annuloplasty repair first due to the small size of the valve which would make  replacement difficult.  A 2- 0 Ethibond sutures were placed around the anulus.  The valve was tested and still had a central jet of regurgitation.  A 24 mm physio ring was then seated through the sutures and the sutures were tied.  The valve was again tested.  There was improvement of the mitral regurgitation but not complete correction.  This was due to some tethering of the anterior leaflet where it was calcified as well as calcification of the posterior anulus.  It was felt that the valve repair would not provide adequate correction of her severe mitral regurgitation.  So, the sutures and the valve ring removed.  Cardioplegia was redosed.  The valve was then sized with the valve sizers.  The 25 mm Edwards valve sizer would not fit into the anulus initially.  This was the smallest tissue valve it was available to [name].  The anterior leaflet was then excised in the area of the calcification.  The sizer still would not fit into the mitral anulus.  The posterior calcified leaflet was then removed and debrided.  This allowed passage of the sizer into the anulus and orifice.  Pledgeted 2-0 Ethibond sutures were then placed around the anulus in the supraannular position.  The remaining cord to the anterior leaflet were incorporated into the valve sutures.  The suture was then placed through the sewing ring of the valve, which was repaired according to protocol. The valve was seated and the sutures were tied.  The valve was inspected and found to be adequately seated without evidence of paravalvular leak. The valve was tested with saline and was competent.  The atriotomy was then closed in 2 layers using running 3-0 Prolene. The proximal vein anastomosis was then placed in the ascending aorta using a 4.5-mm punch and running 6-0 Prolene.  Air was vented from the coronaries with a dose of retrograde warm blood cardioplegia and then the crossclamp was removed.  The patient was rewarmed and  reperfused.  The bulldog had been removed from the mammary artery.  Both pleural spaces were opened and pleural effusions were drained. Temporary pacing wires were applied.  Hemostasis was documented at the area of the proximal and distal coronary anastomoses and the atriotomy. The [place] tracing appeared to be abnormal and it was found that the [place] would not move freely.  A suture in the atriotomy closure was revised, which allowed the [place] PA catheter to be moved freely.  This produced a normal waveform.  The patient was placed on low-dose milrinone and dopamine.  The lungs were re-expanded.  Due to the preoperative severe pulmonary hypertension, inhaled nitric oxide was started to the ventilator circuit.  The patient was then weaned off cardiopulmonary bypass.  The platelet count was low and platelets and FFP ordered.  The patient's echo showed normal functioning mitral valve prosthesis without MR. There is baseline preserved biventricular function.  The aortic valve had no significant AI or AS.  Protamine was administered without adverse reaction.  The cannulas were removed.  After the protamine, the patient had a coagulopathy, which was severe.  Platelets were given as well as FFP.  Shortly after being administered platelets, the patient developed a transfusion reaction with rash and vasodilatation with low diastolic pressure.  The patient was started on high-dose Neo-Synephrine, which was transitioned to norepinephrine.  She was given steroids and Benadryl for the allergic reaction and then was noted to subsequently have a significant generalized rash.  Cardiac function remained very dynamic and there was no change in the valvular function.  With the medical treatment of the transfusion reaction, her blood pressure improved from 09-32 systolic back to 35-57.  A femoral A-line was placed to help with blood pressure measurement.  The patient had a unit of FFP administered.  Because  of significant coagulopathy and the fact that we could not use platelets, a small dose of recombinant factor 7 (two 1 mg doses) was administered which improved coagulation function.  The coronary anastomoses and cannulation sites and the aortotomy were inspected and found to be hemostatic.  Drainage tubes were placed in both pleural spaces and the anterior mediastinum brought out through a separate incision.  The sternum was closed with wire.  The patient's hemodynamics remained stable.  The pectoralis fascia was closed with a running #1 Vicryl.  The subcutaneous and skin layers were closed in running Vicryl and sterile dressings were applied. The patient then returned to the intensive care unit in a critical, but stable condition.  She was transported on the ventilator to help preserve nitric oxide inhalation as well as her borderline oxygenation. Total cardiopulmonary bypass time was 225 minutes.     Ivin Poot, M.D.     PV/MEDQ  D:  11/09/2013  T:  11/09/2013  Job:  [id number]  cc:   Laverda Page, MD

## 2013-11-11 ENCOUNTER — Inpatient Hospital Stay (HOSPITAL_COMMUNITY): Payer: Medicare HMO

## 2013-11-11 LAB — COMPREHENSIVE METABOLIC PANEL
ALT: 127 U/L — ABNORMAL HIGH (ref 0–35)
AST: 137 U/L — ABNORMAL HIGH (ref 0–37)
Albumin: 2.6 g/dL — ABNORMAL LOW (ref 3.5–5.2)
Alkaline Phosphatase: 46 U/L (ref 39–117)
Anion gap: 16 — ABNORMAL HIGH (ref 5–15)
BUN: 43 mg/dL — ABNORMAL HIGH (ref 6–23)
CO2: 20 mEq/L (ref 19–32)
Calcium: 8.1 mg/dL — ABNORMAL LOW (ref 8.4–10.5)
Chloride: 95 mEq/L — ABNORMAL LOW (ref 96–112)
Creatinine, Ser: 3.41 mg/dL — ABNORMAL HIGH (ref 0.50–1.10)
GFR calc Af Amer: 14 mL/min — ABNORMAL LOW (ref >90)
GFR calc non Af Amer: 12 mL/min — ABNORMAL LOW (ref >90)
Glucose, Bld: 165 mg/dL — ABNORMAL HIGH (ref 70–99)
Potassium: 4.8 mEq/L (ref 3.7–5.3)
Sodium: 131 mEq/L — ABNORMAL LOW (ref 137–147)
Total Bilirubin: 1 mg/dL (ref 0.3–1.2)
Total Protein: 4.8 g/dL — ABNORMAL LOW (ref 6.0–8.3)

## 2013-11-11 LAB — TYPE AND SCREEN
ABO/RH(D): O POS
Antibody Screen: NEGATIVE
Unit division: 0
Unit division: 0
Unit division: 0
Unit division: 0
Unit division: 0
Unit division: 0
Unit division: 0
Unit division: 0
Unit division: 0
Unit division: 0

## 2013-11-11 LAB — CBC
HCT: 22.8 % — ABNORMAL LOW (ref 36.0–46.0)
HCT: 25.7 % — ABNORMAL LOW (ref 36.0–46.0)
Hemoglobin: 8 g/dL — ABNORMAL LOW (ref 12.0–15.0)
Hemoglobin: 9.2 g/dL — ABNORMAL LOW (ref 12.0–15.0)
MCH: 29.2 pg (ref 26.0–34.0)
MCH: 30.1 pg (ref 26.0–34.0)
MCHC: 35.1 g/dL (ref 30.0–36.0)
MCHC: 35.8 g/dL (ref 30.0–36.0)
MCV: 83.2 fL (ref 78.0–100.0)
MCV: 84 fL (ref 78.0–100.0)
Platelets: 62 10*3/uL — ABNORMAL LOW (ref 150–400)
Platelets: 59 10*3/uL — ABNORMAL LOW (ref 150–400)
RBC: 2.74 MIL/uL — ABNORMAL LOW (ref 3.87–5.11)
RBC: 3.06 MIL/uL — ABNORMAL LOW (ref 3.87–5.11)
RDW: 15.8 % — ABNORMAL HIGH (ref 11.5–15.5)
RDW: 15.4 % (ref 11.5–15.5)
WBC: 15.4 10*3/uL — ABNORMAL HIGH (ref 4.0–10.5)
WBC: 13.8 10*3/uL — ABNORMAL HIGH (ref 4.0–10.5)

## 2013-11-11 LAB — GLUCOSE, CAPILLARY
GLUCOSE-CAPILLARY: 130 mg/dL — AB (ref 70–99)
Glucose-Capillary: 140 mg/dL — ABNORMAL HIGH (ref 70–99)
Glucose-Capillary: 134 mg/dL — ABNORMAL HIGH (ref 70–99)
Glucose-Capillary: 135 mg/dL — ABNORMAL HIGH (ref 70–99)
Glucose-Capillary: 80 mg/dL (ref 70–99)

## 2013-11-11 LAB — POCT I-STAT 3, ART BLOOD GAS (G3+)
ACID-BASE DEFICIT: 4 mmol/L — AB (ref 0.0–2.0)
Acid-base deficit: 7 mmol/L — ABNORMAL HIGH (ref 0.0–2.0)
BICARBONATE: 21.1 meq/L (ref 20.0–24.0)
Bicarbonate: 18.5 mEq/L — ABNORMAL LOW (ref 20.0–24.0)
O2 Saturation: 91 %
O2 Saturation: 93 %
PO2 ART: 62 mmHg — AB (ref 80.0–100.0)
TCO2: 22 mmol/L (ref 0–100)
TCO2: 20 mmol/L (ref 0–100)
pCO2 arterial: 35.6 mmHg (ref 35.0–45.0)
pCO2 arterial: 36.1 mmHg (ref 35.0–45.0)
pH, Arterial: 7.381 (ref 7.350–7.450)
pH, Arterial: 7.318 — ABNORMAL LOW (ref 7.350–7.450)
pO2, Arterial: 73 mmHg — ABNORMAL LOW (ref 80.0–100.0)

## 2013-11-11 LAB — CARBOXYHEMOGLOBIN
Carboxyhemoglobin: 1.2 % (ref 0.5–1.5)
Carboxyhemoglobin: 1.5 % (ref 0.5–1.5)
Methemoglobin: 0.6 % (ref 0.0–1.5)
Methemoglobin: 0.8 % (ref 0.0–1.5)
O2 Saturation: 49.7 %
O2 Saturation: 57.3 %
Total hemoglobin: 14 g/dL (ref 12.0–16.0)
Total hemoglobin: 7.8 g/dL — ABNORMAL LOW (ref 12.0–16.0)

## 2013-11-11 LAB — PREPARE RBC (CROSSMATCH)

## 2013-11-11 MED ORDER — SODIUM CHLORIDE 0.9 % IV SOLN: Freq: Once | INTRAVENOUS | Status: DC

## 2013-11-11 MED ORDER — FUROSEMIDE 10 MG/ML IJ SOLN
80.0000 mg | Freq: Once | INTRAMUSCULAR | Status: AC
  Administered 2013-11-11: 80 mg via INTRAVENOUS

## 2013-11-11 MED ORDER — AMIODARONE HCL IN DEXTROSE 360-4.14 MG/200ML-% IV SOLN
30.0000 mg/h | INTRAVENOUS | Status: DC
  Administered 2013-11-11: 30 mg/h via INTRAVENOUS
  Filled 2013-11-11 (×4): qty 200

## 2013-11-11 MED ORDER — AMIODARONE HCL IN DEXTROSE 360-4.14 MG/200ML-% IV SOLN
60.0000 mg/h | INTRAVENOUS | Status: AC
  Administered 2013-11-11 – 2013-11-12 (×2): 60 mg/h via INTRAVENOUS
  Filled 2013-11-11: qty 200

## 2013-11-11 MED ORDER — FUROSEMIDE 10 MG/ML IJ SOLN
8.0000 mg/h | INTRAVENOUS | Status: DC
  Administered 2013-11-11: 8 mg/h via INTRAVENOUS
  Filled 2013-11-11: qty 25

## 2013-11-11 MED ORDER — SODIUM BICARBONATE 8.4 % IV SOLN
50.0000 meq | Freq: Once | INTRAVENOUS | Status: AC
  Administered 2013-11-11: 50 meq via INTRAVENOUS
  Filled 2013-11-11: qty 50

## 2013-11-11 MED ORDER — AMIODARONE LOAD VIA INFUSION
150.0000 mg | Freq: Once | INTRAVENOUS | Status: AC
  Administered 2013-11-11: 150 mg via INTRAVENOUS
  Filled 2013-11-11: qty 83.34

## 2013-11-11 MED ORDER — AMIODARONE HCL IN DEXTROSE 360-4.14 MG/200ML-% IV SOLN
30.0000 mg/h | INTRAVENOUS | Status: DC
Start: 2013-11-12 — End: 2013-11-13
  Administered 2013-11-12 (×3): 30 mg/h via INTRAVENOUS
  Filled 2013-11-11 (×6): qty 200

## 2013-11-11 NOTE — Progress Notes (Signed)
3 Days Post-Op Procedure(s) (LRB): CORONARY ARTERY BYPASS GRAFTING (CABG), on pump, times two, using left internal mammary artery, cryo saphenous vein. (N/A) INTRAOPERATIVE TRANSESOPHAGEAL ECHOCARDIOGRAM (N/A) MITRAL VALVE (MV) REPLACEMENT (N/A) Subjective: Postop day 3 urgent CABG x2 with mitral valve replacement-tissue valve for severe MR with severe pulmonary hypertension Patient back in sinus rhythm after a brief episode atrial fibrillation Patient extubated successfully yesterday and neuro intact, angled onset of bed Pleural drain is removed, PA catheter removed today Acute on chronic renal insufficiency-ATN, urine output low with rising creatinine Cardiac output and blood pressure now stable on low-dose pressors Blood sugars controlled with sliding scale and Lantus insulin Echocardiogram performed yesterday demonstrates adequate biventricular function, no pericardial effusion, mitral valve function normal, no AI or AS Objective: Vital signs in last 24 hours: Temp:  [97.3 F (36.3 C)-98.4 F (36.9 C)] 98.4 F (36.9 C) (09/07 1100) Pulse Rate:  [71-154] 74 (09/07 1100) Cardiac Rhythm:  [-] Normal sinus rhythm (09/07 1100) Resp:  [11-23] 15 (09/07 1100) BP: (89-130)/(34-63) 127/35 mmHg (09/07 1100) SpO2:  [96 %-100 %] 99 % (09/07 1100) Weight:  [223 lb 12.3 oz (101.5 kg)] 223 lb 12.3 oz (101.5 kg) (09/07 0530)  Hemodynamic parameters for last 24 hours: PAP: (26-54)/(11-22) 52/15 mmHg CVP:  [0 mmHg-19 mmHg] 16 mmHg CO:  [3.8 L/min-5.3 L/min] 5.3 L/min CI:  [1.9 L/min/m2-2.7 L/min/m2] 2.7 L/min/m2  Intake/Output from previous day: 09/06 0701 - 09/07 0700 In: 2347.4 [I.V.:2047.4; IV Piggyback:300] Out: 1025 [Urine:425; Chest Tube:600] Intake/Output this shift: Total I/O In: 206 [I.V.:206] Out: 220 [Urine:60; Chest Tube:160]  Patient comfortable Significant edema secondary to acute on chronic renal failure Systolic murmur Lungs clear Weak but neuro intact  Lab  Results:  Recent Labs  11/10/13 1550 11/11/13 0420  WBC 15.1* 15.4*  HGB 8.7* 8.0*  HCT 24.6* 22.8*  PLT 60* 62*   BMET:  Recent Labs  11/10/13 1550 11/11/13 0420  NA 133* 131*  K 5.2 4.8  CL 97 95*  CO2 18* 20  GLUCOSE 177* 165*  BUN 38* 43*  CREATININE 3.06* 3.41*  CALCIUM 8.3* 8.1*    PT/INR:  Recent Labs  11/08/13 1700  LABPROT 22.9*  INR 2.02*   ABG    Component Value Date/Time   PHART 7.381 11/11/2013 0821   HCO3 21.1 11/11/2013 0821   TCO2 22 11/11/2013 0821   ACIDBASEDEF 4.0* 11/11/2013 0821   O2SAT 91.0 11/11/2013 0821   CBG (last 3)   Recent Labs  11/10/13 1946 11/10/13 2309 11/11/13 0825  GLUCAP 148* 160* 130*    Assessment/Plan: S/P Procedure(s) (LRB): CORONARY ARTERY BYPASS GRAFTING (CABG), on pump, times two, using left internal mammary artery, cryo saphenous vein. (N/A) INTRAOPERATIVE TRANSESOPHAGEAL ECHOCARDIOGRAM (N/A) MITRAL VALVE (MV) REPLACEMENT (N/A)  Postoperative acute on chronic renal failure, continue renal dose dopamine and maintain adequate blood pressure Thrombocytopenia with perioperative coagulopathy and transfusion reaction to platelets-HIT screen pending We'll start oral diet, nausea improved Out of bed to chair, physical therapy ordered Resume Plavix after platelet count normalizes forCryoVein graft to RCA  LOS: 6 days    VAN TRIGT III,Stevana Dufner 11/11/2013

## 2013-11-11 NOTE — Progress Notes (Signed)
CT surgery p.m. Rounds  Patient blood pressure gradually rising with increased SVR Hemoglobin 9.4 g after transfusion one unit Dangle onset of bed, chest tubes out, PA catheter out Wean drips and maintaining adequate blood pressure Maintaining sinus rhythm with IV amiodarone Platelet counts below 65,000 Continue current care

## 2013-11-12 ENCOUNTER — Encounter (HOSPITAL_COMMUNITY): Payer: Self-pay | Admitting: Cardiothoracic Surgery

## 2013-11-12 ENCOUNTER — Inpatient Hospital Stay (HOSPITAL_COMMUNITY): Payer: Medicare HMO

## 2013-11-12 LAB — COMPREHENSIVE METABOLIC PANEL
ALT: 109 U/L — ABNORMAL HIGH (ref 0–35)
AST: 47 U/L — ABNORMAL HIGH (ref 0–37)
Albumin: 2.6 g/dL — ABNORMAL LOW (ref 3.5–5.2)
Alkaline Phosphatase: 59 U/L (ref 39–117)
Anion gap: 15 (ref 5–15)
BUN: 51 mg/dL — ABNORMAL HIGH (ref 6–23)
CO2: 22 mEq/L (ref 19–32)
Calcium: 8.1 mg/dL — ABNORMAL LOW (ref 8.4–10.5)
Chloride: 93 mEq/L — ABNORMAL LOW (ref 96–112)
Creatinine, Ser: 3.86 mg/dL — ABNORMAL HIGH (ref 0.50–1.10)
GFR calc Af Amer: 12 mL/min — ABNORMAL LOW (ref >90)
GFR calc non Af Amer: 10 mL/min — ABNORMAL LOW (ref >90)
Glucose, Bld: 42 mg/dL — CL (ref 70–99)
Potassium: 4.1 mEq/L (ref 3.7–5.3)
Sodium: 130 mEq/L — ABNORMAL LOW (ref 137–147)
Total Bilirubin: 0.9 mg/dL (ref 0.3–1.2)
Total Protein: 4.9 g/dL — ABNORMAL LOW (ref 6.0–8.3)

## 2013-11-12 LAB — POCT I-STAT 3, ART BLOOD GAS (G3+)
ACID-BASE DEFICIT: 5 mmol/L — AB (ref 0.0–2.0)
BICARBONATE: 22.3 meq/L (ref 20.0–24.0)
O2 Saturation: 75 %
PCO2 ART: 54.9 mmHg — AB (ref 35.0–45.0)
PH ART: 7.216 — AB (ref 7.350–7.450)
PO2 ART: 49 mmHg — AB (ref 80.0–100.0)
TCO2: 24 mmol/L (ref 0–100)

## 2013-11-12 LAB — TYPE AND SCREEN
ABO/RH(D): O POS
Antibody Screen: NEGATIVE
Unit division: 0

## 2013-11-12 LAB — URINE MICROSCOPIC-ADD ON

## 2013-11-12 LAB — BASIC METABOLIC PANEL
Anion gap: 14 (ref 5–15)
BUN: 53 mg/dL — ABNORMAL HIGH (ref 6–23)
CO2: 23 mEq/L (ref 19–32)
Calcium: 7.8 mg/dL — ABNORMAL LOW (ref 8.4–10.5)
Chloride: 90 mEq/L — ABNORMAL LOW (ref 96–112)
Creatinine, Ser: 3.73 mg/dL — ABNORMAL HIGH (ref 0.50–1.10)
GFR calc Af Amer: 12 mL/min — ABNORMAL LOW (ref >90)
GFR calc non Af Amer: 11 mL/min — ABNORMAL LOW (ref >90)
Glucose, Bld: 107 mg/dL — ABNORMAL HIGH (ref 70–99)
Potassium: 4.5 mEq/L (ref 3.7–5.3)
Sodium: 127 mEq/L — ABNORMAL LOW (ref 137–147)

## 2013-11-12 LAB — URINALYSIS, ROUTINE W REFLEX MICROSCOPIC
Bilirubin Urine: NEGATIVE
GLUCOSE, UA: NEGATIVE mg/dL
Hgb urine dipstick: NEGATIVE
Ketones, ur: 15 mg/dL — AB
Nitrite: NEGATIVE
PROTEIN: NEGATIVE mg/dL
SPECIFIC GRAVITY, URINE: 1.023 (ref 1.005–1.030)
Urobilinogen, UA: 0.2 mg/dL (ref 0.0–1.0)
pH: 5 (ref 5.0–8.0)

## 2013-11-12 LAB — CBC
HCT: 26.4 % — ABNORMAL LOW (ref 36.0–46.0)
Hemoglobin: 9.3 g/dL — ABNORMAL LOW (ref 12.0–15.0)
MCH: 29.7 pg (ref 26.0–34.0)
MCHC: 35.2 g/dL (ref 30.0–36.0)
MCV: 84.3 fL (ref 78.0–100.0)
Platelets: 68 10*3/uL — ABNORMAL LOW (ref 150–400)
RBC: 3.13 MIL/uL — ABNORMAL LOW (ref 3.87–5.11)
RDW: 15.5 % (ref 11.5–15.5)
WBC: 12.4 10*3/uL — ABNORMAL HIGH (ref 4.0–10.5)

## 2013-11-12 LAB — POCT I-STAT, CHEM 8
BUN: 25 mg/dL — ABNORMAL HIGH (ref 6–23)
CHLORIDE: 98 meq/L (ref 96–112)
Calcium, Ion: 1.28 mmol/L (ref 1.13–1.30)
Creatinine, Ser: 1.4 mg/dL — ABNORMAL HIGH (ref 0.50–1.10)
Glucose, Bld: 172 mg/dL — ABNORMAL HIGH (ref 70–99)
HEMATOCRIT: 23 % — AB (ref 36.0–46.0)
Hemoglobin: 7.8 g/dL — ABNORMAL LOW (ref 12.0–15.0)
POTASSIUM: 4.5 meq/L (ref 3.7–5.3)
Sodium: 132 mEq/L — ABNORMAL LOW (ref 137–147)
TCO2: 23 mmol/L (ref 0–100)

## 2013-11-12 LAB — CARBOXYHEMOGLOBIN
Carboxyhemoglobin: 1.4 % (ref 0.5–1.5)
Methemoglobin: 0.8 % (ref 0.0–1.5)
O2 Saturation: 62.2 %
Total hemoglobin: 9.1 g/dL — ABNORMAL LOW (ref 12.0–16.0)

## 2013-11-12 LAB — GLUCOSE, CAPILLARY
GLUCOSE-CAPILLARY: 70 mg/dL (ref 70–99)
Glucose-Capillary: 82 mg/dL (ref 70–99)
Glucose-Capillary: 91 mg/dL (ref 70–99)
Glucose-Capillary: 99 mg/dL (ref 70–99)
Glucose-Capillary: 99 mg/dL (ref 70–99)

## 2013-11-12 MED ORDER — FUROSEMIDE 10 MG/ML IJ SOLN
80.0000 mg | Freq: Four times a day (QID) | INTRAMUSCULAR | Status: DC
  Administered 2013-11-12 – 2013-11-14 (×7): 80 mg via INTRAVENOUS
  Filled 2013-11-12 (×11): qty 8

## 2013-11-12 MED ORDER — DEXTROSE 50 % IV SOLN
INTRAVENOUS | Status: AC
  Filled 2013-11-12: qty 50

## 2013-11-12 MED ORDER — DEXTROSE 50 % IV SOLN
25.0000 mL | Freq: Once | INTRAVENOUS | Status: AC | PRN
  Administered 2013-11-12: 25 mL via INTRAVENOUS

## 2013-11-12 MED ORDER — FUROSEMIDE 10 MG/ML IJ SOLN: 40.0000 mg | Freq: Once | INTRAMUSCULAR | Status: DC

## 2013-11-12 MED ORDER — INSULIN DETEMIR 100 UNIT/ML ~~LOC~~ SOLN
10.0000 [IU] | Freq: Two times a day (BID) | SUBCUTANEOUS | Status: DC
  Administered 2013-11-12 (×2): 10 [IU] via SUBCUTANEOUS
  Filled 2013-11-12 (×4): qty 0.1

## 2013-11-12 MED FILL — Electrolyte-R (PH 7.4) Solution: INTRAVENOUS | Qty: 4000 | Status: AC

## 2013-11-12 MED FILL — Heparin Sodium (Porcine) Inj 1000 Unit/ML: INTRAMUSCULAR | Qty: 20 | Status: AC

## 2013-11-12 MED FILL — Mannitol IV Soln 20%: INTRAVENOUS | Qty: 500 | Status: AC

## 2013-11-12 MED FILL — Potassium Chloride Inj 2 mEq/ML: INTRAVENOUS | Qty: 40 | Status: AC

## 2013-11-12 MED FILL — Sodium Bicarbonate IV Soln 8.4%: INTRAVENOUS | Qty: 150 | Status: AC

## 2013-11-12 MED FILL — Calcium Chloride Inj 10%: INTRAVENOUS | Qty: 10 | Status: AC

## 2013-11-12 MED FILL — Sodium Chloride IV Soln 0.9%: INTRAVENOUS | Qty: 4000 | Status: AC

## 2013-11-12 MED FILL — Heparin Sodium (Porcine) Inj 1000 Unit/ML: INTRAMUSCULAR | Qty: 30 | Status: AC

## 2013-11-12 MED FILL — Magnesium Sulfate Inj 50%: INTRAMUSCULAR | Qty: 10 | Status: AC

## 2013-11-12 MED FILL — Lidocaine HCl IV Inj 20 MG/ML: INTRAVENOUS | Qty: 10 | Status: AC

## 2013-11-12 NOTE — Anesthesia Postprocedure Evaluation (Signed)
  Anesthesia Post-op Note  Patient: Claudia [name]  Procedure(s) Performed: Procedure(s) with comments: CORONARY ARTERY BYPASS GRAFTING (CABG), on pump, times two, using left internal mammary artery, cryo saphenous vein. (N/A) - LIMA-LAD CRYOVEIN -OM INTRAOPERATIVE TRANSESOPHAGEAL ECHOCARDIOGRAM (N/A) MITRAL VALVE (MV) REPLACEMENT (N/A) - #25 MAGNA MITRAL EASE  Patient Location: SICU  Anesthesia Type:General  Level of Consciousness: Patient remains intubated per anesthesia plan  Airway and Oxygen Therapy: Patient remains intubated per anesthesia plan and Patient placed on Ventilator (see vital sign flow sheet for setting)  Post-op Pain: none  Post-op Assessment: Post-op Vital signs reviewed, Patient's Cardiovascular Status Stable, Respiratory Function Stable, Patent Airway and No signs of Nausea or vomiting  Post-op Vital Signs: stable  Last Vitals:  Filed Vitals:   11/12/13 0630  BP: 129/68  Pulse: 106  Temp:   Resp: 30    Complications: No apparent anesthesia complications

## 2013-11-12 NOTE — Progress Notes (Signed)
Patient ID: Claudia [name], female   DOB: 16-Apr-1931, 80 y.o.   MRN: [id number] EVENING ROUNDS NOTE :     [address]       [place] [address]             [phone]                 4 Days Post-Op Procedure(s) (LRB): CORONARY ARTERY BYPASS GRAFTING (CABG), on pump, times two, using left internal mammary artery, cryo saphenous vein. (N/A) INTRAOPERATIVE TRANSESOPHAGEAL ECHOCARDIOGRAM (N/A) MITRAL VALVE (MV) REPLACEMENT (N/A)  Total Length of Stay:  LOS: 7 days  BP 129/41  Pulse 78  Temp(Src) 98.2 F (36.8 C) (Oral)  Resp 19  Ht 5\' 7"  (1.702 m)  Wt 232 lb 2.3 oz (105.3 kg)  BMI 36.35 kg/m2  SpO2 98%  .Intake/Output     09/07 0701 - 09/08 0700 09/08 0701 - 09/09 0700   P.O. 400    I.V. (mL/kg) 1515.7 (14.4) 573.6 (5.4)   Blood 337.5    IV Piggyback 50    Total Intake(mL/kg) 2303.2 (21.9) 573.6 (5.4)   Urine (mL/kg/hr) 360 (0.1) 325 (0.3)   Chest Tube 210 (0.1)    Total Output 570 325   Net +1733.2 +248.6          . sodium chloride 20 mL/hr at 11/12/13 1700  . sodium chloride    . amiodarone 30 mg/hr (11/12/13 1700)  . DOPamine 3 mcg/kg/min (11/12/13 1700)  . epinephrine Stopped (11/12/13 1000)  . milrinone 0.3 mcg/kg/min (11/12/13 1700)  . norepinephrine (LEVOPHED) Adult infusion Stopped (11/12/13 1340)     Lab Results  Component Value Date   WBC 12.4* 11/12/2013   HGB 9.3* 11/12/2013   HCT 26.4* 11/12/2013   PLT 68* 11/12/2013   GLUCOSE 107* 11/12/2013   CHOL 125 12/03/2012   TRIG 42 12/03/2012   HDL 72 12/03/2012   LDLCALC 45 12/03/2012   ALT 109* 11/12/2013   AST 47* 11/12/2013   NA 127* 11/12/2013   K 4.5 11/12/2013   CL 90* 11/12/2013   CREATININE 3.73* 11/12/2013   BUN 53* 11/12/2013   CO2 23 11/12/2013   TSH 0.596 12/02/2012   INR 2.02* 11/08/2013   HGBA1C 7.2* 11/06/2013   Off levophed, epi now off Still milrinone Now in sinus on amiodarone    Grace Isaac MD  Beeper [phone] Office [phone] 11/12/2013 5:41 PM

## 2013-11-12 NOTE — Evaluation (Signed)
Physical Therapy Evaluation Patient Details Name: Claudia [name] MRN: [id number] DOB: 12/10/1931 Today's Date: 11/12/2013   History of Present Illness  Pt adm with chest pain and underwent urgent CABG x 2 and MVR on 11/09/13. PMH - HTN, MI, DM  Clinical Impression  Pt admitted with above. Pt currently with functional limitations due to the deficits listed below (see PT Problem List).  Pt will benefit from skilled PT to increase their independence and safety with mobility to allow discharge to the venue listed below. If pt progresses rapidly may not need SNF and may be able to return home with HHPT.      Follow Up Recommendations SNF    Equipment Recommendations  None recommended by PT    Recommendations for Other Services       Precautions / Restrictions Precautions Precautions: Fall Restrictions Weight Bearing Restrictions:  (Sternal precautions)      Mobility  Bed Mobility Overal bed mobility: Needs Assistance Bed Mobility: Supine to Sit;Sit to Supine     Supine to sit: +2 for physical assistance;Mod assist Sit to supine: +2 for physical assistance;Max assist   General bed mobility comments: Assist to bring legs over and trunk up into sitting. Assist to lower trunk and bring legs back up into bed to return to supine.  Transfers Overall transfer level: Needs assistance Equipment used: Ambulation equipment used Charlaine Dalton) Transfers: Sit to/from Stand Sit to Stand: +2 physical assistance;Mod assist         General transfer comment: Verbal cues for sternal precautions and assist to bring hips up. Stood x 2 in stedy for 1--15 secs.  Ambulation/Gait                Stairs            Wheelchair Mobility    Modified Rankin (Stroke Patients Only)       Balance Overall balance assessment: Needs assistance Sitting-balance support: Bilateral upper extremity supported;Feet unsupported Sitting balance-Leahy Scale: Poor Sitting balance - Comments: Assist to  correct posterior lean. Postural control: Posterior lean Standing balance support: Bilateral upper extremity supported Standing balance-Leahy Scale: Poor Standing balance comment: Support of Stedy and 2 person min A to maintain standing.                             Pertinent Vitals/Pain Pain Assessment: No/denies pain    Home Living Family/patient expects to be discharged to:: Private residence Living Arrangements: Children Available Help at Discharge: Family;Available 24 hours/day Type of Home: House Home Access: Stairs to enter Entrance Stairs-Rails: Right Entrance Stairs-Number of Steps: 8 Home Layout: One level Home Equipment: Walker - 2 wheels;Cane - single point      Prior Function Level of Independence: Independent               Hand Dominance        Extremity/Trunk Assessment   Upper Extremity Assessment: Generalized weakness           Lower Extremity Assessment: Generalized weakness         Communication   Communication: No difficulties  Cognition Arousal/Alertness: Awake/alert Behavior During Therapy: WFL for tasks assessed/performed Overall Cognitive Status: Within Functional Limits for tasks assessed                      General Comments      Exercises        Assessment/Plan    PT Assessment Patient  needs continued PT services  PT Diagnosis Difficulty walking;Generalized weakness   PT Problem List Decreased strength;Decreased activity tolerance;Decreased balance;Decreased mobility;Decreased knowledge of use of DME;Decreased knowledge of precautions  PT Treatment Interventions DME instruction;Gait training;Functional mobility training;Therapeutic activities;Therapeutic exercise;Balance training;Patient/family education   PT Goals (Current goals can be found in the Care Plan section) Acute Rehab PT Goals Patient Stated Goal: Return home PT Goal Formulation: With patient Time For Goal Achievement:  11/26/13 Potential to Achieve Goals: Good    Frequency Min 3X/week   Barriers to discharge        Co-evaluation               End of Session Equipment Utilized During Treatment: Oxygen;Other (comment) Charlaine Dalton) Activity Tolerance: Patient limited by fatigue Patient left: in bed;with call bell/phone within reach Nurse Communication: Mobility status;Need for lift equipment         Time: [phone] PT Time Calculation (min): 25 min   Charges:   PT Evaluation $Initial PT Evaluation Tier I: 1 Procedure PT Treatments $Gait Training: 8-22 mins   PT G Codes:          Mahin Guardia 2013-11-15, 9:29 AM  [facility] PT [phone]

## 2013-11-12 NOTE — Progress Notes (Signed)
Hypoglycemic Event  CBG: 46  Treatment: D50  Symptoms: diaphoretic   Follow-up CBG: Time: 0505 CBG Result: 115  Possible Reasons for Event: Levemir  Comments/MD notified:    Claudia [name]  Remember to initiate Hypoglycemia Order Set & complete

## 2013-11-12 NOTE — Progress Notes (Addendum)
      [address]       [name] [address]             [phone]      4 Days Post-Op Procedure(s) (LRB): CORONARY ARTERY BYPASS GRAFTING (CABG), on pump, times two, using left internal mammary artery, cryo saphenous vein. (N/A) INTRAOPERATIVE TRANSESOPHAGEAL ECHOCARDIOGRAM (N/A) MITRAL VALVE (MV) REPLACEMENT (N/A)  Subjective:  No acute complaints.  Went back into Rapid Atrial Fibrillation overnight.  Also developed hypoglycemia this morning.   Objective: Vital signs in last 24 hours: Temp:  [97.8 F (36.6 C)-99.5 F (37.5 C)] 98.7 F (37.1 C) (09/08 0000) Pulse Rate:  [25-130] 105 (09/08 0800) Cardiac Rhythm:  [-] Atrial fibrillation (09/08 0800) Resp:  [12-36] 17 (09/08 0800) BP: (105-157)/(35-78) 135/73 mmHg (09/08 0800) SpO2:  [86 %-100 %] 96 % (09/08 0800) Arterial Line BP: (89-156)/(33-97) 129/43 mmHg (09/08 0800) Weight:  [232 lb 2.3 oz (105.3 kg)] 232 lb 2.3 oz (105.3 kg) (09/08 0500)  Hemodynamic parameters for last 24 hours: CVP:  [4 mmHg-22 mmHg] 4 mmHg  Intake/Output from previous day: 09/07 0701 - 09/08 0700 In: 2303.2 [P.O.:400; I.V.:1515.7; Blood:337.5; IV Piggyback:50] Out: 092 [Urine:360; Chest Tube:210] Intake/Output this shift: Total I/O In: 58.7 [I.V.:58.7] Out: 20 [Urine:20]  General appearance: alert, cooperative and no distress Heart: irregularly irregular rhythm Lungs: clear to auscultation bilaterally Abdomen: soft, non-tender; bowel sounds normal; no masses,  no organomegaly Extremities: edema 1+ Wound: clean and dry  Lab Results:  Recent Labs  11/11/13 1640 11/12/13 0400  WBC 13.8* 12.4*  HGB 9.2* 9.3*  HCT 25.7* 26.4*  PLT 59* 68*   BMET:  Recent Labs  11/11/13 0420 11/12/13 0400  NA 131* 130*  K 4.8 4.1  CL 95* 93*  CO2 20 22  GLUCOSE 165* 42*  BUN 43* 51*  CREATININE 3.41* 3.86*  CALCIUM 8.1* 8.1*    PT/INR: No results found for this basename: LABPROT, INR,  in the last 72 hours ABG      Component Value Date/Time   PHART 7.318* 11/11/2013 1642   HCO3 18.5* 11/11/2013 1642   TCO2 20 11/11/2013 1642   ACIDBASEDEF 7.0* 11/11/2013 1642   O2SAT 62.2 11/12/2013 0515   CBG (last 3)   Recent Labs  11/11/13 1638 11/11/13 2007 11/12/13 0756  GLUCAP 135* 80 70    Assessment/Plan: S/P Procedure(s) (LRB): CORONARY ARTERY BYPASS GRAFTING (CABG), on pump, times two, using left internal mammary artery, cryo saphenous vein. (N/A) INTRAOPERATIVE TRANSESOPHAGEAL ECHOCARDIOGRAM (N/A) MITRAL VALVE (MV) REPLACEMENT (N/A)  1. CV- Atrial Fibrillation- received bolus this morning, currently on Amiodarone, Dopamine, Epinephrine and Milrinone will wean as tolerated 2. Pulm- extubated yesterday, wean oxygen as tolerated, encouraged pulmonary toilet, CXR free of pneumothorax, bilateral effusions/atelectasis 3. Renal- creatinine trending up, U/O marginal on Lasix gtt 4. DM- hypoglycemia- will decrease Levemir dose to 10U BID, continue SSIP 5. Thrombocytopenia- improved to 68 will follow 6. Dispo- continue current care   LOS: 7 days    [name], Claudia 11/12/2013  Patient 30 lbs fluid overloaded- will ask for renal consult for short term HD patient examined and medical record reviewed,agree with above note. VAN TRIGT III,Zollie Clemence 11/12/2013

## 2013-11-12 NOTE — Consult Note (Signed)
[facility] Renal Consultation Note  Requesting MD: Lucianne Lei Tright Indication for Consultation:  AKI  HPI:  Claudia [name] is a 80 y.o. female with past medical history significant for diabetes mellitus, hypertension, hypercholesterolemia as well as CKD with a baseline creatinine in the mid ones.  She also has an extensive history of coronary artery disease requiring multiple coronary interventions. Due to ongoing symptoms she represented and underwent a CABG and a mitral valve replacement on 11/08/2013.  Creatinine was 1.54 preoperatively which indicates a GFR of at best 40 ml/min., then creatinine started rising postop and is now at a level of 3.86.  Patient initially had some lower blood pressures but has been hemodynamically stable of late.  She still remains on drips of dopamine, milrinone, amiodarone, norepinephrine and and was on epinephrine which just stopped this morning.  Her urine output is 20-35 cc per hour at present. Her cumulative in this are quite impressive at over 12 L and she is edematous. She has had some atrial fibrillation along the way , but mostly really an uneventful recovery. Otherwise, she states that she feels well. Her mental status is great and she is asking appropriate questions.   Creat  Date/Time Value Ref Range Status  06/25/2012 12:56 PM 1.38* 0.50 - 1.10 mg/dL Final     Creatinine, Ser  Date/Time Value Ref Range Status  11/12/2013  4:00 AM 3.86* 0.50 - 1.10 mg/dL Final  11/11/2013  4:20 AM 3.41* 0.50 - 1.10 mg/dL Final  11/10/2013  3:50 PM 3.06* 0.50 - 1.10 mg/dL Final  11/10/2013  4:00 AM 2.59* 0.50 - 1.10 mg/dL Final  11/09/2013  8:45 PM 2.30* 0.50 - 1.10 mg/dL Final  11/09/2013  3:21 PM 2.14* 0.50 - 1.10 mg/dL Final  11/09/2013  3:45 AM 1.73* 0.50 - 1.10 mg/dL Final  11/08/2013  9:56 PM 1.54* 0.50 - 1.10 mg/dL Final  11/08/2013  4:14 PM 1.40* 0.50 - 1.10 mg/dL Final  11/08/2013  3:24 PM 1.50* 0.50 - 1.10 mg/dL Final  11/08/2013  2:46 PM 1.40* 0.50 - 1.10 mg/dL  Final  11/08/2013  1:08 PM 1.40* 0.50 - 1.10 mg/dL Final  11/08/2013 12:10 PM 1.50* 0.50 - 1.10 mg/dL Final  11/08/2013 11:05 AM 1.60* 0.50 - 1.10 mg/dL Final  11/08/2013 10:09 AM 1.20* 0.50 - 1.10 mg/dL Final  11/08/2013  9:48 AM 1.50* 0.50 - 1.10 mg/dL Final  11/08/2013  7:52 AM 1.60* 0.50 - 1.10 mg/dL Final  11/08/2013  3:44 AM 1.65* 0.50 - 1.10 mg/dL Final  11/05/2013 10:40 AM 1.57* 0.50 - 1.10 mg/dL Final  06/28/2013  9:45 AM 1.50* 0.50 - 1.10 mg/dL Final  06/27/2013  9:40 AM 1.82* 0.50 - 1.10 mg/dL Final  06/26/2013  5:39 AM 1.57* 0.50 - 1.10 mg/dL Final  06/26/2013 12:29 AM 1.79* 0.50 - 1.10 mg/dL Final  01/23/2013  9:56 AM 1.24* 0.50 - 1.10 mg/dL Final  01/22/2013 10:25 AM 1.36* 0.50 - 1.10 mg/dL Final  12/05/2012  4:40 AM 1.10  0.50 - 1.10 mg/dL Final  12/04/2012  2:00 AM 1.17* 0.50 - 1.10 mg/dL Final  12/02/2012  3:55 AM 1.42* 0.50 - 1.10 mg/dL Final  09/04/2012  3:48 AM 1.33* 0.50 - 1.10 mg/dL Final  09/03/2012  8:44 AM 1.26* 0.50 - 1.10 mg/dL Final  09/02/2012 11:26 PM 1.30* 0.50 - 1.10 mg/dL Final  06/01/2012  4:58 AM 1.31* 0.50 - 1.10 mg/dL Final  02/21/2012  4:30 AM 1.27* 0.50 - 1.10 mg/dL Final  02/18/2012  6:24 AM 1.17* 0.50 -  1.10 mg/dL Final  02/17/2012 11:24 PM 1.32* 0.50 - 1.10 mg/dL Final  02/17/2012  6:55 PM 1.46* 0.50 - 1.10 mg/dL Final  12/22/2011  6:00 AM 1.26* 0.50 - 1.10 mg/dL Final  12/21/2011 11:45 AM 1.35* 0.50 - 1.10 mg/dL Final  10/28/2011  6:25 AM 1.09  0.50 - 1.10 mg/dL Final  10/27/2011  3:00 AM 1.53* 0.50 - 1.10 mg/dL Final  10/26/2011 10:28 PM 1.79* 0.50 - 1.10 mg/dL Final  10/26/2011  6:13 PM 1.97* 0.50 - 1.10 mg/dL Final  07/30/2011  6:25 AM 1.13* 0.50 - 1.10 mg/dL Final  07/29/2011  5:24 AM 1.03  0.50 - 1.10 mg/dL Final  07/28/2011  7:39 AM 0.80  0.50 - 1.10 mg/dL Final  07/20/2011  3:58 AM 1.15* 0.50 - 1.10 mg/dL Final  11/30/2009 10:22 PM 1.75* 0.40-1.20 mg/dL Final  11/04/2009  5:04 AM 0.91  0.4 - 1.2 mg/dL Final  11/03/2009  4:52 AM 0.97  0.4 - 1.2 mg/dL Final  11/02/2009   4:43 AM 0.97  0.4 - 1.2 mg/dL Final  11/01/2009  6:00 AM 1.11  0.4 - 1.2 mg/dL Final  10/31/2009 10:36 PM 1.26* 0.4 - 1.2 mg/dL Final     PMHx:   Past Medical History  Diagnosis Date  . GERD (gastroesophageal reflux disease)   . PUD (peptic ulcer disease)   . Anemia   . Hypertension   . S/P endoscopy Aug 2011    3 superficial gastric ulcers, NSAID-induced  . S/P colonoscopy Sept 2011    left-sided diverticula, tubular adenoma  . Coronary artery disease   . Shortness of breath   . Diabetes mellitus     insulin dependent  . Headache(784.0)     rare migraines  . Cancer     hx of skin cancer  . Arthritis   . Osteopenia   . Hypercholesterolemia   . SIADH (syndrome of inappropriate ADH production)   . CHF (congestive heart failure)   . Myocardial infarction 2013    Past Surgical History  Procedure Laterality Date  . Tonsillectomy    . Appendectomy    . Eye surgery      cataracts  . Cardiac stents  07/19/2011  . Esophagogastroduodenoscopy  10/16/09    normal without barrett's/three superficial gastric ulcers  . Colonoscopy  12/04/09    normal rectum/left-sided diverticula  . Joint replacement  arthroscopy to knee  . Toe amputation Left 2013    left second toe amputation  . Cardiac catheterization  01/22/2013  . Coronary angioplasty  07/2011  . Coronary artery bypass graft N/A 11/08/2013    Procedure: CORONARY ARTERY BYPASS GRAFTING (CABG), on pump, times two, using left internal mammary artery, cryo saphenous vein.;  Surgeon: Ivin Poot, MD;  Location: [place];  Service: Open Heart Surgery;  Laterality: N/A;  LIMA-LAD CRYOVEIN -OM  . Intraoperative transesophageal echocardiogram N/A 11/08/2013    Procedure: INTRAOPERATIVE TRANSESOPHAGEAL ECHOCARDIOGRAM;  Surgeon: Ivin Poot, MD;  Location: [place];  Service: Open Heart Surgery;  Laterality: N/A;  . Mitral valve replacement N/A 11/08/2013    Procedure: MITRAL VALVE (MV) REPLACEMENT;  Surgeon: Ivin Poot, MD;  Location:  [place];  Service: Open Heart Surgery;  Laterality: N/A;  #25 MAGNA MITRAL EASE    Family Hx: No family history on file.  Social History:  reports that she has never smoked. She has never used smokeless tobacco. She reports that she does not drink alcohol or use illicit drugs.  Allergies:  Allergies  Allergen Reactions  .  Aspirin Other (See Comments)    High doses caused stomach bleeds    Medications: Prior to Admission medications   Medication Sig Start Date End Date Taking? Authorizing Provider  acetaminophen (TYLENOL) 500 MG tablet Take 1,000 mg by mouth daily as needed (pain).   Yes Historical Provider, MD  aspirin EC 81 MG tablet Take 81 mg by mouth daily.   Yes Historical Provider, MD  carvedilol (COREG) 6.25 MG tablet Take 3.125 mg by mouth 2 (two) times daily with a meal.    Yes Historical Provider, MD  furosemide (LASIX) 40 MG tablet Take 20 mg by mouth daily.   Yes Historical Provider, MD  insulin aspart (NOVOLOG FLEXPEN) 100 UNIT/ML FlexPen Inject 5-10 Units into the skin 3 (three) times daily with meals. Based on sliding scale   Yes Historical Provider, MD  insulin glargine (LANTUS SOLOSTAR) 100 UNIT/ML injection Inject 20 Units into the skin at bedtime.   Yes Historical Provider, MD  isosorbide mononitrate (IMDUR) 60 MG 24 hr tablet Take 1 tablet (60 mg total) by mouth daily. 09/05/12  Yes Laverda Page, MD  losartan (COZAAR) 50 MG tablet Take 25 mg by mouth daily.   Yes Historical Provider, MD  nitroGLYCERIN (NITROSTAT) 0.4 MG SL tablet Place 0.4 mg under the tongue every 5 (five) minutes as needed for chest pain.   Yes Historical Provider, MD  pantoprazole (PROTONIX) 40 MG tablet Take 1 tablet (40 mg total) by mouth 2 (two) times daily before a meal. 07/24/13  Yes Orvil Feil, NP  ranolazine (RANEXA) 500 MG 12 hr tablet Take 1 tablet (500 mg total) by mouth 2 (two) times daily. 01/23/13  Yes Laverda Page, MD  simvastatin (ZOCOR) 20 MG tablet Take 1 tablet (20 mg total)  by mouth every evening. 08/15/12  Yes Susy Frizzle, MD  Vorapaxar Sulfate 2.08 MG TABS Take 2.08 mg by mouth daily.   Yes Historical Provider, MD  B-D ULTRAFINE III SHORT PEN 31G X 8 MM MISC USE AS DIRECTED WITH LANTUS PEN 11/21/12   Susy Frizzle, MD  Blood Glucose Monitoring Suppl (ACCU-CHEK NANO SMARTVIEW) W/DEVICE KIT Pt needs monitor, strips (box of 400/ 4 refill), lancets (box of 400/4 refills) She checks her BS qid (before meals & QHS) Dx: 250.00 02/13/13   Susy Frizzle, MD    I have reviewed the patient's current medications.  Labs:  Results for orders placed during the hospital encounter of 11/05/13 (from the past 48 hour(s))  CBC     Status: Abnormal   Collection Time    11/10/13  3:50 PM      Result Value Ref Range   WBC 15.1 (*) 4.0 - 10.5 K/uL   RBC 2.86 (*) 3.87 - 5.11 MIL/uL   Hemoglobin 8.7 (*) 12.0 - 15.0 g/dL   HCT 24.6 (*) 36.0 - 46.0 %   MCV 86.0  78.0 - 100.0 fL   MCH 30.4  26.0 - 34.0 pg   MCHC 35.4  30.0 - 36.0 g/dL   RDW 16.0 (*) 11.5 - 15.5 %   Platelets 60 (*) 150 - 400 K/uL   Comment: CONSISTENT WITH PREVIOUS RESULT  BASIC METABOLIC PANEL     Status: Abnormal   Collection Time    11/10/13  3:50 PM      Result Value Ref Range   Sodium 133 (*) 137 - 147 mEq/L   Potassium 5.2  3.7 - 5.3 mEq/L   Chloride 97  96 - 112 mEq/L  CO2 18 (*) 19 - 32 mEq/L   Glucose, Bld 177 (*) 70 - 99 mg/dL   BUN 38 (*) 6 - 23 mg/dL   Creatinine, Ser 3.06 (*) 0.50 - 1.10 mg/dL   Calcium 8.3 (*) 8.4 - 10.5 mg/dL   GFR calc non Af Amer 13 (*) >90 mL/min   GFR calc Af Amer 16 (*) >90 mL/min   Comment: (NOTE)     The eGFR has been calculated using the CKD EPI equation.     This calculation has not been validated in all clinical situations.     eGFR's persistently <90 mL/min signify possible Chronic Kidney     Disease.   Anion gap 18 (*) 5 - 15  GLUCOSE, CAPILLARY     Status: Abnormal   Collection Time    11/10/13  3:53 PM      Result Value Ref Range    Glucose-Capillary 175 (*) 70 - 99 mg/dL  POCT I-STAT 3, ART BLOOD GAS (G3+)     Status: Abnormal   Collection Time    11/10/13  5:59 PM      Result Value Ref Range   pH, Arterial 7.320 (*) 7.350 - 7.450   pCO2 arterial 36.0  35.0 - 45.0 mmHg   pO2, Arterial 64.0 (*) 80.0 - 100.0 mmHg   Bicarbonate 18.7 (*) 20.0 - 24.0 mEq/L   TCO2 20  0 - 100 mmol/L   O2 Saturation 92.0     Acid-base deficit 7.0 (*) 0.0 - 2.0 mmol/L   Patient temperature 36.3 C     Sample type ARTERIAL    GLUCOSE, CAPILLARY     Status: Abnormal   Collection Time    11/10/13  7:46 PM      Result Value Ref Range   Glucose-Capillary 148 (*) 70 - 99 mg/dL  GLUCOSE, CAPILLARY     Status: Abnormal   Collection Time    11/10/13 11:09 PM      Result Value Ref Range   Glucose-Capillary 160 (*) 70 - 99 mg/dL  GLUCOSE, CAPILLARY     Status: Abnormal   Collection Time    11/11/13  3:17 AM      Result Value Ref Range   Glucose-Capillary 140 (*) 70 - 99 mg/dL  CBC     Status: Abnormal   Collection Time    11/11/13  4:20 AM      Result Value Ref Range   WBC 15.4 (*) 4.0 - 10.5 K/uL   RBC 2.74 (*) 3.87 - 5.11 MIL/uL   Hemoglobin 8.0 (*) 12.0 - 15.0 g/dL   HCT 22.8 (*) 36.0 - 46.0 %   MCV 83.2  78.0 - 100.0 fL   MCH 29.2  26.0 - 34.0 pg   MCHC 35.1  30.0 - 36.0 g/dL   RDW 15.8 (*) 11.5 - 15.5 %   Platelets 62 (*) 150 - 400 K/uL   Comment: CONSISTENT WITH PREVIOUS RESULT  COMPREHENSIVE METABOLIC PANEL     Status: Abnormal   Collection Time    11/11/13  4:20 AM      Result Value Ref Range   Sodium 131 (*) 137 - 147 mEq/L   Potassium 4.8  3.7 - 5.3 mEq/L   Chloride 95 (*) 96 - 112 mEq/L   CO2 20  19 - 32 mEq/L   Glucose, Bld 165 (*) 70 - 99 mg/dL   BUN 43 (*) 6 - 23 mg/dL   Creatinine, Ser 3.41 (*) 0.50 - 1.10 mg/dL  Calcium 8.1 (*) 8.4 - 10.5 mg/dL   Total Protein 4.8 (*) 6.0 - 8.3 g/dL   Albumin 2.6 (*) 3.5 - 5.2 g/dL   AST 137 (*) 0 - 37 U/L   ALT 127 (*) 0 - 35 U/L   Alkaline Phosphatase 46  39 - 117 U/L    Total Bilirubin 1.0  0.3 - 1.2 mg/dL   GFR calc non Af Amer 12 (*) >90 mL/min   GFR calc Af Amer 14 (*) >90 mL/min   Comment: (NOTE)     The eGFR has been calculated using the CKD EPI equation.     This calculation has not been validated in all clinical situations.     eGFR's persistently <90 mL/min signify possible Chronic Kidney     Disease.   Anion gap 16 (*) 5 - 15  CARBOXYHEMOGLOBIN     Status: None   Collection Time    11/11/13  4:36 AM      Result Value Ref Range   Total hemoglobin 14.0  12.0 - 16.0 g/dL   O2 Saturation 49.7     Carboxyhemoglobin 1.2  0.5 - 1.5 %   Methemoglobin 0.8  0.0 - 1.5 %  CARBOXYHEMOGLOBIN     Status: Abnormal   Collection Time    11/11/13  5:04 AM      Result Value Ref Range   Total hemoglobin 7.8 (*) 12.0 - 16.0 g/dL   O2 Saturation 57.3     Carboxyhemoglobin 1.5  0.5 - 1.5 %   Methemoglobin 0.6  0.0 - 1.5 %  TYPE AND SCREEN     Status: None   Collection Time    11/11/13  8:18 AM      Result Value Ref Range   ABO/RH(D) O POS     Antibody Screen NEG     Sample Expiration 11/14/2013     Unit Number [id number]     Blood Component Type RED CELLS,LR     Unit division 00     Status of Unit ISSUED,FINAL     Transfusion Status OK TO TRANSFUSE     Crossmatch Result Compatible    PREPARE RBC (CROSSMATCH)     Status: None   Collection Time    11/11/13  8:18 AM      Result Value Ref Range   Order Confirmation ORDER PROCESSED BY BLOOD BANK    POCT I-STAT 3, ART BLOOD GAS (G3+)     Status: Abnormal   Collection Time    11/11/13  8:21 AM      Result Value Ref Range   pH, Arterial 7.381  7.350 - 7.450   pCO2 arterial 35.6  35.0 - 45.0 mmHg   pO2, Arterial 62.0 (*) 80.0 - 100.0 mmHg   Bicarbonate 21.1  20.0 - 24.0 mEq/L   TCO2 22  0 - 100 mmol/L   O2 Saturation 91.0     Acid-base deficit 4.0 (*) 0.0 - 2.0 mmol/L   Patient temperature 36.9 C     Sample type ARTERIAL    GLUCOSE, CAPILLARY     Status: Abnormal   Collection Time    11/11/13   8:25 AM      Result Value Ref Range   Glucose-Capillary 130 (*) 70 - 99 mg/dL  GLUCOSE, CAPILLARY     Status: Abnormal   Collection Time    11/11/13 12:58 PM      Result Value Ref Range   Glucose-Capillary 134 (*) 70 - 99 mg/dL  GLUCOSE,  CAPILLARY     Status: Abnormal   Collection Time    11/11/13  4:38 PM      Result Value Ref Range   Glucose-Capillary 135 (*) 70 - 99 mg/dL  CBC     Status: Abnormal   Collection Time    11/11/13  4:40 PM      Result Value Ref Range   WBC 13.8 (*) 4.0 - 10.5 K/uL   RBC 3.06 (*) 3.87 - 5.11 MIL/uL   Hemoglobin 9.2 (*) 12.0 - 15.0 g/dL   HCT 25.7 (*) 36.0 - 46.0 %   MCV 84.0  78.0 - 100.0 fL   MCH 30.1  26.0 - 34.0 pg   MCHC 35.8  30.0 - 36.0 g/dL   RDW 15.4  11.5 - 15.5 %   Platelets 59 (*) 150 - 400 K/uL   Comment: CONSISTENT WITH PREVIOUS RESULT  POCT I-STAT 3, ART BLOOD GAS (G3+)     Status: Abnormal   Collection Time    11/11/13  4:42 PM      Result Value Ref Range   pH, Arterial 7.318 (*) 7.350 - 7.450   pCO2 arterial 36.1  35.0 - 45.0 mmHg   pO2, Arterial 73.0 (*) 80.0 - 100.0 mmHg   Bicarbonate 18.5 (*) 20.0 - 24.0 mEq/L   TCO2 20  0 - 100 mmol/L   O2 Saturation 93.0     Acid-base deficit 7.0 (*) 0.0 - 2.0 mmol/L   Sample type ARTERIAL    GLUCOSE, CAPILLARY     Status: None   Collection Time    11/11/13  8:07 PM      Result Value Ref Range   Glucose-Capillary 80  70 - 99 mg/dL  CBC     Status: Abnormal   Collection Time    11/12/13  4:00 AM      Result Value Ref Range   WBC 12.4 (*) 4.0 - 10.5 K/uL   RBC 3.13 (*) 3.87 - 5.11 MIL/uL   Hemoglobin 9.3 (*) 12.0 - 15.0 g/dL   HCT 26.4 (*) 36.0 - 46.0 %   MCV 84.3  78.0 - 100.0 fL   MCH 29.7  26.0 - 34.0 pg   MCHC 35.2  30.0 - 36.0 g/dL   RDW 15.5  11.5 - 15.5 %   Platelets 68 (*) 150 - 400 K/uL   Comment: CONSISTENT WITH PREVIOUS RESULT  COMPREHENSIVE METABOLIC PANEL     Status: Abnormal   Collection Time    11/12/13  4:00 AM      Result Value Ref Range   Sodium 130 (*)  137 - 147 mEq/L   Potassium 4.1  3.7 - 5.3 mEq/L   Chloride 93 (*) 96 - 112 mEq/L   CO2 22  19 - 32 mEq/L   Glucose, Bld 42 (*) 70 - 99 mg/dL   Comment: CRITICAL RESULT CALLED TO, READ BACK BY AND VERIFIED WITH:     HAGGARD A,RN 11/12/13 0516 WAYK   BUN 51 (*) 6 - 23 mg/dL   Creatinine, Ser 3.86 (*) 0.50 - 1.10 mg/dL   Calcium 8.1 (*) 8.4 - 10.5 mg/dL   Total Protein 4.9 (*) 6.0 - 8.3 g/dL   Albumin 2.6 (*) 3.5 - 5.2 g/dL   AST 47 (*) 0 - 37 U/L   ALT 109 (*) 0 - 35 U/L   Alkaline Phosphatase 59  39 - 117 U/L   Total Bilirubin 0.9  0.3 - 1.2 mg/dL   GFR calc non  Af Amer 10 (*) >90 mL/min   GFR calc Af Amer 12 (*) >90 mL/min   Comment: (NOTE)     The eGFR has been calculated using the CKD EPI equation.     This calculation has not been validated in all clinical situations.     eGFR's persistently <90 mL/min signify possible Chronic Kidney     Disease.   Anion gap 15  5 - 15  CARBOXYHEMOGLOBIN     Status: Abnormal   Collection Time    11/12/13  5:15 AM      Result Value Ref Range   Total hemoglobin 9.1 (*) 12.0 - 16.0 g/dL   O2 Saturation 62.2     Carboxyhemoglobin 1.4  0.5 - 1.5 %   Methemoglobin 0.8  0.0 - 1.5 %  GLUCOSE, CAPILLARY     Status: None   Collection Time    11/12/13  7:56 AM      Result Value Ref Range   Glucose-Capillary 70  70 - 99 mg/dL  GLUCOSE, CAPILLARY     Status: None   Collection Time    11/12/13 11:43 AM      Result Value Ref Range   Glucose-Capillary 82  70 - 99 mg/dL     ROS:  A comprehensive review of systems was negative except for: Cardiovascular: positive for dyspnea and lower extremity edema  Physical Exam: Filed Vitals:   11/12/13 1400  BP: 137/40  Pulse: 80  Temp:   Resp: 23     General: Elderly but well appearing white female. She is alert, oriented and asking appropriate questions HEENT: Pupils are equal round reactive to light symmetrical motions are intact, mucous membranes are moist  Neck: There is no jugular venous  distention Heart: Irregular Lungs: decreased breath sounds at extreme bases  Abdomen: soft, nontender, nondistended.  Extremities: Pitting edema  Skin: Warm and dry Neuro: alert and oriented. The remainder of the neurologic exam is nonfocal   Assessment/Plan: 80 year old female with multiple medical issues including stage III PCKD who is status post CABG and mitral valve replacement with postoperative acute on chronic renal failure  1.Renal- acute kidney injury on chronic kidney disease in the setting of above.  The insult was most likely hemodynamic in nature/ATN and not unusual in this scenario.  Of note, her pre-operative urinalysis looked like a possible urinary tract infection. I will check another urinalysis today to evaluate. She does have a Foley catheter in place. Her urine output has been marginal for a few days. Fortunately there are no absolute indications for dialysis at this time. The fact that she did have CKD going into her operation will hamper her recovery. I did tell her that dialysis was a possibility but hopefully it would be short-term it was needed at all.  2. Hypertension/volume  - her CVP has been good. She still does appear to require some pressor support at this time. She is likely third spacing fluid. She is currently on Lasix 8 mg an hour which actually is not a huge dose. I will increase it slightly in an effort to induce a little bit more diuresis since her blood pressure is good. However, will try not to drop the CVP too much.  3 Anemia  -  situational at this time. Supportive care for now.  4. electrolytes are within normal limits   Thank you for this consult- I will follow with you   Alpha Mysliwiec A 11/12/2013, 2:13 PM

## 2013-11-12 NOTE — Progress Notes (Signed)
Hypoglycemic Event  CBG: 38  Treatment:  15oz OJ  Symptoms: diaphoretic  Follow-up CBG: Time: 4536 CBG Result: 46  Possible Reasons for Event: levemir dose   Comments/MD notified:    Bing Quarry  Remember to initiate Hypoglycemia Order Set & complete

## 2013-11-12 NOTE — Progress Notes (Signed)
Spoke with Dr. Prescott Gum regarding patient converting back to afib. Order for Amio 150mg  bolus and to restart protocol received. Will continue to monitor. Richarda Blade RN

## 2013-11-13 ENCOUNTER — Inpatient Hospital Stay (HOSPITAL_COMMUNITY): Payer: Medicare HMO

## 2013-11-13 LAB — BASIC METABOLIC PANEL
Anion gap: 12 (ref 5–15)
BUN: 56 mg/dL — ABNORMAL HIGH (ref 6–23)
CO2: 22 mEq/L (ref 19–32)
Calcium: 7.2 mg/dL — ABNORMAL LOW (ref 8.4–10.5)
Chloride: 90 mEq/L — ABNORMAL LOW (ref 96–112)
Creatinine, Ser: 3.42 mg/dL — ABNORMAL HIGH (ref 0.50–1.10)
GFR calc Af Amer: 14 mL/min — ABNORMAL LOW (ref >90)
GFR calc non Af Amer: 12 mL/min — ABNORMAL LOW (ref >90)
Glucose, Bld: 136 mg/dL — ABNORMAL HIGH (ref 70–99)
Potassium: 4.5 mEq/L (ref 3.7–5.3)
Sodium: 124 mEq/L — ABNORMAL LOW (ref 137–147)

## 2013-11-13 LAB — GLUCOSE, CAPILLARY
GLUCOSE-CAPILLARY: 46 mg/dL — AB (ref 70–99)
GLUCOSE-CAPILLARY: 52 mg/dL — AB (ref 70–99)
Glucose-Capillary: 115 mg/dL — ABNORMAL HIGH (ref 70–99)
Glucose-Capillary: 38 mg/dL — CL (ref 70–99)
Glucose-Capillary: 49 mg/dL — ABNORMAL LOW (ref 70–99)
Glucose-Capillary: 54 mg/dL — ABNORMAL LOW (ref 70–99)
Glucose-Capillary: 57 mg/dL — ABNORMAL LOW (ref 70–99)
Glucose-Capillary: 63 mg/dL — ABNORMAL LOW (ref 70–99)
Glucose-Capillary: 65 mg/dL — ABNORMAL LOW (ref 70–99)
Glucose-Capillary: 77 mg/dL (ref 70–99)
Glucose-Capillary: 78 mg/dL (ref 70–99)
Glucose-Capillary: 79 mg/dL (ref 70–99)
Glucose-Capillary: 84 mg/dL (ref 70–99)
Glucose-Capillary: 85 mg/dL (ref 70–99)

## 2013-11-13 LAB — CBC
HCT: 25.4 % — ABNORMAL LOW (ref 36.0–46.0)
Hemoglobin: 8.9 g/dL — ABNORMAL LOW (ref 12.0–15.0)
MCH: 29.7 pg (ref 26.0–34.0)
MCHC: 35 g/dL (ref 30.0–36.0)
MCV: 84.7 fL (ref 78.0–100.0)
Platelets: 64 10*3/uL — ABNORMAL LOW (ref 150–400)
RBC: 3 MIL/uL — ABNORMAL LOW (ref 3.87–5.11)
RDW: 15.3 % (ref 11.5–15.5)
WBC: 8.6 10*3/uL (ref 4.0–10.5)

## 2013-11-13 LAB — COMPREHENSIVE METABOLIC PANEL
ALT: 80 U/L — ABNORMAL HIGH (ref 0–35)
AST: 30 U/L (ref 0–37)
Albumin: 2.4 g/dL — ABNORMAL LOW (ref 3.5–5.2)
Alkaline Phosphatase: 65 U/L (ref 39–117)
Anion gap: 15 (ref 5–15)
BUN: 56 mg/dL — ABNORMAL HIGH (ref 6–23)
CO2: 23 mEq/L (ref 19–32)
Calcium: 8 mg/dL — ABNORMAL LOW (ref 8.4–10.5)
Chloride: 91 mEq/L — ABNORMAL LOW (ref 96–112)
Creatinine, Ser: 3.82 mg/dL — ABNORMAL HIGH (ref 0.50–1.10)
GFR calc Af Amer: 12 mL/min — ABNORMAL LOW (ref >90)
GFR calc non Af Amer: 10 mL/min — ABNORMAL LOW (ref >90)
Glucose, Bld: 54 mg/dL — ABNORMAL LOW (ref 70–99)
Potassium: 4.3 mEq/L (ref 3.7–5.3)
Sodium: 129 mEq/L — ABNORMAL LOW (ref 137–147)
Total Bilirubin: 0.7 mg/dL (ref 0.3–1.2)
Total Protein: 4.9 g/dL — ABNORMAL LOW (ref 6.0–8.3)

## 2013-11-13 LAB — CARBOXYHEMOGLOBIN
Carboxyhemoglobin: 1.4 % (ref 0.5–1.5)
Methemoglobin: 0.9 % (ref 0.0–1.5)
O2 Saturation: 55.6 %
Total hemoglobin: 8.9 g/dL — ABNORMAL LOW (ref 12.0–16.0)

## 2013-11-13 MED ORDER — GLUCERNA SHAKE PO LIQD
237.0000 mL | Freq: Every day | ORAL | Status: DC
  Administered 2013-11-13 – 2013-12-01 (×16): 237 mL via ORAL

## 2013-11-13 MED ORDER — DARBEPOETIN ALFA-POLYSORBATE 100 MCG/0.5ML IJ SOLN
100.0000 ug | Freq: Once | INTRAMUSCULAR | Status: AC
  Administered 2013-11-13: 100 ug via SUBCUTANEOUS
  Filled 2013-11-13: qty 0.5

## 2013-11-13 MED ORDER — AMIODARONE HCL 200 MG PO TABS
200.0000 mg | ORAL_TABLET | Freq: Two times a day (BID) | ORAL | Status: DC
  Administered 2013-11-13 – 2013-11-15 (×5): 200 mg via ORAL
  Filled 2013-11-13 (×6): qty 1

## 2013-11-13 MED ORDER — DEXTROSE 50 % IV SOLN: 25.0000 mL | Freq: Once | INTRAVENOUS | Status: AC | PRN

## 2013-11-13 MED ORDER — DEXTROSE 50 % IV SOLN
INTRAVENOUS | Status: AC
  Filled 2013-11-13: qty 50

## 2013-11-13 MED ORDER — LACTULOSE 10 GM/15ML PO SOLN
20.0000 g | Freq: Every day | ORAL | Status: DC
  Administered 2013-11-13 – 2013-11-20 (×4): 20 g via ORAL
  Filled 2013-11-13 (×14): qty 30

## 2013-11-13 MED ORDER — METOLAZONE 5 MG PO TABS
5.0000 mg | ORAL_TABLET | Freq: Every day | ORAL | Status: AC
  Administered 2013-11-13: 5 mg via ORAL
  Filled 2013-11-13: qty 1

## 2013-11-13 MED ORDER — DEXTROSE 50 % IV SOLN
25.0000 mL | Freq: Once | INTRAVENOUS | Status: AC
  Administered 2013-11-13: 25 mL via INTRAVENOUS
  Filled 2013-11-13: qty 50

## 2013-11-13 MED ORDER — DEXTROSE 50 % IV SOLN
INTRAVENOUS | Status: AC
  Administered 2013-11-13: 25 mL
  Filled 2013-11-13: qty 50

## 2013-11-13 MED ORDER — INSULIN DETEMIR 100 UNIT/ML ~~LOC~~ SOLN
10.0000 [IU] | Freq: Every day | SUBCUTANEOUS | Status: DC
  Administered 2013-11-13: 10 [IU] via SUBCUTANEOUS
  Filled 2013-11-13: qty 0.1

## 2013-11-13 MED ORDER — PROMETHAZINE HCL 25 MG/ML IJ SOLN
12.5000 mg | INTRAMUSCULAR | Status: DC | PRN
  Administered 2013-11-18: 6.25 mg via INTRAVENOUS
  Administered 2013-11-26: 12.5 mg via INTRAVENOUS
  Filled 2013-11-13 (×2): qty 1

## 2013-11-13 NOTE — Progress Notes (Signed)
Hypoglycemic Event  CBG: 63  Treatment: 15 GM carbohydrate snack  Symptoms: Pale and Sweaty  Follow-up CBG: [id number] CBG Result:54  Possible Reasons for Event: Unknown  Comments/MD notified:Treated per protocol    Billie Ruddy  Remember to initiate Hypoglycemia Order Set & complete

## 2013-11-13 NOTE — Progress Notes (Signed)
Subjective:  Some hypoglycemia overnight- tired sitting up in chair- kidney function stable- UOP increased on lasix- only on low dose dopamine and milrinone for other drips Objective Vital signs in last 24 hours: Filed Vitals:   11/13/13 0400 11/13/13 0500 11/13/13 0600 11/13/13 0700  BP: 128/39 129/36 141/50 136/46  Pulse: 72 73 75 74  Temp:      TempSrc:      Resp: 21 20 22 19   Height:      Weight:  107.8 kg (237 lb 10.5 oz)    SpO2: 100% 98% 100% 98%   Weight change: 2.5 kg (5 lb 8.2 oz)  Intake/Output Summary (Last 24 hours) at 11/13/13 0729 Last data filed at 11/13/13 0700  Gross per 24 hour  Intake 1185.2 ml  Output    775 ml  Net  410.2 ml    Assessment/Plan: 80 year old female with multiple medical issues including stage III CKD who is status post CABG and mitral valve replacement with postoperative acute on chronic renal failure  1.Renal- acute kidney injury on chronic kidney disease in the setting of above. The insult was most likely hemodynamic in nature/ATN and not unusual in this scenario. Of note, her pre-operative urinalysis looked like a possible urinary tract infection, but looks OK now. She does have a Foley catheter in place. Her urine output has been marginal for a few days but is picking up with the help of lasix. Fortunately there are no absolute indications for dialysis at this time. The fact that she did have CKD going into her operation will hamper her recovery. I did tell her that dialysis was a possibility but hopefully it would be short-term it was needed at all. Is stable today- hopefully plateau before improvement- continue to monitor 2. Hypertension/volume - her CVP has been good. She is on only low dose dopamine. She is likely third spacing fluid. She is currently on Lasix 80 mg q 6 hours and her BP is stable- continue with lasix for now 3 Anemia - situational and CKD- will give dose of aranesp - supportive care and PRN transfusion 4. electrolytes are  within normal limits  5. Hyponatremia- I think due to volume overload- should improve as volume status improves    Candice Tobey A    Labs: Basic Metabolic Panel:  Recent Labs Lab 11/12/13 0400 11/12/13 1500 11/13/13 0420  NA 130* 127* 129*  K 4.1 4.5 4.3  CL 93* 90* 91*  CO2 22 23 23   GLUCOSE 42* 107* 54*  BUN 51* 53* 56*  CREATININE 3.86* 3.73* 3.82*  CALCIUM 8.1* 7.8* 8.0*   Liver Function Tests:  Recent Labs Lab 11/11/13 0420 11/12/13 0400 11/13/13 0420  AST 137* 47* 30  ALT 127* 109* 80*  ALKPHOS 46 59 65  BILITOT 1.0 0.9 0.7  PROT 4.8* 4.9* 4.9*  ALBUMIN 2.6* 2.6* 2.4*   No results found for this basename: LIPASE, AMYLASE,  in the last 168 hours No results found for this basename: AMMONIA,  in the last 168 hours CBC:  Recent Labs Lab 11/10/13 1550 11/11/13 0420 11/11/13 1640 11/12/13 0400 11/13/13 0420  WBC 15.1* 15.4* 13.8* 12.4* 8.6  HGB 8.7* 8.0* 9.2* 9.3* 8.9*  HCT 24.6* 22.8* 25.7* 26.4* 25.4*  MCV 86.0 83.2 84.0 84.3 84.7  PLT 60* 62* 59* 68* 64*   Cardiac Enzymes:  Recent Labs Lab 11/06/13 1125  TROPONINI 0.66*   CBG:  Recent Labs Lab 11/12/13 1918 11/12/13 2330 11/13/13 0343 11/13/13 0416 11/13/13 [place]  99 91 63* 54* 79    Iron Studies: No results found for this basename: IRON, TIBC, TRANSFERRIN, FERRITIN,  in the last 72 hours Studies/Results: Dg Chest Port 1 View  11/12/2013   CLINICAL DATA:  Status post cardiac surgery.  EXAM: PORTABLE CHEST - 1 VIEW  COMPARISON:  11/11/2013.  FINDINGS: Central and lower lung zone atelectasis is without significant change from the previous day's study. Mild hazy basilar opacity suggests small effusions. No overt pulmonary edema.  Right and left chest tubes have been removed.  No pneumothorax.  No mediastinal widening. Cardiac silhouette is top-normal in size and stable.  Right internal jugular Swan-Ganz catheter has been removed. Right internal jugular Cordis remains in place.  Right subclavian central venous line is stable with its tip at the caval atrial junction. Another catheter extends from the abdomen to have its tip superimposed over the right peritracheal mediastinum, likely within the mid superior vena cava. This is stable.  IMPRESSION: 1. Persistent mid and lung base atelectasis. Probable small effusions. No substantial change from the previous day's study in lung aeration. No pneumothorax. 2. Status post removal of the Swan-Ganz catheter. Status post removal of bilateral chest tubes.   Electronically Signed   By: Lajean Manes M.D.   On: 11/12/2013 08:00   Medications: Infusions: . sodium chloride 20 mL/hr at 11/12/13 1900  . sodium chloride    . amiodarone 30 mg/hr (11/13/13 0000)  . DOPamine 3 mcg/kg/min (11/13/13 0000)  . epinephrine Stopped (11/12/13 1000)  . milrinone 0.3 mcg/kg/min (11/13/13 0000)  . norepinephrine (LEVOPHED) Adult infusion Stopped (11/12/13 1340)    Scheduled Medications: . sodium chloride   Intravenous Once  . sodium chloride   Intravenous Once  . sodium chloride   Intravenous Once  . acetaminophen  1,000 mg Oral 4 times per day   Or  . acetaminophen (TYLENOL) oral liquid 160 mg/5 mL  1,000 mg Per Tube 4 times per day  . bisacodyl  10 mg Oral Daily   Or  . bisacodyl  10 mg Rectal Daily  . cefTAZidime (FORTAZ)  IV  1 g Intravenous Q24H  . docusate  200 mg Oral Daily  . furosemide  80 mg Intravenous Q6H  . insulin aspart  0-24 Units Subcutaneous 6 times per day  . insulin detemir  10 Units Subcutaneous BID  . metoprolol tartrate  12.5 mg Oral BID   Or  . metoprolol tartrate  12.5 mg Per Tube BID  . pantoprazole (PROTONIX) IV  40 mg Intravenous Q24H    have reviewed scheduled and prn medications.  Physical Exam: General: a little less alert this AM, tired Heart: HR good Lungs: mostly clear Abdomen: soft,non tender Extremities: pitting edema    11/13/2013,7:29 AM  LOS: 8 days

## 2013-11-13 NOTE — Progress Notes (Addendum)
[address]       [place] [address]             [phone]      5 Days Post-Op Procedure(s) (LRB): CORONARY ARTERY BYPASS GRAFTING (CABG), on pump, times two, using left internal mammary artery, cryo saphenous vein. (N/A) INTRAOPERATIVE TRANSESOPHAGEAL ECHOCARDIOGRAM (N/A) MITRAL VALVE (MV) REPLACEMENT (N/A)  Subjective:  Ms. Claudia [name] complains of nausea this morning.  She took some Zofran which did provide relief.  Objective: Vital signs in last 24 hours: Temp:  [97.7 F (36.5 C)-99.5 F (37.5 C)] 97.7 F (36.5 C) (09/09 0740) Pulse Rate:  [72-85] 74 (09/09 0800) Cardiac Rhythm:  [-] Normal sinus rhythm (09/09 0800) Resp:  [15-23] 22 (09/09 0800) BP: (121-153)/(34-71) 121/40 mmHg (09/09 0800) SpO2:  [93 %-100 %] 99 % (09/09 0800) Arterial Line BP: (117-156)/(39-45) 117/39 mmHg (09/08 1500) Weight:  [237 lb 10.5 oz (107.8 kg)] 237 lb 10.5 oz (107.8 kg) (09/09 0500)  Hemodynamic parameters for last 24 hours: CVP:  [8 mmHg-15 mmHg] 15 mmHg  Intake/Output from previous day: 09/08 0701 - 09/09 0700 In: 1185.2 [I.V.:1185.2] Out: 775 [Urine:775] Intake/Output this shift: Total I/O In: 39.4 [I.V.:39.4] Out: -   General appearance: alert, cooperative and no distress Heart: regular rate and rhythm Lungs: diminished breath sounds bibasilar Abdomen: soft, non-tender; bowel sounds normal; no masses,  no organomegaly Extremities: edema 1-2+ Wound: clean, some drainage present from [facility] sites, most likely related to edema  Lab Results:  Recent Labs  11/12/13 0400 11/13/13 0420  WBC 12.4* 8.6  HGB 9.3* 8.9*  HCT 26.4* 25.4*  PLT 68* 64*   BMET:  Recent Labs  11/12/13 1500 11/13/13 0420  NA 127* 129*  K 4.5 4.3  CL 90* 91*  CO2 23 23  GLUCOSE 107* 54*  BUN 53* 56*  CREATININE 3.73* 3.82*  CALCIUM 7.8* 8.0*    PT/INR: No results found for this basename: LABPROT, INR,  in the last 72 hours ABG    Component Value Date/Time   PHART  7.318* 11/11/2013 1642   HCO3 18.5* 11/11/2013 1642   TCO2 20 11/11/2013 1642   ACIDBASEDEF 7.0* 11/11/2013 1642   O2SAT 55.6 11/13/2013 0408   CBG (last 3)   Recent Labs  11/13/13 0343 11/13/13 0416 11/13/13 0434  GLUCAP 63* 54* 79    Assessment/Plan: S/P Procedure(s) (LRB): CORONARY ARTERY BYPASS GRAFTING (CABG), on pump, times two, using left internal mammary artery, cryo saphenous vein. (N/A) INTRAOPERATIVE TRANSESOPHAGEAL ECHOCARDIOGRAM (N/A) MITRAL VALVE (MV) REPLACEMENT (N/A)  1. CV- remains hemodynamically stable, maintaining NSR- will transition Amiodarone to oral regimen, wean Milrinone and Dopamine as tolerated 2. Pulm- wean oxygen as tolerated, some dyspnea most likely exacerbated by Hypervolemia, good use of IS, CXR stable 3. Renal- creatinine remains elevated at 3.82, U/O improving with Lasix, Nephrology consulted and following 4. DM- morning sugars remains on low side, Levemir decreased to 10 U BID, may benefit from decreasing to once a day dose and continue sliding scale 5. Thrombocytopenia- decreased slightly to 64, will monitor 6. Dispo- patient stable, new onset nausea this morning, could be related to low blood glucose vs. Amiodarone, will start oral regimen of Amiodarone and follow nausea.  Decrease Levemir to once a day dose, wean drips as tolerated   LOS: 8 days    BARRETT, ERIN 11/13/2013  Patient not responding to high dose lasix- needs short term HD BP stable on renal dopamine Stop levemir for low glucose  patient  examined and medical record reviewed,agree with above note. VAN TRIGT III,Tata Timmins 11/13/2013

## 2013-11-13 NOTE — Progress Notes (Signed)
Hypoglycemic Event  CBG: 57  Treatment: 15 GM carbohydrate snack 4oz orange juice given  Symptoms: None  Follow-up CBG: Time:1700 CBG Result:52  Possible Reasons for Event: unknown  Comments: repeat hypoglycemia protocol, another 4oz orange juice given, will reassess within the hr    Lenox Ahr  Remember to initiate Hypoglycemia Order Set & complete

## 2013-11-13 NOTE — Progress Notes (Signed)
Hypoglycemic Event  CBG: 65  Treatment: D50 IV 25 mL  Symptoms: None  Follow-up CBG Time:1900    CBG Result:106  Possible Reasons for Event: Unknown    Claudia [name]  Remember to initiate Hypoglycemia Order Set & complete

## 2013-11-13 NOTE — Progress Notes (Signed)
NUTRITION FOLLOW UP  Intervention: Glucerna Shake po daily, each supplement provides 350 kcal and 13 grams of protein RD to follow for nutrition care plan  New Nutrition Dx: Increased nutrient needs related to post-op healing as evidenced by estimated nutrition needs, ongoing  New Goal: Pt to meet >/= 90% of their estimated nutrition needs, progressing  ASSESSMENT: 80 year old female with extensive cardiac history, admitted on 9/1 with chest pain.   Patient s/p procedures 9/5: CORONARY ARTERY BYPASS GRAFTING x 2  Patient extubated 9/6.  Diet advanced to Clear Liquids 9/7, Full Liquids 9/8.  PO intake ~ 50% per flowsheet records.  Nutrient needs increased given post-op state.  Would benefit from addition or oral nutrition supplement.  RD to order.  Height: Ht Readings from Last 1 Encounters:  11/05/13 5\' 7"  (1.702 m)    Weight: Wt Readings from Last 1 Encounters:  11/13/13 237 lb 10.5 oz (107.8 kg)    BMI:  Body mass index is 37.21 kg/(m^2).    Estimated Nutritional Needs: Kcal: 1700-1900 Protein: 90-100 gm  Fluid: 1.7-1.9 L  Skin: surgical incisions to legs and chest  Diet Order: Full Liquid   Intake/Output Summary (Last 24 hours) at 11/13/13 1356 Last data filed at 11/13/13 1300  Gross per 24 hour  Intake 1277.4 ml  Output    820 ml  Net  457.4 ml    Labs:   Recent Labs Lab 11/08/13 2156  11/09/13 0345  11/09/13 1530  11/12/13 0400 11/12/13 1500 11/13/13 0420  NA  --   < > 133*  < >  --   < > 130* 127* 129*  K  --   < > 4.1  < >  --   < > 4.1 4.5 4.3  CL  --   --  100  < >  --   < > 93* 90* 91*  CO2  --   --  19  < >  --   < > 22 23 23   BUN  --   --  28*  < >  --   < > 51* 53* 56*  CREATININE 1.54*  --  1.73*  < >  --   < > 3.86* 3.73* 3.82*  CALCIUM  --   --  9.1  < >  --   < > 8.1* 7.8* 8.0*  MG 2.8*  --  2.5  --  2.4  --   --   --   --   GLUCOSE  --   < > 118*  < >  --   < > 42* 107* 54*  < > = values in this interval not displayed.  CBG  (last 3)   Recent Labs  11/13/13 0434 11/13/13 0738 11/13/13 1142  GLUCAP 79 77 84    Scheduled Meds: . sodium chloride   Intravenous Once  . sodium chloride   Intravenous Once  . sodium chloride   Intravenous Once  . acetaminophen  1,000 mg Oral 4 times per day   Or  . acetaminophen (TYLENOL) oral liquid 160 mg/5 mL  1,000 mg Per Tube 4 times per day  . amiodarone  200 mg Oral BID  . bisacodyl  10 mg Oral Daily   Or  . bisacodyl  10 mg Rectal Daily  . cefTAZidime (FORTAZ)  IV  1 g Intravenous Q24H  . docusate  200 mg Oral Daily  . furosemide  80 mg Intravenous Q6H  . insulin aspart  0-24  Units Subcutaneous 6 times per day  . insulin detemir  10 Units Subcutaneous Daily  . lactulose  20 g Oral Daily  . metoprolol tartrate  12.5 mg Oral BID   Or  . metoprolol tartrate  12.5 mg Per Tube BID  . pantoprazole (PROTONIX) IV  40 mg Intravenous Q24H    Continuous Infusions: . sodium chloride 20 mL/hr at 11/12/13 1900  . sodium chloride    . DOPamine 3 mcg/kg/min (11/13/13 1300)  . milrinone 0.3 mcg/kg/min (11/13/13 1300)    Past Medical History  Diagnosis Date  . GERD (gastroesophageal reflux disease)   . PUD (peptic ulcer disease)   . Anemia   . Hypertension   . S/P endoscopy Aug 2011    3 superficial gastric ulcers, NSAID-induced  . S/P colonoscopy Sept 2011    left-sided diverticula, tubular adenoma  . Coronary artery disease   . Shortness of breath   . Diabetes mellitus     insulin dependent  . Headache(784.0)     rare migraines  . Cancer     hx of skin cancer  . Arthritis   . Osteopenia   . Hypercholesterolemia   . SIADH (syndrome of inappropriate ADH production)   . CHF (congestive heart failure)   . Myocardial infarction 2013    Past Surgical History  Procedure Laterality Date  . Tonsillectomy    . Appendectomy    . Eye surgery      cataracts  . Cardiac stents  07/19/2011  . Esophagogastroduodenoscopy  10/16/09    normal without barrett's/three  superficial gastric ulcers  . Colonoscopy  12/04/09    normal rectum/left-sided diverticula  . Joint replacement  arthroscopy to knee  . Toe amputation Left 2013    left second toe amputation  . Cardiac catheterization  01/22/2013  . Coronary angioplasty  07/2011  . Coronary artery bypass graft N/A 11/08/2013    Procedure: CORONARY ARTERY BYPASS GRAFTING (CABG), on pump, times two, using left internal mammary artery, cryo saphenous vein.;  Surgeon: Ivin Poot, MD;  Location: [place];  Service: Open Heart Surgery;  Laterality: N/A;  LIMA-LAD CRYOVEIN -OM  . Intraoperative transesophageal echocardiogram N/A 11/08/2013    Procedure: INTRAOPERATIVE TRANSESOPHAGEAL ECHOCARDIOGRAM;  Surgeon: Ivin Poot, MD;  Location: [place];  Service: Open Heart Surgery;  Laterality: N/A;  . Mitral valve replacement N/A 11/08/2013    Procedure: MITRAL VALVE (MV) REPLACEMENT;  Surgeon: Ivin Poot, MD;  Location: [place];  Service: Open Heart Surgery;  Laterality: N/A;  #25 MAGNA MITRAL EASE    Arthur Holms, RD, LDN Pager #: [phone] After-Hours Pager #: [phone]

## 2013-11-13 NOTE — Progress Notes (Signed)
Hypoglycemic Event  CBG: 54  Treatment: D50 IV 25 mL  Symptoms: Pale and Sweaty  Follow-up CBG: [id number] CBG Result:79  Possible Reasons for Event: Unknown  Comments/MD notified:Treated per protocol    Billie Ruddy  Remember to initiate Hypoglycemia Order Set & complete

## 2013-11-14 ENCOUNTER — Inpatient Hospital Stay (HOSPITAL_COMMUNITY): Payer: Medicare HMO

## 2013-11-14 LAB — CBC
HCT: 26.4 % — ABNORMAL LOW (ref 36.0–46.0)
Hemoglobin: 9.2 g/dL — ABNORMAL LOW (ref 12.0–15.0)
MCH: 29.7 pg (ref 26.0–34.0)
MCHC: 34.8 g/dL (ref 30.0–36.0)
MCV: 85.2 fL (ref 78.0–100.0)
Platelets: 86 10*3/uL — ABNORMAL LOW (ref 150–400)
RBC: 3.1 MIL/uL — ABNORMAL LOW (ref 3.87–5.11)
RDW: 15 % (ref 11.5–15.5)
WBC: 9.7 10*3/uL (ref 4.0–10.5)

## 2013-11-14 LAB — GLUCOSE, CAPILLARY
GLUCOSE-CAPILLARY: 106 mg/dL — AB (ref 70–99)
GLUCOSE-CAPILLARY: 148 mg/dL — AB (ref 70–99)
GLUCOSE-CAPILLARY: 148 mg/dL — AB (ref 70–99)
GLUCOSE-CAPILLARY: 179 mg/dL — AB (ref 70–99)
Glucose-Capillary: 136 mg/dL — ABNORMAL HIGH (ref 70–99)
Glucose-Capillary: 122 mg/dL — ABNORMAL HIGH (ref 70–99)
Glucose-Capillary: 130 mg/dL — ABNORMAL HIGH (ref 70–99)
Glucose-Capillary: 221 mg/dL — ABNORMAL HIGH (ref 70–99)
Glucose-Capillary: 201 mg/dL — ABNORMAL HIGH (ref 70–99)

## 2013-11-14 LAB — COMPREHENSIVE METABOLIC PANEL
ALT: 69 U/L — ABNORMAL HIGH (ref 0–35)
AST: 27 U/L (ref 0–37)
Albumin: 2.2 g/dL — ABNORMAL LOW (ref 3.5–5.2)
Alkaline Phosphatase: 75 U/L (ref 39–117)
Anion gap: 15 (ref 5–15)
BUN: 60 mg/dL — ABNORMAL HIGH (ref 6–23)
CO2: 21 mEq/L (ref 19–32)
Calcium: 7.8 mg/dL — ABNORMAL LOW (ref 8.4–10.5)
Chloride: 87 mEq/L — ABNORMAL LOW (ref 96–112)
Creatinine, Ser: 3.6 mg/dL — ABNORMAL HIGH (ref 0.50–1.10)
GFR calc Af Amer: 13 mL/min — ABNORMAL LOW (ref >90)
GFR calc non Af Amer: 11 mL/min — ABNORMAL LOW (ref >90)
Glucose, Bld: 132 mg/dL — ABNORMAL HIGH (ref 70–99)
Potassium: 4.5 mEq/L (ref 3.7–5.3)
Sodium: 123 mEq/L — ABNORMAL LOW (ref 137–147)
Total Bilirubin: 0.8 mg/dL (ref 0.3–1.2)
Total Protein: 4.8 g/dL — ABNORMAL LOW (ref 6.0–8.3)

## 2013-11-14 LAB — TYPE AND SCREEN
ABO/RH(D): O POS
Antibody Screen: NEGATIVE

## 2013-11-14 LAB — RENAL FUNCTION PANEL
Albumin: 2.2 g/dL — ABNORMAL LOW (ref 3.5–5.2)
Anion gap: 16 — ABNORMAL HIGH (ref 5–15)
BUN: 62 mg/dL — AB (ref 6–23)
CO2: 22 meq/L (ref 19–32)
CREATININE: 3.48 mg/dL — AB (ref 0.50–1.10)
Calcium: 8 mg/dL — ABNORMAL LOW (ref 8.4–10.5)
Chloride: 85 mEq/L — ABNORMAL LOW (ref 96–112)
GFR calc Af Amer: 13 mL/min — ABNORMAL LOW (ref >90)
GFR, EST NON AFRICAN AMERICAN: 12 mL/min — AB (ref >90)
Glucose, Bld: 168 mg/dL — ABNORMAL HIGH (ref 70–99)
Phosphorus: 5 mg/dL — ABNORMAL HIGH (ref 2.3–4.6)
Potassium: 4.9 mEq/L (ref 3.7–5.3)
Sodium: 123 mEq/L — ABNORMAL LOW (ref 137–147)

## 2013-11-14 MED ORDER — FUROSEMIDE 10 MG/ML IJ SOLN
160.0000 mg | Freq: Four times a day (QID) | INTRAVENOUS | Status: DC
  Administered 2013-11-14 – 2013-11-15 (×6): 160 mg via INTRAVENOUS
  Filled 2013-11-14 (×8): qty 16

## 2013-11-14 MED ORDER — SODIUM CHLORIDE 0.9 % IJ SOLN
10.0000 mL | INTRAMUSCULAR | Status: DC | PRN
  Administered 2013-11-15 – 2013-12-12 (×10): 10 mL
  Administered 2013-12-12: 20 mL

## 2013-11-14 MED ORDER — LEVALBUTEROL HCL 0.63 MG/3ML IN NEBU
0.6300 mg | INHALATION_SOLUTION | Freq: Four times a day (QID) | RESPIRATORY_TRACT | Status: DC | PRN
  Administered 2013-11-14 – 2013-11-17 (×2): 0.63 mg via RESPIRATORY_TRACT
  Filled 2013-11-14 (×2): qty 3

## 2013-11-14 MED ORDER — SODIUM CHLORIDE 0.9 % IJ SOLN
10.0000 mL | Freq: Two times a day (BID) | INTRAMUSCULAR | Status: DC
  Administered 2013-11-14 – 2013-11-20 (×9): 10 mL
  Administered 2013-11-21: 20 mL
  Administered 2013-11-22 – 2013-11-24 (×5): 10 mL
  Administered 2013-11-25: 12 mL
  Administered 2013-11-25 – 2013-12-06 (×18): 10 mL
  Administered 2013-12-06: 20 mL
  Administered 2013-12-07: 30 mL
  Administered 2013-12-07 – 2013-12-09 (×4): 10 mL
  Administered 2013-12-09: 20 mL
  Administered 2013-12-10: 10 mL

## 2013-11-14 NOTE — Progress Notes (Signed)
POD #5 CABG  Up in chair  BP 135/44  Pulse 81  Temp(Src) 97.3 F (36.3 C) (Oral)  Resp 27  Ht 5\' 7"  (1.702 m)  Wt 242 lb 8.1 oz (110 kg)  BMI 37.97 kg/m2  SpO2 100%   Intake/Output Summary (Last 24 hours) at 11/14/13 1708 Last data filed at 11/14/13 1607  Gross per 24 hour  Intake   1376 ml  Output   1520 ml  Net   -144 ml   UO has picked up today- continue to monitor

## 2013-11-14 NOTE — Progress Notes (Signed)
Subjective:  Some hypoglycemia continues overnight- tired sitting up in chair- kidney function stable- UOP stable as well on lasix- only on low dose dopamine and milrinone for other drips Objective Vital signs in last 24 hours: Filed Vitals:   11/14/13 0500 11/14/13 0600 11/14/13 0645 11/14/13 0700  BP: 138/48 138/49    Pulse: 84 87 88 85  Temp:      TempSrc:      Resp: 21 22 29 22   Height:      Weight:   110 kg (242 lb 8.1 oz)   SpO2: 94% 96% 95% 97%   Weight change: 2.2 kg (4 lb 13.6 oz)  Intake/Output Summary (Last 24 hours) at 11/14/13 0720 Last data filed at 11/14/13 0600  Gross per 24 hour  Intake 1735.5 ml  Output    955 ml  Net  780.5 ml    Assessment/Plan: 80 year old female with multiple medical issues including stage III CKD who is status post CABG and mitral valve replacement with postoperative acute on chronic renal failure  1.Renal- acute kidney injury on chronic kidney disease in the setting of above. The insult was most likely hemodynamic in nature/ATN and not unusual in this scenario. Of note, her pre-operative urinalysis looked like [name] possible urinary tract infection, but looks OK now. She does have [name] Foley catheter in place. Her urine output has been marginal for [name] few days but is picking up with the help of lasix. Fortunately there are no absolute indications for dialysis at this time. The fact that she did have CKD (crt 1.5) going into her operation will hamper her recovery. I did tell her that dialysis was [name] possibility but hopefully it would be short-term it was needed at all. Is stable today- hopefully plateau before improvement- continue to monitor 2. Hypertension/volume - her CVP has been good. She is on only low dose dopamine. She is likely third spacing fluid but is also very positive with hyponatremia. She is currently on Lasix 80 mg q 6 hours and her BP is stable- I am going to push it to 160 q 6 3 Anemia - situational and CKD- have given dose of aranesp -  supportive care and PRN transfusion 4. electrolytes are within normal limits  5. Hyponatremia- I think due to volume overload- worsening, will increase lasix    Claudia [name]    Labs: Basic Metabolic Panel:  Recent Labs Lab 11/13/13 0420 11/13/13 1930 11/14/13 0430  NA 129* 124* 123*  K 4.3 4.5 4.5  CL 91* 90* 87*  CO2 23 22 21   GLUCOSE 54* 136* 132*  BUN 56* 56* 60*  CREATININE 3.82* 3.42* 3.60*  CALCIUM 8.0* 7.2* 7.8*   Liver Function Tests:  Recent Labs Lab 11/12/13 0400 11/13/13 0420 11/14/13 0430  AST 47* 30 27  ALT 109* 80* 69*  ALKPHOS 59 65 75  BILITOT 0.9 0.7 0.8  PROT 4.9* 4.9* 4.8*  ALBUMIN 2.6* 2.4* 2.2*   No results found for this basename: LIPASE, AMYLASE,  in the last 168 hours No results found for this basename: AMMONIA,  in the last 168 hours CBC:  Recent Labs Lab 11/11/13 0420 11/11/13 1640 11/12/13 0400 11/13/13 0420 11/14/13 0430  WBC 15.4* 13.8* 12.4* 8.6 9.7  HGB 8.0* 9.2* 9.3* 8.9* 9.2*  HCT 22.8* 25.7* 26.4* 25.4* 26.4*  MCV 83.2 84.0 84.3 84.7 85.2  PLT 62* 59* 68* 64* 86*   Cardiac Enzymes: No results found for this basename: CKTOTAL, CKMB, CKMBINDEX, TROPONINI,  in the  last 168 hours CBG:  Recent Labs Lab 11/13/13 1848 11/13/13 1931 11/13/13 1940 11/13/13 2356 11/14/13 0344  GLUCAP 106* 78 136* 148* 130*    Iron Studies: No results found for this basename: IRON, TIBC, TRANSFERRIN, FERRITIN,  in the last 72 hours Studies/Results: Dg Chest Port 1 View  11/13/2013   CLINICAL DATA:  CABG.  Atelectasis .  EXAM: PORTABLE CHEST - 1 VIEW  COMPARISON:  11/12/2013.  FINDINGS: Right central line in stable position . Interval removal of right IJ line and line, rising from abdomen, most likely venous line. Mediastinum and hilar structures are stable. Stable cardiomegaly. Stable prominence of segment atelectasis throughout both lung fields. No significant pleural effusion. No pneumothorax. Prior CABG and cardiac valve  replacement. No acute bony abnormality.  IMPRESSION: 1. Interim removal of right IJ sheath. Interim removal of line arising from abdomen, most likely [name] venous line . Right subclavian central line in stable position. 2. Cardiomegaly. Prior CABG and cardiac valve replacement. No pulmonary venous congestion. 3. Stable prominent bile lateral multifocal subsegmental atelectasis.   Electronically Signed   By: Marcello Moores  Register   On: 11/13/2013 08:04   Medications: Infusions: . sodium chloride 20 mL/hr at 11/12/13 1900  . sodium chloride    . DOPamine 3 mcg/kg/min (11/14/13 0600)  . milrinone 0.3 mcg/kg/min (11/14/13 0600)    Scheduled Medications: . sodium chloride   Intravenous Once  . sodium chloride   Intravenous Once  . sodium chloride   Intravenous Once  . amiodarone  200 mg Oral BID  . bisacodyl  10 mg Oral Daily   Or  . bisacodyl  10 mg Rectal Daily  . cefTAZidime (FORTAZ)  IV  1 g Intravenous Q24H  . docusate  200 mg Oral Daily  . feeding supplement (GLUCERNA SHAKE)  237 mL Oral Q1500  . furosemide  80 mg Intravenous Q6H  . insulin aspart  0-24 Units Subcutaneous 6 times per day  . lactulose  20 g Oral Daily  . metoprolol tartrate  12.5 mg Oral BID   Or  . metoprolol tartrate  12.5 mg Per Tube BID  . pantoprazole (PROTONIX) IV  40 mg Intravenous Q24H    have reviewed scheduled and prn medications.  Physical Exam: General: [name] little less alert this AM, tired Heart: HR good Lungs: mostly clear Abdomen: soft,non tender Extremities: pitting edema    11/14/2013,7:20 AM  LOS: 9 days

## 2013-11-14 NOTE — Progress Notes (Signed)
Physical Therapy Treatment Patient Details Name: Claudia [name] MRN: [id number] DOB: 02/06/1932 Today's Date: 2013-11-18    History of Present Illness Pt adm with chest pain and underwent urgent CABG x 2 and MVR on 11/09/13. PMH - HTN, MI, DM    PT Comments    Pt making steady progress.  Follow Up Recommendations  SNF     Equipment Recommendations  None recommended by PT    Recommendations for Other Services       Precautions / Restrictions Precautions Precautions: Fall;Sternal    Mobility  Bed Mobility Overal bed mobility: Needs Assistance Bed Mobility: Supine to Sit     Supine to sit: +2 for physical assistance;Mod assist     General bed mobility comments: Assist to bring legs over and trunk up into sitting.  Transfers Overall transfer level: Needs assistance Equipment used: Ambulation equipment used;Rolling walker (2 wheeled) ([place]) Transfers: Sit to/from [facility] Sit to Stand: +2 physical assistance;Mod assist Stand pivot transfers: +2 physical assistance;Mod assist       General transfer comment: Verbal cues for sternal precautions and assist to bring hips up.  Ambulation/Gait Ambulation/Gait assistance: Mod assist;+2 safety/equipment Ambulation Distance (Feet): 8 Feet Assistive device: Rolling walker (2 wheeled) Gait Pattern/deviations: Step-to pattern;Decreased step length - right;Decreased step length - left;Trunk flexed;Shuffle Gait velocity: very slow Gait velocity interpretation: Below normal speed for age/gender General Gait Details: Verbal cues for pt to pick her feet up and incr step length. Cues to stand more erect. Pt c/o dizziness with amb. BP- decr initially after sitting but returned to baseline with chair reclined. Nurse aware.   Stairs            Wheelchair Mobility    Modified Rankin (Stroke Patients Only)       Balance Overall balance assessment: Needs assistance Sitting-balance support: No upper  extremity supported;Feet supported Sitting balance-Leahy Scale: Fair     Standing balance support: Bilateral upper extremity supported Standing balance-Leahy Scale: Poor Standing balance comment: Support of walker and mod A for initial standing due to posterior lean. Improved to min A.                    Cognition Arousal/Alertness: Awake/alert Behavior During Therapy: WFL for tasks assessed/performed Overall Cognitive Status: Within Functional Limits for tasks assessed                      Exercises      General Comments        Pertinent Vitals/Pain Pain Assessment: No/denies pain    Home Living                      Prior Function            PT Goals (current goals can now be found in the care plan section) Progress towards PT goals: Progressing toward goals    Frequency  Min 3X/week    PT Plan Current plan remains appropriate    Co-evaluation             End of Session Equipment Utilized During Treatment: Oxygen Activity Tolerance: Patient limited by fatigue Patient left: in chair;with call bell/phone within reach     Time: 8675-4492 PT Time Calculation (min): 25 min  Charges:  $Gait Training: 23-37 mins                    G Codes:      Shaley Leavens 2013/11/18,  4:46 PM  [facility] PT [phone]

## 2013-11-15 ENCOUNTER — Inpatient Hospital Stay (HOSPITAL_COMMUNITY): Payer: Medicare HMO

## 2013-11-15 LAB — CBC
HCT: 26.4 % — ABNORMAL LOW (ref 36.0–46.0)
Hemoglobin: 9.4 g/dL — ABNORMAL LOW (ref 12.0–15.0)
MCH: 29.9 pg (ref 26.0–34.0)
MCHC: 35.6 g/dL (ref 30.0–36.0)
MCV: 84.1 fL (ref 78.0–100.0)
Platelets: 126 10*3/uL — ABNORMAL LOW (ref 150–400)
RBC: 3.14 MIL/uL — ABNORMAL LOW (ref 3.87–5.11)
RDW: 14.8 % (ref 11.5–15.5)
WBC: 10.8 10*3/uL — ABNORMAL HIGH (ref 4.0–10.5)

## 2013-11-15 LAB — HEPARIN INDUCED THROMBOCYTOPENIA PNL
Heparin Induced Plt Ab: NEGATIVE
Patient O.D.: 0.054
UFH High Dose UFH H: 0 % Release
UFH Low Dose 0.1 IU/mL: 0 % Release
UFH Low Dose 0.5 IU/mL: 0 % Release
UFH SRA Result: NEGATIVE

## 2013-11-15 LAB — COMPREHENSIVE METABOLIC PANEL
ALT: 68 U/L — ABNORMAL HIGH (ref 0–35)
AST: 30 U/L (ref 0–37)
Albumin: 2.3 g/dL — ABNORMAL LOW (ref 3.5–5.2)
Alkaline Phosphatase: 79 U/L (ref 39–117)
Anion gap: 16 — ABNORMAL HIGH (ref 5–15)
BUN: 65 mg/dL — ABNORMAL HIGH (ref 6–23)
CO2: 23 mEq/L (ref 19–32)
Calcium: 8.1 mg/dL — ABNORMAL LOW (ref 8.4–10.5)
Chloride: 83 mEq/L — ABNORMAL LOW (ref 96–112)
Creatinine, Ser: 3.47 mg/dL — ABNORMAL HIGH (ref 0.50–1.10)
GFR calc Af Amer: 13 mL/min — ABNORMAL LOW (ref >90)
GFR calc non Af Amer: 12 mL/min — ABNORMAL LOW (ref >90)
Glucose, Bld: 129 mg/dL — ABNORMAL HIGH (ref 70–99)
Potassium: 4.5 mEq/L (ref 3.7–5.3)
Sodium: 122 mEq/L — ABNORMAL LOW (ref 137–147)
Total Bilirubin: 0.7 mg/dL (ref 0.3–1.2)
Total Protein: 5 g/dL — ABNORMAL LOW (ref 6.0–8.3)

## 2013-11-15 LAB — GLUCOSE, CAPILLARY
GLUCOSE-CAPILLARY: 134 mg/dL — AB (ref 70–99)
GLUCOSE-CAPILLARY: 173 mg/dL — AB (ref 70–99)
Glucose-Capillary: 112 mg/dL — ABNORMAL HIGH (ref 70–99)
Glucose-Capillary: 150 mg/dL — ABNORMAL HIGH (ref 70–99)
Glucose-Capillary: 157 mg/dL — ABNORMAL HIGH (ref 70–99)
Glucose-Capillary: 161 mg/dL — ABNORMAL HIGH (ref 70–99)

## 2013-11-15 MED ORDER — AMIODARONE IV BOLUS ONLY 150 MG/100ML
150.0000 mg | Freq: Once | INTRAVENOUS | Status: AC
  Administered 2013-11-15: 150 mg via INTRAVENOUS
  Filled 2013-11-15: qty 100

## 2013-11-15 MED ORDER — AMIODARONE HCL IN DEXTROSE 360-4.14 MG/200ML-% IV SOLN
30.0000 mg/h | INTRAVENOUS | Status: DC
  Administered 2013-11-16 – 2013-11-18 (×6): 30 mg/h via INTRAVENOUS
  Filled 2013-11-15 (×11): qty 200

## 2013-11-15 MED ORDER — ENOXAPARIN SODIUM 40 MG/0.4ML ~~LOC~~ SOLN
40.0000 mg | SUBCUTANEOUS | Status: DC
  Administered 2013-11-15: 40 mg via SUBCUTANEOUS
  Filled 2013-11-15: qty 0.4

## 2013-11-15 MED ORDER — NYSTATIN 100000 UNIT/GM EX CREA
TOPICAL_CREAM | Freq: Three times a day (TID) | CUTANEOUS | Status: DC
  Administered 2013-11-15 – 2013-11-19 (×11): via TOPICAL
  Administered 2013-11-19: 1 via TOPICAL
  Administered 2013-11-19 – 2013-11-20 (×3): via TOPICAL
  Administered 2013-11-20 – 2013-11-21 (×2): 1 via TOPICAL
  Administered 2013-11-21: 23:00:00 via TOPICAL
  Administered 2013-11-21: 1 via TOPICAL
  Administered 2013-11-22 (×3): via TOPICAL
  Administered 2013-11-23: 1 via TOPICAL
  Administered 2013-11-23 – 2013-11-24 (×4): via TOPICAL
  Administered 2013-11-24: 1 via TOPICAL
  Administered 2013-11-25 (×2): via TOPICAL
  Administered 2013-11-25 – 2013-11-26 (×2): 1 via TOPICAL
  Administered 2013-11-26 – 2013-11-28 (×7): via TOPICAL
  Administered 2013-11-28: 1 via TOPICAL
  Administered 2013-11-29 – 2013-11-30 (×6): via TOPICAL
  Administered 2013-12-01: 1 via TOPICAL
  Administered 2013-12-01 – 2013-12-02 (×3): via TOPICAL
  Administered 2013-12-02: 1 via TOPICAL
  Administered 2013-12-02 – 2013-12-04 (×7): via TOPICAL
  Administered 2013-12-05: 1 via TOPICAL
  Administered 2013-12-05 – 2013-12-06 (×4): via TOPICAL
  Administered 2013-12-07 (×2): 1 via TOPICAL
  Administered 2013-12-07 – 2013-12-13 (×17): via TOPICAL
  Filled 2013-11-15 (×5): qty 15

## 2013-11-15 MED ORDER — METOLAZONE 5 MG PO TABS
5.0000 mg | ORAL_TABLET | Freq: Once | ORAL | Status: AC
  Administered 2013-11-15: 5 mg via ORAL
  Filled 2013-11-15: qty 1

## 2013-11-15 MED ORDER — MAGIC MOUTHWASH
5.0000 mL | Freq: Three times a day (TID) | ORAL | Status: DC
  Administered 2013-11-15 – 2013-12-01 (×49): 5 mL via ORAL
  Administered 2013-12-02: 14:00:00 via ORAL
  Administered 2013-12-03 – 2013-12-13 (×25): 5 mL via ORAL
  Filled 2013-11-15 (×90): qty 5

## 2013-11-15 MED ORDER — AMIODARONE HCL IN DEXTROSE 360-4.14 MG/200ML-% IV SOLN
30.0000 mg/h | INTRAVENOUS | Status: AC
  Administered 2013-11-15: 30 mg/h via INTRAVENOUS
  Filled 2013-11-15: qty 200

## 2013-11-15 MED ORDER — SODIUM CHLORIDE 0.9 % IV SOLN
0.0400 mg/kg/h | INTRAVENOUS | Status: DC
  Filled 2013-11-15: qty 250

## 2013-11-15 MED ORDER — HEPARIN (PORCINE) IN NACL 100-0.45 UNIT/ML-% IJ SOLN
1800.0000 [IU]/h | INTRAMUSCULAR | Status: DC
  Administered 2013-11-15: 1000 [IU]/h via INTRAVENOUS
  Administered 2013-11-17 – 2013-11-19 (×3): 1600 [IU]/h via INTRAVENOUS
  Administered 2013-11-20: 1700 [IU]/h via INTRAVENOUS
  Filled 2013-11-15 (×13): qty 250

## 2013-11-15 MED ORDER — BIVALIRUDIN 250 MG IV SOLR: 0.0500 mg/kg/h | INTRAVENOUS | Status: DC

## 2013-11-15 NOTE — Progress Notes (Signed)
Patient ID: [name], female   DOB: February 03, 1932, 80 y.o.   MRN: [id number] EVENING ROUNDS NOTE :     [address]       [place] [address]             [phone]                 7 Days Post-Op Procedure(s) (LRB): CORONARY ARTERY BYPASS GRAFTING (CABG), on pump, times two, using left internal mammary artery, cryo saphenous vein. (N/A) INTRAOPERATIVE TRANSESOPHAGEAL ECHOCARDIOGRAM (N/A) MITRAL VALVE (MV) REPLACEMENT (N/A)  Total Length of Stay:  LOS: 10 days  BP 112/49  Pulse 54  Temp(Src) 98.3 F (36.8 C) (Oral)  Resp 18  Ht 5\' 7"  (1.702 m)  Wt 242 lb 15.2 oz (110.2 kg)  BMI 38.04 kg/m2  SpO2 97%  .Intake/Output     09/10 0701 - 09/11 0700 09/11 0701 - 09/12 0700   P.O. 1040 440   I.V. (mL/kg) 548.4 (5) 131 (1.2)   IV Piggyback 282 150   Total Intake(mL/kg) 1870.4 (17) 721 (6.5)   Urine (mL/kg/hr) 2715 (1) 295 (0.2)   Total Output 2715 295   Net -844.6 +426          . sodium chloride 20 mL/hr at 11/12/13 1900  . sodium chloride 10 mL (11/14/13 1817)  . DOPamine 3 mcg/kg/min (11/15/13 1700)  . milrinone 0.2 mcg/kg/min (11/15/13 1700)     Lab Results  Component Value Date   WBC 10.8* 11/15/2013   HGB 9.4* 11/15/2013   HCT 26.4* 11/15/2013   PLT 126* 11/15/2013   GLUCOSE 129* 11/15/2013   CHOL 125 12/03/2012   TRIG 42 12/03/2012   HDL 72 12/03/2012   LDLCALC 45 12/03/2012   ALT 68* 11/15/2013   AST 30 11/15/2013   NA 122* 11/15/2013   K 4.5 11/15/2013   CL 83* 11/15/2013   CREATININE 3.47* 11/15/2013   BUN 65* 11/15/2013   CO2 23 11/15/2013   TSH 0.596 12/02/2012   INR 2.02* 11/08/2013   HGBA1C 7.2* 11/06/2013   Has deteriorated some today, still sob, very significant edema both lower extremities Hyponatremia Cr increase to 3.47 Chronic Kidney Disease    Stage IV   GFR 15-29    Lab Results  Component Value Date   CREATININE 3.47* 11/15/2013   Estimated Creatinine Clearance: 16.8 ml/min (by C-G formula based on Cr of 3.47).     Grace Isaac MD  Beeper [phone] Office [phone] 11/15/2013 6:11 PM

## 2013-11-15 NOTE — Progress Notes (Signed)
Claudia [name] is very confused this morning. States she does not know her name, but can tell me her birthday. She says she "thinks" she has a son. She does not remember having surgery and she does not believe she is at the hospital. She says she can see "beetle-bugs" on the ceiling. She has made several attempts to get out of bed without assistance. When asked where she is going, she states "to buy a motor." Bed alarm on, bed in lowest position. Vital signs stable. Requested a Air cabin crew from the staffing office. Will continue to closely monitor. Richarda Blade RN

## 2013-11-15 NOTE — Progress Notes (Signed)
Subjective:  Noted confusion- and in A fib-  UOP is better- kidney function stable- sodium is still down Objective Vital signs in last 24 hours: Filed Vitals:   11/15/13 0600 11/15/13 0700 11/15/13 0800 11/15/13 0823  BP: 133/58 111/72 120/93   Pulse: 104 63 125   Temp:    97.9 F (36.6 C)  TempSrc:    Oral  Resp: 20 29 25    Height:      Weight:      SpO2: 98% 95% 99%    Weight change: 0.2 kg (7.1 oz)  Intake/Output Summary (Last 24 hours) at 11/15/13 0843 Last data filed at 11/15/13 0800  Gross per 24 hour  Intake 1810.4 ml  Output   2715 ml  Net -904.6 ml    Assessment/Plan: 80 year old female with multiple medical issues including stage III CKD who is status post CABG and mitral valve replacement with postoperative acute on chronic renal failure  1.Renal- acute kidney injury on chronic kidney disease in the setting of above. The insult was most likely hemodynamic in nature/ATN and not unusual in this scenario. Of note, her pre-operative urinalysis looked like a possible urinary tract infection, but looks OK now. She does have a Foley catheter in place. Her urine output is much better on lasix. Fortunately there are no absolute indications for dialysis at this time. The fact that she did have CKD (crt 1.5) going into her operation will hamper her recovery. I did tell her that dialysis was a possibility but hopefully it would be short-term if was needed at all. Is stable today- hopefully plateau before improvement- continue to monitor 2. Hypertension/volume - her CVP has been good. She is on only low dose dopamine. She is likely third spacing fluid but is also very positive with hyponatremia. Diuresing better on lasix 160 q 6- will give one dose of zaroxolyn today 3 Anemia - situational and CKD- have given dose of aranesp - supportive care and PRN transfusion 4. electrolytes are within normal limits  5. Hyponatremia- I think due to volume overload- worsening, hopefully will see improve  as diuresis continues 6. Confusion- sodium ? ICU? Supportive care    Walt Geathers A    Labs: Basic Metabolic Panel:  Recent Labs Lab 11/14/13 0430 11/14/13 1500 11/15/13 0330  NA 123* 123* 122*  K 4.5 4.9 4.5  CL 87* 85* 83*  CO2 21 22 23   GLUCOSE 132* 168* 129*  BUN 60* 62* 65*  CREATININE 3.60* 3.48* 3.47*  CALCIUM 7.8* 8.0* 8.1*  PHOS  --  5.0*  --    Liver Function Tests:  Recent Labs Lab 11/13/13 0420 11/14/13 0430 11/14/13 1500 11/15/13 0330  AST 30 27  --  30  ALT 80* 69*  --  68*  ALKPHOS 65 75  --  79  BILITOT 0.7 0.8  --  0.7  PROT 4.9* 4.8*  --  5.0*  ALBUMIN 2.4* 2.2* 2.2* 2.3*   No results found for this basename: LIPASE, AMYLASE,  in the last 168 hours No results found for this basename: AMMONIA,  in the last 168 hours CBC:  Recent Labs Lab 11/11/13 1640 11/12/13 0400 11/13/13 0420 11/14/13 0430 11/15/13 0330  WBC 13.8* 12.4* 8.6 9.7 10.8*  HGB 9.2* 9.3* 8.9* 9.2* 9.4*  HCT 25.7* 26.4* 25.4* 26.4* 26.4*  MCV 84.0 84.3 84.7 85.2 84.1  PLT 59* 68* 64* 86* 126*   Cardiac Enzymes: No results found for this basename: CKTOTAL, CKMB, CKMBINDEX, TROPONINI,  in the last  168 hours CBG:  Recent Labs Lab 11/14/13 1252 11/14/13 1609 11/14/13 1925 11/14/13 2341 11/15/13 0356  GLUCAP 221* 179* 201* 150* 134*    Iron Studies: No results found for this basename: IRON, TIBC, TRANSFERRIN, FERRITIN,  in the last 72 hours Studies/Results: Dg Chest Port 1 View  11/15/2013   CLINICAL DATA:  Post CABG  EXAM: PORTABLE CHEST - 1 VIEW  COMPARISON:  Portable exam 0624 hr compared to 11/14/2013  FINDINGS: RIGHT subclavian central venous catheter with tip projecting over cavoatrial junction.  Enlargement of cardiac silhouette post CABG and MVR.  Pulmonary vascular congestion.  Diffuse pulmonary edema, slightly increased.  Scattered atelectasis in mid to lower lungs bilaterally.  Questionable small BILATERAL pleural effusions.  No pneumothorax.   IMPRESSION: Slightly increased pulmonary edema.  Persistent atelectasis in mid to lower lungs bilaterally.   Electronically Signed   By: Lavonia Dana M.D.   On: 11/15/2013 07:39   Dg Chest Port 1 View  11/14/2013   CLINICAL DATA:  Cardiac valve replacement.  EXAM: PORTABLE CHEST - 1 VIEW  COMPARISON:  11/13/2013.  11/11/2013.  FINDINGS: Right subclavian central line in stable position. Mediastinum and hilar structures are normal. Stable cardiomegaly. Prior cardiac valve replacement. Bilateral multifocal subsegmental atelectasis has improved. Mild increase in interstitial markings noted. Congestive heart failure cannot be excluded. Small left pleural effusion cannot be excluded. No pneumothorax.  IMPRESSION: 1. Right subclavian central line in stable position. 2. Stable cardiomegaly.  Prior cardiac valve replacement. 3. Improving bilateral multifocal subsegmental atelectasis . 4. Increased interstitial markings. Probable small left pleural effusion. Mild congestive heart failure cannot be excluded.   Electronically Signed   By: Marcello Moores  Register   On: 11/14/2013 07:55   Medications: Infusions: . sodium chloride 20 mL/hr at 11/12/13 1900  . sodium chloride 10 mL (11/14/13 1817)  . DOPamine 3 mcg/kg/min (11/15/13 0800)  . milrinone 0.2 mcg/kg/min (11/15/13 0800)    Scheduled Medications: . sodium chloride   Intravenous Once  . sodium chloride   Intravenous Once  . sodium chloride   Intravenous Once  . amiodarone  150 mg Intravenous Once  . amiodarone  200 mg Oral BID  . bisacodyl  10 mg Oral Daily   Or  . bisacodyl  10 mg Rectal Daily  . cefTAZidime (FORTAZ)  IV  1 g Intravenous Q24H  . docusate  200 mg Oral Daily  . feeding supplement (GLUCERNA SHAKE)  237 mL Oral Q1500  . furosemide  160 mg Intravenous Q6H  . insulin aspart  0-24 Units Subcutaneous 6 times per day  . lactulose  20 g Oral Daily  . metoprolol tartrate  12.5 mg Oral BID   Or  . metoprolol tartrate  12.5 mg Per Tube BID  .  pantoprazole (PROTONIX) IV  40 mg Intravenous Q24H  . sodium chloride  10-40 mL Intracatheter Q12H    have reviewed scheduled and prn medications.  Physical Exam: General: reportedly confused- making some sense to me this AM Heart: tachy irreg Lungs: mostly clear Abdomen: soft,non tender Extremities: pitting edema    11/15/2013,8:43 AM  LOS: 10 days

## 2013-11-15 NOTE — Progress Notes (Signed)
Clinical Social Work Department BRIEF PSYCHOSOCIAL ASSESSMENT 11/15/2013  Patient:  Claudia [name], Claudia [name]     Account Number:  [id number]     [place] date:  11/05/2013  Clinical Social Worker:  Adair Laundry  Date/Time:  11/15/2013 03:20 PM  Referred by:  Physician  Date Referred:  11/15/2013 Referred for  SNF Placement   Other Referral:   Interview type:  Family Other interview type:   Spoke with pt son and daughter-in-law over the phone    PSYCHOSOCIAL DATA Living Status:  WITH ADULT CHILDREN Admitted from facility:   Level of care:   Primary support name:  Catha Gosselin Primary support relationship to patient:  CHILD, ADULT Degree of support available:   Pt has strong family support    CURRENT CONCERNS Current Concerns  Post-Acute Placement   Other Concerns:    SOCIAL WORK ASSESSMENT / PLAN CSW aware of recommendation for SNF and that pt is confused at this time. CSW called pt son and spoke with pt son and daughter-in-law on speaker phone. CSW introduced self and explained recommendation. Pt DIL informed CSW that prior to admission pt was at home with herself and pt son. Both pt son and DIL were unsure of process of SNF and what ST rehab would consist of. CSW explained ST rehab as well as referral process. Pt son and DIL expressed hesitation since pt is still in ICU and they are unclear of dc date. CSW expressed importance of sending out SNF referral early to allow insurance and facilities time to review. CSW explaind that sending out referral would not committ them to this dc plan. Pt son stated this information was helpful and they were agreeable to CSW sending referral to [facility]. Pt son and DIL did state that they would want to discuss this with MD prior to making any final decisions. CSW informed family that unit CSW would follow up next week.   Assessment/plan status:  Psychosocial Support/Ongoing Assessment of Needs Other assessment/ plan:    Information/referral to community resources:   SNF list to be provided with bed offers    PATIENT'S/FAMILY'S RESPONSE TO PLAN OF CARE: Pt son and DIL unsure of dc plan at this time but are agreeable to referral being sent to SNFs.       [place], [place]

## 2013-11-15 NOTE — Progress Notes (Signed)
Urine output decreased to 20-30 cc/hr no response from lasix .Dr Moshe Cipro informed.bladder scan completed per request with 0 volume noted

## 2013-11-15 NOTE — Progress Notes (Addendum)
ANTICOAGULATION CONSULT NOTE - Initial Consult  Pharmacy Consult:  Heparin Indication: Afib  Allergies  Allergen Reactions  . Aspirin Other (See Comments)    High doses caused stomach bleeds    Patient Measurements: Height: 5\' 7"  (170.2 cm) Weight: 242 lb 15.2 oz (110.2 kg) IBW/kg (Calculated) : 61.6 Heparin dosing weight: 87 kg  Vital Signs: Temp: 98.3 F (36.8 C) (09/11 1603) Temp src: Oral (09/11 1603) BP: 102/54 mmHg (09/11 1900) Pulse Rate: 80 (09/11 1900)  Labs:  Recent Labs  11/13/13 0420  11/14/13 0430 11/14/13 1500 11/15/13 0330  HGB 8.9*  --  9.2*  --  9.4*  HCT 25.4*  --  26.4*  --  26.4*  PLT 64*  --  86*  --  126*  CREATININE 3.82*  < > 3.60* 3.48* 3.47*  < > = values in this interval not displayed.  Estimated Creatinine Clearance: 16.8 ml/min (by C-G formula based on Cr of 3.47).   Medical History: Past Medical History  Diagnosis Date  . GERD (gastroesophageal reflux disease)   . PUD (peptic ulcer disease)   . Anemia   . Hypertension   . S/P endoscopy Aug 2011    3 superficial gastric ulcers, NSAID-induced  . S/P colonoscopy Sept 2011    left-sided diverticula, tubular adenoma  . Coronary artery disease   . Shortness of breath   . Diabetes mellitus     insulin dependent  . Headache(784.0)     rare migraines  . Cancer     hx of skin cancer  . Arthritis   . Osteopenia   . Hypercholesterolemia   . SIADH (syndrome of inappropriate ADH production)   . CHF (congestive heart failure)   . Myocardial infarction 2013      Assessment: 49 YOF s/p CABG with MVR to start IV heparin for Afib.  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  Aware patient received a dose of SQ Lovenox this AM.  Her platelet count is recovering.   Goal of Therapy:  HL 0.3-0.4 units/mL per Dr. Prescott Gum Monitor platelets by anticoagulation protocol: Yes    Plan:  - Heparin gtt at 1000 units/hr, no bolus - Check 8 hr HL - Daily HL / CBC  - PT / INR in  AM    Jinx Gilden D. Mina Marble, PharmD, BCPS Pager:  [phone] 11/15/2013, 8:09 PM

## 2013-11-16 ENCOUNTER — Inpatient Hospital Stay (HOSPITAL_COMMUNITY): Payer: Medicare HMO

## 2013-11-16 LAB — COMPREHENSIVE METABOLIC PANEL
ALT: 62 U/L — ABNORMAL HIGH (ref 0–35)
AST: 25 U/L (ref 0–37)
Albumin: 2.3 g/dL — ABNORMAL LOW (ref 3.5–5.2)
Alkaline Phosphatase: 93 U/L (ref 39–117)
Anion gap: 16 — ABNORMAL HIGH (ref 5–15)
BUN: 71 mg/dL — ABNORMAL HIGH (ref 6–23)
CO2: 23 mEq/L (ref 19–32)
Calcium: 8.2 mg/dL — ABNORMAL LOW (ref 8.4–10.5)
Chloride: 83 mEq/L — ABNORMAL LOW (ref 96–112)
Creatinine, Ser: 3.57 mg/dL — ABNORMAL HIGH (ref 0.50–1.10)
GFR calc Af Amer: 13 mL/min — ABNORMAL LOW (ref >90)
GFR calc non Af Amer: 11 mL/min — ABNORMAL LOW (ref >90)
Glucose, Bld: 124 mg/dL — ABNORMAL HIGH (ref 70–99)
Potassium: 4.5 mEq/L (ref 3.7–5.3)
Sodium: 122 mEq/L — ABNORMAL LOW (ref 137–147)
Total Bilirubin: 0.7 mg/dL (ref 0.3–1.2)
Total Protein: 5.1 g/dL — ABNORMAL LOW (ref 6.0–8.3)

## 2013-11-16 LAB — GLUCOSE, CAPILLARY
GLUCOSE-CAPILLARY: 130 mg/dL — AB (ref 70–99)
GLUCOSE-CAPILLARY: 163 mg/dL — AB (ref 70–99)
GLUCOSE-CAPILLARY: 184 mg/dL — AB (ref 70–99)
Glucose-Capillary: 124 mg/dL — ABNORMAL HIGH (ref 70–99)
Glucose-Capillary: 119 mg/dL — ABNORMAL HIGH (ref 70–99)
Glucose-Capillary: 218 mg/dL — ABNORMAL HIGH (ref 70–99)

## 2013-11-16 LAB — CARBOXYHEMOGLOBIN
Carboxyhemoglobin: 1.6 % — ABNORMAL HIGH (ref 0.5–1.5)
Methemoglobin: 0.8 % (ref 0.0–1.5)
O2 Saturation: 60.9 %
Total hemoglobin: 9.4 g/dL — ABNORMAL LOW (ref 12.0–16.0)

## 2013-11-16 LAB — CBC
HCT: 26.8 % — ABNORMAL LOW (ref 36.0–46.0)
Hemoglobin: 9.3 g/dL — ABNORMAL LOW (ref 12.0–15.0)
MCH: 29.2 pg (ref 26.0–34.0)
MCHC: 34.7 g/dL (ref 30.0–36.0)
MCV: 84 fL (ref 78.0–100.0)
Platelets: 162 10*3/uL (ref 150–400)
RBC: 3.19 MIL/uL — ABNORMAL LOW (ref 3.87–5.11)
RDW: 14.8 % (ref 11.5–15.5)
WBC: 8.6 10*3/uL (ref 4.0–10.5)

## 2013-11-16 LAB — PROTIME-INR
INR: 1.19 (ref 0.00–1.49)
Prothrombin Time: 15.1 seconds (ref 11.6–15.2)

## 2013-11-16 LAB — HEPARIN LEVEL (UNFRACTIONATED)
Heparin Unfractionated: <0.1 IU/mL — ABNORMAL LOW (ref 0.30–0.70)
Heparin Unfractionated: <0.1 IU/mL — ABNORMAL LOW (ref 0.30–0.70)

## 2013-11-16 MED ORDER — FUROSEMIDE 10 MG/ML IJ SOLN
160.0000 mg | Freq: Four times a day (QID) | INTRAVENOUS | Status: DC
  Administered 2013-11-16 – 2013-11-28 (×48): 160 mg via INTRAVENOUS
  Filled 2013-11-16 (×54): qty 16

## 2013-11-16 MED ORDER — METOLAZONE 5 MG PO TABS
5.0000 mg | ORAL_TABLET | Freq: Every day | ORAL | Status: DC
  Administered 2013-11-16 – 2013-11-23 (×8): 5 mg via ORAL
  Filled 2013-11-16 (×11): qty 1

## 2013-11-16 NOTE — Progress Notes (Signed)
Subjective:  Patient is clearer today- had marginal UOP yest but this AM is great- ended up even for the day- numbers have not changed too much Objective Vital signs in last 24 hours: Filed Vitals:   11/16/13 0500 11/16/13 0600 11/16/13 0630 11/16/13 0700  BP: 140/54 136/51  126/38  Pulse: 78 78  81  Temp:      TempSrc:      Resp: 16 15  17   Height:      Weight:   110.1 kg (242 lb 11.6 oz)   SpO2: 99% 99%  98%   Weight change: -0.1 kg (-3.5 oz)  Intake/Output Summary (Last 24 hours) at 11/16/13 0731 Last data filed at 11/16/13 0700  Gross per 24 hour  Intake 1341.7 ml  Output   1031 ml  Net  310.7 ml    Assessment/Plan: 80 year old female with multiple medical issues including stage III CKD who is status post CABG and mitral valve replacement with postoperative acute on chronic renal failure  1.Renal- acute kidney injury on chronic kidney disease in the setting of above. The insult was most likely hemodynamic in nature/ATN and not unusual in this scenario. Of note, her pre-operative urinalysis looked like a possible urinary tract infection, but looks OK now. She does have a Foley catheter in place. Her urine output is much better this AM.  I think that her kidneys don't tolerate the Afib very well.  Fortunately there are no absolute indications for dialysis at this time. The fact that she did have CKD (crt 1.5) going into her operation will hamper her recovery. I did tell her that dialysis was a possibility but hopefully it would be short-term if was needed at all. Is stable again today- will resume high dose lasix while UOP is there 2. Hypertension/volume - her CVP has been good. She is on only low dose dopamine. She is likely third spacing fluid but is also very positive with hyponatremia. Back on lasix 160 q 6- and zarox 5 daily 3 Anemia - situational and CKD- have given dose of aranesp - supportive care and PRN transfusion 4. electrolytes are within normal limits  5. Hyponatremia-   due to volume overload- stable, hopefully will see improve as diuresis continues 6. Confusion- sodium ? ICU? Supportive care- is better today    Reid Regas A    Labs: Basic Metabolic Panel:  Recent Labs Lab 11/14/13 0430 11/14/13 1500 11/15/13 0330 11/16/13 0515  NA 123* 123* 122* 122*  K 4.5 4.9 4.5 4.5  CL 87* 85* 83* 83*  CO2 21 22 23 23   GLUCOSE 132* 168* 129* 124*  BUN 60* 62* 65* 71*  CREATININE 3.60* 3.48* 3.47* 3.57*  CALCIUM 7.8* 8.0* 8.1* 8.2*  PHOS  --  5.0*  --   --    Liver Function Tests:  Recent Labs Lab 11/14/13 0430 11/14/13 1500 11/15/13 0330 11/16/13 0515  AST 27  --  30 25  ALT 69*  --  68* 62*  ALKPHOS 75  --  79 93  BILITOT 0.8  --  0.7 0.7  PROT 4.8*  --  5.0* 5.1*  ALBUMIN 2.2* 2.2* 2.3* 2.3*   No results found for this basename: LIPASE, AMYLASE,  in the last 168 hours No results found for this basename: AMMONIA,  in the last 168 hours CBC:  Recent Labs Lab 11/12/13 0400 11/13/13 0420 11/14/13 0430 11/15/13 0330 11/16/13 0515  WBC 12.4* 8.6 9.7 10.8* 8.6  HGB 9.3* 8.9* 9.2* 9.4* 9.3*  HCT 26.4* 25.4* 26.4* 26.4* 26.8*  MCV 84.3 84.7 85.2 84.1 84.0  PLT 68* 64* 86* 126* 162   Cardiac Enzymes: No results found for this basename: CKTOTAL, CKMB, CKMBINDEX, TROPONINI,  in the last 168 hours CBG:  Recent Labs Lab 11/15/13 1158 11/15/13 1602 11/15/13 1942 11/15/13 2351 11/16/13 0400  GLUCAP 161* 173* 157* 130* 124*    Iron Studies: No results found for this basename: IRON, TIBC, TRANSFERRIN, FERRITIN,  in the last 72 hours Studies/Results: Dg Chest Port 1 View  11/15/2013   CLINICAL DATA:  Post CABG  EXAM: PORTABLE CHEST - 1 VIEW  COMPARISON:  Portable exam 0624 hr compared to 11/14/2013  FINDINGS: RIGHT subclavian central venous catheter with tip projecting over cavoatrial junction.  Enlargement of cardiac silhouette post CABG and MVR.  Pulmonary vascular congestion.  Diffuse pulmonary edema, slightly increased.   Scattered atelectasis in mid to lower lungs bilaterally.  Questionable small BILATERAL pleural effusions.  No pneumothorax.  IMPRESSION: Slightly increased pulmonary edema.  Persistent atelectasis in mid to lower lungs bilaterally.   Electronically Signed   By: Lavonia Dana M.D.   On: 11/15/2013 07:39   Medications: Infusions: . sodium chloride 20 mL/hr at 11/12/13 1900  . sodium chloride 10 mL (11/14/13 1817)  . amiodarone 30 mg/hr (11/16/13 0618)  . DOPamine 3 mcg/kg/min (11/15/13 1900)  . heparin 1,300 Units/hr (11/16/13 0618)  . milrinone 0.2 mcg/kg/min (11/15/13 2147)    Scheduled Medications: . sodium chloride   Intravenous Once  . sodium chloride   Intravenous Once  . sodium chloride   Intravenous Once  . bisacodyl  10 mg Oral Daily   Or  . bisacodyl  10 mg Rectal Daily  . cefTAZidime (FORTAZ)  IV  1 g Intravenous Q24H  . docusate  200 mg Oral Daily  . feeding supplement (GLUCERNA SHAKE)  237 mL Oral Q1500  . insulin aspart  0-24 Units Subcutaneous 6 times per day  . lactulose  20 g Oral Daily  . magic mouthwash  5 mL Oral TID  . metoprolol tartrate  12.5 mg Oral BID   Or  . metoprolol tartrate  12.5 mg Per Tube BID  . nystatin cream   Topical TID  . pantoprazole (PROTONIX) IV  40 mg Intravenous Q24H  . sodium chloride  10-40 mL Intracatheter Q12H    have reviewed scheduled and prn medications.  Physical Exam: General: better mentally  Heart: tachy irreg Lungs: mostly clear Abdomen: soft,non tender Extremities: pitting edema    11/16/2013,7:31 AM  LOS: 11 days

## 2013-11-16 NOTE — Progress Notes (Signed)
Patient ID: Claudia [name], female   DOB: 10-10-1931, 80 y.o.   MRN: [id number] EVENING ROUNDS NOTE :     [address]       [place] [address]             [phone]                 8 Days Post-Op Procedure(s) (LRB): CORONARY ARTERY BYPASS GRAFTING (CABG), on pump, times two, using left internal mammary artery, cryo saphenous vein. (N/A) INTRAOPERATIVE TRANSESOPHAGEAL ECHOCARDIOGRAM (N/A) MITRAL VALVE (MV) REPLACEMENT (N/A)  Total Length of Stay:  LOS: 11 days  BP 116/55  Pulse 80  Temp(Src) 97.3 F (36.3 C) (Axillary)  Resp 18  Ht 5\' 7"  (1.702 m)  Wt 242 lb 11.6 oz (110.1 kg)  BMI 38.01 kg/m2  SpO2 97%  .Intake/Output     09/11 0701 - 09/12 0700 09/12 0701 - 09/13 0700   P.O. 500 240   I.V. (mL/kg) 691.7 (6.3) 450.7 (4.1)   IV Piggyback 150 132   Total Intake(mL/kg) 1341.7 (12.2) 822.7 (7.5)   Urine (mL/kg/hr) 1031 (0.4) 925 (0.9)   Total Output 1031 925   Net +310.7 -102.3        Stool Occurrence  1 x     . sodium chloride 10 mL/hr at 11/16/13 1600  . sodium chloride 10 mL (11/14/13 1817)  . amiodarone 30 mg/hr (11/16/13 1600)  . DOPamine 3.014 mcg/kg/min (11/16/13 1600)  . heparin 1,550 Units/hr (11/16/13 1600)  . milrinone 0.2 mcg/kg/min (11/16/13 1629)     Lab Results  Component Value Date   WBC 8.6 11/16/2013   HGB 9.3* 11/16/2013   HCT 26.8* 11/16/2013   PLT 162 11/16/2013   GLUCOSE 124* 11/16/2013   CHOL 125 12/03/2012   TRIG 42 12/03/2012   HDL 72 12/03/2012   LDLCALC 45 12/03/2012   ALT 62* 11/16/2013   AST 25 11/16/2013   NA 122* 11/16/2013   K 4.5 11/16/2013   CL 83* 11/16/2013   CREATININE 3.57* 11/16/2013   BUN 71* 11/16/2013   CO2 23 11/16/2013   TSH 0.596 12/02/2012   INR 1.19 11/16/2013   HGBA1C 7.2* 11/06/2013   Overall better today, holding sinus rhythm Up to chair  [place] [phone] Office [phone] 11/16/2013 4:53 PM

## 2013-11-16 NOTE — Progress Notes (Signed)
[address]       [employer] [id number]             [phone]     8 Days Post-Op  Procedure(s) (LRB):  CORONARY ARTERY BYPASS GRAFTING (CABG), on pump, times two, using left internal mammary artery, cryo saphenous vein. (N/A)  INTRAOPERATIVE TRANSESOPHAGEAL ECHOCARDIOGRAM (N/A)  MITRAL VALVE (MV) REPLACEMENT (N/A)  Total Length of Stay: LOS: 11 days  Subjective:  Feels better today, more alert  Objective:  Vital signs in last 24 hours:  Temp: [97.3 F (36.3 C)-98.3 F (36.8 C)] 97.5 F (36.4 C) (09/12 0400)  Pulse Rate: [54-122] 81 (09/12 0700)  Cardiac Rhythm: [-] Atrial fibrillation (09/12 0800)  Resp: [15-23] 17 (09/12 0700)  BP: (99-140)/(38-90) 126/38 mmHg (09/12 0700)  SpO2: [87 %-100 %] 98 % (09/12 0700)  Weight: [242 lb 11.6 oz (110.1 kg)] 242 lb 11.6 oz (110.1 kg) (09/12 0630)  Filed Weights    11/14/13 0645  11/15/13 0500  11/16/13 0630   Weight:  242 lb 8.1 oz (110 kg)  242 lb 15.2 oz (110.2 kg)  242 lb 11.6 oz (110.1 kg)    Weight change: -3.5 oz (-0.1 kg)  Hemodynamic parameters for last 24 hours:  CVP: [14 mmHg] 14 mmHg  Intake/Output from previous day:  09/11 0701 - 09/12 0700  In: 1341.7 [P.O.:500; I.V.:691.7; IV Piggyback:150]  Out: 1031 [Urine:1031]  Intake/Output this shift:  Total I/O  In: -  Out: 150 [Urine:150]  Current Meds:  Scheduled Meds:  .  sodium chloride   Intravenous  Once   .  sodium chloride   Intravenous  Once   .  sodium chloride   Intravenous  Once   .  bisacodyl  10 mg  Oral  Daily    Or   .  bisacodyl  10 mg  Rectal  Daily   .  cefTAZidime (FORTAZ) IV  1 g  Intravenous  Q24H   .  docusate  200 mg  Oral  Daily   .  feeding supplement (GLUCERNA SHAKE)  237 mL  Oral  Q1500   .  furosemide  160 mg  Intravenous  Q6H   .  insulin aspart  0-24 Units  Subcutaneous  6 times per day   .  lactulose  20 g  Oral  Daily   .  magic mouthwash  5 mL  Oral  TID   .  metolazone  5 mg  Oral  Daily   .  metoprolol  tartrate  12.5 mg  Oral  BID    Or   .  metoprolol tartrate  12.5 mg  Per Tube  BID   .  nystatin cream   Topical  TID   .  pantoprazole (PROTONIX) IV  40 mg  Intravenous  Q24H   .  sodium chloride  10-40 mL  Intracatheter  Q12H    Continuous Infusions:  .  sodium chloride  20 mL/hr at 11/12/13 1900   .  sodium chloride  10 mL (11/14/13 1817)   .  amiodarone  30 mg/hr (11/16/13 0618)   .  DOPamine  3 mcg/kg/min (11/15/13 1900)   .  heparin  1,300 Units/hr (11/16/13 0618)   .  milrinone  0.2 mcg/kg/min (11/15/13 2147)    PRN Meds:.fentaNYL, levalbuterol, metoprolol, ondansetron (ZOFRAN) IV, promethazine, sodium chloride, traMADol  General appearance: alert, cooperative, appears older than stated age and no distress  Neurologic: intact  Heart:  regular rate and rhythm, S1, S2 normal, no murmur, click, rub or gallop  Lungs: diminished breath sounds bibasilar  Abdomen: soft, non-tender; bowel sounds normal; no masses, no organomegaly  Extremities: edema marked anasarca both lower extremities  Wound: sternum stable without obvious infection  Lab Results:  CBC:  Recent Labs   11/15/13 0330  11/16/13 0515   WBC  10.8*  8.6   HGB  9.4*  9.3*   HCT  26.4*  26.8*   PLT  126*  162    BMET:  Recent Labs   11/15/13 0330  11/16/13 0515   NA  122*  122*   K  4.5  4.5   CL  83*  83*   CO2  23  23   GLUCOSE  129*  124*   BUN  65*  71*   CREATININE  3.47*  3.57*   CALCIUM  8.1*  8.2*    PT/INR:  Recent Labs   11/16/13 0515   LABPROT  15.1   INR  1.19    Radiology: Dg Chest Port 1 View  11/16/2013 CLINICAL DATA: CABG September 4th EXAM: PORTABLE CHEST - 1 VIEW COMPARISON: 11/15/2013 FINDINGS: Status post CABG with replacement cardiac valve. Mild cardiac enlargement stable. Right central line unchanged. Mild to moderate vascular congestion. Mild interstitial pulmonary edema. Multifocal discoid atelectasis in the bilateral central lung zones. IMPRESSION: No significant change from  11/15/2013 with bilateral atelectasis and mild interstitial pulmonary edema. Electronically Signed By: Skipper Cliche M.D. On: 11/16/2013 08:23  Dg Chest Port 1 View  11/15/2013 CLINICAL DATA: Post CABG EXAM: PORTABLE CHEST - 1 VIEW COMPARISON: Portable exam 0624 hr compared to 11/14/2013 FINDINGS: RIGHT subclavian central venous catheter with tip projecting over cavoatrial junction. Enlargement of cardiac silhouette post CABG and MVR. Pulmonary vascular congestion. Diffuse pulmonary edema, slightly increased. Scattered atelectasis in mid to lower lungs bilaterally. Questionable small BILATERAL pleural effusions. No pneumothorax. IMPRESSION: Slightly increased pulmonary edema. Persistent atelectasis in mid to lower lungs bilaterally. Electronically Signed By: Lavonia Dana M.D. On: 11/15/2013 07:39   Assessment/Plan:  S/P Procedure(s) (LRB):  CORONARY ARTERY BYPASS GRAFTING (CABG), on pump, times two, using left internal mammary artery, cryo saphenous vein. (N/A)  INTRAOPERATIVE TRANSESOPHAGEAL ECHOCARDIOGRAM (N/A)  MITRAL VALVE (MV) REPLACEMENT (N/A)  Mobilize  Diuresis  on heparin  Improved today with holding sinus rhythm  HIT test last week negative back on heparin for intermittent AFIB  protime now normal  Eben Choinski B  11/16/2013 9:13 AM

## 2013-11-16 NOTE — Progress Notes (Signed)
ANTICOAGULATION CONSULT NOTE - Follow Up Consult  Pharmacy Consult for heparin Indication: atrial fibrillation  Labs:  Recent Labs  11/14/13 0430 11/14/13 1500 11/15/13 0330 11/16/13 0515 11/16/13 0530  HGB 9.2*  --  9.4* 9.3*  --   HCT 26.4*  --  26.4* 26.8*  --   PLT 86*  --  126* 162  --   LABPROT  --   --   --  15.1  --   INR  --   --   --  1.19  --   HEPARINUNFRC  --   --   --   --  <0.10*  CREATININE 3.60* 3.48* 3.47*  --   --     Assessment: 81yo female undetectable on heparin with initial dosing for Afib w/ tight, low goal and conservative dosing.  Goal of Therapy:  Heparin level ~0.3 units/ml   Plan:  Will increase heparin gtt by 3 units/kg/hr to 1300 units/hr and check level in [place], PharmD, BCPS  11/16/2013,6:15 AM

## 2013-11-16 NOTE — Progress Notes (Signed)
Patient ID: Claudia [name], female   DOB: 12/24/1931, 80 y.o.   MRN: [id number] [facility] DAILY ICU PROGRESS NOTE                   [address]            [employer] [id number]          [phone]   8 Days Post-Op Procedure(s) (LRB): CORONARY ARTERY BYPASS GRAFTING (CABG), on pump, times two, using left internal mammary artery, cryo saphenous vein. (N/A) INTRAOPERATIVE TRANSESOPHAGEAL ECHOCARDIOGRAM (N/A) MITRAL VALVE (MV) REPLACEMENT (N/A)  Total Length of Stay:  LOS: 11 days   Subjective: Feels better today, more alert   Objective: Vital signs in last 24 hours: Temp:  [97.3 F (36.3 C)-98.3 F (36.8 C)] 97.5 F (36.4 C) (09/12 0400) Pulse Rate:  [54-122] 81 (09/12 0700) Cardiac Rhythm:  [-] Atrial fibrillation (09/12 0800) Resp:  [15-23] 17 (09/12 0700) BP: (99-140)/(38-90) 126/38 mmHg (09/12 0700) SpO2:  [87 %-100 %] 98 % (09/12 0700) Weight:  [242 lb 11.6 oz (110.1 kg)] 242 lb 11.6 oz (110.1 kg) (09/12 0630)  Filed Weights   11/14/13 0645 11/15/13 0500 11/16/13 0630  Weight: 242 lb 8.1 oz (110 kg) 242 lb 15.2 oz (110.2 kg) 242 lb 11.6 oz (110.1 kg)    Weight change: -3.5 oz (-0.1 kg)   Hemodynamic parameters for last 24 hours: CVP:  [14 mmHg] 14 mmHg  Intake/Output from previous day: 09/11 0701 - 09/12 0700 In: 1341.7 [P.O.:500; I.V.:691.7; IV Piggyback:150] Out: 1031 [Urine:1031]  Intake/Output this shift: Total I/O In: -  Out: 150 [Urine:150]  Current Meds: Scheduled Meds: . sodium chloride   Intravenous Once  . sodium chloride   Intravenous Once  . sodium chloride   Intravenous Once  . bisacodyl  10 mg Oral Daily   Or  . bisacodyl  10 mg Rectal Daily  . cefTAZidime (FORTAZ)  IV  1 g Intravenous Q24H  . docusate  200 mg Oral Daily  . feeding supplement (GLUCERNA SHAKE)  237 mL Oral Q1500  . furosemide  160 mg Intravenous Q6H  . insulin aspart  0-24 Units Subcutaneous 6 times per day  . lactulose  20 g Oral Daily  . magic mouthwash  5  mL Oral TID  . metolazone  5 mg Oral Daily  . metoprolol tartrate  12.5 mg Oral BID   Or  . metoprolol tartrate  12.5 mg Per Tube BID  . nystatin cream   Topical TID  . pantoprazole (PROTONIX) IV  40 mg Intravenous Q24H  . sodium chloride  10-40 mL Intracatheter Q12H   Continuous Infusions: . sodium chloride 20 mL/hr at 11/12/13 1900  . sodium chloride 10 mL (11/14/13 1817)  . amiodarone 30 mg/hr (11/16/13 0618)  . DOPamine 3 mcg/kg/min (11/15/13 1900)  . heparin 1,300 Units/hr (11/16/13 0618)  . milrinone 0.2 mcg/kg/min (11/15/13 2147)   PRN Meds:.fentaNYL, levalbuterol, metoprolol, ondansetron (ZOFRAN) IV, promethazine, sodium chloride, traMADol  General appearance: alert, cooperative, appears older than stated age and no distress Neurologic: intact Heart: regular rate and rhythm, S1, S2 normal, no murmur, click, rub or gallop Lungs: diminished breath sounds bibasilar Abdomen: soft, non-tender; bowel sounds normal; no masses,  no organomegaly Extremities: edema marked anasarca both lower extremities Wound: sternum stable without obvious infection  Lab Results: CBC: Recent Labs  11/15/13 0330 11/16/13 0515  WBC 10.8* 8.6  HGB 9.4* 9.3*  HCT 26.4* 26.8*  PLT 126* 162   BMET:  Recent Labs  11/15/13 0330 11/16/13 0515  NA 122* 122*  K 4.5 4.5  CL 83* 83*  CO2 23 23  GLUCOSE 129* 124*  BUN 65* 71*  CREATININE 3.47* 3.57*  CALCIUM 8.1* 8.2*    PT/INR:  Recent Labs  11/16/13 0515  LABPROT 15.1  INR 1.19   Radiology: Dg Chest Port 1 View  11/16/2013   CLINICAL DATA:  CABG September 4th  EXAM: PORTABLE CHEST - 1 VIEW  COMPARISON:  11/15/2013  FINDINGS: Status post CABG with replacement cardiac valve. Mild cardiac enlargement stable. Right central line unchanged.  Mild to moderate vascular congestion. Mild interstitial pulmonary edema. Multifocal discoid atelectasis in the bilateral central lung zones.  IMPRESSION: No significant change from 11/15/2013 with  bilateral atelectasis and mild interstitial pulmonary edema.   Electronically Signed   By: Skipper Cliche M.D.   On: 11/16/2013 08:23   Dg Chest Port 1 View  11/15/2013   CLINICAL DATA:  Post CABG  EXAM: PORTABLE CHEST - 1 VIEW  COMPARISON:  Portable exam 0624 hr compared to 11/14/2013  FINDINGS: RIGHT subclavian central venous catheter with tip projecting over cavoatrial junction.  Enlargement of cardiac silhouette post CABG and MVR.  Pulmonary vascular congestion.  Diffuse pulmonary edema, slightly increased.  Scattered atelectasis in mid to lower lungs bilaterally.  Questionable small BILATERAL pleural effusions.  No pneumothorax.  IMPRESSION: Slightly increased pulmonary edema.  Persistent atelectasis in mid to lower lungs bilaterally.   Electronically Signed   By: Lavonia Dana M.D.   On: 11/15/2013 07:39     Assessment/Plan: S/P Procedure(s) (LRB): CORONARY ARTERY BYPASS GRAFTING (CABG), on pump, times two, using left internal mammary artery, cryo saphenous vein. (N/A) INTRAOPERATIVE TRANSESOPHAGEAL ECHOCARDIOGRAM (N/A) MITRAL VALVE (MV) REPLACEMENT (N/A) Mobilize Diuresis on heparin  Improved today with holding sinus rhythm HIT test last week negative back on heparin for intermittent  AFIB protime now normal     Claudia [name] B 11/16/2013 9:13 AM

## 2013-11-16 NOTE — Progress Notes (Signed)
ANTICOAGULATION CONSULT NOTE - Follow Up Consult  Pharmacy Consult for Heparin Indication: atrial fibrillation  Allergies  Allergen Reactions  . Aspirin Other (See Comments)    High doses caused stomach bleeds    Patient Measurements: Height: 5\' 7"  (170.2 cm) Weight: 242 lb 11.6 oz (110.1 kg) IBW/kg (Calculated) : 61.6 Heparin Dosing Weight: 86.9 kg  Vital Signs: Temp: 97.6 F (36.4 C) (09/12 1100) Temp src: Oral (09/12 1100) BP: 116/55 mmHg (09/12 1400) Pulse Rate: 80 (09/12 1400)  Labs:  Recent Labs  11/14/13 0430 11/14/13 1500 11/15/13 0330 11/16/13 0515 11/16/13 0530 11/16/13 1415  HGB 9.2*  --  9.4* 9.3*  --   --   HCT 26.4*  --  26.4* 26.8*  --   --   PLT 86*  --  126* 162  --   --   LABPROT  --   --   --  15.1  --   --   INR  --   --   --  1.19  --   --   HEPARINUNFRC  --   --   --   --  <0.10* <0.10*  CREATININE 3.60* 3.48* 3.47* 3.57*  --   --     Estimated Creatinine Clearance: 16.3 ml/min (by C-G formula based on Cr of 3.57).   Medications:  Heparin @ 1300 units/hr  Assessment: 47 YOF s/p CABG with MVR who continues on heparin for intermittent Afib with a SUBtherapeutic heparin level this afternoon despite a rate increase this morning (HL <0.1, goal of 0.3-0.4 per Dr. Prescott Gum). Hgb/Hct stable, plts trending up, 162 << 126. No overt s/sx of bleeding noted. Will increase the drip rate and recheck a HL tonight.   Goal of Therapy:  Heparin level 0.3-0.4 units/ml Monitor platelets by anticoagulation protocol: Yes   Plan:  1. Increase heparin to 1550 units/hr (15.5 ml/hr) 2. Will continue to monitor for any signs/symptoms of bleeding and will follow up with heparin level in 8 hours   Alycia Rossetti, PharmD, BCPS Clinical Pharmacist Pager: [phone] 11/16/2013 4:08 PM

## 2013-11-17 ENCOUNTER — Inpatient Hospital Stay (HOSPITAL_COMMUNITY): Payer: Medicare HMO

## 2013-11-17 LAB — CBC
HCT: 25.9 % — ABNORMAL LOW (ref 36.0–46.0)
Hemoglobin: 9.1 g/dL — ABNORMAL LOW (ref 12.0–15.0)
MCH: 29.4 pg (ref 26.0–34.0)
MCHC: 35.1 g/dL (ref 30.0–36.0)
MCV: 83.8 fL (ref 78.0–100.0)
Platelets: 182 10*3/uL (ref 150–400)
RBC: 3.09 MIL/uL — ABNORMAL LOW (ref 3.87–5.11)
RDW: 14.9 % (ref 11.5–15.5)
WBC: 9.6 10*3/uL (ref 4.0–10.5)

## 2013-11-17 LAB — URINE CULTURE
Colony Count: NO GROWTH
Culture: NO GROWTH
Special Requests: NORMAL

## 2013-11-17 LAB — GLUCOSE, CAPILLARY
GLUCOSE-CAPILLARY: 160 mg/dL — AB (ref 70–99)
Glucose-Capillary: 155 mg/dL — ABNORMAL HIGH (ref 70–99)
Glucose-Capillary: 152 mg/dL — ABNORMAL HIGH (ref 70–99)
Glucose-Capillary: 179 mg/dL — ABNORMAL HIGH (ref 70–99)
Glucose-Capillary: 200 mg/dL — ABNORMAL HIGH (ref 70–99)
Glucose-Capillary: 117 mg/dL — ABNORMAL HIGH (ref 70–99)

## 2013-11-17 LAB — COMPREHENSIVE METABOLIC PANEL
ALT: 44 U/L — ABNORMAL HIGH (ref 0–35)
AST: 16 U/L (ref 0–37)
Albumin: 2.3 g/dL — ABNORMAL LOW (ref 3.5–5.2)
Alkaline Phosphatase: 79 U/L (ref 39–117)
Anion gap: 16 — ABNORMAL HIGH (ref 5–15)
BUN: 74 mg/dL — ABNORMAL HIGH (ref 6–23)
CO2: 24 mEq/L (ref 19–32)
Calcium: 8 mg/dL — ABNORMAL LOW (ref 8.4–10.5)
Chloride: 79 mEq/L — ABNORMAL LOW (ref 96–112)
Creatinine, Ser: 3.11 mg/dL — ABNORMAL HIGH (ref 0.50–1.10)
GFR calc Af Amer: 15 mL/min — ABNORMAL LOW (ref >90)
GFR calc non Af Amer: 13 mL/min — ABNORMAL LOW (ref >90)
Glucose, Bld: 180 mg/dL — ABNORMAL HIGH (ref 70–99)
Potassium: 3.4 mEq/L — ABNORMAL LOW (ref 3.7–5.3)
Sodium: 119 mEq/L — CL (ref 137–147)
Total Bilirubin: 0.6 mg/dL (ref 0.3–1.2)
Total Protein: 5 g/dL — ABNORMAL LOW (ref 6.0–8.3)

## 2013-11-17 LAB — HEPARIN LEVEL (UNFRACTIONATED)
Heparin Unfractionated: 0.25 IU/mL — ABNORMAL LOW (ref 0.30–0.70)
Heparin Unfractionated: 0.35 IU/mL (ref 0.30–0.70)
Heparin Unfractionated: 0.29 IU/mL — ABNORMAL LOW (ref 0.30–0.70)

## 2013-11-17 LAB — CARBOXYHEMOGLOBIN
Carboxyhemoglobin: 1.3 % (ref 0.5–1.5)
Methemoglobin: 0.8 % (ref 0.0–1.5)
O2 Saturation: 58.5 %
Total hemoglobin: 12.8 g/dL (ref 12.0–16.0)

## 2013-11-17 LAB — OSMOLALITY, URINE: OSMOLALITY UR: 270 mosm/kg — AB (ref 390–1090)

## 2013-11-17 MED ORDER — POTASSIUM CHLORIDE CRYS ER 20 MEQ PO TBCR
40.0000 meq | EXTENDED_RELEASE_TABLET | Freq: Once | ORAL | Status: AC
  Administered 2013-11-17: 40 meq via ORAL
  Filled 2013-11-17: qty 2

## 2013-11-17 NOTE — Progress Notes (Signed)
Subjective:  Patient is clearer again today- great  UOP - ended up negative for the day- creatinine down but BUN up and sodium down Objective Vital signs in last 24 hours: Filed Vitals:   11/17/13 0400 11/17/13 0500 11/17/13 0600 11/17/13 0700  BP: 120/45 132/46 122/85 107/55  Pulse: 82 81 106 111  Temp: 98.2 F (36.8 C)     TempSrc: Oral     Resp: 17 21 17 27   Height:      Weight:  104.9 kg (231 lb 4.2 oz)    SpO2: 98% 100% 96% 91%   Weight change: -5.2 kg (-11 lb 7.4 oz)  Intake/Output Summary (Last 24 hours) at 11/17/13 0803 Last data filed at 11/17/13 0700  Gross per 24 hour  Intake 1660.3 ml  Output   2575 ml  Net -914.7 ml    Assessment/Plan: 80 year old female with multiple medical issues including stage III CKD who is status post CABG and mitral valve replacement with postoperative acute on chronic renal failure  1.Renal- acute kidney injury on chronic kidney disease in the setting of above. The insult was most likely hemodynamic in nature/ATN and not unusual in this scenario. Of note, her pre-operative urinalysis looked like [name] possible urinary tract infection, but looks OK now. She does have [name] Foley catheter in place. Her urine output is much better this AM.  I think that her kidneys don't tolerate the Afib very well.  Fortunately there are no absolute indications for dialysis at this time. The fact that she did have CKD (crt 1.5) going into her operation will hamper her recovery. I did tell her that dialysis was [name] possibility but hopefully it would be short-term if was needed at all. Is stable to improved today- UOP good on high dose lasix 2. Hypertension/volume - her CVP has been good. She is on only low dose dopamine. She is likely third spacing fluid but is also very positive with hyponatremia. Back on lasix 160 q 6- and zarox 5 daily 3 Anemia - situational and CKD- have given dose of aranesp - supportive care and PRN transfusion 4. electrolytes are mostly within normal  limits - will give some K today 5. Hyponatremia- I really  think due to volume overload- is worse though with negative fluid balance ?- will check urine osm although will be altered due to diuretics- hopefully will see improve as diuresis continues- may need tolvaptan ? 6. Confusion- sodium ? ICU? Supportive care- is better today    Claudia [name]    Labs: Basic Metabolic Panel:  Recent Labs Lab 11/14/13 0430 11/14/13 1500 11/15/13 0330 11/16/13 0515 11/17/13 0418  NA 123* 123* 122* 122* 119*  K 4.5 4.9 4.5 4.5 3.4*  CL 87* 85* 83* 83* 79*  CO2 21 22 23 23 24   GLUCOSE 132* 168* 129* 124* 180*  BUN 60* 62* 65* 71* 74*  CREATININE 3.60* 3.48* 3.47* 3.57* 3.11*  CALCIUM 7.8* 8.0* 8.1* 8.2* 8.0*  PHOS  --  5.0*  --   --   --    Liver Function Tests:  Recent Labs Lab 11/15/13 0330 11/16/13 0515 11/17/13 0418  AST 30 25 16   ALT 68* 62* 44*  ALKPHOS 79 93 79  BILITOT 0.7 0.7 0.6  PROT 5.0* 5.1* 5.0*  ALBUMIN 2.3* 2.3* 2.3*   No results found for this basename: LIPASE, AMYLASE,  in the last 168 hours No results found for this basename: AMMONIA,  in the last 168 hours CBC:  Recent Labs  Lab 11/13/13 0420 11/14/13 0430 11/15/13 0330 11/16/13 0515 11/17/13 0418  WBC 8.6 9.7 10.8* 8.6 9.6  HGB 8.9* 9.2* 9.4* 9.3* 9.1*  HCT 25.4* 26.4* 26.4* 26.8* 25.9*  MCV 84.7 85.2 84.1 84.0 83.8  PLT 64* 86* 126* 162 182   Cardiac Enzymes: No results found for this basename: CKTOTAL, CKMB, CKMBINDEX, TROPONINI,  in the last 168 hours CBG:  Recent Labs Lab 11/16/13 0938 11/16/13 1203 11/16/13 1608 11/16/13 1950 11/17/13 0405  GLUCAP 119* 218* 184* 163* 155*    Iron Studies: No results found for this basename: IRON, TIBC, TRANSFERRIN, FERRITIN,  in the last 72 hours Studies/Results: Dg Chest Port 1 View  11/16/2013   CLINICAL DATA:  CABG September 4th  EXAM: PORTABLE CHEST - 1 VIEW  COMPARISON:  11/15/2013  FINDINGS: Status post CABG with replacement cardiac  valve. Mild cardiac enlargement stable. Right central line unchanged.  Mild to moderate vascular congestion. Mild interstitial pulmonary edema. Multifocal discoid atelectasis in the bilateral central lung zones.  IMPRESSION: No significant change from 11/15/2013 with bilateral atelectasis and mild interstitial pulmonary edema.   Electronically Signed   By: Skipper Cliche M.D.   On: 11/16/2013 08:23   Medications: Infusions: . sodium chloride 10 mL/hr at 11/17/13 0700  . sodium chloride 10 mL (11/14/13 1817)  . amiodarone 30 mg/hr (11/17/13 0700)  . DOPamine 3.014 mcg/kg/min (11/17/13 0700)  . heparin 1,600 Units/hr (11/17/13 4665)  . milrinone 0.2 mcg/kg/min (11/17/13 9935)    Scheduled Medications: . sodium chloride   Intravenous Once  . sodium chloride   Intravenous Once  . sodium chloride   Intravenous Once  . bisacodyl  10 mg Oral Daily   Or  . bisacodyl  10 mg Rectal Daily  . cefTAZidime (FORTAZ)  IV  1 g Intravenous Q24H  . docusate  200 mg Oral Daily  . feeding supplement (GLUCERNA SHAKE)  237 mL Oral Q1500  . furosemide  160 mg Intravenous Q6H  . insulin aspart  0-24 Units Subcutaneous 6 times per day  . lactulose  20 g Oral Daily  . magic mouthwash  5 mL Oral TID  . metolazone  5 mg Oral Daily  . metoprolol tartrate  12.5 mg Oral BID   Or  . metoprolol tartrate  12.5 mg Per Tube BID  . nystatin cream   Topical TID  . pantoprazole (PROTONIX) IV  40 mg Intravenous Q24H  . sodium chloride  10-40 mL Intracatheter Q12H    have reviewed scheduled and prn medications.  Physical Exam: General: better mentally  Heart: tachy irreg Lungs: mostly clear Abdomen: soft,non tender Extremities: pitting edema    11/17/2013,8:03 AM  LOS: 12 days

## 2013-11-17 NOTE — Progress Notes (Signed)
Patient ID: [name], female   DOB: 1931-11-25, 80 y.o.   MRN: [id number] [facility] DAILY ICU PROGRESS NOTE                   [address]            [place] [address]          [phone]   9 Days Post-Op Procedure(s) (LRB): CORONARY ARTERY BYPASS GRAFTING (CABG), on pump, times two, using left internal mammary artery, cryo saphenous vein. (N/A) INTRAOPERATIVE TRANSESOPHAGEAL ECHOCARDIOGRAM (N/A) MITRAL VALVE (MV) REPLACEMENT (N/A)  Total Length of Stay:  LOS: 12 days   Subjective: Up in chair more talkative, wants sleeping meds  Objective: Vital signs in last 24 hours: Temp:  [97.3 F (36.3 C)-98.2 F (36.8 C)] 98.2 F (36.8 C) (09/13 0400) Pulse Rate:  [80-111] 111 (09/13 0700) Cardiac Rhythm:  [-] Atrial fibrillation (09/13 0400) Resp:  [12-27] 27 (09/13 0700) BP: (102-142)/(45-91) 107/55 mmHg (09/13 0700) SpO2:  [91 %-100 %] 91 % (09/13 0700) Weight:  [231 lb 4.2 oz (104.9 kg)] 231 lb 4.2 oz (104.9 kg) (09/13 0500)  Filed Weights   11/15/13 0500 11/16/13 0630 11/17/13 0500  Weight: 242 lb 15.2 oz (110.2 kg) 242 lb 11.6 oz (110.1 kg) 231 lb 4.2 oz (104.9 kg)    Weight change: -11 lb 7.4 oz (-5.2 kg)   Hemodynamic parameters for last 24 hours:    Intake/Output from previous day: 09/12 0701 - 09/13 0700 In: 1710.1 [P.O.:240; I.V.:1206.1; IV Piggyback:264] Out: 2725 [Urine:2725]  Intake/Output this shift:    Current Meds: Scheduled Meds: . sodium chloride   Intravenous Once  . sodium chloride   Intravenous Once  . sodium chloride   Intravenous Once  . bisacodyl  10 mg Oral Daily   Or  . bisacodyl  10 mg Rectal Daily  . cefTAZidime (FORTAZ)  IV  1 g Intravenous Q24H  . docusate  200 mg Oral Daily  . feeding supplement (GLUCERNA SHAKE)  237 mL Oral Q1500  . furosemide  160 mg Intravenous Q6H  . insulin aspart  0-24 Units Subcutaneous 6 times per day  . lactulose  20 g Oral Daily  . magic mouthwash  5 mL Oral TID  . metolazone  5 mg  Oral Daily  . metoprolol tartrate  12.5 mg Oral BID   Or  . metoprolol tartrate  12.5 mg Per Tube BID  . nystatin cream   Topical TID  . pantoprazole (PROTONIX) IV  40 mg Intravenous Q24H  . sodium chloride  10-40 mL Intracatheter Q12H   Continuous Infusions: . sodium chloride 10 mL/hr at 11/17/13 0700  . sodium chloride 10 mL (11/14/13 1817)  . amiodarone 30 mg/hr (11/17/13 0700)  . DOPamine 3.014 mcg/kg/min (11/17/13 0700)  . heparin 1,600 Units/hr (11/17/13 0350)  . milrinone 0.2 mcg/kg/min (11/17/13 0607)   PRN Meds:.fentaNYL, levalbuterol, metoprolol, ondansetron (ZOFRAN) IV, promethazine, sodium chloride, traMADol  General appearance: alert and cooperative Neurologic: intact Heart: regular rate and rhythm, S1, S2 normal, no murmur, click, rub or gallop Lungs: diminished breath sounds bibasilar Abdomen: soft, non-tender; bowel sounds normal; no masses,  no organomegaly Extremities: edema severe lower extremity edema Wound: sternum stable  Lab Results: CBC: Recent Labs  11/16/13 0515 11/17/13 0418  WBC 8.6 9.6  HGB 9.3* 9.1*  HCT 26.8* 25.9*  PLT 162 182   BMET:  Recent Labs  11/16/13 0515 11/17/13 0418  NA 122* 119*  K 4.5 3.4*  CL 83* 79*  CO2 23 24  GLUCOSE 124* 180*  BUN 71* 74*  CREATININE 3.57* 3.11*  CALCIUM 8.2* 8.0*    PT/INR:  Recent Labs  11/16/13 0515  LABPROT 15.1  INR 1.19   Radiology: Dg Chest Port 1 View  11/16/2013   CLINICAL DATA:  CABG September 4th  EXAM: PORTABLE CHEST - 1 VIEW  COMPARISON:  11/15/2013  FINDINGS: Status post CABG with replacement cardiac valve. Mild cardiac enlargement stable. Right central line unchanged.  Mild to moderate vascular congestion. Mild interstitial pulmonary edema. Multifocal discoid atelectasis in the bilateral central lung zones.  IMPRESSION: No significant change from 11/15/2013 with bilateral atelectasis and mild interstitial pulmonary edema.   Electronically Signed   By: Skipper Cliche M.D.   On:  11/16/2013 08:23     Assessment/Plan: S/P Procedure(s) (LRB): CORONARY ARTERY BYPASS GRAFTING (CABG), on pump, times two, using left internal mammary artery, cryo saphenous vein. (N/A) INTRAOPERATIVE TRANSESOPHAGEAL ECHOCARDIOGRAM (N/A) MITRAL VALVE (MV) REPLACEMENT (N/A) Mobilize Diuresis Discussed with renal, persistent hyponatremia in setting of fluid overload Cr deceased slightly Back in afib this am, on heparin plts ok   Marysue Fait B 11/17/2013 8:26 AM

## 2013-11-17 NOTE — Progress Notes (Addendum)
ANTICOAGULATION CONSULT NOTE - Follow Up Consult  Pharmacy Consult for Heparin Indication: atrial fibrillation  Allergies  Allergen Reactions  . Aspirin Other (See Comments)    High doses caused stomach bleeds    Patient Measurements: Height: 5\' 7"  (170.2 cm) Weight: 231 lb 4.2 oz (104.9 kg) IBW/kg (Calculated) : 61.6 Heparin Dosing Weight: 86.9 kg  Vital Signs: Temp: 97.4 F (36.3 C) (09/13 0700) Temp src: Oral (09/13 0700) BP: 118/55 mmHg (09/13 1100) Pulse Rate: 92 (09/13 1100)  Labs:  Recent Labs  11/15/13 0330 11/16/13 0515 11/16/13 0530 11/16/13 1415 11/17/13 0047 11/17/13 0418  HGB 9.4* 9.3*  --   --   --  9.1*  HCT 26.4* 26.8*  --   --   --  25.9*  PLT 126* 162  --   --   --  182  LABPROT  --  15.1  --   --   --   --   INR  --  1.19  --   --   --   --   HEPARINUNFRC  --   --  <0.10* <0.10* 0.25*  --   CREATININE 3.47* 3.57*  --   --   --  3.11*    Estimated Creatinine Clearance: 18.3 ml/min (by C-G formula based on Cr of 3.11).   Medications:  Heparin @ 1600 units/hr  Assessment: 47 YOF s/p CABG with MVR who continues on heparin for intermittent Afib with a therapeutic heparin level this afternoon after a slight rate increase this morning (HL 0.35, goal of 0.3-0.4 per Dr. Prescott Gum). Hgb/Hct stable, plts trending up, 182 << 162. No overt s/sx of bleeding noted. Will continue at the current rate and check a repeat 8 hr HL to confirm therapeutic.  Goal of Therapy:  Heparin level 0.3-0.4 units/ml Monitor platelets by anticoagulation protocol: Yes   Plan:  1. Continue heparin at 1600 units/hr (16 ml/hr) 2. Will continue to monitor for any signs/symptoms of bleeding and will follow up with heparin level in 8 hours to confirm therapeutic  Alycia Rossetti, PharmD, BCPS Clinical Pharmacist Pager: [phone] 11/17/2013 11:40 AM    ----------------------------------------------------------------------------------------- Addendum:   The repeat level this  evening came back slightly SUBtherapeutic of desired goal range (HL 0.29 << 0.35, goal of 0.3-0.4). Will not adjust for now since so close to goal and do not want to overshoot goal range specified by Dr. Nils Pyle. Will recheck with AM labs and adjust from there.  Plan 1. Continue heparin at 1600 units/hr (16 ml/hr) 2. Will continue to monitor for any signs/symptoms of bleeding and will follow up with heparin level in the a.m.   Alycia Rossetti, PharmD, BCPS Clinical Pharmacist Pager: [phone] 11/17/2013 8:36 PM

## 2013-11-17 NOTE — Progress Notes (Signed)
ANTICOAGULATION CONSULT NOTE - Follow Up Consult  Pharmacy Consult for heparin Indication: atrial fibrillation  Labs:  Recent Labs  11/14/13 0430 11/14/13 1500 11/15/13 0330 11/16/13 0515 11/16/13 0530 11/16/13 1415 11/17/13 0047  HGB 9.2*  --  9.4* 9.3*  --   --   --   HCT 26.4*  --  26.4* 26.8*  --   --   --   PLT 86*  --  126* 162  --   --   --   LABPROT  --   --   --  15.1  --   --   --   INR  --   --   --  1.19  --   --   --   HEPARINUNFRC  --   --   --   --  <0.10* <0.10* 0.25*  CREATININE 3.60* 3.48* 3.47* 3.57*  --   --   --     Assessment: 80yo female slightly subtherapeutic on heparin after rate increase, low tight goal per Dr Prescott Gum.  Goal of Therapy:  Heparin level ~0.3 units/ml   Plan:  Will increase heparin gtt slightly to 1600 units/hr and check level in [place], PharmD, BCPS  11/17/2013,2:14 AM

## 2013-11-18 ENCOUNTER — Inpatient Hospital Stay (HOSPITAL_COMMUNITY): Payer: Medicare HMO

## 2013-11-18 LAB — GLUCOSE, CAPILLARY
Glucose-Capillary: 121 mg/dL — ABNORMAL HIGH (ref 70–99)
Glucose-Capillary: 122 mg/dL — ABNORMAL HIGH (ref 70–99)
Glucose-Capillary: 127 mg/dL — ABNORMAL HIGH (ref 70–99)
Glucose-Capillary: 178 mg/dL — ABNORMAL HIGH (ref 70–99)
Glucose-Capillary: 178 mg/dL — ABNORMAL HIGH (ref 70–99)
Glucose-Capillary: 190 mg/dL — ABNORMAL HIGH (ref 70–99)
Glucose-Capillary: 220 mg/dL — ABNORMAL HIGH (ref 70–99)

## 2013-11-18 LAB — CBC
HCT: 25.8 % — ABNORMAL LOW (ref 36.0–46.0)
Hemoglobin: 9.2 g/dL — ABNORMAL LOW (ref 12.0–15.0)
MCH: 30.1 pg (ref 26.0–34.0)
MCHC: 35.7 g/dL (ref 30.0–36.0)
MCV: 84.3 fL (ref 78.0–100.0)
Platelets: 180 10*3/uL (ref 150–400)
RBC: 3.06 MIL/uL — ABNORMAL LOW (ref 3.87–5.11)
RDW: 15.2 % (ref 11.5–15.5)
WBC: 9.9 10*3/uL (ref 4.0–10.5)

## 2013-11-18 LAB — PROTIME-INR
INR: 1.14 (ref 0.00–1.49)
Prothrombin Time: 14.6 seconds (ref 11.6–15.2)

## 2013-11-18 LAB — CARBOXYHEMOGLOBIN
Carboxyhemoglobin: 1.5 % (ref 0.5–1.5)
Methemoglobin: 0.7 % (ref 0.0–1.5)
O2 Saturation: 62.6 %
Total hemoglobin: 9 g/dL — ABNORMAL LOW (ref 12.0–16.0)

## 2013-11-18 LAB — COMPREHENSIVE METABOLIC PANEL
ALT: 35 U/L (ref 0–35)
AST: 15 U/L (ref 0–37)
Albumin: 2.4 g/dL — ABNORMAL LOW (ref 3.5–5.2)
Alkaline Phosphatase: 76 U/L (ref 39–117)
Anion gap: 17 — ABNORMAL HIGH (ref 5–15)
BUN: 75 mg/dL — ABNORMAL HIGH (ref 6–23)
CO2: 25 mEq/L (ref 19–32)
Calcium: 8.3 mg/dL — ABNORMAL LOW (ref 8.4–10.5)
Chloride: 78 mEq/L — ABNORMAL LOW (ref 96–112)
Creatinine, Ser: 3.01 mg/dL — ABNORMAL HIGH (ref 0.50–1.10)
GFR calc Af Amer: 16 mL/min — ABNORMAL LOW (ref >90)
GFR calc non Af Amer: 14 mL/min — ABNORMAL LOW (ref >90)
Glucose, Bld: 136 mg/dL — ABNORMAL HIGH (ref 70–99)
Potassium: 3.4 mEq/L — ABNORMAL LOW (ref 3.7–5.3)
Sodium: 120 mEq/L — CL (ref 137–147)
Total Bilirubin: 0.6 mg/dL (ref 0.3–1.2)
Total Protein: 5.3 g/dL — ABNORMAL LOW (ref 6.0–8.3)

## 2013-11-18 LAB — HEPARIN LEVEL (UNFRACTIONATED): Heparin Unfractionated: 0.28 IU/mL — ABNORMAL LOW (ref 0.30–0.70)

## 2013-11-18 MED ORDER — WARFARIN SODIUM 2 MG PO TABS
2.0000 mg | ORAL_TABLET | Freq: Every day | ORAL | Status: DC
  Administered 2013-11-18 – 2013-11-19 (×2): 2 mg via ORAL
  Filled 2013-11-18 (×3): qty 1

## 2013-11-18 MED ORDER — AMIODARONE HCL IN DEXTROSE 360-4.14 MG/200ML-% IV SOLN
30.0000 mg/h | INTRAVENOUS | Status: AC
  Administered 2013-11-18: 30 mg/h via INTRAVENOUS
  Filled 2013-11-18: qty 200

## 2013-11-18 MED ORDER — DOPAMINE-DEXTROSE 3.2-5 MG/ML-% IV SOLN
2.5000 ug/kg/min | INTRAVENOUS | Status: DC
  Administered 2013-11-19 – 2013-11-21 (×2): 3 ug/kg/min via INTRAVENOUS
  Administered 2013-11-26 – 2013-12-02 (×4): 2.5 ug/kg/min via INTRAVENOUS
  Filled 2013-11-18 (×9): qty 250

## 2013-11-18 MED ORDER — POTASSIUM CHLORIDE 10 MEQ/50ML IV SOLN
10.0000 meq | INTRAVENOUS | Status: AC
  Administered 2013-11-18 (×2): 10 meq via INTRAVENOUS
  Filled 2013-11-18 (×2): qty 50

## 2013-11-18 MED ORDER — WARFARIN - PHARMACIST DOSING INPATIENT: Freq: Every day | Status: DC

## 2013-11-18 MED ORDER — POTASSIUM CHLORIDE CRYS ER 20 MEQ PO TBCR
40.0000 meq | EXTENDED_RELEASE_TABLET | Freq: Every day | ORAL | Status: DC
  Administered 2013-11-18 – 2013-11-28 (×11): 40 meq via ORAL
  Filled 2013-11-18 (×12): qty 2

## 2013-11-18 NOTE — Progress Notes (Signed)
Subjective:   Working with RT to use flutter valve Awake, clear, smiling  Good UOP yesterday - 3.2 liters Net neg 1600 cc   Objective Vital signs in last 24 hours: Filed Vitals:   11/18/13 0400 11/18/13 0500 11/18/13 0600 11/18/13 0843  BP: 127/58 135/62 115/53   Pulse: 75 75 76   Temp: 97.4 F (36.3 C)   97.6 F (36.4 C)  TempSrc: Oral   Oral  Resp: 10 18 22    Height:      Weight:  103 kg (227 lb 1.2 oz)    SpO2: 92% 97% 97%    Weight change: -1.9 kg (-4 lb 3 oz)  Intake/Output Summary (Last 24 hours) at 11/18/13 1204 Last data filed at 11/18/13 1000  Gross per 24 hour  Intake 1110.8 ml  Output   2940 ml  Net -1829.2 ml   Weight Trending 11/18/13 0500 103 kg (227 lb 1.2 oz) 11/17/13 0500 104.9 kg (231 lb 4.2 oz) 11/16/13 0630 110.1 kg (242 lb 11.6 oz)   11/15/13 0500 110.2 kg (242 lb 15.2 oz)  11/14/13 0645 110 kg (242 lb 8.1 oz) 11/13/13 0500 107.8 kg (237 lb 10.5 oz)  11/12/13 0500 105.3 kg (232 lb 2.3 oz)  11/11/13 0530 101.5 kg (223 lb 12.3 oz)  11/10/13 0630 104 kg (229 lb 4.5 oz)  11/09/13 0200 98.4 kg (216 lb 14.9 oz) 11/08/13 1924 86.7 kg (191 lb 2.2 oz) 11/05/13 1330 86.7 kg (191 lb   Physical Exam: General: better mentally  Heart: tachy irreg Lungs: Ant clear, chest incision dry Abdomen: soft,non tender Extremities: pitting edema to the hips  Labs: Basic Metabolic Panel:  Recent Labs Lab 11/14/13 0430 11/14/13 1500  11/16/13 0515 11/17/13 0418 11/18/13 0430  NA 123* 123*  < > 122* 119* 120*  K 4.5 4.9  < > 4.5 3.4* 3.4*  CL 87* 85*  < > 83* 79* 78*  CO2 21 22  < > 23 24 25   GLUCOSE 132* 168*  < > 124* 180* 136*  BUN 60* 62*  < > 71* 74* 75*  CREATININE 3.60* 3.48*  < > 3.57* 3.11* 3.01*  CALCIUM 7.8* 8.0*  < > 8.2* 8.0* 8.3*  PHOS  --  5.0*  --   --   --   --   < > = values in this interval not displayed. Liver Function Tests:  Recent Labs Lab 11/16/13 0515 11/17/13 0418 11/18/13 0430  AST 25 16 15   ALT 62* 44* 35  ALKPHOS 93  79 76  BILITOT 0.7 0.6 0.6  PROT 5.1* 5.0* 5.3*  ALBUMIN 2.3* 2.3* 2.4*   No results found for this basename: LIPASE, AMYLASE,  in the last 168 hours No results found for this basename: AMMONIA,  in the last 168 hours CBC:  Recent Labs Lab 11/14/13 0430 11/15/13 0330 11/16/13 0515 11/17/13 0418 11/18/13 0430  WBC 9.7 10.8* 8.6 9.6 9.9  HGB 9.2* 9.4* 9.3* 9.1* 9.2*  HCT 26.4* 26.4* 26.8* 25.9* 25.8*  MCV 85.2 84.1 84.0 83.8 84.3  PLT 86* 126* 162 182 180   Cardiac Enzymes: No results found for this basename: CKTOTAL, CKMB, CKMBINDEX, TROPONINI,  in the last 168 hours CBG:  Recent Labs Lab 11/17/13 1659 11/17/13 1938 11/18/13 0015 11/18/13 0433 11/18/13 0841  GLUCAP 160* 117* 127* 122* 121*    Iron Studies: No results found for this basename: IRON, TIBC, TRANSFERRIN, FERRITIN,  in the last 72 hours Studies/Results: Dg Chest Standard [facility]  11/18/2013   CLINICAL DATA:  Status post valve repair  EXAM: PORTABLE CHEST - 1 VIEW  COMPARISON:  11/17/2013  FINDINGS: Right-sided central venous line is again identified and stable. The cardiac shadow is stable. Postoperative changes are again seen. The degree of lung aeration is stable. Increasing atelectatic changes are noted bilaterally particularly in the left mid lung. Small bilateral pleural effusions are noted. No bony abnormality is seen.  IMPRESSION: Increasing atelectatic changes particularly in the left mid lung. Continued followup is recommended.   Electronically Signed   By: Inez Catalina M.D.   On: 11/18/2013 08:13   Dg Chest Port 1 View  11/17/2013   CLINICAL DATA:  Status post mitral valve replacement.  EXAM: PORTABLE CHEST - 1 VIEW  COMPARISON:  Chest x-ray 11/16/2013.  FINDINGS: There is a right-sided subclavian central venous catheter with tip terminating in the superior cavoatrial junction. Lung volumes are low. There are persistent bibasilar opacities (left greater than right) which are predominantly favored to reflect  areas of resolving postoperative subsegmental atelectasis. Small bilateral pleural effusions (left greater than right). No evidence of pulmonary edema. Heart size is borderline enlarged. Upper mediastinal contours are distorted by patient's rotation to the left. Atherosclerosis in the thoracic aorta. Status post median sternotomy for CABG and mitral valve replacement (a bioprosthetic mitral valve is noted).  IMPRESSION: 1. Support apparatus and postoperative changes, as above. 2. Lung volumes slightly decreased, with worsening bibasilar aeration, favored to reflect postoperative subsegmental atelectasis and superimposed small bilateral pleural effusions (left greater than right).   Electronically Signed   By: Vinnie Langton M.D.   On: 11/17/2013 09:21   Medications: Infusions: . sodium chloride 10 mL/hr at 11/18/13 0700  . sodium chloride 10 mL (11/14/13 1817)  . DOPamine 3.014 mcg/kg/min (11/18/13 0700)  . heparin 1,600 Units/hr (11/18/13 0600)  . milrinone 0.2 mcg/kg/min (11/18/13 0600)    Scheduled Medications: . sodium chloride   Intravenous Once  . sodium chloride   Intravenous Once  . sodium chloride   Intravenous Once  . bisacodyl  10 mg Oral Daily   Or  . bisacodyl  10 mg Rectal Daily  . cefTAZidime (FORTAZ)  IV  1 g Intravenous Q24H  . docusate  200 mg Oral Daily  . feeding supplement (GLUCERNA SHAKE)  237 mL Oral Q1500  . furosemide  160 mg Intravenous Q6H  . insulin aspart  0-24 Units Subcutaneous 6 times per day  . lactulose  20 g Oral Daily  . magic mouthwash  5 mL Oral TID  . metolazone  5 mg Oral Daily  . metoprolol tartrate  12.5 mg Oral BID   Or  . metoprolol tartrate  12.5 mg Per Tube BID  . nystatin cream   Topical TID  . pantoprazole (PROTONIX) IV  40 mg Intravenous Q24H  . potassium chloride  40 mEq Oral Daily  . sodium chloride  10-40 mL Intracatheter Q12H    Background 80 year old female with multiple medical issues including stage III CKD who is status  post CABG and mitral valve replacement with postoperative acute on chronic renal failure (baseline creatinine around 1.5), marked volume overload  Assessment/Recommendations  Renal- acute kidney injury on chronic kidney disease in the setting of above. The insult was most likely hemodynamic in nature/ATN and not unusual in this scenario. Of note, her pre-operative urinalysis looked like a possible urinary tract infection, but looks OK now. She does have a foley catheter in place. UOP was good yesterday on lasix and  zaroxolyn.  Still very volume overloaded with anasarca.  Fortunately has had no absolute indications for dialysis. The fact that she did have CKD (crt 1.5) going into her operation will hamper her recovery. She is aware that dialysis is a possibility but hopefully it would be short-term if was needed at all. Is stable to improved today- UOP good on high dose lasix+metolazone. Continue current dosing. If she could stay in sinus rhythm would be favorable to renal perfusion Hypertension/volume - Last CVP was 9 on 9/13.  She is on low dose dopamine. She is likely third spacing fluid but is also very positive with hyponatremia. Back on lasix 160 q 6- IV and zarox 5 daily Anemia - situational and CKD- have given dose of aranesp - supportive care and PRN transfusion Electrolytes are mostly within normal limits - now on 40 mEq K daily Hyponatremia- I really  think due to volume overload- is worse though with negative fluid balance ?. UOsm 270 not as dilute as would expect with high dose diuretics.  Sodium sl better today and anticipate if can maintain current rate of diuresis will slowly trend back up.  Confusion- sodium ? ICU? Seems appropriate today. S/p CABG/MVR AFib on heparin, BB  Jamal Maes, MD [facility] [phone] Pager 11/18/2013, 12:14 PM

## 2013-11-18 NOTE — Progress Notes (Signed)
Physical Therapy Treatment Patient Details Name: Claudia [name] MRN: [id number] DOB: 1932-01-08 Today's Date: 11/18/2013    History of Present Illness Pt adm with chest pain and underwent urgent CABG x 2 and MVR on 11/09/13. PMH - HTN, MI, DM    PT Comments    Pt continues to have very limited progress with mobility. Feel she will need extended rehab time that SNF can provide.  Follow Up Recommendations  SNF     Equipment Recommendations  None recommended by PT    Recommendations for Other Services       Precautions / Restrictions Precautions Precautions: Fall;Sternal    Mobility  Bed Mobility Overal bed mobility: Needs Assistance Bed Mobility: Sit to Sidelying         Sit to sidelying: +2 for physical assistance;Mod assist General bed mobility comments: Assist to control descent of trunk and bring legs up into bed.  Transfers Overall transfer level: Needs assistance Equipment used: Ambulation equipment used;Rolling walker (2 wheeled) (Stedy) Transfers: Sit to/from [facility] Sit to Stand: +2 physical assistance;Mod assist Stand pivot transfers: +2 physical assistance;Mod assist       General transfer comment: Verbal cues for sternal precautions and assist to bring hips up. Used Stedy to perform pivot from chair to bed.  Ambulation/Gait Ambulation/Gait assistance: +2 physical assistance;Min assist Ambulation Distance (Feet): 6 Feet Assistive device: Rolling walker (2 wheeled) Gait Pattern/deviations: Step-through pattern;Decreased step length - right;Decreased step length - left;Shuffle;Trunk flexed Gait velocity: very slow Gait velocity interpretation: Below normal speed for age/gender General Gait Details: Verbal cues for pt to pick her feet up and incr step length. Cues to stand more erect. Dyspnea 3/4 on 1L.   Stairs            Wheelchair Mobility    Modified Rankin (Stroke Patients Only)       Balance   Sitting-balance  support: No upper extremity supported;Feet supported Sitting balance-Leahy Scale: Fair     Standing balance support: Bilateral upper extremity supported Standing balance-Leahy Scale: Poor Standing balance comment: Support of walker and min A for static standing.                    Cognition Arousal/Alertness: Awake/alert Behavior During Therapy: WFL for tasks assessed/performed Overall Cognitive Status: Within Functional Limits for tasks assessed                      Exercises      General Comments        Pertinent Vitals/Pain Pain Assessment: No/denies pain    Home Living                      Prior Function            PT Goals (current goals can now be found in the care plan section) Progress towards PT goals: Not progressing toward goals - comment (continues to have fatigue.)    Frequency  Min 3X/week    PT Plan Current plan remains appropriate    Co-evaluation             End of Session Equipment Utilized During Treatment: Oxygen Activity Tolerance: Patient limited by fatigue Patient left: in bed;with call bell/phone within reach;with nursing/sitter in room     Time: 2774-1287 PT Time Calculation (min): 28 min  Charges:  $Gait Training: 23-37 mins  G Codes:      Emree Locicero 11/18/2013, 10:45 AM  Suanne Marker PT [phone]

## 2013-11-18 NOTE — Progress Notes (Signed)
Resting comfortably  BP 97/57  Pulse 56  Temp(Src) 96.6 F (35.9 C) (Axillary)  Resp 25  Ht 5\' 7"  (1.702 m)  Wt 227 lb 1.2 oz (103 kg)  BMI 35.56 kg/m2  SpO2 95%   Intake/Output Summary (Last 24 hours) at 11/18/13 1950 Last data filed at 11/18/13 1600  Gross per 24 hour  Intake  873.2 ml  Output   3225 ml  Net -2351.8 ml   On Milrinone and dopamine

## 2013-11-18 NOTE — Progress Notes (Addendum)
      [address]       [place] [address]             [phone]      10 Days Post-Op Procedure(s) (LRB): CORONARY ARTERY BYPASS GRAFTING (CABG), on pump, times two, using left internal mammary artery, cryo saphenous vein. (N/A) INTRAOPERATIVE TRANSESOPHAGEAL ECHOCARDIOGRAM (N/A) MITRAL VALVE (MV) REPLACEMENT (N/A)  Subjective:  States she is doing okay.  She states she also feels a little better.    Objective: Vital signs in last 24 hours: Temp:  [96.8 F (36 C)-97.6 F (36.4 C)] 97.4 F (36.3 C) (09/14 0400) Pulse Rate:  [25-107] 76 (09/14 0600) Cardiac Rhythm:  [-] Atrial fibrillation (09/14 0000) Resp:  [10-26] 22 (09/14 0600) BP: (98-136)/(43-96) 115/53 mmHg (09/14 0600) SpO2:  [92 %-100 %] 97 % (09/14 0600) Weight:  [227 lb 1.2 oz (103 kg)] 227 lb 1.2 oz (103 kg) (09/14 0500)  Hemodynamic parameters for last 24 hours: CVP:  [9 mmHg] 9 mmHg  Intake/Output from previous day: 09/13 0701 - 09/14 0700 In: 1641.6 [I.V.:1343.6; IV Piggyback:298] Out: 3250 [Urine:3250]  General appearance: alert, cooperative and no distress Heart: regular rate and rhythm Lungs: diminished breath sounds bibasilar Abdomen: soft, non-tender; bowel sounds normal; no masses,  no organomegaly Extremities: edema 3-4+ Wound: clean and dry  Lab Results:  Recent Labs  11/17/13 0418 11/18/13 0430  WBC 9.6 9.9  HGB 9.1* 9.2*  HCT 25.9* 25.8*  PLT 182 180   BMET:  Recent Labs  11/17/13 0418 11/18/13 0430  NA 119* 120*  K 3.4* 3.4*  CL 79* 78*  CO2 24 25  GLUCOSE 180* 136*  BUN 74* 75*  CREATININE 3.11* 3.01*  CALCIUM 8.0* 8.3*    PT/INR:  Recent Labs  11/18/13 0430  LABPROT 14.6  INR 1.14   ABG    Component Value Date/Time   PHART 7.318* 11/11/2013 1642   HCO3 18.5* 11/11/2013 1642   TCO2 20 11/11/2013 1642   ACIDBASEDEF 7.0* 11/11/2013 1642   O2SAT 62.6 11/18/2013 0455   CBG (last 3)   Recent Labs  11/17/13 1938 11/18/13 0015 11/18/13 0433    GLUCAP 117* 127* 122*    Assessment/Plan: S/P Procedure(s) (LRB): CORONARY ARTERY BYPASS GRAFTING (CABG), on pump, times two, using left internal mammary artery, cryo saphenous vein. (N/A) INTRAOPERATIVE TRANSESOPHAGEAL ECHOCARDIOGRAM (N/A) MITRAL VALVE (MV) REPLACEMENT (N/A)  1. CV- A. Fib yesterday, currently NSR- transition to oral Amiodarone, remains on Dopamine, Milrinone wean as tolerated, continue Lopressor 2. Pulm- worsening atelectasis, effusion on CXR, wean oxygen as tolerated, will add flutter valve 3. Renal- creatinine improving, remains volume overloaded, hyponatremic- on Lasix, Metalazone, Nephrology following 4. DM- sugars controlled, continue current regimen 5. Deconditioning- needs CIR at discharge 6. Dispo- continue current care, wean drips as tolerated, work on hypervolemia   LOS: 13 days    BARRETT, [place] 11/18/2013  Continue iv amio to maintain nsr Start coumadin for MVR and postop a-fibappreciate renal input for ATN Wean off mil then dopamine when creat back to baseline  patient examined and medical record reviewed,agree with above note. VAN TRIGT III,Jaidence Geisler 11/18/2013

## 2013-11-18 NOTE — Progress Notes (Addendum)
ANTICOAGULATION CONSULT NOTE - Follow Up Consult  Pharmacy Consult for Heparin/coumadin Indication: atrial fibrillation  Allergies  Allergen Reactions  . Aspirin Other (See Comments)    High doses caused stomach bleeds    Patient Measurements: Height: 5\' 7"  (170.2 cm) Weight: 227 lb 1.2 oz (103 kg) IBW/kg (Calculated) : 61.6 Heparin Dosing Weight: 86.9 kg  Vital Signs: Temp: 97.6 F (36.4 C) (09/14 0843) Temp src: Oral (09/14 0843) BP: 115/53 mmHg (09/14 0600) Pulse Rate: 76 (09/14 0600)  Labs:  Recent Labs  11/16/13 0515  11/17/13 0418 11/17/13 1205 11/17/13 1955 11/18/13 0430  HGB 9.3*  --  9.1*  --   --  9.2*  HCT 26.8*  --  25.9*  --   --  25.8*  PLT 162  --  182  --   --  180  LABPROT 15.1  --   --   --   --  14.6  INR 1.19  --   --   --   --  1.14  HEPARINUNFRC  --   < >  --  0.35 0.29* 0.28*  CREATININE 3.57*  --  3.11*  --   --  3.01*  < > = values in this interval not displayed.  Estimated Creatinine Clearance: 18.7 ml/min (by C-G formula based on Cr of 3.01).   Medications:  Heparin @ 1600 units/hr  Assessment: 89 YOF s/p CABG with MVR who continues on heparin for intermittent Afib with a heparin level that is at goal (HL 0.28, goal of 0.3-0.4 per Dr. Prescott Gum). Hgb/Hct stable, plts stable at 180. No overt s/sx of bleeding noted. Given her narrow therapeutic range I will not adjust her rate at this time.  Goal of Therapy:  Heparin level 0.3-0.4 units/ml Monitor platelets by anticoagulation protocol: Yes   Plan:  1. Continue heparin at 1600 units/hr (16 ml/hr) 2. Will continue to monitor for any signs/symptoms of bleeding 3. Daily Heparin level and CBC  Legrand Como, Pharm.D., BCPS, AAHIVP Clinical Pharmacist Phone: [phone] or [phone] 11/18/2013, 9:03 AM  Addendum  Coumadin has been ordered and MD has put in the dose. Goat is 2-2.5 and heparin to be stopped when INR is 2.   Plan  Cont coumadin 2mg  qday Daily INR  Onnie Boer,  PharmD Pager: [phone] 11/18/2013 6:20 PM

## 2013-11-19 ENCOUNTER — Encounter (HOSPITAL_COMMUNITY): Payer: Self-pay | Admitting: Cardiothoracic Surgery

## 2013-11-19 ENCOUNTER — Inpatient Hospital Stay (HOSPITAL_COMMUNITY): Payer: Medicare HMO

## 2013-11-19 LAB — CBC
HCT: 26.2 % — ABNORMAL LOW (ref 36.0–46.0)
Hemoglobin: 9.2 g/dL — ABNORMAL LOW (ref 12.0–15.0)
MCH: 29.5 pg (ref 26.0–34.0)
MCHC: 35.1 g/dL (ref 30.0–36.0)
MCV: 84 fL (ref 78.0–100.0)
Platelets: 189 10*3/uL (ref 150–400)
RBC: 3.12 MIL/uL — ABNORMAL LOW (ref 3.87–5.11)
RDW: 15.5 % (ref 11.5–15.5)
WBC: 10.5 10*3/uL (ref 4.0–10.5)

## 2013-11-19 LAB — COMPREHENSIVE METABOLIC PANEL
ALT: 28 U/L (ref 0–35)
AST: 27 U/L (ref 0–37)
Albumin: 2.4 g/dL — ABNORMAL LOW (ref 3.5–5.2)
Alkaline Phosphatase: 88 U/L (ref 39–117)
Anion gap: 15 (ref 5–15)
BUN: 76 mg/dL — ABNORMAL HIGH (ref 6–23)
CO2: 27 mEq/L (ref 19–32)
Calcium: 8.2 mg/dL — ABNORMAL LOW (ref 8.4–10.5)
Chloride: 79 mEq/L — ABNORMAL LOW (ref 96–112)
Creatinine, Ser: 3 mg/dL — ABNORMAL HIGH (ref 0.50–1.10)
GFR calc Af Amer: 16 mL/min — ABNORMAL LOW (ref >90)
GFR calc non Af Amer: 14 mL/min — ABNORMAL LOW (ref >90)
Glucose, Bld: 152 mg/dL — ABNORMAL HIGH (ref 70–99)
Potassium: 4 mEq/L (ref 3.7–5.3)
Sodium: 121 mEq/L — CL (ref 137–147)
Total Bilirubin: 0.6 mg/dL (ref 0.3–1.2)
Total Protein: 5.6 g/dL — ABNORMAL LOW (ref 6.0–8.3)

## 2013-11-19 LAB — PROTIME-INR
INR: 1.2 (ref 0.00–1.49)
Prothrombin Time: 15.2 seconds (ref 11.6–15.2)

## 2013-11-19 LAB — GLUCOSE, CAPILLARY
GLUCOSE-CAPILLARY: 125 mg/dL — AB (ref 70–99)
GLUCOSE-CAPILLARY: 138 mg/dL — AB (ref 70–99)
GLUCOSE-CAPILLARY: 169 mg/dL — AB (ref 70–99)
Glucose-Capillary: 139 mg/dL — ABNORMAL HIGH (ref 70–99)
Glucose-Capillary: 174 mg/dL — ABNORMAL HIGH (ref 70–99)
Glucose-Capillary: 144 mg/dL — ABNORMAL HIGH (ref 70–99)

## 2013-11-19 LAB — HEPARIN LEVEL (UNFRACTIONATED): Heparin Unfractionated: 0.31 IU/mL (ref 0.30–0.70)

## 2013-11-19 LAB — CARBOXYHEMOGLOBIN
Carboxyhemoglobin: 1.7 % — ABNORMAL HIGH (ref 0.5–1.5)
Methemoglobin: 0.5 % (ref 0.0–1.5)
O2 Saturation: 63.1 %
Total hemoglobin: 9.2 g/dL — ABNORMAL LOW (ref 12.0–16.0)

## 2013-11-19 MED ORDER — AMIODARONE HCL IN DEXTROSE 360-4.14 MG/200ML-% IV SOLN
30.0000 mg/h | INTRAVENOUS | Status: AC
  Administered 2013-11-19: 30 mg/h via INTRAVENOUS

## 2013-11-19 MED ORDER — AMIODARONE IV BOLUS ONLY 150 MG/100ML
150.0000 mg | Freq: Once | INTRAVENOUS | Status: AC
  Administered 2013-11-19: 150 mg via INTRAVENOUS

## 2013-11-19 MED ORDER — ACETYLCYSTEINE 20 % IN SOLN
2.0000 mL | Freq: Three times a day (TID) | RESPIRATORY_TRACT | Status: DC
  Administered 2013-11-19: 11:00:00 via RESPIRATORY_TRACT
  Filled 2013-11-19 (×3): qty 4

## 2013-11-19 MED ORDER — METOCLOPRAMIDE HCL 5 MG/ML IJ SOLN
10.0000 mg | Freq: Four times a day (QID) | INTRAMUSCULAR | Status: AC
  Administered 2013-11-19 – 2013-11-20 (×4): 10 mg via INTRAVENOUS
  Filled 2013-11-19 (×4): qty 2

## 2013-11-19 MED ORDER — LEVALBUTEROL HCL 0.63 MG/3ML IN NEBU
INHALATION_SOLUTION | RESPIRATORY_TRACT | Status: AC
Start: 2013-11-19 — End: 2013-11-19
  Administered 2013-11-19: 0.63 mg
  Filled 2013-11-19: qty 3

## 2013-11-19 MED ORDER — ACETYLCYSTEINE 20 % IN SOLN: 2.0000 mL | Freq: Three times a day (TID) | RESPIRATORY_TRACT | Status: DC

## 2013-11-19 MED ORDER — COUMADIN BOOK
Freq: Once | Status: AC
  Administered 2013-11-19: 09:00:00
  Filled 2013-11-19: qty 1

## 2013-11-19 MED ORDER — ACETYLCYSTEINE 20 % IN SOLN
3.0000 mL | Freq: Three times a day (TID) | RESPIRATORY_TRACT | Status: DC
  Administered 2013-11-19: 2 mL via RESPIRATORY_TRACT
  Administered 2013-11-19 – 2013-11-20 (×3): 3 mL via RESPIRATORY_TRACT
  Administered 2013-11-20: 15:00:00 via RESPIRATORY_TRACT
  Administered 2013-11-21 – 2013-11-22 (×5): 3 mL via RESPIRATORY_TRACT
  Filled 2013-11-19 (×15): qty 4

## 2013-11-19 MED ORDER — WARFARIN VIDEO
Freq: Once | Status: AC
  Administered 2013-11-19: 12:00:00

## 2013-11-19 MED ORDER — AMIODARONE HCL IN DEXTROSE 360-4.14 MG/200ML-% IV SOLN
30.0000 mg/h | INTRAVENOUS | Status: DC
  Administered 2013-11-19 – 2013-11-20 (×3): 30 mg/h via INTRAVENOUS
  Filled 2013-11-19 (×9): qty 200

## 2013-11-19 MED ORDER — LEVALBUTEROL HCL 0.63 MG/3ML IN NEBU
0.6300 mg | INHALATION_SOLUTION | Freq: Three times a day (TID) | RESPIRATORY_TRACT | Status: DC
  Administered 2013-11-19 – 2013-11-22 (×10): 0.63 mg via RESPIRATORY_TRACT
  Filled 2013-11-19 (×21): qty 3

## 2013-11-19 MED ORDER — ACETYLCYSTEINE 10 % IN SOLN
2.0000 mL | Freq: Three times a day (TID) | RESPIRATORY_TRACT | Status: DC
  Filled 2013-11-19 (×3): qty 4

## 2013-11-19 MED ORDER — LEVALBUTEROL HCL 0.63 MG/3ML IN NEBU
INHALATION_SOLUTION | RESPIRATORY_TRACT | Status: AC
  Filled 2013-11-19: qty 3

## 2013-11-19 MED ORDER — AMIODARONE HCL IN DEXTROSE 360-4.14 MG/200ML-% IV SOLN
30.0000 mg/h | INTRAVENOUS | Status: AC
  Filled 2013-11-19: qty 200

## 2013-11-19 NOTE — Progress Notes (Signed)
ANTICOAGULATION CONSULT NOTE - Follow Up Consult  Pharmacy Consult for Heparin/coumadin Indication: atrial fibrillation  Allergies  Allergen Reactions  . Aspirin Other (See Comments)    High doses caused stomach bleeds    Patient Measurements: Height: 5\' 7"  (170.2 cm) Weight: 234 lb 9.1 oz (106.4 kg) IBW/kg (Calculated) : 61.6 Heparin Dosing Weight: 86.9 kg  Vital Signs: Temp: 97 F (36.1 C) (09/15 0808) Temp src: Axillary (09/15 0808) BP: 115/56 mmHg (09/15 0700) Pulse Rate: 84 (09/15 0700)  Labs:  Recent Labs  11/17/13 0418  11/17/13 1955 11/18/13 0430 11/19/13 0420 11/19/13 0600  HGB 9.1*  --   --  9.2* 9.2*  --   HCT 25.9*  --   --  25.8* 26.2*  --   PLT 182  --   --  180 189  --   LABPROT  --   --   --  14.6  --  15.2  INR  --   --   --  1.14  --  1.20  HEPARINUNFRC  --   < > 0.29* 0.28*  --  0.31  CREATININE 3.11*  --   --  3.01* 3.00*  --   < > = values in this interval not displayed.  Estimated Creatinine Clearance: 19.1 ml/min (by C-G formula based on Cr of 3).   Medications:  Heparin @ 1600 units/hr  Assessment: 62 YOF s/p CABG with MVR who continues on heparin bridge to coumadin for intermittent Afib. Heparin level therapeutic (0.31). INR 1.2 past one dose of coumadin yesterday. MD ordered 2mg  daily - likely that pt will need a higher dose. Pt continues on amiodarone which will potentiate effects of coumadin. Hgb/Hct stable, plts stable at 189. No overt s/sx of bleeding noted.   Goal of Therapy:  Heparin level 0.3-0.4 units/ml (per Dr. Prescott Gum); INR 2-2.5 Monitor platelets by anticoagulation protocol: Yes   Plan:  1. Continue heparin at 1600 units/hr (16 ml/hr). D/c heparin when INR >/=2. 2. Will continue to monitor for any signs/symptoms of bleeding 3. Daily Heparin level, CBC, INR 4. Coumadin 2mg  daily (per MD order) 5. Coumadin educational book and video  Sherlon Handing, PharmD, BCPS Clinical pharmacist, pager [phone] 11/19/2013, 8:51  AM

## 2013-11-19 NOTE — Progress Notes (Signed)
NUTRITION FOLLOW UP  Intervention: Continue Glucerna Shake po daily, each supplement provides 350 kcal and 13 grams of protein RD to follow for nutrition care plan  Nutrition Dx: Increased nutrient needs related to post-op healing as evidenced by estimated nutrition needs, ongoing  Goal: Pt to meet >/= 90% of their estimated nutrition needs, progressing  ASSESSMENT: 80 year old female with extensive cardiac history, admitted on 9/1 with chest pain.   Patient s/p procedures 9/5: CORONARY ARTERY BYPASS GRAFTING x 2 MITRAL VALVE REPLACEMENT   Patient extubated 9/6.  Diet advanced to Carbohydrate Modified 9/10, Renal/Carbohydrate Modified 1200 ml fluid restriction 9/14.  PO intake variable at 25-80% per flowsheet records.  Noted had episode of nausea.  Drinking some of her Glucerna Shake supplements.  Nephrology following for AKI on CKD.  Height: Ht Readings from Last 1 Encounters:  11/05/13 5\' 7"  (1.702 m)    Weight: Wt Readings from Last 1 Encounters:  11/19/13 234 lb 9.1 oz (106.4 kg)    BMI:  Body mass index is 36.73 kg/(m^2).    Estimated Nutritional Needs: Kcal: 1700-1900 Protein: 90-100 gm  Fluid: 1.7-1.9 L  Skin: surgical incisions to legs and chest  Diet Order: Diabetic   Intake/Output Summary (Last 24 hours) at 11/19/13 1156 Last data filed at 11/19/13 1100  Gross per 24 hour  Intake 2024.22 ml  Output   2570 ml  Net -545.78 ml    Labs:   Recent Labs Lab 11/14/13 0430 11/14/13 1500  11/17/13 0418 11/18/13 0430 11/19/13 0420  NA 123* 123*  < > 119* 120* 121*  K 4.5 4.9  < > 3.4* 3.4* 4.0  CL 87* 85*  < > 79* 78* 79*  CO2 21 22  < > 24 25 27   BUN 60* 62*  < > 74* 75* 76*  CREATININE 3.60* 3.48*  < > 3.11* 3.01* 3.00*  CALCIUM 7.8* 8.0*  < > 8.0* 8.3* 8.2*  PHOS  --  5.0*  --   --   --   --   GLUCOSE 132* 168*  < > 180* 136* 152*  < > = values in this interval not displayed.  CBG (last 3)   Recent Labs  11/18/13 2328 11/19/13 0338  11/19/13 0804  GLUCAP 178* 139* 125*    Scheduled Meds: . sodium chloride   Intravenous Once  . sodium chloride   Intravenous Once  . sodium chloride   Intravenous Once  . acetylcysteine  2 mL Nebulization TID  . bisacodyl  10 mg Oral Daily   Or  . bisacodyl  10 mg Rectal Daily  . cefTAZidime (FORTAZ)  IV  1 g Intravenous Q24H  . docusate  200 mg Oral Daily  . feeding supplement (GLUCERNA SHAKE)  237 mL Oral Q1500  . furosemide  160 mg Intravenous Q6H  . insulin aspart  0-24 Units Subcutaneous 6 times per day  . lactulose  20 g Oral Daily  . magic mouthwash  5 mL Oral TID  . metoCLOPramide (REGLAN) injection  10 mg Intravenous 4 times per day  . metolazone  5 mg Oral Daily  . metoprolol tartrate  12.5 mg Oral BID   Or  . metoprolol tartrate  12.5 mg Per Tube BID  . nystatin cream   Topical TID  . pantoprazole (PROTONIX) IV  40 mg Intravenous Q24H  . potassium chloride  40 mEq Oral Daily  . sodium chloride  10-40 mL Intracatheter Q12H  . warfarin  2 mg Oral Daily  .  warfarin   Does not apply Once  . Warfarin - Pharmacist Dosing Inpatient   Does not apply q1800    Continuous Infusions: . sodium chloride 10 mL/hr at 11/19/13 0800  . sodium chloride 10 mL (11/14/13 1817)  . amiodarone 30 mg/hr (11/19/13 1100)  . DOPamine 3 mcg/kg/min (11/19/13 1100)  . heparin 1,600 Units/hr (11/19/13 3893)    Past Medical History  Diagnosis Date  . GERD (gastroesophageal reflux disease)   . PUD (peptic ulcer disease)   . Anemia   . Hypertension   . S/P endoscopy Aug 2011    3 superficial gastric ulcers, NSAID-induced  . S/P colonoscopy Sept 2011    left-sided diverticula, tubular adenoma  . Coronary artery disease   . Shortness of breath   . Diabetes mellitus     insulin dependent  . Headache(784.0)     rare migraines  . Cancer     hx of skin cancer  . Arthritis   . Osteopenia   . Hypercholesterolemia   . SIADH (syndrome of inappropriate ADH production)   . CHF (congestive  heart failure)   . Myocardial infarction 2013    Past Surgical History  Procedure Laterality Date  . Tonsillectomy    . Appendectomy    . Eye surgery      cataracts  . Cardiac stents  07/19/2011  . Esophagogastroduodenoscopy  10/16/09    normal without barrett's/three superficial gastric ulcers  . Colonoscopy  12/04/09    normal rectum/left-sided diverticula  . Joint replacement  arthroscopy to knee  . Toe amputation Left 2013    left second toe amputation  . Cardiac catheterization  01/22/2013  . Coronary angioplasty  07/2011  . Coronary artery bypass graft N/A 11/08/2013    Procedure: CORONARY ARTERY BYPASS GRAFTING (CABG), on pump, times two, using left internal mammary artery, cryo saphenous vein.;  Surgeon: Ivin Poot, MD;  Location: [facility];  Service: Open Heart Surgery;  Laterality: N/A;  LIMA-LAD CRYOVEIN -OM  . Intraoperative transesophageal echocardiogram N/A 11/08/2013    Procedure: INTRAOPERATIVE TRANSESOPHAGEAL ECHOCARDIOGRAM;  Surgeon: Ivin Poot, MD;  Location: [facility];  Service: Open Heart Surgery;  Laterality: N/A;  . Mitral valve replacement N/A 11/08/2013    Procedure: MITRAL VALVE (MV) REPLACEMENT;  Surgeon: Ivin Poot, MD;  Location: [facility];  Service: Open Heart Surgery;  Laterality: N/A;  #25 MAGNA MITRAL EASE    Arthur Holms, RD, LDN Pager #: [phone] After-Hours Pager #: [phone]

## 2013-11-19 NOTE — Progress Notes (Signed)
Patient ID: Claudia [name], female   DOB: 1931-10-17, 80 y.o.   MRN: [id number]  Cardiothoracic Surgery:  Hemodynamically stable. Milrinone off. On dop 3  Remains in rate-controlled A-fib/flutter on IV amio  Good urine output  Feels better this afternoon.

## 2013-11-19 NOTE — Progress Notes (Signed)
      Claudia [name]       [place] [address]             [phone]      11 Days Post-Op Procedure(s) (LRB): CORONARY ARTERY BYPASS GRAFTING (CABG), on pump, times two, using left internal mammary artery, cryo saphenous vein. (N/A) INTRAOPERATIVE TRANSESOPHAGEAL ECHOCARDIOGRAM (N/A) MITRAL VALVE (MV) REPLACEMENT (N/A)  Subjective:  Claudia [name] doesn't feel that great this morning.  She has no specific complaints.  + Ambulation, + BM  Objective: Vital signs in last 24 hours: Temp:  [96.6 F (35.9 C)-97.8 F (36.6 C)] 97 F (36.1 C) (09/15 0808) Pulse Rate:  [56-113] 84 (09/15 0700) Cardiac Rhythm:  [-] Atrial fibrillation (09/15 0400) Resp:  [17-25] 24 (09/15 0700) BP: (85-125)/(42-77) 115/56 mmHg (09/15 0700) SpO2:  [90 %-100 %] 94 % (09/15 0700) Weight:  [234 lb 9.1 oz (106.4 kg)] 234 lb 9.1 oz (106.4 kg) (09/15 0500)  Intake/Output from previous day: 09/14 0701 - 09/15 0700 In: 2224.7 [P.O.:950; I.V.:1026.7; IV Piggyback:248] Out: 2685 [Urine:2685]  General appearance: alert, cooperative and no distress Heart: irregularly irregular rhythm Lungs: diminished breath sounds bibasilar Abdomen: soft, non-tender; bowel sounds normal; no masses,  no organomegaly Extremities: edema 2-3+ Wound: clean and dry  Lab Results:  Recent Labs  11/18/13 0430 11/19/13 0420  WBC 9.9 10.5  HGB 9.2* 9.2*  HCT 25.8* 26.2*  PLT 180 189   BMET:  Recent Labs  11/18/13 0430 11/19/13 0420  NA 120* 121*  K 3.4* 4.0  CL 78* 79*  CO2 25 27  GLUCOSE 136* 152*  BUN 75* 76*  CREATININE 3.01* 3.00*  CALCIUM 8.3* 8.2*    PT/INR:  Recent Labs  11/19/13 0600  LABPROT 15.2  INR 1.20   ABG    Component Value Date/Time   PHART 7.318* 11/11/2013 1642   HCO3 18.5* 11/11/2013 1642   TCO2 20 11/11/2013 1642   ACIDBASEDEF 7.0* 11/11/2013 1642   O2SAT 63.1 11/19/2013 0425   CBG (last 3)   Recent Labs  11/18/13 2019 11/18/13 2328 11/19/13 0338  GLUCAP 178* 178* 139*      Assessment/Plan: S/P Procedure(s) (LRB): CORONARY ARTERY BYPASS GRAFTING (CABG), on pump, times two, using left internal mammary artery, cryo saphenous vein. (N/A) INTRAOPERATIVE TRANSESOPHAGEAL ECHOCARDIOGRAM (N/A) MITRAL VALVE (MV) REPLACEMENT (N/A)  1. CV- Atrial Fibrillation, rate controlled- will rebolus with IV Amiodarone, continue Amiodarone drip Coumadin started yesterday INR 1.20- wean Milirinone and Dopamine as creatinine stabilizes 2. Pulm- wean oxygen as tolerated, worsening atelectasis on CXR- will add Mucomyst nebs, encouraged good use of Flutter valve 3. Renal- creatinine slowly trending down, currently at 3.00, remains hypervolemic, good U/O with Lasix, Nephrology following 4. DM- CBGs, remain controlled, have been trending upward slighly, continue SSIP can add home insulin regimen as needed if sugars get elevated 5. Deconditioning- patient needs to ambulate, PT recs SNF at discharge 6. Dispo- patient slowly progressing, will d/c EPW today, add Mucomyst for worsening atelectasis, continue Amiodarone, Milrinone, Dopamine    LOS: 14 days    Claudia [name] 11/19/2013

## 2013-11-19 NOTE — Progress Notes (Signed)
Subjective:   Up in the chair Says "lot of gas" Had episode of nausea and chest pain earlier today - "got a pain pill and it went away"  Objective Vital signs in last 24 hours: Filed Vitals:   11/19/13 0700 11/19/13 0800 11/19/13 0808 11/19/13 0900  BP: 115/56 102/67  111/58  Pulse: 84 122  50  Temp:   97 F (36.1 C)   TempSrc:   Axillary   Resp: 24 22  17   Height:      Weight:      SpO2: 94% 96%  81%   Weight change: 3.4 kg (7 lb 7.9 oz)  Intake/Output Summary (Last 24 hours) at 11/19/13 0955 Last data filed at 11/19/13 0900  Gross per 24 hour  Intake 2091.22 ml  Output   2560 ml  Net -468.78 ml   Weight Trending 11/19/13 106.4 kg (234 lb 9.1 oz)  (? Validity) 11/18/13 0500 103 kg (227 lb 1.2 oz) 11/17/13 0500 104.9 kg (231 lb 4.2 oz) 11/16/13 0630 110.1 kg (242 lb 11.6 oz)   11/15/13 0500 110.2 kg (242 lb 15.2 oz)  11/14/13 0645 110 kg (242 lb 8.1 oz) 11/13/13 0500 107.8 kg (237 lb 10.5 oz)  11/12/13 0500 105.3 kg (232 lb 2.3 oz)  11/11/13 0530 101.5 kg (223 lb 12.3 oz)  11/10/13 0630 104 kg (229 lb 4.5 oz)  11/09/13 0200 98.4 kg (216 lb 14.9 oz) 11/08/13 1924 86.7 kg (191 lb 2.2 oz) 11/05/13 1330 86.7 kg (191 lb   Physical Exam: General: chronically ill appearing Heart: tachy irreg S1S2 No S3   Lungs: Ant clear, chest incision dry. Post has basilar crackles Abdomen: soft,non tender Extremities: pitting edema to the hips Foley with clear yellow urine  Labs: Basic Metabolic Panel:  Recent Labs Lab 11/14/13 0430 11/14/13 1500  11/17/13 0418 11/18/13 0430 11/19/13 0420  NA 123* 123*  < > 119* 120* 121*  K 4.5 4.9  < > 3.4* 3.4* 4.0  CL 87* 85*  < > 79* 78* 79*  CO2 21 22  < > 24 25 27   GLUCOSE 132* 168*  < > 180* 136* 152*  BUN 60* 62*  < > 74* 75* 76*  CREATININE 3.60* 3.48*  < > 3.11* 3.01* 3.00*  CALCIUM 7.8* 8.0*  < > 8.0* 8.3* 8.2*  PHOS  --  5.0*  --   --   --   --   < > = values in this interval not displayed.   Recent Labs Lab  11/17/13 0418 11/18/13 0430 11/19/13 0420  AST 16 15 27   ALT 44* 35 28  ALKPHOS 79 76 88  BILITOT 0.6 0.6 0.6  PROT 5.0* 5.3* 5.6*  ALBUMIN 2.3* 2.4* 2.4*   Recent Labs Lab 11/15/13 0330 11/16/13 0515 11/17/13 0418 11/18/13 0430 11/19/13 0420  WBC 10.8* 8.6 9.6 9.9 10.5  HGB 9.4* 9.3* 9.1* 9.2* 9.2*  HCT 26.4* 26.8* 25.9* 25.8* 26.2*  MCV 84.1 84.0 83.8 84.3 84.0  PLT 126* 162 182 180 189    Recent Labs Lab 11/18/13 1543 11/18/13 2019 11/18/13 2328 11/19/13 0338 11/19/13 0804  GLUCAP 190* 178* 178* 139* 125*   Studies/Results: Dg Chest Port 1 View  11/19/2013   CLINICAL DATA:  Atelectasis and effusion  EXAM: PORTABLE CHEST - 1 VIEW  COMPARISON:  November 18, 2013  FINDINGS: There has been slight clearing of consolidation from the left mid lung. Patchy left mid lung consolidation remains. There is atelectatic change in the right  mid lung as well, stable. There are small pleural effusions bilaterally. Heart is mildly enlarged with pulmonary vascularity within normal limits. Central catheter tip is at the cavoatrial junction without pneumothorax. Patient is status post mitral valve replacement.  IMPRESSION: Slight clearing of consolidation left mid lung. Patchy consolidation and atelectasis remain in this area. There is stable atelectasis in the right mid lung. No new opacity is seen. Cardiomegaly is stable. There are small effusions bilaterally. No pneumothorax.   Electronically Signed   By: Lowella Grip M.D.   On: 11/19/2013 08:04   Dg Chest Port 1 View  11/18/2013   CLINICAL DATA:  Status post valve repair  EXAM: PORTABLE CHEST - 1 VIEW  COMPARISON:  11/17/2013  FINDINGS: Right-sided central venous line is again identified and stable. The cardiac shadow is stable. Postoperative changes are again seen. The degree of lung aeration is stable. Increasing atelectatic changes are noted bilaterally particularly in the left mid lung. Small bilateral pleural effusions are noted.  No bony abnormality is seen.  IMPRESSION: Increasing atelectatic changes particularly in the left mid lung. Continued followup is recommended.   Electronically Signed   By: Inez Catalina M.D.   On: 11/18/2013 08:13   Medications: Infusions: . sodium chloride 10 mL/hr at 11/19/13 0800  . sodium chloride 10 mL (11/14/13 1817)  . amiodarone 30 mg/hr (11/19/13 0900)  . DOPamine 3 mcg/kg/min (11/19/13 0900)  . heparin 1,600 Units/hr (11/19/13 0841)    Scheduled Medications: . sodium chloride   Intravenous Once  . sodium chloride   Intravenous Once  . sodium chloride   Intravenous Once  . acetylcysteine  2 mL Nebulization TID  . bisacodyl  10 mg Oral Daily   Or  . bisacodyl  10 mg Rectal Daily  . cefTAZidime (FORTAZ)  IV  1 g Intravenous Q24H  . coumadin book   Does not apply Once  . docusate  200 mg Oral Daily  . feeding supplement (GLUCERNA SHAKE)  237 mL Oral Q1500  . furosemide  160 mg Intravenous Q6H  . insulin aspart  0-24 Units Subcutaneous 6 times per day  . lactulose  20 g Oral Daily  . magic mouthwash  5 mL Oral TID  . metoCLOPramide (REGLAN) injection  10 mg Intravenous 4 times per day  . metolazone  5 mg Oral Daily  . metoprolol tartrate  12.5 mg Oral BID   Or  . metoprolol tartrate  12.5 mg Per Tube BID  . nystatin cream   Topical TID  . pantoprazole (PROTONIX) IV  40 mg Intravenous Q24H  . potassium chloride  40 mEq Oral Daily  . sodium chloride  10-40 mL Intracatheter Q12H  . warfarin  2 mg Oral Daily  . warfarin   Does not apply Once  . Warfarin - Pharmacist Dosing Inpatient   Does not apply q65    Background 80 year old female with multiple medical issues including stage III CKD who is status post CABG and mitral valve replacement with postoperative acute on chronic renal failure (baseline creatinine around 1.5), marked volume overload  Assessment/Recommendations  Renal- acute kidney injury on chronic kidney disease in the setting of above. The insult was  most likely hemodynamic in nature/ATN and not unusual in this scenario. Of note, her pre-operative urinalysis looked like a possible urinary tract infection, but looks OK now. She does have a foley catheter in place. UOP has improved some with addition of zaroxolyn to furosemide.  No dialysis indications (but would accept of developed)  The fact that she did have CKD (crt 1.5) going into her operation will hamper her recovery.  Continue current diuretic dosing. If she could stay in sinus rhythm would be favorable to renal perfusion Hypertension/volume - Last CVP was 9 on 9/13.  She is on low dose dopamine (3 mcg/kg/minute). She is likely third spacing fluid but is also very positive with hyponatremia. Continue current diuretic program Anemia - situational and CKD- have given dose of aranesp - supportive care and PRN transfusion Electrolytes are mostly within normal limits - now on 40 mEq K daily Hyponatremia- Likely due to volume overload. Would anticipate if can maintain current rate of diuresis will slowly trend back up.  Confusion- sodium ? ICU? Seems appropriate again today. S/p CABG/MVR AFib on heparin, BB, amio  Jamal Maes, MD [facility] [phone] Pager 11/19/2013, 9:55 AM

## 2013-11-19 NOTE — Progress Notes (Signed)
Thank you for consult on Claudia [name]. She continues to have poor endurance with limited progress. Doubt that she will be able to tolerate three hours of therapy/daily. Concur  with PT recommendations for SNF for follow up therapy. Will defer CIR consult for now.

## 2013-11-20 ENCOUNTER — Inpatient Hospital Stay (HOSPITAL_COMMUNITY): Payer: Medicare HMO

## 2013-11-20 LAB — COMPREHENSIVE METABOLIC PANEL
ALT: 23 U/L (ref 0–35)
AST: 13 U/L (ref 0–37)
Albumin: 2.4 g/dL — ABNORMAL LOW (ref 3.5–5.2)
Alkaline Phosphatase: 79 U/L (ref 39–117)
Anion gap: 16 — ABNORMAL HIGH (ref 5–15)
BUN: 76 mg/dL — ABNORMAL HIGH (ref 6–23)
CALCIUM: 8.5 mg/dL (ref 8.4–10.5)
CO2: 27 meq/L (ref 19–32)
Chloride: 78 mEq/L — ABNORMAL LOW (ref 96–112)
Creatinine, Ser: 2.82 mg/dL — ABNORMAL HIGH (ref 0.50–1.10)
GFR calc Af Amer: 17 mL/min — ABNORMAL LOW (ref >90)
GFR calc non Af Amer: 15 mL/min — ABNORMAL LOW (ref >90)
GLUCOSE: 134 mg/dL — AB (ref 70–99)
Potassium: 3.8 mEq/L (ref 3.7–5.3)
SODIUM: 121 meq/L — AB (ref 137–147)
TOTAL PROTEIN: 5.7 g/dL — AB (ref 6.0–8.3)
Total Bilirubin: 0.6 mg/dL (ref 0.3–1.2)

## 2013-11-20 LAB — HEPARIN LEVEL (UNFRACTIONATED)
Heparin Unfractionated: 0.12 IU/mL — ABNORMAL LOW (ref 0.30–0.70)
Heparin Unfractionated: 0.47 IU/mL (ref 0.30–0.70)

## 2013-11-20 LAB — GLUCOSE, CAPILLARY
GLUCOSE-CAPILLARY: 180 mg/dL — AB (ref 70–99)
GLUCOSE-CAPILLARY: 114 mg/dL — AB (ref 70–99)
Glucose-Capillary: 107 mg/dL — ABNORMAL HIGH (ref 70–99)
Glucose-Capillary: 143 mg/dL — ABNORMAL HIGH (ref 70–99)
Glucose-Capillary: 149 mg/dL — ABNORMAL HIGH (ref 70–99)
Glucose-Capillary: 229 mg/dL — ABNORMAL HIGH (ref 70–99)

## 2013-11-20 LAB — PROTIME-INR
INR: 1.26 (ref 0.00–1.49)
Prothrombin Time: 15.8 seconds — ABNORMAL HIGH (ref 11.6–15.2)

## 2013-11-20 LAB — CARBOXYHEMOGLOBIN
Carboxyhemoglobin: 1.5 % (ref 0.5–1.5)
Methemoglobin: 0.8 % (ref 0.0–1.5)
O2 Saturation: 68.1 %
Total hemoglobin: 9.6 g/dL — ABNORMAL LOW (ref 12.0–16.0)

## 2013-11-20 LAB — PHOSPHORUS: Phosphorus: 5.3 mg/dL — ABNORMAL HIGH (ref 2.3–4.6)

## 2013-11-20 MED ORDER — OXYCODONE HCL 5 MG PO TABS
5.0000 mg | ORAL_TABLET | ORAL | Status: DC | PRN
  Administered 2013-11-20 – 2013-11-25 (×5): 5 mg via ORAL
  Filled 2013-11-20 (×5): qty 1

## 2013-11-20 MED ORDER — DARBEPOETIN ALFA-POLYSORBATE 100 MCG/0.5ML IJ SOLN
100.0000 ug | INTRAMUSCULAR | Status: DC
  Administered 2013-11-20 – 2013-12-11 (×3): 100 ug via SUBCUTANEOUS
  Filled 2013-11-20 (×6): qty 0.5

## 2013-11-20 MED ORDER — WARFARIN SODIUM 5 MG PO TABS
5.0000 mg | ORAL_TABLET | Freq: Once | ORAL | Status: AC
  Administered 2013-11-20: 5 mg via ORAL
  Filled 2013-11-20: qty 1

## 2013-11-20 NOTE — Progress Notes (Addendum)
ANTICOAGULATION CONSULT NOTE - Follow Up Consult  Pharmacy Consult for Heparin/coumadin Indication: atrial fibrillation  Allergies  Allergen Reactions  . Aspirin Other (See Comments)    High doses caused stomach bleeds    Patient Measurements: Height: 5\' 7"  (170.2 cm) Weight: 233 lb 0.4 oz (105.7 kg) IBW/kg (Calculated) : 61.6 Heparin Dosing Weight: 86.9 kg  Vital Signs: Temp: 97.4 F (36.3 C) (09/16 0810) Temp src: Oral (09/16 0810) BP: 119/43 mmHg (09/16 0900) Pulse Rate: 87 (09/16 0900)  Labs:  Recent Labs  11/18/13 0430 11/19/13 0420 11/19/13 0600 11/20/13 0411 11/20/13 0740  HGB 9.2* 9.2*  --   --   --   HCT 25.8* 26.2*  --   --   --   PLT 180 189  --   --   --   LABPROT 14.6  --  15.2 15.8*  --   INR 1.14  --  1.20 1.26  --   HEPARINUNFRC 0.28*  --  0.31  --  0.12*  CREATININE 3.01* 3.00*  --  2.82*  --     Estimated Creatinine Clearance: 20.2 ml/min (by C-G formula based on Cr of 2.82).   Medications:  Heparin @ 1600 units/hr  Assessment: 78 YOF s/p CABG with MVR who continues on heparin bridge to coumadin for intermittent Afib. Heparin level subtherapeutic (0.12) this a.m. RN reports heparin off yesterday ~1230-1530 for pacing wire removal; however, this should not still be affecting heparin level. INR 1.26  - no movement past 2 doses of coumadin 2mg . Pt continues on amiodarone which will potentiate effects of coumadin. 9/15 Hgb/Hct stable, plts stable at 189. No overt s/sx of bleeding noted.   Goal of Therapy:  Heparin level 0.3-0.4 units/ml (per Dr. Prescott Gum); INR 2-2.5 (per Dr. Prescott Gum) Monitor platelets by anticoagulation protocol: Yes   Plan:  1. Increase heparin to 1750 units/hr. D/c heparin when INR >/=2. 2. Will continue to monitor for any signs/symptoms of bleeding 3. Daily Heparin level, CBC, INR 4. Coumadin 5mg  tonight  Sherlon Handing, PharmD, BCPS Clinical pharmacist, pager [phone] 11/20/2013, 9:39 AM    Addendum: Heparin level  this evening is above the specific goal given to pharmacy, 0.47 on 1750 units/hr (goal 0.3-0.4). No bleeding noted.  Decrease heparin drip to 1700 units/hr Heparin level in the morning  [facility], Trion.D., BCPS Clinical Pharmacist Pager: [phone] 11/20/2013 7:59 PM

## 2013-11-20 NOTE — Progress Notes (Signed)
      [address]       [place], [address]             [phone]      12 Days Post-Op Procedure(s) (LRB): CORONARY ARTERY BYPASS GRAFTING (CABG), on pump, times two, using left internal mammary artery, cryo saphenous vein. (N/A) INTRAOPERATIVE TRANSESOPHAGEAL ECHOCARDIOGRAM (N/A) MITRAL VALVE (MV) REPLACEMENT (N/A)  Subjective:  Ms. Riera states she is feeling better today.  She states she thinks the addition of breathing treatments really helped her.  She is unsure if she coughed any sputum up.  + ambulation   + BM Objective: Vital signs in last 24 hours: Temp:  [97 F (36.1 C)-98.1 F (36.7 C)] 98.1 F (36.7 C) (09/16 0400) Pulse Rate:  [50-122] 78 (09/16 0700) Cardiac Rhythm:  [-] Normal sinus rhythm (09/16 0600) Resp:  [17-27] 19 (09/16 0700) BP: (96-132)/(39-88) 113/49 mmHg (09/16 0700) SpO2:  [81 %-100 %] 100 % (09/16 0727) Weight:  [233 lb 0.4 oz (105.7 kg)] 233 lb 0.4 oz (105.7 kg) (09/16 0500)  Intake/Output from previous day: 09/15 0701 - 09/16 0700 In: 5053 [P.O.:500; I.V.:1032; IV Piggyback:282] Out: 9767 [Urine:3425]  General appearance: alert, cooperative and no distress Heart: regular rate and rhythm Lungs: diminished breath sounds bibasilar Abdomen: soft, non-tender; bowel sounds normal; no masses,  no organomegaly Extremities: edema 3-4+ Wound: clean, some serous drainage present likely due to LE edema, staples remain in place  Lab Results:  Recent Labs  11/18/13 0430 11/19/13 0420  WBC 9.9 10.5  HGB 9.2* 9.2*  HCT 25.8* 26.2*  PLT 180 189   BMET:  Recent Labs  11/19/13 0420 11/20/13 0411  NA 121* 121*  K 4.0 3.8  CL 79* 78*  CO2 27 27  GLUCOSE 152* 134*  BUN 76* 76*  CREATININE 3.00* 2.82*  CALCIUM 8.2* 8.5    PT/INR:  Recent Labs  11/20/13 0411  LABPROT 15.8*  INR 1.26   ABG    Component Value Date/Time   PHART 7.318* 11/11/2013 1642   HCO3 18.5* 11/11/2013 1642   TCO2 20 11/11/2013 1642   ACIDBASEDEF 7.0*  11/11/2013 1642   O2SAT 68.1 11/20/2013 0449   CBG (last 3)   Recent Labs  11/19/13 1928 11/19/13 2336 11/20/13 0345  GLUCAP 169* 138* 143*    Assessment/Plan: S/P Procedure(s) (LRB): CORONARY ARTERY BYPASS GRAFTING (CABG), on pump, times two, using left internal mammary artery, cryo saphenous vein. (N/A) INTRAOPERATIVE TRANSESOPHAGEAL ECHOCARDIOGRAM (N/A) MITRAL VALVE (MV) REPLACEMENT (N/A)  1. CV- Converted to NSR yesterday at 5pm- Milrinone is off, remains on IV Dopamine, Amiodarone, and Lorpessor 2. Pulm- improvement of bilateral atelectasis, continue aggressive pulm toilet with flutter valve, mucomyst nebs 3. Renal- creatinine continues to trend down, good U/O with Lasix drip, not much improvement in LE edema remains 40 lbs above baseline weight- Nephrology following 4. DM- CBGs remain controlled, continue current regimen 5. Deconditioning- needs to ambulate, SNF at discharge 6. Dispo- patient slowly progressing, INR 1.26 will increase dose today, weaning drips as tolerated, if maintains NSR today can possibly convert Amiodarone to oral regimen, continue current care   LOS: 15 days    Ahmed Prima, Cassadee Vanzandt 11/20/2013

## 2013-11-20 NOTE — Progress Notes (Signed)
Patient ID: Claudia [name], female   DOB: March 13, 1931, 80 y.o.   MRN: [id number] EVENING ROUNDS NOTE :     [address]       [place] [address]             [phone]                 12 Days Post-Op Procedure(s) (LRB): CORONARY ARTERY BYPASS GRAFTING (CABG), on pump, times two, using left internal mammary artery, cryo saphenous vein. (N/A) INTRAOPERATIVE TRANSESOPHAGEAL ECHOCARDIOGRAM (N/A) MITRAL VALVE (MV) REPLACEMENT (N/A)  Total Length of Stay:  LOS: 15 days  BP 108/47  Pulse 81  Temp(Src) 97.8 F (36.6 C) (Oral)  Resp 16  Ht 5\' 7"  (1.702 m)  Wt 233 lb 0.4 oz (105.7 kg)  BMI 36.49 kg/m2  SpO2 100%  .Intake/Output     09/16 0701 - 09/17 0700   P.O. 540   I.V. (mL/kg) 536.2 (5.1)   IV Piggyback 216   Total Intake(mL/kg) 1292.2 (12.2)   Urine (mL/kg/hr) 1335 (0.9)   Total Output 1335   Net -42.8       Stool Occurrence 1 x     . sodium chloride 10 mL/hr at 11/20/13 0800  . sodium chloride 10 mL (11/14/13 1817)  . amiodarone 30 mg/hr (11/20/13 1900)  . DOPamine 3 mcg/kg/min (11/20/13 1900)  . heparin 1,750 Units/hr (11/20/13 1900)     Lab Results  Component Value Date   WBC 10.5 11/19/2013   HGB 9.2* 11/19/2013   HCT 26.2* 11/19/2013   PLT 189 11/19/2013   GLUCOSE 134* 11/20/2013   CHOL 125 12/03/2012   TRIG 42 12/03/2012   HDL 72 12/03/2012   LDLCALC 45 12/03/2012   ALT 23 11/20/2013   AST 13 11/20/2013   NA 121* 11/20/2013   K 3.8 11/20/2013   CL 78* 11/20/2013   CREATININE 2.82* 11/20/2013   BUN 76* 11/20/2013   CO2 27 11/20/2013   TSH 0.596 12/02/2012   INR 1.26 11/20/2013   HGBA1C 7.2* 11/06/2013   More talkative Wbc improved Tomorrow consider plavix and d/c antibiotics  Grace Isaac MD  Beeper [phone] Office [phone] 11/20/2013 8:18 PM

## 2013-11-20 NOTE — Progress Notes (Signed)
Subjective:   Just worked with PT Endurance poor No pain, just SOB Rate of diuresis stable Still 50 lb over preop weight  Objective Vital signs in last 24 hours: Filed Vitals:   11/20/13 0700 11/20/13 0727 11/20/13 0800 11/20/13 0810  BP: 113/49  125/45   Pulse: 78  80   Temp:    97.4 F (36.3 C)  TempSrc:    Oral  Resp: 19  20   Height:      Weight:      SpO2: 100% 100% 100%    Weight change: -0.7 kg (-1 lb 8.7 oz)  Intake/Output Summary (Last 24 hours) at 11/20/13 0904 Last data filed at 11/20/13 0800  Gross per 24 hour  Intake 1593.8 ml  Output   3600 ml  Net -2006.2 ml   Weight Trending 11/20/13 0500 105.7 kg (233 lb 0.4 oz 11/19/13 106.4 kg (234 lb 9.1 oz)  (? Validity) 11/18/13 0500 103 kg (227 lb 1.2 oz) 11/17/13 0500 104.9 kg (231 lb 4.2 oz) 11/16/13 0630 110.1 kg (242 lb 11.6 oz)   11/15/13 0500 110.2 kg (242 lb 15.2 oz)  11/14/13 0645 110 kg (242 lb 8.1 oz) 11/13/13 0500 107.8 kg (237 lb 10.5 oz)  11/12/13 0500 105.3 kg (232 lb 2.3 oz)  11/11/13 0530 101.5 kg (223 lb 12.3 oz)  11/10/13 0630 104 kg (229 lb 4.5 oz)  11/09/13 0200 98.4 kg (216 lb 14.9 oz) 11/08/13 1924 86.7 kg (191 lb 2.2 oz) 11/05/13 1330 86.7 kg (191 lb                      Pre op weight  Physical Exam: General: chronically ill appearing, pleasant, in the chair Heart: Regular rhythm today (NSR since last PM)   Lungs: Ant clear, chest incision dry. Abdomen: soft,non tender Extremities: pitting edema to the hips 3+ Foley with clear yellow urine  Labs: Basic Metabolic Panel:  Recent Labs Lab 11/14/13 0430 11/14/13 1500  11/18/13 0430 11/19/13 0420 11/20/13 0411  NA 123* 123*  < > 120* 121* 121*  K 4.5 4.9  < > 3.4* 4.0 3.8  CL 87* 85*  < > 78* 79* 78*  CO2 21 22  < > 25 27 27   GLUCOSE 132* 168*  < > 136* 152* 134*  BUN 60* 62*  < > 75* 76* 76*  CREATININE 3.60* 3.48*  < > 3.01* 3.00* 2.82*  CALCIUM 7.8* 8.0*  < > 8.3* 8.2* 8.5  PHOS  --  5.0*  --   --   --  5.3*  < > =  values in this interval not displayed.   Recent Labs Lab 11/18/13 0430 11/19/13 0420 11/20/13 0411  AST 15 27 13   ALT 35 28 23  ALKPHOS 76 88 79  BILITOT 0.6 0.6 0.6  PROT 5.3* 5.6* 5.7*  ALBUMIN 2.4* 2.4* 2.4*    Recent Labs Lab 11/15/13 0330 11/16/13 0515 11/17/13 0418 11/18/13 0430 11/19/13 0420  WBC 10.8* 8.6 9.6 9.9 10.5  HGB 9.4* 9.3* 9.1* 9.2* 9.2*  HCT 26.4* 26.8* 25.9* 25.8* 26.2*  MCV 84.1 84.0 83.8 84.3 84.0  PLT 126* 162 182 180 189    Recent Labs Lab 11/19/13 1552 11/19/13 1928 11/19/13 2336 11/20/13 0345 11/20/13 0807  GLUCAP 144* 169* 138* 143* 107*   Studies/Results: Dg Chest Port 1 View  11/20/2013   CLINICAL DATA:  Followup pulmonary status.  EXAM: PORTABLE CHEST - 1 VIEW  COMPARISON:  11/19/2013.  FINDINGS:  The heart is enlarged but stable. Stable surgical changes from valve replacement surgery. Overall the lungs demonstrate improved aeration with clearing edema. There is persistent left lower lobe airspace consolidation and streaky right basilar atelectasis. No large pleural effusions.  IMPRESSION: Improved aeration.  Persistent left lower lobe airspace consolidation and probable small effusion.  Streaky right basilar atelectasis.   Electronically Signed   By: Kalman Jewels M.D.   On: 11/20/2013 08:22   Dg Chest Port 1 View  11/19/2013   CLINICAL DATA:  Atelectasis and effusion  EXAM: PORTABLE CHEST - 1 VIEW  COMPARISON:  November 18, 2013  FINDINGS: There has been slight clearing of consolidation from the left mid lung. Patchy left mid lung consolidation remains. There is atelectatic change in the right mid lung as well, stable. There are small pleural effusions bilaterally. Heart is mildly enlarged with pulmonary vascularity within normal limits. Central catheter tip is at the cavoatrial junction without pneumothorax. Patient is status post mitral valve replacement.  IMPRESSION: Slight clearing of consolidation left mid lung. Patchy consolidation  and atelectasis remain in this area. There is stable atelectasis in the right mid lung. No new opacity is seen. Cardiomegaly is stable. There are small effusions bilaterally. No pneumothorax.   Electronically Signed   By: Lowella Grip M.D.   On: 11/19/2013 08:04   Medications: Infusions: . sodium chloride 10 mL/hr at 11/20/13 0800  . sodium chloride 10 mL (11/14/13 1817)  . amiodarone 30 mg/hr (11/20/13 0800)  . DOPamine 3 mcg/kg/min (11/20/13 0800)  . heparin 1,600 Units/hr (11/20/13 0800)    Scheduled Medications: . sodium chloride   Intravenous Once  . sodium chloride   Intravenous Once  . sodium chloride   Intravenous Once  . acetylcysteine  3 mL Nebulization TID  . bisacodyl  10 mg Oral Daily   Or  . bisacodyl  10 mg Rectal Daily  . cefTAZidime (FORTAZ)  IV  1 g Intravenous Q24H  . docusate  200 mg Oral Daily  . feeding supplement (GLUCERNA SHAKE)  237 mL Oral Q1500  . furosemide  160 mg Intravenous Q6H  . insulin aspart  0-24 Units Subcutaneous 6 times per day  . lactulose  20 g Oral Daily  . levalbuterol  0.63 mg Nebulization TID  . magic mouthwash  5 mL Oral TID  . metolazone  5 mg Oral Daily  . metoprolol tartrate  12.5 mg Oral BID   Or  . metoprolol tartrate  12.5 mg Per Tube BID  . nystatin cream   Topical TID  . pantoprazole (PROTONIX) IV  40 mg Intravenous Q24H  . potassium chloride  40 mEq Oral Daily  . sodium chloride  10-40 mL Intracatheter Q12H  . warfarin  2 mg Oral Daily  . Warfarin - Pharmacist Dosing Inpatient   Does not apply q38    Background 80 year old female with multiple medical issues including stage III CKD who is status post CABG and mitral valve replacement with postoperative acute on chronic renal failure (baseline creatinine around 1.5), marked volume overload (50 lb +)  Assessment/Recommendations  Renal- acute kidney injury on chronic kidney disease in the setting of above. The insult was most likely hemodynamic in nature/ATN and  not unusual in this scenario. UOP has improved some with addition of zaroxolyn to furosemide.  No dialysis indications (but would accept of developed) The fact that she did have CKD (crt 1.5) going into her operation has hampered recovery, but creatinine is slowly trending down over past  6 days.  Continue current diuretic dosing. If she could stay in sinus rhythm would be favorable to renal perfusion Hypertension/volume - Last CVP was 9 on 9/13.  She remains on low dose dopamine (3 mcg/kg/minute - of ? benefit but no harm).  Continue current diuretic program Anemia - situational and CKD- have given dose of aranesp (9/9 -  100 mcg - will redose weekly for now). Check iron studies - ordered for 9/17) Hypokalemia -  stable - now on 40 mEq K daily Hyponatremia- Dilutional and most likely due to volume overload. Would anticipate if can maintain current rate of diuresis will slowly trend back up.  Confusion- resolved S/p CABG/MVR AFib on heparin, BB, amio  Jamal Maes, MD [facility] [phone] Pager 11/20/2013, 9:04 AM

## 2013-11-20 NOTE — Progress Notes (Signed)
CRITICAL VALUE ALERT  Critical value received:  Na 121   Date of notification:  11/20/2013  Time of notification:  7225  Critical value read back:Yes.    Nurse who received alert:  Lilyan Gilford  Value comparable to past 4 days. MD not notified. Will pass along in AM rounds.

## 2013-11-20 NOTE — Progress Notes (Signed)
Physical Therapy Treatment Patient Details Name: Claudia [name] MRN: [id number] DOB: 12-25-1931 Today's Date: 11/20/2013    History of Present Illness Pt adm with chest pain and underwent urgent CABG x 2 and MVR on 11/09/13. PMH - HTN, MI, DM    PT Comments    Pt feels very poorly (nauseous, sleeping poorly per RN) and sounding defeated. Brightens a bit when discussing her family (especially her grandaughter) and states she wants to "get through this" to return home to her. Limited by nausea and orthostasis with sit to stand. Unable to ambulate at this time. RN reports pt has most recently done better in the afternoons. Will attempt to see her in p.m. As able.    Follow Up Recommendations  SNF     Equipment Recommendations  None recommended by PT    Recommendations for Other Services OT consult     Precautions / Restrictions Precautions Precautions: Fall;Sternal    Mobility  Bed Mobility                  Transfers Overall transfer level: Needs assistance Equipment used: Rolling walker (2 wheeled) Transfers: Sit to/from Stand Sit to Stand: Max assist;+2 physical assistance;From elevated surface         General transfer comment: recliner with pillow in seat on arrival; poor anterior translation and resists anterior wt-shift (pushing posteriorly) with sit to stand; repeated x 2 with prolonged rest and encouragement due to nausea (pre-medicated) and orthostasis  Ambulation/Gait             General Gait Details: unable due to orthostasis   Stairs            Wheelchair Mobility    Modified Rankin (Stroke Patients Only)       Balance Overall balance assessment: Needs assistance         Standing balance support: Bilateral upper extremity supported Standing balance-Leahy Scale: Zero Standing balance comment: no use of hands to come to stand and then lite UE support on RW                     Cognition Arousal/Alertness:  Awake/alert Behavior During Therapy: [facility] for tasks assessed/performed Overall Cognitive Status: Within Functional Limits for tasks assessed                      Exercises General Exercises - Upper Extremity Shoulder Flexion: AROM;Both;5 reps (to 90 ) Elbow Flexion: AROM;Both;5 reps Elbow Extension: AROM;Both;5 reps Digit Composite Flexion: AROM;Both;10 reps (all UE exercises in sitting to assist with incr BP) General Exercises - Lower Extremity Ankle Circles/Pumps: AROM;Both;20 reps Quad Sets: AROM;Both;10 reps Long Arc Quad: AROM;Both    General Comments General comments (skin integrity, edema, etc.): pt reports feeling nauseous all the time (has not vomited per pt); has not been eating or sleeping well per RN; max encouragement (which pt responded well to) re: goals for "down the road" ; RN reports she does better in the pm       Pertinent Vitals/Pain Seated feet elevated BP 125/45 (65) After sit to stand and seated 109/40 (56) Seated UE exercises with BP 123/49  Pain Assessment: Faces Faces Pain Scale: Hurts little more Pain Location: sternum Pain Intervention(s): Premedicated before session;Repositioned;Limited activity within patient's tolerance;Monitored during session    Home Living                      Prior Function  PT Goals (current goals can now be found in the care plan section) Acute Rehab PT Goals Patient Stated Goal: Return home and see Cassie (grandaughter) Progress towards PT goals: Not progressing toward goals - comment (orthostatic; nauseaous)    Frequency  Min 3X/week    PT Plan Current plan remains appropriate    Co-evaluation             End of Session Equipment Utilized During Treatment: Gait belt;Oxygen Activity Tolerance: Treatment limited secondary to medical complications (Comment) (orthostasis) Patient left: in chair;with call bell/phone within reach;with nursing/sitter in room     Time: 0825-0901 PT  Time Calculation (min): 36 min  Charges:  $Therapeutic Activity: 23-37 mins                    G Codes:      Lecil Tapp Nov 28, 2013, 10:26 AM Pager [phone]

## 2013-11-21 ENCOUNTER — Inpatient Hospital Stay (HOSPITAL_COMMUNITY): Payer: Medicare HMO

## 2013-11-21 LAB — URINALYSIS, ROUTINE W REFLEX MICROSCOPIC
Bilirubin Urine: NEGATIVE
Glucose, UA: NEGATIVE mg/dL
KETONES UR: NEGATIVE mg/dL
NITRITE: NEGATIVE
Protein, ur: NEGATIVE mg/dL
Specific Gravity, Urine: 1.011 (ref 1.005–1.030)
Urobilinogen, UA: 0.2 mg/dL (ref 0.0–1.0)
pH: 5 (ref 5.0–8.0)

## 2013-11-21 LAB — URINE MICROSCOPIC-ADD ON

## 2013-11-21 LAB — COMPREHENSIVE METABOLIC PANEL
ALT: 31 U/L (ref 0–35)
ANION GAP: 17 — AB (ref 5–15)
AST: 26 U/L (ref 0–37)
Albumin: 2.3 g/dL — ABNORMAL LOW (ref 3.5–5.2)
Alkaline Phosphatase: 113 U/L (ref 39–117)
BILIRUBIN TOTAL: 0.6 mg/dL (ref 0.3–1.2)
BUN: 78 mg/dL — ABNORMAL HIGH (ref 6–23)
CHLORIDE: 75 meq/L — AB (ref 96–112)
CO2: 27 meq/L (ref 19–32)
Calcium: 8.2 mg/dL — ABNORMAL LOW (ref 8.4–10.5)
Creatinine, Ser: 2.92 mg/dL — ABNORMAL HIGH (ref 0.50–1.10)
GFR calc Af Amer: 17 mL/min — ABNORMAL LOW (ref >90)
GFR, EST NON AFRICAN AMERICAN: 14 mL/min — AB (ref >90)
Glucose, Bld: 141 mg/dL — ABNORMAL HIGH (ref 70–99)
POTASSIUM: 3.8 meq/L (ref 3.7–5.3)
Sodium: 119 mEq/L — CL (ref 137–147)
Total Protein: 5.6 g/dL — ABNORMAL LOW (ref 6.0–8.3)

## 2013-11-21 LAB — PROTIME-INR
INR: 1.37 (ref 0.00–1.49)
Prothrombin Time: 16.9 seconds — ABNORMAL HIGH (ref 11.6–15.2)

## 2013-11-21 LAB — CARBOXYHEMOGLOBIN
Carboxyhemoglobin: 1.4 % (ref 0.5–1.5)
Methemoglobin: 0.7 % (ref 0.0–1.5)
O2 Saturation: 60.2 %
Total hemoglobin: 11.2 g/dL — ABNORMAL LOW (ref 12.0–16.0)

## 2013-11-21 LAB — GLUCOSE, CAPILLARY
Glucose-Capillary: 137 mg/dL — ABNORMAL HIGH (ref 70–99)
Glucose-Capillary: 122 mg/dL — ABNORMAL HIGH (ref 70–99)
Glucose-Capillary: 140 mg/dL — ABNORMAL HIGH (ref 70–99)
Glucose-Capillary: 171 mg/dL — ABNORMAL HIGH (ref 70–99)
Glucose-Capillary: 162 mg/dL — ABNORMAL HIGH (ref 70–99)

## 2013-11-21 LAB — IRON AND TIBC
Iron: 19 ug/dL — ABNORMAL LOW (ref 42–135)
Saturation Ratios: 7 % — ABNORMAL LOW (ref 20–55)
TIBC: 256 ug/dL (ref 250–470)
UIBC: 237 ug/dL (ref 125–400)

## 2013-11-21 LAB — FERRITIN: Ferritin: 604 ng/mL — ABNORMAL HIGH (ref 10–291)

## 2013-11-21 LAB — CBC
HCT: 28.2 % — ABNORMAL LOW (ref 36.0–46.0)
Hemoglobin: 9.8 g/dL — ABNORMAL LOW (ref 12.0–15.0)
MCH: 29.4 pg (ref 26.0–34.0)
MCHC: 34.8 g/dL (ref 30.0–36.0)
MCV: 84.7 fL (ref 78.0–100.0)
Platelets: 156 10*3/uL (ref 150–400)
RBC: 3.33 MIL/uL — ABNORMAL LOW (ref 3.87–5.11)
RDW: 15.9 % — ABNORMAL HIGH (ref 11.5–15.5)
WBC: 30.8 10*3/uL — ABNORMAL HIGH (ref 4.0–10.5)

## 2013-11-21 LAB — HEPARIN LEVEL (UNFRACTIONATED): Heparin Unfractionated: 0.18 IU/mL — ABNORMAL LOW (ref 0.30–0.70)

## 2013-11-21 MED ORDER — HEPARIN (PORCINE) IN NACL 100-0.45 UNIT/ML-% IJ SOLN
1850.0000 [IU]/h | INTRAMUSCULAR | Status: DC
  Administered 2013-11-21: 1800 [IU]/h via INTRAVENOUS
  Administered 2013-11-22: 1850 [IU]/h via INTRAVENOUS
  Filled 2013-11-21 (×2): qty 250

## 2013-11-21 MED ORDER — LIDOCAINE HCL 1 % IJ SOLN
INTRAMUSCULAR | Status: AC
  Filled 2013-11-21: qty 20

## 2013-11-21 MED ORDER — PIPERACILLIN-TAZOBACTAM IN DEX 2-0.25 GM/50ML IV SOLN
2.2500 g | Freq: Four times a day (QID) | INTRAVENOUS | Status: DC
  Administered 2013-11-21 – 2013-11-24 (×11): 2.25 g via INTRAVENOUS
  Filled 2013-11-21 (×13): qty 50

## 2013-11-21 MED ORDER — WARFARIN SODIUM 5 MG PO TABS
5.0000 mg | ORAL_TABLET | Freq: Every day | ORAL | Status: DC
  Administered 2013-11-21 – 2013-11-24 (×4): 5 mg via ORAL
  Filled 2013-11-21 (×5): qty 1

## 2013-11-21 MED ORDER — AMIODARONE HCL 200 MG PO TABS
200.0000 mg | ORAL_TABLET | Freq: Two times a day (BID) | ORAL | Status: DC
  Administered 2013-11-21 – 2013-11-26 (×10): 200 mg via ORAL
  Filled 2013-11-21 (×13): qty 1

## 2013-11-21 MED ORDER — FLUCONAZOLE 100MG IVPB
100.0000 mg | INTRAVENOUS | Status: AC
  Administered 2013-11-21 – 2013-11-22 (×2): 100 mg via INTRAVENOUS
  Filled 2013-11-21 (×2): qty 50

## 2013-11-21 MED ORDER — SODIUM CHLORIDE 0.9 % IV SOLN
1500.0000 mg | INTRAVENOUS | Status: DC
  Administered 2013-11-21 – 2013-11-23 (×2): 1500 mg via INTRAVENOUS
  Filled 2013-11-21 (×2): qty 1500

## 2013-11-21 MED ORDER — PIPERACILLIN-TAZOBACTAM IN DEX 2-0.25 GM/50ML IV SOLN
2.2500 g | INTRAVENOUS | Status: AC
  Administered 2013-11-21: 2.25 g via INTRAVENOUS
  Filled 2013-11-21: qty 50

## 2013-11-21 MED ORDER — WARFARIN - PHYSICIAN DOSING INPATIENT
Freq: Every day | Status: DC
  Administered 2013-11-21 – 2013-12-12 (×6)

## 2013-11-21 MED ORDER — BACITRACIN-NEOMYCIN-POLYMYXIN OINTMENT TUBE
TOPICAL_OINTMENT | Freq: Every day | CUTANEOUS | Status: DC
  Administered 2013-11-21 – 2013-11-25 (×5): via TOPICAL
  Administered 2013-11-26: 1 via TOPICAL
  Administered 2013-11-27 – 2013-12-12 (×16): via TOPICAL
  Filled 2013-11-21: qty 15

## 2013-11-21 NOTE — Progress Notes (Signed)
Subjective:   Patient seen this AM, sitting up in chair, looks better this morning. Still complaining of some nausea, and lower back pain. Says her breathing is somewhat improved.  Weight decreased ~3 lbs since yesterday Still net +8.2L since admission.  Cr slightly increased to 2.92 this AM (from 2.82) WBC has jumped to 30,000 without obvious source  Objective Vital signs in last 24 hours: Filed Vitals:   11/21/13 0400 11/21/13 0500 11/21/13 0600 11/21/13 0700  BP: 113/56 118/43 116/47 113/50  Pulse: 70 72 80 77  Temp: 97.8 F (36.6 C)     TempSrc: Oral     Resp: 20 24 30 20   Height:      Weight:  230 lb 6.1 oz (104.5 kg)    SpO2: 100% 100% 100% 97%   Weight change: -2 lb 10.3 oz (-1.2 kg)  Intake/Output Summary (Last 24 hours) at 11/21/13 0730 Last data filed at 11/21/13 0600  Gross per 24 hour  Intake 1895.68 ml  Output   2560 ml  Net -664.32 ml   Weight Trending 11/21/13 0500 104.5 kg (230 lb 6.1 oz) 11/20/13 0500 105.7 kg (233 lb 0.4 oz 11/19/13 106.4 kg (234 lb 9.1 oz)  (? Validity) 11/18/13 0500 103 kg (227 lb 1.2 oz) 11/17/13 0500 104.9 kg (231 lb 4.2 oz) 11/16/13 0630 110.1 kg (242 lb 11.6 oz)   11/15/13 0500 110.2 kg (242 lb 15.2 oz)  11/14/13 0645 110 kg (242 lb 8.1 oz) 11/13/13 0500 107.8 kg (237 lb 10.5 oz)  11/12/13 0500 105.3 kg (232 lb 2.3 oz)  11/11/13 0530 101.5 kg (223 lb 12.3 oz)  11/10/13 0630 104 kg (229 lb 4.5 oz)  11/09/13 0200 98.4 kg (216 lb 14.9 oz) 11/08/13 1924 86.7 kg (191 lb 2.2 oz) 11/05/13 1330 86.7 kg (191 lb                      Pre op weight  Physical Exam:  General: Obese female, alert, cooperative, NAD. HEENT: PERRL, EOMI. Moist mucus membranes Neck: Full range of motion without pain, supple, no lymphadenopathy or carotid bruits Lungs: Clear to ascultation bilaterally, normal work of respiration, no wheezes, rales, rhonchi Heart: RRR, no murmurs, gallops, or rubs Abdomen: Soft, non-tender, non-distended, BS + Extremities: +3  pitting edema extending to the hip Neurologic: Alert & oriented X3, cranial nerves II-XII intact, strength grossly intact, sensation intact to light touch    Labs: Basic Metabolic Panel:  Recent Labs Lab 11/14/13 1500  11/19/13 0420 11/20/13 0411 11/21/13 0253  NA 123*  < > 121* 121* 119*  K 4.9  < > 4.0 3.8 3.8  CL 85*  < > 79* 78* 75*  CO2 22  < > 27 27 27   GLUCOSE 168*  < > 152* 134* 141*  BUN 62*  < > 76* 76* 78*  CREATININE 3.48*  < > 3.00* 2.82* 2.92*  CALCIUM 8.0*  < > 8.2* 8.5 8.2*  PHOS 5.0*  --   --  5.3*  --   < > = values in this interval not displayed.   Recent Labs Lab 11/19/13 0420 11/20/13 0411 11/21/13 0253  AST 27 13 26   ALT 28 23 31   ALKPHOS 88 79 113  BILITOT 0.6 0.6 0.6  PROT 5.6* 5.7* 5.6*  ALBUMIN 2.4* 2.4* 2.3*    Recent Labs Lab 11/16/13 0515 11/17/13 0418 11/18/13 0430 11/19/13 0420 11/21/13 0253  WBC 8.6 9.6 9.9 10.5 30.8*  HGB 9.3* 9.1* 9.2*  9.2* 9.8*  HCT 26.8* 25.9* 25.8* 26.2* 28.2*  MCV 84.0 83.8 84.3 84.0 84.7  PLT 162 182 180 189 156    Recent Labs Lab 11/20/13 1154 11/20/13 1524 11/20/13 1922 11/20/13 2330 11/21/13 0342  GLUCAP 180* 114* 149* 229* 137*   Studies/Results: Dg Chest Port 1 View  11/20/2013   CLINICAL DATA:  Followup pulmonary status.  EXAM: PORTABLE CHEST - 1 VIEW  COMPARISON:  11/19/2013.  FINDINGS: The heart is enlarged but stable. Stable surgical changes from valve replacement surgery. Overall the lungs demonstrate improved aeration with clearing edema. There is persistent left lower lobe airspace consolidation and streaky right basilar atelectasis. No large pleural effusions.  IMPRESSION: Improved aeration.  Persistent left lower lobe airspace consolidation and probable small effusion.  Streaky right basilar atelectasis.   Electronically Signed   By: Kalman Jewels M.D.   On: 11/20/2013 08:22   Medications: Infusions: . sodium chloride 10 mL/hr at 11/21/13 0600  . sodium chloride 10 mL (11/14/13  1817)  . amiodarone 30 mg/hr (11/21/13 0600)  . DOPamine 3 mcg/kg/min (11/21/13 0600)  . heparin 1,800 Units/hr (11/21/13 0600)    Scheduled Medications: . sodium chloride   Intravenous Once  . sodium chloride   Intravenous Once  . sodium chloride   Intravenous Once  . acetylcysteine  3 mL Nebulization TID  . bisacodyl  10 mg Oral Daily   Or  . bisacodyl  10 mg Rectal Daily  . cefTAZidime (FORTAZ)  IV  1 g Intravenous Q24H  . darbepoetin (ARANESP) injection - NON-DIALYSIS  100 mcg Subcutaneous Q Wed-1800  . docusate  200 mg Oral Daily  . feeding supplement (GLUCERNA SHAKE)  237 mL Oral Q1500  . furosemide  160 mg Intravenous Q6H  . insulin aspart  0-24 Units Subcutaneous 6 times per day  . lactulose  20 g Oral Daily  . levalbuterol  0.63 mg Nebulization TID  . magic mouthwash  5 mL Oral TID  . metolazone  5 mg Oral Daily  . metoprolol tartrate  12.5 mg Oral BID   Or  . metoprolol tartrate  12.5 mg Per Tube BID  . nystatin cream   Topical TID  . pantoprazole (PROTONIX) IV  40 mg Intravenous Q24H  . potassium chloride  40 mEq Oral Daily  . sodium chloride  10-40 mL Intracatheter Q12H  . Warfarin - Pharmacist Dosing Inpatient   Does not apply q68    Background 80 year old female with multiple medical issues including stage III CKD who is status post CABG and mitral valve replacement with postoperative acute on chronic renal failure (baseline creatinine around 1.5), marked volume overload (50 lb +)  Assessment/Recommendations  Renal- Acute on chronic kidney disease in the setting of above. The insult was most likely hemodynamic in nature/ATN and not unusual in this scenario. UOP has improved  with addition of zaroxolyn to furosemide.  No dialysis indications (but would accept if developed). Cr increased slightly to 2.92 this AM. Still on Lasix 160 mg IV q6h + Metolazone 5 mg qd. Hypertension/volume - Last CVP was 9 on 9/13.  She remains on low dose dopamine. Continue current  diuretic program. Still diuresing slowly.  Anemia - Situational and CKD- continue weekly aranesp. Iron studies still pending. Hypokalemia -  Resolved. On 40 mEq K daily Hyponatremia- Dilutional and most likely due to volume overload. Would anticipate if can maintain current rate of diuresis will slowly trend back up, however, back down to 119 this AM. Part of drips  mixed in D5 - pharmacy to see if can change to NS S/p CABG/MVR AFib on heparin, BB, amio Leukocytosis - new.10K->30 K past 24 hours Primary team assessing  Signed: Luanne Bras, MD 11/21/2013 7:32 AM  I have seen and examined this patient and agree with plan as outlined in the note above by Dr. Ronnald Ramp.  Continues to diurese, creatinine hovering around 3, still with hyponatremia despite diuresis and is getting extra free water from drips (po water restricted). Still 39 lb above pre-op weight.  Has had a worrisome bump in WBC to 30K - cultures and CXR  have been ordered. Source of infection not obvious from hx,physical.  Await studies. Primary service has started empiric ATB's including vanco - need to watch levels closely.  Joran Kallal B,MD 11/21/2013 9:53 AM

## 2013-11-21 NOTE — Progress Notes (Addendum)
[address]       [place] [address]             [phone]      13 Days Post-Op Procedure(s) (LRB): CORONARY ARTERY BYPASS GRAFTING (CABG), on pump, times two, using left internal mammary artery, cryo saphenous vein. (N/A) INTRAOPERATIVE TRANSESOPHAGEAL ECHOCARDIOGRAM (N/A) MITRAL VALVE (MV) REPLACEMENT (N/A)  Subjective:  Ms. Claudia [name] states she is feeling pretty good this morning. Per nursing staff patient had an episode of stool incontinence and there was concern this could possibly be C. Diff.  She has been maintaining NSR for >24 hours.  + ambulation with assistance  Objective: Vital signs in last 24 hours: Temp:  [97.8 F (36.6 C)-99.2 F (37.3 C)] 97.8 F (36.6 C) (09/17 0400) Pulse Rate:  [70-87] 77 (09/17 0700) Cardiac Rhythm:  [-] Normal sinus rhythm (09/17 0600) Resp:  [16-30] 20 (09/17 0700) BP: (97-120)/(38-59) 113/50 mmHg (09/17 0700) SpO2:  [93 %-100 %] 97 % (09/17 0700) Weight:  [230 lb 6.1 oz (104.5 kg)] 230 lb 6.1 oz (104.5 kg) (09/17 0500)  Intake/Output from previous day: 09/16 0701 - 09/17 0700 In: 1895.7 [P.O.:540; I.V.:1073.7; IV Piggyback:282] Out: 2560 [Urine:2560]  General appearance: alert, cooperative and no distress Heart: regular rate and rhythm Lungs: clear to auscultation bilaterally Abdomen: soft, non-tender; bowel sounds normal; no masses,  no organomegaly Extremities: edema 3-4+ Wound: clean, some serous drainage present likely due to edema, staples and sutures remain in place  Lab Results:  Recent Labs  11/19/13 0420 11/21/13 0253  WBC 10.5 30.8*  HGB 9.2* 9.8*  HCT 26.2* 28.2*  PLT 189 156   BMET:  Recent Labs  11/20/13 0411 11/21/13 0253  NA 121* 119*  K 3.8 3.8  CL 78* 75*  CO2 27 27  GLUCOSE 134* 141*  BUN 76* 78*  CREATININE 2.82* 2.92*  CALCIUM 8.5 8.2*    PT/INR:  Recent Labs  11/21/13 0253  LABPROT 16.9*  INR 1.37   ABG    Component Value Date/Time   PHART 7.318* 11/11/2013  1642   HCO3 18.5* 11/11/2013 1642   TCO2 20 11/11/2013 1642   ACIDBASEDEF 7.0* 11/11/2013 1642   O2SAT 60.2 11/21/2013 0420   CBG (last 3)   Recent Labs  11/20/13 1922 11/20/13 2330 11/21/13 0342  GLUCAP 149* 229* 137*    Assessment/Plan: S/P Procedure(s) (LRB): CORONARY ARTERY BYPASS GRAFTING (CABG), on pump, times two, using left internal mammary artery, cryo saphenous vein. (N/A) INTRAOPERATIVE TRANSESOPHAGEAL ECHOCARDIOGRAM (N/A) MITRAL VALVE (MV) REPLACEMENT (N/A)  1. CV- Maintaining NSR- will d/c IV Amiodarone, start oral at 200 mg BID,continue dopamine until renal fx is baseline 2. Pulm- wean oxygen as tolerated, CXR with continued LLL consolidation felt to be atelectasis/effusion vs. Pneumonia- continue aggressive pulm toilet 3. Renal- creatinine trended up slightly this morning to 2.92, significant hypervolemia weight is up 50 lbs, good response to diuresis, Nephrology following 4. ID- patient with leukocytosis this morning of 30,000, low grade fever of 99 in last 24 hours- will send UA, UC, Blood Cultures, start empiric Vanc and Zosyn 5. GI- stool incontinent, C. Diff ordered 6. DM- cbgs remain controlled 7. Dispo- patient making slow progress, new leukocytosis no evidence of wound infection, will work patient up to rule out acute source of infection, continue diuresis, dopamine  LOS: 16 days    BARRETT, ERIN 11/21/2013  Elevated WBC with density LLL c/w pneumonia Will pan culture and start vanc/Zosyn I personally NTS'ed the patient  of bloody secretions  patient examined and medical record reviewed,agree with above note. VAN TRIGT III,PETER 11/21/2013

## 2013-11-21 NOTE — Progress Notes (Signed)
ANTIBIOTIC CONSULT NOTE - INITIAL  Pharmacy Consult for Vancomycin/ Zosyn Indication: pneumonia  / sepsis   Allergies  Allergen Reactions  . Aspirin Other (See Comments)    High doses caused stomach bleeds    Patient Measurements: Height: 5\' 7"  (170.2 cm) Weight: 230 lb 6.1 oz (104.5 kg) IBW/kg (Calculated) : 61.6   Vital Signs: Temp: 97.5 F (36.4 C) (09/17 0800) Temp src: Oral (09/17 0800) BP: 106/50 mmHg (09/17 0800) Pulse Rate: 75 (09/17 0800) Intake/Output from previous day: 09/16 0701 - 09/17 0700 In: 1945.3 [P.O.:540; I.V.:1123.3; IV Piggyback:282] Out: 2560 [Urine:2560] Intake/Output from this shift: Total I/O In: 115.6 [I.V.:49.6; IV Piggyback:66] Out: -   Labs:  Recent Labs  11/19/13 0420 11/20/13 0411 11/21/13 0253  WBC 10.5  --  30.8*  HGB 9.2*  --  9.8*  PLT 189  --  156  CREATININE 3.00* 2.82* 2.92*   Estimated Creatinine Clearance: 19.4 ml/min (by C-G formula based on Cr of 2.92). No results found for this basename: VANCOTROUGH, Corlis Leak, VANCORANDOM, [place], GENTPEAK, GENTRANDOM, TOBRATROUGH, TOBRAPEAK, TOBRARND, AMIKACINPEAK, AMIKACINTROU, AMIKACIN,  in the last 72 hours   Microbiology: Recent Results (from the past 720 hour(s))  MRSA PCR SCREENING     Status: None   Collection Time    11/05/13  1:28 PM      Result Value Ref Range Status   MRSA by PCR NEGATIVE  NEGATIVE Final   Comment:            The GeneXpert MRSA Assay (FDA     approved for NASAL specimens     only), is one component of a     comprehensive MRSA colonization     surveillance program. It is not     intended to diagnose MRSA     infection nor to guide or     monitor treatment for     MRSA infections.  SURGICAL PCR SCREEN     Status: None   Collection Time    11/06/13  8:13 AM      Result Value Ref Range Status   MRSA, PCR NEGATIVE  NEGATIVE Final   Staphylococcus aureus NEGATIVE  NEGATIVE Final   Comment:            The Xpert SA Assay (FDA     approved for  NASAL specimens     in patients over 63 years of age),     is one component of     a comprehensive surveillance     program.  Test performance has     been validated by [employer] for patients greater     than or equal to 26 year old.     It is not intended     to diagnose infection nor to     guide or monitor treatment.  CULTURE, BLOOD (SINGLE)     Status: None   Collection Time    11/16/13  9:15 AM      Result Value Ref Range Status   Specimen Description BLOOD LEFT ARM   Final   Special Requests BOTTLES DRAWN AEROBIC ONLY 4CC   Final   Culture  Setup Time     Final   Value: 11/16/2013 15:20     Performed at [facility]   Culture     Final   Value:        BLOOD CULTURE RECEIVED NO GROWTH TO DATE CULTURE WILL BE HELD FOR 5 DAYS BEFORE ISSUING A  FINAL NEGATIVE REPORT     Performed at [facility]   Report Status PENDING   Incomplete  URINE CULTURE     Status: None   Collection Time    11/16/13  9:36 AM      Result Value Ref Range Status   Specimen Description URINE, CATHETERIZED   Final   Special Requests Normal   Final   Culture  Setup Time     Final   Value: 11/16/2013 19:34     Performed at [place]     Final   Value: NO GROWTH     Performed at [facility]   Culture     Final   Value: NO GROWTH     Performed at [facility]   Report Status 11/17/2013 FINAL   Final    Medical History: Past Medical History  Diagnosis Date  . GERD (gastroesophageal reflux disease)   . PUD (peptic ulcer disease)   . Anemia   . Hypertension   . S/P endoscopy Aug 2011    3 superficial gastric ulcers, NSAID-induced  . S/P colonoscopy Sept 2011    left-sided diverticula, tubular adenoma  . Coronary artery disease   . Shortness of breath   . Diabetes mellitus     insulin dependent  . Headache(784.0)     rare migraines  . Cancer     hx of skin cancer  . Arthritis   . Osteopenia   . Hypercholesterolemia   . SIADH  (syndrome of inappropriate ADH production)   . CHF (congestive heart failure)   . Myocardial infarction 2013    Medications:  Scheduled:  . sodium chloride   Intravenous Once  . sodium chloride   Intravenous Once  . sodium chloride   Intravenous Once  . acetylcysteine  3 mL Nebulization TID  . amiodarone  200 mg Oral BID  . bisacodyl  10 mg Oral Daily   Or  . bisacodyl  10 mg Rectal Daily  . darbepoetin (ARANESP) injection - NON-DIALYSIS  100 mcg Subcutaneous Q Wed-1800  . docusate  200 mg Oral Daily  . feeding supplement (GLUCERNA SHAKE)  237 mL Oral Q1500  . fluconazole (DIFLUCAN) IV  100 mg Intravenous Q24H  . furosemide  160 mg Intravenous Q6H  . insulin aspart  0-24 Units Subcutaneous 6 times per day  . lactulose  20 g Oral Daily  . levalbuterol  0.63 mg Nebulization TID  . magic mouthwash  5 mL Oral TID  . metolazone  5 mg Oral Daily  . metoprolol tartrate  12.5 mg Oral BID   Or  . metoprolol tartrate  12.5 mg Per Tube BID  . nystatin cream   Topical TID  . pantoprazole (PROTONIX) IV  40 mg Intravenous Q24H  . potassium chloride  40 mEq Oral Daily  . sodium chloride  10-40 mL Intracatheter Q12H  . warfarin  5 mg Oral q1800  . Warfarin - Pharmacist Dosing Inpatient   Does not apply q1800  . Warfarin - Physician Dosing Inpatient   Does not apply q1800   Assessment: 80 y.o female. POD# 13 s/p CABG/MVR. Patient with leukocytosis this morning of 30,000, low grade fever of 99 in last 24 hours. Starting empiric Vanc and Zosyn.  SCr trended up slightly this morning to 2.92. MD notes significant hypervolemia weight is up 50 lbs, good response to diuresis, Nephrology following. Estimated CrCl ~ 19 ml/min.  UA, UC, Blood Cultures pending.  Goal of Therapy:  Vancomycin trough level 15-20 mcg/ml  Plan:  Vancomycin 1500 mg IV q48h Zosyn 2.25 gm IV q6h Follow up  UA, UC, Blood Cultures Check vancomycin trough at steady state.   Nicole Cella, RPh Clinical Pharmacist Pager:  [phone]  11/21/2013,8:48 AM

## 2013-11-21 NOTE — Progress Notes (Signed)
[facility] BRIEF SICU PROGRESS NOTE  13 Days Post-Op  S/P Procedure(s) (LRB): CORONARY ARTERY BYPASS GRAFTING (CABG), on pump, times two, using left internal mammary artery, cryo saphenous vein. (N/A) INTRAOPERATIVE TRANSESOPHAGEAL ECHOCARDIOGRAM (N/A) MITRAL VALVE (MV) REPLACEMENT (N/A)   Stable day NSR w/ stable BP, no fevers O2 sats 96-98% on 2 L/min UOP improved  Plan: Continue current plan  Claudia [name] H 11/21/2013 7:07 PM

## 2013-11-21 NOTE — Progress Notes (Signed)
ANTICOAGULATION CONSULT NOTE - Follow Up Consult  Pharmacy Consult for heparin Indication: atrial fibrillation  Labs:  Recent Labs  11/19/13 0420  11/19/13 0600 11/20/13 0411 11/20/13 0740 11/20/13 1927 11/21/13 0253  HGB 9.2*  --   --   --   --   --  9.8*  HCT 26.2*  --   --   --   --   --  28.2*  PLT 189  --   --   --   --   --  156  LABPROT  --   --  15.2 15.8*  --   --  16.9*  INR  --   --  1.20 1.26  --   --  1.37  HEPARINUNFRC  --   < > 0.31  --  0.12* 0.47 0.18*  CREATININE 3.00*  --   --  2.82*  --   --  2.92*  < > = values in this interval not displayed.  Assessment: Heparin drip was stopped at 09:30 this AM per MD's orders due to patient to go to IR for central line placement in this  80yo female.  Heparin level was subtherapeutic early this AM and heparin rate was increased to 1800 units/hr. Now s/p central line placed. Dr. Lucianne Lei Trigt's orders are to resume heparin drip 2 hrs after IR procedure. No bleeding noted.  Low tight goal per Dr Prescott Gum.  Goal of Therapy:  Heparin level ~0.3 units/ml   Plan:  2 hours after IR procedure for central line placement today, resume heparin gtt at previous rate of 1800 units/hr. Check level in 8hr. Daily heparin level and CBC  Nicole Cella, RPh Clinical Pharmacist Pager: [phone]   11/21/2013,3:47 PM

## 2013-11-21 NOTE — Progress Notes (Signed)
ANTICOAGULATION CONSULT NOTE - Follow Up Consult  Pharmacy Consult for heparin Indication: atrial fibrillation  Labs:  Recent Labs  11/18/13 0430 11/19/13 0420 11/19/13 0600 11/20/13 0411 11/20/13 0740 11/20/13 1927 11/21/13 0253  HGB 9.2* 9.2*  --   --   --   --  9.8*  HCT 25.8* 26.2*  --   --   --   --  28.2*  PLT 180 189  --   --   --   --  156  LABPROT 14.6  --  15.2 15.8*  --   --  16.9*  INR 1.14  --  1.20 1.26  --   --  1.37  HEPARINUNFRC 0.28*  --  0.31  --  0.12* 0.47 0.18*  CREATININE 3.01* 3.00*  --  2.82*  --   --   --     Assessment: 80yo female now subtherapeutic on heparin after rate decrease, low tight goal per Dr Prescott Gum.  Goal of Therapy:  Heparin level ~0.3 units/ml   Plan:  Will increase heparin gtt slightly to 1800 units/hr and check level in [place], PharmD, BCPS  11/21/2013,3:39 AM

## 2013-11-21 NOTE — Progress Notes (Signed)
Physical Therapy Treatment Patient Details Name: Claudia [name] MRN: [id number] DOB: 02-Sep-1931 Today's Date: 11/21/2013    History of Present Illness Pt adm with chest pain and underwent urgent CABG x 2 and MVR on 11/09/13. PMH - HTN, MI, DM    PT Comments    Pt continues to have very poor mobilty. Remains weak and with significant fluid on legs.  Follow Up Recommendations  SNF     Equipment Recommendations  None recommended by PT    Recommendations for Other Services OT consult     Precautions / Restrictions Precautions Precautions: Fall;Sternal    Mobility  Bed Mobility Overal bed mobility: Needs Assistance Bed Mobility: Sit to Supine       Sit to supine: +2 for physical assistance;Max assist   General bed mobility comments: Assist to control descent of trunk and bring legs up into bed.  Transfers Overall transfer level: Needs assistance Equipment used: Rolling walker (2 wheeled) Transfers: Sit to/from [facility] Sit to Stand: +2 physical assistance;Max assist Stand pivot transfers: +2 physical assistance;Mod assist       General transfer comment: Heavy assist to bring hips up. Used chair pad under hips to bring hips up. Pt with heavy posterior lean. Used Stedy for pivot from chair to bed.  Ambulation/Gait Ambulation/Gait assistance: +2 physical assistance;Mod assist Ambulation Distance (Feet): 6 Feet Assistive device: Rolling walker (2 wheeled) Gait Pattern/deviations: Step-through pattern;Decreased step length - right;Decreased step length - left;Shuffle;Trunk flexed Gait velocity: very slow Gait velocity interpretation: Below normal speed for age/gender General Gait Details: Verbal cues to stand more erect and incr step length.   Stairs            Wheelchair Mobility    Modified Rankin (Stroke Patients Only)       Balance Overall balance assessment: Needs assistance Sitting-balance support: No upper extremity  supported;Feet supported Sitting balance-Leahy Scale: Fair     Standing balance support: Bilateral upper extremity supported Standing balance-Leahy Scale: Poor Standing balance comment: Use of walker and mod A to stand due to posterior lean.                    Cognition Arousal/Alertness: Awake/alert Behavior During Therapy: WFL for tasks assessed/performed Overall Cognitive Status: Within Functional Limits for tasks assessed                      Exercises      General Comments        Pertinent Vitals/Pain Pain Assessment: Faces Faces Pain Scale: Hurts little more Pain Location: sternum Pain Intervention(s): Limited activity within patient's tolerance;Repositioned;Patient requesting pain meds-RN notified    Home Living                      Prior Function            PT Goals (current goals can now be found in the care plan section) Acute Rehab PT Goals Patient Stated Goal: Return home and see Cassie (grandaughter) PT Goal Formulation: With patient Time For Goal Achievement: 12/05/13 Potential to Achieve Goals: Fair Progress towards PT goals: Goals downgraded-see care plan    Frequency  Min 3X/week    PT Plan Current plan remains appropriate    Co-evaluation             End of Session Equipment Utilized During Treatment: Oxygen Activity Tolerance: Patient limited by fatigue Patient left: in chair;with call bell/phone within reach  Time: 1115-5208 PT Time Calculation (min): 28 min  Charges:  $Gait Training: 23-37 mins                    G Codes:      Maclovia Uher 2013-11-29, 10:29 AM  Suanne Marker PT [phone]

## 2013-11-21 NOTE — Procedures (Signed)
Procedure: Right IJ  Central line placement with US/Fluoro guidance Triple lumen via right IJ.  No complications No significant blood loss.  Tip at the cavoatrial junction. Ready to use. Signed,  Johny Shears, DO

## 2013-11-21 NOTE — Progress Notes (Signed)
Small yellow round area surrounded by small area of redness noted around right subclavian central line site when RN removed dressing to discontinue the line.  Central line removed per protocol.   Dr. Prescott Gum made aware; came to bedside to assess.  Applied silver nitrate to site and ordered to apply neosporin to site and cover with 2x2 gauze.  Awaiting neosporin from pharmacy.  Covered with gauze until neosporin received.  Will continue to monitor.

## 2013-11-22 ENCOUNTER — Inpatient Hospital Stay (HOSPITAL_COMMUNITY): Payer: Medicare HMO

## 2013-11-22 LAB — BASIC METABOLIC PANEL
Anion gap: 17 — ABNORMAL HIGH (ref 5–15)
BUN: 80 mg/dL — ABNORMAL HIGH (ref 6–23)
CO2: 28 mEq/L (ref 19–32)
Calcium: 8.3 mg/dL — ABNORMAL LOW (ref 8.4–10.5)
Chloride: 75 mEq/L — ABNORMAL LOW (ref 96–112)
Creatinine, Ser: 2.89 mg/dL — ABNORMAL HIGH (ref 0.50–1.10)
GFR calc Af Amer: 17 mL/min — ABNORMAL LOW (ref >90)
GFR calc non Af Amer: 14 mL/min — ABNORMAL LOW (ref >90)
Glucose, Bld: 124 mg/dL — ABNORMAL HIGH (ref 70–99)
Potassium: 3.8 mEq/L (ref 3.7–5.3)
Sodium: 120 mEq/L — CL (ref 137–147)

## 2013-11-22 LAB — GLUCOSE, CAPILLARY
Glucose-Capillary: 121 mg/dL — ABNORMAL HIGH (ref 70–99)
Glucose-Capillary: 123 mg/dL — ABNORMAL HIGH (ref 70–99)
Glucose-Capillary: 135 mg/dL — ABNORMAL HIGH (ref 70–99)
Glucose-Capillary: 157 mg/dL — ABNORMAL HIGH (ref 70–99)
Glucose-Capillary: 163 mg/dL — ABNORMAL HIGH (ref 70–99)

## 2013-11-22 LAB — CBC
HCT: 27.6 % — ABNORMAL LOW (ref 36.0–46.0)
Hemoglobin: 9.5 g/dL — ABNORMAL LOW (ref 12.0–15.0)
MCH: 29.1 pg (ref 26.0–34.0)
MCHC: 34.4 g/dL (ref 30.0–36.0)
MCV: 84.7 fL (ref 78.0–100.0)
Platelets: 140 10*3/uL — ABNORMAL LOW (ref 150–400)
RBC: 3.26 MIL/uL — ABNORMAL LOW (ref 3.87–5.11)
RDW: 15.9 % — ABNORMAL HIGH (ref 11.5–15.5)
WBC: 22.3 10*3/uL — ABNORMAL HIGH (ref 4.0–10.5)

## 2013-11-22 LAB — PROTIME-INR
INR: 1.62 — ABNORMAL HIGH (ref 0.00–1.49)
Prothrombin Time: 19.2 seconds — ABNORMAL HIGH (ref 11.6–15.2)

## 2013-11-22 LAB — CULTURE, BLOOD (SINGLE): Culture: NO GROWTH

## 2013-11-22 LAB — CLOSTRIDIUM DIFFICILE BY PCR: Toxigenic C. Difficile by PCR: NEGATIVE

## 2013-11-22 LAB — HEPARIN LEVEL (UNFRACTIONATED)
Heparin Unfractionated: 0.25 IU/mL — ABNORMAL LOW (ref 0.30–0.70)
Heparin Unfractionated: 0.26 IU/mL — ABNORMAL LOW (ref 0.30–0.70)

## 2013-11-22 MED ORDER — HEPARIN (PORCINE) IN NACL 100-0.45 UNIT/ML-% IJ SOLN
1850.0000 [IU]/h | INTRAMUSCULAR | Status: DC
  Filled 2013-11-22 (×2): qty 250

## 2013-11-22 MED ORDER — LIDOCAINE HCL (PF) 1 % IJ SOLN
INTRAMUSCULAR | Status: AC
  Administered 2013-11-22: 11:00:00
  Filled 2013-11-22: qty 10

## 2013-11-22 MED ORDER — HEPARIN (PORCINE) IN NACL 100-0.45 UNIT/ML-% IJ SOLN
1950.0000 [IU]/h | INTRAMUSCULAR | Status: DC
  Administered 2013-11-23 – 2013-11-25 (×3): 1950 [IU]/h via INTRAVENOUS
  Filled 2013-11-22 (×7): qty 250

## 2013-11-22 MED ORDER — AMIODARONE HCL IN DEXTROSE 360-4.14 MG/200ML-% IV SOLN
30.0000 mg/h | INTRAVENOUS | Status: DC
  Administered 2013-11-22 – 2013-11-25 (×5): 30 mg/h via INTRAVENOUS
  Filled 2013-11-22 (×14): qty 200

## 2013-11-22 NOTE — Progress Notes (Signed)
ANTICOAGULATION CONSULT NOTE - Follow Up Consult  Pharmacy Consult for Heparin  Indication: atrial fibrillation  Allergies  Allergen Reactions  . Aspirin Other (See Comments)    High doses caused stomach bleeds    Patient Measurements: Height: 5\' 7"  (170.2 cm) Weight: 230 lb 6.1 oz (104.5 kg) IBW/kg (Calculated) : 61.6  Vital Signs: Temp: 97.8 F (36.6 C) (09/18 0000) Temp src: Oral (09/18 0000) BP: 105/52 mmHg (09/18 0100) Pulse Rate: 74 (09/18 0100)  Labs:  Recent Labs  11/19/13 0420 11/19/13 0600 11/20/13 0411  11/20/13 1927 11/21/13 0253 11/22/13 0017  HGB 9.2*  --   --   --   --  9.8*  --   HCT 26.2*  --   --   --   --  28.2*  --   PLT 189  --   --   --   --  156  --   LABPROT  --  15.2 15.8*  --   --  16.9*  --   INR  --  1.20 1.26  --   --  1.37  --   HEPARINUNFRC  --  0.31  --   < > 0.47 0.18* 0.26*  CREATININE 3.00*  --  2.82*  --   --  2.92*  --   < > = values in this interval not displayed.  Estimated Creatinine Clearance: 19.4 ml/min (by C-G formula based on Cr of 2.92).     Assessment: Low heparin level after re-starting heparin after IR procedure, other labs as above.   Goal of Therapy:  Heparin level 0.3-0.4 units/ml, low goal per Dr. Prescott Gum Monitor platelets by anticoagulation protocol: Yes   Plan:  -Increase heparin slightly to 1850 units/hr given low goal -1000 HL -Daily CBC/HL -Monitor for bleeding  Narda Bonds 11/22/2013,2:08 AM

## 2013-11-22 NOTE — Progress Notes (Signed)
ANTICOAGULATION CONSULT NOTE - Follow Up Consult  Pharmacy Consult for Heparin Indication: atrial fibrillation  Allergies  Allergen Reactions  . Aspirin Other (See Comments)    High doses caused stomach bleeds    Patient Measurements: Height: 5\' 7"  (170.2 cm) Weight: 227 lb 1.2 oz (103 kg) IBW/kg (Calculated) : 61.6 Heparin Dosing Weight: 85 kg  Vital Signs: Temp: 97.5 F (36.4 C) (09/18 1556) Temp src: Oral (09/18 1556) BP: 99/65 mmHg (09/18 1500) Pulse Rate: 98 (09/18 1500)  Labs:  Recent Labs  11/20/13 0411  11/21/13 0253 11/22/13 0017 11/22/13 0313  HGB  --   --  9.8*  --  9.5*  HCT  --   --  28.2*  --  27.6*  PLT  --   --  156  --  140*  LABPROT 15.8*  --  16.9*  --  19.2*  INR 1.26  --  1.37  --  1.62*  HEPARINUNFRC  --   < > 0.18* 0.26* 0.25*  CREATININE 2.82*  --  2.92*  --  2.89*  < > = values in this interval not displayed.  Estimated Creatinine Clearance: 19.5 ml/min (by C-G formula based on Cr of 2.89).   Medications:  Heparin previously infusing at 1850 units/hr  Assessment: 80 yo F continues on heparin for afib.  Pt is s/p CABG/MVR 9/5 and has been started on Coumadin dosing per MD.  Heparin was held this morning for thoracentesis and was just restarted a few minutes ago.  Thoracentesis today yielded 500cc blood tinged fluid.  Hgb remains stable post-op.    Goal of Therapy:  Heparin level 0.3-0.4 units/ml (per Dr. Darcey Nora) Monitor platelets by anticoagulation protocol: Yes   Plan:  Restart heparin at 1850 units/hr (done at 1645) Heparin level in 8 hours. Continue Heparin level and CBC daily.  [employer], Pharm.D., BCPS Clinical Pharmacist Pager [phone] 11/22/2013 4:54 PM

## 2013-11-22 NOTE — Progress Notes (Signed)
   In and out of a fib today, back on amiodarone Very deconditioned BP 99/65  Pulse 98  Temp(Src) 97.5 F (36.4 C) (Oral)  Resp 15  Ht 5\' 7"  (1.702 m)  Wt 227 lb 1.2 oz (103 kg)  BMI 35.56 kg/m2  SpO2 99%   Intake/Output Summary (Last 24 hours) at 11/22/13 1718 Last data filed at 11/22/13 1500  Gross per 24 hour  Intake 1374.16 ml  Output   3175 ml  Net -1800.84 ml    Continue current therapy

## 2013-11-22 NOTE — Progress Notes (Signed)
CRITICAL VALUE ALERT  Critical value received:  Sodium 120  Date of notification:  11/22/2013  Time of notification:  0092  Critical value read back:Yes.    Nurse who received alert:  Tommy Rainwater  MD notified (1st page):  Consistent with previous results, will address in am rounds.

## 2013-11-22 NOTE — Progress Notes (Signed)
Subjective:   Patient seen this AM, says she is feeling better today. Says her breathing is somewhat improved, but still "labored". Claims she did have some diarrhea overnight. No fever, chills, nausea, or vomiting. Mild abdominal tenderness on exam.  Weight decreased 1.5 kg in 24 hours. Output of 2.8L w/ net -1.2L since yesterday. WBC's improved to 22. On Vanc/Zosyn C. Diff PCR and cultures still pending  Objective Vital signs in last 24 hours: Filed Vitals:   11/22/13 0400 11/22/13 0500 11/22/13 0600 11/22/13 0700  BP: 94/63 109/54 99/54 98/60   Pulse: 101 104 88 91  Temp:      TempSrc:      Resp: 17 21 19 19   Height:      Weight:  227 lb 1.2 oz (103 kg)    SpO2: 94% 93% 95% 95%   Weight change: -3 lb 4.9 oz (-1.5 kg)  Intake/Output Summary (Last 24 hours) at 11/22/13 0710 Last data filed at 11/22/13 0600  Gross per 24 hour  Intake 1578.15 ml  Output   2825 ml  Net -1246.85 ml   Weight Trending 11/22/13 0500 103 kg (227 lb 1.2 oz) 11/21/13 0500 104.5 kg (230 lb 6.1 oz) 11/20/13 0500 105.7 kg (233 lb 0.4 oz 11/19/13 106.4 kg (234 lb 9.1 oz)  (? Validity) 11/18/13 0500 103 kg (227 lb 1.2 oz) 11/17/13 0500 104.9 kg (231 lb 4.2 oz) 11/16/13 0630 110.1 kg (242 lb 11.6 oz)   11/15/13 0500 110.2 kg (242 lb 15.2 oz)  11/14/13 0645 110 kg (242 lb 8.1 oz) 11/13/13 0500 107.8 kg (237 lb 10.5 oz)  11/12/13 0500 105.3 kg (232 lb 2.3 oz)  11/11/13 0530 101.5 kg (223 lb 12.3 oz)  11/10/13 0630 104 kg (229 lb 4.5 oz)  11/09/13 0200 98.4 kg (216 lb 14.9 oz) 11/08/13 1924 86.7 kg (191 lb 2.2 oz) 11/05/13 1330 86.7 kg (191 lb                      Pre op weight  Physical Exam:  General: Obese female, alert, cooperative, NAD. HEENT: PERRL, EOMI. Moist mucus membranes Neck: Full range of motion without pain, supple, no lymphadenopathy or carotid bruits Lungs: Clear to ascultation bilaterally, normal work of respiration, no wheezes, rales, rhonchi Heart: RRR, no murmurs, gallops, or  rubs Abdomen: Soft, non-tender, non-distended, BS + Extremities: +3 pitting edema extending to the hip Neurologic: Alert & oriented X3, cranial nerves II-XII intact, strength grossly intact, sensation intact to light touch  Labs: Basic Metabolic Panel:  Recent Labs Lab 11/20/13 0411 11/21/13 0253 11/22/13 0313  NA 121* 119* 120*  K 3.8 3.8 3.8  CL 78* 75* 75*  CO2 27 27 28   GLUCOSE 134* 141* 124*  BUN 76* 78* 80*  CREATININE 2.82* 2.92* 2.89*  CALCIUM 8.5 8.2* 8.3*  PHOS 5.3*  --   --      Recent Labs Lab 11/19/13 0420 11/20/13 0411 11/21/13 0253  AST 27 13 26   ALT 28 23 31   ALKPHOS 88 79 113  BILITOT 0.6 0.6 0.6  PROT 5.6* 5.7* 5.6*  ALBUMIN 2.4* 2.4* 2.3*    Recent Labs Lab 11/17/13 0418 11/18/13 0430 11/19/13 0420 11/21/13 0253 11/22/13 0313  WBC 9.6 9.9 10.5 30.8* 22.3*  HGB 9.1* 9.2* 9.2* 9.8* 9.5*  HCT 25.9* 25.8* 26.2* 28.2* 27.6*  MCV 83.8 84.3 84.0 84.7 84.7  PLT 182 180 189 156 140*    Recent Labs Lab 11/21/13 1208 11/21/13 1649 11/21/13 1931  11/21/13 2346 11/22/13 0334  GLUCAP 140* 171* 162* 163* 121*   Studies/Results: Ir Fluoro Guide Cv Line Right  11/21/2013   CLINICAL DATA:  80 year old female with multiple medical problems. Currently admitted for cardiac issues. She has been referred for central line placement.  EXAM: Right internal jugular central line placement WITH ULTRASOUND AND FLUOROSCOPIC GUIDANCE  No sedation  FLUOROSCOPY TIME:  Twelve seconds  PROCEDURE: The patient was advised of the possible risks and complications and agreed to undergo the procedure. The patient was then brought to the angiographic suite for the procedure.  The right neck was prepped with chlorhexidine, drapedin the usual sterile fashion using maximum barrier technique (cap and mask, sterile gown, sterile gloves, large sterile sheet, hand hygiene and cutaneous antisepsis) and infiltrated locally with 1% Lidocaine.  Ultrasound demonstrated patency of the right  internal jugular vein, and this was documented with an image. Under real-time ultrasound guidance, this vein was accessed with a 21 gauge micropuncture needle and image documentation was performed. A 0.018 wire was introduced in to the vein.  A triple-lumen 5 French power PICC was cut at 17 cm. Over the 0.018 wire, a peel-away sheath was advanced into the right atrium. A 5 French triple lumen power injectable catheter was advanced to the lower SVC/right atrial junction. Fluoroscopy during the procedure and fluoro spot radiograph confirms appropriate catheter position. The catheter was flushed and covered with asterile dressing.  Catheter length: 17 cm  Complications: None  IMPRESSION: Status post right internal jugular vein triple-lumen catheter placement. The tip is at the cavoatrial junction and adequate for use.  Signed,  Dulcy Fanny. Earleen Newport, DO  [facility]  [facility]   Electronically Signed   By: Corrie Mckusick O.D.   On: 11/21/2013 17:42   Ir US Guide Vasc Access Right  11/21/2013   CLINICAL DATA:  80 year old female with multiple medical problems. Currently admitted for cardiac issues. She has been referred for central line placement.  EXAM: Right internal jugular central line placement WITH ULTRASOUND AND FLUOROSCOPIC GUIDANCE  No sedation  FLUOROSCOPY TIME:  Twelve seconds  PROCEDURE: The patient was advised of the possible risks and complications and agreed to undergo the procedure. The patient was then brought to the angiographic suite for the procedure.  The right neck was prepped with chlorhexidine, drapedin the usual sterile fashion using maximum barrier technique (cap and mask, sterile gown, sterile gloves, large sterile sheet, hand hygiene and cutaneous antisepsis) and infiltrated locally with 1% Lidocaine.  Ultrasound demonstrated patency of the right internal jugular vein, and this was documented with an image. Under real-time ultrasound guidance, this  vein was accessed with a 21 gauge micropuncture needle and image documentation was performed. A 0.018 wire was introduced in to the vein.  A triple-lumen 5 French power PICC was cut at 17 cm. Over the 0.018 wire, a peel-away sheath was advanced into the right atrium. A 5 French triple lumen power injectable catheter was advanced to the lower SVC/right atrial junction. Fluoroscopy during the procedure and fluoro spot radiograph confirms appropriate catheter position. The catheter was flushed and covered with asterile dressing.  Catheter length: 17 cm  Complications: None  IMPRESSION: Status post right internal jugular vein triple-lumen catheter placement. The tip is at the cavoatrial junction and adequate for use.  Signed,  Dulcy Fanny. Earleen Newport, DO  [facility]  [facility]   Electronically Signed   By: Corrie Mckusick O.D.   On: 11/21/2013 17:42  Dg Chest Port 1 View  11/21/2013   CLINICAL DATA:  80 year old female with elevated white count.  EXAM: PORTABLE CHEST - 1 VIEW  COMPARISON:  11/21/2013 and multiple prior chest radiographs.  FINDINGS: A right IJ central venous catheter is again noted with tip overlying the superior cavoatrial junction.  Cardiomegaly and cardiac valve replacement changes again noted.  A right subclavian central venous catheter has been removed.  There is no evidence of pneumothorax.  Bilateral lower lung consolidations/ atelectasis again identified, left greater than right, with pulmonary vascular congestion and possible interstitial pulmonary edema.  No acute bony abnormalities are identified.  IMPRESSION: Right IJ central venous catheter placement with tip overlying the superior cavoatrial junction. No evidence of pneumothorax.  Unchanged bilateral lower lung atelectasis/ consolidation, left-greater-than-right, with persistent pulmonary vascular congestion and possible interstitial pulmonary edema.   Electronically Signed   By: Hassan Rowan M.D.    On: 11/21/2013 17:09   Dg Chest Port 1 View  11/21/2013   CLINICAL DATA:  Feeling better  EXAM: PORTABLE CHEST - 1 VIEW  COMPARISON:  11/20/2013  FINDINGS: Persistent left lower lobe consolidation. Bilateral interstitial and alveolar airspace opacities. No right pleural effusion. Small left pleural effusion. No pneumothorax stable cardiomediastinal silhouette. Prior valvuloplasty and median sternotomy. Right subclavian central venous catheter with the tip projecting over the cavoatrial junction. Unremarkable osseous structures.  IMPRESSION: Persistent left lower lobe consolidation which may reflect effusion and atelectasis versus pneumonia. There is bilateral interstitial and alveolar airspace opacities concerning for mild underlying pulmonary edema.   Electronically Signed   By: Kathreen Devoid   On: 11/21/2013 08:08   Medications: Infusions: . sodium chloride 10 mL/hr at 11/21/13 0600  . sodium chloride 250 mL (11/21/13 1541)  . DOPamine 3 mcg/kg/min (11/21/13 1530)  . heparin 1,850 Units/hr (11/22/13 0533)    Scheduled Medications: . sodium chloride   Intravenous Once  . sodium chloride   Intravenous Once  . sodium chloride   Intravenous Once  . acetylcysteine  3 mL Nebulization TID  . amiodarone  200 mg Oral BID  . bisacodyl  10 mg Oral Daily   Or  . bisacodyl  10 mg Rectal Daily  . darbepoetin (ARANESP) injection - NON-DIALYSIS  100 mcg Subcutaneous Q Wed-1800  . docusate  200 mg Oral Daily  . feeding supplement (GLUCERNA SHAKE)  237 mL Oral Q1500  . fluconazole (DIFLUCAN) IV  100 mg Intravenous Q24H  . furosemide  160 mg Intravenous Q6H  . insulin aspart  0-24 Units Subcutaneous 6 times per day  . lactulose  20 g Oral Daily  . levalbuterol  0.63 mg Nebulization TID  . magic mouthwash  5 mL Oral TID  . metolazone  5 mg Oral Daily  . metoprolol tartrate  12.5 mg Oral BID   Or  . metoprolol tartrate  12.5 mg Per Tube BID  . neomycin-bacitracin-polymyxin   Topical Daily  . nystatin  cream   Topical TID  . pantoprazole (PROTONIX) IV  40 mg Intravenous Q24H  . piperacillin-tazobactam (ZOSYN)  IV  2.25 g Intravenous 4 times per day  . potassium chloride  40 mEq Oral Daily  . sodium chloride  10-40 mL Intracatheter Q12H  . vancomycin  1,500 mg Intravenous Q48H  . warfarin  5 mg Oral q1800  . Warfarin - Physician Dosing Inpatient   Does not apply q60    Background 80 year old female with multiple medical issues including stage III CKD who is status post CABG and mitral  valve replacement with postoperative acute on chronic renal failure (baseline creatinine around 1.5), marked volume overload (50 lb +)  Assessment/Recommendations  Renal- Acute on chronic kidney disease in the setting of above. The insult was most likely hemodynamic in nature/ATN and not unusual in this scenario. Cr generally unchanged from yesterday. Still on Lasix 160 mg IV q6h + Metolazone 5 mg qd. Output ~3L in past 24 hours, down 1.5 kg since yesterday.  Hypertension/volume - Last CVP was 9 on 9/13.  She remains on low dose dopamine. Continue current diuretic program. Still diuresing slowly.  Anemia - Situational and CKD- continue weekly aranesp. Iron studies from 9/17 w/ iron of 19, ferritin of 604 and Tsat of 7%. Will consider iron load after infection issues addressed.  Hypokalemia -  Resolved. On 40 mEq K daily Hyponatremia- Dilutional and most likely due to volume overload. Slight improvement to 120 this AM.  S/p CABG/MVR AFib on heparin, BB, amio Leukocytosis - Increased to 30.8 yesterday, improved to 22.3. Cultures, C. Diff still pending. On Vanc/Zosyn currently.   Signed: Luanne Bras, MD 11/22/2013 7:10 AM  I have seen and examined this patient and agree with plan as outlined in the above note. Renal function stable, continues to diurese slowly.  Nini Cavan B,MD 11/22/2013 10:30 AM

## 2013-11-22 NOTE — Procedures (Signed)
Successful US guided right thoracentesis. Yielded 500 cc of blood tinged fluid. Pt tolerated procedure well. No immediate complications.  Specimen was sent for labs. CXR ordered.  Tsosie Billing D PA-C 11/22/2013 11:09 AM

## 2013-11-23 ENCOUNTER — Inpatient Hospital Stay (HOSPITAL_COMMUNITY): Payer: Medicare HMO

## 2013-11-23 LAB — CBC
HCT: 26.3 % — ABNORMAL LOW (ref 36.0–46.0)
Hemoglobin: 9.2 g/dL — ABNORMAL LOW (ref 12.0–15.0)
MCH: 29.4 pg (ref 26.0–34.0)
MCHC: 35 g/dL (ref 30.0–36.0)
MCV: 84 fL (ref 78.0–100.0)
Platelets: 153 10*3/uL (ref 150–400)
RBC: 3.13 MIL/uL — ABNORMAL LOW (ref 3.87–5.11)
RDW: 15.9 % — ABNORMAL HIGH (ref 11.5–15.5)
WBC: 14.5 10*3/uL — ABNORMAL HIGH (ref 4.0–10.5)

## 2013-11-23 LAB — GLUCOSE, CAPILLARY
GLUCOSE-CAPILLARY: 173 mg/dL — AB (ref 70–99)
GLUCOSE-CAPILLARY: 95 mg/dL (ref 70–99)
Glucose-Capillary: 191 mg/dL — ABNORMAL HIGH (ref 70–99)
Glucose-Capillary: 171 mg/dL — ABNORMAL HIGH (ref 70–99)

## 2013-11-23 LAB — PROTIME-INR
INR: 1.63 — ABNORMAL HIGH (ref 0.00–1.49)
Prothrombin Time: 19.3 seconds — ABNORMAL HIGH (ref 11.6–15.2)

## 2013-11-23 LAB — BASIC METABOLIC PANEL
Anion gap: 15 (ref 5–15)
BUN: 78 mg/dL — ABNORMAL HIGH (ref 6–23)
CO2: 30 mEq/L (ref 19–32)
Calcium: 8.4 mg/dL (ref 8.4–10.5)
Chloride: 77 mEq/L — ABNORMAL LOW (ref 96–112)
Creatinine, Ser: 2.79 mg/dL — ABNORMAL HIGH (ref 0.50–1.10)
GFR calc Af Amer: 17 mL/min — ABNORMAL LOW (ref >90)
GFR calc non Af Amer: 15 mL/min — ABNORMAL LOW (ref >90)
Glucose, Bld: 117 mg/dL — ABNORMAL HIGH (ref 70–99)
Potassium: 3.6 mEq/L — ABNORMAL LOW (ref 3.7–5.3)
Sodium: 122 mEq/L — ABNORMAL LOW (ref 137–147)

## 2013-11-23 LAB — URINE CULTURE
Colony Count: 9000
Special Requests: NORMAL

## 2013-11-23 LAB — HEPARIN LEVEL (UNFRACTIONATED)
Heparin Unfractionated: 0.11 IU/mL — ABNORMAL LOW (ref 0.30–0.70)
Heparin Unfractionated: 0.36 IU/mL (ref 0.30–0.70)

## 2013-11-23 MED ORDER — PANTOPRAZOLE SODIUM 40 MG PO TBEC
40.0000 mg | DELAYED_RELEASE_TABLET | Freq: Every day | ORAL | Status: DC
  Administered 2013-11-24 – 2013-12-13 (×19): 40 mg via ORAL
  Filled 2013-11-23 (×18): qty 1

## 2013-11-23 MED ORDER — LEVALBUTEROL HCL 0.63 MG/3ML IN NEBU: 0.6300 mg | INHALATION_SOLUTION | Freq: Four times a day (QID) | RESPIRATORY_TRACT | Status: DC | PRN

## 2013-11-23 NOTE — Progress Notes (Signed)
15 Days Post-Op Procedure(s) (LRB): CORONARY ARTERY BYPASS GRAFTING (CABG), on pump, times two, using left internal mammary artery, cryo saphenous vein. (N/A) INTRAOPERATIVE TRANSESOPHAGEAL ECHOCARDIOGRAM (N/A) MITRAL VALVE (MV) REPLACEMENT (N/A) Subjective: Feels better No shortness of breath  Objective: Vital signs in last 24 hours: Temp:  [96.4 F (35.8 C)-97.8 F (36.6 C)] 96.4 F (35.8 C) (09/19 0700) Pulse Rate:  [52-154] 66 (09/19 0700) Cardiac Rhythm:  [-] Normal sinus rhythm (09/19 0600) Resp:  [9-20] 9 (09/19 0700) BP: (90-118)/(33-71) 109/45 mmHg (09/19 0700) SpO2:  [93 %-100 %] 100 % (09/19 0700) Weight:  [223 lb 5.2 oz (101.3 kg)] 223 lb 5.2 oz (101.3 kg) (09/19 0500)  Hemodynamic parameters for last 24 hours:    Intake/Output from previous day: 09/18 0701 - 09/19 0700 In: 1725.8 [P.O.:330; I.V.:931.8; IV Piggyback:464] Out: 4385 [Urine:4385] Intake/Output this shift:    General appearance: alert and no distress Neurologic: intact Heart: regular rate and rhythm Lungs: diminished breath sounds bibasilar Abdomen: normal findings: soft, non-tender Wound: clean and dry  Lab Results:  Recent Labs  11/22/13 0313 11/23/13 0429  WBC 22.3* 14.5*  HGB 9.5* 9.2*  HCT 27.6* 26.3*  PLT 140* 153   BMET:  Recent Labs  11/22/13 0313 11/23/13 0429  NA 120* 122*  K 3.8 3.6*  CL 75* 77*  CO2 28 30  GLUCOSE 124* 117*  BUN 80* 78*  CREATININE 2.89* 2.79*  CALCIUM 8.3* 8.4    PT/INR:  Recent Labs  11/23/13 0429  LABPROT 19.3*  INR 1.63*   ABG    Component Value Date/Time   PHART 7.318* 11/11/2013 1642   HCO3 18.5* 11/11/2013 1642   TCO2 20 11/11/2013 1642   ACIDBASEDEF 7.0* 11/11/2013 1642   O2SAT 60.2 11/21/2013 0420   CBG (last 3)   Recent Labs  11/22/13 1554 11/23/13 0358 11/23/13 0824  GLUCAP 157* 173* 95    Assessment/Plan: S/P Procedure(s) (LRB): CORONARY ARTERY BYPASS GRAFTING (CABG), on pump, times two, using left internal mammary  artery, cryo saphenous vein. (N/A) INTRAOPERATIVE TRANSESOPHAGEAL ECHOCARDIOGRAM (N/A) MITRAL VALVE (MV) REPLACEMENT (N/A) - CV- in SR currently, was in and out of a fib yesterday  RESP- on vanco and zosyn for presumed pneumonia  Breathing comfortably on room air  RENAL- creatinine down a little this AM, continue dopamine  ID- Blood cultures positive GNR- central line(has been discontinued) was likely source, afebrile and WBC coming down on vanco and zosyn  Continue heparin until INR therapeutic   LOS: 18 days    HENDRICKSON,STEVEN C 11/23/2013

## 2013-11-23 NOTE — Progress Notes (Signed)
Subjective:   Patient seen this AM, in good spirits this morning, not requiring O2. No nausea, SOB, or abdominal pain.  Blood cultures positive for gram negative rods (2/2). C. Diff negative.  WBC's improved to 14.5. On Vanc/Zosyn Weight decreased ~2 kg in 24 hours. Output of 4.2L w/ net -2.6L since yesterday.  Objective Vital signs in last 24 hours: Filed Vitals:   11/23/13 0500 11/23/13 0600 11/23/13 0700 11/23/13 0800  BP: 103/45 113/45 109/45 108/40  Pulse: 68 69 66 65  Temp:   96.4 F (35.8 C)   TempSrc:      Resp: 16 16 9 17   Height:      Weight: 223 lb 5.2 oz (101.3 kg)     SpO2: 97% 97% 100% 100%   Weight change: -3 lb 12 oz (-1.7 kg)  Intake/Output Summary (Last 24 hours) at 11/23/13 0920 Last data filed at 11/23/13 0900  Gross per 24 hour  Intake 1646.15 ml  Output   4260 ml  Net -2613.85 ml   Weight Trending 11/23/13 0500 101.3 kg (223 lb 5.2 oz) 11/22/13 0500 103 kg (227 lb 1.2 oz) 11/21/13 0500 104.5 kg (230 lb 6.1 oz) 11/20/13 0500 105.7 kg (233 lb 0.4 oz 11/19/13 106.4 kg (234 lb 9.1 oz)  (? Validity) 11/18/13 0500 103 kg (227 lb 1.2 oz) 11/17/13 0500 104.9 kg (231 lb 4.2 oz) 11/16/13 0630 110.1 kg (242 lb 11.6 oz)   11/15/13 0500 110.2 kg (242 lb 15.2 oz)  11/14/13 0645 110 kg (242 lb 8.1 oz) 11/13/13 0500 107.8 kg (237 lb 10.5 oz)  11/12/13 0500 105.3 kg (232 lb 2.3 oz)  11/11/13 0530 101.5 kg (223 lb 12.3 oz)  11/10/13 0630 104 kg (229 lb 4.5 oz)  11/09/13 0200 98.4 kg (216 lb 14.9 oz) 11/08/13 1924 86.7 kg (191 lb 2.2 oz) 11/05/13 1330 86.7 kg (191 lb                      Pre op weight  Physical Exam:  General: Obese female, alert, cooperative, NAD. HEENT: PERRL, EOMI. Moist mucus membranes Neck: Full range of motion without pain, supple, no lymphadenopathy or carotid bruits Lungs: Clear to ascultation bilaterally, normal work of respiration, no wheezes, rales, rhonchi Heart: RRR, no murmurs, gallops, or rubs Abdomen: Soft, non-tender,  non-distended, BS + Extremities: +3 pitting edema extending to the hip. Somewhat improve today.  Neurologic: Alert & oriented X3, cranial nerves II-XII intact, strength grossly intact, sensation intact to light touch  Labs: Basic Metabolic Panel:  Recent Labs Lab 11/20/13 0411 11/21/13 0253 11/22/13 0313 11/23/13 0429  NA 121* 119* 120* 122*  K 3.8 3.8 3.8 3.6*  CL 78* 75* 75* 77*  CO2 27 27 28 30   GLUCOSE 134* 141* 124* 117*  BUN 76* 78* 80* 78*  CREATININE 2.82* 2.92* 2.89* 2.79*  CALCIUM 8.5 8.2* 8.3* 8.4  PHOS 5.3*  --   --   --      Recent Labs Lab 11/19/13 0420 11/20/13 0411 11/21/13 0253  AST 27 13 26   ALT 28 23 31   ALKPHOS 88 79 113  BILITOT 0.6 0.6 0.6  PROT 5.6* 5.7* 5.6*  ALBUMIN 2.4* 2.4* 2.3*    Recent Labs Lab 11/18/13 0430 11/19/13 0420 11/21/13 0253 11/22/13 0313 11/23/13 0429  WBC 9.9 10.5 30.8* 22.3* 14.5*  HGB 9.2* 9.2* 9.8* 9.5* 9.2*  HCT 25.8* 26.2* 28.2* 27.6* 26.3*  MCV 84.3 84.0 84.7 84.7 84.0  PLT 180 189 156  140* 153    Recent Labs Lab 11/22/13 0750 11/22/13 1148 11/22/13 1554 11/23/13 0358 11/23/13 0824  GLUCAP 123* 135* 157* 173* 95   Studies/Results: Dg Chest 1 View  11/22/2013   CLINICAL DATA:  Post thoracentesis  EXAM: CHEST - 1 VIEW  COMPARISON:  Study obtained earlier in the day  FINDINGS: There is no appreciable pneumothorax post thoracentesis. Right effusion is virtually completely resolved. There is patchy atelectatic change on the right. On the left there is persistent mid and lower lobe consolidation with apparent superimposed effusion. This appearance is stable. Heart is enlarged with pulmonary vascularity within normal limits. Patient is status post mitral valve replacement. Central catheter tip is at cavoatrial junction. No adenopathy.  IMPRESSION: Persistent left mid and lower lobe consolidation with effusion on the left. Effusion virtually completely resolved on the right. No pneumothorax. Patchy atelectasis on  the right. Stable cardiomegaly.   Electronically Signed   By: Lowella Grip M.D.   On: 11/22/2013 11:51   Ir Fluoro Guide Cv Line Right  11/21/2013   CLINICAL DATA:  80 year old female with multiple medical problems. Currently admitted for cardiac issues. She has been referred for central line placement.  EXAM: Right internal jugular central line placement WITH ULTRASOUND AND FLUOROSCOPIC GUIDANCE  No sedation  FLUOROSCOPY TIME:  Twelve seconds  PROCEDURE: The patient was advised of the possible risks and complications and agreed to undergo the procedure. The patient was then brought to the angiographic suite for the procedure.  The right neck was prepped with chlorhexidine, drapedin the usual sterile fashion using maximum barrier technique (cap and mask, sterile gown, sterile gloves, large sterile sheet, hand hygiene and cutaneous antisepsis) and infiltrated locally with 1% Lidocaine.  Ultrasound demonstrated patency of the right internal jugular vein, and this was documented with an image. Under real-time ultrasound guidance, this vein was accessed with a 21 gauge micropuncture needle and image documentation was performed. A 0.018 wire was introduced in to the vein.  A triple-lumen 5 French power PICC was cut at 17 cm. Over the 0.018 wire, a peel-away sheath was advanced into the right atrium. A 5 French triple lumen power injectable catheter was advanced to the lower SVC/right atrial junction. Fluoroscopy during the procedure and fluoro spot radiograph confirms appropriate catheter position. The catheter was flushed and covered with asterile dressing.  Catheter length: 17 cm  Complications: None  IMPRESSION: Status post right internal jugular vein triple-lumen catheter placement. The tip is at the cavoatrial junction and adequate for use.  Signed,  Dulcy Fanny. Earleen Newport, DO  [facility]  [facility]   Electronically Signed   By: Corrie Mckusick O.D.   On: 11/21/2013  17:42   Ir US Guide Vasc Access Right  11/21/2013   CLINICAL DATA:  80 year old female with multiple medical problems. Currently admitted for cardiac issues. She has been referred for central line placement.  EXAM: Right internal jugular central line placement WITH ULTRASOUND AND FLUOROSCOPIC GUIDANCE  No sedation  FLUOROSCOPY TIME:  Twelve seconds  PROCEDURE: The patient was advised of the possible risks and complications and agreed to undergo the procedure. The patient was then brought to the angiographic suite for the procedure.  The right neck was prepped with chlorhexidine, drapedin the usual sterile fashion using maximum barrier technique (cap and mask, sterile gown, sterile gloves, large sterile sheet, hand hygiene and cutaneous antisepsis) and infiltrated locally with 1% Lidocaine.  Ultrasound demonstrated patency of the right internal jugular vein, and this  was documented with an image. Under real-time ultrasound guidance, this vein was accessed with a 21 gauge micropuncture needle and image documentation was performed. A 0.018 wire was introduced in to the vein.  A triple-lumen 5 French power PICC was cut at 17 cm. Over the 0.018 wire, a peel-away sheath was advanced into the right atrium. A 5 French triple lumen power injectable catheter was advanced to the lower SVC/right atrial junction. Fluoroscopy during the procedure and fluoro spot radiograph confirms appropriate catheter position. The catheter was flushed and covered with asterile dressing.  Catheter length: 17 cm  Complications: None  IMPRESSION: Status post right internal jugular vein triple-lumen catheter placement. The tip is at the cavoatrial junction and adequate for use.  Signed,  Dulcy Fanny. Earleen Newport, DO  [facility]  [facility]   Electronically Signed   By: Corrie Mckusick O.D.   On: 11/21/2013 17:42   Dg Chest Port 1 View  11/23/2013   CLINICAL DATA:  Mitral valve replacement.  EXAM: PORTABLE  CHEST - 1 VIEW  COMPARISON:  11/22/2013  FINDINGS: Sequelae of mitral valve replacement are again identified. Right jugular central venous catheter remains in place with tip overlying the lower SVC. Cardiac silhouette remains mildly enlarged. Dense opacity in the left lung base is unchanged. Hazy opacities in the left-greater-than-right mid lungs are unchanged. There is a small amount of new hazy opacity in the right lung base. Left pleural effusion does not appear significantly changed. No pneumothorax is identified.  IMPRESSION: 1. Persistent left pleural effusion and dense left basilar consolidation which may reflect atelectasis versus infectious infiltrate. 2. New hazy opacity in the right lung base may reflect mild atelectasis and possibly small residual pleural effusion.   Electronically Signed   By: Logan Bores   On: 11/23/2013 08:06   Dg Chest Port 1 View  11/22/2013   CLINICAL DATA:  Chest congestion.  Elevated white blood count.  EXAM: PORTABLE CHEST - 1 VIEW  COMPARISON:  11/21/2013, 11/20/2013 and 11/19/2013 as well as 11/11/2013  FINDINGS: Central line remains good position. Recurrent pulmonary vascular congestion with persistent large left pleural effusion and slight increased moderate right pleural effusion. Slight linear atelectasis in the right midzone is essentially unchanged.  IMPRESSION: Recurrent pulmonary vascular congestion. Persistent effusions, increased on the right.   Electronically Signed   By: Rozetta Nunnery M.D.   On: 11/22/2013 08:08   Dg Chest Port 1 View  11/21/2013   CLINICAL DATA:  80 year old female with elevated white count.  EXAM: PORTABLE CHEST - 1 VIEW  COMPARISON:  11/21/2013 and multiple prior chest radiographs.  FINDINGS: A right IJ central venous catheter is again noted with tip overlying the superior cavoatrial junction.  Cardiomegaly and cardiac valve replacement changes again noted.  A right subclavian central venous catheter has been removed.  There is no evidence  of pneumothorax.  Bilateral lower lung consolidations/ atelectasis again identified, left greater than right, with pulmonary vascular congestion and possible interstitial pulmonary edema.  No acute bony abnormalities are identified.  IMPRESSION: Right IJ central venous catheter placement with tip overlying the superior cavoatrial junction. No evidence of pneumothorax.  Unchanged bilateral lower lung atelectasis/ consolidation, left-greater-than-right, with persistent pulmonary vascular congestion and possible interstitial pulmonary edema.   Electronically Signed   By: Hassan Rowan M.D.   On: 11/21/2013 17:09   US Thoracentesis Asp Pleural Space W/img Guide  11/22/2013   CLINICAL DATA:  Shortness of breath, right pleural effusion, request for thoracentesis.  EXAM: ULTRASOUND GUIDED DIAGNOSTIC AND THERAPEUTIC THORACENTESIS.  COMPARISON:  None.  PROCEDURE: An ultrasound guided thoracentesis was thoroughly discussed with the patient and questions answered. The benefits, risks, alternatives and complications were also discussed. The patient understands and wishes to proceed with the procedure. Written consent was obtained.  Ultrasound was performed to localize and mark an adequate pocket of fluid in the right chest. The area was then prepped and draped in the normal sterile fashion. 1% Lidocaine was used for local anesthesia. Under ultrasound guidance a 19 gauge Yueh catheter was introduced. Thoracentesis was performed. The catheter was removed and a dressing applied.  Complications:  None.  FINDINGS: A total of approximately 500 ml of blood tinged fluid was removed. A fluid sample was sent for laboratory analysis.  IMPRESSION: Successful ultrasound guided right thoracentesis yielding 500 ml of pleural fluid.  Read By:  Tsosie Billing PA-C   Electronically Signed   By: Corrie Mckusick O.D.   On: 11/22/2013 12:06   Medications: Infusions: . sodium chloride 10 mL/hr (11/22/13 1821)  . sodium chloride 250 mL (11/21/13  1541)  . amiodarone 30 mg/hr (11/22/13 2116)  . DOPamine 3 mcg/kg/min (11/21/13 1530)  . heparin 1,950 Units/hr (11/23/13 0450)    Scheduled Medications: . sodium chloride   Intravenous Once  . sodium chloride   Intravenous Once  . sodium chloride   Intravenous Once  . amiodarone  200 mg Oral BID  . bisacodyl  10 mg Oral Daily   Or  . bisacodyl  10 mg Rectal Daily  . darbepoetin (ARANESP) injection - NON-DIALYSIS  100 mcg Subcutaneous Q Wed-1800  . docusate  200 mg Oral Daily  . feeding supplement (GLUCERNA SHAKE)  237 mL Oral Q1500  . furosemide  160 mg Intravenous Q6H  . insulin aspart  0-24 Units Subcutaneous 6 times per day  . lactulose  20 g Oral Daily  . magic mouthwash  5 mL Oral TID  . metolazone  5 mg Oral Daily  . metoprolol tartrate  12.5 mg Oral BID   Or  . metoprolol tartrate  12.5 mg Per Tube BID  . neomycin-bacitracin-polymyxin   Topical Daily  . nystatin cream   Topical TID  . pantoprazole (PROTONIX) IV  40 mg Intravenous Q24H  . piperacillin-tazobactam (ZOSYN)  IV  2.25 g Intravenous 4 times per day  . potassium chloride  40 mEq Oral Daily  . sodium chloride  10-40 mL Intracatheter Q12H  . vancomycin  1,500 mg Intravenous Q48H  . warfarin  5 mg Oral q1800  . Warfarin - Physician Dosing Inpatient   Does not apply q63    Background 80 year old female with multiple medical issues including stage III CKD who is status post CABG and mitral valve replacement with postoperative acute on chronic renal failure (baseline creatinine around 1.5), marked volume overload (50 lb +)  Assessment/Recommendations  Renal- Acute on chronic kidney disease in the setting of above. The insult was most likely hemodynamic in nature/ATN and not unusual in this scenario. Cr improved to 2.79 today. Still on Lasix 160 mg IV q6h + Metolazone 5 mg qd. Output of 4.2L w/ net -2.6L since yesterday. Edema slightly improved this AM.  Hypertension/volume - Stable. She remains on low dose  dopamine. Continue current diuretic program. Still diuresing slowly.  Bacteremia- Blood cultures 2/2 positive for gram negative rods. On Zosyn, WBC continue to improve.  Anemia - Situational and CKD- continue weekly aranesp. Iron studies from 9/17 w/ iron of 19, ferritin of  604 and Tsat of 7%. Will consider iron load after infection issues addressed.  Hypokalemia -  K 3.6 this AM. On 40 mEq K daily Hyponatremia- Dilutional and most likely due to volume overload. Slight improvement to 122 this AM.  S/p CABG/MVR AFib on heparin, BB, amio    Signed: Luanne Bras, MD 11/23/2013 9:20 AM  I have seen and examined this patient and agree with assessment and plan as outlined by Dr. Ronnald Ramp. Improved diuretic responsiveness, slow reduction in creatinine, and improvement in hyponatremia all encouraging from a renal standpoint.  Continue current diuretic regimen. We may have to withhold metolazone in the near future with steady rise in CO2.  GNR bacteremia (ID pending) on zosyn with improvement in leukocytosis. ? Can vanco be stopped? Shamere Campas B,MD 11/23/2013 11:43 AM

## 2013-11-23 NOTE — Progress Notes (Addendum)
CRITICAL VALUE ALERT  Critical value received: positive blood cultures from 9/17, gram negative rods  Date of notification: 11/23/13  Time of notification: 0200, 0445  Critical value read back: yes  Nurse who received alert: Vella Raring, RN  MD notified (1st page): Dr. Roxan Hockey on call. Will be notified before the end of the shift. Pt is on Vancomycin and Zosyn.  Time of first page: 0650  MD notified (2nd page):  Time of second page:  Responding MD: Dr. Roxan Hockey  Time MD responded: [phone]

## 2013-11-23 NOTE — Progress Notes (Addendum)
ANTICOAGULATION CONSULT NOTE - Follow Up Consult  Pharmacy Consult for Heparin  Indication: atrial fibrillation  Allergies  Allergen Reactions  . Aspirin Other (See Comments)    High doses caused stomach bleeds    Patient Measurements: Height: 5\' 7"  (170.2 cm) Weight: 227 lb 1.2 oz (103 kg) IBW/kg (Calculated) : 61.6  Vital Signs: Temp: 97.8 F (36.6 C) (09/19 0400) Temp src: Oral (09/19 0400) BP: 118/52 mmHg (09/19 0100) Pulse Rate: 74 (09/19 0100)  Labs:  Recent Labs  11/21/13 0253 11/22/13 0017 11/22/13 0313 11/23/13 0145  HGB 9.8*  --  9.5*  --   HCT 28.2*  --  27.6*  --   PLT 156  --  140*  --   LABPROT 16.9*  --  19.2*  --   INR 1.37  --  1.62*  --   HEPARINUNFRC 0.18* 0.26* 0.25* 0.11*  CREATININE 2.92*  --  2.89*  --     Estimated Creatinine Clearance: 19.5 ml/min (by C-G formula based on Cr of 2.89).   Assessment: Low heparin level after re-starting heparin after IR procedure, other labs as above, no issues per RN.   Goal of Therapy:  Heparin level 0.3-0.4 units/ml, low goal per Dr. Prescott Gum Monitor platelets by anticoagulation protocol: Yes   Plan:  -Increase heparin slightly to 1950 units/hr given low goal -1300 HL -Daily CBC/HL -Monitor for bleeding  Narda Bonds 11/23/2013,4:41 AM   Addendum: Heparin level is therapeutic at 0.36 on 1950 units/hr. No bleeding noted, CBC is stable.   Continue current rate Warfarin dosing per MD  [facility], Pharm.D., BCPS Clinical Pharmacist Pager: [phone] 11/23/2013 3:54 PM

## 2013-11-23 NOTE — Progress Notes (Signed)
No new issues today  Comfortable  BP 114/44  Pulse 69  Temp(Src) 97.5 F (36.4 C) (Axillary)  Resp 14  Ht 5\' 7"  (1.702 m)  Wt 223 lb 5.2 oz (101.3 kg)  BMI 34.97 kg/m2  SpO2 96%   Intake/Output Summary (Last 24 hours) at 11/23/13 1753 Last data filed at 11/23/13 1700  Gross per 24 hour  Intake 1988.17 ml  Output   4475 ml  Net -2486.83 ml    Good UO- continue to follow

## 2013-11-24 LAB — GLUCOSE, CAPILLARY
GLUCOSE-CAPILLARY: 142 mg/dL — AB (ref 70–99)
GLUCOSE-CAPILLARY: 154 mg/dL — AB (ref 70–99)
GLUCOSE-CAPILLARY: 186 mg/dL — AB (ref 70–99)
GLUCOSE-CAPILLARY: 215 mg/dL — AB (ref 70–99)
GLUCOSE-CAPILLARY: 216 mg/dL — AB (ref 70–99)
Glucose-Capillary: 118 mg/dL — ABNORMAL HIGH (ref 70–99)
Glucose-Capillary: 130 mg/dL — ABNORMAL HIGH (ref 70–99)
Glucose-Capillary: 178 mg/dL — ABNORMAL HIGH (ref 70–99)
Glucose-Capillary: 181 mg/dL — ABNORMAL HIGH (ref 70–99)

## 2013-11-24 LAB — CBC
HCT: 28.9 % — ABNORMAL LOW (ref 36.0–46.0)
Hemoglobin: 9.8 g/dL — ABNORMAL LOW (ref 12.0–15.0)
MCH: 28.7 pg (ref 26.0–34.0)
MCHC: 33.9 g/dL (ref 30.0–36.0)
MCV: 84.8 fL (ref 78.0–100.0)
Platelets: 177 10*3/uL (ref 150–400)
RBC: 3.41 MIL/uL — ABNORMAL LOW (ref 3.87–5.11)
RDW: 15.7 % — ABNORMAL HIGH (ref 11.5–15.5)
WBC: 10.9 10*3/uL — ABNORMAL HIGH (ref 4.0–10.5)

## 2013-11-24 LAB — BASIC METABOLIC PANEL
Anion gap: 14 (ref 5–15)
BUN: 73 mg/dL — ABNORMAL HIGH (ref 6–23)
CO2: 31 mEq/L (ref 19–32)
Calcium: 8.5 mg/dL (ref 8.4–10.5)
Chloride: 75 mEq/L — ABNORMAL LOW (ref 96–112)
Creatinine, Ser: 2.67 mg/dL — ABNORMAL HIGH (ref 0.50–1.10)
GFR calc Af Amer: 18 mL/min — ABNORMAL LOW (ref >90)
GFR calc non Af Amer: 16 mL/min — ABNORMAL LOW (ref >90)
Glucose, Bld: 138 mg/dL — ABNORMAL HIGH (ref 70–99)
Potassium: 3.4 mEq/L — ABNORMAL LOW (ref 3.7–5.3)
Sodium: 120 mEq/L — CL (ref 137–147)

## 2013-11-24 LAB — PROTIME-INR
INR: 1.83 — ABNORMAL HIGH (ref 0.00–1.49)
Prothrombin Time: 21.2 seconds — ABNORMAL HIGH (ref 11.6–15.2)

## 2013-11-24 LAB — HEPARIN LEVEL (UNFRACTIONATED): Heparin Unfractionated: 0.31 IU/mL (ref 0.30–0.70)

## 2013-11-24 MED ORDER — POTASSIUM CHLORIDE CRYS ER 20 MEQ PO TBCR
40.0000 meq | EXTENDED_RELEASE_TABLET | Freq: Once | ORAL | Status: AC
  Administered 2013-11-24: 40 meq via ORAL
  Filled 2013-11-24: qty 2

## 2013-11-24 MED ORDER — PIPERACILLIN-TAZOBACTAM 3.375 G IVPB
3.3750 g | Freq: Three times a day (TID) | INTRAVENOUS | Status: DC
  Administered 2013-11-24 – 2013-11-25 (×3): 3.375 g via INTRAVENOUS
  Filled 2013-11-24 (×4): qty 50

## 2013-11-24 NOTE — Progress Notes (Signed)
Subjective:   Patient seen this AM, no new complaints. Resting comfortably in her chair. Ambulated this AM to nurses station. Blood cultures positive for gram negative rods (2/2), not speciated yet. Pleural fluid w/ no growth. Sputum culture also positive for gram negative rods.  WBC's improved to 10.9. On Vanc/Zosyn Weight decreased ~2.5 kg in 24 hours. Output of 4.2L over past 24 hours. Cr improved to 2.67.  Objective Vital signs in last 24 hours: Filed Vitals:   11/24/13 0300 11/24/13 0400 11/24/13 0500 11/24/13 0600  BP: 110/41 110/46 110/44   Pulse: 67 65 65 65  Temp:  97.3 F (36.3 C)    TempSrc:  Oral    Resp: 16 20 16 16   Height:      Weight:    217 lb 13 oz (98.8 kg)  SpO2: 98% 97% 97% 98%   Weight change: -5 lb 8.2 oz (-2.5 kg)  Intake/Output Summary (Last 24 hours) at 11/24/13 0727 Last data filed at 11/24/13 0600  Gross per 24 hour  Intake 2593.9 ml  Output   4233 ml  Net -1639.1 ml   Weight Trending 11/24/13 0600 98.8 kg (217 lb 13 oz) 11/23/13 0500 101.3 kg (223 lb 5.2 oz) 11/22/13 0500 103 kg (227 lb 1.2 oz) 11/21/13 0500 104.5 kg (230 lb 6.1 oz) 11/20/13 0500 105.7 kg (233 lb 0.4 oz 11/19/13 106.4 kg (234 lb 9.1 oz)  (? Validity) 11/18/13 0500 103 kg (227 lb 1.2 oz) 11/17/13 0500 104.9 kg (231 lb 4.2 oz) 11/16/13 0630 110.1 kg (242 lb 11.6 oz)   11/15/13 0500 110.2 kg (242 lb 15.2 oz)  11/14/13 0645 110 kg (242 lb 8.1 oz) 11/13/13 0500 107.8 kg (237 lb 10.5 oz)  11/12/13 0500 105.3 kg (232 lb 2.3 oz)  11/11/13 0530 101.5 kg (223 lb 12.3 oz)  11/10/13 0630 104 kg (229 lb 4.5 oz)  11/09/13 0200 98.4 kg (216 lb 14.9 oz) 11/08/13 1924 86.7 kg (191 lb 2.2 oz) 11/05/13 1330 86.7 kg (191 lb                      Pre op weight  Physical Exam:  General: Obese female, alert, cooperative, NAD. HEENT: PERRL, EOMI. Moist mucus membranes Neck: Full range of motion without pain, supple, no lymphadenopathy or carotid bruits Lungs: Clear to ascultation  bilaterally, normal work of respiration, no wheezes, rales, rhonchi Crackles at right base to my exam  Heart: RRR, no murmurs, gallops, or rubs Abdomen: Soft, non-tender, non-distended, BS + Extremities: +3 pitting edema extending to the hip. Still w/ mild improvement. *No sacral edema Neurologic: Alert & oriented X3, cranial nerves II-XII intact, strength grossly intact, sensation intact to light touch  Labs: Basic Metabolic Panel:  Recent Labs Lab 11/20/13 0411  11/22/13 0313 11/23/13 0429 11/24/13 0250  NA 121*  < > 120* 122* 120*  K 3.8  < > 3.8 3.6* 3.4*  CL 78*  < > 75* 77* 75*  CO2 27  < > 28 30 31   GLUCOSE 134*  < > 124* 117* 138*  BUN 76*  < > 80* 78* 73*  CREATININE 2.82*  < > 2.89* 2.79* 2.67*  CALCIUM 8.5  < > 8.3* 8.4 8.5  PHOS 5.3*  --   --   --   --   < > = values in this interval not displayed.   Recent Labs Lab 11/19/13 0420 11/20/13 0411 11/21/13 0253  AST 27 13 26   ALT 28 23  31  ALKPHOS 88 79 113  BILITOT 0.6 0.6 0.6  PROT 5.6* 5.7* 5.6*  ALBUMIN 2.4* 2.4* 2.3*    Recent Labs Lab 11/19/13 0420 11/21/13 0253 11/22/13 0313 11/23/13 0429 11/24/13 0250  WBC 10.5 30.8* 22.3* 14.5* 10.9*  HGB 9.2* 9.8* 9.5* 9.2* 9.8*  HCT 26.2* 28.2* 27.6* 26.3* 28.9*  MCV 84.0 84.7 84.7 84.0 84.8  PLT 189 156 140* 153 177    Recent Labs Lab 11/23/13 0824 11/23/13 1230 11/23/13 1616 11/23/13 1936 11/24/13 0010  GLUCAP 95 130* 154* 171* 181*   Studies/Results: Dg Chest 1 View  11/22/2013   CLINICAL DATA:  Post thoracentesis  EXAM: CHEST - 1 VIEW  COMPARISON:  Study obtained earlier in the day  FINDINGS: There is no appreciable pneumothorax post thoracentesis. Right effusion is virtually completely resolved. There is patchy atelectatic change on the right. On the left there is persistent mid and lower lobe consolidation with apparent superimposed effusion. This appearance is stable. Heart is enlarged with pulmonary vascularity within normal limits. Patient  is status post mitral valve replacement. Central catheter tip is at cavoatrial junction. No adenopathy.  IMPRESSION: Persistent left mid and lower lobe consolidation with effusion on the left. Effusion virtually completely resolved on the right. No pneumothorax. Patchy atelectasis on the right. Stable cardiomegaly.   Electronically Signed   By: Lowella Grip M.D.   On: 11/22/2013 11:51   Dg Chest Port 1 View  11/23/2013   CLINICAL DATA:  Mitral valve replacement.  EXAM: PORTABLE CHEST - 1 VIEW  COMPARISON:  11/22/2013  FINDINGS: Sequelae of mitral valve replacement are again identified. Right jugular central venous catheter remains in place with tip overlying the lower SVC. Cardiac silhouette remains mildly enlarged. Dense opacity in the left lung base is unchanged. Hazy opacities in the left-greater-than-right mid lungs are unchanged. There is a small amount of new hazy opacity in the right lung base. Left pleural effusion does not appear significantly changed. No pneumothorax is identified.  IMPRESSION: 1. Persistent left pleural effusion and dense left basilar consolidation which may reflect atelectasis versus infectious infiltrate. 2. New hazy opacity in the right lung base may reflect mild atelectasis and possibly small residual pleural effusion.   Electronically Signed   By: Logan Bores   On: 11/23/2013 08:06   US Thoracentesis Asp Pleural Space W/img Guide  11/22/2013   CLINICAL DATA:  Shortness of breath, right pleural effusion, request for thoracentesis.  EXAM: ULTRASOUND GUIDED DIAGNOSTIC AND THERAPEUTIC THORACENTESIS.  COMPARISON:  None.  PROCEDURE: An ultrasound guided thoracentesis was thoroughly discussed with the patient and questions answered. The benefits, risks, alternatives and complications were also discussed. The patient understands and wishes to proceed with the procedure. Written consent was obtained.  Ultrasound was performed to localize and mark an adequate pocket of fluid in the  right chest. The area was then prepped and draped in the normal sterile fashion. 1% Lidocaine was used for local anesthesia. Under ultrasound guidance a 19 gauge Yueh catheter was introduced. Thoracentesis was performed. The catheter was removed and a dressing applied.  Complications:  None.  FINDINGS: A total of approximately 500 ml of blood tinged fluid was removed. A fluid sample was sent for laboratory analysis.  IMPRESSION: Successful ultrasound guided right thoracentesis yielding 500 ml of pleural fluid.  Read By:  Tsosie Billing PA-C   Electronically Signed   By: Corrie Mckusick O.D.   On: 11/22/2013 12:06   Medications: Infusions: . sodium chloride 10 mL/hr (11/23/13 1620)  .  sodium chloride 250 mL (11/21/13 1541)  . amiodarone 30 mg/hr (11/24/13 0600)  . DOPamine 3 mcg/kg/min (11/24/13 0600)  . heparin 1,950 Units/hr (11/24/13 0600)    Scheduled Medications: . amiodarone  200 mg Oral BID  . bisacodyl  10 mg Oral Daily   Or  . bisacodyl  10 mg Rectal Daily  . darbepoetin (ARANESP) injection - NON-DIALYSIS  100 mcg Subcutaneous Q Wed-1800  . docusate  200 mg Oral Daily  . feeding supplement (GLUCERNA SHAKE)  237 mL Oral Q1500  . furosemide  160 mg Intravenous Q6H  . insulin aspart  0-24 Units Subcutaneous 6 times per day  . lactulose  20 g Oral Daily  . magic mouthwash  5 mL Oral TID  . metolazone  5 mg Oral Daily  . metoprolol tartrate  12.5 mg Oral BID   Or  . metoprolol tartrate  12.5 mg Per Tube BID  . neomycin-bacitracin-polymyxin   Topical Daily  . nystatin cream   Topical TID  . pantoprazole  40 mg Oral Daily  . piperacillin-tazobactam (ZOSYN)  IV  2.25 g Intravenous 4 times per day  . potassium chloride  40 mEq Oral Daily  . sodium chloride  10-40 mL Intracatheter Q12H  . vancomycin  1,500 mg Intravenous Q48H  . warfarin  5 mg Oral q1800  . Warfarin - Physician Dosing Inpatient   Does not apply q26    Background 80 year old female with multiple medical issues  including stage III CKD who is status post CABG and mitral valve replacement with postoperative acute on chronic renal failure (baseline creatinine around 1.5), marked volume overload (50 lb +)  Assessment/Recommendations  Renal- Acute on chronic kidney disease in the setting of above. The insult was most likely hemodynamic in nature/ATN and not unusual in this scenario. Cr improved to 2.67 today. Still on Lasix 160 mg IV q6h + Metolazone 5 mg qd. Output of 4.2L since yesterday. HOC3 continues to increase, will hold metolazone at this time.  Hypertension/volume - Stable.  Continue diuretic program as above with deletion of metolazone. Still diuresing well. Gram Negative Bacteremia- Blood cultures 2/2 positive for gram negative rods, also w/ gram negative rods in sputum culture. On Vanc/Zosyn, WBC continue to improve. Would suggest discontinuing Vancomycin at this time.  Anemia - Situational and CKD- continue weekly aranesp. Iron studies from 9/17 w/ iron of 19, ferritin of 604 and Tsat of 7%. Will consider iron load after infection issues addressed.  Hypokalemia -  K 3.4 this AM, has slowly decreased over the past few days despite po repletion. On 40 mEq K daily currently. Will hold Metolazone for now given HCO3 as discussed above, should help w/ K. Give additional dose of KDur today Hyponatremia- Dilutional and most likely due to volume overload. Decreased to 120 again today.  S/p CABG/MVR AFib on heparin, BB, amio    Signed: Luanne Bras, MD 11/24/2013 7:27 AM  I have seen and examined this patient and agree with assessment and recommendation in the note of Dr. Ronnald Ramp. Pt continues to diurese well, (appears more diuretic responsive), but with rising CO2 - will hold the metolazone and continue with the lasix alone for now.  Creatinine trending down.Have ordered an additional dose of KDur for this afternoon. GNR in blood still not speciated but leukocytosis improving on zosyn - can vanco be  discontinued??? ALso unclear benefit of dopamine at this point. Emanuell Morina B,MD 11/24/2013 9:44 AM

## 2013-11-24 NOTE — Progress Notes (Signed)
16 Days Post-Op Procedure(s) (LRB): CORONARY ARTERY BYPASS GRAFTING (CABG), on pump, times two, using left internal mammary artery, cryo saphenous vein. (N/A) INTRAOPERATIVE TRANSESOPHAGEAL ECHOCARDIOGRAM (N/A) MITRAL VALVE (MV) REPLACEMENT (N/A) Subjective: [name]/o nausea Denies pain Ambulated to nursing station this AM  Objective: Vital signs in last 24 hours: Temp:  [97.3 F (36.3 [name])-97.7 F (36.5 [name])] 97.7 F (36.5 [name]) (09/20 0737) Pulse Rate:  [65-164] 67 (09/20 0800) Cardiac Rhythm:  [-] Normal sinus rhythm (09/20 0800) Resp:  [12-25] 13 (09/20 0800) BP: (101-120)/(37-86) 112/42 mmHg (09/20 0800) SpO2:  [68 %-100 %] 98 % (09/20 0800) Weight:  [217 lb 13 oz (98.8 kg)] 217 lb 13 oz (98.8 kg) (09/20 0600)  Hemodynamic parameters for last 24 hours:    Intake/Output from previous day: 09/19 0701 - 09/20 0700 In: 2877 [P.O.:720; I.V.:1193; IV Piggyback:964] Out: 7824 [Urine:4383] Intake/Output this shift: Total I/O In: 117.1 [I.V.:51.1; IV Piggyback:66] Out: 125 [Urine:125]  General appearance: alert and no distress Neurologic: intact Heart: regular rate and rhythm Lungs: diminished breath sounds bibasilar Wound: clean and dry LE Edema persists  Lab Results:  Recent Labs  11/23/13 0429 11/24/13 0250  WBC 14.5* 10.9*  HGB 9.2* 9.8*  HCT 26.3* 28.9*  PLT 153 177   BMET:  Recent Labs  11/23/13 0429 11/24/13 0250  NA 122* 120*  K 3.6* 3.4*  CL 77* 75*  CO2 30 31  GLUCOSE 117* 138*  BUN 78* 73*  CREATININE 2.79* 2.67*  CALCIUM 8.4 8.5    PT/INR:  Recent Labs  11/24/13 0250  LABPROT 21.2*  INR 1.83*   ABG    Component Value Date/Time   PHART 7.318* 11/11/2013 1642   HCO3 18.5* 11/11/2013 1642   TCO2 20 11/11/2013 1642   ACIDBASEDEF 7.0* 11/11/2013 1642   O2SAT 60.2 11/21/2013 0420   CBG (last 3)   Recent Labs  11/23/13 1936 11/24/13 0010 11/24/13 0735  GLUCAP 171* 181* 118*    Assessment/Plan: S/P Procedure(s) (LRB): CORONARY ARTERY BYPASS  GRAFTING (CABG), on pump, times two, using left internal mammary artery, cryo saphenous vein. (N/A) INTRAOPERATIVE TRANSESOPHAGEAL ECHOCARDIOGRAM (N/A) MITRAL VALVE (MV) REPLACEMENT (N/A) - CV- in Sinus rhythm on dopamine  INR slowly increasing on 5 mg coumadin daily  RESP- stable off room air  RENAL- continues to diurese well, creatinine down a little again today  Remains on dopamine  Hyponatremia persists  ENDO- CBG well controlled  Deconditioning- continue to mobilize   LOS: 19 days    Claudia [name] 11/24/2013

## 2013-11-24 NOTE — Progress Notes (Signed)
C/o nausea  BP 115/48  Pulse 72  Temp(Src) 97.4 F (36.3 C) (Oral)  Resp 13  Ht 5\' 7"  (1.702 m)  Wt 217 lb 13 oz (98.8 kg)  BMI 34.11 kg/m2  SpO2 95%   Intake/Output Summary (Last 24 hours) at 11/24/13 1826 Last data filed at 11/24/13 1400  Gross per 24 hour  Intake 1706.6 ml  Output   3108 ml  Net -1401.4 ml    Cultures growing serratia- vanco dc'ed, continue zosyn

## 2013-11-24 NOTE — Progress Notes (Addendum)
ANTICOAGULATION AND ANTIBIOTIC CONSULT NOTE - Follow Up Consult  Pharmacy Consult for Heparin  Indication: atrial fibrillation  Allergies  Allergen Reactions  . Aspirin Other (See Comments)    High doses caused stomach bleeds    Patient Measurements: Height: 5\' 7"  (170.2 cm) Weight: 217 lb 13 oz (98.8 kg) IBW/kg (Calculated) : 61.6  Vital Signs: Temp: 97.7 F (36.5 C) (09/20 0737) Temp src: Oral (09/20 0737) BP: 112/42 mmHg (09/20 0800) Pulse Rate: 67 (09/20 0800)  Labs:  Recent Labs  11/22/13 0313 11/23/13 0145 11/23/13 0429 11/23/13 1510 11/24/13 0250  HGB 9.5*  --  9.2*  --  9.8*  HCT 27.6*  --  26.3*  --  28.9*  PLT 140*  --  153  --  177  LABPROT 19.2*  --  19.3*  --  21.2*  INR 1.62*  --  1.63*  --  1.83*  HEPARINUNFRC 0.25* 0.11*  --  0.36 0.31  CREATININE 2.89*  --  2.79*  --  2.67*  WBC 10.9  Estimated Creatinine Clearance: 20.6 ml/min (by C-G formula based on Cr of 2.67).   Assessment: 80 y/o female s/p CABG/MVR on 9/5. She continues on heparin to warfarin bridge for Afib. Heparin level is therapeutic at 0.31 on 1950 units/hr. No bleeding noted, CBC is stable.  She also continues on day 4 vancomycin and Zosyn for GNR pneumonia and bacteremia. WBC have trended down, she is afebrile, and renal function is improving.  Vanc/Zinacef pre/post-op 9/4 >> 9/5 Ceftaz 9/6 >>9/17 Vanc 9/17>> Zosyn 9/17>> Flucon 9/17>>9/18  9/12 BCx - ngtd 9/12 UCx - NEG 9/17 UCx - NEG 9/17 BCx2 - GNR 9/17 Resp - GNR 9/17 Cdiff neg 9/18 R pl fluid - ngtd  Goal of Therapy:  Heparin level 0.3-0.4 units/ml, low goal per Dr. Prescott Gum Monitor platelets by anticoagulation protocol: Yes  Vancomycin trough 15-20 mcg/ml Eradication of infection   Plan:  - Continue heparin drip at 1950 units/hr  - Warfarin per MD dosing - Daily CBC/HL - Monitor for s/sx of bleeding  - Continue vancomycin 1500 mg IV q48h - Change Zosyn to 3.375 g IV q8h (infuse over 4 hrs) - Recommend  discontinuing vancomycin since both trach aspirate and blood cultures growing GNR - Monitor renal function  [facility], Pharm.D., BCPS Clinical Pharmacist Pager: [phone] 11/24/2013 10:30 AM   Addendum: Confirmed with micro lab that the Serratia growing in the trach aspirate is sensitive to Zosyn.  [place], Pharm.D., BCPS Clinical Pharmacist Pager: [phone] 11/24/2013 3:51 PM   ================================================== Addendum:  [facility] called back this AM and said there was an error and they can't yet confirm that the Serratia is Zosyn sensitive, results will be available 9/22 Thank you for allowing me to take part in this patient's care,  Narda Bonds, PharmD Clinical Pharmacist Phone: [phone] Pager: [phone] 11/25/2013 7:50 AM

## 2013-11-25 LAB — CBC
HCT: 29.3 % — ABNORMAL LOW (ref 36.0–46.0)
Hemoglobin: 10.2 g/dL — ABNORMAL LOW (ref 12.0–15.0)
MCH: 29.1 pg (ref 26.0–34.0)
MCHC: 34.8 g/dL (ref 30.0–36.0)
MCV: 83.7 fL (ref 78.0–100.0)
Platelets: 167 10*3/uL (ref 150–400)
RBC: 3.5 MIL/uL — ABNORMAL LOW (ref 3.87–5.11)
RDW: 15.6 % — ABNORMAL HIGH (ref 11.5–15.5)
WBC: 11 10*3/uL — ABNORMAL HIGH (ref 4.0–10.5)

## 2013-11-25 LAB — GLUCOSE, CAPILLARY
Glucose-Capillary: 170 mg/dL — ABNORMAL HIGH (ref 70–99)
Glucose-Capillary: 151 mg/dL — ABNORMAL HIGH (ref 70–99)
Glucose-Capillary: 137 mg/dL — ABNORMAL HIGH (ref 70–99)
Glucose-Capillary: 179 mg/dL — ABNORMAL HIGH (ref 70–99)
Glucose-Capillary: 182 mg/dL — ABNORMAL HIGH (ref 70–99)
Glucose-Capillary: 139 mg/dL — ABNORMAL HIGH (ref 70–99)

## 2013-11-25 LAB — BASIC METABOLIC PANEL
Anion gap: 12 (ref 5–15)
BUN: 69 mg/dL — ABNORMAL HIGH (ref 6–23)
CO2: 34 mEq/L — ABNORMAL HIGH (ref 19–32)
Calcium: 8.9 mg/dL (ref 8.4–10.5)
Chloride: 77 mEq/L — ABNORMAL LOW (ref 96–112)
Creatinine, Ser: 2.59 mg/dL — ABNORMAL HIGH (ref 0.50–1.10)
GFR calc Af Amer: 19 mL/min — ABNORMAL LOW (ref >90)
GFR calc non Af Amer: 17 mL/min — ABNORMAL LOW (ref >90)
Glucose, Bld: 144 mg/dL — ABNORMAL HIGH (ref 70–99)
Potassium: 3.9 mEq/L (ref 3.7–5.3)
Sodium: 123 mEq/L — ABNORMAL LOW (ref 137–147)

## 2013-11-25 LAB — CULTURE, BLOOD (ROUTINE X 2)

## 2013-11-25 LAB — PROTIME-INR
INR: 2.88 — ABNORMAL HIGH (ref 0.00–1.49)
Prothrombin Time: 30.2 seconds — ABNORMAL HIGH (ref 11.6–15.2)

## 2013-11-25 LAB — HEPARIN LEVEL (UNFRACTIONATED): Heparin Unfractionated: 0.42 IU/mL (ref 0.30–0.70)

## 2013-11-25 MED ORDER — WARFARIN SODIUM 2.5 MG PO TABS
2.5000 mg | ORAL_TABLET | Freq: Every day | ORAL | Status: DC
  Administered 2013-11-25: 2.5 mg via ORAL
  Filled 2013-11-25 (×2): qty 1

## 2013-11-25 MED ORDER — CEFEPIME HCL 2 G IJ SOLR
2.0000 g | INTRAMUSCULAR | Status: DC
  Administered 2013-11-25 – 2013-11-26 (×2): 2 g via INTRAVENOUS
  Filled 2013-11-25 (×2): qty 2

## 2013-11-25 MED ORDER — INFLUENZA VAC SPLIT QUAD 0.5 ML IM SUSY: 0.5000 mL | PREFILLED_SYRINGE | INTRAMUSCULAR | Status: DC | PRN

## 2013-11-25 MED ORDER — WARFARIN SODIUM 3 MG PO TABS
3.0000 mg | ORAL_TABLET | Freq: Every day | ORAL | Status: DC
  Filled 2013-11-25: qty 1

## 2013-11-25 MED ORDER — OXYCODONE HCL 5 MG PO TABS
5.0000 mg | ORAL_TABLET | Freq: Four times a day (QID) | ORAL | Status: DC | PRN
  Administered 2013-11-25 – 2013-12-01 (×7): 5 mg via ORAL
  Filled 2013-11-25 (×7): qty 1

## 2013-11-25 NOTE — Progress Notes (Addendum)
[place] for Vancomycin / Zosyn to Cefepime Indication: Serratia sepsis with MVR  Allergies  Allergen Reactions  . Aspirin Other (See Comments)    High doses caused stomach bleeds    Patient Measurements: Height: 5\' 7"  (170.2 cm) Weight: 210 lb 12.2 oz (95.6 kg) IBW/kg (Calculated) : 61.6  Labs:  Recent Labs  11/23/13 0429 11/24/13 0250 11/25/13 0213  WBC 14.5* 10.9* 11.0*  HGB 9.2* 9.8* 10.2*  PLT 153 177 167  CREATININE 2.79* 2.67* 2.59*    Microbiology: Recent Results (from the past 720 hour(s))  MRSA PCR SCREENING     Status: None   Collection Time    11/05/13  1:28 PM      Result Value Ref Range Status   MRSA by PCR NEGATIVE  NEGATIVE Final   Comment:            The GeneXpert MRSA Assay (FDA     approved for NASAL specimens     only), is one component of a     comprehensive MRSA colonization     surveillance program. It is not     intended to diagnose MRSA     infection nor to guide or     monitor treatment for     MRSA infections.  SURGICAL PCR SCREEN     Status: None   Collection Time    11/06/13  8:13 AM      Result Value Ref Range Status   MRSA, PCR NEGATIVE  NEGATIVE Final   Staphylococcus aureus NEGATIVE  NEGATIVE Final   Comment:            The Xpert SA Assay (FDA     approved for NASAL specimens     in patients over 14 years of age),     is one component of     a comprehensive surveillance     program.  Test performance has     been validated by [employer] for patients greater     than or equal to 72 year old.     It is not intended     to diagnose infection nor to     guide or monitor treatment.  CULTURE, BLOOD (SINGLE)     Status: None   Collection Time    11/16/13  9:15 AM      Result Value Ref Range Status   Specimen Description BLOOD LEFT ARM   Final   Special Requests BOTTLES DRAWN AEROBIC ONLY 4CC   Final   Culture  Setup Time     Final   Value: 11/16/2013 15:20     Performed at [facility]   Culture     Final   Value: NO GROWTH 5 DAYS     Performed at [facility]   Report Status 11/22/2013 FINAL   Final  URINE CULTURE     Status: None   Collection Time    11/16/13  9:36 AM      Result Value Ref Range Status   Specimen Description URINE, CATHETERIZED   Final   Special Requests Normal   Final   Culture  Setup Time     Final   Value: 11/16/2013 19:34     Performed at [place]     Final   Value: NO GROWTH     Performed at [facility]   Culture     Final  Value: NO GROWTH     Performed at [facility]   Report Status 11/17/2013 FINAL   Final  CULTURE, RESPIRATORY (NON-EXPECTORATED)     Status: None   Collection Time    11/21/13  9:00 AM      Result Value Ref Range Status   Specimen Description TRACHEAL ASPIRATE   Final   Special Requests Normal   Final   Gram Stain     Final   Value: MODERATE WBC PRESENT,BOTH PMN AND MONONUCLEAR     RARE SQUAMOUS EPITHELIAL CELLS PRESENT     NO ORGANISMS SEEN     Performed at [facility]   Culture     Final   Value: FEW SERRATIA MARCESCENS     Performed at [facility]   Report Status PENDING   Incomplete   Organism ID, Bacteria SERRATIA MARCESCENS   Final  CULTURE, BLOOD (ROUTINE X 2)     Status: None   Collection Time    11/21/13  9:35 AM      Result Value Ref Range Status   Specimen Description BLOOD RIGHT ARM   Final   Special Requests BOTTLES DRAWN AEROBIC ONLY 5CC   Final   Culture  Setup Time     Final   Value: 11/21/2013 14:44     Performed at [facility]   Culture     Final   Value: SERRATIA MARCESCENS     Note: Gram Stain Report Called to,Read Back By and Verified With: JESSIE SUTTER 11/23/13 AT 0445 [place]     Performed at [facility]   Report Status PENDING   Incomplete   Organism ID, Bacteria SERRATIA MARCESCENS   Final  CULTURE, BLOOD (ROUTINE X 2)     Status: None   Collection Time    11/21/13  9:40 AM       Result Value Ref Range Status   Specimen Description BLOOD RIGHT FOREARM   Final   Special Requests BOTTLES DRAWN AEROBIC ONLY 5CC   Final   Culture  Setup Time     Final   Value: 11/21/2013 14:43     Performed at [facility]   Culture     Final   Value: SERRATIA MARCESCENS     Note: SUSCEPTIBILITIES PERFORMED ON PREVIOUS CULTURE WITHIN THE LAST 5 DAYS.     Note: Gram Stain Report Called to,Read Back By and Verified With: JESSIE SUTTER 11/23/13 AT 64 RIDK     Performed at [facility]   Report Status 11/25/2013 FINAL   Final  URINE CULTURE     Status: None   Collection Time    11/21/13 10:30 AM      Result Value Ref Range Status   Specimen Description URINE, CATHETERIZED   Final   Special Requests has completed 10 days of Ancef Normal   Final   Culture  Setup Time     Final   Value: 11/21/2013 17:18     Performed at [place]     Final   Value: 9,000 COLONIES/ML     Performed at [facility]   Culture     Final   Value: INSIGNIFICANT GROWTH     Performed at [facility]   Report Status 11/23/2013 FINAL   Final  CLOSTRIDIUM DIFFICILE BY PCR     Status: None   Collection Time    11/21/13  9:00 PM  Result Value Ref Range Status   C difficile by pcr NEGATIVE  NEGATIVE Final  BODY FLUID CULTURE     Status: None   Collection Time    11/22/13 11:02 AM      Result Value Ref Range Status   Specimen Description PLEURAL FLUID RIGHT   Final   Special Requests 60 ML FLUID   Final   Gram Stain     Final   Value: NO WBC SEEN     NO ORGANISMS SEEN     Performed at [facility]   Culture     Final   Value: NO GROWTH 2 DAYS     Performed at [facility]   Report Status PENDING   Incomplete    Medical History: Past Medical History  Diagnosis Date  . GERD (gastroesophageal reflux disease)   . PUD (peptic ulcer disease)   . Anemia   . Hypertension   . S/P endoscopy Aug 2011    3 superficial gastric  ulcers, NSAID-induced  . S/P colonoscopy Sept 2011    left-sided diverticula, tubular adenoma  . Coronary artery disease   . Shortness of breath   . Diabetes mellitus     insulin dependent  . Headache(784.0)     rare migraines  . Cancer     hx of skin cancer  . Arthritis   . Osteopenia   . Hypercholesterolemia   . SIADH (syndrome of inappropriate ADH production)   . CHF (congestive heart failure)   . Myocardial infarction 2013    Assessment: 80 year old female Pod 17 CABG with MVR Blood cultures and sputum culture positive for Serratia CrCl = 21 ml /min  Planning 14 days of treatment -- now transitioning from Zosyn to Cefepime Day 5 total of antibiotics  Goal of Therapy:  Appropriate dosing  Plan:  Cefepime 1gram iv Q 24 hours Follow up Scr, progress  Thank you. Anette Guarneri, PharmD [phone]  11/25/2013,10:20 AM  Change back to cefepime 2 grams iv Q 24

## 2013-11-25 NOTE — Progress Notes (Signed)
Subjective:   Patient seen this AM, no new complaints. In good spirits. Blood cultures and tracheal aspirate growing serratia marcescens.  Vancomycin discontinued yesterday.  Still w/ good UO, 3.5L in 24 hours. Weight decreased ~7 lbs. Cr still improving slowly, now 2.59.  Objective Vital signs in last 24 hours: Filed Vitals:   11/25/13 0347 11/25/13 0400 11/25/13 0500 11/25/13 0600  BP:  108/42 120/44 135/115  Pulse:  49 67 66  Temp: 97.6 F (36.4 C)     TempSrc: Oral     Resp:  15 15 15   Height:      Weight:    210 lb 12.2 oz (95.6 kg)  SpO2:  98% 100% 100%   Weight change: -7 lb 0.9 oz (-3.2 kg)  Intake/Output Summary (Last 24 hours) at 11/25/13 0727 Last data filed at 11/25/13 0700  Gross per 24 hour  Intake 2313.7 ml  Output   3535 ml  Net -1221.3 ml   Weight Trending 11/25/13 0600 95.6 kg (210 lb 12.2 oz) 11/24/13 0600 98.8 kg (217 lb 13 oz) 11/23/13 0500 101.3 kg (223 lb 5.2 oz) 11/22/13 0500 103 kg (227 lb 1.2 oz) 11/21/13 0500 104.5 kg (230 lb 6.1 oz) 11/20/13 0500 105.7 kg (233 lb 0.4 oz 11/19/13 106.4 kg (234 lb 9.1 oz)  (? Validity) 11/18/13 0500 103 kg (227 lb 1.2 oz) 11/17/13 0500 104.9 kg (231 lb 4.2 oz) 11/16/13 0630 110.1 kg (242 lb 11.6 oz)   11/15/13 0500 110.2 kg (242 lb 15.2 oz)  11/14/13 0645 110 kg (242 lb 8.1 oz) 11/13/13 0500 107.8 kg (237 lb 10.5 oz)  11/12/13 0500 105.3 kg (232 lb 2.3 oz)  11/11/13 0530 101.5 kg (223 lb 12.3 oz)  11/10/13 0630 104 kg (229 lb 4.5 oz)  11/09/13 0200 98.4 kg (216 lb 14.9 oz) 11/08/13 1924 86.7 kg (191 lb 2.2 oz) 11/05/13 1330 86.7 kg (191 lb 2.2 oz)  Pre op weight  Physical Exam:  General: Obese female, alert, cooperative, NAD. HEENT: PERRL, EOMI. Moist mucus membranes Neck: Full range of motion without pain, supple, no lymphadenopathy or carotid bruits Lungs: Clear to ascultation bilaterally, normal work of respiration, no wheezes, rales, rhonchi Heart: RRR (sinus), no murmurs, gallops, or  rubs Abdomen: Soft, non-tender, non-distended, BS + Extremities: +3 pitting edema extending to the hip. Still w/ mild improvement. No sacral edema. Neurologic: Alert & oriented X3, cranial nerves II-XII intact, strength grossly intact, sensation intact to light touch  Labs: Basic Metabolic Panel:  Recent Labs Lab 11/20/13 0411  11/23/13 0429 11/24/13 0250 11/25/13 0213  NA 121*  < > 122* 120* 123*  K 3.8  < > 3.6* 3.4* 3.9  CL 78*  < > 77* 75* 77*  CO2 27  < > 30 31 34*  GLUCOSE 134*  < > 117* 138* 144*  BUN 76*  < > 78* 73* 69*  CREATININE 2.82*  < > 2.79* 2.67* 2.59*  CALCIUM 8.5  < > 8.4 8.5 8.9  PHOS 5.3*  --   --   --   --   < > = values in this interval not displayed.   Recent Labs Lab 11/19/13 0420 11/20/13 0411 11/21/13 0253  AST 27 13 26   ALT 28 23 31   ALKPHOS 88 79 113  BILITOT 0.6 0.6 0.6  PROT 5.6* 5.7* 5.6*  ALBUMIN 2.4* 2.4* 2.3*    Recent Labs Lab 11/21/13 0253 11/22/13 0313 11/23/13 0429 11/24/13 0250 11/25/13 0213  WBC 30.8* 22.3* 14.5* 10.9*  11.0*  HGB 9.8* 9.5* 9.2* 9.8* 10.2*  HCT 28.2* 27.6* 26.3* 28.9* 29.3*  MCV 84.7 84.7 84.0 84.8 83.7  PLT 156 140* 153 177 167    Recent Labs Lab 11/24/13 0735 11/24/13 1147 11/24/13 1637 11/24/13 2318 11/25/13 0333  GLUCAP 118* 186* 178* 215* 151*    Medications: Infusions: . sodium chloride 10 mL/hr (11/23/13 1620)  . sodium chloride 250 mL (11/21/13 1541)  . amiodarone 30 mg/hr (11/25/13 0700)  . DOPamine 3 mcg/kg/min (11/25/13 0700)  . heparin 1,950 Units/hr (11/25/13 0600)    Scheduled Medications: . amiodarone  200 mg Oral BID  . bisacodyl  10 mg Oral Daily   Or  . bisacodyl  10 mg Rectal Daily  . darbepoetin (ARANESP) injection - NON-DIALYSIS  100 mcg Subcutaneous Q Wed-1800  . docusate  200 mg Oral Daily  . feeding supplement (GLUCERNA SHAKE)  237 mL Oral Q1500  . furosemide  160 mg Intravenous Q6H  . insulin aspart  0-24 Units Subcutaneous 6 times per day  . lactulose   20 g Oral Daily  . magic mouthwash  5 mL Oral TID  . metoprolol tartrate  12.5 mg Oral BID   Or  . metoprolol tartrate  12.5 mg Per Tube BID  . neomycin-bacitracin-polymyxin   Topical Daily  . nystatin cream   Topical TID  . pantoprazole  40 mg Oral Daily  . piperacillin-tazobactam (ZOSYN)  IV  3.375 g Intravenous 3 times per day  . potassium chloride  40 mEq Oral Daily  . sodium chloride  10-40 mL Intracatheter Q12H  . warfarin  5 mg Oral q1800  . Warfarin - Physician Dosing Inpatient   Does not apply q22    Background 80 year old female with multiple medical issues including stage III CKD who is status post CABG and mitral valve replacement with postoperative acute on chronic renal failure (baseline creatinine around 1.5), marked volume overload (50 lb +)  Assessment/Recommendations  Renal- Acute on chronic kidney disease in the setting of above. The insult was most likely hemodynamic in nature/ATN and not unusual in this scenario. Cr still improving, to 2.59 today. Still on Lasix 160 mg IV q6h, Metolazone stopped yesterday. Output of 3.5L since yesterday. HOC3 continues to increase despite holding metolazone in the setting of contraction alkalosis.   Hypertension/volume - Stable.  Continue diuretic program as above. Still diuresing well. Weight decreased 7 lbs since yesterday.  Serratia Bacteremia- Tracheal aspirate and blood cultures 2/2 positive for serratia marcescens. Transitioned from Zosyn to Cefepime. Vancomycin discontinued yesteday, WBC's 11.0 today. Will need 14 days total ABx (day 5/14). Anemia - Situational and CKD- continue weekly aranesp. Iron studies from 9/17 w/ iron of 19, ferritin of 604 and Tsat of 7%. Will consider iron load after infection issues addressed.  Hypokalemia -  K 3.9 this AM, On 40 mEq K daily currently, given an extra 40 mEq po yesterday afternoon. Continue to monitor closely. Hyponatremia- Dilutional and most likely due to volume overload. Improved to  123 today. S/p CABG/MVR AFib on heparin, BB, amio    Signed: Luanne Bras, MD 11/25/2013 7:27 AM

## 2013-11-25 NOTE — Progress Notes (Signed)
I saw the patient and agree with the above assessment and plan.     Pt w/ slow but clear evidence of renal recovery.  In good spirits this AM.  SNa is stable/ slightly improved. Developing an alkalosis, likely is contraction based.  Now off thiazide diuretic.  Remains on dopa gtt.  For now cont to follow on lasix only; might need to reduce dosing frequency.

## 2013-11-25 NOTE — Progress Notes (Signed)
[facility] BRIEF SICU PROGRESS NOTE  17 Days Post-Op  S/P Procedure(s) (LRB): CORONARY ARTERY BYPASS GRAFTING (CABG), on pump, times two, using left internal mammary artery, cryo saphenous vein. (N/A) INTRAOPERATIVE TRANSESOPHAGEAL ECHOCARDIOGRAM (N/A) MITRAL VALVE (MV) REPLACEMENT (N/A)   Stable day NSR w/ stable BP on low dose dopamine Ambulated a very short distance, very weak  Plan: Continue current plan  Claudia [name] 11/25/2013 5:51 PM

## 2013-11-25 NOTE — Progress Notes (Signed)
In to follow up with patient progress.  SNF level care is now indicated at discharge.  Progress is very and patient continues to have hypotensive episodes during ambulation.  Son in at bedside expressed no immediate concerns.  Will continue to monitor.  Of note, [facility] Care Management services does not replace or interfere with any services that are arranged by inpatient case management or social work.  For additional questions or referrals please contact Corliss Blacker BSN RN [facility] Liaison at [phone].

## 2013-11-25 NOTE — Progress Notes (Addendum)
[address]       [place] [address]             [phone]      17 Days Post-Op Procedure(s) (LRB): CORONARY ARTERY BYPASS GRAFTING (CABG), on pump, times two, using left internal mammary artery, cryo saphenous vein. (N/A) INTRAOPERATIVE TRANSESOPHAGEAL ECHOCARDIOGRAM (N/A) MITRAL VALVE (MV) REPLACEMENT (N/A)  Subjective:  Claudia [name] complains of abdominal discomfort.  She had 3 loose stools overnight, C. Diff has been negative.  Patient not willing to ambulate much and takes 3 person assist.  Objective: Vital signs in last 24 hours: Temp:  [97.3 F (36.3 C)-97.7 F (36.5 C)] 97.7 F (36.5 C) (09/21 0741) Pulse Rate:  [49-77] 66 (09/21 0600) Cardiac Rhythm:  [-] Normal sinus rhythm (09/21 0600) Resp:  [10-27] 15 (09/21 0600) BP: (92-135)/(39-115) 135/115 mmHg (09/21 0600) SpO2:  [91 %-100 %] 100 % (09/21 0600) Weight:  [210 lb 12.2 oz (95.6 kg)] 210 lb 12.2 oz (95.6 kg) (09/21 0600)  Intake/Output from previous day: 09/20 0701 - 09/21 0700 In: 2313.7 [P.O.:740; I.V.:1209.7; IV Piggyback:364] Out: 6195 [KDTOI:7124]  General appearance: alert, cooperative and no distress Heart: regular rate and rhythm Lungs: clear to auscultation bilaterally Abdomen: soft, non-tender; bowel sounds normal; no masses,  no organomegaly Extremities: edema 3+ Wound: clean and dry, staples, sutures remains in place  Lab Results:  Recent Labs  11/24/13 0250 11/25/13 0213  WBC 10.9* 11.0*  HGB 9.8* 10.2*  HCT 28.9* 29.3*  PLT 177 167   BMET:  Recent Labs  11/24/13 0250 11/25/13 0213  NA 120* 123*  K 3.4* 3.9  CL 75* 77*  CO2 31 34*  GLUCOSE 138* 144*  BUN 73* 69*  CREATININE 2.67* 2.59*  CALCIUM 8.5 8.9    PT/INR:  Recent Labs  11/25/13 0213  LABPROT 30.2*  INR 2.88*   ABG    Component Value Date/Time   PHART 7.318* 11/11/2013 1642   HCO3 18.5* 11/11/2013 1642   TCO2 20 11/11/2013 1642   ACIDBASEDEF 7.0* 11/11/2013 1642   O2SAT 60.2 11/21/2013  0420   CBG (last 3)   Recent Labs  11/24/13 1637 11/24/13 2318 11/25/13 0333  GLUCAP 178* 215* 151*    Assessment/Plan: S/P Procedure(s) (LRB): CORONARY ARTERY BYPASS GRAFTING (CABG), on pump, times two, using left internal mammary artery, cryo saphenous vein. (N/A) INTRAOPERATIVE TRANSESOPHAGEAL ECHOCARDIOGRAM (N/A) MITRAL VALVE (MV) REPLACEMENT (N/A)  1. CV-maintaining NSR, remains on Dopamine, Amiodarone, Lopressor 2. Pulm- off oxygen, continue IS, last CXR was 9/19 showed opacity in left base, will order repeat for AM 3. Renal- creatinine trending down, remains hypervolemic but has made progress weight is now about 20 lbs over baseline, Nephrology following 4. ID- +Serratia in blood cultures, and Resp cultures currently on Zosyn, may benefit from transition to Cefipime per sensitivities, leukocytosis improved, remains afebrile 5. INR 2.88 big jump from yesterday, will decrease Coumadin to 3 mg daily 6. DM- CBGs remain controlled 7. Deconditioning- patient refuses to ambulate per nursing staff, encouraged patient to ambulate TID, will need CIR vs. SNF at discharge 8. Dispo- patient slowly progressing, wean drips as tolerated, INR is therapeutic can d/c Heparin, ? Change Zosyn to Cefipime   LOS: 20 days    [name], Claudia 11/25/2013  Patient will need 2 weeks of IV antibiotic for positive blood culture with artificial MVR- serratia in tracheal aspirate and blood.Day 5 of 14 today CXR today is improved after L thoracentesis Stop iv heparin for INR .  2.5 and cont daily coumadin Stop iv amiodarone  patient examined and medical record reviewed,agree with above note. VAN TRIGT III,PETER 11/25/2013

## 2013-11-25 NOTE — Progress Notes (Signed)
Physical Therapy Treatment Patient Details Name: BRIDGIT EYNON MRN: [id number] DOB: 06-Feb-1932 Today's Date: 17-Dec-2013    History of Present Illness Pt adm with chest pain and underwent urgent CABG x 2 and MVR on 11/09/13. PMH - HTN, MI, DM    PT Comments    Pt making very slow progress. Had drop in BP with amb.  Follow Up Recommendations  SNF     Equipment Recommendations  None recommended by PT    Recommendations for Other Services OT consult     Precautions / Restrictions Precautions Precautions: Fall;Sternal Precaution Comments: check orthostatics Restrictions Weight Bearing Restrictions: No    Mobility  Bed Mobility                  Transfers Overall transfer level: Needs assistance Equipment used: Pushed w/c Transfers: Sit to/from Stand Sit to Stand: +2 physical assistance;Mod assist         General transfer comment: Verbal cues for hands on knees. Assist to bring hips up and pt with posterior lean.  Ambulation/Gait Ambulation/Gait assistance: +2 physical assistance;Mod assist;Min assist Ambulation Distance (Feet): 35 Feet Assistive device:  (pushing w/c) Gait Pattern/deviations: Step-through pattern;Decreased step length - right;Decreased step length - left;Trunk flexed;Shuffle Gait velocity: slow Gait velocity interpretation: Below normal speed for age/gender General Gait Details: Verbal cues to stand more erect, incr step length, and keep eyes open. Followed pt with a chair and pt began to have knees and hips buckling. Sat pt in chair and checked BP 96/38.    Stairs            Wheelchair Mobility    Modified Rankin (Stroke Patients Only)       Balance Overall balance assessment: Needs assistance Sitting-balance support: No upper extremity supported;Feet supported Sitting balance-Leahy Scale: Fair     Standing balance support: Bilateral upper extremity supported Standing balance-Leahy Scale: Poor Standing balance comment: Use  of walker and mod A to stand                    Cognition Arousal/Alertness: Awake/alert Behavior During Therapy: WFL for tasks assessed/performed Overall Cognitive Status: Within Functional Limits for tasks assessed                      Exercises      General Comments        Pertinent Vitals/Pain Pain Assessment: No/denies pain    Home Living                      Prior Function            PT Goals (current goals can now be found in the care plan section) Progress towards PT goals: Progressing toward goals    Frequency  Min 3X/week    PT Plan Current plan remains appropriate    Co-evaluation             End of Session Equipment Utilized During Treatment: Gait belt Activity Tolerance: Patient limited by fatigue;Other (comment) (orthostatic) Patient left: in chair;with call bell/phone within reach     Time: 0941-1004 PT Time Calculation (min): 23 min  Charges:  $Gait Training: 23-37 mins                    G Codes:      Annsleigh Dragoo 2013-12-17, 10:24 AM  Suanne Marker PT [phone]

## 2013-11-26 ENCOUNTER — Inpatient Hospital Stay (HOSPITAL_COMMUNITY): Payer: Medicare HMO

## 2013-11-26 LAB — CBC
HCT: 30.3 % — ABNORMAL LOW (ref 36.0–46.0)
Hemoglobin: 10.1 g/dL — ABNORMAL LOW (ref 12.0–15.0)
MCH: 28.5 pg (ref 26.0–34.0)
MCHC: 33.3 g/dL (ref 30.0–36.0)
MCV: 85.6 fL (ref 78.0–100.0)
Platelets: 174 10*3/uL (ref 150–400)
RBC: 3.54 MIL/uL — ABNORMAL LOW (ref 3.87–5.11)
RDW: 15.9 % — ABNORMAL HIGH (ref 11.5–15.5)
WBC: 9.2 10*3/uL (ref 4.0–10.5)

## 2013-11-26 LAB — BASIC METABOLIC PANEL
Anion gap: 15 (ref 5–15)
BUN: 64 mg/dL — ABNORMAL HIGH (ref 6–23)
CO2: 33 mEq/L — ABNORMAL HIGH (ref 19–32)
Calcium: 8.8 mg/dL (ref 8.4–10.5)
Chloride: 74 mEq/L — ABNORMAL LOW (ref 96–112)
Creatinine, Ser: 2.51 mg/dL — ABNORMAL HIGH (ref 0.50–1.10)
GFR calc Af Amer: 20 mL/min — ABNORMAL LOW (ref >90)
GFR calc non Af Amer: 17 mL/min — ABNORMAL LOW (ref >90)
Glucose, Bld: 150 mg/dL — ABNORMAL HIGH (ref 70–99)
Potassium: 3.8 mEq/L (ref 3.7–5.3)
Sodium: 122 mEq/L — ABNORMAL LOW (ref 137–147)

## 2013-11-26 LAB — GLUCOSE, CAPILLARY
GLUCOSE-CAPILLARY: 140 mg/dL — AB (ref 70–99)
GLUCOSE-CAPILLARY: 145 mg/dL — AB (ref 70–99)
GLUCOSE-CAPILLARY: 211 mg/dL — AB (ref 70–99)
Glucose-Capillary: 102 mg/dL — ABNORMAL HIGH (ref 70–99)
Glucose-Capillary: 120 mg/dL — ABNORMAL HIGH (ref 70–99)
Glucose-Capillary: 163 mg/dL — ABNORMAL HIGH (ref 70–99)
Glucose-Capillary: 201 mg/dL — ABNORMAL HIGH (ref 70–99)

## 2013-11-26 LAB — CULTURE, BLOOD (ROUTINE X 2)

## 2013-11-26 LAB — CULTURE, RESPIRATORY W GRAM STAIN: Special Requests: NORMAL

## 2013-11-26 LAB — BODY FLUID CULTURE
Culture: NO GROWTH
Gram Stain: NONE SEEN
Special Requests: 60

## 2013-11-26 LAB — MAGNESIUM: MAGNESIUM: 1.8 mg/dL (ref 1.5–2.5)

## 2013-11-26 LAB — PROTIME-INR
INR: 3.59 — ABNORMAL HIGH (ref 0.00–1.49)
Prothrombin Time: 35.8 seconds — ABNORMAL HIGH (ref 11.6–15.2)

## 2013-11-26 LAB — CULTURE, RESPIRATORY

## 2013-11-26 MED ORDER — DEXTROSE 5 % IV SOLN
1.0000 g | INTRAVENOUS | Status: DC
  Administered 2013-11-27: 1 g via INTRAVENOUS
  Filled 2013-11-26: qty 1

## 2013-11-26 MED ORDER — AMIODARONE HCL 200 MG PO TABS
200.0000 mg | ORAL_TABLET | Freq: Two times a day (BID) | ORAL | Status: DC
  Administered 2013-11-26 – 2013-12-02 (×12): 200 mg via ORAL
  Filled 2013-11-26 (×15): qty 1

## 2013-11-26 MED ORDER — CITALOPRAM HYDROBROMIDE 20 MG PO TABS
20.0000 mg | ORAL_TABLET | Freq: Every day | ORAL | Status: DC
Start: 2013-11-26 — End: 2013-12-13
  Administered 2013-11-26 – 2013-12-13 (×17): 20 mg via ORAL
  Filled 2013-11-26 (×18): qty 1

## 2013-11-26 MED ORDER — AMIODARONE HCL IN DEXTROSE 360-4.14 MG/200ML-% IV SOLN
30.0000 mg/h | INTRAVENOUS | Status: DC
  Administered 2013-11-26 (×2): 30 mg/h via INTRAVENOUS
  Filled 2013-11-26 (×4): qty 200

## 2013-11-26 MED ORDER — DIPHENOXYLATE-ATROPINE 2.5-0.025 MG PO TABS: 1.0000 | ORAL_TABLET | Freq: Four times a day (QID) | ORAL | Status: DC | PRN

## 2013-11-26 MED ORDER — AMIODARONE HCL IN DEXTROSE 360-4.14 MG/200ML-% IV SOLN
INTRAVENOUS | Status: AC
  Filled 2013-11-26: qty 200

## 2013-11-26 NOTE — Progress Notes (Signed)
Patient ID: Claudia [name], female   DOB: 26-Sep-1931, 80 y.o.   MRN: [id number] EVENING ROUNDS NOTE :     [address]       [place] [address]             [phone]                 18 Days Post-Op Procedure(s) (LRB): CORONARY ARTERY BYPASS GRAFTING (CABG), on pump, times two, using left internal mammary artery, cryo saphenous vein. (N/A) INTRAOPERATIVE TRANSESOPHAGEAL ECHOCARDIOGRAM (N/A) MITRAL VALVE (MV) REPLACEMENT (N/A)  Total Length of Stay:  LOS: 21 days  BP 108/44  Pulse 70  Temp(Src) 97.8 F (36.6 C) (Oral)  Resp 22  Ht 5\' 7"  (1.702 m)  Wt 204 lb 2.3 oz (92.6 kg)  BMI 31.97 kg/m2  SpO2 97%  .Intake/Output     09/22 0701 - 09/23 0700   P.O. 237   I.V. (mL/kg) 254.5 (2.7)   IV Piggyback 314   Total Intake(mL/kg) 805.5 (8.7)   Urine (mL/kg/hr) 1810 (1.5)   Total Output 1810   Net -1004.5         . sodium chloride 10 mL/hr at 11/26/13 1700  . sodium chloride 250 mL (11/21/13 1541)  . amiodarone 30 mg/hr (11/26/13 1700)  . DOPamine 2.5 mcg/kg/min (11/26/13 1700)     Lab Results  Component Value Date   WBC 9.2 11/26/2013   HGB 10.1* 11/26/2013   HCT 30.3* 11/26/2013   PLT 174 11/26/2013   GLUCOSE 150* 11/26/2013   CHOL 125 12/03/2012   TRIG 42 12/03/2012   HDL 72 12/03/2012   LDLCALC 45 12/03/2012   ALT 31 11/21/2013   AST 26 11/21/2013   NA 122* 11/26/2013   K 3.8 11/26/2013   CL 74* 11/26/2013   CREATININE 2.51* 11/26/2013   BUN 64* 11/26/2013   CO2 33* 11/26/2013   TSH 0.596 12/02/2012   INR 3.59* 11/26/2013   HGBA1C 7.2* 11/06/2013   In sinus rhythm currently  Grace Isaac MD  Beeper [phone] Office [phone] 11/26/2013 7:55 PM

## 2013-11-26 NOTE — Progress Notes (Signed)
Thank you for re-consult on Claudia [name]. Chart reviewed and note that multiple medical issues being addressed. Therapy notes reviewed over past two weeks and indicate very slow progress with poor tolerance of activity. Do not feel that patient would be able to tolerate 3 hrs/day of therapy on CIR and concur with SNF for follow up therapy once medically stable for discharge.

## 2013-11-26 NOTE — Progress Notes (Signed)
Subjective:   Patient seen this AM, complaining of some mild suprapubic discomfort and hesitancy. Foley draining well. States she feels lonely this morning. In Afib today.  Cr continues to improve, now 2.51.  Urine output 4.3L over past 24 hours. Na 122 today.  WBC's 9.2.  Objective Vital signs in last 24 hours: Filed Vitals:   11/26/13 0300 11/26/13 0400 11/26/13 0500 11/26/13 0600  BP: 112/81 107/65 96/50 104/53  Pulse: 89 112 102 107  Temp:  97.8 F (36.6 C)    TempSrc:  Oral    Resp: 14 16 12 13   Height:      Weight:      SpO2: 96% 93% 96% 96%   Weight change:   Intake/Output Summary (Last 24 hours) at 11/26/13 0715 Last data filed at 11/26/13 0600  Gross per 24 hour  Intake 843.38 ml  Output   4375 ml  Net -3531.62 ml   Weight Trending 11/26/13 0800 92.6 kg (204 lb 2.3 oz) 11/25/13 0600 95.6 kg (210 lb 12.2 oz) 11/24/13 0600 98.8 kg (217 lb 13 oz) 11/23/13 0500 101.3 kg (223 lb 5.2 oz) 11/22/13 0500 103 kg (227 lb 1.2 oz) 11/21/13 0500 104.5 kg (230 lb 6.1 oz) 11/20/13 0500 105.7 kg (233 lb 0.4 oz) 11/19/13 106.4 kg (234 lb 9.1 oz)  (? Validity) 11/18/13 0500 103 kg (227 lb 1.2 oz) 11/17/13 0500 104.9 kg (231 lb 4.2 oz) 11/16/13 0630 110.1 kg (242 lb 11.6 oz)   11/15/13 0500 110.2 kg (242 lb 15.2 oz)  11/14/13 0645 110 kg (242 lb 8.1 oz) 11/13/13 0500 107.8 kg (237 lb 10.5 oz)  11/12/13 0500 105.3 kg (232 lb 2.3 oz)  11/11/13 0530 101.5 kg (223 lb 12.3 oz)  11/10/13 0630 104 kg (229 lb 4.5 oz)  11/09/13 0200 98.4 kg (216 lb 14.9 oz) 11/08/13 1924 86.7 kg (191 lb 2.2 oz) 11/05/13 1330 86.7 kg (191 lb 2.2 oz)  Pre op weight  Physical Exam:  General: Obese female, alert, cooperative, NAD.  HEENT: PERRL, EOMI. Moist mucus membranes Neck: Full range of motion without pain, supple, no lymphadenopathy or carotid bruits Lungs: Clear to ascultation bilaterally, normal work of respiration, no wheezes, rales, rhonchi Heart: RRR (sinus), no murmurs, gallops, or  rubs Abdomen: Soft, non-tender, non-distended, BS + Extremities: +3 pitting edema extending to the hip. Still w/ mild improvement.  Neurologic: Alert & oriented X3, cranial nerves II-XII intact, strength grossly intact, sensation intact to light touch  Labs: Basic Metabolic Panel:  Recent Labs Lab 11/20/13 0411  11/24/13 0250 11/25/13 0213 11/26/13 0500  NA 121*  < > 120* 123* 122*  K 3.8  < > 3.4* 3.9 3.8  CL 78*  < > 75* 77* 74*  CO2 27  < > 31 34* 33*  GLUCOSE 134*  < > 138* 144* 150*  BUN 76*  < > 73* 69* 64*  CREATININE 2.82*  < > 2.67* 2.59* 2.51*  CALCIUM 8.5  < > 8.5 8.9 8.8  PHOS 5.3*  --   --   --   --   < > = values in this interval not displayed.   Recent Labs Lab 11/20/13 0411 11/21/13 0253  AST 13 26  ALT 23 31  ALKPHOS 79 113  BILITOT 0.6 0.6  PROT 5.7* 5.6*  ALBUMIN 2.4* 2.3*    Recent Labs Lab 11/22/13 0313 11/23/13 0429 11/24/13 0250 11/25/13 0213 11/26/13 0500  WBC 22.3* 14.5* 10.9* 11.0* 9.2  HGB 9.5* 9.2* 9.8* 10.2*  10.1*  HCT 27.6* 26.3* 28.9* 29.3* 30.3*  MCV 84.7 84.0 84.8 83.7 85.6  PLT 140* 153 177 167 174    Recent Labs Lab 11/25/13 1234 11/25/13 1656 11/25/13 2112 11/25/13 2334 11/26/13 0354  GLUCAP 179* 182* 139* 163* 140*    Medications: Infusions: . sodium chloride 10 mL/hr at 11/25/13 1400  . sodium chloride 250 mL (11/21/13 1541)  . DOPamine 2 mcg/kg/min (11/26/13 0600)    Scheduled Medications: . amiodarone  200 mg Oral BID  . bisacodyl  10 mg Oral Daily   Or  . bisacodyl  10 mg Rectal Daily  . ceFEPime (MAXIPIME) IV  2 g Intravenous Q24H  . darbepoetin (ARANESP) injection - NON-DIALYSIS  100 mcg Subcutaneous Q Wed-1800  . docusate  200 mg Oral Daily  . feeding supplement (GLUCERNA SHAKE)  237 mL Oral Q1500  . furosemide  160 mg Intravenous Q6H  . insulin aspart  0-24 Units Subcutaneous 6 times per day  . lactulose  20 g Oral Daily  . magic mouthwash  5 mL Oral TID  . metoprolol tartrate  12.5 mg Oral  BID   Or  . metoprolol tartrate  12.5 mg Per Tube BID  . neomycin-bacitracin-polymyxin   Topical Daily  . nystatin cream   Topical TID  . pantoprazole  40 mg Oral Daily  . potassium chloride  40 mEq Oral Daily  . sodium chloride  10-40 mL Intracatheter Q12H  . warfarin  2.5 mg Oral q1800  . Warfarin - Physician Dosing Inpatient   Does not apply q19    Background 80 year old female with multiple medical issues including stage III CKD who is status post CABG and mitral valve replacement with postoperative acute on chronic renal failure (baseline creatinine around 1.5), marked volume overload (50 lb +)  Assessment/Recommendations  Renal- Acute on chronic kidney disease in the setting of above. Insult was most likely hemodynamic in nature/ATN in the setting of CABG. Cr still improving, to 2.51 today. On Lasix 160 mg IV q6h, Metolazone discontinued 11/24/13. Output of 4.3L since yesterday. HOC3 33, likely in the setting of contraction alkalosis. Feel it is reasonable to discontinue dopamine gtt at this time.  Hypertension/volume- Stable. Continue diuretic program as above. Still diuresing well.  Serratia Bacteremia- Tracheal aspirate and blood cultures 2/2 positive for serratia marcescens. Transitioned from Zosyn to Cefepime on 11/25/13, WBC's 9.2 today. Will need 14 days total ABx (day 6/14). Anemia- Situational and CKD- continue weekly aranesp. Iron studies from 9/17 w/ iron of 19, ferritin of 604 and Tsat of 7%. Will consider iron load after infection issues addressed.  Hypokalemia-  K 3.8 this AM, On 40 mEq K daily. Continue to monitor closely. Hyponatremia- Dilutional and most likely due to volume overload. 122 today. S/p CABG/MVR AFib on heparin, BB, amio po- Amiodarone gtt discontinued yesterday. In atrial fibrillation this AM, rate in the low 100's.     Signed: Luanne Bras, MD 11/26/2013 7:15 AM

## 2013-11-26 NOTE — Progress Notes (Signed)
Chaplain visited with Claudia [name] responding to a consult but she was asleep. I will let her rest. Will return tomorrow morning.   Page if needed. Delford Field 11/26/2013 4:33 PM [phone]

## 2013-11-26 NOTE — Progress Notes (Signed)
I saw the patient and agree with the above assessment and plan.    Patient has done well from a renal perspective in the past 24 hours. She continues to be quite responsive to Lasix and serum creatinine continues to improve.  She remains on low-dose dopamine and I am not sure if this aiding her recovery.  She has persistent hyponatremia with a serum sodium in the low 120s. She is fluid restricted and off the thiazide diuretic. I am surprised that this has persisted to this point. I do not believe there is surreptitious water intake. She is asymptomatic at the current time and we will continue to closely follow.

## 2013-11-26 NOTE — Progress Notes (Signed)
Agree with the above

## 2013-11-26 NOTE — Progress Notes (Addendum)
NUTRITION FOLLOW UP  Intervention: Continue Glucerna Shake po daily, each supplement provides 350 kcal and 13 grams of protein RD to follow for nutrition care plan  Nutrition Dx: Increased nutrient needs related to post-op healing as evidenced by estimated nutrition needs, ongoing  Goal: Pt to meet >/= 90% of their estimated nutrition needs, unmet  ASSESSMENT: 80 year old female with extensive cardiac history, admitted on 9/1 with chest pain.   Patient s/p procedures 9/5: CORONARY ARTERY BYPASS GRAFTING x 2 MITRAL VALVE REPLACEMENT   Patient extubated 9/6.  Patient sleeping soundly upon RD visit.  Continues on a Renal/Carbohydrate Modified diet.  Per RN, not eating well.  Doesn't like the hospital food.  PO intake at 25% per flowsheet records.  + diarrhea.  Has Glucerna Shake supplement ordered.  Per MAR, pt is being given, however, unsure if she is drinking.  Height: Ht Readings from Last 1 Encounters:  11/05/13 5\' 7"  (1.702 m)    Weight: Wt Readings from Last 1 Encounters:  11/26/13 204 lb 2.3 oz (92.6 kg)    BMI:  Body mass index is 31.97 kg/(m^2).    Estimated Nutritional Needs: Kcal: 1700-1900 Protein: 90-100 gm  Fluid: 1.7-1.9 L  Skin: surgical incisions to legs and chest  Diet Order: Diabetic   Intake/Output Summary (Last 24 hours) at 11/26/13 1309 Last data filed at 11/26/13 1200  Gross per 24 hour  Intake  635.6 ml  Output   4110 ml  Net -3474.4 ml    Labs:   Recent Labs Lab 11/20/13 0411  11/24/13 0250 11/25/13 0213 11/26/13 0500  NA 121*  < > 120* 123* 122*  K 3.8  < > 3.4* 3.9 3.8  CL 78*  < > 75* 77* 74*  CO2 27  < > 31 34* 33*  BUN 76*  < > 73* 69* 64*  CREATININE 2.82*  < > 2.67* 2.59* 2.51*  CALCIUM 8.5  < > 8.5 8.9 8.8  MG  --   --   --   --  1.8  PHOS 5.3*  --   --   --   --   GLUCOSE 134*  < > 138* 144* 150*  < > = values in this interval not displayed.  CBG (last 3)   Recent Labs  11/26/13 0354 11/26/13 0736  11/26/13 1126  GLUCAP 140* 145* 201*    Scheduled Meds: . amiodarone      . bisacodyl  10 mg Oral Daily   Or  . bisacodyl  10 mg Rectal Daily  . [START ON 11/27/2013] ceFEPime (MAXIPIME) IV  1 g Intravenous Q24H  . citalopram  20 mg Oral Daily  . darbepoetin (ARANESP) injection - NON-DIALYSIS  100 mcg Subcutaneous Q Wed-1800  . docusate  200 mg Oral Daily  . feeding supplement (GLUCERNA SHAKE)  237 mL Oral Q1500  . furosemide  160 mg Intravenous Q6H  . insulin aspart  0-24 Units Subcutaneous 6 times per day  . magic mouthwash  5 mL Oral TID  . metoprolol tartrate  12.5 mg Oral BID   Or  . metoprolol tartrate  12.5 mg Per Tube BID  . neomycin-bacitracin-polymyxin   Topical Daily  . nystatin cream   Topical TID  . pantoprazole  40 mg Oral Daily  . potassium chloride  40 mEq Oral Daily  . sodium chloride  10-40 mL Intracatheter Q12H  . Warfarin - Physician Dosing Inpatient   Does not apply q1800    Continuous Infusions: .  sodium chloride 10 mL/hr at 11/25/13 1400  . sodium chloride 250 mL (11/21/13 1541)  . amiodarone 30 mg/hr (11/26/13 1200)  . DOPamine 2.5 mcg/kg/min (11/26/13 1200)    Past Medical History  Diagnosis Date  . GERD (gastroesophageal reflux disease)   . PUD (peptic ulcer disease)   . Anemia   . Hypertension   . S/P endoscopy Aug 2011    3 superficial gastric ulcers, NSAID-induced  . S/P colonoscopy Sept 2011    left-sided diverticula, tubular adenoma  . Coronary artery disease   . Shortness of breath   . Diabetes mellitus     insulin dependent  . Headache(784.0)     rare migraines  . Cancer     hx of skin cancer  . Arthritis   . Osteopenia   . Hypercholesterolemia   . SIADH (syndrome of inappropriate ADH production)   . CHF (congestive heart failure)   . Myocardial infarction 2013    Past Surgical History  Procedure Laterality Date  . Tonsillectomy    . Appendectomy    . Eye surgery      cataracts  . Cardiac stents  07/19/2011  .  Esophagogastroduodenoscopy  10/16/09    normal without barrett's/three superficial gastric ulcers  . Colonoscopy  12/04/09    normal rectum/left-sided diverticula  . Joint replacement  arthroscopy to knee  . Toe amputation Left 2013    left second toe amputation  . Cardiac catheterization  01/22/2013  . Coronary angioplasty  07/2011  . Coronary artery bypass graft N/A 11/08/2013    Procedure: CORONARY ARTERY BYPASS GRAFTING (CABG), on pump, times two, using left internal mammary artery, cryo saphenous vein.;  Surgeon: Ivin Poot, MD;  Location: [facility];  Service: Open Heart Surgery;  Laterality: N/A;  LIMA-LAD CRYOVEIN -OM  . Intraoperative transesophageal echocardiogram N/A 11/08/2013    Procedure: INTRAOPERATIVE TRANSESOPHAGEAL ECHOCARDIOGRAM;  Surgeon: Ivin Poot, MD;  Location: [facility];  Service: Open Heart Surgery;  Laterality: N/A;  . Mitral valve replacement N/A 11/08/2013    Procedure: MITRAL VALVE (MV) REPLACEMENT;  Surgeon: Ivin Poot, MD;  Location: [facility];  Service: Open Heart Surgery;  Laterality: N/A;  #25 MAGNA MITRAL EASE    Arthur Holms, RD, LDN Pager #: [phone] After-Hours Pager #: [phone]

## 2013-11-26 NOTE — Progress Notes (Addendum)
[address]       [place] [address]             [phone]      18 Days Post-Op Procedure(s) (LRB): CORONARY ARTERY BYPASS GRAFTING (CABG), on pump, times two, using left internal mammary artery, cryo saphenous vein. (N/A) INTRAOPERATIVE TRANSESOPHAGEAL ECHOCARDIOGRAM (N/A) MITRAL VALVE (MV) REPLACEMENT (N/A)  Subjective:  Ms. Claudia [name] states she had a rough night last night.  She had multiple episodes of diarrhea.  She states walking did not go well yesterday.  She states she is just unmotivated and doesn't want to walk.  She also seems depressed.  I encouraged the patient that I know she has had a long post operative recovery but she is making progress.  I also explained the importance of ambulating at least 3x per day.  She states she was agreeable to try.  Objective: Vital signs in last 24 hours: Temp:  [97.3 F (36.3 C)-97.9 F (36.6 C)] 97.3 F (36.3 C) (09/22 0700) Pulse Rate:  [71-112] 99 (09/22 0700) Cardiac Rhythm:  [-] Atrial fibrillation (09/22 0600) Resp:  [9-32] 30 (09/22 0700) BP: (96-125)/(38-94) 100/55 mmHg (09/22 0700) SpO2:  [90 %-100 %] 92 % (09/22 0700)  Intake/Output from previous day: 09/21 0701 - 09/22 0700 In: 856.7 [P.O.:240; I.V.:450.7; IV Piggyback:166] Out: 5462 [Urine:4375]  General appearance: alert, cooperative and no distress Heart: irregularly irregular rhythm Lungs: diminished breath sounds left base Abdomen: soft, non-tender; bowel sounds normal; no masses,  no organomegaly Extremities: edema 1-2+ Wound: clean, some serous drainage present likely due to edema, no evidence of infection  Lab Results:  Recent Labs  11/25/13 0213 11/26/13 0500  WBC 11.0* 9.2  HGB 10.2* 10.1*  HCT 29.3* 30.3*  PLT 167 174   BMET:  Recent Labs  11/25/13 0213 11/26/13 0500  NA 123* 122*  K 3.9 3.8  CL 77* 74*  CO2 34* 33*  GLUCOSE 144* 150*  BUN 69* 64*  CREATININE 2.59* 2.51*  CALCIUM 8.9 8.8    PT/INR:  Recent  Labs  11/26/13 0500  LABPROT 35.8*  INR 3.59*   ABG    Component Value Date/Time   PHART 7.318* 11/11/2013 1642   HCO3 18.5* 11/11/2013 1642   TCO2 20 11/11/2013 1642   ACIDBASEDEF 7.0* 11/11/2013 1642   O2SAT 60.2 11/21/2013 0420   CBG (last 3)   Recent Labs  11/25/13 2112 11/25/13 2334 11/26/13 0354  GLUCAP 139* 163* 140*    Assessment/Plan: S/P Procedure(s) (LRB): CORONARY ARTERY BYPASS GRAFTING (CABG), on pump, times two, using left internal mammary artery, cryo saphenous vein. (N/A) INTRAOPERATIVE TRANSESOPHAGEAL ECHOCARDIOGRAM (N/A) MITRAL VALVE (MV) REPLACEMENT (N/A)  1. CV- converted to Atrial Fibrillation overnight- on Amiodarone 200 mg BID, on Lopressor at 12.5 mg BID, however pressure is labile would likely not tolerate an increase in Beta Blocker, remains on Dopamine 2. Pulm- off oxygen, CXR with worsening left lung opacity pleural fluid vs atelectasis, continue IS however may benefit from Thoracentesis 3. Renal- creatinine continues to improve, remains hyponatremic, good U/O with Lasix, Nephrology following 4. ID- + Serratia Bacteremia, continue Cefipime 5. INR 3.59, received decrease dose of Coumadin yesterday, but will hold dose until INR is trending back down 6. DM- CBGS- controlled continue current regimen 7. Deconditioning- declined by CIR, will need SNF once medically stable for discharge, however patient has little motivation to ambulate 8. Dispo- patient depressed may benefit from starting low dose anti-depressant, INR supratherapeutic will hold Coumadin tonight,  diarrhea will hold all stool softners C. Diff has been negative, questionable left sided pleural effusion may benefit from Thoracentesis, continue current care    LOS: 21 days    BARRETT, ERIN 11/26/2013  Not candidate for CIR- will start celexa Back in Afib- INR >3--will add iv amio back Gram negative pneumonia with positive blood cultures Renal status slowly improving- cont renal dopamine until  creat 2.0 Patient had L thoracentesis 4 days ago- 500cc

## 2013-11-26 NOTE — Progress Notes (Signed)
CSW covering 2S today- notified by [facility] of probable need for SNF placement.  Patient lives in [place], [place]. She was assessed by Poonum Ambelal, LCSWA on 11/15/13.  CSW unable to see today- will ask weekend coverage to follow up on patient to determine d/c disposition.  Lorie Phenix. Pauline Good, [place]

## 2013-11-27 ENCOUNTER — Inpatient Hospital Stay (HOSPITAL_COMMUNITY): Payer: Medicare HMO

## 2013-11-27 LAB — CBC
HCT: 32.1 % — ABNORMAL LOW (ref 36.0–46.0)
Hemoglobin: 10.5 g/dL — ABNORMAL LOW (ref 12.0–15.0)
MCH: 29 pg (ref 26.0–34.0)
MCHC: 32.7 g/dL (ref 30.0–36.0)
MCV: 88.7 fL (ref 78.0–100.0)
Platelets: 177 10*3/uL (ref 150–400)
RBC: 3.62 MIL/uL — ABNORMAL LOW (ref 3.87–5.11)
RDW: 16.1 % — ABNORMAL HIGH (ref 11.5–15.5)
WBC: 8.8 10*3/uL (ref 4.0–10.5)

## 2013-11-27 LAB — GLUCOSE, CAPILLARY
GLUCOSE-CAPILLARY: 168 mg/dL — AB (ref 70–99)
Glucose-Capillary: 146 mg/dL — ABNORMAL HIGH (ref 70–99)
Glucose-Capillary: 148 mg/dL — ABNORMAL HIGH (ref 70–99)
Glucose-Capillary: 159 mg/dL — ABNORMAL HIGH (ref 70–99)
Glucose-Capillary: 198 mg/dL — ABNORMAL HIGH (ref 70–99)
Glucose-Capillary: 89 mg/dL (ref 70–99)

## 2013-11-27 LAB — BASIC METABOLIC PANEL
Anion gap: 18 — ABNORMAL HIGH (ref 5–15)
BUN: 59 mg/dL — ABNORMAL HIGH (ref 6–23)
CO2: 31 mEq/L (ref 19–32)
Calcium: 8.8 mg/dL (ref 8.4–10.5)
Chloride: 75 mEq/L — ABNORMAL LOW (ref 96–112)
Creatinine, Ser: 2.4 mg/dL — ABNORMAL HIGH (ref 0.50–1.10)
GFR calc Af Amer: 21 mL/min — ABNORMAL LOW (ref >90)
GFR calc non Af Amer: 18 mL/min — ABNORMAL LOW (ref >90)
Glucose, Bld: 157 mg/dL — ABNORMAL HIGH (ref 70–99)
Potassium: 3.9 mEq/L (ref 3.7–5.3)
Sodium: 124 mEq/L — ABNORMAL LOW (ref 137–147)

## 2013-11-27 LAB — PROTIME-INR
INR: 2.84 — ABNORMAL HIGH (ref 0.00–1.49)
Prothrombin Time: 29.8 seconds — ABNORMAL HIGH (ref 11.6–15.2)

## 2013-11-27 MED ORDER — DEXTROSE 5 % IV SOLN
2.0000 g | INTRAVENOUS | Status: DC
  Administered 2013-11-28 – 2013-12-05 (×8): 2 g via INTRAVENOUS
  Filled 2013-11-27 (×8): qty 2

## 2013-11-27 MED ORDER — WARFARIN SODIUM 2.5 MG PO TABS
2.5000 mg | ORAL_TABLET | Freq: Once | ORAL | Status: AC
  Administered 2013-11-27: 2.5 mg via ORAL
  Filled 2013-11-27: qty 1

## 2013-11-27 NOTE — Progress Notes (Signed)
I saw the patient and agree with the above assessment and plan.    Patient continues to adequately diurese and improve her serum creatinine. She is approaching her preoperative weight.  Her serum sodium continues to slowly improve. Will make no changes to diuretic regimen for the next 24 hours.  We will continue to closely follow.

## 2013-11-27 NOTE — Progress Notes (Signed)
Resting  BP 105/59  Pulse 96  Temp(Src) 98.1 F (36.7 C) (Oral)  Resp 16  Ht 5\' 7"  (1.702 m)  Wt 201 lb 8 oz (91.4 kg)  BMI 31.55 kg/m2  SpO2 95%   Intake/Output Summary (Last 24 hours) at 11/27/13 2017 Last data filed at 11/27/13 1900  Gross per 24 hour  Intake  838.7 ml  Output   2395 ml  Net -1556.3 ml    Currently in SR

## 2013-11-27 NOTE — Progress Notes (Signed)
Subjective:   Patient seen this AM, no complaints today. Feeling quite well, no stomach upset or nausea. LE edema improved.  3.2L urine output overnight.  Cr continues to improve, 2.40 this AM.  ~202 lbs today, decreased 3 lbs since yesterday. 3.2L urine output overnight.  Na 124 today.  Restarted on Amiodarone gtt yesterday, now in NSR.   Objective Vital signs in last 24 hours: Filed Vitals:   11/27/13 0600 11/27/13 0645 11/27/13 0700 11/27/13 0730  BP: 127/47  117/45   Pulse: 71  72   Temp:    97.9 F (36.6 C)  TempSrc:    Oral  Resp: 15  12   Height:      Weight:  201 lb 8 oz (91.4 kg)    SpO2: 93%  97%    Weight change:   Intake/Output Summary (Last 24 hours) at 11/27/13 0801 Last data filed at 11/27/13 0700  Gross per 24 hour  Intake 1335.37 ml  Output   3160 ml  Net -1824.63 ml   Weight Trending 11/27/13 0645 91.4 kg (201 lb 8 oz) 11/26/13 0800 92.6 kg (204 lb 2.3 oz) 11/25/13 0600 95.6 kg (210 lb 12.2 oz) 11/24/13 0600 98.8 kg (217 lb 13 oz) 11/23/13 0500 101.3 kg (223 lb 5.2 oz) 11/22/13 0500 103 kg (227 lb 1.2 oz) 11/21/13 0500 104.5 kg (230 lb 6.1 oz) 11/20/13 0500 105.7 kg (233 lb 0.4 oz) 11/19/13 106.4 kg (234 lb 9.1 oz)  (? Validity) 11/18/13 0500 103 kg (227 lb 1.2 oz) 11/17/13 0500 104.9 kg (231 lb 4.2 oz) 11/16/13 0630 110.1 kg (242 lb 11.6 oz)   11/15/13 0500 110.2 kg (242 lb 15.2 oz)  11/14/13 0645 110 kg (242 lb 8.1 oz) 11/13/13 0500 107.8 kg (237 lb 10.5 oz)  11/12/13 0500 105.3 kg (232 lb 2.3 oz)  11/11/13 0530 101.5 kg (223 lb 12.3 oz)  11/10/13 0630 104 kg (229 lb 4.5 oz)  11/09/13 0200 98.4 kg (216 lb 14.9 oz) 11/08/13 1924 86.7 kg (191 lb 2.2 oz) 11/05/13 1330 86.7 kg (191 lb 2.2 oz)  Pre op weight  Physical Exam:  General: Obese female, alert, cooperative, NAD.  HEENT: PERRL, EOMI. Moist mucus membranes Neck: Full range of motion without pain, supple, no lymphadenopathy or carotid bruits Lungs: Clear to ascultation bilaterally,  normal work of respiration, no wheezes, rales, rhonchi Heart: RRR, no murmurs, gallops, or rubs Abdomen: Soft, non-tender, non-distended, BS + Extremities: +3 pitting edema extending to the thigh. Improved.  Neurologic: Alert & oriented X3, cranial nerves II-XII intact, strength grossly intact, sensation intact to light touch  Labs: Basic Metabolic Panel:  Recent Labs Lab 11/25/13 0213 11/26/13 0500 11/27/13 0542  NA 123* 122* 124*  K 3.9 3.8 3.9  CL 77* 74* 75*  CO2 34* 33* 31  GLUCOSE 144* 150* 157*  BUN 69* 64* 59*  CREATININE 2.59* 2.51* 2.40*  CALCIUM 8.9 8.8 8.8     Recent Labs Lab 11/21/13 0253  AST 26  ALT 31  ALKPHOS 113  BILITOT 0.6  PROT 5.6*  ALBUMIN 2.3*    Recent Labs Lab 11/23/13 0429 11/24/13 0250 11/25/13 0213 11/26/13 0500 11/27/13 0542  WBC 14.5* 10.9* 11.0* 9.2 8.8  HGB 9.2* 9.8* 10.2* 10.1* 10.5*  HCT 26.3* 28.9* 29.3* 30.3* 32.1*  MCV 84.0 84.8 83.7 85.6 88.7  PLT 153 177 167 174 177    Recent Labs Lab 11/26/13 1126 11/26/13 1530 11/26/13 1913 11/26/13 2327 11/27/13 0405  GLUCAP 201* 211* 102* 120*  146*    Medications: Infusions: . sodium chloride 10 mL/hr at 11/27/13 0400  . sodium chloride 250 mL (11/21/13 1541)  . amiodarone 30 mg/hr (11/27/13 0400)  . DOPamine 2.5 mcg/kg/min (11/27/13 0400)    Scheduled Medications: . amiodarone  200 mg Oral BID  . bisacodyl  10 mg Oral Daily   Or  . bisacodyl  10 mg Rectal Daily  . ceFEPime (MAXIPIME) IV  1 g Intravenous Q24H  . citalopram  20 mg Oral Daily  . darbepoetin (ARANESP) injection - NON-DIALYSIS  100 mcg Subcutaneous Q Wed-1800  . docusate  200 mg Oral Daily  . feeding supplement (GLUCERNA SHAKE)  237 mL Oral Q1500  . furosemide  160 mg Intravenous Q6H  . insulin aspart  0-24 Units Subcutaneous 6 times per day  . magic mouthwash  5 mL Oral TID  . metoprolol tartrate  12.5 mg Oral BID   Or  . metoprolol tartrate  12.5 mg Per Tube BID  .  neomycin-bacitracin-polymyxin   Topical Daily  . nystatin cream   Topical TID  . pantoprazole  40 mg Oral Daily  . potassium chloride  40 mEq Oral Daily  . sodium chloride  10-40 mL Intracatheter Q12H  . Warfarin - Physician Dosing Inpatient   Does not apply q5    Background 80 year old female with multiple medical issues including stage III CKD who is status post CABG and mitral valve replacement with postoperative acute on chronic renal failure (baseline creatinine around 1.5), marked volume overload (50 lb +)  Assessment/Recommendations  Renal- Acute on chronic kidney disease in the setting of above. Insult was most likely hemodynamic in nature/ATN in the setting of CABG. Cr still improving, to 2.40 today. On Lasix 160 mg IV q6h. Output of 3.2L since yesterday. HOC3 31. Consider decreasing Lasix dose in next 1-3 days depending on weight change and Cr change.  Hypertension/volume- Stable. Continue diuretic program as above. Still diuresing well.  Serratia Bacteremia- Tracheal aspirate and blood cultures 2/2 positive for serratia marcescens. Transitioned from Zosyn to Cefepime on 11/25/13, WBC's 8.8 today. Day 7/14 of ABx.  Anemia- Situational and CKD- continue weekly aranesp. Hb 10.5 this AM, close to baseline. Iron studies from 9/17 w/ iron of 19, ferritin of 604 and Tsat of 7%.  Hypokalemia-  K 3.9 this AM, On 40 mEq K daily. Continue to monitor closely. Hyponatremia- Dilutional and most likely due to volume overload. Improved to 124 today. S/p CABG/MVR AFib on heparin, BB, amio po- Amiodarone gtt restarted yesterday, patient NSR this AM.     Signed: Luanne Bras, MD 11/27/2013 8:01 AM

## 2013-11-27 NOTE — Progress Notes (Addendum)
      [address]       [place] [address]             [phone]      19 Days Post-Op Procedure(s) (LRB): CORONARY ARTERY BYPASS GRAFTING (CABG), on pump, times two, using left internal mammary artery, cryo saphenous vein. (N/A) INTRAOPERATIVE TRANSESOPHAGEAL ECHOCARDIOGRAM (N/A) MITRAL VALVE (MV) REPLACEMENT (N/A)  Subjective:  Ms. Pellegrin states she is feeling better this morning.  She states she still has episodes of diarrhea.  She did not walk yesterday stating that she felt too sick.  She is willing to attempt to ambulate today.  Objective: Vital signs in last 24 hours: Temp:  [97.1 F (36.2 C)-97.9 F (36.6 C)] 97.9 F (36.6 C) (09/23 0730) Pulse Rate:  [48-100] 72 (09/23 0800) Cardiac Rhythm:  [-] Normal sinus rhythm (09/23 0800) Resp:  [11-26] 11 (09/23 0800) BP: (91-127)/(44-85) 115/47 mmHg (09/23 0800) SpO2:  [91 %-100 %] 100 % (09/23 0800) Weight:  [201 lb 8 oz (91.4 kg)] 201 lb 8 oz (91.4 kg) (09/23 0645)  Intake/Output from previous day: 09/22 0701 - 09/23 0700 In: 1348.7 [P.O.:237; I.V.:665.7; IV Piggyback:446] Out: 3310 [Urine:3310] Intake/Output this shift: Total I/O In: 96.8 [I.V.:30.8; IV Piggyback:66] Out: 170 [Urine:170]  General appearance: alert, cooperative and no distress Heart: regular rate and rhythm Lungs: diminished breath sounds bibasilar Abdomen: soft, non-tender; bowel sounds normal; no masses,  no organomegaly Extremities: edema 2+ Wound: clean and dry  Lab Results:  Recent Labs  11/26/13 0500 11/27/13 0542  WBC 9.2 8.8  HGB 10.1* 10.5*  HCT 30.3* 32.1*  PLT 174 177   BMET:  Recent Labs  11/26/13 0500 11/27/13 0542  NA 122* 124*  K 3.8 3.9  CL 74* 75*  CO2 33* 31  GLUCOSE 150* 157*  BUN 64* 59*  CREATININE 2.51* 2.40*  CALCIUM 8.8 8.8    PT/INR:  Recent Labs  11/27/13 0542  LABPROT 29.8*  INR 2.84*   ABG    Component Value Date/Time   PHART 7.318* 11/11/2013 1642   HCO3 18.5* 11/11/2013  1642   TCO2 20 11/11/2013 1642   ACIDBASEDEF 7.0* 11/11/2013 1642   O2SAT 60.2 11/21/2013 0420   CBG (last 3)   Recent Labs  11/26/13 2327 11/27/13 0405 11/27/13 0728  GLUCAP 120* 146* 168*    Assessment/Plan: S/P Procedure(s) (LRB): CORONARY ARTERY BYPASS GRAFTING (CABG), on pump, times two, using left internal mammary artery, cryo saphenous vein. (N/A) INTRAOPERATIVE TRANSESOPHAGEAL ECHOCARDIOGRAM (N/A) MITRAL VALVE (MV) REPLACEMENT (N/A)  1. CV- Previous A. Fib, currently NSR- transitioned off IV Amiodarone per nursing staff, continue Lopressor and oral regimen of Amiodarone- remains on Dopamine 2. Pulm- off oxygen, continued atelectasis bibasilar, left sided pleural effusion will follow 3. Renal- creatinine trending down, remains hypervolemia continue diuresis, Nephrology following, they state not sure Dopamine is aiding in her recovery 4. ID- +Serratia Bacteremia, continue Cefipime 5. INR 2.84, will restart coumadin at 2.5 mg daily 6. DM- cbgs controlled, continue current regimen 7. Deconditioning- will need SNF at discharge 8. Post operative depression- Celexa started yesterday 9. Dispo- patient progressing slowly, needs to ambulate, will resume low dose Coumadin, monitor left sided pleural effusion, continue current care   LOS: 22 days    BARRETT, ERIN 11/27/2013 Patient brighter today Stop IV amiodarone Leave in ICU for sepsis, ATN, CHF on dopamone Low dose coumadin today

## 2013-11-27 NOTE — Plan of Care (Signed)
Problem: Phase II - Intermediate Post-Op Goal: Patient advanced to Phase III: Barriers addressed Walked 20 feet with PT

## 2013-11-27 NOTE — Progress Notes (Signed)
Physical Therapy Treatment Patient Details Name: Claudia [name] MRN: [id number] DOB: [birth year] Today's Date: December 06, 2013    History of Present Illness Pt adm with chest pain and underwent urgent CABG x 2 and MVR on 11/09/13. PMH - HTN, MI, DM    PT Comments    Pt orthostatic with amb.  Follow Up Recommendations  SNF     Equipment Recommendations  None recommended by PT    Recommendations for Other Services       Precautions / Restrictions Precautions Precautions: Fall;Sternal Precaution Comments: orthostatic    Mobility  Bed Mobility                  Transfers Overall transfer level: Needs assistance Equipment used: Pushed w/c Transfers: Sit to/from Stand Sit to Stand: +2 physical assistance;Mod assist         General transfer comment: Verbal cues for hands on knees. Assist to bring hips up and pt with posterior lean.  Ambulation/Gait Ambulation/Gait assistance: +2 physical assistance;Mod assist (3rd person following with chair.) Ambulation Distance (Feet): 15 Feet Assistive device:  (pushing w/c) Gait Pattern/deviations: Step-through pattern;Decreased step length - right;Decreased step length - left;Shuffle;Trunk flexed Gait velocity: slow   General Gait Details: Pt with incr difficulty moving feet. Pt begins sitting. Pt orthostatic.   Stairs            Wheelchair Mobility    Modified Rankin (Stroke Patients Only)       Balance   Sitting-balance support: No upper extremity supported Sitting balance-Leahy Scale: Fair     Standing balance support: Bilateral upper extremity supported Standing balance-Leahy Scale: Poor Standing balance comment: Use of walker and min A.                    Cognition Arousal/Alertness: Awake/alert Behavior During Therapy: WFL for tasks assessed/performed Overall Cognitive Status: Within Functional Limits for tasks assessed                      Exercises      General Comments         Pertinent Vitals/Pain Pain Assessment: Faces Faces Pain Scale: Hurts a little bit Pain Location: sternum Pain Descriptors / Indicators: Sore Pain Intervention(s): Limited activity within patient's tolerance;Repositioned    Home Living                      Prior Function            PT Goals (current goals can now be found in the care plan section) Progress towards PT goals: Progressing toward goals    Frequency  Min 2X/week    PT Plan Current plan remains appropriate;Frequency needs to be updated    Co-evaluation             End of Session Equipment Utilized During Treatment: Gait belt Activity Tolerance: Patient limited by fatigue;Treatment limited secondary to medical complications (Comment) (orthostatic) Patient left: in chair;with call bell/phone within reach     Time: 1042-1109 PT Time Calculation (min): 27 min  Charges:  $Gait Training: 23-37 mins                    G Codes:      Travez Stancil 12/06/2013, 12:49 PM  [employer] PT [phone]

## 2013-11-27 NOTE — Clinical Social Work Note (Signed)
CSW noted PT recommendations for SNF at d/c- patient remains in 2S unit- will ask covering CSW to follow up to further proceed with plans.  Eduard Clos, MSW, [place]

## 2013-11-28 ENCOUNTER — Inpatient Hospital Stay (HOSPITAL_COMMUNITY): Payer: Medicare HMO

## 2013-11-28 DIAGNOSIS — J90 Pleural effusion, not elsewhere classified: Secondary | ICD-10-CM

## 2013-11-28 LAB — BASIC METABOLIC PANEL
Anion gap: 18 — ABNORMAL HIGH (ref 5–15)
BUN: 57 mg/dL — ABNORMAL HIGH (ref 6–23)
CO2: 30 mEq/L (ref 19–32)
Calcium: 8.8 mg/dL (ref 8.4–10.5)
Chloride: 79 mEq/L — ABNORMAL LOW (ref 96–112)
Creatinine, Ser: 2.51 mg/dL — ABNORMAL HIGH (ref 0.50–1.10)
GFR calc Af Amer: 20 mL/min — ABNORMAL LOW (ref >90)
GFR calc non Af Amer: 17 mL/min — ABNORMAL LOW (ref >90)
Glucose, Bld: 119 mg/dL — ABNORMAL HIGH (ref 70–99)
Potassium: 4 mEq/L (ref 3.7–5.3)
Sodium: 127 mEq/L — ABNORMAL LOW (ref 137–147)

## 2013-11-28 LAB — GLUCOSE, CAPILLARY
GLUCOSE-CAPILLARY: 129 mg/dL — AB (ref 70–99)
GLUCOSE-CAPILLARY: 173 mg/dL — AB (ref 70–99)
Glucose-Capillary: 151 mg/dL — ABNORMAL HIGH (ref 70–99)
Glucose-Capillary: 197 mg/dL — ABNORMAL HIGH (ref 70–99)
Glucose-Capillary: 182 mg/dL — ABNORMAL HIGH (ref 70–99)

## 2013-11-28 LAB — CBC
HCT: 33 % — ABNORMAL LOW (ref 36.0–46.0)
Hemoglobin: 11 g/dL — ABNORMAL LOW (ref 12.0–15.0)
MCH: 28.6 pg (ref 26.0–34.0)
MCHC: 33.3 g/dL (ref 30.0–36.0)
MCV: 85.7 fL (ref 78.0–100.0)
Platelets: 175 10*3/uL (ref 150–400)
RBC: 3.85 MIL/uL — ABNORMAL LOW (ref 3.87–5.11)
RDW: 15.9 % — ABNORMAL HIGH (ref 11.5–15.5)
WBC: 7.8 10*3/uL (ref 4.0–10.5)

## 2013-11-28 LAB — PROTIME-INR
INR: 3.5 — ABNORMAL HIGH (ref 0.00–1.49)
Prothrombin Time: 35.1 seconds — ABNORMAL HIGH (ref 11.6–15.2)

## 2013-11-28 MED ORDER — MIDAZOLAM HCL 2 MG/2ML IJ SOLN
INTRAMUSCULAR | Status: AC
  Administered 2013-11-28: 2 mg via INTRAVENOUS
  Filled 2013-11-28: qty 2

## 2013-11-28 MED ORDER — FUROSEMIDE 10 MG/ML IJ SOLN
160.0000 mg | Freq: Two times a day (BID) | INTRAMUSCULAR | Status: DC
  Administered 2013-11-28 – 2013-11-29 (×2): 160 mg via INTRAVENOUS
  Filled 2013-11-28 (×3): qty 16

## 2013-11-28 MED ORDER — MIDAZOLAM HCL 2 MG/2ML IJ SOLN
2.0000 mg | Freq: Once | INTRAMUSCULAR | Status: AC
  Administered 2013-11-28: 2 mg via INTRAVENOUS

## 2013-11-28 MED ORDER — FENTANYL CITRATE 0.05 MG/ML IJ SOLN
50.0000 ug | Freq: Once | INTRAMUSCULAR | Status: AC
  Administered 2013-11-28: 50 ug via INTRAVENOUS

## 2013-11-28 MED ORDER — WARFARIN SODIUM 1 MG PO TABS
1.0000 mg | ORAL_TABLET | Freq: Once | ORAL | Status: DC
  Filled 2013-11-28: qty 1

## 2013-11-28 MED ORDER — LIDOCAINE HCL (PF) 1 % IJ SOLN
100.0000 mg | Freq: Once | INTRAMUSCULAR | Status: AC
  Administered 2013-11-28: 100 mg via INTRADERMAL

## 2013-11-28 MED ORDER — LIDOCAINE HCL (PF) 1 % IJ SOLN
INTRAMUSCULAR | Status: AC
  Administered 2013-11-28: 100 mg via INTRADERMAL
  Filled 2013-11-28: qty 10

## 2013-11-28 MED ORDER — POTASSIUM CHLORIDE 10 MEQ/50ML IV SOLN: 10.0000 meq | Freq: Once | INTRAVENOUS | Status: DC

## 2013-11-28 MED ORDER — POTASSIUM CHLORIDE 10 MEQ/50ML IV SOLN: 10.0000 meq | Freq: Every morning | INTRAVENOUS | Status: DC

## 2013-11-28 MED ORDER — FENTANYL CITRATE 0.05 MG/ML IJ SOLN
INTRAMUSCULAR | Status: AC
  Administered 2013-11-28: 50 ug via INTRAVENOUS
  Filled 2013-11-28: qty 2

## 2013-11-28 NOTE — Progress Notes (Signed)
I saw the patient and agree with the above assessment and plan.    Patient continues to do well. She's not becoming as ambulatory as I wish we discussed that. Renal function is stable but did have a slight uptake in serum creatinine over the past 24 hours. We'll reduce Lasix dosing from 4 times daily to twice daily at the same dose. Otherwise no changes today. This could be her new renal baseline, time will tell.

## 2013-11-28 NOTE — Progress Notes (Signed)
Subjective:   Patient seen this AM, no complaints today. Feeling quite well, no stomach upset or nausea. LE edema improved.  3.3L urine output overnight.  Cr 2.51 this AM.  Na increased to 127 today.  In NSR. Off Amio gtt.   Objective Vital signs in last 24 hours: Filed Vitals:   11/28/13 0300 11/28/13 0400 11/28/13 0500 11/28/13 0600  BP: 121/48 126/54 111/46 136/52  Pulse: 74 74 74 75  Temp:  97.5 F (36.4 C)    TempSrc:  Oral    Resp: 17 14 16 14   Height:      Weight:      SpO2: 94% 96% 94% 97%   Weight change:   Intake/Output Summary (Last 24 hours) at 11/28/13 0658 Last data filed at 11/28/13 0600  Gross per 24 hour  Intake  685.8 ml  Output   3250 ml  Net -2564.2 ml   Weight Trending 11/28/13 0630 90.2 kg (198 lb 13.7 oz)  11/27/13 0645 91.4 kg (201 lb 8 oz) 11/26/13 0800 92.6 kg (204 lb 2.3 oz) 11/25/13 0600 95.6 kg (210 lb 12.2 oz) 11/24/13 0600 98.8 kg (217 lb 13 oz) 11/23/13 0500 101.3 kg (223 lb 5.2 oz) 11/22/13 0500 103 kg (227 lb 1.2 oz) 11/21/13 0500 104.5 kg (230 lb 6.1 oz) 11/20/13 0500 105.7 kg (233 lb 0.4 oz) 11/19/13 106.4 kg (234 lb 9.1 oz)  (? Validity) 11/18/13 0500 103 kg (227 lb 1.2 oz) 11/17/13 0500 104.9 kg (231 lb 4.2 oz) 11/16/13 0630 110.1 kg (242 lb 11.6 oz)   11/15/13 0500 110.2 kg (242 lb 15.2 oz)  11/14/13 0645 110 kg (242 lb 8.1 oz) 11/13/13 0500 107.8 kg (237 lb 10.5 oz)  11/12/13 0500 105.3 kg (232 lb 2.3 oz)  11/11/13 0530 101.5 kg (223 lb 12.3 oz)  11/10/13 0630 104 kg (229 lb 4.5 oz)  11/09/13 0200 98.4 kg (216 lb 14.9 oz) 11/08/13 1924 86.7 kg (191 lb 2.2 oz) 11/05/13 1330 86.7 kg (191 lb 2.2 oz)  Pre op weight  Physical Exam:  General: Obese female, alert, cooperative, NAD.  HEENT: PERRL, EOMI. Moist mucus membranes Neck: Full range of motion without pain, supple, no lymphadenopathy or carotid bruits Lungs: Clear to ascultation bilaterally, normal work of respiration, no wheezes, rales, rhonchi Heart: RRR, no  murmurs, gallops, or rubs Abdomen: Soft, non-tender, non-distended, BS + Extremities: +2 pitting edema extending to the thigh, much improved.  Neurologic: Alert & oriented X3, cranial nerves II-XII intact, strength grossly intact, sensation intact to light touch  Labs: Basic Metabolic Panel:  Recent Labs Lab 11/26/13 0500 11/27/13 0542 11/28/13 0500  NA 122* 124* 127*  K 3.8 3.9 4.0  CL 74* 75* 79*  CO2 33* 31 30  GLUCOSE 150* 157* 119*  BUN 64* 59* 57*  CREATININE 2.51* 2.40* 2.51*  CALCIUM 8.8 8.8 8.8     Recent Labs Lab 11/24/13 0250 11/25/13 0213 11/26/13 0500 11/27/13 0542 11/28/13 0500  WBC 10.9* 11.0* 9.2 8.8 7.8  HGB 9.8* 10.2* 10.1* 10.5* 11.0*  HCT 28.9* 29.3* 30.3* 32.1* 33.0*  MCV 84.8 83.7 85.6 88.7 85.7  PLT 177 167 174 177 175    Recent Labs Lab 11/27/13 1157 11/27/13 1552 11/27/13 1932 11/27/13 2337 11/28/13 0353  GLUCAP 159* 198* 148* 89 129*    Medications: Infusions: . sodium chloride 10 mL/hr at 11/28/13 0400  . sodium chloride 250 mL (11/21/13 1541)  . DOPamine 2.5 mcg/kg/min (11/28/13 0400)    Scheduled Medications: . amiodarone  200 mg Oral BID  . ceFEPime (MAXIPIME) IV  2 g Intravenous Q24H  . citalopram  20 mg Oral Daily  . darbepoetin (ARANESP) injection - NON-DIALYSIS  100 mcg Subcutaneous Q Wed-1800  . feeding supplement (GLUCERNA SHAKE)  237 mL Oral Q1500  . furosemide  160 mg Intravenous Q6H  . insulin aspart  0-24 Units Subcutaneous 6 times per day  . magic mouthwash  5 mL Oral TID  . metoprolol tartrate  12.5 mg Oral BID   Or  . metoprolol tartrate  12.5 mg Per Tube BID  . neomycin-bacitracin-polymyxin   Topical Daily  . nystatin cream   Topical TID  . pantoprazole  40 mg Oral Daily  . potassium chloride  40 mEq Oral Daily  . sodium chloride  10-40 mL Intracatheter Q12H  . Warfarin - Physician Dosing Inpatient   Does not apply q39    Background 80 year old female with multiple medical issues including stage  III CKD who is status post CABG and mitral valve replacement with postoperative acute on chronic renal failure (baseline creatinine around 1.5), marked volume overload (50 lb +)  Assessment/Recommendations  Renal- Acute on chronic kidney disease in the setting of above. Insult was most likely hemodynamic in nature/ATN in the setting of CABG. Cr 2.51 today, urine output 3.3L. On Lasix 160 mg IV q6h currently, approaching her preoperative weight. Will decrease Lasix to 160 IV bid given increase in Cr this AM.  Left Pleural Effusion- Previously had thoracentesis on 11/22/13. CXR from this AM shows persistent left pleural effusion. Management per CVTS, possible chest tube placement today. Hypertension/volume- Improving. Continue diuretic program as above. Still diuresing well.  Serratia Bacteremia- Tracheal aspirate and blood cultures 2/2 positive for serratia marcescens. Transitioned from Zosyn to Cefepime on 11/25/13. Day 8/14 of ABx.  Anemia- Situational and CKD- continue weekly aranesp. Iron studies from 9/17 w/ iron of 19, ferritin of 604, Tsat of 7%.  Hypokalemia-  Resolved, On 40 mEq K daily. Continue to monitor closely. Hyponatremia- Improving, now 127.  S/p CABG/MVR AFib on heparin, BB, amio po- In NSR, off Amiodarone gtt.     Signed: Luanne Bras, MD 11/28/2013 6:58 AM

## 2013-11-28 NOTE — Progress Notes (Signed)
CT Surgery PM Rounds  OOB to chair Chest tube with min drainage after initial 1 liter output of L effusion NSR good urine output

## 2013-11-28 NOTE — Op Note (Addendum)
Procedure- Left chest tube placement for recurrent postop pleural effusion  Surgeon- Prescott Gum, MD  Anesthesia- local 1% lidocaine and IV conscious sedation  Finding- 1200cc of serosanguinous fluid removed without complication after placement of a 20 F chest tube , connected to a Pleurovac drainage system.  PCXR is pending, no bleeding or other complication noted  P Prescott Gum, MD

## 2013-11-28 NOTE — Progress Notes (Addendum)
[address]       [place] [address]             [phone]      20 Days Post-Op Procedure(s) (LRB): CORONARY ARTERY BYPASS GRAFTING (CABG), on pump, times two, using left internal mammary artery, cryo saphenous vein. (N/A) INTRAOPERATIVE TRANSESOPHAGEAL ECHOCARDIOGRAM (N/A) MITRAL VALVE (MV) REPLACEMENT (N/A)  Subjective:  Ms. Mcavoy continues to feel better.  She states her stool remains loose, but she has been able to control this.  She worked with PT yesterday who continues to recommend SNF, however she did not walk otherwise.  Objective: Vital signs in last 24 hours: Temp:  [97.4 F (36.3 C)-98.1 F (36.7 C)] 97.4 F (36.3 C) (09/24 0700) Pulse Rate:  [72-104] 75 (09/24 0800) Cardiac Rhythm:  [-] Normal sinus rhythm (09/24 0800) Resp:  [11-30] 15 (09/24 0800) BP: (67-136)/(44-68) 114/46 mmHg (09/24 0800) SpO2:  [92 %-100 %] 97 % (09/24 0700) Weight:  [198 lb 13.7 oz (90.2 kg)] 198 lb 13.7 oz (90.2 kg) (09/24 0630)  Intake/Output from previous day: 09/23 0701 - 09/24 0700 In: 669.1 [I.V.:355.1; IV Piggyback:314] Out: 3300 [Urine:3300] Intake/Output this shift: Total I/O In: 80.1 [I.V.:14.1; IV Piggyback:66] Out: 165 [Urine:165]  General appearance: alert, cooperative and no distress Heart: regular rate and rhythm Lungs: clear to auscultation bilaterally Abdomen: soft, non-tender; bowel sounds normal; no masses,  no organomegaly Extremities: edema 2+, greatly improved Wound: clean and dry, sutures and staples remain in place  Lab Results:  Recent Labs  11/27/13 0542 11/28/13 0500  WBC 8.8 7.8  HGB 10.5* 11.0*  HCT 32.1* 33.0*  PLT 177 175   BMET:  Recent Labs  11/27/13 0542 11/28/13 0500  NA 124* 127*  K 3.9 4.0  CL 75* 79*  CO2 31 30  GLUCOSE 157* 119*  BUN 59* 57*  CREATININE 2.40* 2.51*  CALCIUM 8.8 8.8    PT/INR:  Recent Labs  11/28/13 0500  LABPROT 35.1*  INR 3.50*   ABG    Component Value Date/Time   PHART 7.318* 11/11/2013 1642   HCO3 18.5* 11/11/2013 1642   TCO2 20 11/11/2013 1642   ACIDBASEDEF 7.0* 11/11/2013 1642   O2SAT 60.2 11/21/2013 0420   CBG (last 3)   Recent Labs  11/27/13 2337 11/28/13 0353 11/28/13 0759  GLUCAP 89 129* 151*    Assessment/Plan: S/P Procedure(s) (LRB): CORONARY ARTERY BYPASS GRAFTING (CABG), on pump, times two, using left internal mammary artery, cryo saphenous vein. (N/A) INTRAOPERATIVE TRANSESOPHAGEAL ECHOCARDIOGRAM (N/A) MITRAL VALVE (MV) REPLACEMENT (N/A)  1.  CV- remains hemodynamically stable, NSR this morning- weaning Dopamine as creatinine improves- continue oral Amiodarone, Lopressor 2. Pulm- no acute issues, off oxygen, CXR with increasing left pleural effusion, Thoracentesis done several days ago, however may benefit from repeat procedure 3. Renal- creatinine stable today, good U/O weight trending down, continue diuresis per Neprhology 4. ID- + Serratia Bacteremia Day 8/14 on IV Cefipime 5. INR 3.50, patient is pretty sensitive to Coumadin, will decrease dose to 1 mg daily 6. DM- cbgs remain controlled 7. Deconditioning- patient works with PT, needs to ambulate TID with staff 8. Dispo- patient continues to progress slowly, increasing left pleural effusion may ultimately need repeat Thoracentesis in near future, decrease coumadin dose to 1 mg daily, wean Dopamine as creatinine allows   LOS: 23 days    Claudia [name], Claudia [name] 11/28/2013  L chest tube 15F placed to drain recurrent effusion Lasix dose decreased after bun , creat showed sl  increase today patient examined and medical record reviewed,agree with above note. VAN TRIGT III,Karoline Fleer 11/28/2013

## 2013-11-29 ENCOUNTER — Inpatient Hospital Stay (HOSPITAL_COMMUNITY): Payer: Medicare HMO

## 2013-11-29 LAB — CBC
HCT: 34.6 % — ABNORMAL LOW (ref 36.0–46.0)
Hemoglobin: 11.4 g/dL — ABNORMAL LOW (ref 12.0–15.0)
MCH: 28.4 pg (ref 26.0–34.0)
MCHC: 32.9 g/dL (ref 30.0–36.0)
MCV: 86.1 fL (ref 78.0–100.0)
Platelets: 203 10*3/uL (ref 150–400)
RBC: 4.02 MIL/uL (ref 3.87–5.11)
RDW: 15.8 % — ABNORMAL HIGH (ref 11.5–15.5)
WBC: 7.4 10*3/uL (ref 4.0–10.5)

## 2013-11-29 LAB — BASIC METABOLIC PANEL
Anion gap: 13 (ref 5–15)
BUN: 59 mg/dL — ABNORMAL HIGH (ref 6–23)
CO2: 34 mEq/L — ABNORMAL HIGH (ref 19–32)
Calcium: 8.6 mg/dL (ref 8.4–10.5)
Chloride: 81 mEq/L — ABNORMAL LOW (ref 96–112)
Creatinine, Ser: 2.63 mg/dL — ABNORMAL HIGH (ref 0.50–1.10)
GFR calc Af Amer: 19 mL/min — ABNORMAL LOW (ref >90)
GFR calc non Af Amer: 16 mL/min — ABNORMAL LOW (ref >90)
Glucose, Bld: 97 mg/dL (ref 70–99)
Potassium: 4.4 mEq/L (ref 3.7–5.3)
Sodium: 128 mEq/L — ABNORMAL LOW (ref 137–147)

## 2013-11-29 LAB — GLUCOSE, CAPILLARY
Glucose-Capillary: 182 mg/dL — ABNORMAL HIGH (ref 70–99)
Glucose-Capillary: 103 mg/dL — ABNORMAL HIGH (ref 70–99)
Glucose-Capillary: 144 mg/dL — ABNORMAL HIGH (ref 70–99)
Glucose-Capillary: 208 mg/dL — ABNORMAL HIGH (ref 70–99)
Glucose-Capillary: 227 mg/dL — ABNORMAL HIGH (ref 70–99)
Glucose-Capillary: 225 mg/dL — ABNORMAL HIGH (ref 70–99)

## 2013-11-29 LAB — PROTIME-INR
INR: 3.29 — ABNORMAL HIGH (ref 0.00–1.49)
Prothrombin Time: 33.5 seconds — ABNORMAL HIGH (ref 11.6–15.2)

## 2013-11-29 MED ORDER — MAGNESIUM SULFATE IN D5W 10-5 MG/ML-% IV SOLN
1.0000 g | Freq: Once | INTRAVENOUS | Status: AC
  Administered 2013-11-29: 1 g via INTRAVENOUS
  Filled 2013-11-29: qty 100

## 2013-11-29 MED ORDER — FUROSEMIDE 80 MG PO TABS
160.0000 mg | ORAL_TABLET | Freq: Two times a day (BID) | ORAL | Status: DC
  Administered 2013-11-29 – 2013-12-02 (×6): 160 mg via ORAL
  Filled 2013-11-29 (×10): qty 2

## 2013-11-29 MED ORDER — WARFARIN SODIUM 1 MG PO TABS
1.0000 mg | ORAL_TABLET | Freq: Every day | ORAL | Status: DC
  Administered 2013-11-30: 1 mg via ORAL
  Filled 2013-11-29 (×2): qty 1

## 2013-11-29 MED ORDER — PATIENT'S GUIDE TO USING COUMADIN BOOK
Freq: Once | Status: AC
  Administered 2013-11-29: 12:00:00
  Filled 2013-11-29: qty 1

## 2013-11-29 MED ORDER — SODIUM CHLORIDE 0.9 % IV SOLN: 250.0000 mL | INTRAVENOUS | Status: DC | PRN

## 2013-11-29 MED ORDER — MOVING RIGHT ALONG BOOK
Freq: Once | Status: AC
Start: 2013-11-29 — End: 2013-11-29
  Administered 2013-11-29: 15:00:00
  Filled 2013-11-29: qty 1

## 2013-11-29 MED ORDER — INSULIN ASPART 100 UNIT/ML ~~LOC~~ SOLN
0.0000 [IU] | Freq: Three times a day (TID) | SUBCUTANEOUS | Status: DC
  Administered 2013-11-29 (×2): 8 [IU] via SUBCUTANEOUS
  Administered 2013-11-30 (×2): 2 [IU] via SUBCUTANEOUS
  Administered 2013-11-30 (×2): 8 [IU] via SUBCUTANEOUS
  Administered 2013-12-01: 4 [IU] via SUBCUTANEOUS
  Administered 2013-12-01: 2 [IU] via SUBCUTANEOUS
  Administered 2013-12-02: 8 [IU] via SUBCUTANEOUS
  Administered 2013-12-02: 4 [IU] via SUBCUTANEOUS

## 2013-11-29 MED ORDER — SODIUM CHLORIDE 0.9 % IJ SOLN
3.0000 mL | Freq: Two times a day (BID) | INTRAMUSCULAR | Status: DC
  Administered 2013-12-02 – 2013-12-05 (×3): 3 mL via INTRAVENOUS

## 2013-11-29 MED ORDER — SODIUM CHLORIDE 0.9 % IJ SOLN: 3.0000 mL | INTRAMUSCULAR | Status: DC | PRN

## 2013-11-29 MED ORDER — WARFARIN VIDEO
Freq: Once | Status: AC
  Administered 2013-11-30: 11:00:00

## 2013-11-29 NOTE — Progress Notes (Signed)
I saw the patient and agree with the above assessment and plan.    Patient is doing well. She status post thoracocentesis and chest tube placement yesterday with over 1 L of pleural fluid drained. Her weight is nearing her preoperative weight. She still diuresed well on reduced frequency Lasix. GFR is relatively stable. We will switch to oral Lasix twice daily at 160 mg. We will continue to follow.

## 2013-11-29 NOTE — Progress Notes (Signed)
11/29/13 1600  Clinical Encounter Type  Visited With Patient and family together  Visit Type Initial  Referral From Nurse  Spiritual Encounters  Spiritual Needs Emotional   Chaplain visited with patient briefly. Chaplain was referred to patient via [place] consult. Patient was being visited by family members. Patient said she was feeling much better but is ready to go home. Patient described being at the hospital for a long time. Patient is religious and wants to be able to go to church. Patient was ill for a while before she came to the hospital and has not been able to attend church for a long time. Patient's family seem supportive and lightened her mood. Chaplain recommends another Chaplain follow-up when time is available, patient has not been able to see many visitors during her long stay at the hospital and would appreciate company.Gar Ponto, Chaplain 4:52 PM

## 2013-11-29 NOTE — Progress Notes (Signed)
ANTIBIOTIC CONSULT NOTE   Pharmacy Consult for Cefepime Indication: Serratia sepsis with MVR  Allergies  Allergen Reactions  . Aspirin Other (See Comments)    High doses caused stomach bleeds    Patient Measurements: Height: 5\' 7"  (170.2 cm) Weight: 192 lb 12.8 oz (87.454 kg) IBW/kg (Calculated) : 61.6  Labs:  Recent Labs  11/27/13 0542 11/28/13 0500 11/29/13 0415  WBC 8.8 7.8 7.4  HGB 10.5* 11.0* 11.4*  PLT 177 175 203  CREATININE 2.40* 2.51* 2.63*    Microbiology: Recent Results (from the past 720 hour(s))  MRSA PCR SCREENING     Status: None   Collection Time    11/05/13  1:28 PM      Result Value Ref Range Status   MRSA by PCR NEGATIVE  NEGATIVE Final   Comment:            The GeneXpert MRSA Assay (FDA     approved for NASAL specimens     only), is one component of a     comprehensive MRSA colonization     surveillance program. It is not     intended to diagnose MRSA     infection nor to guide or     monitor treatment for     MRSA infections.  SURGICAL PCR SCREEN     Status: None   Collection Time    11/06/13  8:13 AM      Result Value Ref Range Status   MRSA, PCR NEGATIVE  NEGATIVE Final   Staphylococcus aureus NEGATIVE  NEGATIVE Final   Comment:            The Xpert SA Assay (FDA     approved for NASAL specimens     in patients over 79 years of age),     is one component of     a comprehensive surveillance     program.  Test performance has     been validated by [employer] for patients greater     than or equal to 29 year old.     It is not intended     to diagnose infection nor to     guide or monitor treatment.  CULTURE, BLOOD (SINGLE)     Status: None   Collection Time    11/16/13  9:15 AM      Result Value Ref Range Status   Specimen Description BLOOD LEFT ARM   Final   Special Requests BOTTLES DRAWN AEROBIC ONLY 4CC   Final   Culture  Setup Time     Final   Value: 11/16/2013 15:20     Performed at [facility]   Culture      Final   Value: NO GROWTH 5 DAYS     Performed at [facility]   Report Status 11/22/2013 FINAL   Final  URINE CULTURE     Status: None   Collection Time    11/16/13  9:36 AM      Result Value Ref Range Status   Specimen Description URINE, CATHETERIZED   Final   Special Requests Normal   Final   Culture  Setup Time     Final   Value: 11/16/2013 19:34     Performed at [facility]     Final   Value: NO GROWTH     Performed at [facility]   Culture     Final   Value: NO  GROWTH     Performed at [facility]   Report Status 11/17/2013 FINAL   Final  CULTURE, RESPIRATORY (NON-EXPECTORATED)     Status: None   Collection Time    11/21/13  9:00 AM      Result Value Ref Range Status   Specimen Description TRACHEAL ASPIRATE   Final   Special Requests Normal   Final   Gram Stain     Final   Value: MODERATE WBC PRESENT,BOTH PMN AND MONONUCLEAR     RARE SQUAMOUS EPITHELIAL CELLS PRESENT     NO ORGANISMS SEEN     Performed at [facility]   Culture     Final   Value: FEW SERRATIA MARCESCENS     Performed at [facility]   Report Status 11/26/2013 FINAL   Final   Organism ID, Bacteria SERRATIA MARCESCENS   Final  CULTURE, BLOOD (ROUTINE X 2)     Status: None   Collection Time    11/21/13  9:35 AM      Result Value Ref Range Status   Specimen Description BLOOD RIGHT ARM   Final   Special Requests BOTTLES DRAWN AEROBIC ONLY 5CC   Final   Culture  Setup Time     Final   Value: 11/21/2013 14:44     Performed at [facility]   Culture     Final   Value: SERRATIA MARCESCENS     Note: Gram Stain Report Called to,Read Back By and Verified With: JESSIE SUTTER 11/23/13 AT 0445 [place]     Performed at [facility]   Report Status 11/26/2013 FINAL   Final   Organism ID, Bacteria SERRATIA MARCESCENS   Final  CULTURE, BLOOD (ROUTINE X 2)     Status: None   Collection Time    11/21/13  9:40 AM      Result Value  Ref Range Status   Specimen Description BLOOD RIGHT FOREARM   Final   Special Requests BOTTLES DRAWN AEROBIC ONLY 5CC   Final   Culture  Setup Time     Final   Value: 11/21/2013 14:43     Performed at [facility]   Culture     Final   Value: SERRATIA MARCESCENS     Note: SUSCEPTIBILITIES PERFORMED ON PREVIOUS CULTURE WITHIN THE LAST 5 DAYS.     Note: Gram Stain Report Called to,Read Back By and Verified With: JESSIE SUTTER 11/23/13 AT 65 RIDK     Performed at [facility]   Report Status 11/25/2013 FINAL   Final  URINE CULTURE     Status: None   Collection Time    11/21/13 10:30 AM      Result Value Ref Range Status   Specimen Description URINE, CATHETERIZED   Final   Special Requests has completed 10 days of Ancef Normal   Final   Culture  Setup Time     Final   Value: 11/21/2013 17:18     Performed at [facility]     Final   Value: 9,000 COLONIES/ML     Performed at [facility]   Culture     Final   Value: INSIGNIFICANT GROWTH     Performed at [facility]   Report Status 11/23/2013 FINAL   Final  CLOSTRIDIUM DIFFICILE BY PCR     Status: None   Collection Time    11/21/13  9:00 PM  Result Value Ref Range Status   C difficile by pcr NEGATIVE  NEGATIVE Final  BODY FLUID CULTURE     Status: None   Collection Time    11/22/13 11:02 AM      Result Value Ref Range Status   Specimen Description PLEURAL FLUID RIGHT   Final   Special Requests 60 ML FLUID   Final   Gram Stain     Final   Value: NO WBC SEEN     NO ORGANISMS SEEN     Performed at [facility]   Culture     Final   Value: NO GROWTH 3 DAYS     Performed at [facility]   Report Status 11/26/2013 FINAL   Final    Medical History: Past Medical History  Diagnosis Date  . GERD (gastroesophageal reflux disease)   . PUD (peptic ulcer disease)   . Anemia   . Hypertension   . S/P endoscopy Aug 2011    3 superficial gastric ulcers,  NSAID-induced  . S/P colonoscopy Sept 2011    left-sided diverticula, tubular adenoma  . Coronary artery disease   . Shortness of breath   . Diabetes mellitus     insulin dependent  . Headache(784.0)     rare migraines  . Cancer     hx of skin cancer  . Arthritis   . Osteopenia   . Hypercholesterolemia   . SIADH (syndrome of inappropriate ADH production)   . CHF (congestive heart failure)   . Myocardial infarction 2013    Assessment: 80 year old female Pod 17 CABG with MVR Blood cultures and sputum culture positive for Serratia CrCl = 20 ml /min  Planning 14 days of treatment - Day 9 of 14   Goal of Therapy:  Appropriate dosing  Plan:  Cefepime 2gram iv Q 24 hours Follow up Scr, progress  Thank you. Anette Guarneri, PharmD [phone]  11/29/2013,11:32 AM

## 2013-11-29 NOTE — Progress Notes (Signed)
Patient got up to walk and ambulated 4 feet and sat down on the bed refusing to walk. Patient placed back to bed with call bell within reach. [place]ade Nurse, RN

## 2013-11-29 NOTE — Progress Notes (Signed)
Subjective:   Patient seen this AM, no significant complaints. Looks good this morning. Mildly increased Cr to 2.53 this AM. Still w/ ~4L urine output.  Weight 193 lbs, very close to preoperative weight.  S/p left chest tube placement yesterday for persistent pleural effusion. Draining serosanguinous fluid.   Objective Vital signs in last 24 hours: Filed Vitals:   11/29/13 0300 11/29/13 0400 11/29/13 0500 11/29/13 0600  BP: 113/47 126/59 116/45 119/47  Pulse: 66 68 68 67  Temp:  97.5 F (36.4 C)    TempSrc:  Oral    Resp: 13 14 13 13   Height:      Weight:    192 lb 12.8 oz (87.454 kg)  SpO2: 97% 97% 98% 95%   Weight change: -6 lb 0.9 oz (-2.747 kg)  Intake/Output Summary (Last 24 hours) at 11/29/13 0705 Last data filed at 11/29/13 0600  Gross per 24 hour  Intake  506.3 ml  Output   3940 ml  Net -3433.7 ml   Weight Trending 11/29/13 0600 87.454 kg (192 lb 12.8 oz)  11/28/13 0630 90.2 kg (198 lb 13.7 oz)  11/27/13 0645 91.4 kg (201 lb 8 oz) 11/26/13 0800 92.6 kg (204 lb 2.3 oz) 11/25/13 0600 95.6 kg (210 lb 12.2 oz) 11/24/13 0600 98.8 kg (217 lb 13 oz) 11/23/13 0500 101.3 kg (223 lb 5.2 oz) 11/22/13 0500 103 kg (227 lb 1.2 oz) 11/21/13 0500 104.5 kg (230 lb 6.1 oz) 11/20/13 0500 105.7 kg (233 lb 0.4 oz) 11/19/13 106.4 kg (234 lb 9.1 oz)  (? Validity) 11/18/13 0500 103 kg (227 lb 1.2 oz) 11/17/13 0500 104.9 kg (231 lb 4.2 oz) 11/16/13 0630 110.1 kg (242 lb 11.6 oz)   11/15/13 0500 110.2 kg (242 lb 15.2 oz)  11/14/13 0645 110 kg (242 lb 8.1 oz) 11/13/13 0500 107.8 kg (237 lb 10.5 oz)  11/12/13 0500 105.3 kg (232 lb 2.3 oz)  11/11/13 0530 101.5 kg (223 lb 12.3 oz)  11/10/13 0630 104 kg (229 lb 4.5 oz)  11/09/13 0200 98.4 kg (216 lb 14.9 oz) 11/08/13 1924 86.7 kg (191 lb 2.2 oz) 11/05/13 1330 86.7 kg (191 lb 2.2 oz)  Pre op weight  Physical Exam:  General: Obese female, alert, cooperative, NAD.  HEENT: PERRL, EOMI. Moist mucus membranes Neck: Full range of motion  without pain, supple, no lymphadenopathy or carotid bruits Lungs: Clear to ascultation bilaterally, normal work of respiration, no wheezes, rales, rhonchi. Left-sided chest tube. Heart: RRR, no murmurs, gallops, or rubs Abdomen: Soft, non-tender, non-distended, BS + Extremities: +1 pitting edema extending to the thigh, much improved.  Neurologic: Alert & oriented X3, cranial nerves II-XII intact, strength grossly intact, sensation intact to light touch  Labs: Basic Metabolic Panel:  Recent Labs Lab 11/27/13 0542 11/28/13 0500 11/29/13 0415  NA 124* 127* 128*  K 3.9 4.0 4.4  CL 75* 79* 81*  CO2 31 30 34*  GLUCOSE 157* 119* 97  BUN 59* 57* 59*  CREATININE 2.40* 2.51* 2.63*  CALCIUM 8.8 8.8 8.6     Recent Labs Lab 11/25/13 0213 11/26/13 0500 11/27/13 0542 11/28/13 0500 11/29/13 0415  WBC 11.0* 9.2 8.8 7.8 7.4  HGB 10.2* 10.1* 10.5* 11.0* 11.4*  HCT 29.3* 30.3* 32.1* 33.0* 34.6*  MCV 83.7 85.6 88.7 85.7 86.1  PLT 167 174 177 175 203    Recent Labs Lab 11/28/13 1234 11/28/13 1631 11/28/13 1947 11/28/13 2314 11/29/13 0403  GLUCAP 173* 197* 182* 182* 103*    Medications: Infusions: . sodium chloride  10 mL/hr at 11/29/13 0600  . sodium chloride 250 mL (11/21/13 1541)  . DOPamine 2.5 mcg/kg/min (11/29/13 0600)    Scheduled Medications: . amiodarone  200 mg Oral BID  . ceFEPime (MAXIPIME) IV  2 g Intravenous Q24H  . citalopram  20 mg Oral Daily  . darbepoetin (ARANESP) injection - NON-DIALYSIS  100 mcg Subcutaneous Q Wed-1800  . feeding supplement (GLUCERNA SHAKE)  237 mL Oral Q1500  . furosemide  160 mg Intravenous BID  . insulin aspart  0-24 Units Subcutaneous 6 times per day  . magic mouthwash  5 mL Oral TID  . metoprolol tartrate  12.5 mg Oral BID   Or  . metoprolol tartrate  12.5 mg Per Tube BID  . neomycin-bacitracin-polymyxin   Topical Daily  . nystatin cream   Topical TID  . pantoprazole  40 mg Oral Daily  . potassium chloride  10 mEq Intravenous  q morning - 10a  . sodium chloride  10-40 mL Intracatheter Q12H  . Warfarin - Physician Dosing Inpatient   Does not apply q15    Background 80 year old female with multiple medical issues including stage III CKD who is status post CABG and mitral valve replacement with postoperative acute on chronic renal failure (baseline creatinine around 1.5), marked volume overload (50 lb +)  Assessment/Recommendations  Renal- Acute on chronic kidney disease in the setting of above. Insult was most likely hemodynamic in nature/ATN in the setting of CABG. Cr. 2.61 this AM, increased from yesterday, however, this is likely her new baseline. ~4L urine output over past 24 hours. Weight close to preoperative weight. Decreased Lasix to 160 mg IV bid yesterday. Will change this to 160 mg po bid today.  Left Pleural Effusion- Previously had thoracentesis on 11/22/13. CXR from this AM shows persistent left pleural effusion. Management per CVTS, chest tube placed yesterday, draining serosanguinous fluid.  Hypertension/volume- Improving. Continue diuretic program as above. Still diuresing well.  Serratia Bacteremia- Tracheal aspirate and blood cultures 2/2 positive for serratia marcescens. Transitioned from Zosyn to Cefepime on 11/25/13. Day 9/14 of ABx.  Anemia- Situational and CKD- continue weekly aranesp. Iron studies from 9/17 w/ iron of 19, ferritin of 604, Tsat of 7%.  Hypokalemia-  Resolved. 4.4 this AM. Stop Daily supplementation.  Hyponatremia- Improving, now 128.  S/p CABG/MVR AFib on heparin, BB, amio po- In NSR, off Amiodarone gtt.     Signed: Luanne Bras, MD 11/29/2013 7:05 AM

## 2013-11-29 NOTE — Progress Notes (Signed)
21 Days Post-Op Procedure(s) (LRB): CORONARY ARTERY BYPASS GRAFTING (CABG), on pump, times two, using left internal mammary artery, cryo saphenous vein. (N/A) INTRAOPERATIVE TRANSESOPHAGEAL ECHOCARDIOGRAM (N/A) MITRAL VALVE (MV) REPLACEMENT (N/A) Subjective: Sepsis with gram neg pneumonia , pos blood cultures-- Serratia   Maxepime  For 14 days L chest tube for postop effusion- min drainage overnight, will remove afib , MVR on coumadin low dose- nsr on amio 200 bid Postop ATN followed by renal- cont 2.5 dopamine until creat 2.0 DM - controlled Objective: Vital signs in last 24 hours: Temp:  [96.8 F (36 C)-98 F (36.7 C)] 98 F (36.7 C) (09/25 0744) Pulse Rate:  [66-79] 70 (09/25 0744) Cardiac Rhythm:  [-] Normal sinus rhythm (09/25 0600) Resp:  [10-18] 10 (09/25 0744) BP: (97-133)/(39-59) 108/46 mmHg (09/25 0744) SpO2:  [95 %-100 %] 99 % (09/25 0744) Weight:  [192 lb 12.8 oz (87.454 kg)] 192 lb 12.8 oz (87.454 kg) (09/25 0600)  Hemodynamic parameters for last 24 hours:  afeb  Intake/Output from previous day: 09/24 0701 - 09/25 0700 In: 520.4 [I.V.:338.4; IV Piggyback:182] Out: 4650 [Urine:2740; Chest Tube:1270] Intake/Output this shift:    OOB to chair Neuro intact Incisions clean  Lab Results:  Recent Labs  11/28/13 0500 11/29/13 0415  WBC 7.8 7.4  HGB 11.0* 11.4*  HCT 33.0* 34.6*  PLT 175 203   BMET:  Recent Labs  11/28/13 0500 11/29/13 0415  NA 127* 128*  K 4.0 4.4  CL 79* 81*  CO2 30 34*  GLUCOSE 119* 97  BUN 57* 59*  CREATININE 2.51* 2.63*  CALCIUM 8.8 8.6    PT/INR:  Recent Labs  11/29/13 0415  LABPROT 33.5*  INR 3.29*   ABG    Component Value Date/Time   PHART 7.318* 11/11/2013 1642   HCO3 18.5* 11/11/2013 1642   TCO2 20 11/11/2013 1642   ACIDBASEDEF 7.0* 11/11/2013 1642   O2SAT 60.2 11/21/2013 0420   CBG (last 3)   Recent Labs  11/28/13 1947 11/28/13 2314 11/29/13 0403  GLUCAP 182* 182* 103*    Assessment/Plan: S/P Procedure(s)  (LRB): CORONARY ARTERY BYPASS GRAFTING (CABG), on pump, times two, using left internal mammary artery, cryo saphenous vein. (N/A) INTRAOPERATIVE TRANSESOPHAGEAL ECHOCARDIOGRAM (N/A) MITRAL VALVE (MV) REPLACEMENT (N/A) tx to stepdown Complete 14 days iv maxepime SNF is long term plan   LOS: 24 days    Claudia [name],Claudia [name] 11/29/2013

## 2013-11-30 ENCOUNTER — Inpatient Hospital Stay (HOSPITAL_COMMUNITY): Payer: Medicare HMO

## 2013-11-30 LAB — BASIC METABOLIC PANEL
Anion gap: 14 (ref 5–15)
BUN: 55 mg/dL — ABNORMAL HIGH (ref 6–23)
CO2: 33 mEq/L — ABNORMAL HIGH (ref 19–32)
Calcium: 8.8 mg/dL (ref 8.4–10.5)
Chloride: 78 mEq/L — ABNORMAL LOW (ref 96–112)
Creatinine, Ser: 2.73 mg/dL — ABNORMAL HIGH (ref 0.50–1.10)
GFR calc Af Amer: 18 mL/min — ABNORMAL LOW (ref >90)
GFR calc non Af Amer: 15 mL/min — ABNORMAL LOW (ref >90)
Glucose, Bld: 120 mg/dL — ABNORMAL HIGH (ref 70–99)
Potassium: 4 mEq/L (ref 3.7–5.3)
Sodium: 125 mEq/L — ABNORMAL LOW (ref 137–147)

## 2013-11-30 LAB — CBC
HCT: 33.8 % — ABNORMAL LOW (ref 36.0–46.0)
Hemoglobin: 11 g/dL — ABNORMAL LOW (ref 12.0–15.0)
MCH: 28.8 pg (ref 26.0–34.0)
MCHC: 32.5 g/dL (ref 30.0–36.0)
MCV: 88.5 fL (ref 78.0–100.0)
Platelets: 206 10*3/uL (ref 150–400)
RBC: 3.82 MIL/uL — ABNORMAL LOW (ref 3.87–5.11)
RDW: 15.7 % — ABNORMAL HIGH (ref 11.5–15.5)
WBC: 7.6 10*3/uL (ref 4.0–10.5)

## 2013-11-30 LAB — PROTIME-INR
INR: 2.23 — ABNORMAL HIGH (ref 0.00–1.49)
Prothrombin Time: 24.7 seconds — ABNORMAL HIGH (ref 11.6–15.2)

## 2013-11-30 LAB — GLUCOSE, CAPILLARY
GLUCOSE-CAPILLARY: 202 mg/dL — AB (ref 70–99)
Glucose-Capillary: 149 mg/dL — ABNORMAL HIGH (ref 70–99)
Glucose-Capillary: 223 mg/dL — ABNORMAL HIGH (ref 70–99)
Glucose-Capillary: 132 mg/dL — ABNORMAL HIGH (ref 70–99)

## 2013-11-30 MED ORDER — INSULIN GLARGINE 100 UNIT/ML ~~LOC~~ SOLN
8.0000 [IU] | Freq: Two times a day (BID) | SUBCUTANEOUS | Status: DC
  Administered 2013-11-30 (×2): 8 [IU] via SUBCUTANEOUS
  Filled 2013-11-30 (×4): qty 0.08

## 2013-11-30 NOTE — Progress Notes (Signed)
Pt refused to ambulate tonight.  Pt states she is tired and hopes to rest tonight.  Education reinforced.  Will continue to encourage pt.  Pt resting in bed with call bell in reach.  Will continue to monitor.

## 2013-11-30 NOTE — Progress Notes (Signed)
Pt refused to ambulate tonight, stated she wanted to wait til morning.  Pt education reinforced and encouraged.  Will continue to monitor.  Call bell within reach.

## 2013-11-30 NOTE — Progress Notes (Addendum)
[address]       [place] [address]             [phone]        22 Days Post-Op Procedure(s) (LRB): CORONARY ARTERY BYPASS GRAFTING (CABG), on pump, times two, using left internal mammary artery, cryo saphenous vein. (N/A) INTRAOPERATIVE TRANSESOPHAGEAL ECHOCARDIOGRAM (N/A) MITRAL VALVE (MV) REPLACEMENT (N/A)  Subjective: Patient in good spirits. She has not had a bowel movement in 2 days but does not want a laxative at this time.  Objective: Vital signs in last 24 hours: Temp:  [97.3 F (36.3 C)-97.7 F (36.5 C)] 97.5 F (36.4 C) (09/26 0443) Pulse Rate:  [70-74] 73 (09/26 0642) Cardiac Rhythm:  [-] Normal sinus rhythm (09/26 0739) Resp:  [11-18] 17 (09/26 0443) BP: (91-122)/(43-55) 112/55 mmHg (09/26 0642) SpO2:  [97 %-99 %] 98 % (09/26 0443) Weight:  [195 lb 12.3 oz (88.8 kg)] 195 lb 12.3 oz (88.8 kg) (09/26 0500)  Pre op weight 86.7 kg Current Weight  11/30/13 195 lb 12.3 oz (88.8 kg)      Intake/Output from previous day: 09/25 0701 - 09/26 0700 In: 550.5 [P.O.:480; I.V.:70.5] Out: 1105 [Urine:1085; Chest Tube:20]   Physical Exam:  Cardiovascular: RRR. Pulmonary: Mostly clear to auscultation bilaterally; no rales, wheezes, or rhonchi. Abdomen: Soft, non tender, bowel sounds present. Extremities: Bilateral lower extremity edema. Wounds: Clean and dry.  No erythema or signs of infection. Small superficial sloughy ulcer like wound developing at left  inguinal fold. No drainage.  Lab Results: CBC: Recent Labs  11/29/13 0415 11/30/13 0430  WBC 7.4 7.6  HGB 11.4* 11.0*  HCT 34.6* 33.8*  PLT 203 206   BMET:  Recent Labs  11/29/13 0415 11/30/13 0430  NA 128* 125*  K 4.4 4.0  CL 81* 78*  CO2 34* 33*  GLUCOSE 97 120*  BUN 59* 55*  CREATININE 2.63* 2.73*  CALCIUM 8.6 8.8    PT/INR:  Lab Results  Component Value Date   INR 2.23* 11/30/2013   INR 3.29* 11/29/2013   INR 3.50* 11/28/2013   ABG:  INR: Will add last  result for INR, ABG once components are confirmed Will add last 4 CBG results once components are confirmed  Assessment/Plan:  1. CV - SR in the 70's this am. On Amiodarone 200 bid, Lopressor 12.5 bid, and Coumadin. Also, on low dose Dopamine drip. INR decreased from 3.29 to 2.23. Continue with low dose Coumadin (1 mg) 2.  Pulmonary - Had leftt chest tube placed for recurrent pleural effusion on 9/24. Removed yesterday. CXR appears to show no pneumothorax, bibasilar atelectasis , mild cardiomegaly, and small bilateral pleural effusions.Encourage incentive spirometer 3. ID- On Maxipime (day 10/14) for Serratia Marcescens bacteremia. Per pharmacy 4.  Acute blood loss anemia - H and H stable at 11 and 33.8. On Aranesp 5. Hyponatremia-sodium decreased to 125 6. ATN-creatinine up to 2.73. Per nephrology. Bladder scanned early this am. In and out cath'd with about 550 cc removed. Monitor. 7.DM- CBGs 227/225/149. On Insulin PRN. Will schedule daily  for better glucose control. 8. On Lasix 160 bid-per nephrology 9.Deconditioned-continue with PT and daily ambulation 10. Wound care consult for small wound developing proximal upper left groin meets abdomen. Apply dry 4x4 for now  ZIMMERMAN,DONIELLE MPA-C 11/30/2013,8:25 AM   Chart reviewed, patient examined, agree with above. Her creat is up slightly and need to avoid drying her out too quickly. Her albumin has been low and she is chronically  ill so she will remain edematous. Her weight is only about 4 lbs over preop wt.

## 2013-11-30 NOTE — Progress Notes (Signed)
Pt due to void, pt attempt multiple times to urinate on own.  Bladder scan reveals 826cc.  In and out performed per protocol using sterile technique.  550cc clear, yellow urine out.  Post cath scan reveals 56cc left.  Pt states relief.  Will continue to monitor closely.  Pt resting with call bell in reach.

## 2013-11-30 NOTE — Progress Notes (Signed)
Subjective:   Moved to 2W Now on PO lasix SCr stable, slight uptick UOP ok Not ambulating very much; seems volitional Remains on dopamin gtt   Objective Vital signs in last 24 hours: Filed Vitals:   11/29/13 1925 11/30/13 0443 11/30/13 0500 11/30/13 0642  BP: 117/43 91/54  112/55  Pulse: 73 74  73  Temp: 97.7 F (36.5 C) 97.5 F (36.4 C)    TempSrc: Oral Oral    Resp: 15 17    Height:      Weight:   88.8 kg (195 lb 12.3 oz)   SpO2: 99% 98%     Weight change: 1.347 kg (2 lb 15.5 oz)  Intake/Output Summary (Last 24 hours) at 11/30/13 0843 Last data filed at 11/29/13 2047  Gross per 24 hour  Intake  536.4 ml  Output   1045 ml  Net -508.6 ml   Physical Exam:  NAD RRR CTAb ant auscultation tarce to 1+ LEE No rashes/lesions  Labs: Basic Metabolic Panel:  Recent Labs Lab 11/28/13 0500 11/29/13 0415 11/30/13 0430  NA 127* 128* 125*  K 4.0 4.4 4.0  CL 79* 81* 78*  CO2 30 34* 33*  GLUCOSE 119* 97 120*  BUN 57* 59* 55*  CREATININE 2.51* 2.63* 2.73*  CALCIUM 8.8 8.6 8.8     Recent Labs Lab 11/26/13 0500 11/27/13 0542 11/28/13 0500 11/29/13 0415 11/30/13 0430  WBC 9.2 8.8 7.8 7.4 7.6  HGB 10.1* 10.5* 11.0* 11.4* 11.0*  HCT 30.3* 32.1* 33.0* 34.6* 33.8*  MCV 85.6 88.7 85.7 86.1 88.5  PLT 174 177 175 203 206    Recent Labs Lab 11/29/13 0740 11/29/13 1143 11/29/13 1604 11/29/13 2136 11/30/13 0640  GLUCAP 144* 208* 227* 225* 149*    Medications: Infusions: . sodium chloride 10 mL/hr at 11/29/13 0700  . sodium chloride 250 mL (11/21/13 1541)  . DOPamine 2.5 mcg/kg/min (11/30/13 0550)    Scheduled Medications: . amiodarone  200 mg Oral BID  . ceFEPime (MAXIPIME) IV  2 g Intravenous Q24H  . citalopram  20 mg Oral Daily  . darbepoetin (ARANESP) injection - NON-DIALYSIS  100 mcg Subcutaneous Q Wed-1800  . feeding supplement (GLUCERNA SHAKE)  237 mL Oral Q1500  . furosemide  160 mg Oral BID  . insulin aspart  0-24 Units Subcutaneous TID AC &  HS  . magic mouthwash  5 mL Oral TID  . metoprolol tartrate  12.5 mg Oral BID   Or  . metoprolol tartrate  12.5 mg Per Tube BID  . neomycin-bacitracin-polymyxin   Topical Daily  . nystatin cream   Topical TID  . pantoprazole  40 mg Oral Daily  . sodium chloride  10-40 mL Intracatheter Q12H  . sodium chloride  3 mL Intravenous Q12H  . warfarin  1 mg Oral q1800  . warfarin   Does not apply Once  . Warfarin - Physician Dosing Inpatient   Does not apply q27    Background 81 year old female with multiple medical issues including stage III CKD who is status post CABG and mitral valve replacement with postoperative acute on chronic renal failure (baseline creatinine around 1.5), marked volume overload (50 lb +)  Assessment/Recommendations  Renal- GFR largely stable.  Now on PO diuretics, weight up a little but different scal.  No chagne to diuretics today.   Left Pleural Effusion- Recent chest tube (9/24). CT surgery following Hypertension/volume- Improving. Continue diuretic program as above. Follow daily weights, UOP  Serratia Bacteremia- Tracheal aspirate and blood cultures 2/2 positive  for serratia marcescens. Transitioned from Zosyn to Cefepime on 11/25/13.    Anemia- Situational and CKD- continue weekly aranesp. Iron studies from 9/17 w/ iron of 19, ferritin of 604, Tsat of 7%.   No IV Fe given serration infection Hypokalemia-  Stable, req intermittent replacement.   Hyponatremia- Stable. ,limit free water intake.  .  S/p CABG/MVR AFib on heparin, BB, amio po- In NSR, off Amiodarone gtt and on PO amio    Signed: Rexene Agent, MD 11/30/2013 8:43 AM

## 2013-11-30 NOTE — Progress Notes (Addendum)
CARDIAC REHAB PHASE I   PRE:  Rate/Rhythm: SR 74  BP:  Supine:   Sitting: 104/50  Standing:    SaO2: 95 RA  MODE:  Ambulation: stood transfer to [facility] back to bed   POST:  Rate/Rhythm:   BP:  Supine:   Sitting:   Standing:    SaO2:   Pt assisted up to bsc to void x 2 assist with the intent to walk.  After 3 attempts to stand after voiding, pt able long enough to pivot back to bed with help.  Peri care provided.   Pt states, "You don't think I am trying but I am.  This is all I can do".  Reassured and encouraged pt. Pt would greatly benefit from PT consult for increased progression of activity.  Call bell in place. Cherre Huger, BSN [phone]

## 2013-12-01 ENCOUNTER — Inpatient Hospital Stay (HOSPITAL_COMMUNITY): Payer: Medicare HMO

## 2013-12-01 DIAGNOSIS — G458 Other transient cerebral ischemic attacks and related syndromes: Secondary | ICD-10-CM

## 2013-12-01 DIAGNOSIS — G459 Transient cerebral ischemic attack, unspecified: Secondary | ICD-10-CM

## 2013-12-01 LAB — GLUCOSE, CAPILLARY
GLUCOSE-CAPILLARY: 56 mg/dL — AB (ref 70–99)
Glucose-Capillary: 144 mg/dL — ABNORMAL HIGH (ref 70–99)
Glucose-Capillary: 141 mg/dL — ABNORMAL HIGH (ref 70–99)
Glucose-Capillary: 166 mg/dL — ABNORMAL HIGH (ref 70–99)
Glucose-Capillary: 231 mg/dL — ABNORMAL HIGH (ref 70–99)
Glucose-Capillary: 150 mg/dL — ABNORMAL HIGH (ref 70–99)

## 2013-12-01 LAB — CBC
HCT: 33.5 % — ABNORMAL LOW (ref 36.0–46.0)
Hemoglobin: 10.9 g/dL — ABNORMAL LOW (ref 12.0–15.0)
MCH: 28.1 pg (ref 26.0–34.0)
MCHC: 32.5 g/dL (ref 30.0–36.0)
MCV: 86.3 fL (ref 78.0–100.0)
Platelets: 223 10*3/uL (ref 150–400)
RBC: 3.88 MIL/uL (ref 3.87–5.11)
RDW: 15.7 % — ABNORMAL HIGH (ref 11.5–15.5)
WBC: 7.8 10*3/uL (ref 4.0–10.5)

## 2013-12-01 LAB — URINALYSIS, ROUTINE W REFLEX MICROSCOPIC
Bilirubin Urine: NEGATIVE
Glucose, UA: 100 mg/dL — AB
Ketones, ur: NEGATIVE mg/dL
Nitrite: NEGATIVE
PH: 7.5 (ref 5.0–8.0)
PROTEIN: 30 mg/dL — AB
Specific Gravity, Urine: 1.008 (ref 1.005–1.030)
Urobilinogen, UA: 0.2 mg/dL (ref 0.0–1.0)

## 2013-12-01 LAB — URINE MICROSCOPIC-ADD ON

## 2013-12-01 LAB — BLOOD GAS, ARTERIAL
Acid-Base Excess: 5.4 mmol/L — ABNORMAL HIGH (ref 0.0–2.0)
BICARBONATE: 28.1 meq/L — AB (ref 20.0–24.0)
Drawn by: 27022
O2 CONTENT: 2 L/min
O2 Saturation: 98 %
PCO2 ART: 32.1 mmHg — AB (ref 35.0–45.0)
PO2 ART: 91.4 mmHg (ref 80.0–100.0)
Patient temperature: 98.6
TCO2: 29.1 mmol/L (ref 0–100)
pH, Arterial: 7.55 — ABNORMAL HIGH (ref 7.350–7.450)

## 2013-12-01 LAB — BASIC METABOLIC PANEL
Anion gap: 14 (ref 5–15)
BUN: 52 mg/dL — ABNORMAL HIGH (ref 6–23)
CO2: 31 mEq/L (ref 19–32)
Calcium: 8.8 mg/dL (ref 8.4–10.5)
Chloride: 82 mEq/L — ABNORMAL LOW (ref 96–112)
Creatinine, Ser: 2.83 mg/dL — ABNORMAL HIGH (ref 0.50–1.10)
GFR calc Af Amer: 17 mL/min — ABNORMAL LOW (ref >90)
GFR calc non Af Amer: 15 mL/min — ABNORMAL LOW (ref >90)
Glucose, Bld: 120 mg/dL — ABNORMAL HIGH (ref 70–99)
Potassium: 4 mEq/L (ref 3.7–5.3)
Sodium: 127 mEq/L — ABNORMAL LOW (ref 137–147)

## 2013-12-01 LAB — PROTIME-INR
INR: 2.16 — ABNORMAL HIGH (ref 0.00–1.49)
Prothrombin Time: 24.1 seconds — ABNORMAL HIGH (ref 11.6–15.2)

## 2013-12-01 MED ORDER — WARFARIN SODIUM 2 MG PO TABS
2.0000 mg | ORAL_TABLET | Freq: Every day | ORAL | Status: DC
  Administered 2013-12-01 – 2013-12-09 (×9): 2 mg via ORAL
  Filled 2013-12-01 (×10): qty 1

## 2013-12-01 MED ORDER — DEXTROSE 50 % IV SOLN
INTRAVENOUS | Status: AC
  Filled 2013-12-01: qty 50

## 2013-12-01 MED ORDER — GLUCOSE-VITAMIN C 4-6 GM-MG PO CHEW
CHEWABLE_TABLET | ORAL | Status: AC
  Filled 2013-12-01: qty 1

## 2013-12-01 MED ORDER — DEXTROSE 50 % IV SOLN: 25.0000 mL | Freq: Once | INTRAVENOUS | Status: DC | PRN

## 2013-12-01 MED ORDER — GLUCOSE-VITAMIN C 4-6 GM-MG PO CHEW: 4.0000 | CHEWABLE_TABLET | ORAL | Status: DC | PRN

## 2013-12-01 MED ORDER — INSULIN GLARGINE 100 UNIT/ML ~~LOC~~ SOLN
4.0000 [IU] | Freq: Two times a day (BID) | SUBCUTANEOUS | Status: DC
  Filled 2013-12-01 (×2): qty 0.04

## 2013-12-01 MED ORDER — DEXTROSE 50 % IV SOLN
25.0000 mL | Freq: Once | INTRAVENOUS | Status: AC
  Administered 2013-12-01: 25 mL via INTRAVENOUS
  Filled 2013-12-01: qty 50

## 2013-12-01 NOTE — Progress Notes (Addendum)
      [address]       [place] [address]             [phone]        23 Days Post-Op Procedure(s) (LRB): CORONARY ARTERY BYPASS GRAFTING (CABG), on pump, times two, using left internal mammary artery, cryo saphenous vein. (N/A) INTRAOPERATIVE TRANSESOPHAGEAL ECHOCARDIOGRAM (N/A) MITRAL VALVE (MV) REPLACEMENT (N/A)  Subjective: Patient has no specific complaints but she is not feeling well today.  Objective: Vital signs in last 24 hours: Temp:  [97.7 F (36.5 C)-97.9 F (36.6 C)] 97.8 F (36.6 C) (09/27 0456) Pulse Rate:  [73-79] 73 (09/27 0456) Cardiac Rhythm:  [-] Normal sinus rhythm (09/26 1945) Resp:  [16-17] 17 (09/27 0456) BP: (103-120)/(45-56) 112/56 mmHg (09/27 0456) SpO2:  [95 %-98 %] 96 % (09/27 0456) Weight:  [196 lb 6.9 oz (89.1 kg)] 196 lb 6.9 oz (89.1 kg) (09/27 0456)  Pre op weight 86.7 kg Current Weight  12/01/13 196 lb 6.9 oz (89.1 kg)      Intake/Output from previous day: 09/26 0701 - 09/27 0700 In: 678.4 [P.O.:480; I.V.:98.4; IV Piggyback:100] Out: 150 [Urine:150]   Physical Exam:  Cardiovascular: RRR. Pulmonary: Slightly diminished at bases; no rales, wheezes, or rhonchi. Abdomen: Soft, non tender, bowel sounds present. Extremities: Bilateral lower extremity edema. Wounds: Clean and dry.  No erythema or signs of infection. Small superficial sloughy ulcer like wound developing at left  inguinal fold. No drainage.  Lab Results: CBC:  Recent Labs  11/30/13 0430 12/01/13 0339  WBC 7.6 7.8  HGB 11.0* 10.9*  HCT 33.8* 33.5*  PLT 206 223   BMET:   Recent Labs  11/30/13 0430 12/01/13 0339  NA 125* 127*  K 4.0 4.0  CL 78* 82*  CO2 33* 31  GLUCOSE 120* 120*  BUN 55* 52*  CREATININE 2.73* 2.83*  CALCIUM 8.8 8.8    PT/INR:  Lab Results  Component Value Date   INR 2.16* 12/01/2013   INR 2.23* 11/30/2013   INR 3.29* 11/29/2013   ABG:  INR: Will add last result for INR, ABG once components are  confirmed Will add last 4 CBG results once components are confirmed  Assessment/Plan:  1. CV - SR in the 70's this am. On Amiodarone 200 bid, Lopressor 12.5 bid, and Coumadin. Also, on low dose Dopamine drip. INR decreased from 2.23 to 2.16. Continue with low dose Coumadin (2 mg) 2.  Pulmonary - Had leftt chest tube placed for recurrent pleural effusion on 9/24. Removed yesterday.Encourage incentive spirometer 3. ID- On Maxipime (day 11/14) for Serratia Marcescens bacteremia. Per pharmacy 4.  Acute blood loss anemia - H and H stable at 10.9 and 33.5. On Aranesp 5. Hyponatremia-sodium decreased to 125 6. ATN-creatinine slightly increased to 2.83. Per nephrology.  7.DM- CBGs 56/144/141.On Insulin -will decrease to avoid further hypoglycemia. 8. On Lasix 160 bid-per nephrology 9.Deconditioned-continue with PT and daily ambulation 10. Wound care consult for small wound developing proximal upper left groin meets abdomen. Apply dry 4x4 for now  ZIMMERMAN,DONIELLE MPA-C 12/01/2013,8:30 AM   Chart reviewed, patient examined, agree with above. She is not walking and eating minimally. She needs physical therapy.

## 2013-12-01 NOTE — Progress Notes (Addendum)
Nurse paged me because patient unable to recognize sister or talk. She had been hypoglycemic earlier but glucose just checked was 166. Sodium this am up to 127. She is able to move all 4 of her extremities, but is unable to talk. This is an acute change from this morning. I have examined her. She is able to move all extremities but is unable to talk. Her vital signs show HR in the low 80's BP 130's/60's. It appears she may  had a CVA. Code Stroke was called. She is going down to CT scan for a stat scan without contrast. I spoke with Dr. Doy Mince who will evaluate her. She will be transferred from 2W to 2S or 2H for closer monitoring. Dr. Cyndia Bent made aware of the preceding.   Chart reviewed, patient examined, agree with above. Head CT negative and her symptoms resolved after getting back from CT. Neurology does not feel that this was a neurologic event. She is still not very conversant but is oriented and talking some to family. She had a similar event overnight but glucose was low at 56. During the event today her glucose was normal and she remained hemodynamically stable. I don't know what the etiology of this is but will transfer back to 2S to observe closely.

## 2013-12-01 NOTE — Consult Note (Addendum)
Referring Physician: Prescott Gum    Chief Complaint: Confusion, difficulty with speech  HPI: Claudia [name] is an 80 y.o. 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  Has had a complicated recovery.  Today when family entered the room the patient was breathing fast and loud.  Patient was also unable to recognize family members.  Nursing was contacted at that time and code stroke was called.   Patient is on Coumadin with an INR of 2.16.    Date last known well: Date: 12/01/2013 Time last known well: Time: 09:30 tPA Given: No: On Coumadin with an elevated INR  Past Medical History  Diagnosis Date  . GERD (gastroesophageal reflux disease)   . PUD (peptic ulcer disease)   . Anemia   . Hypertension   . S/P endoscopy Aug 2011    3 superficial gastric ulcers, NSAID-induced  . S/P colonoscopy Sept 2011    left-sided diverticula, tubular adenoma  . Coronary artery disease   . Shortness of breath   . Diabetes mellitus     insulin dependent  . Headache(784.0)     rare migraines  . Cancer     hx of skin cancer  . Arthritis   . Osteopenia   . Hypercholesterolemia   . SIADH (syndrome of inappropriate ADH production)   . CHF (congestive heart failure)   . Myocardial infarction 2013    Past Surgical History  Procedure Laterality Date  . Tonsillectomy    . Appendectomy    . Eye surgery      cataracts  . Cardiac stents  07/19/2011  . Esophagogastroduodenoscopy  10/16/09    normal without barrett's/three superficial gastric ulcers  . Colonoscopy  12/04/09    normal rectum/left-sided diverticula  . Joint replacement  arthroscopy to knee  . Toe amputation Left 2013    left second toe amputation  . Cardiac catheterization  01/22/2013  . Coronary angioplasty  07/2011  . Coronary artery bypass graft N/A 11/08/2013    Procedure: CORONARY ARTERY BYPASS GRAFTING (CABG), on pump, times two, using left internal mammary artery, cryo saphenous vein.;  Surgeon: Ivin Poot, MD;  Location: [place];  Service: Open Heart Surgery;  Laterality: N/A;  LIMA-LAD CRYOVEIN -OM  . Intraoperative transesophageal echocardiogram N/A 11/08/2013    Procedure: INTRAOPERATIVE TRANSESOPHAGEAL ECHOCARDIOGRAM;  Surgeon: Ivin Poot, MD;  Location: [facility];  Service: Open Heart Surgery;  Laterality: N/A;  . Mitral valve replacement N/A 11/08/2013    Procedure: MITRAL VALVE (MV) REPLACEMENT;  Surgeon: Ivin Poot, MD;  Location: [place];  Service: Open Heart Surgery;  Laterality: N/A;  #25 MAGNA MITRAL EASE    Family history: Unable to obtain  Social History:  reports that she has never smoked. She has never used smokeless tobacco. She reports that she does not drink alcohol or use illicit drugs.  Allergies:  Allergies  Allergen Reactions  . Aspirin Other (See Comments)    High doses caused stomach bleeds    Medications:  I have reviewed the patient's current medications. Prior to Admission:  Prescriptions prior to admission  Medication Sig Dispense Refill  . acetaminophen (TYLENOL) 500 MG tablet Take 1,000 mg by mouth daily as needed (pain).      Marland Kitchen aspirin EC 81 MG tablet Take 81 mg by mouth daily.      . carvedilol (COREG) 6.25 MG tablet Take 3.125 mg by mouth 2 (two) times daily with a meal.       . furosemide (LASIX)  40 MG tablet Take 20 mg by mouth daily.      . insulin aspart (NOVOLOG FLEXPEN) 100 UNIT/ML FlexPen Inject 5-10 Units into the skin 3 (three) times daily with meals. Based on sliding scale      . insulin glargine (LANTUS SOLOSTAR) 100 UNIT/ML injection Inject 20 Units into the skin at bedtime.      . isosorbide mononitrate (IMDUR) 60 MG 24 hr tablet Take 1 tablet (60 mg total) by mouth daily.  30 tablet  3  . losartan (COZAAR) 50 MG tablet Take 25 mg by mouth daily.      . nitroGLYCERIN (NITROSTAT) 0.4 MG SL tablet Place 0.4 mg under the tongue every 5 (five) minutes as needed for chest pain.      . pantoprazole (PROTONIX) 40 MG tablet Take 1 tablet (40 mg total) by mouth 2 (two)  times daily before a meal.  60 tablet  3  . ranolazine (RANEXA) 500 MG 12 hr tablet Take 1 tablet (500 mg total) by mouth 2 (two) times daily.  60 tablet  1  . simvastatin (ZOCOR) 20 MG tablet Take 1 tablet (20 mg total) by mouth every evening.  90 tablet  1  . Vorapaxar Sulfate 2.08 MG TABS Take 2.08 mg by mouth daily.      . B-D ULTRAFINE III SHORT PEN 31G X 8 MM MISC USE AS DIRECTED WITH LANTUS PEN  100 each  5  . Blood Glucose Monitoring Suppl (ACCU-CHEK NANO SMARTVIEW) W/DEVICE KIT Pt needs monitor, strips (box of 400/ 4 refill), lancets (box of 400/4 refills) She checks her BS qid (before meals & QHS) Dx: 250.00  1 kit  0   Scheduled: . amiodarone  200 mg Oral BID  . ceFEPime (MAXIPIME) IV  2 g Intravenous Q24H  . citalopram  20 mg Oral Daily  . darbepoetin (ARANESP) injection - NON-DIALYSIS  100 mcg Subcutaneous Q Wed-1800  . feeding supplement (GLUCERNA SHAKE)  237 mL Oral Q1500  . furosemide  160 mg Oral BID  . glucose-Vitamin C      . insulin aspart  0-24 Units Subcutaneous TID AC & HS  . magic mouthwash  5 mL Oral TID  . metoprolol tartrate  12.5 mg Oral BID   Or  . metoprolol tartrate  12.5 mg Per Tube BID  . neomycin-bacitracin-polymyxin   Topical Daily  . nystatin cream   Topical TID  . pantoprazole  40 mg Oral Daily  . sodium chloride  10-40 mL Intracatheter Q12H  . sodium chloride  3 mL Intravenous Q12H  . warfarin  2 mg Oral q1800  . Warfarin - Physician Dosing Inpatient   Does not apply q1800    ROS: Unable to obtain  Physical Examination: Blood pressure 135/60, pulse 77, temperature 97.8 F (36.6 C), temperature source Oral, resp. rate 17, height _0  (1.702 m), weight 89.1 kg (196 lb 6.9 oz), SpO2 96.00%.  Neurologic Examination: Mental Status: Alert.  Follows simple commands.  Mute with no attempts at speech.  Hyperventilating.   Cranial Nerves: II: Discs flat bilaterally; Visual fields grossly normal, pupils equal, round, reactive to light and  accommodation III,IV, VI: ptosis not present, extra-ocular motions intact bilaterally V,VII: smile symmetric, facial light touch sensation normal bilaterally VIII: hearing normal bilaterally IX,X: gag reflex present XI: bilateral shoulder shrug XII: midline tongue extension Motor: Moves the upper extremities off the bed although weakly.  Does not lift either leg off the bed.   Sensory: Pinprick and light  touch intact throughout, bilaterally Deep Tendon Reflexes: 2+ in the upper extremities and absent in the lower extremities Plantars: Right: mute   Left: mute Cerebellar: Unable to test Gait: Unable to test CV: pulses palpable throughout     Laboratory Studies:  Basic Metabolic Panel:  Recent Labs Lab 11/26/13 0500 11/27/13 0542 11/28/13 0500 11/29/13 0415 11/30/13 0430 12/01/13 0339  NA 122* 124* 127* 128* 125* 127*  K 3.8 3.9 4.0 4.4 4.0 4.0  CL 74* 75* 79* 81* 78* 82*  CO2 33* 31 30 34* 33* 31  GLUCOSE 150* 157* 119* 97 120* 120*  BUN 64* 59* 57* 59* 55* 52*  CREATININE 2.51* 2.40* 2.51* 2.63* 2.73* 2.83*  CALCIUM 8.8 8.8 8.8 8.6 8.8 8.8  MG 1.8  --   --   --   --   --     Liver Function Tests: No results found for this basename: AST, ALT, ALKPHOS, BILITOT, PROT, ALBUMIN,  in the last 168 hours No results found for this basename: LIPASE, AMYLASE,  in the last 168 hours No results found for this basename: AMMONIA,  in the last 168 hours  CBC:  Recent Labs Lab 11/27/13 0542 11/28/13 0500 11/29/13 0415 11/30/13 0430 12/01/13 0339  WBC 8.8 7.8 7.4 7.6 7.8  HGB 10.5* 11.0* 11.4* 11.0* 10.9*  HCT 32.1* 33.0* 34.6* 33.8* 33.5*  MCV 88.7 85.7 86.1 88.5 86.3  PLT 177 175 203 206 223    Cardiac Enzymes: No results found for this basename: CKTOTAL, CKMB, CKMBINDEX, TROPONINI,  in the last 168 hours  BNP: No components found with this basename: POCBNP,   CBG:  Recent Labs Lab 11/30/13 2101 12/01/13 0123 12/01/13 0155 12/01/13 0626 12/01/13 1118   GLUCAP 202* 56* 144* 141* 166*    Microbiology: Results for orders placed during the hospital encounter of 11/05/13  MRSA PCR SCREENING     Status: None   Collection Time    11/05/13  1:28 PM      Result Value Ref Range Status   MRSA by PCR NEGATIVE  NEGATIVE Final   Comment:            The GeneXpert MRSA Assay (FDA     approved for NASAL specimens     only), is one component of a     comprehensive MRSA colonization     surveillance program. It is not     intended to diagnose MRSA     infection nor to guide or     monitor treatment for     MRSA infections.  SURGICAL PCR SCREEN     Status: None   Collection Time    11/06/13  8:13 AM      Result Value Ref Range Status   MRSA, PCR NEGATIVE  NEGATIVE Final   Staphylococcus aureus NEGATIVE  NEGATIVE Final   Comment:            The Xpert SA Assay (FDA     approved for NASAL specimens     in patients over 35 years of age),     is one component of     a comprehensive surveillance     program.  Test performance has     been validated by  [employer] for patients greater     than or equal to 51 year old.     It is not intended     to diagnose infection nor to     guide or monitor treatment.  CULTURE, BLOOD (SINGLE)     Status: None   Collection Time    11/16/13  9:15 AM      Result Value Ref Range Status   Specimen Description BLOOD LEFT ARM   Final   Special Requests BOTTLES DRAWN AEROBIC ONLY 4CC   Final   Culture  Setup Time     Final   Value: 11/16/2013 15:20     Performed at [facility]   Culture     Final   Value: NO GROWTH 5 DAYS     Performed at [facility]   Report Status 11/22/2013 FINAL   Final  URINE CULTURE     Status: None   Collection Time    11/16/13  9:36 AM      Result Value Ref Range Status   Specimen Description URINE, CATHETERIZED   Final   Special Requests Normal   Final   Culture  Setup Time     Final   Value: 11/16/2013 19:34     Performed at [facility]     Final   Value: NO GROWTH     Performed at [facility]   Culture     Final   Value: NO GROWTH     Performed at [facility]   Report Status 11/17/2013 FINAL   Final  CULTURE, RESPIRATORY (NON-EXPECTORATED)     Status: None   Collection Time    11/21/13  9:00 AM      Result Value Ref Range Status   Specimen Description TRACHEAL ASPIRATE   Final   Special Requests Normal   Final   Gram Stain     Final   Value: MODERATE WBC PRESENT,BOTH PMN AND MONONUCLEAR     RARE SQUAMOUS EPITHELIAL CELLS PRESENT     NO ORGANISMS SEEN     Performed at [facility]   Culture     Final   Value: FEW SERRATIA MARCESCENS     Performed at [facility]   Report Status 11/26/2013 FINAL   Final   Organism ID, Bacteria SERRATIA MARCESCENS   Final  CULTURE, BLOOD (ROUTINE X 2)     Status: None   Collection Time    11/21/13  9:35 AM      Result Value Ref Range Status   Specimen Description BLOOD RIGHT ARM   Final   Special Requests BOTTLES DRAWN AEROBIC ONLY 5CC   Final   Culture  Setup Time     Final   Value: 11/21/2013 14:44     Performed at [facility]   Culture     Final   Value: SERRATIA MARCESCENS     Note: Gram Stain Report Called to,Read Back By and Verified With: JESSIE SUTTER 11/23/13 AT 0445 RIDK     Performed at [facility]   Report Status 11/26/2013 FINAL   Final   Organism ID, Bacteria SERRATIA MARCESCENS   Final  CULTURE, BLOOD (ROUTINE X 2)     Status: None   Collection Time    11/21/13  9:40 AM      Result Value Ref Range Status   Specimen Description BLOOD RIGHT FOREARM   Final   Special Requests BOTTLES DRAWN AEROBIC ONLY 5CC   Final   Culture  Setup Time     Final   Value: 11/21/2013 14:43     Performed at [facility]     Final  Value: SERRATIA MARCESCENS     Note: SUSCEPTIBILITIES PERFORMED ON PREVIOUS CULTURE WITHIN THE LAST 5 DAYS.     Note: Gram Stain Report Called to,Read Back By and  Verified With: JESSIE SUTTER 11/23/13 AT 11 [place]     Performed at [facility]   Report Status 11/25/2013 FINAL   Final  URINE CULTURE     Status: None   Collection Time    11/21/13 10:30 AM      Result Value Ref Range Status   Specimen Description URINE, CATHETERIZED   Final   Special Requests has completed 10 days of Ancef Normal   Final   Culture  Setup Time     Final   Value: 11/21/2013 17:18     Performed at [place]     Final   Value: 9,000 COLONIES/ML     Performed at [facility]   Culture     Final   Value: INSIGNIFICANT GROWTH     Performed at [facility]   Report Status 11/23/2013 FINAL   Final  CLOSTRIDIUM DIFFICILE BY PCR     Status: None   Collection Time    11/21/13  9:00 PM      Result Value Ref Range Status   C difficile by pcr NEGATIVE  NEGATIVE Final  BODY FLUID CULTURE     Status: None   Collection Time    11/22/13 11:02 AM      Result Value Ref Range Status   Specimen Description PLEURAL FLUID RIGHT   Final   Special Requests 60 ML FLUID   Final   Gram Stain     Final   Value: NO WBC SEEN     NO ORGANISMS SEEN     Performed at [facility]   Culture     Final   Value: NO GROWTH 3 DAYS     Performed at [facility]   Report Status 11/26/2013 FINAL   Final    Coagulation Studies:  Recent Labs  11/29/13 0415 11/30/13 0430 12/01/13 0339  LABPROT 33.5* 24.7* 24.1*  INR 3.29* 2.23* 2.16*    Urinalysis:  Recent Labs Lab 12/01/13 1126  COLORURINE YELLOW  LABSPEC 1.008  PHURINE 7.5  GLUCOSEU 100*  HGBUR SMALL*  BILIRUBINUR NEGATIVE  KETONESUR NEGATIVE  PROTEINUR 30*  UROBILINOGEN 0.2  NITRITE NEGATIVE  LEUKOCYTESUR SMALL*    Lipid Panel:    Component Value Date/Time   CHOL 125 12/03/2012 0355   TRIG 42 12/03/2012 0355   HDL 72 12/03/2012 0355   CHOLHDL 1.7 12/03/2012 0355   VLDL 8 12/03/2012 0355   LDLCALC 45 12/03/2012 0355    HgbA1C:  Lab Results  Component  Value Date   HGBA1C 7.2* 11/06/2013    Urine Drug Screen:   No results found for this basename: labopia, cocainscrnur, labbenz, amphetmu, thcu, labbarb    Alcohol Level: No results found for this basename: ETH,  in the last 168 hours   Imaging: Dg Chest 2 View  11/30/2013   CLINICAL DATA:  Mitral valve replacement  EXAM: CHEST - 2 VIEW  COMPARISON:  11/29/2013  FINDINGS: Right IJ PICC line tip remains at the SVC RA junction. Prior coronary bypass and mitral valve placement noted. Mild cardiomegaly with perihilar and bibasilar scattered atelectasis persisting. Left base chest tube removed. No pneumothorax. Trachea midline. Trace posterior effusions on the lateral view.  IMPRESSION: Stable cardiomegaly and bilateral atelectasis.  Trace pleural effusions  Electronically Signed   By: Daryll Brod M.D.   On: 11/30/2013 08:40   Ct Head Wo Contrast  12/01/2013   CLINICAL DATA:  Code stroke. Altered mental status. Question of stroke. Dyspnea. Low oxygen saturations.  EXAM: CT HEAD WITHOUT CONTRAST  TECHNIQUE: Contiguous axial images were obtained from the base of the skull through the vertex without intravenous contrast.  COMPARISON:  None.  FINDINGS: There is central and cortical atrophy. Periventricular right matter changes are consistent with small vessel disease. There is no intra or extra-axial fluid collection or mass lesion. The basilar cisterns and ventricles have a normal appearance. There is no CT evidence for acute infarction or hemorrhage. There is dense atherosclerotic calcification of the internal carotid arteries. No calvarial fracture. Paranasal and mastoid air cells are normally aerated.  IMPRESSION: 1. Atrophy and small vessel disease. 2.  No evidence for acute intracranial abnormality. 3. Critical Value/emergent results were called by telephone at the time of interpretation on 12/01/2013 at 12:29 pm to Dr. Doy Mince, who verbally acknowledged these results.   Electronically Signed   By: Shon Hale M.D.   On: 12/01/2013 12:29    Assessment: 80 y.o. female s/p CABG and MVR who today experienced an episode of hyperventilation and mutism.  Patient on Coumadin with a therapeutic INR.  Patient is now back to baseline and reports that she was feeling short of breath during the episode.  Also reports that she remembers nothing else about the episode.  Exam somewhat inconsistent with an acute infarct.  Head CT reviewed and shows no acute changes.  Patient not a tPA candidate due to elevated INR.  Patient now back to baseline and not an intervention candidate.     Stroke Risk Factors - diabetes mellitus, hyperlipidemia and hypertension  Plan: 1. Prophylactic therapy-Anticoagulation: continue Coumadin 2. Telemetry monitoring 3. Frequent neuro checks 4. Agree with continued addressing of metabolic issues-decreased sodium, renal insufficiency   5. EEG   Alexis Goodell, MD [facility] [phone] 12/01/2013, 12:37 PM

## 2013-12-01 NOTE — Significant Event (Signed)
Rapid Response Event Note  Overview:    Called to see patient at 1145 for sudden onset tachypnea and altered mental status. Last seen normal reported at 0930 by RN.  Initial Focused Assessment: Upon arrival PA and RNs at bedside. Patient is tachypneic with rapid shallow respirations. Breath sounds to anterior auscultation are clear. Poor response to verbal requests/exam/stimulation. NIHSS performed with poor cooperation. VSS.  Interventions: COde Stroke called. Patient transported to CT in bed. Dr. Doy Mince at bedside upon return. Of note: during CT scan in prone position, O2 Sats dropped into 80s with brief drop into 70s. Applied O2 a 4LPM, repositioned in bed, and Sats rose. O2 weaned off by the time she was back in her 2W room.    Event Summary:  Will assist as necessary. Transfer to 2S being arranged.   at      at          Baron Hamper

## 2013-12-01 NOTE — Progress Notes (Signed)
Pt attempting to get out of bed, states she has to use the bathroom.  Assisted to [facility] and back to bed.  Pt lethargic and confused.  VSS (in flowsheet), Neuro intact other than not knowing where she is and why.  CBG checked, 56.  Hypoglycemic protocol initiated.  Will continue to monitor closely.

## 2013-12-01 NOTE — Progress Notes (Signed)
Subjective:   No new events At least 3 unmeasured voids Not ambulating very much; seems volitional Remains on dopamin gtt Doesn't feel as well; not as engaging today  Objective Vital signs in last 24 hours: Filed Vitals:   11/30/13 1426 11/30/13 2055 12/01/13 0112 12/01/13 0456  BP: 103/45 120/49 110/46 112/56  Pulse: 79 79 74 73  Temp: 97.7 F (36.5 C) 97.7 F (36.5 C) 97.9 F (36.6 C) 97.8 F (36.6 C)  TempSrc: Oral Oral Oral Oral  Resp: 17 17 16 17   Height:      Weight:    89.1 kg (196 lb 6.9 oz)  SpO2: 98% 98% 95% 96%   Weight change: 0.3 kg (10.6 oz)  Intake/Output Summary (Last 24 hours) at 12/01/13 0719 Last data filed at 11/30/13 1856  Gross per 24 hour  Intake  678.4 ml  Output    150 ml  Net  528.4 ml   Physical Exam:  NAD RRR CTAb ant auscultation tarce to 1+ LEE No rashes/lesions  Labs: Basic Metabolic Panel:  Recent Labs Lab 11/29/13 0415 11/30/13 0430 12/01/13 0339  NA 128* 125* 127*  K 4.4 4.0 4.0  CL 81* 78* 82*  CO2 34* 33* 31  GLUCOSE 97 120* 120*  BUN 59* 55* 52*  CREATININE 2.63* 2.73* 2.83*  CALCIUM 8.6 8.8 8.8     Recent Labs Lab 11/27/13 0542 11/28/13 0500 11/29/13 0415 11/30/13 0430 12/01/13 0339  WBC 8.8 7.8 7.4 7.6 7.8  HGB 10.5* 11.0* 11.4* 11.0* 10.9*  HCT 32.1* 33.0* 34.6* 33.8* 33.5*  MCV 88.7 85.7 86.1 88.5 86.3  PLT 177 175 203 206 223    Recent Labs Lab 11/30/13 1622 11/30/13 2101 12/01/13 0123 12/01/13 0155 12/01/13 0626  GLUCAP 132* 202* 56* 144* 141*    Medications: Infusions: . sodium chloride 10 mL/hr at 11/29/13 0700  . sodium chloride 250 mL (11/21/13 1541)  . DOPamine 2.5 mcg/kg/min (11/30/13 0550)    Scheduled Medications: . amiodarone  200 mg Oral BID  . ceFEPime (MAXIPIME) IV  2 g Intravenous Q24H  . citalopram  20 mg Oral Daily  . darbepoetin (ARANESP) injection - NON-DIALYSIS  100 mcg Subcutaneous Q Wed-1800  . feeding supplement (GLUCERNA SHAKE)  237 mL Oral Q1500  .  furosemide  160 mg Oral BID  . glucose-Vitamin C      . insulin aspart  0-24 Units Subcutaneous TID AC & HS  . insulin glargine  8 Units Subcutaneous BID  . magic mouthwash  5 mL Oral TID  . metoprolol tartrate  12.5 mg Oral BID   Or  . metoprolol tartrate  12.5 mg Per Tube BID  . neomycin-bacitracin-polymyxin   Topical Daily  . nystatin cream   Topical TID  . pantoprazole  40 mg Oral Daily  . sodium chloride  10-40 mL Intracatheter Q12H  . sodium chloride  3 mL Intravenous Q12H  . warfarin  1 mg Oral q1800  . Warfarin - Physician Dosing Inpatient   Does not apply q59    Background 80 year old female with multiple medical issues including stage III CKD who is status post CABG and mitral valve replacement with postoperative acute on chronic renal failure (baseline creatinine around 1.5), marked volume overload (50 lb +; improved and near preoperative weight)  Assessment/Recommendations  Renal- GFR largely stable.  Now on PO diuretics, weight up a little but different scal.  No chagne to diuretics today.   Left Pleural Effusion- Recent chest tube (9/24). CT surgery following  Hypertension/volume- Improving. Continue diuretic program as above. Follow daily weights, UOP  Serratia Bacteremia- Tracheal aspirate and blood cultures 2/2 positive for serratia marcescens. Transitioned from Zosyn to Cefepime on 11/25/13.    Anemia- Situational and CKD- continue weekly aranesp. Iron studies from 9/17 w/ iron of 19, ferritin of 604, Tsat of 7%.   No IV Fe given serration infection Hypokalemia-  Stable, req intermittent replacement.   Hyponatremia- Stable. ,limit free water intake.  .  S/p CABG/MVR AFib on heparin, BB, amio po- In NSR, off Amiodarone gtt and on PO amio    Signed: Rexene Agent, MD 12/01/2013 7:19 AM

## 2013-12-01 NOTE — Progress Notes (Addendum)
Clinical Social Work Department CLINICAL SOCIAL WORK PLACEMENT NOTE 12/01/2013  Patient:  Claudia [name], Claudia [name]  Account Number:  [id number] Admit date:  11/05/2013  Clinical Social Worker:  Berton Mount, Latanya Presser  Date/time:  12/01/2013 09:30 AM  Clinical Social Work is seeking post-discharge placement for this patient at the following level of care:   SKILLED NURSING   (*CSW will update this form in Epic as items are completed)   11/15/2013  Patient/family provided with [facility] Department of Clinical Social Work's list of facilities offering this level of care within the geographic area requested by the patient (or if unable, by the patient's family).  11/15/2013  Patient/family informed of their freedom to choose among providers that offer the needed level of care, that participate in Medicare, Medicaid or managed care program needed by the patient, have an available bed and are willing to accept the patient.  11/15/2013  Patient/family informed of [facility]' ownership interest in [facility], as well as of the fact that they are under no obligation to receive care at this facility.  PASARR submitted to EDS on 12/01/2013 PASARR number received on 12/01/2013  FL2 transmitted to all facilities in geographic area requested by pt/family on  12/01/2013 FL2 transmitted to all facilities within larger geographic area on   Patient informed that his/her managed care company has contracts with or will negotiate with  certain facilities, including the following:     Patient/family informed of bed offers received:  12/11/2013 Patient chooses bed at [place] [facility] Physician recommends and patient chooses bed at    Patient to be transferred to [place] [facility] on  12/13/2013   Patient to be transferred to facility by  [facility] Patient and family notified of transfer on  12/13/2013 Name of family member notified:  Roselie Awkward (RN to call patient son about dc plans)  The following  physician request were entered in Epic: Physician Request  Please sign FL2.    Additional CommentsBerton Mount, [place]

## 2013-12-01 NOTE — Progress Notes (Signed)
Went in pts room to administer medications. Noticed pt voided on her gown and chair. Got pt up to [facility] and bathed patient. She was able to follow all directives as Mordecai Rasmussen and myself changed her gown and linens. She urinated on the [facility]. Noticed the urine had an odor. Donielle Tacy Dura was notified and ordered a UA with C&S. She was able to say her name and DOB, but was not very verbal or able to answer or reply to any other questions. We got the pt dressed and back to bed. She became very tachypenic. VS checked: o2 stat 100%, BP 135/60 HR 77 CBG 166. Family member arrived and informed us that pt was unable to identify who she was. Paged Lars Pinks, PA regarding acute change in mentation.  Donielle arrived and assessed pt. Ordered to call rapid response and call code stroke

## 2013-12-01 NOTE — Progress Notes (Signed)
Pt arrived from 2W via bed.  Pt on 2L [place], NSR; BP 144/60, HR 77, O2 sat 100, RR 16;  Pt reoriented to unit.

## 2013-12-01 NOTE — Progress Notes (Signed)
Hypoglycemic Event  CBG: 56  Treatment: D50 8ml   Symptoms: pt lethargic & confused  Follow-up CBG: Time: 0155 CBG Result: 144  Possible Reasons for Event: Lantus restarted last night. Pt receiving SSI and Lantus.      Claudia [name] A  Remember to initiate Hypoglycemia Order Set & complete

## 2013-12-02 ENCOUNTER — Inpatient Hospital Stay (HOSPITAL_COMMUNITY): Payer: Medicare HMO

## 2013-12-02 DIAGNOSIS — E871 Hypo-osmolality and hyponatremia: Secondary | ICD-10-CM

## 2013-12-02 DIAGNOSIS — I200 Unstable angina: Secondary | ICD-10-CM

## 2013-12-02 DIAGNOSIS — N179 Acute kidney failure, unspecified: Secondary | ICD-10-CM

## 2013-12-02 LAB — CBC
HCT: 33.6 % — ABNORMAL LOW (ref 36.0–46.0)
Hemoglobin: 10.8 g/dL — ABNORMAL LOW (ref 12.0–15.0)
MCH: 27.8 pg (ref 26.0–34.0)
MCHC: 32.1 g/dL (ref 30.0–36.0)
MCV: 86.4 fL (ref 78.0–100.0)
Platelets: 231 10*3/uL (ref 150–400)
RBC: 3.89 MIL/uL (ref 3.87–5.11)
RDW: 15.6 % — ABNORMAL HIGH (ref 11.5–15.5)
WBC: 6.9 10*3/uL (ref 4.0–10.5)

## 2013-12-02 LAB — PROTIME-INR
INR: 2.09 — ABNORMAL HIGH (ref 0.00–1.49)
Prothrombin Time: 23.5 seconds — ABNORMAL HIGH (ref 11.6–15.2)

## 2013-12-02 LAB — GLUCOSE, CAPILLARY
GLUCOSE-CAPILLARY: 193 mg/dL — AB (ref 70–99)
GLUCOSE-CAPILLARY: 200 mg/dL — AB (ref 70–99)
GLUCOSE-CAPILLARY: 172 mg/dL — AB (ref 70–99)
Glucose-Capillary: 218 mg/dL — ABNORMAL HIGH (ref 70–99)
Glucose-Capillary: 136 mg/dL — ABNORMAL HIGH (ref 70–99)

## 2013-12-02 LAB — BASIC METABOLIC PANEL
ANION GAP: 14 (ref 5–15)
BUN: 46 mg/dL — ABNORMAL HIGH (ref 6–23)
CHLORIDE: 85 meq/L — AB (ref 96–112)
CO2: 30 meq/L (ref 19–32)
CREATININE: 2.62 mg/dL — AB (ref 0.50–1.10)
Calcium: 8.6 mg/dL (ref 8.4–10.5)
GFR calc non Af Amer: 16 mL/min — ABNORMAL LOW (ref >90)
GFR, EST AFRICAN AMERICAN: 19 mL/min — AB (ref >90)
Glucose, Bld: 152 mg/dL — ABNORMAL HIGH (ref 70–99)
Potassium: 3.5 mEq/L — ABNORMAL LOW (ref 3.7–5.3)
SODIUM: 129 meq/L — AB (ref 137–147)

## 2013-12-02 MED ORDER — SODIUM CHLORIDE 0.9 % IV SOLN
200.0000 mg | Freq: Once | INTRAVENOUS | Status: AC
  Administered 2013-12-02: 200 mg via INTRAVENOUS
  Filled 2013-12-02: qty 20

## 2013-12-02 MED ORDER — SODIUM CHLORIDE 0.9 % IV SOLN
INTRAVENOUS | Status: DC
  Administered 2013-12-02: 18:00:00 via INTRAVENOUS
  Administered 2013-12-04: 30 mL/h via INTRAVENOUS
  Administered 2013-12-05 – 2013-12-08 (×3): via INTRAVENOUS
  Administered 2013-12-08: 50 mL/h via INTRAVENOUS
  Administered 2013-12-09: 08:00:00 via INTRAVENOUS

## 2013-12-02 MED ORDER — INSULIN ASPART 100 UNIT/ML ~~LOC~~ SOLN
0.0000 [IU] | SUBCUTANEOUS | Status: DC
  Administered 2013-12-02: 4 [IU] via SUBCUTANEOUS
  Administered 2013-12-03: 2 [IU] via SUBCUTANEOUS
  Administered 2013-12-03: 4 [IU] via SUBCUTANEOUS
  Administered 2013-12-04: 2 [IU] via SUBCUTANEOUS
  Administered 2013-12-04: 8 [IU] via SUBCUTANEOUS
  Administered 2013-12-04 (×2): 2 [IU] via SUBCUTANEOUS
  Administered 2013-12-04: 12 [IU] via SUBCUTANEOUS
  Administered 2013-12-04 – 2013-12-05 (×2): 8 [IU] via SUBCUTANEOUS
  Administered 2013-12-05: 2 [IU] via SUBCUTANEOUS
  Administered 2013-12-06 (×2): 4 [IU] via SUBCUTANEOUS
  Administered 2013-12-06: 2 [IU] via SUBCUTANEOUS
  Administered 2013-12-06: 8 [IU] via SUBCUTANEOUS
  Administered 2013-12-06: 08:00:00 via SUBCUTANEOUS
  Administered 2013-12-07: 2 [IU] via SUBCUTANEOUS
  Administered 2013-12-07: 4 [IU] via SUBCUTANEOUS
  Administered 2013-12-07: 8 [IU] via SUBCUTANEOUS
  Administered 2013-12-07: 2 [IU] via SUBCUTANEOUS
  Administered 2013-12-07: 4 [IU] via SUBCUTANEOUS
  Administered 2013-12-08: 12 [IU] via SUBCUTANEOUS
  Administered 2013-12-08: 8 [IU] via SUBCUTANEOUS
  Administered 2013-12-08: 2 [IU] via SUBCUTANEOUS
  Administered 2013-12-08: 4 [IU] via SUBCUTANEOUS
  Administered 2013-12-08: 2 [IU] via SUBCUTANEOUS
  Administered 2013-12-09: 8 [IU] via SUBCUTANEOUS
  Administered 2013-12-09 (×2): 4 [IU] via SUBCUTANEOUS
  Administered 2013-12-09: 2 [IU] via SUBCUTANEOUS
  Administered 2013-12-09 – 2013-12-10 (×2): 8 [IU] via SUBCUTANEOUS
  Administered 2013-12-10: 4 [IU] via SUBCUTANEOUS

## 2013-12-02 MED ORDER — SODIUM CHLORIDE 0.9 % IV SOLN
50.0000 mg | Freq: Two times a day (BID) | INTRAVENOUS | Status: DC
  Administered 2013-12-03: 50 mg via INTRAVENOUS
  Filled 2013-12-02 (×2): qty 5

## 2013-12-02 MED ORDER — POTASSIUM CHLORIDE CRYS ER 20 MEQ PO TBCR
40.0000 meq | EXTENDED_RELEASE_TABLET | Freq: Once | ORAL | Status: AC
  Administered 2013-12-02: 40 meq via ORAL
  Filled 2013-12-02: qty 2

## 2013-12-02 NOTE — Consult Note (Addendum)
WOC wound consult note Reason for Consult: Consult requested for left groin and left heel.   Wound type: Left groin with full thickness wound; appears to be over the site of a previous vascular procedure, appearance consistent with frequent moisture and candidiasis.  Antifungal cream has been ordered.   Pressure Ulcer POA: This is NOT a pressure ulcer Measurement: .5X1X.1cm Wound bed: 90% yellow, 10% red Drainage (amount, consistency, odor) mod amt yellow drainage, no odor Periwound: Intact skin surrounding is red and moist in groin fold Dressing procedure/placement/frequency: Foam dressing to absorb drainage and promote healing.  Left heel with deep tissue injury 1X3 cm dark purple without open area or drainage.  Pt already in Prevalon boots to reduce pressure to site. Please re-consult if further assistance is needed.  Thank-you,  Julien Girt MSN, [place], [place], [place], [place]

## 2013-12-02 NOTE — Progress Notes (Signed)
EEG completed, results pending. 

## 2013-12-02 NOTE — Progress Notes (Signed)
Page sent to Neurology on call Doy Mince) to update on neuro status.

## 2013-12-02 NOTE — Progress Notes (Signed)
STROKE TEAM PROGRESS NOTE   HISTORY Claudia [name] is an 80 y.o. 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 Has had a complicated recovery. Today when family entered the room the patient was breathing fast and loud. Patient was also unable to recognize family members. Nursing was contacted at that time and code stroke was called.  Patient is on Coumadin with an INR of 2.16.  Date last known well: Date: 12/01/2013  Time last known well: Time: 09:30  tPA Given: No: On Coumadin with an elevated INR   SUBJECTIVE (INTERVAL HISTORY) Her RN is at the bedside.  Overall she feels her condition is stable. Had EEG earlier today, more consistent with metabolic encephalopathy but also there is concern for NCSE. She was loaded with vimpat.    OBJECTIVE Temp:  [97.4 F (36.3 C)-98.3 F (36.8 C)] 97.4 F (36.3 C) (09/28 2000) Pulse Rate:  [66-79] 73 (09/28 2300) Cardiac Rhythm:  [-] Normal sinus rhythm (09/28 2300) Resp:  [8-31] 16 (09/28 2300) BP: (98-143)/(43-103) 122/49 mmHg (09/28 2300) SpO2:  [94 %-100 %] 96 % (09/28 2300) Weight:  [188 lb 4.4 oz (85.4 kg)] 188 lb 4.4 oz (85.4 kg) (09/28 0600)   Recent Labs Lab 12/01/13 2218 12/02/13 0941 12/02/13 1233 12/02/13 1648 12/02/13 2140  GLUCAP 150* 200* 218* 136* 172*    Recent Labs Lab 11/26/13 0500  11/28/13 0500 11/29/13 0415 11/30/13 0430 12/01/13 0339 12/02/13 0416  NA 122*  < > 127* 128* 125* 127* 129*  K 3.8  < > 4.0 4.4 4.0 4.0 3.5*  CL 74*  < > 79* 81* 78* 82* 85*  CO2 33*  < > 30 34* 33* 31 30  GLUCOSE 150*  < > 119* 97 120* 120* 152*  BUN 64*  < > 57* 59* 55* 52* 46*  CREATININE 2.51*  < > 2.51* 2.63* 2.73* 2.83* 2.62*  CALCIUM 8.8  < > 8.8 8.6 8.8 8.8 8.6  MG 1.8  --   --   --   --   --   --   < > = values in this interval not displayed. No results found for this basename: AST, ALT, ALKPHOS, BILITOT, PROT, ALBUMIN,  in the last 168 hours  Recent Labs Lab 11/28/13 0500 11/29/13 0415 11/30/13 0430  12/01/13 0339 12/02/13 0416  WBC 7.8 7.4 7.6 7.8 6.9  HGB 11.0* 11.4* 11.0* 10.9* 10.8*  HCT 33.0* 34.6* 33.8* 33.5* 33.6*  MCV 85.7 86.1 88.5 86.3 86.4  PLT 175 203 206 223 231   No results found for this basename: CKTOTAL, CKMB, CKMBINDEX, TROPONINI,  in the last 168 hours  Recent Labs  11/30/13 0430 12/01/13 0339 12/02/13 0416  LABPROT 24.7* 24.1* 23.5*  INR 2.23* 2.16* 2.09*    Recent Labs  12/01/13 1126  COLORURINE YELLOW  LABSPEC 1.008  PHURINE 7.5  GLUCOSEU 100*  HGBUR SMALL*  BILIRUBINUR NEGATIVE  KETONESUR NEGATIVE  PROTEINUR 30*  UROBILINOGEN 0.2  NITRITE NEGATIVE  LEUKOCYTESUR SMALL*       Component Value Date/Time   CHOL 125 12/03/2012 0355   TRIG 42 12/03/2012 0355   HDL 72 12/03/2012 0355   CHOLHDL 1.7 12/03/2012 0355   VLDL 8 12/03/2012 0355   LDLCALC 45 12/03/2012 0355   Lab Results  Component Value Date   HGBA1C 7.2* 11/06/2013   No results found for this basename: labopia, cocainscrnur, labbenz, amphetmu, thcu, labbarb    No results found for this basename: ETH,  in the last 168 hours  Ct Head Wo Contrast  12/01/2013   IMPRESSION: 1. Atrophy and small vessel disease. 2.  No evidence for acute intracranial abnormality.      Dg Chest Port 1 View  12/02/2013    IMPRESSION: Stable appearance of the chest when compare with the prior exam.      12/01/2013   IMPRESSION: Enlargement of cardiac silhouette post MVR.  Bibasilar atelectasis with additional atelectasis or scarring in RIGHT upper lobe.      CUS - 11/05/13 - Bilateral: 1-39% ICA stenosis. Vertebral artery flow is antegrade.  2D echo - 11/05/13 - - Left ventricle: The cavity size was normal. There was mild concentric hypertrophy. Systolic function was normal. The estimated ejection fraction was in the range of 55% to 60%. Hypokinesis of the basal-midinferolateral and inferior myocardium. Features are consistent with a pseudonormal left ventricular filling pattern, with concomitant abnormal  relaxation and increased filling pressure (grade 2 diastolic dysfunction). Doppler parameters are consistent with both elevated ventricular end-diastolic filling pressure and elevated left atrial filling pressure. - Aortic valve: There was trivial regurgitation. - Mitral valve: Consider papillar muscle dysfunction with wall motion abnormality. Normal-sized, mildly to moderately calcified annulus. Moderately thickened, mildly calcified leaflets posterior. Mobility of the posterior leaflet was mildly restricted. There was moderate to severe regurgitation directed posteriorly. - Left atrium: The atrium was moderately dilated. - Atrial septum: The septum bowed from left to right, consistent with increased left atrial pressure.   PHYSICAL EXAM Physical exam  Temp:  [97.4 F (36.3 C)-98.3 F (36.8 C)] 97.4 F (36.3 C) (09/28 2000) Pulse Rate:  [66-79] 73 (09/28 2300) Resp:  [8-31] 16 (09/28 2300) BP: (98-143)/(43-103) 122/49 mmHg (09/28 2300) SpO2:  [94 %-100 %] 96 % (09/28 2300) Weight:  [188 lb 4.4 oz (85.4 kg)] 188 lb 4.4 oz (85.4 kg) (09/28 0600)  General - Well nourished, well developed, in no apparent distress.  Ophthalmologic - not able to see through.  Cardiovascular - Regular rate and rhythm with no murmur.  Neuro - eyes open and tracking objects, awake, but lethargic, able to tell me her name, but not orientated to time, place or people, pt simply did not answer orientation questions.  PERRL, EOMI, facial symmetrical, tongue in middle, move all extremities equally and on commands, reflex 1+, bilaterally babinski negative. Significant b/l asterixis, consistent with metabolic encephalopathy.  ASSESSMENT/PLAN  Claudia [name] is a 80 y.o. female with history of DM, HTN, HLD, s/p CABG and MVR on 11/08/13 was consulted for AMS and mutism as well as breathing difficulty. She is on coumadin and INR so far therapeutic. The presentation is not typical for stroke especially no  focal neuro exam. EEG largely metabolic encephalopathy but did raise concern of NCSE, vimpat loaded and maintain does 50 mg bid.  Metabolic encephalopathy vs. Seizure vs. TIA    Pt condition more consistent with metabolic encephalopathy, contributing factors include b/l asterixis, renal failure, hyponatremia, and respiratory alkalosis.  Not sure why pt is on coumadin. Although EEG concerning for seizure, but largely it showed metabolic encephalopathy. But has loaded pt with 200mg  once and then 50mg  bid.     MRI  And MRA not able to perform due to recent cardiac procedure. Carotid Doppler  Bilateral: 1-39% ICA stenosis. Vertebral artery flow is antegrade.  2D Echo 55-60%  warfarin prior to admission, now on warfarin, therapeutic INR  Recommend to repeat CT head to look for any new infarct. Also repeat ABG, BMP.  LDL 45, no statin necessary as  meeting goal of LDL <100  HgbA1c 7.2, elevated.  Warfarin for VTE prophylaxis  NPO      Seizure - on vimpat - on LTM EEG - pt following commands and answer question during EEG recording, does not consistent with seizure.  Hypertension   Permissive hypertension <220/120 for 24-48 hours and then gradually normalize within 5-7 days  BP goal long term normotensive  Stable  Patient counseled to be compliant with her blood pressure medications  Hyperlipidemia  Home meds:  None. Will not start hospital.  LDL 45, goal < 100 (<70 for diabetics)  Continue statin at discharge  Diabetes  HgbA1c 7.2, Goal < 7.0  Uncontrolled  SSI   Educated patient about lifestyle changes for diabetes treatment prevention  Other Stroke Risk Factors - s/p CABG and MVR - acute renal failure  Hospital day # 83  Rosalin Hawking, MD PhD Stroke Neurology 12/03/2013 12:09 AM    To contact Stroke Continuity provider, please refer to [email or web address] After hours, contact General Neurology

## 2013-12-02 NOTE — Progress Notes (Signed)
I have seen and examined this patient and agree with the plan of care   [facility] 12/02/2013, 10:54 AM

## 2013-12-02 NOTE — Progress Notes (Signed)
ANTIBIOTIC CONSULT NOTE   Pharmacy Consult for Cefepime Indication: Serratia sepsis with MVR  Allergies  Allergen Reactions  . Aspirin Other (See Comments)    High doses caused stomach bleeds    Patient Measurements: Height: 5\' 7"  (170.2 cm) Weight: 188 lb 4.4 oz (85.4 kg) (this is a different bed) IBW/kg (Calculated) : 61.6  Labs:  Recent Labs  11/30/13 0430 12/01/13 0339 12/02/13 0416  WBC 7.6 7.8 6.9  HGB 11.0* 10.9* 10.8*  PLT 206 223 231  CREATININE 2.73* 2.83* 2.62*    Microbiology: Recent Results (from the past 720 hour(s))  MRSA PCR SCREENING     Status: None   Collection Time    11/05/13  1:28 PM      Result Value Ref Range Status   MRSA by PCR NEGATIVE  NEGATIVE Final   Comment:            The GeneXpert MRSA Assay (FDA     approved for NASAL specimens     only), is one component of a     comprehensive MRSA colonization     surveillance program. It is not     intended to diagnose MRSA     infection nor to guide or     monitor treatment for     MRSA infections.  SURGICAL PCR SCREEN     Status: None   Collection Time    11/06/13  8:13 AM      Result Value Ref Range Status   MRSA, PCR NEGATIVE  NEGATIVE Final   Staphylococcus aureus NEGATIVE  NEGATIVE Final   Comment:            The Xpert SA Assay (FDA     approved for NASAL specimens     in patients over 64 years of age),     is one component of     a comprehensive surveillance     program.  Test performance has     been validated by [employer] for patients greater     than or equal to 58 year old.     It is not intended     to diagnose infection nor to     guide or monitor treatment.  CULTURE, BLOOD (SINGLE)     Status: None   Collection Time    11/16/13  9:15 AM      Result Value Ref Range Status   Specimen Description BLOOD LEFT ARM   Final   Special Requests BOTTLES DRAWN AEROBIC ONLY 4CC   Final   Culture  Setup Time     Final   Value: 11/16/2013 15:20     Performed at [facility]   Culture     Final   Value: NO GROWTH 5 DAYS     Performed at [facility]   Report Status 11/22/2013 FINAL   Final  URINE CULTURE     Status: None   Collection Time    11/16/13  9:36 AM      Result Value Ref Range Status   Specimen Description URINE, CATHETERIZED   Final   Special Requests Normal   Final   Culture  Setup Time     Final   Value: 11/16/2013 19:34     Performed at [place]     Final   Value: NO GROWTH     Performed at [facility]  Final   Value: NO GROWTH     Performed at [facility]   Report Status 11/17/2013 FINAL   Final  CULTURE, RESPIRATORY (NON-EXPECTORATED)     Status: None   Collection Time    11/21/13  9:00 AM      Result Value Ref Range Status   Specimen Description TRACHEAL ASPIRATE   Final   Special Requests Normal   Final   Gram Stain     Final   Value: MODERATE WBC PRESENT,BOTH PMN AND MONONUCLEAR     RARE SQUAMOUS EPITHELIAL CELLS PRESENT     NO ORGANISMS SEEN     Performed at [facility]   Culture     Final   Value: FEW SERRATIA MARCESCENS     Performed at [facility]   Report Status 11/26/2013 FINAL   Final   Organism ID, Bacteria SERRATIA MARCESCENS   Final  CULTURE, BLOOD (ROUTINE X 2)     Status: None   Collection Time    11/21/13  9:35 AM      Result Value Ref Range Status   Specimen Description BLOOD RIGHT ARM   Final   Special Requests BOTTLES DRAWN AEROBIC ONLY 5CC   Final   Culture  Setup Time     Final   Value: 11/21/2013 14:44     Performed at [facility]   Culture     Final   Value: SERRATIA MARCESCENS     Note: Gram Stain Report Called to,Read Back By and Verified With: JESSIE SUTTER 11/23/13 AT 0445 [place]     Performed at [facility]   Report Status 11/26/2013 FINAL   Final   Organism ID, Bacteria SERRATIA MARCESCENS   Final  CULTURE, BLOOD (ROUTINE X 2)     Status: None   Collection Time    11/21/13  9:40  AM      Result Value Ref Range Status   Specimen Description BLOOD RIGHT FOREARM   Final   Special Requests BOTTLES DRAWN AEROBIC ONLY 5CC   Final   Culture  Setup Time     Final   Value: 11/21/2013 14:43     Performed at [facility]   Culture     Final   Value: SERRATIA MARCESCENS     Note: SUSCEPTIBILITIES PERFORMED ON PREVIOUS CULTURE WITHIN THE LAST 5 DAYS.     Note: Gram Stain Report Called to,Read Back By and Verified With: JESSIE SUTTER 11/23/13 AT 50 RIDK     Performed at [facility]   Report Status 11/25/2013 FINAL   Final  URINE CULTURE     Status: None   Collection Time    11/21/13 10:30 AM      Result Value Ref Range Status   Specimen Description URINE, CATHETERIZED   Final   Special Requests has completed 10 days of Ancef Normal   Final   Culture  Setup Time     Final   Value: 11/21/2013 17:18     Performed at [place]     Final   Value: 9,000 COLONIES/ML     Performed at [facility]   Culture     Final   Value: INSIGNIFICANT GROWTH     Performed at [facility]   Report Status 11/23/2013 FINAL   Final  CLOSTRIDIUM DIFFICILE BY PCR     Status: None   Collection Time    11/21/13  9:00 PM  Result Value Ref Range Status   C difficile by pcr NEGATIVE  NEGATIVE Final  BODY FLUID CULTURE     Status: None   Collection Time    11/22/13 11:02 AM      Result Value Ref Range Status   Specimen Description PLEURAL FLUID RIGHT   Final   Special Requests 60 ML FLUID   Final   Gram Stain     Final   Value: NO WBC SEEN     NO ORGANISMS SEEN     Performed at [facility]   Culture     Final   Value: NO GROWTH 3 DAYS     Performed at [facility]   Report Status 11/26/2013 FINAL   Final     Assessment: 80 year old female Pod #24 CABG with MVR Blood cultures and sputum culture positive for Serratia CrCl = 20 ml /min  Transitioned from Zosyn to Cefepime. ABX day # 12/14.  Cefepime day  #8. Planning 14 days of treatment - Day 12 of 14.  Creat 2.62. WBC 6.9, afebrile.    Goal of Therapy:  Appropriate dosing  Plan:  Cefepime 2gram IV Q 24 hours Anticipate DC abx 9/30 pm Eudelia Bunch, Pharm.D. [phone] 12/02/2013 12:08 PM

## 2013-12-02 NOTE — Progress Notes (Signed)
PT Cancellation Note  Patient Details Name: Claudia [name] MRN: [id number] DOB: 10/05/1931   Cancelled Treatment:    Reason Eval/Treat Not Completed: Medical issues which prohibited therapy.  Per nursing, pt being worked up for possible CVA.  Will check back tomorrow.  Thanks.    INGOLD,Madge Therrien 12/02/2013, 2:01 PM  [facility] [phone] (pager)

## 2013-12-02 NOTE — Progress Notes (Signed)
Overnight EEG started.

## 2013-12-02 NOTE — Progress Notes (Signed)
Dr Alexis Goodell returned called, was advised to call the stroke team to come do an evaluation.  Stroke team has been paged, waiting on response.

## 2013-12-02 NOTE — Procedures (Addendum)
CONTINUOUS VIDEO-EEG REPORT  Date of study: 09/29-30/2015  Patient's Nam: Claudia [name] MRN: [id number] Date of Birth: 02-Aug-1931  Referring Provider: Dr. Alexis Goodell  Clinical History:  This is a 80 year old woman with an episode of hyperventilation and mutism. Undergoing cEEG to guide medical management.  Technical Description:  The study consists of a 24-hour 16-channel multi-montage digital video EEG recording with 21 electrodes applied according to the International 10-20 system. Hyperventilation photic stimulation were not performed.  Electrographic Description:  The background when awake was characterized by diffuse slowing, consisting of low voltage polymorphic theta/delta, with frequent bursts of higher amplitude slowing, in the delta range, often with a poorly developed triphasic appearance. Compared to the prior 24 hrs, there was much less triphasic activity and more continuous low voltage theta. The posterior rhythm, though still slow, was somewhat faster reaching 6-7 Hz at times by comparison to yesterday's 5-6 Hz; and it was more continuous and less fragmented. There were abundant short runs of broadly distributed epileptiform discharges, some of which were generalized and triphasic-like. Many however, were fairly well lateralized, with phase-reversals maximal over either the left or the right parietal regions. These lateralized discharges often had a very brief evolution over 5 seconds or so, but none evolved longer than 10 seconds and would generally be considered interictal discharges. During sleep, the slowing was paradoxically less, with low amplitude but symmetric vertex waves and slow spindle-like activity consistent with a sedated or encephalopathic form of stage II sleep.  Impression: This is an abnormal 24 hr continuous video-EEG recording. Significant findings include: 1.  Diffuse slowing, low voltage polymorphic theta/delta, even when fully alert 2.  Generalized  triphasic activity, poorly formed and in short runs only 3.  Bilateral central/parietal sharp-wave complexes, independently over the left and right hemispheres, isolated and in short runs 4.  Slow fragmented posterior rhythm, 6-7 Hz  Clinical Correlation: The findings indicate a moderate diffuse encephalopathy, that appears to be improving. The findings do not specify and etiology, but would be consistent with a toxic/metabolic derangement. Also remaining are indications of independent bihemipsheric foci of cortical irritability with epileptogenic potential; no ongoing seizure was observed however. Findings discussed with the ICU Attending.  Erin Fulling, MD 12/04/2013 5:41 PM   CONTINUOUS VIDEO-EEG REPORT  Date of study: 09/28-29/2015  Patient's Nam: Claudia [name] MRN: [id number] Date of Birth: 03-06-1932  Referring Provider: Dr. Alexis Goodell  Clinical History:  This is a 80 year old woman with an episode of hyperventilation and mutism. Undergoing cEEG to guide medical management.  Technical Description:  The study consists of a 24-hour 16-channel multi-montage digital video EEG recording with 21 electrodes applied according to the International 10-20 system. Hyperventilation photic stimulation were not performed.  Electrographic Description:  Background when maximally alert is characterized by low voltage diffuse polymorphic theta and delta activity.  There is sometimes fragments of a 6-7 Hz posterior rhythm visible. Much of the recording, particularly in the first half during the day (while awake), consisted of generalized 2.5-3 Hz sharp slow wave complexes, often with a triphasic morphology, but with more variability than the usual triphasic pattern, and occasionally showing a subtle evolution concerning for nonconvulsive seizure activity. Later in the recording, while asleep, a continuous background with diffuse slowing was present without periodic discharges. Very low voltage  spindle activity was noted over the vertex during sleep, consistent with stage II sleep.  This pattern reverted to the triphasic like discharges when the patient was awakened.  Impression: This is an abnormal 24-hour video EEG recording.  Significant findings include: 1.  2-3 Hz triphasic activity nearly continuous while awake, sometimes with a questionable evolution suggesting nonconvulsive status epilepticus 2.  Moderate diffuse slowing, polymorphic theta and delta 3.  Fragmented slow posterior rhythm, 67 Hz  Clinical correlation: Triphasic activity is consistent with a moderate diffuse encephalopathy, consistent with the moderate diffuse slowing in the fragmented slow posterior rhythm; it further suggests a metabolic etiology.  As noted in the report below there some aspects of the recording that raise a concern for nonconvulsive status epilepticus.  However, this generalized triphasic activity tends to vary with state and disappears during sleep, which argues against nonconvulsive seizure. Further, after the prolonged periods of periodic discharges, there are no obvious focal regions of postictal slowing, as might be expected after prolonged focal seizure activity. Overall, most consistent with moderate diffuse encephalopathy.  Erin Fulling, MD 12/03/2013 3:51 PM     ELECTROENCEPHALOGRAM REPORT  Date of Study: 12/02/2013  Patient's Name: Claudia [name] MRN: [id number] Date of Birth: 1931-05-27  Referring Provider: Dr. Alexis Goodell  Clinical History: This is a 80 year old woman with an episode of hyperventilation and mutism.  Patient is now back to baseline and reports that she was feeling short of breath during the episode.   Medications: ceFEPime (MAXIPIME) IV   2 g  Intravenous  Q24H   citalopram   20 mg  Oral  Daily   darbepoetin (ARANESP) injection - NON-DIALYSIS   100 mcg  Subcutaneous  Q Wed-1800   furosemide   160 mg  Oral  BID   metoprolol tartrate   12.5 mg  Oral   BID   pantoprazole   40 mg  Oral  Daily   warfarin   2 mg  Oral  q1800    Technical Summary: A multichannel digital EEG recording measured by the international 10-20 system with electrodes applied with paste and impedances below 5000 ohms performed as portable with EKG monitoring in an awake patient.  Hyperventilation and photic stimulation were not performed.  The digital EEG was referentially recorded, reformatted, and digitally filtered in a variety of bipolar and referential montages for optimal display.   Description: The patient is awake during the recording. She is noted to intermittently answer questions correctly.  During maximal wakefulness, there is no clear posterior dominant rhythm. The background is disorganized with a large amount of diffuse 4-5 Hz theta and 2-3 Hz delta slowing noted.  There are periodic generalized discharges at a frequency of 2-3 per second, at times with a triphasic appearance, some with phase reversal over T6, P3, P4. There is no clear evolution seen, however these discharges wax and wane, intermixed with 10-40 seconds of diffuse slowing without sharp wave activity, concerning for electrographic seizures.  Normal sleep architecture is not seen.  EKG lead was unremarkable.  Impression: This awake EEG is severely abnormal due to the presence of: 1. Disorganized moderate to severe diffuse slowing of the waking background 2. Periodic generalized discharges that wax and wane in frequency, triphasic appearance predominantly, some with phase reversal over T6, P3, and P4.  Clinical Correlation: Although the periodic generalized discharges noted above have a triphasic appearance which can be seen with toxic/metabolic encephalopathies, the waxing and waning nature is concerning for nonconvulsive status epilepticus. There is possible epileptogenic potential over the posterior temporal and parietal regions.  Moderate to severe diffuse slowing of the background indicates  diffuse cerebral dysfunction that is non-specific  in etiology.  Findings were relayed to Dr. Doy Mince on 12/02/13 at 1:17pm.   Ellouise Newer, M.D.

## 2013-12-02 NOTE — Progress Notes (Signed)
Subjective:   Patient seen at bedside this AM, resting comfortably. No acute complaints, no overnight issues. Says she slept well, good appetite, no abdominal pain, nausea, or vomiting. Per nursing, patient is refusing to eat and has very little motivation to ambulate. Incontinent of urine. Had episode of tachypnea and confusion yesterday, evaluated by neurology.  CT head unremarkable for acute infarct.  Weight 188 lb 4.4 oz this AM.  1L UO over past 24 hours (but has had urinary incontinence). Cr. 2.62.  Objective Vital signs in last 24 hours: Filed Vitals:   12/02/13 0100 12/02/13 0200 12/02/13 0300 12/02/13 0400  BP: 118/46 122/46 120/94 119/47  Pulse: 69 69 71 71  Temp:    97.9 F (36.6 C)  TempSrc:    Oral  Resp: 21 16 8 14   Height:      Weight:      SpO2: 99% 98% 100% 98%   Weight change:   Intake/Output Summary (Last 24 hours) at 12/02/13 0647 Last data filed at 12/02/13 0400  Gross per 24 hour  Intake  513.3 ml  Output   1000 ml  Net -486.7 ml   Physical Exam:  General: Obese female, alert, cooperative, NAD.  HEENT: PERRL, EOMI. Moist mucus membranes Neck: Full range of motion without pain, supple, no lymphadenopathy or carotid bruits Lungs: Clear to ascultation bilaterally, normal work of respiration, no wheezes, rales, rhonchi.  Heart: RRR, no murmurs, gallops, or rubs Abdomen: Soft, non-tender, non-distended, BS +  Extremities: +1 pitting edema. Neurologic: Alert & oriented X3, cranial nerves II-XII intact, strength grossly intact, sensation intact to light touch   Labs: Basic Metabolic Panel:  Recent Labs Lab 11/30/13 0430 12/01/13 0339 12/02/13 0416  NA 125* 127* 129*  K 4.0 4.0 3.5*  CL 78* 82* 85*  CO2 33* 31 30  GLUCOSE 120* 120* 152*  BUN 55* 52* 46*  CREATININE 2.73* 2.83* 2.62*  CALCIUM 8.8 8.8 8.6     Recent Labs Lab 11/28/13 0500 11/29/13 0415 11/30/13 0430 12/01/13 0339 12/02/13 0416  WBC 7.8 7.4 7.6 7.8 6.9  HGB 11.0* 11.4*  11.0* 10.9* 10.8*  HCT 33.0* 34.6* 33.8* 33.5* 33.6*  MCV 85.7 86.1 88.5 86.3 86.4  PLT 175 203 206 223 231    Recent Labs Lab 12/01/13 0626 12/01/13 1118 12/01/13 1618 12/01/13 1925 12/01/13 2218  GLUCAP 141* 166* 193* 231* 150*    Medications: Infusions: . sodium chloride 10 mL/hr at 11/29/13 0700  . sodium chloride 250 mL (11/21/13 1541)  . DOPamine 2.5 mcg/kg/min (12/01/13 2000)    Scheduled Medications: . amiodarone  200 mg Oral BID  . ceFEPime (MAXIPIME) IV  2 g Intravenous Q24H  . citalopram  20 mg Oral Daily  . darbepoetin (ARANESP) injection - NON-DIALYSIS  100 mcg Subcutaneous Q Wed-1800  . feeding supplement (GLUCERNA SHAKE)  237 mL Oral Q1500  . furosemide  160 mg Oral BID  . insulin aspart  0-24 Units Subcutaneous TID AC & HS  . magic mouthwash  5 mL Oral TID  . metoprolol tartrate  12.5 mg Oral BID   Or  . metoprolol tartrate  12.5 mg Per Tube BID  . neomycin-bacitracin-polymyxin   Topical Daily  . nystatin cream   Topical TID  . pantoprazole  40 mg Oral Daily  . sodium chloride  10-40 mL Intracatheter Q12H  . sodium chloride  3 mL Intravenous Q12H  . warfarin  2 mg Oral q1800  . Warfarin - Physician Dosing Inpatient   Does  not apply q72    Background 80 year old female with multiple medical issues including stage III CKD who is status post CABG and mitral valve replacement with postoperative acute on chronic renal failure (baseline creatinine around 1.5), marked volume overload (50 lb +; improved and near preoperative weight)  Assessment/Recommendations  Renal- GFR largely stable.  On Lasix 160 mg po bid, weight ~188 lbs this AM. Cr 2.62. No change in diuresis today, can likely decrease tomorrow.  AMS, tachypnea- Patient w/ confusion and dyspnea yesterday, CT head largely negative, neurology consulted. Plan for EEG today. Consider psych evaluation. Left Pleural Effusion- Chest tube placed 9/24, removed 9/26. CT surgery following.   Hypertension/volume- Improved, likely at baseline at this time. Continue diuretic program as above. Follow daily weights, UOP. Serratia Bacteremia- Tracheal aspirate and blood cultures 2/2 positive for serratia marcescens. Transitioned from Zosyn to Cefepime on 11/25/13.    Anemia- Situational and CKD- continue weekly aranesp. Iron studies from 9/17 w/ iron of 19, ferritin of 604, Tsat of 7%.   No IV Fe given serratia infection Hypokalemia-  3.5 this AM. Given 40 mEq po Hyponatremia- Stable, limit free water intake.   S/p CABG/MVR AFib on heparin, BB, amio po- In NSR, on po amio    Signed: Luanne Bras, MD 12/02/2013 6:47 AM

## 2013-12-02 NOTE — Progress Notes (Signed)
Patient ID: Claudia [name], female   DOB: [birth year], 80 y.o.   MRN: [id number] EVENING ROUNDS NOTE :     [address]       [place] [address]             [phone]                 24 Days Post-Op Procedure(s) (LRB): CORONARY ARTERY BYPASS GRAFTING (CABG), on pump, times two, using left internal mammary artery, cryo saphenous vein. (N/A) INTRAOPERATIVE TRANSESOPHAGEAL ECHOCARDIOGRAM (N/A) MITRAL VALVE (MV) REPLACEMENT (N/A)  Total Length of Stay:  LOS: 27 days  BP 119/50  Pulse 68  Temp(Src) 98.3 F (36.8 C) (Oral)  Resp 16  Ht 5\' 7"  (1.702 m)  Wt 188 lb 4.4 oz (85.4 kg)  BMI 29.48 kg/m2  SpO2 97%  .Intake/Output     09/27 0701 - 09/28 0700 09/28 0701 - 09/29 0700   P.O. 180 120   I.V. (mL/kg) 385.6 (4.5) 144 (1.7)   IV Piggyback 50 95   Total Intake(mL/kg) 615.6 (7.2) 359 (4.2)   Urine (mL/kg/hr) 1010 (0.5) 450 (0.5)   Total Output 1010 450   Net -394.4 -91        Urine Occurrence 9 x 2 x     . sodium chloride 10 mL/hr at 11/29/13 0700  . sodium chloride 250 mL (11/21/13 1541)  . DOPamine 2.5 mcg/kg/min (12/01/13 2000)     Lab Results  Component Value Date   WBC 6.9 12/02/2013   HGB 10.8* 12/02/2013   HCT 33.6* 12/02/2013   PLT 231 12/02/2013   GLUCOSE 152* 12/02/2013   CHOL 125 12/03/2012   TRIG 42 12/03/2012   HDL 72 12/03/2012   LDLCALC 45 12/03/2012   ALT 31 11/21/2013   AST 26 11/21/2013   NA 129* 12/02/2013   K 3.5* 12/02/2013   CL 85* 12/02/2013   CREATININE 2.62* 12/02/2013   BUN 46* 12/02/2013   CO2 30 12/02/2013   TSH 0.596 12/02/2012   INR 2.09* 12/02/2013   HGBA1C 7.2* 11/06/2013   Has had change in neuro status, getting eeg now  Neurology is evaluation  Grace Isaac MD  Beeper [phone] Office [phone] 12/02/2013 5:49 PM

## 2013-12-02 NOTE — Progress Notes (Addendum)
      [address]       [place] [address]             [phone]      24 Days Post-Op Procedure(s) (LRB): CORONARY ARTERY BYPASS GRAFTING (CABG), on pump, times two, using left internal mammary artery, cryo saphenous vein. (N/A) INTRAOPERATIVE TRANSESOPHAGEAL ECHOCARDIOGRAM (N/A) MITRAL VALVE (MV) REPLACEMENT (N/A)  Subjective:  Ms. Petronio has no new complaints this morning.  She is neurologically intact.  Patient has been incontinent of urine and has already soaked through mattress.  Per nursing skin breakdown present with yeast issues wound care has evaluated patient.  Objective: Vital signs in last 24 hours: Temp:  [97.4 F (36.3 C)-98.2 F (36.8 C)] 97.9 F (36.6 C) (09/28 0400) Pulse Rate:  [69-82] 72 (09/28 0700) Cardiac Rhythm:  [-] Normal sinus rhythm (09/28 0600) Resp:  [8-31] 11 (09/28 0700) BP: (107-144)/(43-106) 136/57 mmHg (09/28 0700) SpO2:  [98 %-100 %] 100 % (09/28 0700) Weight:  [188 lb 4.4 oz (85.4 kg)] 188 lb 4.4 oz (85.4 kg) (09/28 0600)  Intake/Output from previous day: 09/27 0701 - 09/28 0700 In: 615.6 [P.O.:180; I.V.:385.6; IV Piggyback:50] Out: 1010 [Urine:1010]  General appearance: alert, cooperative and no distress Heart: regular rate and rhythm Lungs: diminished breath sounds bibasilar Abdomen: soft, non-tender; bowel sounds normal; no masses,  no organomegaly Extremities: edema improved, 1+ Wound: clean and dry  Lab Results:  Recent Labs  12/01/13 0339 12/02/13 0416  WBC 7.8 6.9  HGB 10.9* 10.8*  HCT 33.5* 33.6*  PLT 223 231   BMET:  Recent Labs  12/01/13 0339 12/02/13 0416  NA 127* 129*  K 4.0 3.5*  CL 82* 85*  CO2 31 30  GLUCOSE 120* 152*  BUN 52* 46*  CREATININE 2.83* 2.62*  CALCIUM 8.8 8.6    PT/INR:  Recent Labs  12/02/13 0416  LABPROT 23.5*  INR 2.09*   ABG    Component Value Date/Time   PHART 7.550* 12/01/2013 1302   HCO3 28.1* 12/01/2013 1302   TCO2 29.1 12/01/2013 1302   ACIDBASEDEF  7.0* 11/11/2013 1642   O2SAT 98.0 12/01/2013 1302   CBG (last 3)   Recent Labs  12/01/13 1618 12/01/13 1925 12/01/13 2218  GLUCAP 193* 231* 150*    Assessment/Plan: S/P Procedure(s) (LRB): CORONARY ARTERY BYPASS GRAFTING (CABG), on pump, times two, using left internal mammary artery, cryo saphenous vein. (N/A) INTRAOPERATIVE TRANSESOPHAGEAL ECHOCARDIOGRAM (N/A) MITRAL VALVE (MV) REPLACEMENT (N/A)  1. CV- hemodynamically stable, maintaining NSR- weaning Dopamine as tolerated, continue Lopressor, Amiodarone 2. Pulm- wean oxygen as tolerated, CXR with continued bibasilar atelectasis 3. Renal- creatinine increased today, good U/O continue diuresis, Nephrology 4. GU- + urinary incontinence, patient has soaked 2 mattresses, UA sent and is negative, however may benefit from foley being replaced to prevent further skin breakdown 5. DM- CBGs remain controlled 6. Deconditioning- patient will need SNF at discharge, however she needs a lot of encouragement to get out of bed and ambulate 7. Dispo- patient with episode of aphasia, confusion over the weekend was transferred back to ICU, patient per nursing is now refusing to eat, not willing to ambulate or do much of anything, they question need for possible Psych consult, continue current care   LOS: 27 days    BARRETT, ERIN 12/02/2013  Patient in bed with EEG testing- normal speech and orientation Replace foley

## 2013-12-02 NOTE — Progress Notes (Signed)
Pt is showing increased signs of cognitive changes such as: unable to answer questions about time, location, situation; unable to recognize or name family members; Blank stares; saying random inappropriate statements (possible expressive aphasia); however is somewhat following commands squeezing hands, lifting legs, wiggling toes equally bilaterally.

## 2013-12-03 ENCOUNTER — Inpatient Hospital Stay (HOSPITAL_COMMUNITY): Payer: Medicare HMO

## 2013-12-03 DIAGNOSIS — G9341 Metabolic encephalopathy: Secondary | ICD-10-CM

## 2013-12-03 LAB — URINE CULTURE: Colony Count: 30000

## 2013-12-03 LAB — COMPREHENSIVE METABOLIC PANEL
ALT: 10 U/L (ref 0–35)
AST: 12 U/L (ref 0–37)
Albumin: 2.5 g/dL — ABNORMAL LOW (ref 3.5–5.2)
Alkaline Phosphatase: 69 U/L (ref 39–117)
Anion gap: 12 (ref 5–15)
BUN: 44 mg/dL — ABNORMAL HIGH (ref 6–23)
CO2: 30 mEq/L (ref 19–32)
Calcium: 8.7 mg/dL (ref 8.4–10.5)
Chloride: 88 mEq/L — ABNORMAL LOW (ref 96–112)
Creatinine, Ser: 2.64 mg/dL — ABNORMAL HIGH (ref 0.50–1.10)
GFR calc Af Amer: 19 mL/min — ABNORMAL LOW (ref >90)
GFR calc non Af Amer: 16 mL/min — ABNORMAL LOW (ref >90)
Glucose, Bld: 134 mg/dL — ABNORMAL HIGH (ref 70–99)
Potassium: 3.9 mEq/L (ref 3.7–5.3)
Sodium: 130 mEq/L — ABNORMAL LOW (ref 137–147)
Total Bilirubin: 0.4 mg/dL (ref 0.3–1.2)
Total Protein: 6.3 g/dL (ref 6.0–8.3)

## 2013-12-03 LAB — CBC
HCT: 33.8 % — ABNORMAL LOW (ref 36.0–46.0)
Hemoglobin: 10.8 g/dL — ABNORMAL LOW (ref 12.0–15.0)
MCH: 27.9 pg (ref 26.0–34.0)
MCHC: 32 g/dL (ref 30.0–36.0)
MCV: 87.3 fL (ref 78.0–100.0)
Platelets: 218 10*3/uL (ref 150–400)
RBC: 3.87 MIL/uL (ref 3.87–5.11)
RDW: 15.8 % — ABNORMAL HIGH (ref 11.5–15.5)
WBC: 6.9 10*3/uL (ref 4.0–10.5)

## 2013-12-03 LAB — GLUCOSE, CAPILLARY
Glucose-Capillary: 102 mg/dL — ABNORMAL HIGH (ref 70–99)
Glucose-Capillary: 139 mg/dL — ABNORMAL HIGH (ref 70–99)
Glucose-Capillary: 141 mg/dL — ABNORMAL HIGH (ref 70–99)
Glucose-Capillary: 183 mg/dL — ABNORMAL HIGH (ref 70–99)

## 2013-12-03 LAB — PROTIME-INR
INR: 2.25 — ABNORMAL HIGH (ref 0.00–1.49)
Prothrombin Time: 24.9 seconds — ABNORMAL HIGH (ref 11.6–15.2)

## 2013-12-03 MED ORDER — LORAZEPAM 2 MG/ML IJ SOLN
1.0000 mg | Freq: Once | INTRAMUSCULAR | Status: AC
  Administered 2013-12-03: 1 mg via INTRAVENOUS

## 2013-12-03 MED ORDER — FUROSEMIDE 80 MG PO TABS
80.0000 mg | ORAL_TABLET | Freq: Two times a day (BID) | ORAL | Status: DC
  Administered 2013-12-03 – 2013-12-06 (×6): 80 mg via ORAL
  Filled 2013-12-03 (×3): qty 1
  Filled 2013-12-03: qty 2
  Filled 2013-12-03 (×2): qty 1
  Filled 2013-12-03: qty 2
  Filled 2013-12-03 (×4): qty 1

## 2013-12-03 MED ORDER — SODIUM CHLORIDE 0.9 % IV SOLN
150.0000 mg | INTRAVENOUS | Status: AC
  Administered 2013-12-03: 150 mg via INTRAVENOUS
  Filled 2013-12-03: qty 15

## 2013-12-03 MED ORDER — GLUCERNA SHAKE PO LIQD
237.0000 mL | Freq: Three times a day (TID) | ORAL | Status: DC
  Administered 2013-12-03 – 2013-12-13 (×23): 237 mL via ORAL
  Filled 2013-12-03 (×4): qty 237

## 2013-12-03 MED ORDER — LORAZEPAM 2 MG/ML IJ SOLN
INTRAMUSCULAR | Status: AC
  Filled 2013-12-03: qty 1

## 2013-12-03 MED ORDER — AMIODARONE HCL 200 MG PO TABS
200.0000 mg | ORAL_TABLET | Freq: Every day | ORAL | Status: DC
  Administered 2013-12-04: 200 mg via ORAL
  Filled 2013-12-03 (×3): qty 1

## 2013-12-03 MED ORDER — LACOSAMIDE 200 MG/20ML IV SOLN
100.0000 mg | Freq: Two times a day (BID) | INTRAVENOUS | Status: DC
  Administered 2013-12-03 – 2013-12-06 (×8): 100 mg via INTRAVENOUS
  Filled 2013-12-03 (×18): qty 10

## 2013-12-03 NOTE — Progress Notes (Signed)
Subjective:   Patient seen at bedside this AM. Unresponsive initially, then became responsive w/ no significant complaints. Change in waveform on EEG during this time. A&O x3.  Abnormal EEG yesterday concerning for seizure activity, started on Vimpat.  Cr 2.64 today, generally unchanged.  ~1.2L urine output over 24 hours.  Weight 187 lb 2.7 oz.   Objective Vital signs in last 24 hours: Filed Vitals:   12/03/13 0400 12/03/13 0500 12/03/13 0600 12/03/13 0700  BP: 121/48 118/49 97/39 126/58  Pulse: 67 69 65 66  Temp: 97.5 F (36.4 C)     TempSrc: Oral     Resp: 16 16 17 14   Height:      Weight: 187 lb 2.7 oz (84.9 kg)     SpO2: 97% 96% 97% 98%   Weight change: -1 lb 1.6 oz (-0.5 kg)  Intake/Output Summary (Last 24 hours) at 12/03/13 5009 Last data filed at 12/03/13 0700  Gross per 24 hour  Intake  978.2 ml  Output   1180 ml  Net -201.8 ml   Physical Exam:  General: Obese female, alert, cooperative, NAD. Unresponsive on initial evaluation, but regained consciousness, able to answer questions appropriately.  HEENT: PERRL, EOMI. Moist mucus membranes Neck: Full range of motion without pain, supple, no lymphadenopathy or carotid bruits. RIJ CVL.  Lungs: Clear to ascultation bilaterally, normal work of respiration, no wheezes, rales, rhonchi.  Heart: RRR, no murmurs, gallops, or rubs Abdomen: Soft, non-tender, non-distended, BS +  Extremities: +1 pitting edema.  Neurologic: Not responsive initially, then became alert & oriented x3, cranial nerves II-XII intact, strength grossly intact, sensation intact to light touch   Labs: Basic Metabolic Panel:  Recent Labs Lab 12/01/13 0339 12/02/13 0416 12/03/13 0337  NA 127* 129* 130*  K 4.0 3.5* 3.9  CL 82* 85* 88*  CO2 31 30 30   GLUCOSE 120* 152* 134*  BUN 52* 46* 44*  CREATININE 2.83* 2.62* 2.64*  CALCIUM 8.8 8.6 8.7     Recent Labs Lab 11/29/13 0415 11/30/13 0430 12/01/13 0339 12/02/13 0416 12/03/13 0337  WBC 7.4  7.6 7.8 6.9 6.9  HGB 11.4* 11.0* 10.9* 10.8* 10.8*  HCT 34.6* 33.8* 33.5* 33.6* 33.8*  MCV 86.1 88.5 86.3 86.4 87.3  PLT 203 206 223 231 218    Recent Labs Lab 12/02/13 1233 12/02/13 1648 12/02/13 2140 12/02/13 2343 12/03/13 0312  GLUCAP 218* 136* 172* 183* 139*    Medications: Infusions: . sodium chloride 10 mL/hr at 11/29/13 0700  . sodium chloride 250 mL (11/21/13 1541)  . sodium chloride 50 mL/hr at 12/02/13 1814  . DOPamine 2.5 mcg/kg/min (12/02/13 1817)    Scheduled Medications: . amiodarone  200 mg Oral BID  . ceFEPime (MAXIPIME) IV  2 g Intravenous Q24H  . citalopram  20 mg Oral Daily  . darbepoetin (ARANESP) injection - NON-DIALYSIS  100 mcg Subcutaneous Q Wed-1800  . feeding supplement (GLUCERNA SHAKE)  237 mL Oral Q1500  . furosemide  160 mg Oral BID  . insulin aspart  0-24 Units Subcutaneous 6 times per day  . lacosamide (VIMPAT) IV  100 mg Intravenous Q12H  . magic mouthwash  5 mL Oral TID  . neomycin-bacitracin-polymyxin   Topical Daily  . nystatin cream   Topical TID  . pantoprazole  40 mg Oral Daily  . sodium chloride  10-40 mL Intracatheter Q12H  . sodium chloride  3 mL Intravenous Q12H  . warfarin  2 mg Oral q1800  . Warfarin - Physician Dosing Inpatient  Does not apply q42    Background 80 year old female with multiple medical issues including stage III CKD who is status post CABG and mitral valve replacement with postoperative acute on chronic renal failure (baseline creatinine around 1.5), marked volume overload (50 lb +; improved and near preoperative weight)  Assessment/Recommendations  Renal- GFR largely stable. On Lasix 160 mg po bid, weight ~187 lbs this AM. Cr 2.64. Will decrease Lasix 80 mg po bid today given stable Cr. Acute Encephalopathy- Patient w/ recent change in mental status. CT head on 9/27 negative. Some concern for metabolic encephalopathy vs seizure activity. EEG performed yesterday showed activity suspicious of seizure,  started on Vimpat.  Left Pleural Effusion- Chest tube placed 9/24, removed 9/26. CT surgery following.  Hypertension/volume- Improved, likely at baseline at this time. Continue diuretic program as above. Follow daily weights, UOP. Serratia Bacteremia- Tracheal aspirate and blood cultures 2/2 positive for serratia marcescens. Transitioned from Zosyn to Cefepime on 11/25/13.    Anemia- Situational and CKD- continue weekly aranesp. Iron studies from 9/17 w/ iron of 19, ferritin of 604, Tsat of 7%.   No IV Fe given serratia infection Hypokalemia-  3.9 this AM. Continue to monitor.  Hyponatremia- Stable 130 this AM, limit free water intake.   S/p CABG/MVR AFib on heparin, BB, amio po- In NSR, on po amio    Signed: Luanne Bras, MD 12/03/2013 7:02 AM

## 2013-12-03 NOTE — Clinical Social Work Note (Signed)
CSW continues to follow for ?SNF when/if appropriate- noted patient transferred back to 2S (code stroke) and at this time remains in ICU. CSW will follow for support and dc planning/disposition-  Eduard Clos, MSW, [place]

## 2013-12-03 NOTE — Progress Notes (Signed)
PT Cancellation Note  Patient Details Name: Claudia [name] MRN: [id number] DOB: 12-09-1931   Cancelled Treatment:    Reason Eval/Treat Not Completed: Patient at procedure or test/unavailable.  Pt on continuous EEG.  Per RN should be off this PM and MD wants pt OOB.  PT to check back later this PM.   Thanks,    Wells Guiles B. [place], [place], DPT [phone]   12/03/2013, 11:14 AM

## 2013-12-03 NOTE — Progress Notes (Signed)
25 Days Post-Op Procedure(s) (LRB): CORONARY ARTERY BYPASS GRAFTING (CABG), on pump, times two, using left internal mammary artery, cryo saphenous vein. (N/A) INTRAOPERATIVE TRANSESOPHAGEAL ECHOCARDIOGRAM (N/A) MITRAL VALVE (MV) REPLACEMENT (N/A) Subjective: Back in ICU with transient absence spells which appear to be seizures- neuro treating Maintaining nsr INR in good range CXR w/o reaccumulation of L pleural effusion Pending swallow eval due to AMS Completing 2 weeks of IV Maxepime for gram neg pneumonia,bacteremia ( serratia) Objective: Vital signs in last 24 hours: Temp:  [97.4 F (36.3 C)-98.3 F (36.8 C)] 97.5 F (36.4 C) (09/29 0400) Pulse Rate:  [65-79] 66 (09/29 0700) Cardiac Rhythm:  [-] Normal sinus rhythm (09/29 0700) Resp:  [11-27] 14 (09/29 0700) BP: (97-130)/(39-103) 126/58 mmHg (09/29 0700) SpO2:  [94 %-98 %] 98 % (09/29 0700) Weight:  [187 lb 2.7 oz (84.9 kg)] 187 lb 2.7 oz (84.9 kg) (09/29 0400)  Hemodynamic parameters for last 24 hours:  stable  Intake/Output from previous day: 09/28 0701 - 09/29 0700 In: 978.2 [P.O.:120; I.V.:693.2; IV Piggyback:165] Out: 1180 [Urine:1180] Intake/Output this shift:    Pt more alert today, vocalizing Lungs clear extrem warm  Lab Results:  Recent Labs  12/02/13 0416 12/03/13 0337  WBC 6.9 6.9  HGB 10.8* 10.8*  HCT 33.6* 33.8*  PLT 231 218   BMET:  Recent Labs  12/02/13 0416 12/03/13 0337  NA 129* 130*  K 3.5* 3.9  CL 85* 88*  CO2 30 30  GLUCOSE 152* 134*  BUN 46* 44*  CREATININE 2.62* 2.64*  CALCIUM 8.6 8.7    PT/INR:  Recent Labs  12/03/13 0337  LABPROT 24.9*  INR 2.25*   ABG    Component Value Date/Time   PHART 7.550* 12/01/2013 1302   HCO3 28.1* 12/01/2013 1302   TCO2 29.1 12/01/2013 1302   ACIDBASEDEF 7.0* 11/11/2013 1642   O2SAT 98.0 12/01/2013 1302   CBG (last 3)   Recent Labs  12/02/13 2140 12/02/13 2343 12/03/13 0312  GLUCAP 172* 183* 139*    Assessment/Plan: S/P  Procedure(s) (LRB): CORONARY ARTERY BYPASS GRAFTING (CABG), on pump, times two, using left internal mammary artery, cryo saphenous vein. (N/A) INTRAOPERATIVE TRANSESOPHAGEAL ECHOCARDIOGRAM (N/A) MITRAL VALVE (MV) REPLACEMENT (N/A) Cont IV NS until po intake better Keep in ICU due ti neuro changes   LOS: 28 days    VAN TRIGT III,Allona Gondek 12/03/2013

## 2013-12-03 NOTE — Progress Notes (Signed)
STROKE TEAM PROGRESS NOTE   HISTORY Claudia [name] is an 80 y.o. 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 Has had a complicated recovery. Today when family entered the room the patient was breathing fast and loud. Patient was also unable to recognize family members. Nursing was contacted at that time and code stroke was called.  Patient is on Coumadin with an INR of 2.16.  Date last known well: Date: 12/01/2013  Time last known well: Time: 09:30  tPA Given: No: On Coumadin with an elevated INR   SUBJECTIVE (INTERVAL HISTORY) Her RN is at the bedside.  Overall she feels her condition is stable. However, RN does noticed that her mental status wax and wane, one time she is answering all questions and follows commands without problem but another time, she stares and not answering and did not follow commands. Overnight, she had subtle face and left arm/shoulder twitching. EEG showed periodic generalized discharges with intermixed high amplitude theta-delta that at times becomes rhythmic. Given 1mg  ativan and her EEG better. Increased vimpat to 100mg  bid after second load of 200mg . This morning she is doing well with appropriate answers and following commands.   OBJECTIVE Temp:  [96.9 F (36.1 C)-98.2 F (36.8 C)] 98 F (36.7 C) (09/29 1500) Pulse Rate:  [65-77] 76 (09/29 1545) Cardiac Rhythm:  [-] Normal sinus rhythm (09/29 0700) Resp:  [12-27] 18 (09/29 1545) BP: (78-140)/(37-103) 124/48 mmHg (09/29 1545) SpO2:  [96 %-100 %] 98 % (09/29 1545) Weight:  [187 lb 2.7 oz (84.9 kg)] 187 lb 2.7 oz (84.9 kg) (09/29 0400)   Recent Labs Lab 12/02/13 2140 12/02/13 2343 12/03/13 0312 12/03/13 0752 12/03/13 1126  GLUCAP 172* 183* 139* 102* 141*    Recent Labs Lab 11/29/13 0415 11/30/13 0430 12/01/13 0339 12/02/13 0416 12/03/13 0337  NA 128* 125* 127* 129* 130*  K 4.4 4.0 4.0 3.5* 3.9  CL 81* 78* 82* 85* 88*  CO2 34* 33* 31 30 30   GLUCOSE 97 120* 120* 152* 134*  BUN 59* 55* 52*  46* 44*  CREATININE 2.63* 2.73* 2.83* 2.62* 2.64*  CALCIUM 8.6 8.8 8.8 8.6 8.7    Recent Labs Lab 12/03/13 0337  AST 12  ALT 10  ALKPHOS 69  BILITOT 0.4  PROT 6.3  ALBUMIN 2.5*    Recent Labs Lab 11/29/13 0415 11/30/13 0430 12/01/13 0339 12/02/13 0416 12/03/13 0337  WBC 7.4 7.6 7.8 6.9 6.9  HGB 11.4* 11.0* 10.9* 10.8* 10.8*  HCT 34.6* 33.8* 33.5* 33.6* 33.8*  MCV 86.1 88.5 86.3 86.4 87.3  PLT 203 206 223 231 218   No results found for this basename: CKTOTAL, CKMB, CKMBINDEX, TROPONINI,  in the last 168 hours  Recent Labs  12/01/13 0339 12/02/13 0416 12/03/13 0337  LABPROT 24.1* 23.5* 24.9*  INR 2.16* 2.09* 2.25*    Recent Labs  12/01/13 1126  COLORURINE YELLOW  LABSPEC 1.008  PHURINE 7.5  GLUCOSEU 100*  HGBUR SMALL*  BILIRUBINUR NEGATIVE  KETONESUR NEGATIVE  PROTEINUR 30*  UROBILINOGEN 0.2  NITRITE NEGATIVE  LEUKOCYTESUR SMALL*       Component Value Date/Time   CHOL 125 12/03/2012 0355   TRIG 42 12/03/2012 0355   HDL 72 12/03/2012 0355   CHOLHDL 1.7 12/03/2012 0355   VLDL 8 12/03/2012 0355   LDLCALC 45 12/03/2012 0355   Lab Results  Component Value Date   HGBA1C 7.2* 11/06/2013   No results found for this basename: labopia,  cocainscrnur,  labbenz,  amphetmu,  thcu,  labbarb  No results found for this basename: ETH,  in the last 168 hours  Ct Head Wo Contrast  12/01/2013   IMPRESSION: 1. Atrophy and small vessel disease. 2.  No evidence for acute intracranial abnormality.      Dg Chest Port 1 View  12/02/2013    IMPRESSION: Stable appearance of the chest when compare with the prior exam.      12/01/2013   IMPRESSION: Enlargement of cardiac silhouette post MVR.  Bibasilar atelectasis with additional atelectasis or scarring in RIGHT upper lobe.     CUS - 11/05/13 - Bilateral: 1-39% ICA stenosis. Vertebral artery flow is antegrade.  2D echo - 11/05/13 - - Left ventricle: The cavity size was normal. There was mild concentric hypertrophy. Systolic  function was normal. The estimated ejection fraction was in the range of 55% to 60%. Hypokinesis of the basal-midinferolateral and inferior myocardium. Features are consistent with a pseudonormal left ventricular filling pattern, with concomitant abnormal relaxation and increased filling pressure (grade 2 diastolic dysfunction). Doppler parameters are consistent with both elevated ventricular end-diastolic filling pressure and elevated left atrial filling pressure. - Aortic valve: There was trivial regurgitation. - Mitral valve: Consider papillar muscle dysfunction with wall motion abnormality. Normal-sized, mildly to moderately calcified annulus. Moderately thickened, mildly calcified leaflets posterior. Mobility of the posterior leaflet was mildly restricted. There was moderate to severe regurgitation directed posteriorly. - Left atrium: The atrium was moderately dilated. - Atrial septum: The septum bowed from left to right, consistent with increased left atrial pressure.  LTM EEG 8/28-8/29: Triphasic activity is consistent with a moderate diffuse encephalopathy, consistent with the moderate diffuse slowing in the fragmented slow posterior rhythm; it further suggests a metabolic etiology. As noted in the report below there some aspects of the recording that raise a concern for nonconvulsive status epilepticus. However, this generalized triphasic activity tends to vary with state and disappears during sleep, which argues against nonconvulsive seizure. Further, after the prolonged periods of periodic discharges, there are no obvious focal regions of postictal slowing, as might be expected after prolonged focal seizure activity. Overall, most consistent with moderate diffuse encephalopathy.    PHYSICAL EXAM  Temp:  [96.9 F (36.1 C)-98.2 F (36.8 C)] 98 F (36.7 C) (09/29 1500) Pulse Rate:  [65-77] 76 (09/29 1545) Resp:  [12-27] 18 (09/29 1545) BP: (78-140)/(37-103) 124/48 mmHg (09/29  1545) SpO2:  [96 %-100 %] 98 % (09/29 1545) Weight:  [187 lb 2.7 oz (84.9 kg)] 187 lb 2.7 oz (84.9 kg) (09/29 0400)  General - Well nourished, well developed, in no apparent distress.  Ophthalmologic - not able to see through.  Cardiovascular - Regular rate and rhythm with no murmur.  Mental Status -  Level of arousal and orientation to time, place, and person were intact. Language including expression, naming, repetition, comprehension was assessed and found intact.  Cranial Nerves II - XII - II - Visual field intact OU. III, IV, VI - Extraocular movements intact. V - Facial sensation intact bilaterally. VII - Facial movement intact bilaterally. VIII - Hearing & vestibular intact bilaterally. X - Palate elevates symmetrically. XI - Chin turning & shoulder shrug intact bilaterally. XII - Tongue protrusion intact.  Motor Strength - The patient's strength was 4/5 BUEs and 4/5 LLE but 3/5 RLE and pronator drift was absent.  Bulk was normal and fasciculations were absent.  Less pronounced b/l arm asterixis, still consistent with metabolic encephalopathy. Motor Tone - Muscle tone was assessed at the neck and appendages and was normal.  Reflexes - The patient's reflexes were 1+ in all extremities and she had no pathological reflexes.  Sensory - Light touch, temperature/pinprick were assessed and were symmetrical.    Coordination - The patient had normal movements in the hands with no ataxia or dysmetria.  Tremor was absent but BUE asterixis.  Gait and Station - not tested.   ASSESSMENT/PLAN  Ms. Kaitlyn AYANA IMHOF is a 80 y.o. female with history of DM, HTN, HLD, s/p CABG and MVR on 11/08/13 was consulted for AMS and mutism as well as breathing difficulty. She is on coumadin and INR so far therapeutic. The presentation is not typical for stroke especially no focal neuro exam. EEG largely metabolic encephalopathy but did raise concern of NCSE, vimpat loaded and maintain does 50 mg bid.  Overnight concern for seizure, vimpat up to 100mg  bid. However, LTM EEG more leaning toward metabolic encephalopathy.   Metabolic encephalopathy vs. Seizure vs. TIA    Pt condition more consistent with metabolic encephalopathy, contributing factors include b/l asterixis, renal failure, hyponatremia, and respiratory alkalosis.  Not sure why pt is on coumadin. Although EEG concerning for seizure, but largely it showed metabolic encephalopathy.    MRI  And MRA not able to perform due to recent cardiac procedure. Carotid Doppler  Bilateral: 1-39% ICA stenosis. Vertebral artery flow is antegrade.  2D Echo 55-60%  warfarin prior to admission, now on warfarin, therapeutic INR 2.25  repeat CT head in am to look for any new infarct after LTM EEG discontinued.  LDL 45, no statin necessary as meeting goal of LDL <100  HgbA1c 7.2, elevated.  Warfarin for VTE prophylaxis  Dysphagia    Seizure - on vimpat 100mg  bid - on LTM EEG - leaning towards metabolic encephalopathy - continue LTM EEG.  Metabolic encephalopathy - much improved, mental status better today - Cre stable at 2.6, Na up to 130 - recommend to check ammonia level and ABG  Hypertension   Permissive hypertension <220/120 for 24-48 hours and then gradually normalize within 5-7 days  BP goal long term normotensive  Stable  Patient counseled to be compliant with her blood pressure medications  Hyperlipidemia  Home meds:  None. Will not start hospital.  LDL 45, goal < 100 (<70 for diabetics)  Continue statin at discharge  Diabetes  HgbA1c 7.2, Goal < 7.0  Uncontrolled  SSI   Educated patient about lifestyle changes for diabetes treatment prevention  Other Stroke Risk Factors - s/p CABG and MVR - acute renal failure  Hospital day # 58  Rosalin Hawking, MD PhD Stroke Neurology 12/03/2013 4:47 PM    To contact Stroke Continuity provider, please refer to [email or web address] After hours, contact General Neurology

## 2013-12-03 NOTE — Progress Notes (Signed)
Pt continued to be less responsive, not following commands, not speaking. Noted a change in EEG waveform and mild twitching of face, Left shoulder and left leg. Dr Armida Sans notified, orders received.

## 2013-12-03 NOTE — Progress Notes (Signed)
NUTRITION FOLLOW UP  Intervention:  Consider short-term nutrition support initiation  Continue Glucerna Shake po daily, each supplement provides 350 kcal and 13 grams of protein RD to follow for nutrition care plan  Nutrition Dx: Increased nutrient needs related to post-op healing as evidenced by estimated nutrition needs, ongoing  Goal: Pt to meet >/= 90% of their estimated nutrition needs, unmet  ASSESSMENT: 80 year old female with extensive cardiac history, admitted on 9/1 with chest pain.   Patient s/p procedures 9/5: CORONARY ARTERY BYPASS GRAFTING x 2 MITRAL VALVE REPLACEMENT   Patient extubated 9/6.  Patient currently on a continuous EEG.  Not responsive to RD upon visit.  Diet changed to Dys 2 with honey thick liquids.  Noted Speech Path not following at this time.  PO intake remains poor at 20-30% per flowsheet records.  Pt nodded her head "No" to RD when asked if she's been drinking her Glucerna Shake supplements.  Height: Ht Readings from Last 1 Encounters:  11/05/13 5\' 7"  (1.702 m)    Weight: Wt Readings from Last 1 Encounters:  12/03/13 187 lb 2.7 oz (84.9 kg)    BMI:  Body mass index is 29.31 kg/(m^2).    Estimated Nutritional Needs: Kcal: 1700-1900 Protein: 90-100 gm  Fluid: 1.7-1.9 L  Skin: surgical incisions to legs and chest  Diet Order: Dysphagia 2, honey thick liquids   Intake/Output Summary (Last 24 hours) at 12/03/13 1441 Last data filed at 12/03/13 0700  Gross per 24 hour  Intake  631.5 ml  Output   1080 ml  Net -448.5 ml    Labs:   Recent Labs Lab 12/01/13 0339 12/02/13 0416 12/03/13 0337  NA 127* 129* 130*  K 4.0 3.5* 3.9  CL 82* 85* 88*  CO2 31 30 30   BUN 52* 46* 44*  CREATININE 2.83* 2.62* 2.64*  CALCIUM 8.8 8.6 8.7  GLUCOSE 120* 152* 134*    CBG (last 3)   Recent Labs  12/02/13 2343 12/03/13 0312 12/03/13 0752  GLUCAP 183* 139* 102*    Scheduled Meds: . [START ON 12/04/2013] amiodarone  200 mg Oral Daily   . ceFEPime (MAXIPIME) IV  2 g Intravenous Q24H  . citalopram  20 mg Oral Daily  . darbepoetin (ARANESP) injection - NON-DIALYSIS  100 mcg Subcutaneous Q Wed-1800  . feeding supplement (GLUCERNA SHAKE)  237 mL Oral Q1500  . furosemide  80 mg Oral BID  . insulin aspart  0-24 Units Subcutaneous 6 times per day  . lacosamide (VIMPAT) IV  100 mg Intravenous Q12H  . magic mouthwash  5 mL Oral TID  . neomycin-bacitracin-polymyxin   Topical Daily  . nystatin cream   Topical TID  . pantoprazole  40 mg Oral Daily  . sodium chloride  10-40 mL Intracatheter Q12H  . sodium chloride  3 mL Intravenous Q12H  . warfarin  2 mg Oral q1800  . Warfarin - Physician Dosing Inpatient   Does not apply q1800    Continuous Infusions: . sodium chloride 10 mL/hr at 11/29/13 0700  . sodium chloride 250 mL (11/21/13 1541)  . sodium chloride 50 mL/hr at 12/02/13 1814  . DOPamine 2.5 mcg/kg/min (12/02/13 1817)    Past Medical History  Diagnosis Date  . GERD (gastroesophageal reflux disease)   . PUD (peptic ulcer disease)   . Anemia   . Hypertension   . S/P endoscopy Aug 2011    3 superficial gastric ulcers, NSAID-induced  . S/P colonoscopy Sept 2011    left-sided diverticula,  tubular adenoma  . Coronary artery disease   . Shortness of breath   . Diabetes mellitus     insulin dependent  . Headache(784.0)     rare migraines  . Cancer     hx of skin cancer  . Arthritis   . Osteopenia   . Hypercholesterolemia   . SIADH (syndrome of inappropriate ADH production)   . CHF (congestive heart failure)   . Myocardial infarction 2013    Past Surgical History  Procedure Laterality Date  . Tonsillectomy    . Appendectomy    . Eye surgery      cataracts  . Cardiac stents  07/19/2011  . Esophagogastroduodenoscopy  10/16/09    normal without barrett's/three superficial gastric ulcers  . Colonoscopy  12/04/09    normal rectum/left-sided diverticula  . Joint replacement  arthroscopy to knee  . Toe  amputation Left 2013    left second toe amputation  . Cardiac catheterization  01/22/2013  . Coronary angioplasty  07/2011  . Coronary artery bypass graft N/A 11/08/2013    Procedure: CORONARY ARTERY BYPASS GRAFTING (CABG), on pump, times two, using left internal mammary artery, cryo saphenous vein.;  Surgeon: Ivin Poot, MD;  Location: [place];  Service: Open Heart Surgery;  Laterality: N/A;  LIMA-LAD CRYOVEIN -OM  . Intraoperative transesophageal echocardiogram N/A 11/08/2013    Procedure: INTRAOPERATIVE TRANSESOPHAGEAL ECHOCARDIOGRAM;  Surgeon: Ivin Poot, MD;  Location: [place];  Service: Open Heart Surgery;  Laterality: N/A;  . Mitral valve replacement N/A 11/08/2013    Procedure: MITRAL VALVE (MV) REPLACEMENT;  Surgeon: Ivin Poot, MD;  Location: [place];  Service: Open Heart Surgery;  Laterality: N/A;  #25 MAGNA MITRAL EASE    Arthur Holms, RD, LDN Pager #: [phone] After-Hours Pager #: [phone]

## 2013-12-03 NOTE — Progress Notes (Signed)
[facility] BRIEF SICU PROGRESS NOTE  25 Days Post-Op  S/P Procedure(s) (LRB): CORONARY ARTERY BYPASS GRAFTING (CABG), on pump, times two, using left internal mammary artery, cryo saphenous vein. (N/A) INTRAOPERATIVE TRANSESOPHAGEAL ECHOCARDIOGRAM (N/A) MITRAL VALVE (MV) REPLACEMENT (N/A)   Stable day  Plan: Continue current plan  OWEN,CLARENCE H 12/03/2013 7:41 PM

## 2013-12-03 NOTE — Progress Notes (Signed)
Physical Therapy Treatment Patient Details Name: Claudia [name] MRN: [id number] DOB: [birth year] Today's Date: 12/03/2013    History of Present Illness 80 y.o. female admitted to [facility] on 11/05/13 with chest pain.  She is now s/p emergency CABG x 2 and MVR on 11/09/13.  PMHx includes HTN, MI, DM.  Post-op course complicated by possible seizure activity.  Continuous EEG in progress (12/03/13).     PT Comments    Pt's mobility limited today due to seizure workup in progress and pt connected to continuous EEG at this time (RN reports we got approval for OOB to the chair even with EEG running).  Pt requiring two person mod assist to get from bed to chair with significant posterior lean in standing over buckling legs.  She reports lightheadedness in sitting and standing and cognition seems to wax and wain during our session.  She is oriented, but seems slow to process and her attention is poor.  PT will continue to follow acutely and pt is appropriate for SNF level rehab.    Follow Up Recommendations  SNF     Equipment Recommendations  None recommended by PT    Recommendations for Other Services OT consult     Precautions / Restrictions Precautions Precautions: Fall;Sternal Precaution Comments: orthostatic Restrictions Other Position/Activity Restrictions: sternal precautions    Mobility  Bed Mobility Overal bed mobility: Needs Assistance Bed Mobility: Rolling;Sidelying to Sit Rolling: Mod assist Sidelying to sit: Mod assist       General bed mobility comments: Mod assist to support trunk during transitions. Verbal cues to remind her not to use her arms to pull on the bed rail due to sternal precautions.    Transfers Overall transfer level: Needs assistance Equipment used: 2 person hand held assist Transfers: Sit to/from [facility] Sit to Stand: +2 physical assistance;Mod assist Stand pivot transfers: +2 physical assistance;Mod assist       General transfer  comment: Two person mod assist to support trunk over weak legs.  Pt with significant posterior lean during pivot to the chair and bil legs tend to buckle.  She did report lightheadedness in both sitting and standing (previous therapy notes warned about orthostasis-unable to check standing BP due to poor standing tolerance/weakness).   Ambulation/Gait             General Gait Details: unable to assess due to connected to countinous EEG, and really MD's goal for her today was to get OOB to chair.           Balance Overall balance assessment: Needs assistance Sitting-balance support: Feet supported;Bilateral upper extremity supported Sitting balance-Leahy Scale: Poor Sitting balance - Comments: needs support in sitting Postural control: Posterior lean Standing balance support: Bilateral upper extremity supported Standing balance-Leahy Scale: Poor Standing balance comment: Needs external support in standing.                     Cognition Arousal/Alertness: Awake/alert Behavior During Therapy: WFL for tasks assessed/performed Overall Cognitive Status: Impaired/Different from baseline Area of Impairment: Attention;Problem solving   Current Attention Level: Sustained         Problem Solving: Slow processing;Decreased initiation;Difficulty sequencing;Requires verbal cues;Requires tactile cues General Comments: At times pt seems present and oriented.  Able to tell me correct month, place, and situation, but at times talking about things that are completely out of context.  She also seems slow to process at times and has times during our session where she seems to zone out  and needs to be redirected to task.  She did start to have some ticks as we started to move her EOB (bil uppers and lowers with rythmic ticking/twitching).            Pertinent Vitals/Pain Pain Assessment: 0-10 Pain Score: 8  Pain Location: incision Pain Descriptors / Indicators: Aching Pain  Intervention(s): Limited activity within patient's tolerance;Monitored during session;Repositioned           PT Goals (current goals can now be found in the care plan section) Acute Rehab PT Goals Patient Stated Goal: Return home and see Cassie (grandaughter) Progress towards PT goals: Not progressing toward goals - comment (set back by seizure workup)    Frequency  Min 2X/week    PT Plan Current plan remains appropriate       End of Session   Activity Tolerance: Patient limited by fatigue Patient left: in chair;with call bell/phone within reach     Time: 3491-7915 PT Time Calculation (min): 22 min  Charges:  $Therapeutic Activity: 8-22 mins                      Angalena Cousineau B. [place], [place], DPT [phone]   12/03/2013, 2:36 PM

## 2013-12-03 NOTE — Progress Notes (Signed)
I have seen and examined this patient and agree with the plan of care   [facility] W 12/03/2013, 11:45 AM

## 2013-12-03 NOTE — Progress Notes (Signed)
Pt showing increased cognitive changes, pt not communicating at all, has a blank stare, unable to answer any questions, not following any commands. Pt has continuous EEG on, Dr Armida Sans notified of change in patient.

## 2013-12-03 NOTE — Progress Notes (Signed)
SLP Cancellation Note  Patient Details Name: Claudia [name] MRN: [id number] DOB: [birth year]   Cancelled treatment:       Reason Eval/Treat Not Completed: Other (comment)RN states swallow eval has been canceled.  Will sign off.    Juan Quam Laurice 12/03/2013, 11:45 AM

## 2013-12-03 NOTE — Progress Notes (Signed)
Called by patient's nurse regarding decreased responsiveness. Patient on c-EEG monitoring currently displaying a very concerning pattern of periodic generalized discharges with intermixed high amplitude theta-delta that at times becomes rhythmic. Further, patient having subtle face and left arm/sholuder twitching. 1 mg IV ativan given and was able to break EEG pattern as well as patient's twitching movements. Loaded with IV vimpat 150 mg (just received her usual night dose of 50 mg). Will increase maintenance vimpat to 100 mg iv BID.  Dorian Pod ,MD [facility]

## 2013-12-04 ENCOUNTER — Inpatient Hospital Stay (HOSPITAL_COMMUNITY): Payer: Medicare HMO

## 2013-12-04 LAB — GLUCOSE, CAPILLARY
GLUCOSE-CAPILLARY: 108 mg/dL — AB (ref 70–99)
GLUCOSE-CAPILLARY: 203 mg/dL — AB (ref 70–99)
GLUCOSE-CAPILLARY: 284 mg/dL — AB (ref 70–99)
GLUCOSE-CAPILLARY: 63 mg/dL — AB (ref 70–99)
Glucose-Capillary: 190 mg/dL — ABNORMAL HIGH (ref 70–99)
Glucose-Capillary: 150 mg/dL — ABNORMAL HIGH (ref 70–99)
Glucose-Capillary: 137 mg/dL — ABNORMAL HIGH (ref 70–99)
Glucose-Capillary: 125 mg/dL — ABNORMAL HIGH (ref 70–99)

## 2013-12-04 LAB — BASIC METABOLIC PANEL
Anion gap: 12 (ref 5–15)
BUN: 46 mg/dL — ABNORMAL HIGH (ref 6–23)
CO2: 29 mEq/L (ref 19–32)
Calcium: 8.5 mg/dL (ref 8.4–10.5)
Chloride: 92 mEq/L — ABNORMAL LOW (ref 96–112)
Creatinine, Ser: 2.75 mg/dL — ABNORMAL HIGH (ref 0.50–1.10)
GFR calc Af Amer: 18 mL/min — ABNORMAL LOW (ref >90)
GFR calc non Af Amer: 15 mL/min — ABNORMAL LOW (ref >90)
Glucose, Bld: 83 mg/dL (ref 70–99)
Potassium: 3.8 mEq/L (ref 3.7–5.3)
Sodium: 133 mEq/L — ABNORMAL LOW (ref 137–147)

## 2013-12-04 LAB — CBC
HCT: 32.8 % — ABNORMAL LOW (ref 36.0–46.0)
Hemoglobin: 10.7 g/dL — ABNORMAL LOW (ref 12.0–15.0)
MCH: 28.4 pg (ref 26.0–34.0)
MCHC: 32.6 g/dL (ref 30.0–36.0)
MCV: 87 fL (ref 78.0–100.0)
Platelets: 229 10*3/uL (ref 150–400)
RBC: 3.77 MIL/uL — ABNORMAL LOW (ref 3.87–5.11)
RDW: 15.7 % — ABNORMAL HIGH (ref 11.5–15.5)
WBC: 6.1 10*3/uL (ref 4.0–10.5)

## 2013-12-04 LAB — AMMONIA: AMMONIA: 22 umol/L (ref 11–60)

## 2013-12-04 LAB — PROTIME-INR
INR: 2.28 — ABNORMAL HIGH (ref 0.00–1.49)
Prothrombin Time: 25.1 seconds — ABNORMAL HIGH (ref 11.6–15.2)

## 2013-12-04 MED ORDER — RESOURCE THICKENUP CLEAR PO POWD
ORAL | Status: DC | PRN
  Filled 2013-12-04: qty 125

## 2013-12-04 NOTE — Progress Notes (Signed)
LTM discontinued per Dr Erlinda Hong

## 2013-12-04 NOTE — Progress Notes (Signed)
I have seen and examined this patient and agree with the plan of care .  Claudia [name] W 12/04/2013, 12:34 PM

## 2013-12-04 NOTE — Progress Notes (Signed)
STROKE TEAM PROGRESS NOTE   HISTORY Claudia [name] is an 80 y.o. 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 Has had a complicated recovery. Today when family entered the room the patient was breathing fast and loud. Patient was also unable to recognize family members. Nursing was contacted at that time and code stroke was called.  Patient is on Coumadin with an INR of 2.16.  Date last known well: Date: 12/01/2013  Time last known well: Time: 09:30  tPA Given: No: On Coumadin with an elevated INR   SUBJECTIVE (INTERVAL HISTORY) Her RN is at the bedside. She is more awake alert today. No mental status wax and wane overnight. EEG LTM showed less triphasic waves and no seizure. Will d/c LTM EEG. INR therapeutic.   OBJECTIVE Temp:  [97.6 F (36.4 C)-98.6 F (37 C)] 97.9 F (36.6 C) (09/30 1209) Pulse Rate:  [73-80] 80 (09/30 1100) Cardiac Rhythm:  [-] Normal sinus rhythm (09/30 0735) Resp:  [10-31] 18 (09/30 1100) BP: (78-144)/(37-99) 119/47 mmHg (09/30 1100) SpO2:  [95 %-100 %] 99 % (09/30 1100) Weight:  [196 lb 12.8 oz (89.268 kg)] 196 lb 12.8 oz (89.268 kg) (09/30 0600)   Recent Labs Lab 12/03/13 1510 12/03/13 2330 12/04/13 0340 12/04/13 0728 12/04/13 0821  GLUCAP 190* 284* 150* 63* 108*    Recent Labs Lab 11/30/13 0430 12/01/13 0339 12/02/13 0416 12/03/13 0337 12/04/13 0500  NA 125* 127* 129* 130* 133*  K 4.0 4.0 3.5* 3.9 3.8  CL 78* 82* 85* 88* 92*  CO2 33* 31 30 30 29   GLUCOSE 120* 120* 152* 134* 83  BUN 55* 52* 46* 44* 46*  CREATININE 2.73* 2.83* 2.62* 2.64* 2.75*  CALCIUM 8.8 8.8 8.6 8.7 8.5    Recent Labs Lab 12/03/13 0337  AST 12  ALT 10  ALKPHOS 69  BILITOT 0.4  PROT 6.3  ALBUMIN 2.5*    Recent Labs Lab 11/30/13 0430 12/01/13 0339 12/02/13 0416 12/03/13 0337 12/04/13 0500  WBC 7.6 7.8 6.9 6.9 6.1  HGB 11.0* 10.9* 10.8* 10.8* 10.7*  HCT 33.8* 33.5* 33.6* 33.8* 32.8*  MCV 88.5 86.3 86.4 87.3 87.0  PLT 206 223 231 218 229   No  results found for this basename: CKTOTAL, CKMB, CKMBINDEX, TROPONINI,  in the last 168 hours  Recent Labs  12/02/13 0416 12/03/13 0337 12/04/13 0500  LABPROT 23.5* 24.9* 25.1*  INR 2.09* 2.25* 2.28*   No results found for this basename: COLORURINE, APPERANCEUR, LABSPEC, PHURINE, GLUCOSEU, HGBUR, BILIRUBINUR, KETONESUR, PROTEINUR, UROBILINOGEN, NITRITE, LEUKOCYTESUR,  in the last 72 hours     Component Value Date/Time   CHOL 125 12/03/2012 0355   TRIG 42 12/03/2012 0355   HDL 72 12/03/2012 0355   CHOLHDL 1.7 12/03/2012 0355   VLDL 8 12/03/2012 0355   LDLCALC 45 12/03/2012 0355   Lab Results  Component Value Date   HGBA1C 7.2* 11/06/2013   No results found for this basename: labopia,  cocainscrnur,  labbenz,  amphetmu,  thcu,  labbarb    No results found for this basename: ETH,  in the last 168 hours  Ct Head Wo Contrast  12/01/2013   IMPRESSION: 1. Atrophy and small vessel disease. 2.  No evidence for acute intracranial abnormality.      Dg Chest Port 1 View  12/02/2013    IMPRESSION: Stable appearance of the chest when compare with the prior exam.      12/01/2013   IMPRESSION: Enlargement of cardiac silhouette post MVR.  Bibasilar atelectasis with  additional atelectasis or scarring in RIGHT upper lobe.     CUS - 11/05/13 - Bilateral: 1-39% ICA stenosis. Vertebral artery flow is antegrade.  2D echo - 11/05/13 - - Left ventricle: The cavity size was normal. There was mild concentric hypertrophy. Systolic function was normal. The estimated ejection fraction was in the range of 55% to 60%. Hypokinesis of the basal-midinferolateral and inferior myocardium. Features are consistent with a pseudonormal left ventricular filling pattern, with concomitant abnormal relaxation and increased filling pressure (grade 2 diastolic dysfunction). Doppler parameters are consistent with both elevated ventricular end-diastolic filling pressure and elevated left atrial filling pressure. - Aortic valve:  There was trivial regurgitation. - Mitral valve: Consider papillar muscle dysfunction with wall motion abnormality. Normal-sized, mildly to moderately calcified annulus. Moderately thickened, mildly calcified leaflets posterior. Mobility of the posterior leaflet was mildly restricted. There was moderate to severe regurgitation directed posteriorly. - Left atrium: The atrium was moderately dilated. - Atrial septum: The septum bowed from left to right, consistent with increased left atrial pressure.  LTM EEG 9/28-9/29: Triphasic activity is consistent with a moderate diffuse encephalopathy, consistent with the moderate diffuse slowing in the fragmented slow posterior rhythm; it further suggests a metabolic etiology. As noted in the report below there some aspects of the recording that raise a concern for nonconvulsive status epilepticus. However, this generalized triphasic activity tends to vary with state and disappears during sleep, which argues against nonconvulsive seizure. Further, after the prolonged periods of periodic discharges, there are no obvious focal regions of postictal slowing, as might be expected after prolonged focal seizure activity. Overall, most consistent with moderate diffuse encephalopathy.  LTM EEG 9/28-9/30 - official report pending  PHYSICAL EXAM  Temp:  [97.6 F (36.4 C)-98.6 F (37 C)] 97.9 F (36.6 C) (09/30 1209) Pulse Rate:  [73-80] 80 (09/30 1100) Resp:  [10-31] 18 (09/30 1100) BP: (78-144)/(37-99) 119/47 mmHg (09/30 1100) SpO2:  [95 %-100 %] 99 % (09/30 1100) Weight:  [196 lb 12.8 oz (89.268 kg)] 196 lb 12.8 oz (89.268 kg) (09/30 0600)  General - Well nourished, well developed, in no apparent distress.  Ophthalmologic - not able to see through.  Cardiovascular - Regular rate and rhythm with no murmur.  Mental Status -  Level of arousal and orientation to time, place, and person were intact. Language including expression, naming, repetition,  comprehension was assessed and found intact.  Cranial Nerves II - XII - II - Visual field intact OU. III, IV, VI - Extraocular movements intact. V - Facial sensation intact bilaterally. VII - Facial movement intact bilaterally. VIII - Hearing & vestibular intact bilaterally. X - Palate elevates symmetrically. XI - Chin turning & shoulder shrug intact bilaterally. XII - Tongue protrusion intact.  Motor Strength - The patient's strength was 4/5 BUEs and 4/5 LLE but 4/5 RLE and pronator drift was absent.  Bulk was normal and fasciculations were absent.  Less pronounced b/l arm asterixis, still consistent with metabolic encephalopathy. Motor Tone - Muscle tone was assessed at the neck and appendages and was normal.  Reflexes - The patient's reflexes were 1+ in all extremities and she had no pathological reflexes.  Sensory - Light touch, temperature/pinprick were assessed and were symmetrical.    Coordination - The patient had normal movements in the hands with no ataxia or dysmetria.  Tremor was absent but BUE asterixis.  Gait and Station - not tested.   ASSESSMENT/PLAN  Ms. Claudia [name] is a 80 y.o. female with history of DM, HTN,  HLD, s/p CABG and MVR on 11/08/13 was consulted for AMS and mutism as well as breathing difficulty. She is on coumadin and INR so far therapeutic. The presentation is not typical for stroke especially no focal neuro exam. EEG largely metabolic encephalopathy but did raise concern of NCSE, vimpat loaded and maintain does 50 mg bid. Overnight concern for seizure, vimpat up to 100mg  bid. However, LTM EEG more leaning toward metabolic encephalopathy.   Metabolic encephalopathy vs. Seizure vs. TIA    Pt condition more consistent with metabolic encephalopathy, contributing factors include b/l asterixis, renal failure, hyponatremia, and respiratory alkalosis.  Not sure why pt is on coumadin. Although EEG concerning for seizure, but largely it showed metabolic  encephalopathy.   MRI  And MRA not able to perform due to recent cardiac procedure. Carotid Doppler  Bilateral: 1-39% ICA stenosis. Vertebral artery flow is antegrade.  2D Echo 55-60%  warfarin prior to admission, now on warfarin, therapeutic INR 2.25 -> 2.28  repeat CT head today to look for any new infarct after LTM EEG discontinued.  LDL 45, no statin necessary as meeting goal of LDL <100  HgbA1c 7.2, elevated.  Warfarin for VTE prophylaxis  Seizure - initially EEG concerning for NCSE, but more leaning towards encephalopathy - continue vimpat 100mg  bid - overnight LTM EEG no seizure and less triphasic waves. - d/c LTM EEG today.  Metabolic encephalopathy - much improved, mental status better today - less asterixis seen today - Cre stable at 2.6-2.8, Na up to 133 - ammonia level WNL  Hypertension   Permissive hypertension <220/120 for 24-48 hours and then gradually normalize within 5-7 days  BP goal long term normotensive  Stable  Patient counseled to be compliant with her blood pressure medications  Hyperlipidemia  Home meds:  None. Will not start hospital.  LDL 45, goal < 100 (<70 for diabetics)  Continue statin at discharge  Diabetes  HgbA1c 7.2, Goal < 7.0  Uncontrolled  SSI   Educated patient about lifestyle changes for diabetes treatment prevention  Other Stroke Risk Factors - s/p CABG and MVR - acute renal failure  Hospital day # 75  Rosalin Hawking, MD PhD Stroke Neurology 12/04/2013 12:42 PM    To contact Stroke Continuity provider, please refer to [email or web address] After hours, contact General Neurology

## 2013-12-04 NOTE — Progress Notes (Addendum)
      [address]       [place], [address]             [phone]      26 Days Post-Op Procedure(s) (LRB): CORONARY ARTERY BYPASS GRAFTING (CABG), on pump, times two, using left internal mammary artery, cryo saphenous vein. (N/A) INTRAOPERATIVE TRANSESOPHAGEAL ECHOCARDIOGRAM (N/A) MITRAL VALVE (MV) REPLACEMENT (N/A)  Subjective:  Ms. Claudia [name] states that she feels fine.  She has no new complaints.  She is alert and oriented this morning.  Possibly undergoing head CT scan  Objective: Vital signs in last 24 hours: Temp:  [96.9 F (36.1 C)-98.6 F (37 C)] 97.9 F (36.6 C) (09/30 0730) Pulse Rate:  [72-80] 77 (09/30 0700) Cardiac Rhythm:  [-] Normal sinus rhythm (09/30 0735) Resp:  [10-31] 10 (09/30 0700) BP: (78-144)/(37-99) 117/45 mmHg (09/30 0700) SpO2:  [95 %-100 %] 97 % (09/30 0700) Weight:  [196 lb 12.8 oz (89.268 kg)] 196 lb 12.8 oz (89.268 kg) (09/30 0600)  Intake/Output from previous day: 09/29 0701 - 09/30 0700 In: 2194.3 [P.O.:600; I.V.:1474.3; IV Piggyback:120] Out: 1970 [[id number]]  General appearance: alert, cooperative and no distress Heart: regular rate and rhythm Lungs: diminished breath sounds left base Abdomen: soft, non-tender; bowel sounds normal; no masses,  no organomegaly Extremities: edema greatly improved, trace to 1+ Wound: clean and dry, staples in place  Lab Results:  Recent Labs  12/03/13 0337 12/04/13 0500  WBC 6.9 6.1  HGB 10.8* 10.7*  HCT 33.8* 32.8*  PLT 218 229   BMET:  Recent Labs  12/03/13 0337 12/04/13 0500  NA 130* 133*  K 3.9 3.8  CL 88* 92*  CO2 30 29  GLUCOSE 134* 83  BUN 44* 46*  CREATININE 2.64* 2.75*  CALCIUM 8.7 8.5    PT/INR:  Recent Labs  12/04/13 0500  LABPROT 25.1*  INR 2.28*   ABG    Component Value Date/Time   PHART 7.550* 12/01/2013 1302   HCO3 28.1* 12/01/2013 1302   TCO2 29.1 12/01/2013 1302   ACIDBASEDEF 7.0* 11/11/2013 1642   O2SAT 98.0 12/01/2013 1302   CBG (last 3)    Recent Labs  12/03/13 1510 12/03/13 2330 12/04/13 0340  GLUCAP 190* 284* 150*    Assessment/Plan: S/P Procedure(s) (LRB): CORONARY ARTERY BYPASS GRAFTING (CABG), on pump, times two, using left internal mammary artery, cryo saphenous vein. (N/A) INTRAOPERATIVE TRANSESOPHAGEAL ECHOCARDIOGRAM (N/A) MITRAL VALVE (MV) REPLACEMENT (N/A)  1. CV- Hemodynamically stable, maintaining NSR, remains on Dopamine 2. Pulm- wean oxygen as tolerated, small left pleural effusion 3. Renal- creatinine elevated today, Na level improving, Nephrology following 4. Neurology- suspected seizure activity, EEG running, Neurology following, for CT scan today 5. DM- sugars remain well controlled 6. ID- Serratia Bacteremia, on Cefipime today is Day 8, can likely stop after 10 days of therapy 7. Dispo- patient with possible seizure activity on EEG, creatinine increased today, continue diuresis, will keep in ICU  LOS: 29 days    BARRETT, ERIN 12/04/2013  Advance diet to heart healthy, carb modified Wean off renal dopamine Seizure therapy per neuro Coumadin for tissue MVR and PAF Surgical incisions healing, INR therapeutic LTAC assessment requested patient examined and medical record reviewed,agree with above note. VAN TRIGT III,Annalese Stiner 12/04/2013

## 2013-12-04 NOTE — Progress Notes (Signed)
Subjective:   Patient seen at bedside this AM. No overnight events.  Cr 2.75 this AM, urine output 1.9L over past 24 hours.  Weight increased to 196 lbs 12 oz? (from 187); net I/O -400 cc total in 24 hours. Na 133, improving.   Objective Vital signs in last 24 hours: Filed Vitals:   12/04/13 0300 12/04/13 0346 12/04/13 0400 12/04/13 0500  BP: 119/47  116/51 123/46  Pulse: 74  73 73  Temp:  98.5 F (36.9 C)    TempSrc:  Oral    Resp: 19  31 14   Height:      Weight:      SpO2: 96%  97% 97%   Weight change:   Intake/Output Summary (Last 24 hours) at 12/04/13 0646 Last data filed at 12/04/13 0500  Gross per 24 hour  Intake 1589.2 ml  Output   1895 ml  Net -305.8 ml   Physical Exam:  General: Obese female, alert, cooperative, NAD. On 24 hour EEG.  HEENT: PERRL, EOMI. Moist mucus membranes Neck: Full range of motion without pain, supple, no lymphadenopathy or carotid bruits. RIJ CVL.  Lungs: Clear to ascultation bilaterally, normal work of respiration, no wheezes, rales, rhonchi.  Heart: RRR, no murmurs, gallops, or rubs Abdomen: Soft, non-tender, non-distended, BS +  Extremities: +Trace pitting edema.  Neurologic: Alert & oriented x3, cranial nerves II-XII intact, strength grossly intact, sensation intact to light touch   Labs: Basic Metabolic Panel:  Recent Labs Lab 12/02/13 0416 12/03/13 0337 12/04/13 0500  NA 129* 130* 133*  K 3.5* 3.9 3.8  CL 85* 88* 92*  CO2 30 30 29   GLUCOSE 152* 134* 83  BUN 46* 44* 46*  CREATININE 2.62* 2.64* 2.75*  CALCIUM 8.6 8.7 8.5     Recent Labs Lab 11/30/13 0430 12/01/13 0339 12/02/13 0416 12/03/13 0337 12/04/13 0500  WBC 7.6 7.8 6.9 6.9 6.1  HGB 11.0* 10.9* 10.8* 10.8* 10.7*  HCT 33.8* 33.5* 33.6* 33.8* 32.8*  MCV 88.5 86.3 86.4 87.3 87.0  PLT 206 223 231 218 229    Recent Labs Lab 12/03/13 0752 12/03/13 1126 12/03/13 1510 12/03/13 2330 12/04/13 0340  GLUCAP 102* 141* 190* 284* 150*     Medications: Infusions: . sodium chloride 10 mL/hr at 11/29/13 0700  . sodium chloride 250 mL (11/21/13 1541)  . sodium chloride 50 mL/hr at 12/04/13 0500  . DOPamine 2.5 mcg/kg/min (12/04/13 0500)    Scheduled Medications: . amiodarone  200 mg Oral Daily  . ceFEPime (MAXIPIME) IV  2 g Intravenous Q24H  . citalopram  20 mg Oral Daily  . darbepoetin (ARANESP) injection - NON-DIALYSIS  100 mcg Subcutaneous Q Wed-1800  . feeding supplement (GLUCERNA SHAKE)  237 mL Oral TID WC  . furosemide  80 mg Oral BID  . insulin aspart  0-24 Units Subcutaneous 6 times per day  . lacosamide (VIMPAT) IV  100 mg Intravenous Q12H  . magic mouthwash  5 mL Oral TID  . neomycin-bacitracin-polymyxin   Topical Daily  . nystatin cream   Topical TID  . pantoprazole  40 mg Oral Daily  . sodium chloride  10-40 mL Intracatheter Q12H  . sodium chloride  3 mL Intravenous Q12H  . warfarin  2 mg Oral q1800  . Warfarin - Physician Dosing Inpatient   Does not apply q35    Background 80 year old female with multiple medical issues including stage III CKD who is status post CABG and mitral valve replacement with postoperative acute on chronic renal failure (baseline  creatinine around 1.5), marked volume overload (50 lb +; improved and near preoperative weight)  Assessment/Recommendations  Renal- Lasix decreased to 80 mg po bid 12/03/13. Weight recorded as increased overnight even though net -400 cc over past 24 hours. Cr 2.75 today. Continue w/ Lasix 80 mg po bid for now.  Acute Encephalopathy- Seizure identified by EEG, started on Vimpat. Some question of metabolic encephalopathy as etiology. Neurology following.  Hypertension/volume- Improved, at baseline. Continue diuretic program as above. Follow daily weights, UOP. Serratia Bacteremia- Tracheal aspirate and blood cultures 2/2 positive for serratia marcescens. Transitioned from Zosyn to Cefepime on 11/25/13.    Anemia- Situational and CKD- continue weekly  aranesp. Iron studies from 9/17 w/ iron of 19, ferritin of 604, Tsat of 7%.   No IV Fe given serratia infection. Hb stable.  Hypokalemia-  3.8 this AM. Continue to monitor.  Hyponatremia- Stable and improved to 133 this AM, limit free water intake.   S/p CABG/MVR AFib on heparin, BB, amio po- In NSR, on po amio    Signed: Luanne Bras, MD 12/04/2013 6:46 AM

## 2013-12-04 NOTE — Progress Notes (Signed)
Resting comfortably at present  BP 97/38  Pulse 69  Temp(Src) 97.8 F (36.6 C) (Oral)  Resp 21  Ht 5\' 7"  (1.702 m)  Wt 196 lb 12.8 oz (89.268 kg)  BMI 30.82 kg/m2  SpO2 97%   Intake/Output Summary (Last 24 hours) at 12/04/13 1647 Last data filed at 12/04/13 1600  Gross per 24 hour  Intake 1544.83 ml  Output   1815 ml  Net -270.17 ml    She is to have head CT this evening for focal seizures

## 2013-12-05 DIAGNOSIS — N179 Acute kidney failure, unspecified: Secondary | ICD-10-CM

## 2013-12-05 DIAGNOSIS — E871 Hypo-osmolality and hyponatremia: Secondary | ICD-10-CM

## 2013-12-05 DIAGNOSIS — G9341 Metabolic encephalopathy: Secondary | ICD-10-CM

## 2013-12-05 LAB — BASIC METABOLIC PANEL
Anion gap: 14 (ref 5–15)
BUN: 50 mg/dL — ABNORMAL HIGH (ref 6–23)
CO2: 27 mEq/L (ref 19–32)
Calcium: 8.4 mg/dL (ref 8.4–10.5)
Chloride: 92 mEq/L — ABNORMAL LOW (ref 96–112)
Creatinine, Ser: 3.32 mg/dL — ABNORMAL HIGH (ref 0.50–1.10)
GFR calc Af Amer: 14 mL/min — ABNORMAL LOW (ref >90)
GFR calc non Af Amer: 12 mL/min — ABNORMAL LOW (ref >90)
Glucose, Bld: 103 mg/dL — ABNORMAL HIGH (ref 70–99)
Potassium: 3.8 mEq/L (ref 3.7–5.3)
Sodium: 133 mEq/L — ABNORMAL LOW (ref 137–147)

## 2013-12-05 LAB — GLUCOSE, CAPILLARY
GLUCOSE-CAPILLARY: 107 mg/dL — AB (ref 70–99)
GLUCOSE-CAPILLARY: 137 mg/dL — AB (ref 70–99)
Glucose-Capillary: 156 mg/dL — ABNORMAL HIGH (ref 70–99)
Glucose-Capillary: 171 mg/dL — ABNORMAL HIGH (ref 70–99)
Glucose-Capillary: 211 mg/dL — ABNORMAL HIGH (ref 70–99)
Glucose-Capillary: 237 mg/dL — ABNORMAL HIGH (ref 70–99)
Glucose-Capillary: 243 mg/dL — ABNORMAL HIGH (ref 70–99)

## 2013-12-05 LAB — CBC
HCT: 30.1 % — ABNORMAL LOW (ref 36.0–46.0)
Hemoglobin: 9.7 g/dL — ABNORMAL LOW (ref 12.0–15.0)
MCH: 28 pg (ref 26.0–34.0)
MCHC: 32.2 g/dL (ref 30.0–36.0)
MCV: 87 fL (ref 78.0–100.0)
Platelets: 202 10*3/uL (ref 150–400)
RBC: 3.46 MIL/uL — ABNORMAL LOW (ref 3.87–5.11)
RDW: 15.8 % — ABNORMAL HIGH (ref 11.5–15.5)
WBC: 6.2 10*3/uL (ref 4.0–10.5)

## 2013-12-05 MED ORDER — DOPAMINE-DEXTROSE 3.2-5 MG/ML-% IV SOLN
2.5000 ug/kg/min | INTRAVENOUS | Status: DC
  Administered 2013-12-05 – 2013-12-08 (×3): 2.5 ug/kg/min via INTRAVENOUS
  Filled 2013-12-05: qty 250

## 2013-12-05 MED ORDER — DOPAMINE-DEXTROSE 3.2-5 MG/ML-% IV SOLN
INTRAVENOUS | Status: AC
  Administered 2013-12-05: 2.5 ug/kg/min via INTRAVENOUS
  Filled 2013-12-05: qty 250

## 2013-12-05 MED ORDER — ALBUMIN HUMAN 5 % IV SOLN
INTRAVENOUS | Status: AC
  Administered 2013-12-05: 12.5 g via INTRAVENOUS
  Filled 2013-12-05: qty 250

## 2013-12-05 MED ORDER — ALBUMIN HUMAN 5 % IV SOLN
12.5000 g | Freq: Once | INTRAVENOUS | Status: AC
  Administered 2013-12-05: 12.5 g via INTRAVENOUS

## 2013-12-05 NOTE — Progress Notes (Signed)
[facility] ROUNDING NOTE   Subjective:   Interval History: no complaints   Objective:  Vital signs in last 24 hours:  Temp:  [97.4 F (36.3 C)-98.6 F (37 C)] 98.1 F (36.7 C) (10/01 0717) Pulse Rate:  [65-87] 82 (10/01 0900) Resp:  [11-29] 21 (10/01 0900) BP: (88-123)/(33-62) 123/49 mmHg (10/01 0900) SpO2:  [96 %-100 %] 97 % (10/01 0900) Weight:  [84.5 kg (186 lb 4.6 oz)] 84.5 kg (186 lb 4.6 oz) (10/01 0600)  Weight change: -4.768 kg (-10 lb 8.2 oz) Filed Weights   12/03/13 0400 12/04/13 0600 12/05/13 0600  Weight: 84.9 kg (187 lb 2.7 oz) 89.268 kg (196 lb 12.8 oz) 84.5 kg (186 lb 4.6 oz)    Intake/Output: I/O last 3 completed shifts: In: 2227.5 [P.O.:540; I.V.:1532.5; IV Piggyback:155] Out: 1980 [SHFWY:6378]   Intake/Output this shift:  Total I/O In: 80 [I.V.:30; IV Piggyback:50] Out: 30 [Urine:30]  General: Obese female, alert, cooperative, NAD. On 24 hour EEG.  HEENT: PERRL, EOMI. Moist mucus membranes Neck: Full range of motion without pain, supple, no lymphadenopathy or carotid bruits. RIJ CVL.  Lungs: Clear to ascultation bilaterally, normal work of respiration, no wheezes, rales, rhonchi.  Heart: RRR, no murmurs, gallops, or rubs Abdomen: Soft, non-tender, non-distended, BS +  Extremities: +Trace pitting edema.  Neurologic: Alert & oriented x3, cranial nerves II-XII intact, strength grossly intact, sensation intact to light     Basic Metabolic Panel:  Recent Labs Lab 12/01/13 0339 12/02/13 0416 12/03/13 0337 12/04/13 0500 12/05/13 0335  NA 127* 129* 130* 133* 133*  K 4.0 3.5* 3.9 3.8 3.8  CL 82* 85* 88* 92* 92*  CO2 31 30 30 29 27   GLUCOSE 120* 152* 134* 83 103*  BUN 52* 46* 44* 46* 50*  CREATININE 2.83* 2.62* 2.64* 2.75* 3.32*  CALCIUM 8.8 8.6 8.7 8.5 8.4    Liver Function Tests:  Recent Labs Lab 12/03/13 0337  AST 12  ALT 10  ALKPHOS 69  BILITOT 0.4  PROT 6.3  ALBUMIN 2.5*   No results found for this basename:  LIPASE, AMYLASE,  in the last 168 hours  Recent Labs Lab 12/04/13 0500  AMMONIA 22    CBC:  Recent Labs Lab 12/01/13 0339 12/02/13 0416 12/03/13 0337 12/04/13 0500 12/05/13 0335  WBC 7.8 6.9 6.9 6.1 6.2  HGB 10.9* 10.8* 10.8* 10.7* 9.7*  HCT 33.5* 33.6* 33.8* 32.8* 30.1*  MCV 86.3 86.4 87.3 87.0 87.0  PLT 223 231 218 229 202    Cardiac Enzymes: No results found for this basename: CKTOTAL, CKMB, CKMBINDEX, TROPONINI,  in the last 168 hours  BNP: No components found with this basename: POCBNP,   CBG:  Recent Labs Lab 12/04/13 1532 12/04/13 1926 12/04/13 2335 12/05/13 0347 12/05/13 0715  GLUCAP 137* 125* 60* 107* 33*    Microbiology: Results for orders placed during the hospital encounter of 11/05/13  MRSA PCR SCREENING     Status: None   Collection Time    11/05/13  1:28 PM      Result Value Ref Range Status   MRSA by PCR NEGATIVE  NEGATIVE Final   Comment:            The GeneXpert MRSA Assay (FDA     approved for NASAL specimens     only), is one component of a     comprehensive MRSA colonization     surveillance program. It is not     intended to diagnose MRSA     infection  nor to guide or     monitor treatment for     MRSA infections.  SURGICAL PCR SCREEN     Status: None   Collection Time    11/06/13  8:13 AM      Result Value Ref Range Status   MRSA, PCR NEGATIVE  NEGATIVE Final   Staphylococcus aureus NEGATIVE  NEGATIVE Final   Comment:            The Xpert SA Assay (FDA     approved for NASAL specimens     in patients over 67 years of age),     is one component of     a comprehensive surveillance     program.  Test performance has     been validated by [employer] for patients greater     than or equal to 64 year old.     It is not intended     to diagnose infection nor to     guide or monitor treatment.  CULTURE, BLOOD (SINGLE)     Status: None   Collection Time    11/16/13  9:15 AM      Result Value Ref Range Status    Specimen Description BLOOD LEFT ARM   Final   Special Requests BOTTLES DRAWN AEROBIC ONLY 4CC   Final   Culture  Setup Time     Final   Value: 11/16/2013 15:20     Performed at [facility]   Culture     Final   Value: NO GROWTH 5 DAYS     Performed at [facility]   Report Status 11/22/2013 FINAL   Final  URINE CULTURE     Status: None   Collection Time    11/16/13  9:36 AM      Result Value Ref Range Status   Specimen Description URINE, CATHETERIZED   Final   Special Requests Normal   Final   Culture  Setup Time     Final   Value: 11/16/2013 19:34     Performed at [facility]     Final   Value: NO GROWTH     Performed at [facility]   Culture     Final   Value: NO GROWTH     Performed at [facility]   Report Status 11/17/2013 FINAL   Final  CULTURE, RESPIRATORY (NON-EXPECTORATED)     Status: None   Collection Time    11/21/13  9:00 AM      Result Value Ref Range Status   Specimen Description TRACHEAL ASPIRATE   Final   Special Requests Normal   Final   Gram Stain     Final   Value: MODERATE WBC PRESENT,BOTH PMN AND MONONUCLEAR     RARE SQUAMOUS EPITHELIAL CELLS PRESENT     NO ORGANISMS SEEN     Performed at [facility]   Culture     Final   Value: FEW SERRATIA MARCESCENS     Performed at [facility]   Report Status 11/26/2013 FINAL   Final   Organism ID, Bacteria SERRATIA MARCESCENS   Final  CULTURE, BLOOD (ROUTINE X 2)     Status: None   Collection Time    11/21/13  9:35 AM      Result Value Ref Range Status   Specimen Description BLOOD RIGHT ARM   Final   Special Requests BOTTLES DRAWN AEROBIC ONLY  [facility]   Final   Culture  Setup Time     Final   Value: 11/21/2013 14:44     Performed at [facility]   Culture     Final   Value: SERRATIA MARCESCENS     Note: Gram Stain Report Called to,Read Back By and Verified With: JESSIE SUTTER 11/23/13 AT 0445 RIDK     Performed at [facility]   Report Status 11/26/2013 FINAL   Final   Organism ID, Bacteria SERRATIA MARCESCENS   Final  CULTURE, BLOOD (ROUTINE X 2)     Status: None   Collection Time    11/21/13  9:40 AM      Result Value Ref Range Status   Specimen Description BLOOD RIGHT FOREARM   Final   Special Requests BOTTLES DRAWN AEROBIC ONLY 5CC   Final   Culture  Setup Time     Final   Value: 11/21/2013 14:43     Performed at [facility]   Culture     Final   Value: SERRATIA MARCESCENS     Note: SUSCEPTIBILITIES PERFORMED ON PREVIOUS CULTURE WITHIN THE LAST 5 DAYS.     Note: Gram Stain Report Called to,Read Back By and Verified With: JESSIE SUTTER 11/23/13 AT 28 RIDK     Performed at [facility]   Report Status 11/25/2013 FINAL   Final  URINE CULTURE     Status: None   Collection Time    11/21/13 10:30 AM      Result Value Ref Range Status   Specimen Description URINE, CATHETERIZED   Final   Special Requests has completed 10 days of Ancef Normal   Final   Culture  Setup Time     Final   Value: 11/21/2013 17:18     Performed at [facility]     Final   Value: 9,000 COLONIES/ML     Performed at [facility]   Culture     Final   Value: INSIGNIFICANT GROWTH     Performed at [facility]   Report Status 11/23/2013 FINAL   Final  CLOSTRIDIUM DIFFICILE BY PCR     Status: None   Collection Time    11/21/13  9:00 PM      Result Value Ref Range Status   C difficile by pcr NEGATIVE  NEGATIVE Final  BODY FLUID CULTURE     Status: None   Collection Time    11/22/13 11:02 AM      Result Value Ref Range Status   Specimen Description PLEURAL FLUID RIGHT   Final   Special Requests 60 ML FLUID   Final   Gram Stain     Final   Value: NO WBC SEEN     NO ORGANISMS SEEN     Performed at [facility]   Culture     Final   Value: NO GROWTH 3 DAYS     Performed at [facility]   Report Status 11/26/2013 FINAL   Final  URINE CULTURE      Status: None   Collection Time    12/01/13 11:26 AM      Result Value Ref Range Status   Specimen Description URINE, RANDOM   Final   Special Requests NONE   Final   Culture  Setup Time     Final   Value: 12/01/2013 22:13     Performed at [facility]  Colony Count     Final   Value: 30,000 COLONIES/ML     Performed at [facility]   Culture     Final   Value: Multiple bacterial morphotypes present, none predominant. Suggest appropriate recollection if clinically indicated.     Performed at [facility]   Report Status 12/03/2013 FINAL   Final    Coagulation Studies:  Recent Labs  12/03/13 0337 12/04/13 0500  LABPROT 24.9* 25.1*  INR 2.25* 2.28*    Urinalysis: No results found for this basename: COLORURINE, APPERANCEUR, LABSPEC, PHURINE, GLUCOSEU, HGBUR, BILIRUBINUR, KETONESUR, PROTEINUR, UROBILINOGEN, NITRITE, LEUKOCYTESUR,  in the last 72 hours    Imaging: Ct Head Wo Contrast  12/04/2013   CLINICAL DATA:  Status post cardiac surgery with possible seizure activity. Altered mental status day. Code stroke.  EXAM: CT HEAD WITHOUT CONTRAST  TECHNIQUE: Contiguous axial images were obtained from the base of the skull through the vertex without intravenous contrast.  COMPARISON:  CT head without contrast 12/01/2013.  FINDINGS: An area of hypoattenuation within the inferior left parietal lobe may represent partial volume and along the tentorium. Moderate atrophy and diffuse white matter disease is otherwise stable. No other focal cortical infarct is present. There is no hemorrhage or mass lesion. The ventricles are proportionate to the degree of atrophy.  The paranasal sinuses and mastoid air cells are clear. The osseous skull is intact.  IMPRESSION: 1. Focal hypoattenuation along the inferior left parietal lobe may be partial volume averaging along the tentorium. A developing infarct is also considered. 2. Otherwise stable atrophy and white matter disease. This  likely reflects the sequelae of chronic microvascular ischemia.   Electronically Signed   By: Lawrence Santiago M.D.   On: 12/04/2013 18:06   Dg Chest Port 1 View  12/04/2013   CLINICAL DATA:  Pleural effusion  EXAM: PORTABLE CHEST - 1 VIEW  COMPARISON:  Portable exam 0534 hr compared to 12/03/2013  FINDINGS: Tip of RIGHT jugular central venous catheter projects over high RIGHT atrium, may withdraw 2 cm for positioning at the cavoatrial junction.  Enlargement of cardiac silhouette post coronary stenting, CABG, and MVR.  Mediastinal contours and pulmonary vascularity normal.  Atherosclerotic calcification aortic arch.  Bibasilar atelectasis without pulmonary infiltrate or gross pleural effusion.  No pneumothorax.  Bones unremarkable.  IMPRESSION: Enlargement of cardiac silhouette post CABG, coronary PTCA and MVR.  Bibasilar atelectasis.  Tip of RIGHT jugular central venous catheter projects over high RIGHT atrium as above.   Electronically Signed   By: Lavonia Dana M.D.   On: 12/04/2013 08:11     Medications:   . sodium chloride 10 mL/hr at 11/29/13 0700  . sodium chloride 250 mL (11/21/13 1541)  . sodium chloride 50 mL/hr at 12/05/13 0813   . amiodarone  200 mg Oral Daily  . ceFEPime (MAXIPIME) IV  2 g Intravenous Q24H  . citalopram  20 mg Oral Daily  . darbepoetin (ARANESP) injection - NON-DIALYSIS  100 mcg Subcutaneous Q Wed-1800  . feeding supplement (GLUCERNA SHAKE)  237 mL Oral TID WC  . furosemide  80 mg Oral BID  . insulin aspart  0-24 Units Subcutaneous 6 times per day  . lacosamide (VIMPAT) IV  100 mg Intravenous Q12H  . magic mouthwash  5 mL Oral TID  . neomycin-bacitracin-polymyxin   Topical Daily  . nystatin cream   Topical TID  . pantoprazole  40 mg Oral Daily  . sodium chloride  10-40 mL Intracatheter Q12H  . sodium  chloride  3 mL Intravenous Q12H  . warfarin  2 mg Oral q1800  . Warfarin - Physician Dosing Inpatient   Does not apply q1800   sodium chloride, glucose-Vitamin C,  Influenza vac split quadrivalent PF, levalbuterol, metoprolol, ondansetron (ZOFRAN) IV, promethazine, RESOURCE THICKENUP CLEAR, sodium chloride, sodium chloride, traMADol  Assessment/ Plan:   Renal- Lasix decreased to 80 mg po bid 12/03/13. Appears to be doing well although +ve balance yesterday Acute Encephalopathy- Seizure identified by EEG, started on Vimpat. Some question of metabolic encephalopathy as etiology. Neurology following.  Hypertension/volume- Improved, at baseline. Continue diuretic program as above. Follow daily weights, UOP.  Serratia Bacteremia- Tracheal aspirate and blood cultures 2/2 positive for serratia marcescens. Transitioned from Zosyn to Cefepime on 11/25/13.  Anemia- Situational and CKD- continue weekly aranesp. Iron studies from 9/17 w/ iron of 19, ferritin of 604, Tsat of 7%. No IV Fe given serratia infection. Hb stable.  Hypokalemia- 3.8 this AM. Continue to monitor.  Hyponatremia- Stable and improved  S/p CABG/MVR  AFib on heparin, BB, amio po- In NSR, on po amio   LOS: 30 Verenice Westrich W @TODAY @10 :53 AM

## 2013-12-05 NOTE — Progress Notes (Addendum)
      [address]       [place] [address]             [phone]      27 Days Post-Op Procedure(s) (LRB): CORONARY ARTERY BYPASS GRAFTING (CABG), on pump, times two, using left internal mammary artery, cryo saphenous vein. (N/A) INTRAOPERATIVE TRANSESOPHAGEAL ECHOCARDIOGRAM (N/A) MITRAL VALVE (MV) REPLACEMENT (N/A)  Subjective:  Ms. Wierenga has no complaints.  She states she is feeling okay.  She still continues to not ambulate much and is a max assist to get out of bed.  She is alert and oriented.  Objective: Vital signs in last 24 hours: Temp:  [97.4 F (36.3 C)-98.6 F (37 C)] 98.1 F (36.7 C) (10/01 0717) Pulse Rate:  [65-87] 81 (10/01 0800) Cardiac Rhythm:  [-] Normal sinus rhythm (10/01 0800) Resp:  [11-29] 29 (10/01 0800) BP: (88-120)/(33-62) 108/38 mmHg (10/01 0800) SpO2:  [96 %-100 %] 98 % (10/01 0800) Weight:  [186 lb 4.6 oz (84.5 kg)] 186 lb 4.6 oz (84.5 kg) (10/01 0600)  Intake/Output from previous day: 09/30 0701 - 10/01 0700 In: 1423.3 [P.O.:540; I.V.:763.3; IV Piggyback:120] Out: 680 [Urine:680] Intake/Output this shift: Total I/O In: 80 [I.V.:30; IV Piggyback:50] Out: -   General appearance: alert, cooperative and no distress Heart: regular rate and rhythm Lungs: clear to auscultation bilaterally Abdomen: soft, non-tender; bowel sounds normal; no masses,  no organomegaly Extremities: edema improved, trace to 1+ Wound: clean and dry  Lab Results:  Recent Labs  12/04/13 0500 12/05/13 0335  WBC 6.1 6.2  HGB 10.7* 9.7*  HCT 32.8* 30.1*  PLT 229 202   BMET:  Recent Labs  12/04/13 0500 12/05/13 0335  NA 133* 133*  K 3.8 3.8  CL 92* 92*  CO2 29 27  GLUCOSE 83 103*  BUN 46* 50*  CREATININE 2.75* 3.32*  CALCIUM 8.5 8.4    PT/INR:  Recent Labs  12/04/13 0500  LABPROT 25.1*  INR 2.28*   ABG    Component Value Date/Time   PHART 7.550* 12/01/2013 1302   HCO3 28.1* 12/01/2013 1302   TCO2 29.1 12/01/2013 1302   ACIDBASEDEF 7.0* 11/11/2013 1642   O2SAT 98.0 12/01/2013 1302   CBG (last 3)   Recent Labs  12/04/13 1926 12/04/13 2335 12/05/13 0347  GLUCAP 125* 203* 107*    Assessment/Plan: S/P Procedure(s) (LRB): CORONARY ARTERY BYPASS GRAFTING (CABG), on pump, times two, using left internal mammary artery, cryo saphenous vein. (N/A) INTRAOPERATIVE TRANSESOPHAGEAL ECHOCARDIOGRAM (N/A) MITRAL VALVE (MV) REPLACEMENT (N/A)  1. CV- remains hemodynamically stable, maintaining NSR, off Dopamine, continue Amiodarone 2. Pulm- off oxygen, continue IS 3. Renal- creatinine bumped today, remains hypervolemic, good U/O Nephrology following 4. Neurology- seizures ruled out?, CT scan last night no acute processes present, Neurology following 5. INR- scheduled to be drawn Q48, will assess tomorrow, continue current regimen of Coumadin 6. ID- Serratia Bacteremia-on Cefipime today is Day 9/10 7. Dispo- patient stable, rise in creatinine today good U/O, continue current care   LOS: 30 days    BARRETT, ERIN 12/05/2013  Her multiple medical problems continue to delay rehab from CABG-MVR Seizures now better on medication Still too weak to walk and therefore not safe to transfer to stepdown Creat now rising > 3.0 Maintaining NSR Will continue 14 days of Maxepime due to prosthetic MVR  patient examined and medical record reviewed,agree with above note. VAN TRIGT III,PETER 12/05/2013

## 2013-12-05 NOTE — Progress Notes (Signed)
Physical Therapy Treatment Patient Details Name: Claudia [name] MRN: [id number] DOB: Feb 10, 1932 Today's Date: 12/05/2013    History of Present Illness 80 y.o. female admitted to [facility] on 11/05/13 with chest pain.  She is now s/p emergency CABG x 2 and MVR on 11/09/13.  PMHx includes HTN, MI, DM.  Post-op course complicated by possible seizure activity.  Continuous EEG in progress (12/03/13).     PT Comments    Pt admitted with above. Pt currently with functional limitations due to continued weakness and endurance deficits as well as complications of seizure.  Goals initially set unmet secondary to complications therefore goals revised.  Pt will benefit from skilled PT to increase their independence and safety with mobility to allow discharge to the venue listed below.   Follow Up Recommendations  SNF     Equipment Recommendations  None recommended by PT    Recommendations for Other Services OT consult     Precautions / Restrictions Precautions Precautions: Fall;Sternal Restrictions Other Position/Activity Restrictions: sternal precautions    Mobility  Bed Mobility                  Transfers Overall transfer level: Needs assistance Equipment used: 2 person hand held assist Transfers: Sit to/from Stand Sit to Stand: +2 physical assistance;Mod assist         General transfer comment: Two person mod assist to support trunk over weak legs using pad to assist sit to stand.  Fatigues quickly.    Ambulation/Gait                 Stairs            Wheelchair Mobility    Modified Rankin (Stroke Patients Only)       Balance         Postural control: Posterior lean Standing balance support: Bilateral upper extremity supported Standing balance-Leahy Scale: Poor Standing balance comment: Needs UE support and support of pad at hips to stand.                      Cognition Arousal/Alertness: Awake/alert Behavior During Therapy: WFL for tasks  assessed/performed Overall Cognitive Status: Impaired/Different from baseline Area of Impairment: Attention;Problem solving   Current Attention Level: Sustained         Problem Solving: Slow processing;Decreased initiation;Difficulty sequencing;Requires verbal cues;Requires tactile cues General Comments: At times pt seems present and oriented.  Able to tell me correct month, place, and situation, but at times talking about things that are completely out of context.  She also seems slow to process at times and has times during our session where she seems to zone out and needs to be redirected to task.    Exercises General Exercises - Lower Extremity Ankle Circles/Pumps: AAROM;Both;10 reps;Seated Long Arc Quad: AAROM;Both;10 reps;Seated Hip Flexion/Marching: AAROM;Both;10 reps;Seated    General Comments        Pertinent Vitals/Pain Pain Assessment: No/denies pain VSS    Home Living                      Prior Function            PT Goals (current goals can now be found in the care plan section) Acute Rehab PT Goals PT Goal Formulation: With patient Time For Goal Achievement: 12/19/13 Potential to Achieve Goals: Fair Progress towards PT goals: Goals downgraded-see care plan    Frequency  Min 2X/week    PT Plan Current plan  remains appropriate    Co-evaluation             End of Session Equipment Utilized During Treatment: Gait belt Activity Tolerance: Patient limited by fatigue Patient left: in chair;with call bell/phone within reach     Time: 1103-1120 PT Time Calculation (min): 17 min  Charges:  $Therapeutic Activity: 8-22 mins                    G Codes:      Naomia Lenderman F 12/10/2013, 1:40 PM [employer] Acute Rehabilitation [phone] (pager)

## 2013-12-05 NOTE — Progress Notes (Signed)
Ms. Minkler family is wanting an update about her condition and results from EEG / CT, and would like to hear from an MD.  Joline Maxcy (Son) is primary caregiver: [phone]

## 2013-12-05 NOTE — Progress Notes (Signed)
Upon assessment, patient found to have foul vaginal odor. Presence of yeast previously noted. While peri care being performed, foley fell out. Balloon was mostly deflated with only 4 cc in balloon. Peri care performed again and a new foley was inserted following sterile technique. Will continue to monitor pt.

## 2013-12-05 NOTE — Clinical Social Work Note (Signed)
CSW continues to follow for support and to assist with dc planning-  Appropriate dc plan is still undetermined due to medical state.   Eduard Clos, MSW, [place]

## 2013-12-05 NOTE — Progress Notes (Addendum)
STROKE TEAM PROGRESS NOTE   HISTORY Claudia [name] is an 80 y.o. 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 Has had a complicated recovery. Today when family entered the room the patient was breathing fast and loud. Patient was also unable to recognize family members. Nursing was contacted at that time and code stroke was called.  Patient is on Coumadin with an INR of 2.16.  Date last known well: Date: 12/01/2013  Time last known well: Time: 09:30  tPA Given: No: On Coumadin with an elevated INR   SUBJECTIVE (INTERVAL HISTORY) Her RN and her son are at the bedside. She is lethargic today, not as awake alert as yesterday. Her Cre elevated more and her INR therapeutic.  OBJECTIVE Temp:  [97.4 F (36.3 C)-98.6 F (37 C)] 98.2 F (36.8 C) (10/01 1138) Pulse Rate:  [65-85] 83 (10/01 1300) Cardiac Rhythm:  [-] Normal sinus rhythm (10/01 1200) Resp:  [11-29] 13 (10/01 1300) BP: (93-120)/(33-62) 117/40 mmHg (10/01 1300) SpO2:  [95 %-100 %] 97 % (10/01 1300) Weight:  [186 lb 4.6 oz (84.5 kg)] 186 lb 4.6 oz (84.5 kg) (10/01 0600)   Recent Labs Lab 12/04/13 1926 12/04/13 2335 12/05/13 0347 12/05/13 0715 12/05/13 1136  GLUCAP 125* 203* 107* 137* 156*    Recent Labs Lab 12/01/13 0339 12/02/13 0416 12/03/13 0337 12/04/13 0500 12/05/13 0335  NA 127* 129* 130* 133* 133*  K 4.0 3.5* 3.9 3.8 3.8  CL 82* 85* 88* 92* 92*  CO2 31 30 30 29 27   GLUCOSE 120* 152* 134* 83 103*  BUN 52* 46* 44* 46* 50*  CREATININE 2.83* 2.62* 2.64* 2.75* 3.32*  CALCIUM 8.8 8.6 8.7 8.5 8.4    Recent Labs Lab 12/03/13 0337  AST 12  ALT 10  ALKPHOS 69  BILITOT 0.4  PROT 6.3  ALBUMIN 2.5*    Recent Labs Lab 12/01/13 0339 12/02/13 0416 12/03/13 0337 12/04/13 0500 12/05/13 0335  WBC 7.8 6.9 6.9 6.1 6.2  HGB 10.9* 10.8* 10.8* 10.7* 9.7*  HCT 33.5* 33.6* 33.8* 32.8* 30.1*  MCV 86.3 86.4 87.3 87.0 87.0  PLT 223 231 218 229 202   No results found for this basename: CKTOTAL, CKMB,  CKMBINDEX, TROPONINI,  in the last 168 hours  Recent Labs  12/03/13 0337 12/04/13 0500  LABPROT 24.9* 25.1*  INR 2.25* 2.28*   No results found for this basename: COLORURINE, APPERANCEUR, LABSPEC, PHURINE, GLUCOSEU, HGBUR, BILIRUBINUR, KETONESUR, PROTEINUR, UROBILINOGEN, NITRITE, LEUKOCYTESUR,  in the last 72 hours     Component Value Date/Time   CHOL 125 12/03/2012 0355   TRIG 42 12/03/2012 0355   HDL 72 12/03/2012 0355   CHOLHDL 1.7 12/03/2012 0355   VLDL 8 12/03/2012 0355   LDLCALC 45 12/03/2012 0355   Lab Results  Component Value Date   HGBA1C 7.2* 11/06/2013   No results found for this basename: labopia,  cocainscrnur,  labbenz,  amphetmu,  thcu,  labbarb    No results found for this basename: ETH,  in the last 168 hours  Ct Head Wo Contrast  12/01/2013   IMPRESSION: 1. Atrophy and small vessel disease. 2.  No evidence for acute intracranial abnormality.      12/04/13 1. Focal hypoattenuation along the inferior left parietal lobe may be partial volume averaging along the tentorium. A developing infarct is also considered.  2. Otherwise stable atrophy and white matter disease. This likely reflects the sequelae of chronic microvascular ischemia.   Dg Chest Port 1 View  12/02/2013  IMPRESSION: Stable appearance of the chest when compare with the prior exam.      12/01/2013   IMPRESSION: Enlargement of cardiac silhouette post MVR.  Bibasilar atelectasis with additional atelectasis or scarring in RIGHT upper lobe.     CUS - 11/05/13 - Bilateral: 1-39% ICA stenosis. Vertebral artery flow is antegrade.  2D echo - 11/05/13 - - Left ventricle: The cavity size was normal. There was mild concentric hypertrophy. Systolic function was normal. The estimated ejection fraction was in the range of 55% to 60%. Hypokinesis of the basal-midinferolateral and inferior myocardium. Features are consistent with a pseudonormal left ventricular filling pattern, with concomitant abnormal relaxation and  increased filling pressure (grade 2 diastolic dysfunction). Doppler parameters are consistent with both elevated ventricular end-diastolic filling pressure and elevated left atrial filling pressure. - Aortic valve: There was trivial regurgitation. - Mitral valve: Consider papillar muscle dysfunction with wall motion abnormality. Normal-sized, mildly to moderately calcified annulus. Moderately thickened, mildly calcified leaflets posterior. Mobility of the posterior leaflet was mildly restricted. There was moderate to severe regurgitation directed posteriorly. - Left atrium: The atrium was moderately dilated. - Atrial septum: The septum bowed from left to right, consistent with increased left atrial pressure.  LTM EEG 9/28-9/29: Triphasic activity is consistent with a moderate diffuse encephalopathy, consistent with the moderate diffuse slowing in the fragmented slow posterior rhythm; it further suggests a metabolic etiology. As noted in the report below there some aspects of the recording that raise a concern for nonconvulsive status epilepticus. However, this generalized triphasic activity tends to vary with state and disappears during sleep, which argues against nonconvulsive seizure. Further, after the prolonged periods of periodic discharges, there are no obvious focal regions of postictal slowing, as might be expected after prolonged focal seizure activity. Overall, most consistent with moderate diffuse encephalopathy.  LTM EEG 9/29-9/30 - Impression: This is an abnormal 24 hr continuous video-EEG  recording. Significant findings include: 1. Diffuse slowing, low voltage polymorphic theta/delta, even when fully alert 2. Generalized triphasic activity, poorly formed and in short runs only 3. Bilateral central/parietal sharp-wave complexes,  independently over the left and right hemispheres, isolated and in short runs 4. Slow fragmented posterior rhythm, 6-7 Hz Clinical Correlation: The  findings indicate a moderate diffuse  encephalopathy, that appears to be improving. The findings do not  specify and etiology, but would be consistent with a  toxic/metabolic derangement. Also remaining are indications of  independent bihemipsheric foci of cortical irritability with  epileptogenic potential; no ongoing seizure was observed however.  Findings discussed with the ICU Attending.  Spot EEG - pending  PHYSICAL EXAM  Temp:  [97.4 F (36.3 C)-98.6 F (37 C)] 98.2 F (36.8 C) (10/01 1138) Pulse Rate:  [65-85] 83 (10/01 1300) Resp:  [11-29] 13 (10/01 1300) BP: (93-120)/(33-62) 117/40 mmHg (10/01 1300) SpO2:  [95 %-100 %] 97 % (10/01 1300) Weight:  [186 lb 4.6 oz (84.5 kg)] 186 lb 4.6 oz (84.5 kg) (10/01 0600)  General - Well nourished, well developed, more lethargic.  Ophthalmologic - not able to see through.  Cardiovascular - Regular rate and rhythm with no murmur.  Mental Status -  Level of arousal and orientation to time, place, and person were intact, however slow in response and perseveration present. Lethargic not able to answer simple questions with brief answer.  Cranial Nerves II - XII - II - Visual field intact OU, blinking to visual threat bilaterally. III, IV, VI - Extraocular movements intact. V - Facial sensation intact bilaterally. VII -  Facial movement intact bilaterally. VIII - Hearing & vestibular intact bilaterally. X - Palate elevates symmetrically. XI - Chin turning & shoulder shrug intact bilaterally. XII - Tongue protrusion intact.  Motor Strength - The patient's strength was 4/5 BUEs and 4/5 LLE but 4/5 RLE and pronator drift was absent.  Bulk was normal and fasciculations were absent.  Again pronounced b/l arm asterixis, consistent with metabolic encephalopathy. Motor Tone - Muscle tone was assessed at the neck and appendages and was normal.  Reflexes - The patient's reflexes were 1+ in all extremities and she had no pathological  reflexes.  Sensory - Light touch, temperature/pinprick were assessed and were symmetrical.    Coordination - The patient had normal movements in the hands with no ataxia or dysmetria.  Tremor was absent but BUE asterixis.  Gait and Station - not tested.   ASSESSMENT/PLAN  Ms. Claudia [name] is a 80 y.o. female with history of DM, HTN, HLD, s/p CABG and MVR on 11/08/13 was consulted for AMS and mutism as well as breathing difficulty. She is on coumadin and INR so far therapeutic. The presentation is not typical for stroke especially no focal neuro exam. EEG largely metabolic encephalopathy but did raise concern of NCSE, vimpat loaded and maintain does 50 mg bid. Overnight concern for seizure, vimpat up to 100mg  bid. However, LTM EEG showed more likely metabolic encephalopathy. CT repeat did not support stroke. Pt more lethargic today with more pronounced b/l asterixis, and her Cre trending up from 2.7 to 3.3, still consistent with metabolic encephalopathy. Will do spot EEG.  Metabolic encephalopathy vs. Seizure vs. TIA    Pt condition more consistent with metabolic encephalopathy, contributing factors include b/l asterixis, renal failure, hyponatremia, and respiratory alkalosis.  Not sure why pt is on coumadin. Although EEG concerning for seizure, but largely it showed metabolic encephalopathy.   MRI  And MRA not able to perform due to recent cardiac procedure. Carotid Doppler  Bilateral: 1-39% ICA stenosis. Vertebral artery flow is antegrade.  2D Echo 55-60%  warfarin prior to admission, now on warfarin, therapeutic INR 2.25 -> 2.28  CT repeat showed hypodensity at left occipital-cerebellar area, most likely the sulcus along the tentorium, less likely infarction as it does not fit to the vascular distribution. Imaging discussed with Dr. Zenia Resides in Radiology.  LDL 45, no statin necessary as meeting goal of LDL <100  HgbA1c 7.2, elevated.  Warfarin for VTE prophylaxis  Seizure - initially  EEG concerning for NCSE, but more consistent with encephalopathy - continue vimpat 100mg  bid - LTM EEG no seizure but triphasic waves. - LTM EEG d/c'ed yesterday. - will do spot EEG today  Metabolic encephalopathy - worse today  - more lethargic and slow in react.  - more asterixis seen today - Cre trending up to 3.3, likely the cause of worsening mental status - Na stable at 133 - ammonia level WNL - pt has been on cefepime for a while, which is known may cause encephalopathy. Recommend to change to rocephine according to drug sensitivity.  Hypertension   Permissive hypertension <220/120 for 24-48 hours and then gradually normalize within 5-7 days  BP goal long term normotensive  Stable  Patient counseled to be compliant with her blood pressure medications  Hyperlipidemia  Home meds:  None. Will not start hospital.  LDL 45, goal < 100 (<70 for diabetics)  Continue statin at discharge  Diabetes  HgbA1c 7.2, Goal < 7.0  Uncontrolled  SSI   Educated patient about lifestyle  changes for diabetes treatment prevention  Other Stroke Risk Factors - s/p CABG and MVR - acute renal failure  Hospital day # 30  Rosalin Hawking, MD PhD Stroke Neurology 12/05/2013 2:30 PM    To contact Stroke Continuity provider, please refer to [email or web address] After hours, contact General Neurology

## 2013-12-06 ENCOUNTER — Inpatient Hospital Stay (HOSPITAL_COMMUNITY): Payer: Medicare HMO

## 2013-12-06 DIAGNOSIS — R4701 Aphasia: Secondary | ICD-10-CM

## 2013-12-06 DIAGNOSIS — I25110 Atherosclerotic heart disease of native coronary artery with unstable angina pectoris: Secondary | ICD-10-CM

## 2013-12-06 DIAGNOSIS — R410 Disorientation, unspecified: Secondary | ICD-10-CM

## 2013-12-06 LAB — CARBOXYHEMOGLOBIN
Carboxyhemoglobin: 2.3 % — ABNORMAL HIGH (ref 0.5–1.5)
Methemoglobin: 0.4 % (ref 0.0–1.5)
O2 Saturation: 74.5 %
Total hemoglobin: 10.5 g/dL — ABNORMAL LOW (ref 12.0–16.0)

## 2013-12-06 LAB — PROTIME-INR
INR: 1.91 — ABNORMAL HIGH (ref 0.00–1.49)
Prothrombin Time: 21.9 seconds — ABNORMAL HIGH (ref 11.6–15.2)

## 2013-12-06 LAB — COMPREHENSIVE METABOLIC PANEL
ALK PHOS: 77 U/L (ref 39–117)
ALT: 11 U/L (ref 0–35)
ANION GAP: 17 — AB (ref 5–15)
AST: 15 U/L (ref 0–37)
Albumin: 2.8 g/dL — ABNORMAL LOW (ref 3.5–5.2)
BILIRUBIN TOTAL: 0.5 mg/dL (ref 0.3–1.2)
BUN: 50 mg/dL — AB (ref 6–23)
CO2: 24 meq/L (ref 19–32)
CREATININE: 3.5 mg/dL — AB (ref 0.50–1.10)
Calcium: 9 mg/dL (ref 8.4–10.5)
Chloride: 94 mEq/L — ABNORMAL LOW (ref 96–112)
GFR calc Af Amer: 13 mL/min — ABNORMAL LOW (ref >90)
GFR, EST NON AFRICAN AMERICAN: 11 mL/min — AB (ref >90)
GLUCOSE: 149 mg/dL — AB (ref 70–99)
POTASSIUM: 4.1 meq/L (ref 3.7–5.3)
Sodium: 135 mEq/L — ABNORMAL LOW (ref 137–147)
Total Protein: 6.4 g/dL (ref 6.0–8.3)

## 2013-12-06 LAB — CBC
HCT: 33.6 % — ABNORMAL LOW (ref 36.0–46.0)
Hemoglobin: 10.9 g/dL — ABNORMAL LOW (ref 12.0–15.0)
MCH: 28.2 pg (ref 26.0–34.0)
MCHC: 32.4 g/dL (ref 30.0–36.0)
MCV: 87 fL (ref 78.0–100.0)
Platelets: 219 10*3/uL (ref 150–400)
RBC: 3.86 MIL/uL — ABNORMAL LOW (ref 3.87–5.11)
RDW: 15.8 % — ABNORMAL HIGH (ref 11.5–15.5)
WBC: 5.5 10*3/uL (ref 4.0–10.5)

## 2013-12-06 LAB — BASIC METABOLIC PANEL
Anion gap: 14 (ref 5–15)
BUN: 51 mg/dL — ABNORMAL HIGH (ref 6–23)
CO2: 27 mEq/L (ref 19–32)
Calcium: 8.8 mg/dL (ref 8.4–10.5)
Chloride: 93 mEq/L — ABNORMAL LOW (ref 96–112)
Creatinine, Ser: 3.55 mg/dL — ABNORMAL HIGH (ref 0.50–1.10)
GFR calc Af Amer: 13 mL/min — ABNORMAL LOW (ref >90)
GFR calc non Af Amer: 11 mL/min — ABNORMAL LOW (ref >90)
Glucose, Bld: 127 mg/dL — ABNORMAL HIGH (ref 70–99)
Potassium: 4 mEq/L (ref 3.7–5.3)
Sodium: 134 mEq/L — ABNORMAL LOW (ref 137–147)

## 2013-12-06 LAB — GLUCOSE, CAPILLARY
GLUCOSE-CAPILLARY: 134 mg/dL — AB (ref 70–99)
GLUCOSE-CAPILLARY: 201 mg/dL — AB (ref 70–99)
Glucose-Capillary: 123 mg/dL — ABNORMAL HIGH (ref 70–99)
Glucose-Capillary: 176 mg/dL — ABNORMAL HIGH (ref 70–99)
Glucose-Capillary: 101 mg/dL — ABNORMAL HIGH (ref 70–99)

## 2013-12-06 LAB — POCT I-STAT, CHEM 8
BUN: 44 mg/dL — AB (ref 6–23)
CALCIUM ION: 1.16 mmol/L (ref 1.13–1.30)
CHLORIDE: 97 meq/L (ref 96–112)
Creatinine, Ser: 3.5 mg/dL — ABNORMAL HIGH (ref 0.50–1.10)
Glucose, Bld: 151 mg/dL — ABNORMAL HIGH (ref 70–99)
HCT: 35 % — ABNORMAL LOW (ref 36.0–46.0)
Hemoglobin: 11.9 g/dL — ABNORMAL LOW (ref 12.0–15.0)
Potassium: 3.8 mEq/L (ref 3.7–5.3)
Sodium: 133 mEq/L — ABNORMAL LOW (ref 137–147)
TCO2: 25 mmol/L (ref 0–100)

## 2013-12-06 LAB — POCT I-STAT 3, ART BLOOD GAS (G3+)
ACID-BASE EXCESS: 1 mmol/L (ref 0.0–2.0)
Bicarbonate: 25.3 mEq/L — ABNORMAL HIGH (ref 20.0–24.0)
O2 SAT: 96 %
PCO2 ART: 35.5 mmHg (ref 35.0–45.0)
TCO2: 26 mmol/L (ref 0–100)
pH, Arterial: 7.459 — ABNORMAL HIGH (ref 7.350–7.450)
pO2, Arterial: 74 mmHg — ABNORMAL LOW (ref 80.0–100.0)

## 2013-12-06 LAB — LACTIC ACID, PLASMA: Lactic Acid, Venous: 0.7 mmol/L (ref 0.5–2.2)

## 2013-12-06 LAB — AMMONIA: Ammonia: 22 umol/L (ref 11–60)

## 2013-12-06 MED ORDER — LORAZEPAM 2 MG/ML IJ SOLN
INTRAMUSCULAR | Status: AC
  Filled 2013-12-06: qty 1

## 2013-12-06 MED ORDER — FUROSEMIDE 10 MG/ML IJ SOLN
40.0000 mg | Freq: Two times a day (BID) | INTRAMUSCULAR | Status: DC
  Administered 2013-12-06 – 2013-12-09 (×6): 40 mg via INTRAVENOUS
  Filled 2013-12-06 (×9): qty 4

## 2013-12-06 MED ORDER — DEXTROSE 5 % IV SOLN
2.0000 g | INTRAVENOUS | Status: DC
  Administered 2013-12-06: 2 g via INTRAVENOUS
  Filled 2013-12-06 (×2): qty 2

## 2013-12-06 MED ORDER — LORAZEPAM 2 MG/ML IJ SOLN
2.0000 mg | INTRAMUSCULAR | Status: AC
  Administered 2013-12-06: 2 mg via INTRAVENOUS

## 2013-12-06 NOTE — Progress Notes (Signed)
[facility] ROUNDING NOTE   Subjective:   Interval History: looks much better  Objective:  Vital signs in last 24 hours:  Temp:  [97.5 F (36.4 C)-99 F (37.2 C)] 97.5 F (36.4 C) (10/02 1123) Pulse Rate:  [79-95] 86 (10/02 0800) Resp:  [9-36] 24 (10/02 0800) BP: (117-153)/(40-66) 136/54 mmHg (10/02 0800) SpO2:  [93 %-99 %] 94 % (10/02 0800) Weight:  [85.2 kg (187 lb 13.3 oz)] 85.2 kg (187 lb 13.3 oz) (10/02 0300)  Weight change: 0.7 kg (1 lb 8.7 oz) Filed Weights   12/04/13 0600 12/05/13 0600 12/06/13 0300  Weight: 89.268 kg (196 lb 12.8 oz) 84.5 kg (186 lb 4.6 oz) 85.2 kg (187 lb 13.3 oz)    Intake/Output: I/O last 3 completed shifts: In: 2453.8 [P.O.:195; I.V.:1853.8; IV Piggyback:405] Out: 3220 [Urine:1725]   Intake/Output this shift:  Total I/O In: 54.1 [I.V.:54.1] Out: -   General: Obese female, alert, cooperative, NAD. On 24 hour EEG.  HEENT: PERRL, EOMI. Moist mucus membranes Neck: Full range of motion without pain, supple, no lymphadenopathy or carotid bruits. RIJ CVL.  Lungs: Clear to ascultation bilaterally, normal work of respiration, no wheezes, rales, rhonchi.  Heart: RRR, no murmurs, gallops, or rubs Abdomen: Soft, non-tender, non-distended, BS +  Extremities: +Trace pitting edema.  Neurologic: Alert & oriented x3, cranial nerves II-XII intact, strength grossly intact, sensation intact to light     Basic Metabolic Panel:  Recent Labs Lab 12/03/13 0337 12/04/13 0500 12/05/13 0335 12/06/13 0425 12/06/13 0909  NA 130* 133* 133* 134* 135*  K 3.9 3.8 3.8 4.0 4.1  CL 88* 92* 92* 93* 94*  CO2 30 29 27 27 24   GLUCOSE 134* 83 103* 127* 149*  BUN 44* 46* 50* 51* 50*  CREATININE 2.64* 2.75* 3.32* 3.55* 3.50*  CALCIUM 8.7 8.5 8.4 8.8 9.0    Liver Function Tests:  Recent Labs Lab 12/03/13 0337 12/06/13 0909  AST 12 15  ALT 10 11  ALKPHOS 69 77  BILITOT 0.4 0.5  PROT 6.3 6.4  ALBUMIN 2.5* 2.8*   No results found for this  basename: LIPASE, AMYLASE,  in the last 168 hours  Recent Labs Lab 12/04/13 0500 12/06/13 0928  AMMONIA 22 22    CBC:  Recent Labs Lab 12/02/13 0416 12/03/13 0337 12/04/13 0500 12/05/13 0335 12/06/13 0425  WBC 6.9 6.9 6.1 6.2 5.5  HGB 10.8* 10.8* 10.7* 9.7* 10.9*  HCT 33.6* 33.8* 32.8* 30.1* 33.6*  MCV 86.4 87.3 87.0 87.0 87.0  PLT 231 218 229 202 219    Cardiac Enzymes: No results found for this basename: CKTOTAL, CKMB, CKMBINDEX, TROPONINI,  in the last 168 hours  BNP: No components found with this basename: POCBNP,   CBG:  Recent Labs Lab 12/05/13 1623 12/05/13 1947 12/05/13 2347 12/06/13 0351 12/06/13 0711  GLUCAP 243* 237* 171* 134* 123*    Microbiology: Results for orders placed during the hospital encounter of 11/05/13  MRSA PCR SCREENING     Status: None   Collection Time    11/05/13  1:28 PM      Result Value Ref Range Status   MRSA by PCR NEGATIVE  NEGATIVE Final   Comment:            The GeneXpert MRSA Assay (FDA     approved for NASAL specimens     only), is one component of a     comprehensive MRSA colonization     surveillance program. It is not  intended to diagnose MRSA     infection nor to guide or     monitor treatment for     MRSA infections.  SURGICAL PCR SCREEN     Status: None   Collection Time    11/06/13  8:13 AM      Result Value Ref Range Status   MRSA, PCR NEGATIVE  NEGATIVE Final   Staphylococcus aureus NEGATIVE  NEGATIVE Final   Comment:            The Xpert SA Assay (FDA     approved for NASAL specimens     in patients over 46 years of age),     is one component of     a comprehensive surveillance     program.  Test performance has     been validated by [employer] for patients greater     than or equal to 62 year old.     It is not intended     to diagnose infection nor to     guide or monitor treatment.  CULTURE, BLOOD (SINGLE)     Status: None   Collection Time    11/16/13  9:15 AM      Result  Value Ref Range Status   Specimen Description BLOOD LEFT ARM   Final   Special Requests BOTTLES DRAWN AEROBIC ONLY 4CC   Final   Culture  Setup Time     Final   Value: 11/16/2013 15:20     Performed at [facility]   Culture     Final   Value: NO GROWTH 5 DAYS     Performed at [facility]   Report Status 11/22/2013 FINAL   Final  URINE CULTURE     Status: None   Collection Time    11/16/13  9:36 AM      Result Value Ref Range Status   Specimen Description URINE, CATHETERIZED   Final   Special Requests Normal   Final   Culture  Setup Time     Final   Value: 11/16/2013 19:34     Performed at [place]     Final   Value: NO GROWTH     Performed at [facility]   Culture     Final   Value: NO GROWTH     Performed at [facility]   Report Status 11/17/2013 FINAL   Final  CULTURE, RESPIRATORY (NON-EXPECTORATED)     Status: None   Collection Time    11/21/13  9:00 AM      Result Value Ref Range Status   Specimen Description TRACHEAL ASPIRATE   Final   Special Requests Normal   Final   Gram Stain     Final   Value: MODERATE WBC PRESENT,BOTH PMN AND MONONUCLEAR     RARE SQUAMOUS EPITHELIAL CELLS PRESENT     NO ORGANISMS SEEN     Performed at [facility]   Culture     Final   Value: FEW SERRATIA MARCESCENS     Performed at [facility]   Report Status 11/26/2013 FINAL   Final   Organism ID, Bacteria SERRATIA MARCESCENS   Final  CULTURE, BLOOD (ROUTINE X 2)     Status: None   Collection Time    11/21/13  9:35 AM      Result Value Ref Range Status   Specimen Description BLOOD RIGHT ARM  Final   Special Requests BOTTLES DRAWN AEROBIC ONLY 5CC   Final   Culture  Setup Time     Final   Value: 11/21/2013 14:44     Performed at [facility]   Culture     Final   Value: SERRATIA MARCESCENS     Note: Gram Stain Report Called to,Read Back By and Verified With: JESSIE SUTTER 11/23/13 AT 0445 RIDK      Performed at [facility]   Report Status 11/26/2013 FINAL   Final   Organism ID, Bacteria SERRATIA MARCESCENS   Final  CULTURE, BLOOD (ROUTINE X 2)     Status: None   Collection Time    11/21/13  9:40 AM      Result Value Ref Range Status   Specimen Description BLOOD RIGHT FOREARM   Final   Special Requests BOTTLES DRAWN AEROBIC ONLY 5CC   Final   Culture  Setup Time     Final   Value: 11/21/2013 14:43     Performed at [facility]   Culture     Final   Value: SERRATIA MARCESCENS     Note: SUSCEPTIBILITIES PERFORMED ON PREVIOUS CULTURE WITHIN THE LAST 5 DAYS.     Note: Gram Stain Report Called to,Read Back By and Verified With: JESSIE SUTTER 11/23/13 AT 79 RIDK     Performed at [facility]   Report Status 11/25/2013 FINAL   Final  URINE CULTURE     Status: None   Collection Time    11/21/13 10:30 AM      Result Value Ref Range Status   Specimen Description URINE, CATHETERIZED   Final   Special Requests has completed 10 days of Ancef Normal   Final   Culture  Setup Time     Final   Value: 11/21/2013 17:18     Performed at [place]     Final   Value: 9,000 COLONIES/ML     Performed at [facility]   Culture     Final   Value: INSIGNIFICANT GROWTH     Performed at [facility]   Report Status 11/23/2013 FINAL   Final  CLOSTRIDIUM DIFFICILE BY PCR     Status: None   Collection Time    11/21/13  9:00 PM      Result Value Ref Range Status   C difficile by pcr NEGATIVE  NEGATIVE Final  BODY FLUID CULTURE     Status: None   Collection Time    11/22/13 11:02 AM      Result Value Ref Range Status   Specimen Description PLEURAL FLUID RIGHT   Final   Special Requests 60 ML FLUID   Final   Gram Stain     Final   Value: NO WBC SEEN     NO ORGANISMS SEEN     Performed at [facility]   Culture     Final   Value: NO GROWTH 3 DAYS     Performed at [facility]   Report Status 11/26/2013 FINAL    Final  URINE CULTURE     Status: None   Collection Time    12/01/13 11:26 AM      Result Value Ref Range Status   Specimen Description URINE, RANDOM   Final   Special Requests NONE   Final   Culture  Setup Time     Final   Value: 12/01/2013 22:13  Performed at [place]     Final   Value: 30,000 COLONIES/ML     Performed at [facility]   Culture     Final   Value: Multiple bacterial morphotypes present, none predominant. Suggest appropriate recollection if clinically indicated.     Performed at [facility]   Report Status 12/03/2013 FINAL   Final  WOUND CULTURE     Status: None   Collection Time    12/05/13  7:41 PM      Result Value Ref Range Status   Specimen Description WOUND   Final   Special Requests IV SITE RIGHT SUBCLAVIAN   Final   Gram Stain     Final   Value: NO WBC SEEN     NO SQUAMOUS EPITHELIAL CELLS SEEN     NO ORGANISMS SEEN     Performed at [facility]   Culture     Final   Value: NO GROWTH     Performed at [facility]   Report Status PENDING   Incomplete    Coagulation Studies:  Recent Labs  12/04/13 0500 12/06/13 0425  LABPROT 25.1* 21.9*  INR 2.28* 1.91*    Urinalysis: No results found for this basename: COLORURINE, APPERANCEUR, LABSPEC, PHURINE, GLUCOSEU, HGBUR, BILIRUBINUR, KETONESUR, PROTEINUR, UROBILINOGEN, NITRITE, LEUKOCYTESUR,  in the last 72 hours    Imaging: Ct Head Wo Contrast  12/06/2013   CLINICAL DATA:  Mental status change. Subsequent encounter for mental status changes beginning yesterday.  EXAM: CT HEAD WITHOUT CONTRAST  TECHNIQUE: Contiguous axial images were obtained from the base of the skull through the vertex without intravenous contrast.  COMPARISON:  CT head without contrast 12/04/2013.  FINDINGS: A focal area of hypoattenuation versus volume-averaging along the inferior aspect of the left occipital lobe is stable. There is no hemorrhage or mass lesion. The  basal ganglia are intact. There is diffuse decreased gray-white differentiation along the cortex over the convexities bilaterally compared to the prior studies  The paranasal sinuses and mastoid air cells are clear. Atherosclerotic calcifications are again noted.  IMPRESSION: 1. Progressive diffuse loss of gray-white differentiation over the cerebral convexities bilaterally. This raises concern for diffuse hypoxia. If the patient remains unresponsive, nuclear medicine cerebral fusion exam may be useful for further evaluation. 2. The basal ganglia are preserved. 3. No evidence for hemorrhage. 4. Hypoattenuation on the left likely represents partial volume averaging versus infarct along the inferior parietal lobe.   Electronically Signed   By: Lawrence Santiago M.D.   On: 12/06/2013 11:31   Ct Head Wo Contrast  12/04/2013   CLINICAL DATA:  Status post cardiac surgery with possible seizure activity. Altered mental status day. Code stroke.  EXAM: CT HEAD WITHOUT CONTRAST  TECHNIQUE: Contiguous axial images were obtained from the base of the skull through the vertex without intravenous contrast.  COMPARISON:  CT head without contrast 12/01/2013.  FINDINGS: An area of hypoattenuation within the inferior left parietal lobe may represent partial volume and along the tentorium. Moderate atrophy and diffuse white matter disease is otherwise stable. No other focal cortical infarct is present. There is no hemorrhage or mass lesion. The ventricles are proportionate to the degree of atrophy.  The paranasal sinuses and mastoid air cells are clear. The osseous skull is intact.  IMPRESSION: 1. Focal hypoattenuation along the inferior left parietal lobe may be partial volume averaging along the tentorium. A developing infarct is also considered. 2. Otherwise stable atrophy and  white matter disease. This likely reflects the sequelae of chronic microvascular ischemia.   Electronically Signed   By: Lawrence Santiago M.D.   On: 12/04/2013  18:06   Dg Chest Port 1 View  12/06/2013   CLINICAL DATA:  Chest pain following mitral valve replacement and coronary bypass grafting  EXAM: PORTABLE CHEST - 1 VIEW  COMPARISON:  12/04/2013  FINDINGS: Cardiac shadow is stable. There are changes consistent with prior mitral valve replacement, coronary bypass grafting and coronary artery stenting. Bibasilar atelectatic changes are again identified. The overall appearance is stable from the prior exam. No new focal abnormality is seen. The right central venous line is again noted in the mid right atrium.  IMPRESSION: When compared with the prior exam, the overall appearance is stable with bibasilar atelectatic changes.   Electronically Signed   By: Inez Catalina M.D.   On: 12/06/2013 08:13     Medications:   . sodium chloride 10 mL/hr at 11/29/13 0700  . sodium chloride 250 mL (11/21/13 1541)  . sodium chloride 50 mL/hr at 12/06/13 0800  . DOPamine 2.5 mcg/kg/min (12/06/13 0800)   . amiodarone  200 mg Oral Daily  . cefTRIAXone (ROCEPHIN)  IV  2 g Intravenous Q24H  . citalopram  20 mg Oral Daily  . darbepoetin (ARANESP) injection - NON-DIALYSIS  100 mcg Subcutaneous Q Wed-1800  . feeding supplement (GLUCERNA SHAKE)  237 mL Oral TID WC  . furosemide  80 mg Oral BID  . insulin aspart  0-24 Units Subcutaneous 6 times per day  . lacosamide (VIMPAT) IV  100 mg Intravenous Q12H  . magic mouthwash  5 mL Oral TID  . neomycin-bacitracin-polymyxin   Topical Daily  . nystatin cream   Topical TID  . pantoprazole  40 mg Oral Daily  . sodium chloride  10-40 mL Intracatheter Q12H  . sodium chloride  3 mL Intravenous Q12H  . warfarin  2 mg Oral q1800  . Warfarin - Physician Dosing Inpatient   Does not apply q1800   sodium chloride, glucose-Vitamin C, Influenza vac split quadrivalent PF, levalbuterol, metoprolol, ondansetron (ZOFRAN) IV, promethazine, RESOURCE THICKENUP CLEAR, sodium chloride, sodium chloride, traMADol  Assessment/ Plan:  Renal- Lasix  decreased to 80 mg po bid 12/03/13, will decrease lasix to 40mg  BID Acute Encephalopathy- Seizure identified by EEG, started on Vimpat. Some question of metabolic encephalopathy as etiology. Neurology following.  Hypertension/volume- Improved, at baseline. Continue diuretic program as above. Follow daily weights, UOP.  Serratia Bacteremia-Cefepime on 11/25/13.  Anemia- Hb 10.7  Hypokalemia- 3.8 this AM. Continue to monitor.  Hyponatremia- Stable and improved  S/p CABG/MVR  AFib on heparin, BB, amio po- In NSR, on po amio     LOS: 31 Lanisa Ishler W @TODAY @12 :06 PM

## 2013-12-06 NOTE — Procedures (Addendum)
History: 80 year old female with altered mental status  Sedation: None  Technique: This is a 17 channel routine scalp EEG performed at the bedside with bipolar and monopolar montages arranged in accordance to the international 10/20 system of electrode placement. One channel was dedicated to EKG recording.    Background: At onset of the recording there are frequent 2-2.5 Hz triphasic waves that are bifrontally predominant with an anterior posterior lag. There is no clear clinical correlate seen on the video. This pattern continues for approximately half of the recording and after Ativan administration, this pattern is replaced with low amplitude intermixed alpha and beta activities.  Photic stimulation: Physiologic driving is not performed  EEG Abnormalities: 1) frequent rhythmic frontally predominant triphasic waves 2) cessation of the above activity following Ativan administration  Clinical Interpretation: This EEG is abnormal, with frequent rhythmic activity occurring at a concerning frequency. There is no clear evolution to this activity, and the characteristic triphasic morphology anterior posterior lag I think would favor this representing encephalopathic pattern. The cessation with Ativan was not accompanied by clinical improvement and therefore I feel that this is inclusive. That being said, with a frequency of 2 Hz, it is difficult to rule out definitively status epilepticus which resolved with Ativan administration.  Of note, triphasic waves have been described commonly in cefepime induced neurotoxicity.  Roland Rack, MD [facility] [phone]  If 7pm- 7am, please page neurology on call as listed in [place].

## 2013-12-06 NOTE — Progress Notes (Signed)
  Echocardiogram 2D Echocardiogram has been performed.  Claudia [name] 12/06/2013, 2:48 PM

## 2013-12-06 NOTE — Progress Notes (Signed)
PT Cancellation Note  Patient Details Name: Claudia [name] MRN: [id number] DOB: [birth year]   Cancelled Treatment:    Reason Eval/Treat Not Completed: Medical issues which prohibited therapy   [name] 12/06/2013, 9:07 AM

## 2013-12-06 NOTE — Progress Notes (Signed)
Patient is still sedated and only responds to painful stimuli. Will monitor

## 2013-12-06 NOTE — Progress Notes (Signed)
All PO meds were held until patient is more awake and clears from the effects of IV ativan

## 2013-12-06 NOTE — Progress Notes (Signed)
Patient ID: Claudia [name], female   DOB: 27-Nov-1931, 80 y.o.   MRN: [id number] EVENING ROUNDS NOTE :     [address]       [place] [address]             [phone]                 28 Days Post-Op Procedure(s) (LRB): CORONARY ARTERY BYPASS GRAFTING (CABG), on pump, times two, using left internal mammary artery, cryo saphenous vein. (N/A) INTRAOPERATIVE TRANSESOPHAGEAL ECHOCARDIOGRAM (N/A) MITRAL VALVE (MV) REPLACEMENT (N/A)  Total Length of Stay:  LOS: 31 days  BP 123/58  Pulse 88  Temp(Src) 97.9 F (36.6 C) (Oral)  Resp 13  Ht 5\' 7"  (1.702 m)  Wt 187 lb 13.3 oz (85.2 kg)  BMI 29.41 kg/m2  SpO2 100%  .Intake/Output     10/01 0701 - 10/02 0700 10/02 0701 - 10/03 0700   P.O. 195    I.V. (mL/kg) 1253.8 (14.7) 578.7 (6.8)   IV Piggyback 370 85   Total Intake(mL/kg) 1818.8 (21.3) 663.7 (7.8)   Urine (mL/kg/hr) 1420 (0.7) 515 (0.5)   Total Output 1420 515   Net +398.8 +148.7          . sodium chloride 50 mL/hr at 12/06/13 1800  . DOPamine 2.5 mcg/kg/min (12/06/13 1400)     Lab Results  Component Value Date   WBC 5.5 12/06/2013   HGB 11.9* 12/06/2013   HCT 35.0* 12/06/2013   PLT 219 12/06/2013   GLUCOSE 151* 12/06/2013   CHOL 125 12/03/2012   TRIG 42 12/03/2012   HDL 72 12/03/2012   LDLCALC 45 12/03/2012   ALT 11 12/06/2013   AST 15 12/06/2013   NA 133* 12/06/2013   K 3.8 12/06/2013   CL 97 12/06/2013   CREATININE 3.50* 12/06/2013   BUN 44* 12/06/2013   CO2 24 12/06/2013   TSH 0.596 12/02/2012   INR 1.91* 12/06/2013   HGBA1C 7.2* 11/06/2013   Little more talkative today   Grace Isaac MD  Beeper [phone] Office [phone] 12/06/2013 6:59 PM

## 2013-12-06 NOTE — Progress Notes (Signed)
Routine EEG completed at bedside. Results pending.

## 2013-12-06 NOTE — Progress Notes (Addendum)
MD called back, updated on patient's status, will call EEG tech to do EEG.    Will obtain CMET (Per CVTS)  and ABG (per Neuro MD)  Per Neuro MD, no need for CT.   Will monitor

## 2013-12-06 NOTE — Progress Notes (Signed)
Neuro MD came to bedside to see patient, patient is still sedated, MD aware, patient seems to be sedated still d/t ATIVAN 2mg  IV that was given earlier.    Overall neuro status is unchanged, along with VS, which are stable.

## 2013-12-06 NOTE — Progress Notes (Signed)
Paged Stroke team NP with following page:  Pt. R Threats (2S15-SICU), has had some neuro changes, CT PA at bedside, would appreciate if you would call me back [phone] ([facility]- SICU)  Upon reassessment with CT PA, her mental status is now more worrisome, will page Neuro Team

## 2013-12-06 NOTE — Progress Notes (Signed)
[place] for change Cefepime to ceftriaxone for 2 more days Indication: Serratia sepsis with MVR  Allergies  Allergen Reactions  . Aspirin Other (See Comments)    High doses caused stomach bleeds    Patient Measurements: Height: 5\' 7"  (170.2 cm) Weight: 187 lb 13.3 oz (85.2 kg) IBW/kg (Calculated) : 61.6  Labs:  Recent Labs  12/04/13 0500 12/05/13 0335 12/06/13 0425  WBC 6.1 6.2 5.5  HGB 10.7* 9.7* 10.9*  PLT 229 202 219  CREATININE 2.75* 3.32* 3.55*    Microbiology: Recent Results (from the past 720 hour(s))  CULTURE, BLOOD (SINGLE)     Status: None   Collection Time    11/16/13  9:15 AM      Result Value Ref Range Status   Specimen Description BLOOD LEFT ARM   Final   Special Requests BOTTLES DRAWN AEROBIC ONLY 4CC   Final   Culture  Setup Time     Final   Value: 11/16/2013 15:20     Performed at [facility]   Culture     Final   Value: NO GROWTH 5 DAYS     Performed at [facility]   Report Status 11/22/2013 FINAL   Final  URINE CULTURE     Status: None   Collection Time    11/16/13  9:36 AM      Result Value Ref Range Status   Specimen Description URINE, CATHETERIZED   Final   Special Requests Normal   Final   Culture  Setup Time     Final   Value: 11/16/2013 19:34     Performed at [place]     Final   Value: NO GROWTH     Performed at [facility]   Culture     Final   Value: NO GROWTH     Performed at [facility]   Report Status 11/17/2013 FINAL   Final  CULTURE, RESPIRATORY (NON-EXPECTORATED)     Status: None   Collection Time    11/21/13  9:00 AM      Result Value Ref Range Status   Specimen Description TRACHEAL ASPIRATE   Final   Special Requests Normal   Final   Gram Stain     Final   Value: MODERATE WBC PRESENT,BOTH PMN AND MONONUCLEAR     RARE SQUAMOUS EPITHELIAL CELLS PRESENT     NO ORGANISMS SEEN     Performed at [facility]   Culture     Final   Value: FEW SERRATIA MARCESCENS     Performed at [facility]   Report Status 11/26/2013 FINAL   Final   Organism ID, Bacteria SERRATIA MARCESCENS   Final  CULTURE, BLOOD (ROUTINE X 2)     Status: None   Collection Time    11/21/13  9:35 AM      Result Value Ref Range Status   Specimen Description BLOOD RIGHT ARM   Final   Special Requests BOTTLES DRAWN AEROBIC ONLY 5CC   Final   Culture  Setup Time     Final   Value: 11/21/2013 14:44     Performed at [facility]   Culture     Final   Value: SERRATIA MARCESCENS     Note: Gram Stain Report Called to,Read Back By and Verified With: JESSIE SUTTER 11/23/13 AT 0445 RIDK     Performed at [facility]   Report  Status 11/26/2013 FINAL   Final   Organism ID, Bacteria SERRATIA MARCESCENS   Final  CULTURE, BLOOD (ROUTINE X 2)     Status: None   Collection Time    11/21/13  9:40 AM      Result Value Ref Range Status   Specimen Description BLOOD RIGHT FOREARM   Final   Special Requests BOTTLES DRAWN AEROBIC ONLY 5CC   Final   Culture  Setup Time     Final   Value: 11/21/2013 14:43     Performed at [facility]   Culture     Final   Value: SERRATIA MARCESCENS     Note: SUSCEPTIBILITIES PERFORMED ON PREVIOUS CULTURE WITHIN THE LAST 5 DAYS.     Note: Gram Stain Report Called to,Read Back By and Verified With: JESSIE SUTTER 11/23/13 AT 28 RIDK     Performed at [facility]   Report Status 11/25/2013 FINAL   Final  URINE CULTURE     Status: None   Collection Time    11/21/13 10:30 AM      Result Value Ref Range Status   Specimen Description URINE, CATHETERIZED   Final   Special Requests has completed 10 days of Ancef Normal   Final   Culture  Setup Time     Final   Value: 11/21/2013 17:18     Performed at [place]     Final   Value: 9,000 COLONIES/ML     Performed at [facility]   Culture     Final   Value: INSIGNIFICANT GROWTH     Performed  at [facility]   Report Status 11/23/2013 FINAL   Final  CLOSTRIDIUM DIFFICILE BY PCR     Status: None   Collection Time    11/21/13  9:00 PM      Result Value Ref Range Status   C difficile by pcr NEGATIVE  NEGATIVE Final  BODY FLUID CULTURE     Status: None   Collection Time    11/22/13 11:02 AM      Result Value Ref Range Status   Specimen Description PLEURAL FLUID RIGHT   Final   Special Requests 60 ML FLUID   Final   Gram Stain     Final   Value: NO WBC SEEN     NO ORGANISMS SEEN     Performed at [facility]   Culture     Final   Value: NO GROWTH 3 DAYS     Performed at [facility]   Report Status 11/26/2013 FINAL   Final  URINE CULTURE     Status: None   Collection Time    12/01/13 11:26 AM      Result Value Ref Range Status   Specimen Description URINE, RANDOM   Final   Special Requests NONE   Final   Culture  Setup Time     Final   Value: 12/01/2013 22:13     Performed at [place]     Final   Value: 30,000 COLONIES/ML     Performed at [facility]   Culture     Final   Value: Multiple bacterial morphotypes present, none predominant. Suggest appropriate recollection if clinically indicated.     Performed at [facility]   Report Status 12/03/2013 FINAL   Final  WOUND CULTURE     Status: None   Collection Time  12/05/13  7:41 PM      Result Value Ref Range Status   Specimen Description WOUND   Final   Special Requests IV SITE RIGHT SUBCLAVIAN   Final   Gram Stain     Final   Value: NO WBC SEEN     NO SQUAMOUS EPITHELIAL CELLS SEEN     NO ORGANISMS SEEN     Performed at [facility]   Culture     Final   Value: NO GROWTH     Performed at [facility]   Report Status PENDING   Incomplete     Assessment: 80 year old female Pod #28 CABG with MVR Blood cultures and sputum culture positive for Serratia  Transitioned from Zosyn to Cefepime on 9/21.  Now to transition from  cefepime to ceftriaxone per neuro request as cefepime may cause encephalopathy. Total  ABX day # 16  Cefepime day #13. Planning 14 days of treatment - Day 13 of 14.  WBC 5.5, creat 3.55. AF  Goal of Therapy:  Appropriate dosing  Plan:  Ceftriaxone 2 gm IV q24 x 2 days then stop. Pharmacy will sign off. Thank you Eudelia Bunch, Pharm.D. [phone] 12/06/2013 10:06 AM

## 2013-12-06 NOTE — Progress Notes (Signed)
Patient was being prepped for bedside EEG and patient was talking and then stopped.  Patient did respond to pain and and was following commands but did not talk, paged Neuro MD of acute change since patient is aphasic and ataxic.  Orders received for HEAD CT STAT, MD will come to bedside

## 2013-12-06 NOTE — Progress Notes (Signed)
Neuro MD at bedside, EEG in process per MD will give 2mg  IV ATIVAN and then take patient to HEAD CT SCAN,  Dr. Prescott Gum also at bedside, patient updated on status, will monitor.

## 2013-12-06 NOTE — Progress Notes (Addendum)
[address]       [place] [address]             [phone]      28 Days Post-Op Procedure(s) (LRB): CORONARY ARTERY BYPASS GRAFTING (CABG), on pump, times two, using left internal mammary artery, cryo saphenous vein. (N/A) INTRAOPERATIVE TRANSESOPHAGEAL ECHOCARDIOGRAM (N/A) MITRAL VALVE (MV) REPLACEMENT (N/A)  Subjective:  Ms. Swilling is not lucid today.  She is in and out of consciousness.  Will open eyes briefly attempts to answer question however becomes unresponsive quickly.  She was able to follow commands.  However, I feel patient looks worse today than she has previously.  Objective: Vital signs in last 24 hours: Temp:  [97.5 F (36.4 C)-99 F (37.2 C)] 97.8 F (36.6 C) (10/02 0714) Pulse Rate:  [76-95] 86 (10/02 0800) Cardiac Rhythm:  [-] Normal sinus rhythm (10/02 0800) Resp:  [9-36] 24 (10/02 0800) BP: (93-153)/(39-66) 136/54 mmHg (10/02 0800) SpO2:  [93 %-99 %] 94 % (10/02 0800) Weight:  [187 lb 13.3 oz (85.2 kg)] 187 lb 13.3 oz (85.2 kg) (10/02 0300)  Intake/Output from previous day: 10/01 0701 - 10/02 0700 In: 1818.8 [P.O.:195; I.V.:1253.8; IV Piggyback:370] Out: 1420 [Urine:1420] Intake/Output this shift: Total I/O In: 54.1 [I.V.:54.1] Out: -   General appearance: distracted, slowed mentation and looks worse today Heart: regular rate and rhythm Lungs: clear to auscultation bilaterally Abdomen: soft, non-tender; bowel sounds normal; no masses,  no organomegaly Extremities: edema trace to 1+ Wound: clean and dry, Central line site is erythematous packing in place  Lab Results:  Recent Labs  12/05/13 0335 12/06/13 0425  WBC 6.2 5.5  HGB 9.7* 10.9*  HCT 30.1* 33.6*  PLT 202 219   BMET:  Recent Labs  12/05/13 0335 12/06/13 0425  NA 133* 134*  K 3.8 4.0  CL 92* 93*  CO2 27 27  GLUCOSE 103* 127*  BUN 50* 51*  CREATININE 3.32* 3.55*  CALCIUM 8.4 8.8    PT/INR:  Recent Labs  12/06/13 0425  LABPROT 21.9*  INR  1.91*   ABG    Component Value Date/Time   PHART 7.550* 12/01/2013 1302   HCO3 28.1* 12/01/2013 1302   TCO2 29.1 12/01/2013 1302   ACIDBASEDEF 7.0* 11/11/2013 1642   O2SAT 98.0 12/01/2013 1302   CBG (last 3)   Recent Labs  12/05/13 2347 12/06/13 0351 12/06/13 0711  GLUCAP 171* 134* 123*    Assessment/Plan: S/P Procedure(s) (LRB): CORONARY ARTERY BYPASS GRAFTING (CABG), on pump, times two, using left internal mammary artery, cryo saphenous vein. (N/A) INTRAOPERATIVE TRANSESOPHAGEAL ECHOCARDIOGRAM (N/A) MITRAL VALVE (MV) REPLACEMENT (N/A)  1. CV- hemodynamically stable, restarted on Dopamine due to elevated renal function, maintaining NSR on Amiodarone 2. Pulm- off oxygen, some atelectasis on CXR continue IS 3. Renal- creatinine elevated at 3.55, good U/O with Lasix, restarted on Dopamine per Dr. Prescott Gum, Nephrology following 4. Neuro- patient ruled out for seizure activity, CT scan no acute processed 9/30, however patient visibly declined today, may warrant repeat study vs. MRI, Nephrology following 5. ID- serratia bacteremia- will d/c Cefipime start Rocephin per Neprhology request due to association with metabolic encephalopathy Day 10/14 6. INR 1.91, continue Coumadin at 2 mg daily 7. Dispo- patient continues to wax and wane on Neuro function, creatinine increasing, Dopamine per Dr. Prescott Gum     LOS: 31 days    BARRETT, Junie Panning 12/06/2013  Over the course of the day patient's mental status has improved-temporally related to 1 dose  of Ativan. Neurology feels she has metabolic encephalopathy-probable medication related. We will need to finish her course of IV antibiotics for her positive blood cultures for Serratia. We'll stop the amiodarone. Repeat CT scan today shows no evidence of stroke. Rising creatinine and low urine output R. improved on low-dose dopamine

## 2013-12-06 NOTE — Progress Notes (Signed)
STROKE TEAM PROGRESS NOTE   HISTORY Claudia [name] is an 80 y.o. 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 Has had a complicated recovery. Today when family entered the room the patient was breathing fast and loud. Patient was also unable to recognize family members. Nursing was contacted at that time and code stroke was called.  Patient is on Coumadin with an INR of 2.16.  Date last known well: Date: 12/01/2013  Time last known well: Time: 09:30  tPA Given: No: On Coumadin with an elevated INR   SUBJECTIVE (INTERVAL HISTORY) Her RN and her son are at the bedside. She is lethargic today, not as awake alert as yesterday. Her Cre elevated more and her INR therapeutic.  OBJECTIVE Temp:  [97.5 F (36.4 C)-99 F (37.2 C)] 97.9 F (36.6 C) (10/02 1512) Pulse Rate:  [79-94] 83 (10/02 1600) Cardiac Rhythm:  [-] Normal sinus rhythm (10/02 0800) Resp:  [9-36] 18 (10/02 1600) BP: (123-153)/(53-66) 124/54 mmHg (10/02 1600) SpO2:  [94 %-100 %] 99 % (10/02 1600) Weight:  [187 lb 13.3 oz (85.2 kg)] 187 lb 13.3 oz (85.2 kg) (10/02 0300)   Recent Labs Lab 12/05/13 1947 12/05/13 2347 12/06/13 0351 12/06/13 0711 12/06/13 1122  GLUCAP 237* 171* 134* 123* 176*    Recent Labs Lab 12/03/13 0337 12/04/13 0500 12/05/13 0335 12/06/13 0425 12/06/13 0909 12/06/13 0927  NA 130* 133* 133* 134* 135* 133*  K 3.9 3.8 3.8 4.0 4.1 3.8  CL 88* 92* 92* 93* 94* 97  CO2 30 29 27 27 24   --   GLUCOSE 134* 83 103* 127* 149* 151*  BUN 44* 46* 50* 51* 50* 44*  CREATININE 2.64* 2.75* 3.32* 3.55* 3.50* 3.50*  CALCIUM 8.7 8.5 8.4 8.8 9.0  --     Recent Labs Lab 12/03/13 0337 12/06/13 0909  AST 12 15  ALT 10 11  ALKPHOS 69 77  BILITOT 0.4 0.5  PROT 6.3 6.4  ALBUMIN 2.5* 2.8*    Recent Labs Lab 12/02/13 0416 12/03/13 0337 12/04/13 0500 12/05/13 0335 12/06/13 0425 12/06/13 0927  WBC 6.9 6.9 6.1 6.2 5.5  --   HGB 10.8* 10.8* 10.7* 9.7* 10.9* 11.9*  HCT 33.6* 33.8* 32.8* 30.1* 33.6*  35.0*  MCV 86.4 87.3 87.0 87.0 87.0  --   PLT 231 218 229 202 219  --    No results found for this basename: CKTOTAL, CKMB, CKMBINDEX, TROPONINI,  in the last 168 hours  Recent Labs  12/04/13 0500 12/06/13 0425  LABPROT 25.1* 21.9*  INR 2.28* 1.91*   No results found for this basename: COLORURINE, APPERANCEUR, LABSPEC, PHURINE, GLUCOSEU, HGBUR, BILIRUBINUR, KETONESUR, PROTEINUR, UROBILINOGEN, NITRITE, LEUKOCYTESUR,  in the last 72 hours     Component Value Date/Time   CHOL 125 12/03/2012 0355   TRIG 42 12/03/2012 0355   HDL 72 12/03/2012 0355   CHOLHDL 1.7 12/03/2012 0355   VLDL 8 12/03/2012 0355   LDLCALC 45 12/03/2012 0355   Lab Results  Component Value Date   HGBA1C 7.2* 11/06/2013   No results found for this basename: labopia,  cocainscrnur,  labbenz,  amphetmu,  thcu,  labbarb    No results found for this basename: ETH,  in the last 168 hours  Ct Head Wo Contrast  12/01/2013   IMPRESSION: 1. Atrophy and small vessel disease. 2.  No evidence for acute intracranial abnormality.      12/04/13 1. Focal hypoattenuation along the inferior left parietal lobe may be partial volume averaging along the tentorium.  A developing infarct is also considered.  2. Otherwise stable atrophy and white matter disease. This likely reflects the sequelae of chronic microvascular ischemia.   Dg Chest Port 1 View  12/02/2013    IMPRESSION: Stable appearance of the chest when compare with the prior exam.      12/01/2013   IMPRESSION: Enlargement of cardiac silhouette post MVR.  Bibasilar atelectasis with additional atelectasis or scarring in RIGHT upper lobe.     CUS - 11/05/13 - Bilateral: 1-39% ICA stenosis. Vertebral artery flow is antegrade.  2D echo - 11/05/13 - - Left ventricle: The cavity size was normal. There was mild concentric hypertrophy. Systolic function was normal. The estimated ejection fraction was in the range of 55% to 60%. Hypokinesis of the basal-midinferolateral and  inferior myocardium. Features are consistent with a pseudonormal left ventricular filling pattern, with concomitant abnormal relaxation and increased filling pressure (grade 2 diastolic dysfunction). Doppler parameters are consistent with both elevated ventricular end-diastolic filling pressure and elevated left atrial filling pressure. - Aortic valve: There was trivial regurgitation. - Mitral valve: Consider papillar muscle dysfunction with wall motion abnormality. Normal-sized, mildly to moderately calcified annulus. Moderately thickened, mildly calcified leaflets posterior. Mobility of the posterior leaflet was mildly restricted. There was moderate to severe regurgitation directed posteriorly. - Left atrium: The atrium was moderately dilated. - Atrial septum: The septum bowed from left to right, consistent with increased left atrial pressure.  LTM EEG 9/28-9/29: Triphasic activity is consistent with a moderate diffuse encephalopathy, consistent with the moderate diffuse slowing in the fragmented slow posterior rhythm; it further suggests a metabolic etiology. As noted in the report below there some aspects of the recording that raise a concern for nonconvulsive status epilepticus. However, this generalized triphasic activity tends to vary with state and disappears during sleep, which argues against nonconvulsive seizure. Further, after the prolonged periods of periodic discharges, there are no obvious focal regions of postictal slowing, as might be expected after prolonged focal seizure activity. Overall, most consistent with moderate diffuse encephalopathy.  LTM EEG 9/29-9/30 - Impression: This is an abnormal 24 hr continuous video-EEG  recording. Significant findings include: 1. Diffuse slowing, low voltage polymorphic theta/delta, even when fully alert 2. Generalized triphasic activity, poorly formed and in short runs only 3. Bilateral central/parietal sharp-wave complexes,   independently over the left and right hemispheres, isolated and in short runs 4. Slow fragmented posterior rhythm, 6-7 Hz Clinical Correlation: The findings indicate a moderate diffuse  encephalopathy, that appears to be improving. The findings do not  specify and etiology, but would be consistent with a  toxic/metabolic derangement. Also remaining are indications of  independent bihemipsheric foci of cortical irritability with  epileptogenic potential; no ongoing seizure was observed however.  Findings discussed with the ICU Attending.  12/06/13 EEG preliminary - diffuse triphasic wave, high amplitude slowing, at times rhythmic also. After ativan injection, background quickly improved. Discussed with Dr. Leonel Ramsay, cephalosporin drug toxicity in the setting of AKI is most likely the scenario.   PHYSICAL EXAM  Temp:  [97.5 F (36.4 C)-99 F (37.2 C)] 97.9 F (36.6 C) (10/02 1512) Pulse Rate:  [79-94] 83 (10/02 1600) Resp:  [9-36] 18 (10/02 1600) BP: (123-153)/(53-66) 124/54 mmHg (10/02 1600) SpO2:  [94 %-100 %] 99 % (10/02 1600) Weight:  [187 lb 13.3 oz (85.2 kg)] 187 lb 13.3 oz (85.2 kg) (10/02 0300)  General - Well nourished, well developed, lethargic.  Ophthalmologic - not able to see through.  Cardiovascular - Regular rate and rhythm  with no murmur.  Neuro - lethargic, able to answer her name but do not answer any other questions. PERRL, eyes move both sides, facial symmetrical, did not move tongue on exam. 4/5 BUEs and 3/5 BLEs. Again pronounced b/l arm asterixis, consistent with metabolic encephalopathy. Reflexes were 1+ in all extremities and she had no pathological reflexes. Gait not tested.   ASSESSMENT/PLAN  Claudia [name] is a 80 y.o. female with history of DM, HTN, HLD, s/p CABG and MVR on 11/08/13 was consulted for AMS and mutism as well as breathing difficulty. She is on coumadin and INR so far therapeutic. The presentation is not typical for stroke especially  no focal neuro exam. EEG largely metabolic encephalopathy but did raise concern of NCSE, vimpat loaded and maintain does 100mg  bid. However, LTM EEG showed more likely metabolic encephalopathy. CT repeat did not support stroke. Pt mental status much improved after, however became lethargic again for the last two days with more pronounced b/l asterixis, and her Cre trending up from 2.7 to 3.3 to 3.55, cefepime changed to rocephin, spot EEG today still consistent with metabolic encephalopathy, likely due to cephalosporin toxicity in the setting of AKI.   Metabolic encephalopathy vs. Seizure vs. TIA    Pt condition more consistent with metabolic encephalopathy, contributing factors include b/l asterixis, renal failure, hyponatremia, and respiratory alkalosis.  Although EEG concerning for seizure, but largely it showed metabolic encephalopathy.   MRI  And MRA not able to perform due to recent cardiac procedure. Carotid Doppler  Bilateral: 1-39% ICA stenosis. Vertebral artery flow is antegrade.  2D Echo 55-60%  warfarin prior to admission, now on warfarin, therapeutic INR 2.25 -> 2.28 -> 1.91  CT repeat showed hypodensity at left occipital-cerebellar area, most likely the sulcus along the tentorium, less likely infarction as it does not fit to the vascular distribution. Imaging discussed with Dr. Zenia Resides in Radiology.  CT repeat today did not show hemorrhage nor acute infarction.   EEG showed diffuse triphasic wave, high amplitude slowing, at times rhythmic also. After ativan injection, background quickly improved. Discussed with Dr. Leonel Ramsay, cephalosporin drug toxicity in the setting of AKI is most likely the scenario.  Recommend to stop rocephin and avoid cephalosporin class antibiotics.   LDL 45, no statin necessary as meeting goal of LDL <100  HgbA1c 7.2, elevated.  Warfarin for VTE prophylaxis  Seizure - initially EEG concerning for NCSE, but more consistent with encephalopathy -  continue vimpat 100mg  bid - LTM EEG no seizure but triphasic waves. - repeat EEG tomorrow  Metabolic encephalopathy - worse today  - more asterixis seen today - Cre trending up to 3.55, likely the cause of worsening mental status along with cefepime toxicity. - Na stable at 134 - ammonia level WNL, will repeat - pt has been on cefepime for a while, which is known may cause encephalopathy. Also recommend to stop rocephin and find out abx different from cephalosporin class according to drug sensitivity.  Diabetes  HgbA1c 7.2, Goal < 7.0  Uncontrolled  SSI   Educated patient about lifestyle changes for diabetes treatment prevention  Other Stroke Risk Factors - s/p CABG and MVR - acute renal failure - HTN - HLD  Hospital day # 31  This patient is critically ill due to AMS, AKI and bacteremia on antibiotics as well as hypotension on dopamine. She is at significant risk of neurological worsening form encephalopathy, medication toxicity, respiratory failure. This patient's care requires constant monitoring of vital signs, hemodynamics, respiratory and  cardiac monitoring, review of multiple databases, neurological assessment, discussion with family, other specialists and medical decision making of high complexity. I spent 50 minutes of neurocritical care time in the care of this patient.   Discussed with Dr. Leonel Ramsay, he will resume neuro care from am.   Rosalin Hawking, MD PhD Stroke Neurology 12/06/2013 5:05 PM    To contact Stroke Continuity provider, please refer to [email or web address] After hours, contact General Neurology

## 2013-12-06 NOTE — Progress Notes (Addendum)
Upon initial assessment of patient, patient did awaken to sleep, upon being asked where she is, she stated that she was still in the hospital, patient denied pain, patient was able to move all extremities on command, patient did have delayed responses when asked questions and seemed like she knew what she trying to say but she was delayed in responding (aphasia?)  While assessing patient and conversing with her, patient still instantly went out (very lethargic) and then with came back to once stimulated, per previous RN reports and assessments, this is not a new finding.  Will discuss findings with MDs this am during rounds.  VVS stable, neuro status is impaired but not changed, will monitor neuro status frequently  Will Monitor

## 2013-12-07 ENCOUNTER — Inpatient Hospital Stay (HOSPITAL_COMMUNITY): Payer: Medicare HMO

## 2013-12-07 DIAGNOSIS — R410 Disorientation, unspecified: Secondary | ICD-10-CM

## 2013-12-07 DIAGNOSIS — R4701 Aphasia: Secondary | ICD-10-CM

## 2013-12-07 LAB — CBC
HCT: 33.2 % — ABNORMAL LOW (ref 36.0–46.0)
Hemoglobin: 10.6 g/dL — ABNORMAL LOW (ref 12.0–15.0)
MCH: 27.8 pg (ref 26.0–34.0)
MCHC: 31.9 g/dL (ref 30.0–36.0)
MCV: 87.1 fL (ref 78.0–100.0)
Platelets: 219 10*3/uL (ref 150–400)
RBC: 3.81 MIL/uL — ABNORMAL LOW (ref 3.87–5.11)
RDW: 15.8 % — ABNORMAL HIGH (ref 11.5–15.5)
WBC: 5.9 10*3/uL (ref 4.0–10.5)

## 2013-12-07 LAB — GLUCOSE, CAPILLARY
GLUCOSE-CAPILLARY: 189 mg/dL — AB (ref 70–99)
Glucose-Capillary: 126 mg/dL — ABNORMAL HIGH (ref 70–99)
Glucose-Capillary: 104 mg/dL — ABNORMAL HIGH (ref 70–99)
Glucose-Capillary: 129 mg/dL — ABNORMAL HIGH (ref 70–99)
Glucose-Capillary: 204 mg/dL — ABNORMAL HIGH (ref 70–99)
Glucose-Capillary: 103 mg/dL — ABNORMAL HIGH (ref 70–99)

## 2013-12-07 LAB — BASIC METABOLIC PANEL
Anion gap: 16 — ABNORMAL HIGH (ref 5–15)
BUN: 48 mg/dL — ABNORMAL HIGH (ref 6–23)
CO2: 25 mEq/L (ref 19–32)
Calcium: 8.7 mg/dL (ref 8.4–10.5)
Chloride: 94 mEq/L — ABNORMAL LOW (ref 96–112)
Creatinine, Ser: 3.35 mg/dL — ABNORMAL HIGH (ref 0.50–1.10)
GFR calc Af Amer: 14 mL/min — ABNORMAL LOW (ref >90)
GFR calc non Af Amer: 12 mL/min — ABNORMAL LOW (ref >90)
Glucose, Bld: 104 mg/dL — ABNORMAL HIGH (ref 70–99)
Potassium: 3.8 mEq/L (ref 3.7–5.3)
Sodium: 135 mEq/L — ABNORMAL LOW (ref 137–147)

## 2013-12-07 MED ORDER — AMIODARONE HCL 200 MG PO TABS
200.0000 mg | ORAL_TABLET | Freq: Every day | ORAL | Status: DC
  Administered 2013-12-07 – 2013-12-08 (×2): 200 mg via ORAL
  Filled 2013-12-07 (×3): qty 1

## 2013-12-07 MED ORDER — ALTEPLASE 2 MG IJ SOLR
2.0000 mg | Freq: Once | INTRAMUSCULAR | Status: AC
  Administered 2013-12-07: 2 mg
  Filled 2013-12-07: qty 2

## 2013-12-07 MED ORDER — SODIUM CHLORIDE 0.9 % IV SOLN
150.0000 mg | Freq: Two times a day (BID) | INTRAVENOUS | Status: DC
  Administered 2013-12-07 – 2013-12-09 (×5): 150 mg via INTRAVENOUS
  Filled 2013-12-07 (×8): qty 15

## 2013-12-07 MED ORDER — PIPERACILLIN SOD-TAZOBACTAM SO 2.25 (2-0.25) G IV SOLR: 0.1000 mg | Freq: Four times a day (QID) | INTRAVENOUS | Status: DC

## 2013-12-07 MED ORDER — PIPERACILLIN-TAZOBACTAM IN DEX 2-0.25 GM/50ML IV SOLN
2.2500 g | Freq: Four times a day (QID) | INTRAVENOUS | Status: DC
  Administered 2013-12-07 – 2013-12-09 (×8): 2.25 g via INTRAVENOUS
  Filled 2013-12-07 (×10): qty 50

## 2013-12-07 NOTE — Progress Notes (Signed)
Per MD will stop ROCEPHIN

## 2013-12-07 NOTE — Progress Notes (Addendum)
Subjective: She is much improved today per nursing.   Following ativan administration yesterday, the patient was initally sedated, but on awakening was awake, alert and oriented. 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   Exam: Filed Vitals:   12/07/13 0700  BP: 127/57  Pulse: 90  Temp: 98.1 F (36.7 C)  Resp: 18   Gen: In bed, NAD MS: Awake, alert, oriented to month, place, but gives 2016 for year.  NM:MHWKG EOMI Motor: moves all extremities well.  Sensory:intact to LT    Impression: 80 yo F with episodes of confusion and aphasia with concerning EEG pattern. I suspect that this did represent NCSE yesterday. Given her renal failure and fairly large doses of cefipime, I suspect cefepime induced neurotoxicity as an etiology. Her improvement with treatment is reassuring. I would favor increasing her vimpat slightly for the time being.  Amiodarone can casue neurotoxicty, but usually this manifests as ataxia and tremor, and I am not familiar with it causing NCSE.   Recommendations: 1) If cephalosporin is needed, agree that ceftriaxone has lower chance of causing neurotoxicity, but if possible, then other agent, possibly pip/tazo, would be preferable. I would also avoid fluoroquinolones. 2) increase vimpat to 150mg  BID   Roland Rack, MD [facility] [phone]  If 7pm- 7am, please page neurology on call as listed in [place].

## 2013-12-07 NOTE — Progress Notes (Signed)
Patient ID: Claudia [name], female   DOB: 06/09/1931, 80 y.o.   MRN: [id number] T[facility] DAILY ICU PROGRESS NOTE                   [address]            [place] [address]          [phone]   29 Days Post-Op Procedure(s) (LRB): CORONARY ARTERY BYPASS GRAFTING (CABG), on pump, times two, using left internal mammary artery, cryo saphenous vein. (N/A) INTRAOPERATIVE TRANSESOPHAGEAL ECHOCARDIOGRAM (N/A) MITRAL VALVE (MV) REPLACEMENT (N/A)  Total Length of Stay:  LOS: 32 days   Subjective: Awake and alert more talkative  Objective: Vital signs in last 24 hours: Temp:  [97.5 F (36.4 C)-98.5 F (36.9 C)] 98.1 F (36.7 C) (10/03 0700) Pulse Rate:  [83-93] 89 (10/03 1000) Cardiac Rhythm:  [-] Normal sinus rhythm (10/03 0800) Resp:  [12-28] 17 (10/03 1000) BP: (111-143)/(44-99) 123/52 mmHg (10/03 1000) SpO2:  [94 %-100 %] 97 % (10/03 1000) Weight:  [185 lb 6.5 oz (84.1 kg)] 185 lb 6.5 oz (84.1 kg) (10/03 0500)  Filed Weights   12/05/13 0600 12/06/13 0300 12/07/13 0500  Weight: 186 lb 4.6 oz (84.5 kg) 187 lb 13.3 oz (85.2 kg) 185 lb 6.5 oz (84.1 kg)    Weight change: -2 lb 6.8 oz (-1.1 kg)   Hemodynamic parameters for last 24 hours:    Intake/Output from previous day: 10/02 0701 - 10/03 0700 In: 1564.3 [P.O.:200; I.V.:1244.3; IV Piggyback:120] Out: 1640 [Urine:1640]  Intake/Output this shift: Total I/O In: 246.4 [I.V.:246.4] Out: 400 [Urine:400]  Current Meds: Scheduled Meds: . citalopram  20 mg Oral Daily  . darbepoetin (ARANESP) injection - NON-DIALYSIS  100 mcg Subcutaneous Q Wed-1800  . feeding supplement (GLUCERNA SHAKE)  237 mL Oral TID WC  . furosemide  40 mg Intravenous BID  . insulin aspart  0-24 Units Subcutaneous 6 times per day  . lacosamide (VIMPAT) IV  150 mg Intravenous Q12H  . magic mouthwash  5 mL Oral TID  . neomycin-bacitracin-polymyxin   Topical Daily  . nystatin cream   Topical TID  . pantoprazole  40 mg Oral Daily  .  piperacillin-tazobactam (ZOSYN)  IV  0.1 mg of piperacillin Intravenous 4 times per day  . sodium chloride  10-40 mL Intracatheter Q12H  . warfarin  2 mg Oral q1800  . Warfarin - Physician Dosing Inpatient   Does not apply q1800   Continuous Infusions: . sodium chloride 50 mL/hr at 12/07/13 1000  . DOPamine 2.5 mcg/kg/min (12/07/13 1000)   PRN Meds:.glucose-Vitamin C, Influenza vac split quadrivalent PF, levalbuterol, metoprolol, ondansetron (ZOFRAN) IV, promethazine, RESOURCE THICKENUP CLEAR, sodium chloride  General appearance: alert, cooperative and appears older than stated age Neurologic: intact Heart: irregularly irregular rhythm Lungs: diminished breath sounds bibasilar Abdomen: soft, non-tender; bowel sounds normal; no masses,  no organomegaly Extremities: extremities normal, atraumatic, no cyanosis or edema and Homans sign is negative, no sign of DVT Wound: intact  Lab Results: CBC: Recent Labs  12/06/13 0425 12/06/13 0927 12/07/13 0337  WBC 5.5  --  5.9  HGB 10.9* 11.9* 10.6*  HCT 33.6* 35.0* 33.2*  PLT 219  --  219   BMET:  Recent Labs  12/06/13 0909 12/06/13 0927 12/07/13 0337  NA 135* 133* 135*  K 4.1 3.8 3.8  CL 94* 97 94*  CO2 24  --  25  GLUCOSE 149* 151* 104*  BUN 50* 44* 48*  CREATININE 3.50*  3.50* 3.35*  CALCIUM 9.0  --  8.7    PT/INR:  Recent Labs  12/06/13 0425  LABPROT 21.9*  INR 1.91*   Radiology: Ct Head Wo Contrast  12/06/2013   CLINICAL DATA:  Mental status change. Subsequent encounter for mental status changes beginning yesterday.  EXAM: CT HEAD WITHOUT CONTRAST  TECHNIQUE: Contiguous axial images were obtained from the base of the skull through the vertex without intravenous contrast.  COMPARISON:  CT head without contrast 12/04/2013.  FINDINGS: A focal area of hypoattenuation versus volume-averaging along the inferior aspect of the left occipital lobe is stable. There is no hemorrhage or mass lesion. The basal ganglia are intact.  There is diffuse decreased gray-white differentiation along the cortex over the convexities bilaterally compared to the prior studies  The paranasal sinuses and mastoid air cells are clear. Atherosclerotic calcifications are again noted.  IMPRESSION: 1. Progressive diffuse loss of gray-white differentiation over the cerebral convexities bilaterally. This raises concern for diffuse hypoxia. If the patient remains unresponsive, nuclear medicine cerebral fusion exam may be useful for further evaluation. 2. The basal ganglia are preserved. 3. No evidence for hemorrhage. 4. Hypoattenuation on the left likely represents partial volume averaging versus infarct along the inferior parietal lobe.   Electronically Signed   By: Lawrence Santiago M.D.   On: 12/06/2013 11:31   Dg Chest Port 1 View  12/07/2013   CLINICAL DATA:  History of CHF.  Status post CABG.  EXAM: PORTABLE CHEST - 1 VIEW  COMPARISON:  12/06/2013  FINDINGS: Right internal jugular line terminates at the cavoatrial junction. Prior median sternotomy. Mitral valve repair. Small bilateral pleural effusions. New or increased. No pneumothorax. Interstitial edema is minimally increased, moderate. Worsened bibasilar airspace disease.  IMPRESSION: Overall worsened aeration, with increased interstitial edema and new or increased bilateral pleural effusions.  Bibasilar Airspace disease, likely atelectasis.   Electronically Signed   By: Abigail Miyamoto M.D.   On: 12/07/2013 10:20   Dg Chest Port 1 View  12/06/2013   CLINICAL DATA:  Chest pain following mitral valve replacement and coronary bypass grafting  EXAM: PORTABLE CHEST - 1 VIEW  COMPARISON:  12/04/2013  FINDINGS: Cardiac shadow is stable. There are changes consistent with prior mitral valve replacement, coronary bypass grafting and coronary artery stenting. Bibasilar atelectatic changes are again identified. The overall appearance is stable from the prior exam. No new focal abnormality is seen. The right central  venous line is again noted in the mid right atrium.  IMPRESSION: When compared with the prior exam, the overall appearance is stable with bibasilar atelectatic changes.   Electronically Signed   By: Inez Catalina M.D.   On: 12/06/2013 08:13   Lab Results  Component Value Date   INR 1.91* 12/06/2013   INR 2.28* 12/04/2013   INR 2.25* 12/03/2013   Chronic Kidney Disease    Stage IV   GFR 15-29    Lab Results  Component Value Date   CREATININE 3.35* 12/07/2013   Estimated Creatinine Clearance: 15.2 ml/min (by C-G formula based on Cr of 3.35).  Assessment/Plan: S/P Procedure(s) (LRB): CORONARY ARTERY BYPASS GRAFTING (CABG), on pump, times two, using left internal mammary artery, cryo saphenous vein. (N/A) INTRAOPERATIVE TRANSESOPHAGEAL ECHOCARDIOGRAM (N/A) MITRAL VALVE (MV) REPLACEMENT (N/A) Now back in afib- resume cordarone Neurology has changed recommendation for antibiotics change to zosyn On coumadin Stage 4 ckd   Berle Fitz B 12/07/2013 10:56 AM

## 2013-12-07 NOTE — Progress Notes (Signed)
  Amiodarone Drug - Drug Interaction Consult Note  Recommendations: - monitor INR closely while patient's on both amiodarone and coumadin.  May need to be conservative with coumadin dose. - monitor potassium level while on lasix  Amiodarone is metabolized by the cytochrome P450 system and therefore has the potential to cause many drug interactions. Amiodarone has an average plasma half-life of 50 days (range 20 to 100 days).   There is potential for drug interactions to occur several weeks or months after stopping treatment and the onset of drug interactions may be slow after initiating amiodarone.   []  Statins: Increased risk of myopathy. Simvastatin- restrict dose to 20mg  daily. Other statins: counsel patients to report any muscle pain or weakness immediately.  [x]  Anticoagulants: Amiodarone can increase anticoagulant effect. Consider warfarin dose reduction. Patients should be monitored closely and the dose of anticoagulant altered accordingly, remembering that amiodarone levels take several weeks to stabilize.  []  Antiepileptics: Amiodarone can increase plasma concentration of phenytoin, the dose should be reduced. Note that small changes in phenytoin dose can result in large changes in levels. Monitor patient and counsel on signs of toxicity.  []  Beta blockers: increased risk of bradycardia, AV block and myocardial depression. Sotalol - avoid concomitant use.  []   Calcium channel blockers (diltiazem and verapamil): increased risk of bradycardia, AV block and myocardial depression.  []   Cyclosporine: Amiodarone increases levels of cyclosporine. Reduced dose of cyclosporine is recommended.  []  Digoxin dose should be halved when amiodarone is started.  [x]  Diuretics: increased risk of cardiotoxicity if hypokalemia occurs.  []  Oral hypoglycemic agents (glyburide, glipizide, glimepiride): increased risk of hypoglycemia. Patient's glucose levels should be monitored closely when initiating  amiodarone therapy.   []  Drugs that prolong the QT interval:  Torsades de pointes risk may be increased with concurrent use - avoid if possible.  Monitor QTc, also keep magnesium/potassium WNL if concurrent therapy can't be avoided. Marland Kitchen Antibiotics: e.g. fluoroquinolones, erythromycin. . Antiarrhythmics: e.g. quinidine, procainamide, disopyramide, sotalol. . Antipsychotics: e.g. phenothiazines, haloperidol.  . Lithium, tricyclic antidepressants, and methadone. Thank You,   Lynelle Doctor  12/07/2013 11:32 AM

## 2013-12-07 NOTE — Progress Notes (Signed)
[facility] ROUNDING NOTE   Subjective:   Interval History:  Much improved  Objective:  Vital signs in last 24 hours:  Temp:  [97.5 F (36.4 C)-98.5 F (36.9 C)] 98.1 F (36.7 C) (10/03 0700) Pulse Rate:  [83-93] 89 (10/03 1000) Resp:  [12-28] 17 (10/03 1000) BP: (111-143)/(44-99) 123/52 mmHg (10/03 1000) SpO2:  [94 %-100 %] 97 % (10/03 1000) Weight:  [84.1 kg (185 lb 6.5 oz)] 84.1 kg (185 lb 6.5 oz) (10/03 0500)  Weight change: -1.1 kg (-2 lb 6.8 oz) Filed Weights   12/05/13 0600 12/06/13 0300 12/07/13 0500  Weight: 84.5 kg (186 lb 4.6 oz) 85.2 kg (187 lb 13.3 oz) 84.1 kg (185 lb 6.5 oz)    Intake/Output: I/O last 3 completed shifts: In: 2248.5 [P.O.:200; I.V.:1893.5; IV Piggyback:155] Out: 2450 [Urine:2450]   Intake/Output this shift:  Total I/O In: 246.4 [I.V.:246.4] Out: 400 [Urine:400]  General: Obese female, alert, cooperative, NAD. On 24 hour EEG.  HEENT: PERRL, EOMI. Moist mucus membranes Neck: Full range of motion without pain, supple, no lymphadenopathy or carotid bruits. RIJ CVL.  Lungs: Clear to ascultation bilaterally, normal work of respiration, no wheezes, rales, rhonchi.  Heart: RRR, no murmurs, gallops, or rubs Abdomen: Soft, non-tender, non-distended, BS +  Extremities: +Trace pitting edema.  Neurologic: Alert & oriented x3, cranial nerves II-XII intact, strength grossly intact, sensation intact to light     Basic Metabolic Panel:  Recent Labs Lab 12/04/13 0500 12/05/13 0335 12/06/13 0425 12/06/13 0909 12/06/13 0927 12/07/13 0337  NA 133* 133* 134* 135* 133* 135*  K 3.8 3.8 4.0 4.1 3.8 3.8  CL 92* 92* 93* 94* 97 94*  CO2 29 27 27 24   --  25  GLUCOSE 83 103* 127* 149* 151* 104*  BUN 46* 50* 51* 50* 44* 48*  CREATININE 2.75* 3.32* 3.55* 3.50* 3.50* 3.35*  CALCIUM 8.5 8.4 8.8 9.0  --  8.7    Liver Function Tests:  Recent Labs Lab 12/03/13 0337 12/06/13 0909  AST 12 15  ALT 10 11  ALKPHOS 69 77  BILITOT 0.4 0.5   PROT 6.3 6.4  ALBUMIN 2.5* 2.8*   No results found for this basename: LIPASE, AMYLASE,  in the last 168 hours  Recent Labs Lab 12/04/13 0500 12/06/13 0928  AMMONIA 22 22    CBC:  Recent Labs Lab 12/03/13 0337 12/04/13 0500 12/05/13 0335 12/06/13 0425 12/06/13 0927 12/07/13 0337  WBC 6.9 6.1 6.2 5.5  --  5.9  HGB 10.8* 10.7* 9.7* 10.9* 11.9* 10.6*  HCT 33.8* 32.8* 30.1* 33.6* 35.0* 33.2*  MCV 87.3 87.0 87.0 87.0  --  87.1  PLT 218 229 202 219  --  219    Cardiac Enzymes: No results found for this basename: CKTOTAL, CKMB, CKMBINDEX, TROPONINI,  in the last 168 hours  BNP: No components found with this basename: POCBNP,   CBG:  Recent Labs Lab 12/06/13 1122 12/06/13 1511 12/06/13 1933 12/06/13 2327 12/07/13 0413  GLUCAP 176* 101* 201* 126* 104*    Microbiology: Results for orders placed during the hospital encounter of 11/05/13  MRSA PCR SCREENING     Status: None   Collection Time    11/05/13  1:28 PM      Result Value Ref Range Status   MRSA by PCR NEGATIVE  NEGATIVE Final   Comment:            The GeneXpert MRSA Assay (FDA     approved for NASAL specimens  only), is one component of a     comprehensive MRSA colonization     surveillance program. It is not     intended to diagnose MRSA     infection nor to guide or     monitor treatment for     MRSA infections.  SURGICAL PCR SCREEN     Status: None   Collection Time    11/06/13  8:13 AM      Result Value Ref Range Status   MRSA, PCR NEGATIVE  NEGATIVE Final   Staphylococcus aureus NEGATIVE  NEGATIVE Final   Comment:            The Xpert SA Assay (FDA     approved for NASAL specimens     in patients over 8 years of age),     is one component of     a comprehensive surveillance     program.  Test performance has     been validated by [employer] for patients greater     than or equal to 53 year old.     It is not intended     to diagnose infection nor to     guide or monitor  treatment.  CULTURE, BLOOD (SINGLE)     Status: None   Collection Time    11/16/13  9:15 AM      Result Value Ref Range Status   Specimen Description BLOOD LEFT ARM   Final   Special Requests BOTTLES DRAWN AEROBIC ONLY 4CC   Final   Culture  Setup Time     Final   Value: 11/16/2013 15:20     Performed at [facility]   Culture     Final   Value: NO GROWTH 5 DAYS     Performed at [facility]   Report Status 11/22/2013 FINAL   Final  URINE CULTURE     Status: None   Collection Time    11/16/13  9:36 AM      Result Value Ref Range Status   Specimen Description URINE, CATHETERIZED   Final   Special Requests Normal   Final   Culture  Setup Time     Final   Value: 11/16/2013 19:34     Performed at [place]     Final   Value: NO GROWTH     Performed at [facility]   Culture     Final   Value: NO GROWTH     Performed at [facility]   Report Status 11/17/2013 FINAL   Final  CULTURE, RESPIRATORY (NON-EXPECTORATED)     Status: None   Collection Time    11/21/13  9:00 AM      Result Value Ref Range Status   Specimen Description TRACHEAL ASPIRATE   Final   Special Requests Normal   Final   Gram Stain     Final   Value: MODERATE WBC PRESENT,BOTH PMN AND MONONUCLEAR     RARE SQUAMOUS EPITHELIAL CELLS PRESENT     NO ORGANISMS SEEN     Performed at [facility]   Culture     Final   Value: FEW SERRATIA MARCESCENS     Performed at [facility]   Report Status 11/26/2013 FINAL   Final   Organism ID, Bacteria SERRATIA MARCESCENS   Final  CULTURE, BLOOD (ROUTINE X 2)     Status: None   Collection Time  11/21/13  9:35 AM      Result Value Ref Range Status   Specimen Description BLOOD RIGHT ARM   Final   Special Requests BOTTLES DRAWN AEROBIC ONLY 5CC   Final   Culture  Setup Time     Final   Value: 11/21/2013 14:44     Performed at [facility]   Culture     Final   Value: SERRATIA MARCESCENS      Note: Gram Stain Report Called to,Read Back By and Verified With: JESSIE SUTTER 11/23/13 AT 0445 RIDK     Performed at [facility]   Report Status 11/26/2013 FINAL   Final   Organism ID, Bacteria SERRATIA MARCESCENS   Final  CULTURE, BLOOD (ROUTINE X 2)     Status: None   Collection Time    11/21/13  9:40 AM      Result Value Ref Range Status   Specimen Description BLOOD RIGHT FOREARM   Final   Special Requests BOTTLES DRAWN AEROBIC ONLY 5CC   Final   Culture  Setup Time     Final   Value: 11/21/2013 14:43     Performed at [facility]   Culture     Final   Value: SERRATIA MARCESCENS     Note: SUSCEPTIBILITIES PERFORMED ON PREVIOUS CULTURE WITHIN THE LAST 5 DAYS.     Note: Gram Stain Report Called to,Read Back By and Verified With: JESSIE SUTTER 11/23/13 AT 63 RIDK     Performed at [facility]   Report Status 11/25/2013 FINAL   Final  URINE CULTURE     Status: None   Collection Time    11/21/13 10:30 AM      Result Value Ref Range Status   Specimen Description URINE, CATHETERIZED   Final   Special Requests has completed 10 days of Ancef Normal   Final   Culture  Setup Time     Final   Value: 11/21/2013 17:18     Performed at [place]     Final   Value: 9,000 COLONIES/ML     Performed at [facility]   Culture     Final   Value: INSIGNIFICANT GROWTH     Performed at [facility]   Report Status 11/23/2013 FINAL   Final  CLOSTRIDIUM DIFFICILE BY PCR     Status: None   Collection Time    11/21/13  9:00 PM      Result Value Ref Range Status   C difficile by pcr NEGATIVE  NEGATIVE Final  BODY FLUID CULTURE     Status: None   Collection Time    11/22/13 11:02 AM      Result Value Ref Range Status   Specimen Description PLEURAL FLUID RIGHT   Final   Special Requests 60 ML FLUID   Final   Gram Stain     Final   Value: NO WBC SEEN     NO ORGANISMS SEEN     Performed at [facility]   Culture      Final   Value: NO GROWTH 3 DAYS     Performed at [facility]   Report Status 11/26/2013 FINAL   Final  URINE CULTURE     Status: None   Collection Time    12/01/13 11:26 AM      Result Value Ref Range Status   Specimen Description URINE, RANDOM   Final   Special  Requests NONE   Final   Culture  Setup Time     Final   Value: 12/01/2013 22:13     Performed at [place]     Final   Value: 30,000 COLONIES/ML     Performed at [facility]   Culture     Final   Value: Multiple bacterial morphotypes present, none predominant. Suggest appropriate recollection if clinically indicated.     Performed at [facility]   Report Status 12/03/2013 FINAL   Final  WOUND CULTURE     Status: None   Collection Time    12/05/13  7:41 PM      Result Value Ref Range Status   Specimen Description WOUND   Final   Special Requests IV SITE RIGHT SUBCLAVIAN   Final   Gram Stain     Final   Value: NO WBC SEEN     NO SQUAMOUS EPITHELIAL CELLS SEEN     NO ORGANISMS SEEN     Performed at [facility]   Culture     Final   Value: NO GROWTH 1 DAY     Performed at [facility]   Report Status PENDING   Incomplete  WOUND CULTURE     Status: None   Collection Time    12/06/13  2:57 PM      Result Value Ref Range Status   Specimen Description WOUND NECK   Final   Special Requests Normal   Final   Gram Stain     Final   Value: RARE WBC PRESENT,BOTH PMN AND MONONUCLEAR     NO SQUAMOUS EPITHELIAL CELLS SEEN     NO ORGANISMS SEEN     Performed at [facility]   Culture     Final   Value: NO GROWTH 1 DAY     Performed at [facility]   Report Status PENDING   Incomplete    Coagulation Studies:  Recent Labs  12/06/13 0425  LABPROT 21.9*  INR 1.91*    Urinalysis: No results found for this basename: COLORURINE, APPERANCEUR, LABSPEC, PHURINE, GLUCOSEU, HGBUR, BILIRUBINUR, KETONESUR, PROTEINUR, UROBILINOGEN, NITRITE,  LEUKOCYTESUR,  in the last 72 hours    Imaging: Ct Head Wo Contrast  12/06/2013   CLINICAL DATA:  Mental status change. Subsequent encounter for mental status changes beginning yesterday.  EXAM: CT HEAD WITHOUT CONTRAST  TECHNIQUE: Contiguous axial images were obtained from the base of the skull through the vertex without intravenous contrast.  COMPARISON:  CT head without contrast 12/04/2013.  FINDINGS: A focal area of hypoattenuation versus volume-averaging along the inferior aspect of the left occipital lobe is stable. There is no hemorrhage or mass lesion. The basal ganglia are intact. There is diffuse decreased gray-white differentiation along the cortex over the convexities bilaterally compared to the prior studies  The paranasal sinuses and mastoid air cells are clear. Atherosclerotic calcifications are again noted.  IMPRESSION: 1. Progressive diffuse loss of gray-white differentiation over the cerebral convexities bilaterally. This raises concern for diffuse hypoxia. If the patient remains unresponsive, nuclear medicine cerebral fusion exam may be useful for further evaluation. 2. The basal ganglia are preserved. 3. No evidence for hemorrhage. 4. Hypoattenuation on the left likely represents partial volume averaging versus infarct along the inferior parietal lobe.   Electronically Signed   By: Lawrence Santiago M.D.   On: 12/06/2013 11:31   Dg Chest Port 1 View  12/07/2013   CLINICAL DATA:  History of CHF.  Status post CABG.  EXAM: PORTABLE CHEST - 1 VIEW  COMPARISON:  12/06/2013  FINDINGS: Right internal jugular line terminates at the cavoatrial junction. Prior median sternotomy. Mitral valve repair. Small bilateral pleural effusions. New or increased. No pneumothorax. Interstitial edema is minimally increased, moderate. Worsened bibasilar airspace disease.  IMPRESSION: Overall worsened aeration, with increased interstitial edema and new or increased bilateral pleural effusions.  Bibasilar Airspace  disease, likely atelectasis.   Electronically Signed   By: Abigail Miyamoto M.D.   On: 12/07/2013 10:20   Dg Chest Port 1 View  12/06/2013   CLINICAL DATA:  Chest pain following mitral valve replacement and coronary bypass grafting  EXAM: PORTABLE CHEST - 1 VIEW  COMPARISON:  12/04/2013  FINDINGS: Cardiac shadow is stable. There are changes consistent with prior mitral valve replacement, coronary bypass grafting and coronary artery stenting. Bibasilar atelectatic changes are again identified. The overall appearance is stable from the prior exam. No new focal abnormality is seen. The right central venous line is again noted in the mid right atrium.  IMPRESSION: When compared with the prior exam, the overall appearance is stable with bibasilar atelectatic changes.   Electronically Signed   By: Inez Catalina M.D.   On: 12/06/2013 08:13     Medications:   . sodium chloride 50 mL/hr at 12/07/13 1000  . DOPamine 2.5 mcg/kg/min (12/07/13 1000)   . amiodarone  200 mg Oral Daily  . citalopram  20 mg Oral Daily  . darbepoetin (ARANESP) injection - NON-DIALYSIS  100 mcg Subcutaneous Q Wed-1800  . feeding supplement (GLUCERNA SHAKE)  237 mL Oral TID WC  . furosemide  40 mg Intravenous BID  . insulin aspart  0-24 Units Subcutaneous 6 times per day  . lacosamide (VIMPAT) IV  150 mg Intravenous Q12H  . magic mouthwash  5 mL Oral TID  . neomycin-bacitracin-polymyxin   Topical Daily  . nystatin cream   Topical TID  . pantoprazole  40 mg Oral Daily  . piperacillin-tazobactam (ZOSYN)  IV  0.1 mg of piperacillin Intravenous 4 times per day  . sodium chloride  10-40 mL Intracatheter Q12H  . warfarin  2 mg Oral q1800  . Warfarin - Physician Dosing Inpatient   Does not apply q1800   glucose-Vitamin C, Influenza vac split quadrivalent PF, levalbuterol, metoprolol, ondansetron (ZOFRAN) IV, promethazine, RESOURCE THICKENUP CLEAR, sodium chloride  Assessment/ Plan:  Renal- Decreased lasix to 40mg  BID  Acute  Encephalopathy- Seizure identified by EEG, started on Vimpat. Some question of metabolic encephalopathy as etiology. Neurology following.  Hypertension/volume- Improved, at baseline. Continue diuretic program as above. Follow daily weights, UOP.  Serratia Bacteremia-Cefepime on 11/25/13.  Anemia- Hb 10.7  Hypokalemia- 3.8 this AM. Continue to monitor.  Hyponatremia- Stable and improved  S/p CABG/MVR  AFib on heparin, BB, amio po- In NSR, on po amio      LOS: 45 Genaro Bekker W @TODAY @11 :55 AM

## 2013-12-07 NOTE — Progress Notes (Signed)
Patient ID: [name], female   DOB: Jul 30, 1931, 80 y.o.   MRN: [id number] EVENING ROUNDS NOTE :     [address]       [place] [address]             [phone]                 29 Days Post-Op Procedure(s) (LRB): CORONARY ARTERY BYPASS GRAFTING (CABG), on pump, times two, using left internal mammary artery, cryo saphenous vein. (N/A) INTRAOPERATIVE TRANSESOPHAGEAL ECHOCARDIOGRAM (N/A) MITRAL VALVE (MV) REPLACEMENT (N/A)  Total Length of Stay:  LOS: 32 days  BP 124/50  Pulse 104  Temp(Src) 98.5 F (36.9 C) (Oral)  Resp 17  Ht 5\' 7"  (1.702 m)  Wt 185 lb 6.5 oz (84.1 kg)  BMI 29.03 kg/m2  SpO2 96%  .Intake/Output     10/03 0701 - 10/04 0700   P.O. 580   I.V. (mL/kg) 733.3 (8.7)   IV Piggyback 140   Total Intake(mL/kg) 1453.3 (17.3)   Urine (mL/kg/hr) 1155 (1.1)   Total Output 1155   Net +298.3         . sodium chloride 50 mL/hr at 12/07/13 1900  . DOPamine 2.5 mcg/kg/min (12/07/13 1900)     Lab Results  Component Value Date   WBC 5.9 12/07/2013   HGB 10.6* 12/07/2013   HCT 33.2* 12/07/2013   PLT 219 12/07/2013   GLUCOSE 104* 12/07/2013   CHOL 125 12/03/2012   TRIG 42 12/03/2012   HDL 72 12/03/2012   LDLCALC 45 12/03/2012   ALT 11 12/06/2013   AST 15 12/06/2013   NA 135* 12/07/2013   K 3.8 12/07/2013   CL 94* 12/07/2013   CREATININE 3.35* 12/07/2013   BUN 48* 12/07/2013   CO2 25 12/07/2013   TSH 0.596 12/02/2012   INR 1.91* 12/06/2013   HGBA1C 7.2* 11/06/2013   Stable day, alert and eating  Grace Isaac MD  Beeper [phone] Office [phone] 12/07/2013 7:43 PM

## 2013-12-08 LAB — CBC
HCT: 33.6 % — ABNORMAL LOW (ref 36.0–46.0)
Hemoglobin: 10.9 g/dL — ABNORMAL LOW (ref 12.0–15.0)
MCH: 28.2 pg (ref 26.0–34.0)
MCHC: 32.4 g/dL (ref 30.0–36.0)
MCV: 87 fL (ref 78.0–100.0)
Platelets: 207 10*3/uL (ref 150–400)
RBC: 3.86 MIL/uL — ABNORMAL LOW (ref 3.87–5.11)
RDW: 15.8 % — ABNORMAL HIGH (ref 11.5–15.5)
WBC: 5.8 10*3/uL (ref 4.0–10.5)

## 2013-12-08 LAB — GLUCOSE, CAPILLARY
GLUCOSE-CAPILLARY: 115 mg/dL — AB (ref 70–99)
Glucose-Capillary: 136 mg/dL — ABNORMAL HIGH (ref 70–99)
Glucose-Capillary: 142 mg/dL — ABNORMAL HIGH (ref 70–99)
Glucose-Capillary: 163 mg/dL — ABNORMAL HIGH (ref 70–99)
Glucose-Capillary: 179 mg/dL — ABNORMAL HIGH (ref 70–99)
Glucose-Capillary: 250 mg/dL — ABNORMAL HIGH (ref 70–99)
Glucose-Capillary: 255 mg/dL — ABNORMAL HIGH (ref 70–99)

## 2013-12-08 LAB — WOUND CULTURE
Culture: NO GROWTH
Culture: NO GROWTH
Gram Stain: NONE SEEN
Special Requests: NORMAL

## 2013-12-08 LAB — PROTIME-INR
INR: 2.06 — ABNORMAL HIGH (ref 0.00–1.49)
Prothrombin Time: 23.2 s — ABNORMAL HIGH (ref 11.6–15.2)

## 2013-12-08 LAB — BASIC METABOLIC PANEL
Anion gap: 15 (ref 5–15)
BUN: 47 mg/dL — ABNORMAL HIGH (ref 6–23)
CO2: 24 mEq/L (ref 19–32)
Calcium: 8.7 mg/dL (ref 8.4–10.5)
Chloride: 93 mEq/L — ABNORMAL LOW (ref 96–112)
Creatinine, Ser: 3.31 mg/dL — ABNORMAL HIGH (ref 0.50–1.10)
GFR calc Af Amer: 14 mL/min — ABNORMAL LOW (ref >90)
GFR calc non Af Amer: 12 mL/min — ABNORMAL LOW (ref >90)
Glucose, Bld: 132 mg/dL — ABNORMAL HIGH (ref 70–99)
Potassium: 3.9 mEq/L (ref 3.7–5.3)
Sodium: 132 mEq/L — ABNORMAL LOW (ref 137–147)

## 2013-12-08 NOTE — Progress Notes (Signed)
Patient ID: Claudia [name], female   DOB: [birth year], 80 y.o.   MRN: [id number] TCTS DAILY ICU PROGRESS NOTE                   [address]            [place] [address]          [phone]   30 Days Post-Op Procedure(s) (LRB): CORONARY ARTERY BYPASS GRAFTING (CABG), on pump, times two, using left internal mammary artery, cryo saphenous vein. (N/A) INTRAOPERATIVE TRANSESOPHAGEAL ECHOCARDIOGRAM (N/A) MITRAL VALVE (MV) REPLACEMENT (N/A)  Total Length of Stay:  LOS: 33 days   Subjective: Awake up to chair and talking  Objective: Vital signs in last 24 hours: Temp:  [97.7 F (36.5 C)-98.7 F (37.1 C)] 98 F (36.7 C) (10/04 0700) Pulse Rate:  [87-107] 90 (10/04 0900) Cardiac Rhythm:  [-] Normal sinus rhythm (10/04 0755) Resp:  [12-22] 17 (10/04 0900) BP: (107-148)/(49-75) 133/54 mmHg (10/04 0900) SpO2:  [93 %-99 %] 98 % (10/04 0900) Weight:  [185 lb 3 oz (84 kg)] 185 lb 3 oz (84 kg) (10/04 0600)  Filed Weights   12/06/13 0300 12/07/13 0500 12/08/13 0600  Weight: 187 lb 13.3 oz (85.2 kg) 185 lb 6.5 oz (84.1 kg) 185 lb 3 oz (84 kg)    Weight change: -3.5 oz (-0.1 kg)   Hemodynamic parameters for last 24 hours:    Intake/Output from previous day: 10/03 0701 - 10/04 0700 In: 2302.5 [P.O.:640; I.V.:1382.5; IV Piggyback:280] Out: 2050 [Urine:2050]  Intake/Output this shift: Total I/O In: 348.2 [P.O.:240; I.V.:108.2] Out: 200 [Urine:200]  Current Meds: Scheduled Meds: . amiodarone  200 mg Oral Daily  . citalopram  20 mg Oral Daily  . darbepoetin (ARANESP) injection - NON-DIALYSIS  100 mcg Subcutaneous Q Wed-1800  . feeding supplement (GLUCERNA SHAKE)  237 mL Oral TID WC  . furosemide  40 mg Intravenous BID  . insulin aspart  0-24 Units Subcutaneous 6 times per day  . lacosamide (VIMPAT) IV  150 mg Intravenous Q12H  . magic mouthwash  5 mL Oral TID  . neomycin-bacitracin-polymyxin   Topical Daily  . nystatin cream   Topical TID  . pantoprazole  40  mg Oral Daily  . piperacillin-tazobactam (ZOSYN)  IV  2.25 g Intravenous 4 times per day  . sodium chloride  10-40 mL Intracatheter Q12H  . warfarin  2 mg Oral q1800  . Warfarin - Physician Dosing Inpatient   Does not apply q1800   Continuous Infusions: . sodium chloride 50 mL/hr (12/08/13 0545)  . DOPamine 2.5 mcg/kg/min (12/08/13 0900)   PRN Meds:.glucose-Vitamin C, Influenza vac split quadrivalent PF, levalbuterol, metoprolol, ondansetron (ZOFRAN) IV, promethazine, RESOURCE THICKENUP CLEAR, sodium chloride  General appearance: alert, cooperative and appears older than stated age Neurologic: intact Heart: irregularly irregular rhythm Lungs: diminished breath sounds bibasilar Abdomen: soft, non-tender; bowel sounds normal; no masses,  no organomegaly Extremities: extremities normal, atraumatic, no cyanosis or edema and Homans sign is negative, no sign of DVT Wound: intact  Lab Results: CBC:  Recent Labs  12/07/13 0337 12/08/13 0333  WBC 5.9 5.8  HGB 10.6* 10.9*  HCT 33.2* 33.6*  PLT 219 207   BMET:   Recent Labs  12/07/13 0337 12/08/13 0333  NA 135* 132*  K 3.8 3.9  CL 94* 93*  CO2 25 24  GLUCOSE 104* 132*  BUN 48* 47*  CREATININE 3.35* 3.31*  CALCIUM 8.7 8.7    PT/INR:   Recent Labs  12/08/13 0333  LABPROT 23.2*  INR 2.06*   Radiology: Ct Head Wo Contrast  12/06/2013   CLINICAL DATA:  Mental status change. Subsequent encounter for mental status changes beginning yesterday.  EXAM: CT HEAD WITHOUT CONTRAST  TECHNIQUE: Contiguous axial images were obtained from the base of the skull through the vertex without intravenous contrast.  COMPARISON:  CT head without contrast 12/04/2013.  FINDINGS: A focal area of hypoattenuation versus volume-averaging along the inferior aspect of the left occipital lobe is stable. There is no hemorrhage or mass lesion. The basal ganglia are intact. There is diffuse decreased gray-white differentiation along the cortex over the  convexities bilaterally compared to the prior studies  The paranasal sinuses and mastoid air cells are clear. Atherosclerotic calcifications are again noted.  IMPRESSION: 1. Progressive diffuse loss of gray-white differentiation over the cerebral convexities bilaterally. This raises concern for diffuse hypoxia. If the patient remains unresponsive, nuclear medicine cerebral fusion exam may be useful for further evaluation. 2. The basal ganglia are preserved. 3. No evidence for hemorrhage. 4. Hypoattenuation on the left likely represents partial volume averaging versus infarct along the inferior parietal lobe.   Electronically Signed   By: Lawrence Santiago M.D.   On: 12/06/2013 11:31   Dg Chest Port 1 View  12/07/2013   CLINICAL DATA:  History of CHF.  Status post CABG.  EXAM: PORTABLE CHEST - 1 VIEW  COMPARISON:  12/06/2013  FINDINGS: Right internal jugular line terminates at the cavoatrial junction. Prior median sternotomy. Mitral valve repair. Small bilateral pleural effusions. New or increased. No pneumothorax. Interstitial edema is minimally increased, moderate. Worsened bibasilar airspace disease.  IMPRESSION: Overall worsened aeration, with increased interstitial edema and new or increased bilateral pleural effusions.  Bibasilar Airspace disease, likely atelectasis.   Electronically Signed   By: Abigail Miyamoto M.D.   On: 12/07/2013 10:20   Lab Results  Component Value Date   INR 2.06* 12/08/2013   INR 1.91* 12/06/2013   INR 2.28* 12/04/2013   Chronic Kidney Disease    Stage IV   GFR 15-29    Lab Results  Component Value Date   CREATININE 3.31* 12/08/2013   Estimated Creatinine Clearance: 15.4 ml/min (by C-G formula based on Cr of 3.31).  Assessment/Plan: S/P Procedure(s) (LRB): CORONARY ARTERY BYPASS GRAFTING (CABG), on pump, times two, using left internal mammary artery, cryo saphenous vein. (N/A) INTRAOPERATIVE TRANSESOPHAGEAL ECHOCARDIOGRAM (N/A) MITRAL VALVE (MV) REPLACEMENT (N/A) Back in  afib- yesterday  Resumed  Cordarone now in sinus  Neurology has changed recommendation for antibiotics -changed  to zosyn On coumadin Stage 4 ckd   Claudia [name] B 12/08/2013 10:14 AM

## 2013-12-08 NOTE — Plan of Care (Signed)
Problem: Phase III - Recovery through Discharge Goal: Activity Progressed Outcome: Not Met (add Reason) Pt extremely deconditioned post-op requiring max assist for transfer to chair; PT/OT consults in place. 12/02/13: returned to 2s from 2w with r/o cva, neurology consulted.

## 2013-12-08 NOTE — Progress Notes (Signed)
[facility] ROUNDING NOTE   Subjective:   Interval History: improved today  Objective:  Vital signs in last 24 hours:  Temp:  [97.8 F (36.6 C)-98.7 F (37.1 C)] 97.9 F (36.6 C) (10/04 1100) Pulse Rate:  [87-107] 94 (10/04 1300) Resp:  [12-22] 13 (10/04 1300) BP: (113-148)/(50-75) 148/59 mmHg (10/04 1300) SpO2:  [93 %-100 %] 99 % (10/04 1300) Weight:  [84 kg (185 lb 3 oz)] 84 kg (185 lb 3 oz) (10/04 0600)  Weight change: -0.1 kg (-3.5 oz) Filed Weights   12/06/13 0300 12/07/13 0500 12/08/13 0600  Weight: 85.2 kg (187 lb 13.3 oz) 84.1 kg (185 lb 6.5 oz) 84 kg (185 lb 3 oz)    Intake/Output: I/O last 3 completed shifts: In: 2932.6 [P.O.:640; I.V.:1977.6; IV Piggyback:315] Out: 3125 [WHQPR:9163]   Intake/Output this shift:  Total I/O In: 708.7 [P.O.:240; I.V.:378.7; IV Piggyback:90] Out: 200 [Urine:200]  General: Obese female, alert, cooperative, NAD. On 24 hour EEG.  HEENT: PERRL, EOMI. Moist mucus membranes Neck: Full range of motion without pain, supple, no lymphadenopathy or carotid bruits. RIJ CVL.  Lungs: Clear to ascultation bilaterally, normal work of respiration, no wheezes, rales, rhonchi.  Heart: RRR, no murmurs, gallops, or rubs Abdomen: Soft, non-tender, non-distended, BS +  Extremities: +Trace pitting edema.  Neurologic: Alert & oriented x3, cranial nerves II-XII intact, strength grossly intact, sensation intact to light    a   Basic Metabolic Panel:  Recent Labs Lab 12/05/13 0335 12/06/13 0425 12/06/13 0909 12/06/13 0927 12/07/13 0337 12/08/13 0333  NA 133* 134* 135* 133* 135* 132*  K 3.8 4.0 4.1 3.8 3.8 3.9  CL 92* 93* 94* 97 94* 93*  CO2 27 27 24   --  25 24  GLUCOSE 103* 127* 149* 151* 104* 132*  BUN 50* 51* 50* 44* 48* 47*  CREATININE 3.32* 3.55* 3.50* 3.50* 3.35* 3.31*  CALCIUM 8.4 8.8 9.0  --  8.7 8.7    Liver Function Tests:  Recent Labs Lab 12/03/13 0337 12/06/13 0909  AST 12 15  ALT 10 11  ALKPHOS 69 77   BILITOT 0.4 0.5  PROT 6.3 6.4  ALBUMIN 2.5* 2.8*   No results found for this basename: LIPASE, AMYLASE,  in the last 168 hours  Recent Labs Lab 12/04/13 0500 12/06/13 0928  AMMONIA 22 22    CBC:  Recent Labs Lab 12/04/13 0500 12/05/13 0335 12/06/13 0425 12/06/13 0927 12/07/13 0337 12/08/13 0333  WBC 6.1 6.2 5.5  --  5.9 5.8  HGB 10.7* 9.7* 10.9* 11.9* 10.6* 10.9*  HCT 32.8* 30.1* 33.6* 35.0* 33.2* 33.6*  MCV 87.0 87.0 87.0  --  87.1 87.0  PLT 229 202 219  --  219 207    Cardiac Enzymes: No results found for this basename: CKTOTAL, CKMB, CKMBINDEX, TROPONINI,  in the last 168 hours  BNP: No components found with this basename: POCBNP,   CBG:  Recent Labs Lab 12/07/13 1919 12/07/13 2352 12/08/13 0338 12/08/13 0726 12/08/13 1121  GLUCAP 103* 163* 136* 179* 75*    Microbiology: Results for orders placed during the hospital encounter of 11/05/13  MRSA PCR SCREENING     Status: None   Collection Time    11/05/13  1:28 PM      Result Value Ref Range Status   MRSA by PCR NEGATIVE  NEGATIVE Final   Comment:            The GeneXpert MRSA Assay (FDA     approved for NASAL specimens  only), is one component of a     comprehensive MRSA colonization     surveillance program. It is not     intended to diagnose MRSA     infection nor to guide or     monitor treatment for     MRSA infections.  SURGICAL PCR SCREEN     Status: None   Collection Time    11/06/13  8:13 AM      Result Value Ref Range Status   MRSA, PCR NEGATIVE  NEGATIVE Final   Staphylococcus aureus NEGATIVE  NEGATIVE Final   Comment:            The Xpert SA Assay (FDA     approved for NASAL specimens     in patients over 60 years of age),     is one component of     a comprehensive surveillance     program.  Test performance has     been validated by [employer] for patients greater     than or equal to 82 year old.     It is not intended     to diagnose infection nor to      guide or monitor treatment.  CULTURE, BLOOD (SINGLE)     Status: None   Collection Time    11/16/13  9:15 AM      Result Value Ref Range Status   Specimen Description BLOOD LEFT ARM   Final   Special Requests BOTTLES DRAWN AEROBIC ONLY 4CC   Final   Culture  Setup Time     Final   Value: 11/16/2013 15:20     Performed at [facility]   Culture     Final   Value: NO GROWTH 5 DAYS     Performed at [facility]   Report Status 11/22/2013 FINAL   Final  URINE CULTURE     Status: None   Collection Time    11/16/13  9:36 AM      Result Value Ref Range Status   Specimen Description URINE, CATHETERIZED   Final   Special Requests Normal   Final   Culture  Setup Time     Final   Value: 11/16/2013 19:34     Performed at [facility]     Final   Value: NO GROWTH     Performed at [facility]   Culture     Final   Value: NO GROWTH     Performed at [facility]   Report Status 11/17/2013 FINAL   Final  CULTURE, RESPIRATORY (NON-EXPECTORATED)     Status: None   Collection Time    11/21/13  9:00 AM      Result Value Ref Range Status   Specimen Description TRACHEAL ASPIRATE   Final   Special Requests Normal   Final   Gram Stain     Final   Value: MODERATE WBC PRESENT,BOTH PMN AND MONONUCLEAR     RARE SQUAMOUS EPITHELIAL CELLS PRESENT     NO ORGANISMS SEEN     Performed at [facility]   Culture     Final   Value: FEW SERRATIA MARCESCENS     Performed at [facility]   Report Status 11/26/2013 FINAL   Final   Organism ID, Bacteria SERRATIA MARCESCENS   Final  CULTURE, BLOOD (ROUTINE X 2)     Status: None   Collection Time  11/21/13  9:35 AM      Result Value Ref Range Status   Specimen Description BLOOD RIGHT ARM   Final   Special Requests BOTTLES DRAWN AEROBIC ONLY 5CC   Final   Culture  Setup Time     Final   Value: 11/21/2013 14:44     Performed at [facility]   Culture     Final   Value:  SERRATIA MARCESCENS     Note: Gram Stain Report Called to,Read Back By and Verified With: JESSIE SUTTER 11/23/13 AT 0445 RIDK     Performed at [facility]   Report Status 11/26/2013 FINAL   Final   Organism ID, Bacteria SERRATIA MARCESCENS   Final  CULTURE, BLOOD (ROUTINE X 2)     Status: None   Collection Time    11/21/13  9:40 AM      Result Value Ref Range Status   Specimen Description BLOOD RIGHT FOREARM   Final   Special Requests BOTTLES DRAWN AEROBIC ONLY 5CC   Final   Culture  Setup Time     Final   Value: 11/21/2013 14:43     Performed at [facility]   Culture     Final   Value: SERRATIA MARCESCENS     Note: SUSCEPTIBILITIES PERFORMED ON PREVIOUS CULTURE WITHIN THE LAST 5 DAYS.     Note: Gram Stain Report Called to,Read Back By and Verified With: JESSIE SUTTER 11/23/13 AT 39 RIDK     Performed at [facility]   Report Status 11/25/2013 FINAL   Final  URINE CULTURE     Status: None   Collection Time    11/21/13 10:30 AM      Result Value Ref Range Status   Specimen Description URINE, CATHETERIZED   Final   Special Requests has completed 10 days of Ancef Normal   Final   Culture  Setup Time     Final   Value: 11/21/2013 17:18     Performed at [facility]     Final   Value: 9,000 COLONIES/ML     Performed at [facility]   Culture     Final   Value: INSIGNIFICANT GROWTH     Performed at [facility]   Report Status 11/23/2013 FINAL   Final  CLOSTRIDIUM DIFFICILE BY PCR     Status: None   Collection Time    11/21/13  9:00 PM      Result Value Ref Range Status   C difficile by pcr NEGATIVE  NEGATIVE Final  BODY FLUID CULTURE     Status: None   Collection Time    11/22/13 11:02 AM      Result Value Ref Range Status   Specimen Description PLEURAL FLUID RIGHT   Final   Special Requests 60 ML FLUID   Final   Gram Stain     Final   Value: NO WBC SEEN     NO ORGANISMS SEEN     Performed at [facility]   Culture     Final   Value: NO GROWTH 3 DAYS     Performed at [facility]   Report Status 11/26/2013 FINAL   Final  URINE CULTURE     Status: None   Collection Time    12/01/13 11:26 AM      Result Value Ref Range Status   Specimen Description URINE, RANDOM   Final   Special  Requests NONE   Final   Culture  Setup Time     Final   Value: 12/01/2013 22:13     Performed at [facility]     Final   Value: 30,000 COLONIES/ML     Performed at [facility]   Culture     Final   Value: Multiple bacterial morphotypes present, none predominant. Suggest appropriate recollection if clinically indicated.     Performed at [facility]   Report Status 12/03/2013 FINAL   Final  WOUND CULTURE     Status: None   Collection Time    12/05/13  7:41 PM      Result Value Ref Range Status   Specimen Description WOUND   Final   Special Requests IV SITE RIGHT SUBCLAVIAN   Final   Gram Stain     Final   Value: NO WBC SEEN     NO SQUAMOUS EPITHELIAL CELLS SEEN     NO ORGANISMS SEEN     Performed at [facility]   Culture     Final   Value: NO GROWTH 2 DAYS     Performed at [facility]   Report Status 12/08/2013 FINAL   Final  CULTURE, BLOOD (ROUTINE X 2)     Status: None   Collection Time    12/06/13  1:35 PM      Result Value Ref Range Status   Specimen Description BLOOD RIGHT ANTECUBITAL   Final   Special Requests BOTTLES DRAWN AEROBIC AND ANAEROBIC 10CC   Final   Culture  Setup Time     Final   Value: 12/06/2013 17:55     Performed at [facility]   Culture     Final   Value:        BLOOD CULTURE RECEIVED NO GROWTH TO DATE CULTURE WILL BE HELD FOR 5 DAYS BEFORE ISSUING A FINAL NEGATIVE REPORT     Performed at [facility]   Report Status PENDING   Incomplete  CULTURE, BLOOD (ROUTINE X 2)     Status: None   Collection Time    12/06/13  1:45 PM      Result Value Ref Range Status   Specimen Description  BLOOD RIGHT HAND   Final   Special Requests BOTTLES DRAWN AEROBIC ONLY 10CC   Final   Culture  Setup Time     Final   Value: 12/06/2013 17:51     Performed at [facility]   Culture     Final   Value:        BLOOD CULTURE RECEIVED NO GROWTH TO DATE CULTURE WILL BE HELD FOR 5 DAYS BEFORE ISSUING A FINAL NEGATIVE REPORT     Performed at [facility]   Report Status PENDING   Incomplete  WOUND CULTURE     Status: None   Collection Time    12/06/13  2:57 PM      Result Value Ref Range Status   Specimen Description WOUND NECK   Final   Special Requests Normal   Final   Gram Stain     Final   Value: RARE WBC PRESENT,BOTH PMN AND MONONUCLEAR     NO SQUAMOUS EPITHELIAL CELLS SEEN     NO ORGANISMS SEEN     Performed at [facility]   Culture     Final   Value: NO GROWTH 2 DAYS     Performed at [facility]   Report Status 12/08/2013 FINAL   Final    Coagulation Studies:  Recent Labs  12/06/13 0425 12/08/13 0333  LABPROT 21.9* 23.2*  INR 1.91* 2.06*    Urinalysis: No results found for this basename: COLORURINE, APPERANCEUR, LABSPEC, PHURINE, GLUCOSEU, HGBUR, BILIRUBINUR, KETONESUR, PROTEINUR, UROBILINOGEN, NITRITE, LEUKOCYTESUR,  in the last 72 hours    Imaging: Dg Chest Port 1 View  12/07/2013   CLINICAL DATA:  History of CHF.  Status post CABG.  EXAM: PORTABLE CHEST - 1 VIEW  COMPARISON:  12/06/2013  FINDINGS: Right internal jugular line terminates at the cavoatrial junction. Prior median sternotomy. Mitral valve repair. Small bilateral pleural effusions. New or increased. No pneumothorax. Interstitial edema is minimally increased, moderate. Worsened bibasilar airspace disease.  IMPRESSION: Overall worsened aeration, with increased interstitial edema and new or increased bilateral pleural effusions.  Bibasilar Airspace disease, likely atelectasis.   Electronically Signed   By: Abigail Miyamoto M.D.   On: 12/07/2013 10:20     Medications:   . sodium  chloride 50 mL/hr at 12/08/13 1304  . DOPamine 2.5 mcg/kg/min (12/08/13 1400)   . amiodarone  200 mg Oral Daily  . citalopram  20 mg Oral Daily  . darbepoetin (ARANESP) injection - NON-DIALYSIS  100 mcg Subcutaneous Q Wed-1800  . feeding supplement (GLUCERNA SHAKE)  237 mL Oral TID WC  . furosemide  40 mg Intravenous BID  . insulin aspart  0-24 Units Subcutaneous 6 times per day  . lacosamide (VIMPAT) IV  150 mg Intravenous Q12H  . magic mouthwash  5 mL Oral TID  . neomycin-bacitracin-polymyxin   Topical Daily  . nystatin cream   Topical TID  . pantoprazole  40 mg Oral Daily  . piperacillin-tazobactam (ZOSYN)  IV  2.25 g Intravenous 4 times per day  . sodium chloride  10-40 mL Intracatheter Q12H  . warfarin  2 mg Oral q1800  . Warfarin - Physician Dosing Inpatient   Does not apply q1800   glucose-Vitamin C, Influenza vac split quadrivalent PF, levalbuterol, metoprolol, ondansetron (ZOFRAN) IV, promethazine, RESOURCE THICKENUP CLEAR, sodium chloride  Assessment/ Plan:  Renal- Decreased lasix to 40mg  BID  Acute Encephalopathy- Seizure identified by EEG, started on Vimpat. Some question of metabolic encephalopathy as etiology. Neurology following.  Hypertension/volume- Improved, at baseline. Continue diuretic program as above. Follow daily weights, UOP.  Serratia Bacteremia-Cefepime on 11/25/13.  Anemia- Hb 10.7  Hypokalemia- 3.8 this AM. Continue to monitor.  Hyponatremia- Stable and improved  S/p CABG/MVR  AFib on heparin, BB, amio po- In NSR, on po amio  Nothing to add. Will sign off. Patient will most likely have CKD stage 4 with no plans prepare for dialysis at this time. I would suggest renal follow up as an outpatient once the acute problems have resolved and patient has recovered from surgery.   LOS: [address] W @TODAY @2 :16 PM

## 2013-12-08 NOTE — Progress Notes (Signed)
Patient ID: Claudia [name], female   DOB: Apr 22, 1931, 80 y.o.   MRN: [id number] EVENING ROUNDS NOTE :     [address]       [place] [address]             [phone]                 30 Days Post-Op Procedure(s) (LRB): CORONARY ARTERY BYPASS GRAFTING (CABG), on pump, times two, using left internal mammary artery, cryo saphenous vein. (N/A) INTRAOPERATIVE TRANSESOPHAGEAL ECHOCARDIOGRAM (N/A) MITRAL VALVE (MV) REPLACEMENT (N/A)  Total Length of Stay:  LOS: 33 days  BP 140/57  Pulse 89  Temp(Src) 97.8 F (36.6 C) (Oral)  Resp 15  Ht 5\' 7"  (1.702 m)  Wt 185 lb 3 oz (84 kg)  BMI 29.00 kg/m2  SpO2 97%  .Intake/Output     10/03 0701 - 10/04 0700 10/04 0701 - 10/05 0700   P.O. 640 360   I.V. (mL/kg) 1382.5 (16.5) 591 (7)   IV Piggyback 280 140   Total Intake(mL/kg) 2302.5 (27.4) 1091 (13)   Urine (mL/kg/hr) 2050 (1) 1230 (1.2)   Total Output 2050 1230   Net +252.5 -139        Stool Occurrence  1 x     . sodium chloride 50 mL/hr at 12/08/13 1304  . DOPamine 2.5 mcg/kg/min (12/08/13 1800)     Lab Results  Component Value Date   WBC 5.8 12/08/2013   HGB 10.9* 12/08/2013   HCT 33.6* 12/08/2013   PLT 207 12/08/2013   GLUCOSE 132* 12/08/2013   CHOL 125 12/03/2012   TRIG 42 12/03/2012   HDL 72 12/03/2012   LDLCALC 45 12/03/2012   ALT 11 12/06/2013   AST 15 12/06/2013   NA 132* 12/08/2013   K 3.9 12/08/2013   CL 93* 12/08/2013   CREATININE 3.31* 12/08/2013   BUN 47* 12/08/2013   CO2 24 12/08/2013   TSH 0.596 12/02/2012   INR 2.06* 12/08/2013   HGBA1C 7.2* 11/06/2013   Holding sinus, neuro intact, very fine rash- observe may need to change antibiotic  Grace Isaac MD  Beeper [phone] Office [phone] 12/08/2013 6:48 PM

## 2013-12-08 NOTE — Progress Notes (Addendum)
Subjective: Continues to be improved.   Exam: Filed Vitals:   12/08/13 0900  BP: 133/54  Pulse: 90  Temp:   Resp: 17   Gen: In bed, NAD MS: Awake, alert, oriented to month, place, year CH:YIFOY, EOMI Motor: moves all extremities well, bilateral proximal weakness which I suspect is deconditioning Sensory:intact to LT    Impression: 80 yo F with episodes of confusion and aphasia with concerning EEG pattern. I suspect that this did represent NCSE. Given her renal failure and fairly large doses of cefipime, I suspect cefepime induced neurotoxicity as an etiology. Her improvement with treatment is reassuring. I would favor increasing her vimpat slightly for the time being.  Amiodarone can casue neurotoxicty, but usually this manifests as ataxia and tremor, and I am not familiar with it causing NCSE.   Recommendations: 1) Agree with change to zosyn.  2) vimpat 150mg  BID, will continue this for another 7 daysm then stop.  3) will continue to follow.    Roland Rack, MD [facility] [phone]  If 7pm- 7am, please page neurology on call as listed in [place].

## 2013-12-09 DIAGNOSIS — I4891 Unspecified atrial fibrillation: Secondary | ICD-10-CM

## 2013-12-09 DIAGNOSIS — Z953 Presence of xenogenic heart valve: Secondary | ICD-10-CM

## 2013-12-09 LAB — CBC
HCT: 33.9 % — ABNORMAL LOW (ref 36.0–46.0)
Hemoglobin: 11 g/dL — ABNORMAL LOW (ref 12.0–15.0)
MCH: 27.9 pg (ref 26.0–34.0)
MCHC: 32.4 g/dL (ref 30.0–36.0)
MCV: 86 fL (ref 78.0–100.0)
Platelets: 209 10*3/uL (ref 150–400)
RBC: 3.94 MIL/uL (ref 3.87–5.11)
RDW: 15.6 % — ABNORMAL HIGH (ref 11.5–15.5)
WBC: 5.3 10*3/uL (ref 4.0–10.5)

## 2013-12-09 LAB — GLUCOSE, CAPILLARY
GLUCOSE-CAPILLARY: 203 mg/dL — AB (ref 70–99)
GLUCOSE-CAPILLARY: 195 mg/dL — AB (ref 70–99)
GLUCOSE-CAPILLARY: 250 mg/dL — AB (ref 70–99)
Glucose-Capillary: 161 mg/dL — ABNORMAL HIGH (ref 70–99)
Glucose-Capillary: 128 mg/dL — ABNORMAL HIGH (ref 70–99)

## 2013-12-09 LAB — MRSA PCR SCREENING: MRSA BY PCR: NEGATIVE

## 2013-12-09 MED ORDER — LACOSAMIDE 200 MG/20ML IV SOLN: 100.0000 mg | Freq: Two times a day (BID) | INTRAVENOUS | Status: DC

## 2013-12-09 MED ORDER — DOPAMINE-DEXTROSE 3.2-5 MG/ML-% IV SOLN: 2.5000 ug/kg/min | INTRAVENOUS | Status: DC

## 2013-12-09 MED ORDER — FUROSEMIDE 80 MG PO TABS
80.0000 mg | ORAL_TABLET | Freq: Every day | ORAL | Status: DC
  Administered 2013-12-10 – 2013-12-13 (×4): 80 mg via ORAL
  Filled 2013-12-09 (×5): qty 1

## 2013-12-09 MED ORDER — METOPROLOL TARTRATE 12.5 MG HALF TABLET
12.5000 mg | ORAL_TABLET | Freq: Two times a day (BID) | ORAL | Status: DC
  Administered 2013-12-09 – 2013-12-13 (×8): 12.5 mg via ORAL
  Filled 2013-12-09 (×10): qty 1

## 2013-12-09 MED ORDER — SODIUM CHLORIDE 0.9 % IV SOLN
100.0000 mg | Freq: Two times a day (BID) | INTRAVENOUS | Status: DC
  Administered 2013-12-09 – 2013-12-12 (×7): 100 mg via INTRAVENOUS
  Filled 2013-12-09 (×13): qty 10

## 2013-12-09 NOTE — Progress Notes (Signed)
Physical Therapy Treatment Patient Details Name: Claudia [name] MRN: [id number] DOB: Jan 26, 1932 Today's Date: 12/09/2013    History of Present Illness 80 y.o. female admitted to [facility] on 11/05/13 with chest pain.  She is now s/p emergency CABG x 2 and MVR on 11/09/13.  PMHx includes HTN, MI, DM.  Post-op course complicated by possible seizure activity.  Continuous EEG in progress (12/03/13).     PT Comments    *Assisted pt from recliner to bed, she walked 5' with +2 assist. She has poor standing balance with a posterior lean, standing tolerance limited by dizziness. Pt is generally deconditioned.**  Follow Up Recommendations  SNF     Equipment Recommendations  None recommended by PT    Recommendations for Other Services OT consult     Precautions / Restrictions Precautions Precautions: Fall;Sternal Precaution Comments: orthostatic Restrictions Other Position/Activity Restrictions: sternal precautions    Mobility  Bed Mobility Overal bed mobility: Needs Assistance Bed Mobility: Rolling;Sit to Supine Rolling: Mod assist     Sit to supine: +2 for physical assistance;Max assist   General bed mobility comments: assist for trunk and BLEs into bed, cues for technique with rolling  Transfers Overall transfer level: Needs assistance Equipment used: Rolling walker (2 wheeled) Transfers: Sit to/from Stand Sit to Stand: +2 physical assistance;Max assist         General transfer comment: Two person mod assist to support trunk over weak legs.  Pt with significant posterior lean.  She did report lightheadedness in both standing (previous therapy notes warned about orthostasis-unable to check standing BP due to poor standing tolerance/weakness).   Ambulation/Gait Ambulation/Gait assistance: +2 physical assistance;Mod assist Ambulation Distance (Feet): 5 Feet Assistive device: Rolling walker (2 wheeled) Gait Pattern/deviations: Shuffle;Narrow base of support Gait velocity: slow    General Gait Details: assist for balance due to posterior lean, narrow BOS, cues for positioning in RW as pt tends to push it too far infront of her, cues for safety as pt attempted to sit on bed prior to reaching it   Stairs            Wheelchair Mobility    Modified Rankin (Stroke Patients Only)       Balance           Standing balance support: Bilateral upper extremity supported Standing balance-Leahy Scale: Poor Standing balance comment: posterior lean standing, pt unable to come to center without assist                    Cognition Arousal/Alertness: Awake/alert Behavior During Therapy: WFL for tasks assessed/performed Overall Cognitive Status: Impaired/Different from baseline Area of Impairment: Attention;Problem solving   Current Attention Level: Sustained         Problem Solving: Slow processing;Decreased initiation;Requires verbal cues;Requires tactile cues      Exercises General Exercises - Lower Extremity Ankle Circles/Pumps: AROM;Both;10 reps;Supine Heel Slides: AAROM;Both;10 reps;Supine Hip ABduction/ADduction: AAROM;Both;10 reps;Supine    General Comments        Pertinent Vitals/Pain Pain Assessment: No/denies pain    Home Living                      Prior Function            PT Goals (current goals can now be found in the care plan section) Acute Rehab PT Goals Patient Stated Goal: Return home and see Cassie (grandaughter) PT Goal Formulation: With patient Time For Goal Achievement: 12/19/13 Potential to Achieve Goals: Fair Progress  towards PT goals: Not progressing toward goals - comment (fatigues quickly, dizziness in standing limits activity tolerance)    Frequency  Min 2X/week    PT Plan Current plan remains appropriate    Co-evaluation             End of Session Equipment Utilized During Treatment: Gait belt Activity Tolerance: Patient limited by fatigue Patient left: with call bell/phone within  reach;in bed     Time: 0940-1005 PT Time Calculation (min): 25 min  Charges:  $Therapeutic Exercise: 8-22 mins $Therapeutic Activity: 8-22 mins                    G Codes:      Blondell Reveal Kistler 12/09/2013, 10:15 AM

## 2013-12-09 NOTE — Progress Notes (Addendum)
NUTRITION CONSULT/FOLLOW UP  Intervention:  Recommend diet liberalization to Regular -- done Continue Glucerna Shake po TID, each supplement provides 350 kcal and 13 grams of protein RD to follow for nutrition care plan  Nutrition Dx: Increased nutrient needs related to post-op healing as evidenced by estimated nutrition needs, ongoing  Goal: Pt to meet >/= 90% of their estimated nutrition needs, progressing  ASSESSMENT: 80 year old female with extensive cardiac history, admitted on 9/1 with chest pain.   Patient s/p procedures 9/5: CORONARY ARTERY BYPASS GRAFTING x 2 MITRAL VALVE REPLACEMENT   Patient extubated 9/6.  RD consulted for food preferences.  Patient sleepy upon RD visit.  Still feeling nauseous.  Appetite a bit improved.  PO intake ~ 50% this AM.  Nutritional Ambassador is taking food preferences.  Pt however with limited options given Heart Healthy/Carbohydrate Modified diet order.  Spoke with RN.  Diet order changed to Regular.  Encouraged continuance of Glucerna Shake between meals.  Height: Ht Readings from Last 1 Encounters:  11/05/13 5\' 7"  (1.702 m)    Weight: Wt Readings from Last 1 Encounters:  12/09/13 181 lb 3.5 oz (82.2 kg)    BMI:  Body mass index is 28.38 kg/(m^2).    Estimated Nutritional Needs: Kcal: 1700-1900 Protein: 90-100 gm  Fluid: 1.7-1.9 L  Skin: surgical incisions to legs and chest  Diet Order: Regular   Intake/Output Summary (Last 24 hours) at 12/09/13 1158 Last data filed at 12/09/13 1100  Gross per 24 hour  Intake 1763.62 ml  Output   3425 ml  Net -1661.38 ml    Labs:   Recent Labs Lab 12/06/13 0909 12/06/13 0927 12/07/13 0337 12/08/13 0333  NA 135* 133* 135* 132*  K 4.1 3.8 3.8 3.9  CL 94* 97 94* 93*  CO2 24  --  25 24  BUN 50* 44* 48* 47*  CREATININE 3.50* 3.50* 3.35* 3.31*  CALCIUM 9.0  --  8.7 8.7  GLUCOSE 149* 151* 104* 132*    CBG (last 3)   Recent Labs  12/08/13 2323 12/09/13 0355  12/09/13 0718  GLUCAP 142* 161* 128*    Scheduled Meds: . citalopram  20 mg Oral Daily  . darbepoetin (ARANESP) injection - NON-DIALYSIS  100 mcg Subcutaneous Q Wed-1800  . feeding supplement (GLUCERNA SHAKE)  237 mL Oral TID WC  . furosemide  80 mg Oral Daily  . insulin aspart  0-24 Units Subcutaneous 6 times per day  . lacosamide (VIMPAT) IV  100 mg Intravenous Q12H  . magic mouthwash  5 mL Oral TID  . metoprolol tartrate  12.5 mg Oral BID  . neomycin-bacitracin-polymyxin   Topical Daily  . nystatin cream   Topical TID  . pantoprazole  40 mg Oral Daily  . sodium chloride  10-40 mL Intracatheter Q12H  . warfarin  2 mg Oral q1800  . Warfarin - Physician Dosing Inpatient   Does not apply q1800    Continuous Infusions: . sodium chloride 50 mL/hr at 12/09/13 0735  . DOPamine 1.907 mcg/kg/min (12/09/13 1019)    Past Medical History  Diagnosis Date  . GERD (gastroesophageal reflux disease)   . PUD (peptic ulcer disease)   . Anemia   . Hypertension   . S/P endoscopy Aug 2011    3 superficial gastric ulcers, NSAID-induced  . S/P colonoscopy Sept 2011    left-sided diverticula, tubular adenoma  . Coronary artery disease   . Shortness of breath   . Diabetes mellitus     insulin  dependent  . Headache(784.0)     rare migraines  . Cancer     hx of skin cancer  . Arthritis   . Osteopenia   . Hypercholesterolemia   . SIADH (syndrome of inappropriate ADH production)   . CHF (congestive heart failure)   . Myocardial infarction 2013    Past Surgical History  Procedure Laterality Date  . Tonsillectomy    . Appendectomy    . Eye surgery      cataracts  . Cardiac stents  07/19/2011  . Esophagogastroduodenoscopy  10/16/09    normal without barrett's/three superficial gastric ulcers  . Colonoscopy  12/04/09    normal rectum/left-sided diverticula  . Joint replacement  arthroscopy to knee  . Toe amputation Left 2013    left second toe amputation  . Cardiac catheterization   01/22/2013  . Coronary angioplasty  07/2011  . Coronary artery bypass graft N/A 11/08/2013    Procedure: CORONARY ARTERY BYPASS GRAFTING (CABG), on pump, times two, using left internal mammary artery, cryo saphenous vein.;  Surgeon: Ivin Poot, MD;  Location: [place];  Service: Open Heart Surgery;  Laterality: N/A;  LIMA-LAD CRYOVEIN -OM  . Intraoperative transesophageal echocardiogram N/A 11/08/2013    Procedure: INTRAOPERATIVE TRANSESOPHAGEAL ECHOCARDIOGRAM;  Surgeon: Ivin Poot, MD;  Location: [facility];  Service: Open Heart Surgery;  Laterality: N/A;  . Mitral valve replacement N/A 11/08/2013    Procedure: MITRAL VALVE (MV) REPLACEMENT;  Surgeon: Ivin Poot, MD;  Location: [place];  Service: Open Heart Surgery;  Laterality: N/A;  #25 MAGNA MITRAL EASE    Arthur Holms, RD, LDN Pager #: [phone] After-Hours Pager #: [phone]

## 2013-12-09 NOTE — Progress Notes (Signed)
[employer] will not approve LTAC per liaison - pt must be on a vent 21 days with 3 failed weans to qualify for LTAC.  They also will not approve CIR.  CSW following for SNF placement for rehab.

## 2013-12-09 NOTE — Consult Note (Addendum)
WOC wound consult note Reason for Consult: Consult requested for previous central line site. Wound type: Full thickness Measurement: .2X.2X1cm Wound BZM:CEYEMV opening, difficult to visualize, appears to be beefy red Drainage (amount, consistency, odor) Small amt red drainage on previous packing strip, no odor Periwound: Intact skin surrounding. Dressing procedure/placement/frequency: Was previously packed with Iodoform packing strip. Change to Aquacel to absorb drainage and provide antimicrobial benefits.  Left inner groin remains with unchanged from previous assessment; appears to be over the site of a previous vascular procedure, appearance consistent with frequent moisture and candidiasis. Measurement: .5X1X.1cm  Wound bed: 90% yellow, 10% red  Drainage (amount, consistency, odor) mod amt yellow drainage, no odor  Periwound: Intact skin surrounding is red and moist in groin fold yellow fissure; cardiac team is providing plan of care for candidiasis in this area.  Left heel with previous deep tissue injury which is slowly resolving.  2X1cm area of dark red intact skin, no further areas of dark purple, blistering, or open wounds.  Continue to float to reduce pressure and promote healing. Please re-consult if further assistance is needed.  Thank-you,  Julien Girt MSN, [place], [place], [place], [place]

## 2013-12-09 NOTE — Progress Notes (Signed)
TCTS BRIEF SICU PROGRESS NOTE  31 Days Post-Op  S/P Procedure(s) (LRB): CORONARY ARTERY BYPASS GRAFTING (CABG), on pump, times two, using left internal mammary artery, cryo saphenous vein. (N/A) INTRAOPERATIVE TRANSESOPHAGEAL ECHOCARDIOGRAM (N/A) MITRAL VALVE (MV) REPLACEMENT (N/A)   Stable day Now stable off Dopamine O2 sats 100% on RA UOP adequate  Plan: Continue current plan  Cilicia Borden H 12/09/2013 7:14 PM

## 2013-12-09 NOTE — Discharge Summary (Signed)
Physician Discharge Summary  Patient ID: Claudia [name] MRN: [id number] DOB/AGE: 1931/08/18 80 y.o.  Admit date: 11/05/2013 Discharge date: 12/13/2013  Admission Diagnoses:  Patient Active Problem List   Diagnosis Date Noted  . Unstable angina pectoris 11/05/2013  . ACS (acute coronary syndrome) 06/26/2013  . NSTEMI (non-ST elevated myocardial infarction) 06/26/2013  . CHF (congestive heart failure) 06/25/2012  . Hypercholesterolemia   . Osteopenia   . Unstable angina 12/21/2011  . Osteomyelitis of toe 10/26/2011  . ARF (acute renal failure) 10/26/2011  . Hyperkalemia 10/26/2011  . Diarrhea 07/29/2011  . Hyponatremia 07/28/2011  . Nausea and vomiting 07/28/2011  . Benign hypertension 07/28/2011  . DM (diabetes mellitus), type 2 07/28/2011  . CAD (coronary artery disease), native coronary artery 07/20/2011  . GERD 12/28/2009  . PUD, HX OF 12/28/2009  . COLONIC POLYPS, ADENOMATOUS, HX OF 12/28/2009  . ANEMIA 10/22/2009    Discharge Diagnoses:   Patient Active Problem List   Diagnosis Date Noted  . S/P mitral valve replacement with bioprosthetic valve 12/09/2013  . Atrial fibrillation 12/09/2013  . TIA (transient ischemic attack) 12/01/2013  . S/P CABG x 2 11/08/2013  . Unstable angina pectoris 11/05/2013  . ACS (acute coronary syndrome) 06/26/2013  . NSTEMI (non-ST elevated myocardial infarction) 06/26/2013  . CHF (congestive heart failure) 06/25/2012  . Hypercholesterolemia   . Osteopenia   . Unstable angina 12/21/2011  . Osteomyelitis of toe 10/26/2011  . ARF (acute renal failure) 10/26/2011  . Hyperkalemia 10/26/2011  . Diarrhea 07/29/2011  . Hyponatremia 07/28/2011  . Nausea and vomiting 07/28/2011  . Benign hypertension 07/28/2011  . DM (diabetes mellitus), type 2 07/28/2011  . CAD (coronary artery disease), native coronary artery 07/20/2011  . GERD 12/28/2009  . PUD, HX OF 12/28/2009  . COLONIC POLYPS, ADENOMATOUS, HX OF 12/28/2009  . ANEMIA 10/22/2009    Discharged Condition: fair  History of Present Illness:  [name] is a 80 yo white obese female with known history of CAD and DM.  She has undergone multiple coronary angiograms with subsequent drug eluting stent placement to the Left Circumflex and Ramus in 2013.  She also had stents placed in 2015 for in stent stenosis.  The patient had been in her usual state of health until September of this year when she developed chest tightness.  She states that she had been using NTG which provided some relief.  She was evaluated by Dr. Jenna Luo at which time EKG changes were present in the lateral leads and due to ongoing chest discomfort she was referred to [facility] Emergency Department.  Upon arrival to the ED she was evaluated by Dr. Einar Gip who felt the patient was suffering from unstable angina pectoris.  Due to her chronic history of CAD it was felt she should undergo cardiac catheterization and she was admitted for further workup.    Hospital Course:   The patient was taken to the catheterization lab on 11/05/2013.  This showed severe 2 vessel CAD which was felt to be best treated with Coronary Bypass Grafting procedure.  TCTS consult was obtained and the patient was evaluated by Dr. Prescott Gum who was in agreement with proceeding with CABG.  However, after review of her Echocardiogram it was felt she would require intervention on her Mitral Valve.  The risks and benefits of the procedure were explained to the patient and her son and she was willing to proceed.  The patient had occasional episodic chest pain while in the hospital.  She was taken  to the operating room on 11/08/2013.  She underwent CABG x 2 utilizing LIMA to OM2 and Cryopreserved vein to the RCA.  She also underwent attempted Mitral Valve Repair followed by Mitral Valve Replacement utilizing a 25 mm Edwards Mitral Ease pericardial tissue valve.  Finally endoscopic saphenous vein harvest was attempted from both legs, however no suitable  conduit was obtained.  She tolerated the procedure well and was taken to the SICU in stable condition.  The patient has had a long and complicated hospital stay.  She required pressor support with Milrinone and Epinephrine.  She remained on Dopamine for a long period of time due to rising creatinine.  POD #1 the patient was extubated.  She developed Thrombocytopenia and was taken off all Heparin agents.  She developed Atrial Fibrillation and was treated with IV Amiodarone.  She continued to convert back and forth from sinus rhythm to Atrial Fibrillation.  She developed hypervolemia with weight loads of greater than 50 lbs above admission.  She was aggressively diuresed and Nephrology consult was obtained.  The patient's blood sugars have been controlled during the admission, however her insulin regimen was adjusted as needed.  She developed post operative Leukocytosis and fever.  Blood cultures were obtained and came back positive for Serratia Marcesans.  She was treated with 15 days of IV ABX for bacteremia.  Her central line site developed infection.  Wound consult was obtained and provided wound care.  She developed left sided pleural effusion and underwent successful drainage via Thoracentesis.  The patient ambulated with max assistance.  She was evaluated by PT who felt patient would require rehab at discharge.  She progressed and was transferred to the step down unit in stable condition.  However, within 24 hours of transfer patient developed confusion, aphasia and there was concern for a possible stroke. Neurology consult was obtained and patient was transferred back to the SICU for further monitoring.  The patient's symptoms continued to progress.  She was ruled out for acute CVA.  However, she would go unresponsive so EEG study was ordered.  There was initially concern for seizure like activity.  She was placed on VIMAT therapy, but later ruled out for seizures.  It was felt the patient was most likely  suffering from Metabolic Encephalopathy associated with ABX use.  She was monitored closely by Neurology and this has slowly been improving.  The patient's creatinine has been consistently running between 2.5-3.5.  She has good U/O and has been weaned off Dopamine.  The patient was now felt to be medically stable for transfer to the step down unit.  She has been transfused during her hospitalization for acute post operative anemia.  The patient is on Coumadin.  Her INR is 1.47 and she will  be discharged on 1 mg of coumadin daily.  The goal INR range of 1.5-2.0.  She is now very deconditioned and takes maximum assistance to ambulate.  Her insurance has declined placement at [facility] and Inpatient rehab centers.  She has been evaluated by PT and she will require SNF at discharge.  She is felt to be medically stable at this time.  She is felt surgically stable for discharge to SNF today.      Consults: nephrology and neurology  Significant Diagnostic Studies: angiography:  Hemodynamic data:  Left ventricular pressure was 118/9 with LVEDP of 16 mm mercury. Aortic pressure was 115/51 with a mean of 82 mm mercury. There was no pressure gradient across the aortic valve.  Left  ventricle: Not performed  Right coronary artery: severely diseased diffusely. Ostial 90% stenosis to 99% stenosis. The distal RCA fills by contralateral collaterals and also antegrade with TIMI 1 flow.  Left main coronary artery is large and normal.  Circumflex coronary artery: A large vessel giving origin small OM-1, Moderate OM-2. However the marginal's and the distal circumflex coronary artery are diffusely diseased and measure approximately 2.5 mm. The previously placed stents in the circumflex coronary artery revealed in-stent restenosis at the ostial circumflex has a high-grade 99% in-stent restenosis. The circumflex coronary artery previously placed stents are widely patent and the OM 2, distal circumflex and AV groove circumflex had  TIMI 3 flow.  LAD: LAD gives origin to several small diagonals with mild diffuse disease. LAD has mild luminal irregularity. No high-grade stenosis was evident.  Ramus intermediate: A severely diffusely diseased, and subtotally occluded at the previously placed mid ramus intermediate stent. TIMI 2 flow is evident in the ramus intermediate. This is unchanged from prior angiography in April 2015.   ECHO:  - Left ventricle: The cavity size was mildly reduced. Wall thickness was increased in a pattern of mild LVH. Hypertrophy was noted. Systolic function was normal. The estimated ejection fraction was in the range of 50% to 55%. Regional wall motion abnormalities cannot be excluded. The study is not technically sufficient to allow evaluation of LV diastolic function. - Ventricular septum: These changes are consistent with a post-thoracotomy state. - Mitral valve: Mildly to moderately calcified annulus. Prior procedures included surgical repair. A porcine bioprosthesis was present and functioning normally. The sewing ring appeared normal. There was no pannus formation. Reverbation artifact from MV prosthesis and also calcified AV noted. Mobility was mildly restricted. The findings are consistent with mild stenosis. Valve area by pressure half-time: 2.44 cm^2. - Left atrium: The atrium was mildly to moderately dilated. - Atrial septum: There was a probable, small secundum atrial septal defect. There is probable Eusthasian valve noted in the RA. no thrombus. Doppler showed a left-to-right shunt.  CT Scan: 1. Progressive diffuse loss of gray-white differentiation over the  cerebral convexities bilaterally. This raises concern for diffuse  hypoxia. If the patient remains unresponsive, nuclear medicine  cerebral fusion exam may be useful for further evaluation.  2. The basal ganglia are preserved.  3. No evidence for hemorrhage.  4. Hypoattenuation on the left likely represents partial volume   averaging versus infarct along the inferior parietal lobe.  Treatments: surgery:   1. Coronary artery bypass grafting x2 (left internal mammary to  obtuse marginal 2, saphenous vein graft to right coronary artery).  2. Attempted harvest of bilateral saphenous vein -- no suitable vein  for conduit was found.  3. Cryopreserved homograft vein utilized for conduit.  4. Attempted mitral valve ring annuloplasty repair followed by mitral  valve replacement with a 25-mm pericardial tissue valve [facility]  Mitral Magna Ease valve, serial [id number]).  5. Placement of femoral A-line for blood pressure monitoring.  Disposition: Stable SNF to do the following: 1. Please obtain vital signs at least one time daily 2.Please weigh the patient daily. If he or she continues to gain weight or develops lower extremity edema, contact the office at [phone]. 3. Ambulate patient at least three times daily and please use sternal precautions.  Discharge Medications:    Medication List    STOP taking these medications       aspirin EC 81 MG tablet     carvedilol 6.25 MG tablet  Commonly known as:  COREG     isosorbide mononitrate 60 MG 24 hr tablet  Commonly known as:  IMDUR     losartan 50 MG tablet  Commonly known as:  COZAAR     nitroGLYCERIN 0.4 MG SL tablet  Commonly known as:  NITROSTAT     ranolazine 500 MG 12 hr tablet  Commonly known as:  RANEXA     Vorapaxar Sulfate 2.08 MG Tabs      TAKE these medications       ACCU-CHEK NANO SMARTVIEW W/DEVICE Kit  Pt needs monitor, strips (box of 400/ 4 refill), lancets (box of 400/4 refills) She checks her BS qid (before meals & QHS) Dx: 250.00     acetaminophen 500 MG tablet  Commonly known as:  TYLENOL  Take 1,000 mg by mouth daily as needed (pain).     B-D ULTRAFINE III SHORT PEN 31G X 8 MM Misc  Generic drug:  Insulin Pen Needle  USE AS DIRECTED WITH LANTUS PEN     citalopram 20 MG tablet  Commonly known as:  CELEXA  Take 1  tablet (20 mg total) by mouth daily.     feeding supplement (GLUCERNA SHAKE) Liqd  Take 237 mLs by mouth 3 (three) times daily with meals.     furosemide 40 MG tablet  Commonly known as:  LASIX  Take 1 tablet (40 mg total) by mouth daily.     insulin aspart 100 UNIT/ML FlexPen  Commonly known as:  NOVOLOG FLEXPEN  - Inject 5-10 Units into the skin 3 (three) times daily with meals. 70-120=0    251-300=5  - 121-150=1  301-350=7  - 151-200=2  351-400=9  - 201-250=3  400 or >, call MD  -  units of insulin     insulin glargine 100 UNIT/ML injection  Commonly known as:  LANTUS  Inject 0.2 mLs (20 Units total) into the skin 2 (two) times daily.     metoprolol tartrate 12.5 mg Tabs tablet  Commonly known as:  LOPRESSOR  Take 0.5 tablets (12.5 mg total) by mouth 2 (two) times daily.     nystatin cream  Commonly known as:  MYCOSTATIN  Apply topically 3 (three) times daily.     pantoprazole 40 MG tablet  Commonly known as:  PROTONIX  Take 1 tablet (40 mg total) by mouth daily.     simvastatin 20 MG tablet  Commonly known as:  ZOCOR  Take 1 tablet (20 mg total) by mouth every evening.     warfarin 1 MG tablet  Commonly known as:  COUMADIN  Take 1 tablet (1 mg total) by mouth daily at 6 PM. Or as directed.       The patient has been discharged on:   1.Beta Blocker:  Yes [x   ]                              No   [   ]                              If No, reason:  2.Ace Inhibitor/ARB: Yes [   ]                                     No  [  x  ]  If No, reason: Elevated Creatinine  3.Statin:   Yes [ x  ]                  No  [   ]                  If No, reason:  4.Ecasa:  Yes  [   ]                  No   [ x  ]                  If No, reason: Allergy  Follow-up Information   Follow up with VAN Wilber Oliphant, MD On 01/08/2014. (Appointment is at 1:00)    Specialty:  Cardiothoracic Surgery   Contact information:   [place] [address] [phone]       Follow up with [facility] On 01/08/2014. (Please get CXR at 12:00, located on first floor of [facility])    Contact information:   [place]       Follow up with Laverda Page, MD On 12/23/2013. (Appointment is at 3:30)    Specialty:  Cardiology   Contact information:   [address] [place] [address] [phone]       Follow up with Sherril Croon, MD. (Call for a follow up appointment 1-2 weeks)    Specialty:  Nephrology   Contact information:   [place] [address] [phone]       Follow up with SNF. (Please draw a PT and INR on Monday 12/16/2013. Call or fax results to Dr. Irven Shelling office [phone]))       Follow-up Information   Follow up with VAN Wilber Oliphant, MD On 01/08/2014. (Appointment is at 1:00)    Specialty:  Cardiothoracic Surgery   Contact information:   [place] [address] [phone]       Follow up with [facility] On 01/08/2014. (Please get CXR at 12:00, located on first floor of [facility])    Contact information:   [place]       Follow up with Laverda Page, MD On 12/23/2013. (Appointment is at 3:30)    Specialty:  Cardiology   Contact information:   [place] [place] [address] [phone]       Follow up with Sherril Croon, MD. (Call for a follow up appointment 1-2 weeks)    Specialty:  Nephrology   Contact information:   [place]  [address] [phone]       Follow up with SNF. (Please draw a PT and INR on Monday 12/16/2013. Call or fax results to Dr. Irven Shelling office [phone]))       Signed: Nani Skillern 12/13/2013, 1:09 PM

## 2013-12-09 NOTE — Progress Notes (Signed)
Subjective: Continues to be improved neurologically, complaining of some nausea today.   Exam: Filed Vitals:   12/09/13 1000  BP: 149/66  Pulse: 94  Temp:   Resp: 12   Gen: In bed, NAD MS: Awake, alert, oriented to month, place, year OT:LXBWI, EOMI Motor: moves all extremities well, bilateral proximal weakness which I suspect is deconditioning Sensory:intact to LT    Impression: 80 yo F with episodes of confusion and aphasia with concerning EEG pattern. 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 Given en her renal failure and fairly large doses of cefipime, I suspect cefepime induced neurotoxicity as an etiology. Her improvement with treatment is reassuring. Amiodarone can casuse neurotoxicty, but usually this manifests as ataxia and tremor, and I am not familiar with it causing NCSE.   Recommendations: 1) will decrease vimpat to 100mg  BID and will put in stop date of 10/09(7 days from seizure) 2) Neurology will sign off at this time, please call with any further questions.    Roland Rack, MD [facility] [phone]  If 7pm- 7am, please page neurology on call as listed in [place].

## 2013-12-09 NOTE — Progress Notes (Signed)
Inpatient Diabetes Program Recommendations  AACE/ADA: New Consensus Statement on Inpatient Glycemic Control (2013)  Target Ranges:  Prepandial:   less than 140 mg/dL      Peak postprandial:   less than 180 mg/dL (1-2 hours)      Critically ill patients:  140 - 180 mg/dL   Reason for Assessment:  Results for MALLERIE, BLOK (MRN [id number]) as of 12/09/2013 10:10  Ref. Range 12/08/2013 11:21 12/08/2013 15:16 12/08/2013 20:30 12/08/2013 23:23 12/09/2013 03:55  Glucose-Capillary Latest Range: 70-99 mg/dL 255 (H) 250 (H) 115 (H) 142 (H) 161 (H)   Diabetes history: Type 2 diabetes Outpatient Diabetes medications: Lantus 20 units daily,  Novolog 5-10 units tid with meals Current orders for Inpatient glycemic control: Novolog TCTS q 4 hours  Please consider restarting Lantus 10 units daily.  Also consider reducing Novolog correction to sensitive and add Novolog meal coverage 3 units tid with meals (to be held if patient eats less than 50%).    Thanks, Adah Perl, RN, BC-ADM Inpatient Diabetes Coordinator Pager [phone]

## 2013-12-09 NOTE — Progress Notes (Addendum)
      [address]       [place], [address]             [phone]      31 Days Post-Op Procedure(s) (LRB): CORONARY ARTERY BYPASS GRAFTING (CABG), on pump, times two, using left internal mammary artery, cryo saphenous vein. (N/A) INTRAOPERATIVE TRANSESOPHAGEAL ECHOCARDIOGRAM (N/A) MITRAL VALVE (MV) REPLACEMENT (N/A)  Subjective:  Ms. Mattera states she is nauseated this morning.  Otherwise she states she feels better.    Objective: Vital signs in last 24 hours: Temp:  [97.5 F (36.4 C)-98.3 F (36.8 C)] 97.8 F (36.6 C) (10/05 0721) Pulse Rate:  [87-96] 94 (10/05 0800) Cardiac Rhythm:  [-] Normal sinus rhythm (10/05 0800) Resp:  [9-20] 9 (10/05 0800) BP: (117-156)/(52-80) 156/78 mmHg (10/05 0800) SpO2:  [94 %-100 %] 96 % (10/05 0800) Weight:  [181 lb 3.5 oz (82.2 kg)] 181 lb 3.5 oz (82.2 kg) (10/05 0500)  Intake/Output from previous day: 10/04 0701 - 10/05 0700 In: 1994.3 [P.O.:510; I.V.:1254.3; IV Piggyback:230] Out: 3375 [Urine:3375] Intake/Output this shift: Total I/O In: 50 [I.V.:50] Out: 175 [Urine:175]  General appearance: alert, cooperative and no distress Heart: regular rate and rhythm Lungs: clear to auscultation bilaterally Abdomen: soft, non-tender; bowel sounds normal; no masses,  no organomegaly Extremities: edema trace to minimal Wound: clean and dry  Lab Results:  Recent Labs  12/08/13 0333 12/09/13 0350  WBC 5.8 5.3  HGB 10.9* 11.0*  HCT 33.6* 33.9*  PLT 207 209   BMET:  Recent Labs  12/07/13 0337 12/08/13 0333  NA 135* 132*  K 3.8 3.9  CL 94* 93*  CO2 25 24  GLUCOSE 104* 132*  BUN 48* 47*  CREATININE 3.35* 3.31*  CALCIUM 8.7 8.7    PT/INR:  Recent Labs  12/08/13 0333  LABPROT 23.2*  INR 2.06*   ABG    Component Value Date/Time   PHART 7.459* 12/06/2013 1000   HCO3 25.3* 12/06/2013 1000   TCO2 26 12/06/2013 1000   ACIDBASEDEF 7.0* 11/11/2013 1642   O2SAT 74.5 12/06/2013 1455   CBG (last 3)   Recent  Labs  12/08/13 2030 12/08/13 2323 12/09/13 0355  GLUCAP 115* 142* 161*    Assessment/Plan: S/P Procedure(s) (LRB): CORONARY ARTERY BYPASS GRAFTING (CABG), on pump, times two, using left internal mammary artery, cryo saphenous vein. (N/A) INTRAOPERATIVE TRANSESOPHAGEAL ECHOCARDIOGRAM (N/A) MITRAL VALVE (MV) REPLACEMENT (N/A)  1. CV- hemodynamically stable, maintaining NSR- on Dopamine, continue Amiodarone, will start Low Dose Beta blocker HR in the 90s, pressure elevated in 150's 2. Pulm- off oxygen, continue IS 3. Renal- CKD Stg IV per Nephrology, creatinine at 3.5, weight is significantly improved on IV Lasix, Nephrology as signed off 4. Neurology- Metabolic Encephalopathy, improving, on Vimpat will continue per Neurology recs 5. ID- Serratia Bacteremia- on IV Zosyn today will be day 15/15 of treatment, will discontinue after this dose 6. DM-sugars mostly controlled, continue current regimen 7. Dispo- patient looks good today, Dopamine wean per Dr. Prescott Gum Neprhology has signed off, will complete IV ABX today, will start low dose Beta Blocker   LOS: 34 days    Claudia [name], Claudia [name] 12/09/2013  Wean dopamine off then start Beta blocker if BP tolerates- 2 D echo shows improved LV--. 50 EF Stop amiodarone- maintaining NSR Continue to attempt to try ambulatuion- so far she has only gotten to the chair Waiting for eval by LTAC patient examined and medical record reviewed,agree with above note. VAN TRIGT III,Donnielle Addison 12/09/2013

## 2013-12-10 ENCOUNTER — Inpatient Hospital Stay (HOSPITAL_COMMUNITY): Payer: Medicare HMO

## 2013-12-10 LAB — PROTIME-INR
INR: 2.88 — ABNORMAL HIGH (ref 0.00–1.49)
Prothrombin Time: 30.2 seconds — ABNORMAL HIGH (ref 11.6–15.2)

## 2013-12-10 LAB — BASIC METABOLIC PANEL
ANION GAP: 13 (ref 5–15)
BUN: 42 mg/dL — ABNORMAL HIGH (ref 6–23)
CALCIUM: 8.6 mg/dL (ref 8.4–10.5)
CO2: 29 mEq/L (ref 19–32)
Chloride: 94 mEq/L — ABNORMAL LOW (ref 96–112)
Creatinine, Ser: 3.2 mg/dL — ABNORMAL HIGH (ref 0.50–1.10)
GFR calc Af Amer: 15 mL/min — ABNORMAL LOW (ref >90)
GFR, EST NON AFRICAN AMERICAN: 13 mL/min — AB (ref >90)
Glucose, Bld: 88 mg/dL (ref 70–99)
Potassium: 3.3 mEq/L — ABNORMAL LOW (ref 3.7–5.3)
SODIUM: 136 meq/L — AB (ref 137–147)

## 2013-12-10 LAB — CBC
HCT: 30.7 % — ABNORMAL LOW (ref 36.0–46.0)
Hemoglobin: 10.1 g/dL — ABNORMAL LOW (ref 12.0–15.0)
MCH: 28.2 pg (ref 26.0–34.0)
MCHC: 32.9 g/dL (ref 30.0–36.0)
MCV: 85.8 fL (ref 78.0–100.0)
Platelets: 212 10*3/uL (ref 150–400)
RBC: 3.58 MIL/uL — ABNORMAL LOW (ref 3.87–5.11)
RDW: 15.7 % — ABNORMAL HIGH (ref 11.5–15.5)
WBC: 4.8 10*3/uL (ref 4.0–10.5)

## 2013-12-10 LAB — GLUCOSE, CAPILLARY
GLUCOSE-CAPILLARY: 120 mg/dL — AB (ref 70–99)
GLUCOSE-CAPILLARY: 265 mg/dL — AB (ref 70–99)
GLUCOSE-CAPILLARY: 246 mg/dL — AB (ref 70–99)
Glucose-Capillary: 170 mg/dL — ABNORMAL HIGH (ref 70–99)
Glucose-Capillary: 199 mg/dL — ABNORMAL HIGH (ref 70–99)
Glucose-Capillary: 214 mg/dL — ABNORMAL HIGH (ref 70–99)
Glucose-Capillary: 270 mg/dL — ABNORMAL HIGH (ref 70–99)

## 2013-12-10 MED ORDER — INSULIN GLARGINE 100 UNIT/ML ~~LOC~~ SOLN
12.0000 [IU] | Freq: Two times a day (BID) | SUBCUTANEOUS | Status: DC
  Filled 2013-12-10 (×2): qty 0.12

## 2013-12-10 MED ORDER — INSULIN ASPART 100 UNIT/ML ~~LOC~~ SOLN
3.0000 [IU] | Freq: Three times a day (TID) | SUBCUTANEOUS | Status: DC
  Administered 2013-12-10 – 2013-12-13 (×10): 3 [IU] via SUBCUTANEOUS

## 2013-12-10 MED ORDER — INSULIN GLARGINE 100 UNIT/ML ~~LOC~~ SOLN
10.0000 [IU] | Freq: Every day | SUBCUTANEOUS | Status: DC
  Administered 2013-12-10: 10 [IU] via SUBCUTANEOUS
  Filled 2013-12-10 (×2): qty 0.1

## 2013-12-10 MED ORDER — INSULIN ASPART 100 UNIT/ML ~~LOC~~ SOLN
0.0000 [IU] | Freq: Three times a day (TID) | SUBCUTANEOUS | Status: DC
  Administered 2013-12-10: 3 [IU] via SUBCUTANEOUS
  Administered 2013-12-10: 5 [IU] via SUBCUTANEOUS
  Administered 2013-12-11: 2 [IU] via SUBCUTANEOUS
  Administered 2013-12-11 (×2): 3 [IU] via SUBCUTANEOUS
  Administered 2013-12-12 – 2013-12-13 (×4): 2 [IU] via SUBCUTANEOUS

## 2013-12-10 MED ORDER — POTASSIUM CHLORIDE 10 MEQ/50ML IV SOLN
10.0000 meq | INTRAVENOUS | Status: AC
  Administered 2013-12-10: 10 meq via INTRAVENOUS
  Filled 2013-12-10: qty 50

## 2013-12-10 MED ORDER — DOCUSATE SODIUM 100 MG PO CAPS
100.0000 mg | ORAL_CAPSULE | Freq: Every day | ORAL | Status: DC
  Administered 2013-12-10 – 2013-12-11 (×2): 100 mg via ORAL
  Filled 2013-12-10 (×4): qty 1

## 2013-12-10 NOTE — Clinical Social Work Note (Signed)
Patient discussed in [place] today, patient remains on dopamine drip.  CSW will continue to follow for support and discharge planning.  Jones Broom. Duncan, MSW, [place] 12/10/2013 3:27 PM

## 2013-12-10 NOTE — Progress Notes (Addendum)
      [address]       [place] [address]             [phone]      32 Days Post-Op Procedure(s) (LRB): CORONARY ARTERY BYPASS GRAFTING (CABG), on pump, times two, using left internal mammary artery, cryo saphenous vein. (N/A) INTRAOPERATIVE TRANSESOPHAGEAL ECHOCARDIOGRAM (N/A) MITRAL VALVE (MV) REPLACEMENT (N/A)  Subjective:  Ms. Adee has no complaints this morning.  She is not ambulating much.  The patient was instructed on the importance of ambulating at least 3 times per day to get her strength and endurance up. + BM  Objective: Vital signs in last 24 hours: Temp:  [97.4 F (36.3 C)-98.6 F (37 C)] 98.6 F (37 C) (10/06 0700) Pulse Rate:  [78-96] 88 (10/06 0700) Cardiac Rhythm:  [-] Normal sinus rhythm (10/06 0800) Resp:  [10-28] 13 (10/06 0700) BP: (108-159)/(39-91) 121/53 mmHg (10/06 0600) SpO2:  [95 %-100 %] 99 % (10/06 0700) Weight:  [181 lb (82.1 kg)] 181 lb (82.1 kg) (10/06 0407)  Intake/Output from previous day: 10/05 0701 - 10/06 0700 In: 1958.2 [P.O.:660; I.V.:1263.2; IV Piggyback:35] Out: 1400 [Urine:1400] Intake/Output this shift: Total I/O In: 94.3 [I.V.:44.3; IV Piggyback:50] Out: 20 [Urine:20]  General appearance: alert, cooperative and no distress Heart: regular rate and rhythm Lungs: clear to auscultation bilaterally Abdomen: soft, non-tender; bowel sounds normal; no masses,  no organomegaly Extremities: edema trace Wound: clean andd ry  Lab Results:  Recent Labs  12/09/13 0350 12/10/13 0352  WBC 5.3 4.8  HGB 11.0* 10.1*  HCT 33.9* 30.7*  PLT 209 212   BMET:  Recent Labs  12/08/13 0333 12/10/13 0352  NA 132* 136*  K 3.9 3.3*  CL 93* 94*  CO2 24 29  GLUCOSE 132* 88  BUN 47* 42*  CREATININE 3.31* 3.20*  CALCIUM 8.7 8.6    PT/INR:  Recent Labs  12/10/13 0352  LABPROT 30.2*  INR 2.88*   ABG    Component Value Date/Time   PHART 7.459* 12/06/2013 1000   HCO3 25.3* 12/06/2013 1000   TCO2 26 12/06/2013  1000   ACIDBASEDEF 7.0* 11/11/2013 1642   O2SAT 74.5 12/06/2013 1455   CBG (last 3)   Recent Labs  12/09/13 1949 12/09/13 2355 12/10/13 0305  GLUCAP 250* 199* 120*    Assessment/Plan: S/P Procedure(s) (LRB): CORONARY ARTERY BYPASS GRAFTING (CABG), on pump, times two, using left internal mammary artery, cryo saphenous vein. (N/A) INTRAOPERATIVE TRANSESOPHAGEAL ECHOCARDIOGRAM (N/A) MITRAL VALVE (MV) REPLACEMENT (N/A)  1. CV- Hemodynamically stable, off Dopamine, Amiodarone- continue Lopressor, INR therapeutic at 2.88 2. Pulm- off oxygen, encouraged continued IS 3. Renal- creatinine down to 3.20, weight is below baseline, will continue Lasix, Nephrology has signed off 4. Neurology- mental status has improved, on VIMAT ( stop date 10/9) for suspected seizures which have been ruled out, Metabolic encephalopathy improved 5. DM-sugars have been inconsistent, adjusted insulin regimen per Diabetes Coordinator recommendations 6. Deconditioning- patient severely deconditioned, however insurance will not approve LTAC or CIR, will needs SNF at discharge.  Patient needs to get up and ambulate 7. Dispo- patient stable, making progress, will transfer to 2W circle bed today   LOS: 35 days    Lea Walbert 12/10/2013

## 2013-12-10 NOTE — Progress Notes (Signed)
32 Days Post-Op Procedure(s) (LRB): CORONARY ARTERY BYPASS GRAFTING (CABG), on pump, times two, using left internal mammary artery, cryo saphenous vein. (N/A) INTRAOPERATIVE TRANSESOPHAGEAL ECHOCARDIOGRAM (N/A) MITRAL VALVE (MV) REPLACEMENT (N/A) Subjective: OOB to chair Nausea better  NSR, BP stable off dopamine INR 2.9 for tissue MVR Walked 5 ft with PT finished 14 days of antibiotics for serratia in sputum, blood Creat stable 3.3 Not candidate for LTAC, CIR per case manager assessment Objective: Vital signs in last 24 hours: Temp:  [97.4 F (36.3 C)-98.6 F (37 C)] 98.6 F (37 C) (10/06 0700) Pulse Rate:  [78-96] 88 (10/06 0700) Cardiac Rhythm:  [-] Normal sinus rhythm (10/06 0800) Resp:  [10-28] 13 (10/06 0700) BP: (108-159)/(39-91) 121/53 mmHg (10/06 0600) SpO2:  [95 %-100 %] 99 % (10/06 0700) Weight:  [181 lb (82.1 kg)] 181 lb (82.1 kg) (10/06 0407)  Hemodynamic parameters for last 24 hours:  afebrile  Intake/Output from previous day: 10/05 0701 - 10/06 0700 In: 1958.2 [P.O.:660; I.V.:1263.2; IV Piggyback:35] Out: 1400 [Urine:1400] Intake/Output this shift: Total I/O In: 94.3 [I.V.:44.3; IV Piggyback:50] Out: 20 [Urine:20]  Exam Neuro intact No edema Incisions clean,dry Neck wound with neg cultures  Lab Results:  Recent Labs  12/09/13 0350 12/10/13 0352  WBC 5.3 4.8  HGB 11.0* 10.1*  HCT 33.9* 30.7*  PLT 209 212   BMET:  Recent Labs  12/08/13 0333 12/10/13 0352  NA 132* 136*  K 3.9 3.3*  CL 93* 94*  CO2 24 29  GLUCOSE 132* 88  BUN 47* 42*  CREATININE 3.31* 3.20*  CALCIUM 8.7 8.6    PT/INR:  Recent Labs  12/10/13 0352  LABPROT 30.2*  INR 2.88*   ABG    Component Value Date/Time   PHART 7.459* 12/06/2013 1000   HCO3 25.3* 12/06/2013 1000   TCO2 26 12/06/2013 1000   ACIDBASEDEF 7.0* 11/11/2013 1642   O2SAT 74.5 12/06/2013 1455   CBG (last 3)   Recent Labs  12/09/13 1949 12/09/13 2355 12/10/13 0305  GLUCAP 250* 199* 120*     Assessment/Plan: S/P Procedure(s) (LRB): CORONARY ARTERY BYPASS GRAFTING (CABG), on pump, times two, using left internal mammary artery, cryo saphenous vein. (N/A) INTRAOPERATIVE TRANSESOPHAGEAL ECHOCARDIOGRAM (N/A) MITRAL VALVE (MV) REPLACEMENT (N/A) Plan for transfer to step-down: see transfer orders Plan SNF later in week  LOS: 35 days    VAN TRIGT III,PETER 12/10/2013

## 2013-12-10 NOTE — Progress Notes (Signed)
Pt received into room 2w25, pt placed on tele, pt oriented to room, pt stated no pain at this time Rickard Rhymes, RN

## 2013-12-10 NOTE — Progress Notes (Signed)
1615 transferred to 2W25 via wheelchair and monitor, placed in bed, RN to receive in room, SCD's placed on pt.

## 2013-12-11 LAB — BASIC METABOLIC PANEL
Anion gap: 11 (ref 5–15)
BUN: 40 mg/dL — ABNORMAL HIGH (ref 6–23)
CO2: 28 mEq/L (ref 19–32)
Calcium: 8.3 mg/dL — ABNORMAL LOW (ref 8.4–10.5)
Chloride: 89 mEq/L — ABNORMAL LOW (ref 96–112)
Creatinine, Ser: 3.05 mg/dL — ABNORMAL HIGH (ref 0.50–1.10)
GFR calc Af Amer: 16 mL/min — ABNORMAL LOW (ref >90)
GFR calc non Af Amer: 14 mL/min — ABNORMAL LOW (ref >90)
Glucose, Bld: 198 mg/dL — ABNORMAL HIGH (ref 70–99)
Potassium: 3.6 mEq/L — ABNORMAL LOW (ref 3.7–5.3)
Sodium: 128 mEq/L — ABNORMAL LOW (ref 137–147)

## 2013-12-11 LAB — PROTIME-INR
INR: 1.63 — ABNORMAL HIGH (ref 0.00–1.49)
PROTHROMBIN TIME: 19.3 s — AB (ref 11.6–15.2)

## 2013-12-11 LAB — CBC
HCT: 30.1 % — ABNORMAL LOW (ref 36.0–46.0)
Hemoglobin: 9.9 g/dL — ABNORMAL LOW (ref 12.0–15.0)
MCH: 28.4 pg (ref 26.0–34.0)
MCHC: 32.9 g/dL (ref 30.0–36.0)
MCV: 86.2 fL (ref 78.0–100.0)
Platelets: 225 10*3/uL (ref 150–400)
RBC: 3.49 MIL/uL — ABNORMAL LOW (ref 3.87–5.11)
RDW: 15.9 % — ABNORMAL HIGH (ref 11.5–15.5)
WBC: 4.6 10*3/uL (ref 4.0–10.5)

## 2013-12-11 LAB — GLUCOSE, CAPILLARY
GLUCOSE-CAPILLARY: 247 mg/dL — AB (ref 70–99)
GLUCOSE-CAPILLARY: 251 mg/dL — AB (ref 70–99)
Glucose-Capillary: 185 mg/dL — ABNORMAL HIGH (ref 70–99)
Glucose-Capillary: 149 mg/dL — ABNORMAL HIGH (ref 70–99)

## 2013-12-11 MED ORDER — WARFARIN SODIUM 2 MG PO TABS
2.0000 mg | ORAL_TABLET | Freq: Every day | ORAL | Status: DC
  Administered 2013-12-11: 2 mg via ORAL
  Filled 2013-12-11 (×2): qty 1

## 2013-12-11 MED ORDER — POTASSIUM CHLORIDE CRYS ER 20 MEQ PO TBCR: 20.0000 meq | EXTENDED_RELEASE_TABLET | Freq: Once | ORAL | Status: DC

## 2013-12-11 MED ORDER — INSULIN GLARGINE 100 UNIT/ML ~~LOC~~ SOLN
18.0000 [IU] | Freq: Two times a day (BID) | SUBCUTANEOUS | Status: DC
  Administered 2013-12-11 (×2): 18 [IU] via SUBCUTANEOUS
  Filled 2013-12-11 (×3): qty 0.18

## 2013-12-11 MED ORDER — PATIENT'S GUIDE TO USING COUMADIN BOOK
Freq: Once | Status: AC
  Administered 2013-12-11: 15:00:00
  Filled 2013-12-11: qty 1

## 2013-12-11 MED ORDER — WARFARIN SODIUM 1 MG PO TABS
1.0000 mg | ORAL_TABLET | Freq: Every day | ORAL | Status: DC
  Filled 2013-12-11: qty 1

## 2013-12-11 MED ORDER — POTASSIUM CHLORIDE CRYS ER 10 MEQ PO TBCR
10.0000 meq | EXTENDED_RELEASE_TABLET | Freq: Once | ORAL | Status: AC
  Administered 2013-12-11: 10 meq via ORAL
  Filled 2013-12-11: qty 1

## 2013-12-11 NOTE — Progress Notes (Signed)
Inpatient Diabetes Program Recommendations  AACE/ADA: New Consensus Statement on Inpatient Glycemic Control (2013)  Target Ranges:  Prepandial:   less than 140 mg/dL      Peak postprandial:   less than 180 mg/dL (1-2 hours)      Critically ill patients:  140 - 180 mg/dL   Reason for Assessment:  Results for BUFFIE, HERNE (MRN [id number]) as of 12/11/2013 12:05  Ref. Range 12/10/2013 11:23 12/10/2013 15:09 12/10/2013 16:36 12/10/2013 21:00 12/11/2013 06:27  Glucose-Capillary Latest Range: 70-99 mg/dL 270 (H) 265 (H) 246 (H) 170 (H) 185 (H)   Diabetes history: Diabetes Type 2 Outpatient Diabetes medications: Lantus 20 units daily, Novolog 5-10 units tid with meals Current orders for Inpatient glycemic control:  Lantus 18 units bid, Novolog sensitive tid with meals, Novolog 3 units tid with meals  Consider reducing Lantus back to 20 units daily (home dose).  Thanks, Adah Perl, RN, BC-ADM Inpatient Diabetes Coordinator Pager [phone]

## 2013-12-11 NOTE — Progress Notes (Signed)
      [address]       [place] [address]             [phone]        33 Days Post-Op Procedure(s) (LRB): CORONARY ARTERY BYPASS GRAFTING (CABG), on pump, times two, using left internal mammary artery, cryo saphenous vein. (N/A) INTRAOPERATIVE TRANSESOPHAGEAL ECHOCARDIOGRAM (N/A) MITRAL VALVE (MV) REPLACEMENT (N/A)  Subjective: Patient awake and alert  Objective: Vital signs in last 24 hours: Temp:  [97.5 F (36.4 C)-98.3 F (36.8 C)] 98.3 F (36.8 C) (10/07 0630) Pulse Rate:  [81-90] 82 (10/07 0713) Cardiac Rhythm:  [-] Normal sinus rhythm (10/07 0755) Resp:  [11-18] 18 (10/07 0630) BP: (118-160)/(50-66) 139/54 mmHg (10/07 0713) SpO2:  [94 %-100 %] 99 % (10/07 0630) Weight:  [176 lb (79.833 kg)] 176 lb (79.833 kg) (10/07 0630)   Current Weight  12/11/13 176 lb (79.833 kg)      Intake/Output from previous day: 10/06 0701 - 10/07 0700 In: 1329.3 [P.O.:960; I.V.:284.3; IV Piggyback:85] Out: 990 [Urine:990]   Physical Exam:  Cardiovascular: RRR Pulmonary: Clear to auscultation bilaterally; no rales, wheezes, or rhonchi. Abdomen: Soft, non tender, bowel sounds present. Extremities: SCDs in place Wounds: Clean and dry.  No erythema or signs of infection.  Lab Results: CBC: Recent Labs  12/10/13 0352 12/11/13 0500  WBC 4.8 4.6  HGB 10.1* 9.9*  HCT 30.7* 30.1*  PLT 212 225   BMET:  Recent Labs  12/10/13 0352 12/11/13 0500  NA 136* 128*  K 3.3* 3.6*  CL 94* 89*  CO2 29 28  GLUCOSE 88 198*  BUN 42* 40*  CREATININE 3.20* 3.05*  CALCIUM 8.6 8.3*    PT/INR:  Lab Results  Component Value Date   INR 2.88* 12/10/2013   INR 2.06* 12/08/2013   INR 1.91* 12/06/2013   ABG:  INR: Will add last result for INR, ABG once components are confirmed Will add last 4 CBG results once components are confirmed  Assessment/Plan:  1. CV - SR in the 80's. On Dopamine drip, Lopressor 12.5 bid, and Coumadin. INR not drawn this am. Will order.  Monitor BP as may need to increase BB. 2.  Pulmonary - Encourage incentive spirometer 3. Volume Overload - On Lasix 80 daily. 4.  Acute blood loss anemia - H and H 9.69 and 30.1 this am. On Aranesp. 5. Renal insufficiency-creatinine down to 3.05 6. DM-CBGs 246/170/185. On Insulin. Dr. Prescott Gum has made adjustments this am. Pre op HGA1C 7.2. Monitor. 7. Neurology-mental status stable. On VIMAT (stop date is 10/09)  8. Hyponatremia-sodium down to 128. 9. Hypokalemia-gently supplement as creatinine 3.05 9. Patient is NOT a candidate for LTAC. Will need SNF at end of week.  Abdulhadi Stopa MPA-C 12/11/2013,8:28 AM

## 2013-12-11 NOTE — Progress Notes (Signed)
Physical Therapy Treatment Patient Details Name: Claudia [name] MRN: [id number] DOB: May 31, 1931 Today's Date: 12/11/2013    History of Present Illness 80 y.o. female admitted [facility] on 11/05/13 with chest pain.  She is now s/p emergency CABG x 2 and MVR on 11/09/13.  PMHx includes HTN, MI, DM.  Post-op course complicated by possible seizure activity.  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  Follow Up Recommendations  SNF     Equipment Recommendations  None recommended by PT    Recommendations for Other Services       Precautions / Restrictions Precautions Precautions: Fall;Sternal Precaution Comments: orthostatic Restrictions Weight Bearing Restrictions: Yes    Mobility  Bed Mobility Overal bed mobility: Needs Assistance Bed Mobility: Supine to Sit     Supine to sit: Mod assist;Min assist     General bed mobility comments: Min A to bring trunk up into sitting and mod A to bring hips to EOB.  Transfers Overall transfer level: Needs assistance Equipment used: Rolling walker (2 wheeled) Transfers: Sit to/from [facility] Sit to Stand: +2 physical assistance;Mod assist Stand pivot transfers: +2 physical assistance;Mod assist       General transfer comment: Assist to bring hips up and for posterior lean. Verbal cues for pt to place hands on knees.  Ambulation/Gait Ambulation/Gait assistance: Mod assist Ambulation Distance (Feet): 4 Feet Assistive device: Rolling walker (2 wheeled) Gait Pattern/deviations: Step-through pattern;Decreased step length - right;Decreased step length - left;Shuffle;Trunk flexed Gait velocity: slow Gait velocity interpretation: Below normal speed for age/gender General Gait Details: Assist for balance due to posterior lean. Pt unable to correct lean with  verbal/tactile cues.   Stairs            Wheelchair Mobility    Modified Rankin (Stroke Patients Only)       Balance   Sitting-balance support: No upper extremity supported;Feet supported Sitting balance-Leahy Scale: Fair     Standing balance support: Bilateral upper extremity supported Standing balance-Leahy Scale: Poor Standing balance comment: Requires walker and min to mod A to maintain static standing due to posterior lean.                    Cognition Arousal/Alertness: Awake/alert Behavior During Therapy: WFL for tasks assessed/performed Overall Cognitive Status: Within Functional Limits for tasks assessed                      Exercises General Exercises - Lower Extremity Long Arc Quad: AROM;Both;10 reps;Seated    General Comments        Pertinent Vitals/Pain Pain Assessment: No/denies pain    Home Living                      Prior Function            PT Goals (current goals can now be found in the care plan section) Progress towards PT goals: Not progressing toward goals - comment (fatigue)    Frequency  Min 2X/week    PT Plan Current plan remains appropriate    Co-evaluation             End of Session Equipment Utilized During Treatment: Gait belt Activity Tolerance: Patient limited by fatigue Patient left: in chair;with call bell/phone within reach     Time: 1032-1057 PT  Time Calculation (min): 25 min  Charges:  $Gait Training: 23-37 mins                    G Codes:      Claudia [name] 2013-12-12, 11:11 AM  [employer] PT [phone]

## 2013-12-11 NOTE — Discharge Instructions (Addendum)
1. Please obtain vital signs at least one time daily 2.Please weigh the patient daily. If he or she continues to gain weight or develops lower extremity edema, contact the office at [phone]. 3. Ambulate patient at least three times daily and please use sternal precautions.  Activity: 1.May walk up steps                2.No lifting more than ten pounds for four weeks.                 3.No driving for four weeks.                4.Stop any activity that causes chest pain, shortness of breath, dizziness, sweating or excessive weakness.                5.Avoid straining.                6.Continue with your breathing exercises daily.  Diet: Diabetic diet and Low fat, renal diet  Wound Care: May shower.  Clean wounds with mild soap and water daily. Contact the office at [phone] if any problems arise.     Mitral Valve Replacement, Care After Refer to this sheet in the next few weeks. These instructions provide you with information on caring for yourself after your procedure. Your health care provider may also give you specific instructions. Your treatment has been planned according to current medical practices, but problems sometimes occur. Call your health care provider if you have any problems or questions after your procedure.  HOME CARE INSTRUCTIONS   Take medicines only as directed by your health care provider.  Take your temperature every morning for the first 7 days after surgery. Write these down.  Weigh yourself every morning for at least 7 days after surgery. Write your weight down.  Wear elastic stockings during the day for at least 2 weeks after surgery or as directed by your health care provider. Use them longer if your ankles are swollen. The stockings help blood flow and help reduce swelling in the legs.  Take frequent naps or rest often throughout the day.  Avoid lifting more than 10 lb (4.5 kg) or pushing or pulling things with your arms for 6-8 weeks or as  directed by your health care provider.  Avoid driving or airplane travel for 4-6 weeks after surgery or as directed. If you are riding in a car for an extended period, stop every 1-2 hours to stretch your legs.  Avoid crossing your legs.  Avoid climbing stairs and using the handrail to pull yourself up for the first 2-3 weeks after surgery.  Do not take baths for 2-4 weeks after surgery. Take showers once your health care provider approves. Pat incisions dry. Do not rub incisions with a washcloth or towel.  Return to work as directed by your health care provider.  Drink enough fluids to keep your urine clear or pale yellow.  Do not strain to have a bowel movement. Eat high-fiber foods if you become constipated. You may also take a medicine to help you have a bowel movement (laxative) as directed by your health care provider.  Resume sexual activity as directed by your health care provider. SEEK MEDICAL CARE IF:   You develop a skin rash.   Your weight is increasing each day over 2-3 days.  Your weight increases by 2 or more pounds (1 kg) in a single day.  You have a fever. [place]  CARE IF:   You develop chest pain that is not coming from your incision.  You develop shortness of breath or difficulty breathing.  You have drainage, redness, swelling, or pain at your incision site.  You have pus coming from your incision.  You develop light-headedness. MAKE SURE YOU:  Understand these directions.  Will watch your condition.  Will get help right away if you are not doing well or get worse. Document Released: 09/10/2004 Document Revised: 07/08/2013 Document Reviewed: 07/24/2012 [facility] Patient Information 2015 [place], [place]. This information is not intended to replace advice given to you by your health care provider. Make sure you discuss any questions you have with your health care provider.   Information on my medicine - Coumadin   (Warfarin)  This  medication education was reviewed with me or my healthcare representative as part of my discharge preparation.   Why was Coumadin prescribed for you? Coumadin was prescribed for you because you have a blood clot or a medical condition that can cause an increased risk of forming blood clots. Blood clots can cause serious health problems by blocking the flow of blood to the heart, lung, or brain. Coumadin can prevent harmful blood clots from forming. As a reminder your indication for Coumadin is:   Stroke Prevention Because Of Atrial Fibrillation  What test will check on my response to Coumadin? While on Coumadin (warfarin) you will need to have an INR test regularly to ensure that your dose is keeping you in the desired range. The INR (international normalized ratio) number is calculated from the result of the laboratory test called prothrombin time (PT).  If an INR APPOINTMENT HAS NOT ALREADY BEEN MADE FOR YOU please schedule an appointment to have this lab work done by your health care provider within 7 days. Your INR goal is usually a number between:  2 to 3 or your provider may give you a more narrow range like 2-2.5.  Ask your health care provider during an office visit what your goal INR is.  What  do you need to  know  About  COUMADIN? Take Coumadin (warfarin) exactly as prescribed by your healthcare provider about the same time each day.  DO NOT stop taking without talking to the doctor who prescribed the medication.  Stopping without other blood clot prevention medication to take the place of Coumadin may increase your risk of developing a new clot or stroke.  Get refills before you run out.  What do you do if you miss a dose? If you miss a dose, take it as soon as you remember on the same day then continue your regularly scheduled regimen the next day.  Do not take two doses of Coumadin at the same time.  Important Safety Information A possible side effect of Coumadin (Warfarin) is an  increased risk of bleeding. You should call your healthcare provider right away if you experience any of the following:   Bleeding from an injury or your nose that does not stop.   Unusual colored urine (red or dark brown) or unusual colored stools (red or black).   Unusual bruising for unknown reasons.   A serious fall or if you hit your head (even if there is no bleeding).  Some foods or medicines interact with Coumadin (warfarin) and might alter your response to warfarin. To help avoid this:   Eat a balanced diet, maintaining a consistent amount of Vitamin K.   Notify your provider about major diet changes you plan  to make.   Avoid alcohol or limit your intake to 1 drink for women and 2 drinks for men per day. (1 drink is 5 oz. wine, 12 oz. beer, or 1.5 oz. liquor.)  Make sure that ANY health care provider who prescribes medication for you knows that you are taking Coumadin (warfarin).  Also make sure the healthcare provider who is monitoring your Coumadin knows when you have started a new medication including herbals and non-prescription products.  Coumadin (Warfarin)  Major Drug Interactions  Increased Warfarin Effect Decreased Warfarin Effect  Alcohol (large quantities) Antibiotics (esp. Septra/Bactrim, Flagyl, Cipro) Amiodarone (Cordarone) Aspirin (ASA) Cimetidine (Tagamet) Megestrol (Megace) NSAIDs (ibuprofen, naproxen, etc.) Piroxicam (Feldene) Propafenone (Rythmol SR) Propranolol (Inderal) Isoniazid (INH) Posaconazole (Noxafil) Barbiturates (Phenobarbital) Carbamazepine (Tegretol) Chlordiazepoxide (Librium) Cholestyramine (Questran) Griseofulvin Oral Contraceptives Rifampin Sucralfate (Carafate) Vitamin K   Coumadin (Warfarin) Major Herbal Interactions  Increased Warfarin Effect Decreased Warfarin Effect  Garlic Ginseng Ginkgo biloba Coenzyme Q10 Green tea St. Johns wort    Coumadin (Warfarin) FOOD Interactions  Eat a consistent number of servings per week  of foods HIGH in Vitamin K (1 serving =  cup)  Collards (cooked, or boiled & drained) Kale (cooked, or boiled & drained) Mustard greens (cooked, or boiled & drained) Parsley *serving size only =  cup Spinach (cooked, or boiled & drained) Swiss chard (cooked, or boiled & drained) Turnip greens (cooked, or boiled & drained)  Eat a consistent number of servings per week of foods MEDIUM-HIGH in Vitamin K (1 serving = 1 cup)  Asparagus (cooked, or boiled & drained) Broccoli (cooked, boiled & drained, or raw & chopped) Brussel sprouts (cooked, or boiled & drained) *serving size only =  cup Lettuce, raw (green leaf, endive, romaine) Spinach, raw Turnip greens, raw & chopped   These websites have more information on Coumadin (warfarin):  FailFactory.se; VeganReport.com.au;

## 2013-12-12 LAB — PROTIME-INR
INR: 1.54 — ABNORMAL HIGH (ref 0.00–1.49)
Prothrombin Time: 18.5 seconds — ABNORMAL HIGH (ref 11.6–15.2)

## 2013-12-12 LAB — CULTURE, BLOOD (ROUTINE X 2)
CULTURE: NO GROWTH
CULTURE: NO GROWTH

## 2013-12-12 LAB — BASIC METABOLIC PANEL
Anion gap: 13 (ref 5–15)
BUN: 42 mg/dL — AB (ref 6–23)
CALCIUM: 8.2 mg/dL — AB (ref 8.4–10.5)
CHLORIDE: 91 meq/L — AB (ref 96–112)
CO2: 27 mEq/L (ref 19–32)
Creatinine, Ser: 3.11 mg/dL — ABNORMAL HIGH (ref 0.50–1.10)
GFR calc Af Amer: 15 mL/min — ABNORMAL LOW (ref >90)
GFR calc non Af Amer: 13 mL/min — ABNORMAL LOW (ref >90)
Glucose, Bld: 135 mg/dL — ABNORMAL HIGH (ref 70–99)
Potassium: 3.8 mEq/L (ref 3.7–5.3)
Sodium: 131 mEq/L — ABNORMAL LOW (ref 137–147)

## 2013-12-12 LAB — GLUCOSE, CAPILLARY
GLUCOSE-CAPILLARY: 183 mg/dL — AB (ref 70–99)
GLUCOSE-CAPILLARY: 190 mg/dL — AB (ref 70–99)
GLUCOSE-CAPILLARY: 127 mg/dL — AB (ref 70–99)
Glucose-Capillary: 152 mg/dL — ABNORMAL HIGH (ref 70–99)

## 2013-12-12 MED ORDER — INSULIN GLARGINE 100 UNIT/ML ~~LOC~~ SOLN
20.0000 [IU] | Freq: Two times a day (BID) | SUBCUTANEOUS | Status: DC
  Administered 2013-12-12 – 2013-12-13 (×3): 20 [IU] via SUBCUTANEOUS
  Filled 2013-12-12 (×4): qty 0.2

## 2013-12-12 MED ORDER — WARFARIN SODIUM 1 MG PO TABS
1.0000 mg | ORAL_TABLET | Freq: Every day | ORAL | Status: DC
  Administered 2013-12-12: 1 mg via ORAL
  Filled 2013-12-12 (×2): qty 1

## 2013-12-12 MED ORDER — WARFARIN SODIUM 4 MG PO TABS
4.0000 mg | ORAL_TABLET | Freq: Every day | ORAL | Status: DC
  Filled 2013-12-12: qty 1

## 2013-12-12 MED ORDER — POTASSIUM CHLORIDE CRYS ER 20 MEQ PO TBCR
20.0000 meq | EXTENDED_RELEASE_TABLET | Freq: Once | ORAL | Status: AC
  Administered 2013-12-12: 20 meq via ORAL
  Filled 2013-12-12: qty 1

## 2013-12-12 MED ORDER — WARFARIN SODIUM 5 MG PO TABS: 5.0000 mg | ORAL_TABLET | Freq: Every day | ORAL | Status: DC

## 2013-12-12 NOTE — Clinical Social Work Note (Signed)
CSW provided updated SNF offers to patient and her family at bedside- patient resides in [place] with her son, Dionne Bucy- we have left message for son to help her in picking preferred SNF bed for selection and insurance auth. Await callback from son regarding plans- Patient sitting up in chair- in good spirits- she is optimistic and ready to continue her rehab.    Eduard Clos, MSW, [place]

## 2013-12-12 NOTE — Progress Notes (Addendum)
      [address]       [place] [address]             [phone]        34 Days Post-Op Procedure(s) (LRB): CORONARY ARTERY BYPASS GRAFTING (CABG), on pump, times two, using left internal mammary artery, cryo saphenous vein. (N/A) INTRAOPERATIVE TRANSESOPHAGEAL ECHOCARDIOGRAM (N/A) MITRAL VALVE (MV) REPLACEMENT (N/A)  Subjective: Patient awake and alert. No specific complaints this am.  Objective: Vital signs in last 24 hours: Temp:  [97.5 F (36.4 C)-97.6 F (36.4 C)] 97.6 F (36.4 C) (10/08 0452) Pulse Rate:  [74-79] 74 (10/08 0719) Cardiac Rhythm:  [-] Normal sinus rhythm (10/07 1920) Resp:  [17-18] 18 (10/08 0452) BP: (128-146)/(53-62) 138/53 mmHg (10/08 0719) SpO2:  [100 %] 100 % (10/08 0452) Weight:  [187 lb 3.2 oz (84.913 kg)] 187 lb 3.2 oz (84.913 kg) (10/08 0452)   Current Weight  12/12/13 187 lb 3.2 oz (84.913 kg)      Intake/Output from previous day: 10/07 0701 - 10/08 0700 In: 840 [P.O.:840] Out: 1253 [Urine:1250; Stool:3]   Physical Exam:  Cardiovascular: RRR Pulmonary: Clear to auscultation bilaterally; no rales, wheezes, or rhonchi. Abdomen: Soft, non tender, bowel sounds present. Extremities: SCDs in place Wounds: Clean and dry.  No erythema or signs of infection.  Lab Results: CBC:  Recent Labs  12/10/13 0352 12/11/13 0500  WBC 4.8 4.6  HGB 10.1* 9.9*  HCT 30.7* 30.1*  PLT 212 225   BMET:   Recent Labs  12/11/13 0500 12/12/13 0540  NA 128* 131*  K 3.6* 3.8  CL 89* 91*  CO2 28 27  GLUCOSE 198* 135*  BUN 40* 42*  CREATININE 3.05* 3.11*  CALCIUM 8.3* 8.2*    PT/INR:  Lab Results  Component Value Date   INR 1.54* 12/12/2013   INR 1.63* 12/11/2013   INR 2.88* 12/10/2013   ABG:  INR: Will add last result for INR, ABG once components are confirmed Will add last 4 CBG results once components are confirmed  Assessment/Plan:  1. CV - SR in the 80's. On  Lopressor 12.5 bid, and Coumadin. INR decreased  from 1.63 to 1.54. Per Dr. Prescott Gum, INR to be between 1.5-2. Continue with 1 mg daily. 2.  Pulmonary - Encourage incentive spirometer 3. Volume Overload - On Lasix 80 daily. 4.  Acute blood loss anemia - Last H and H 9.9 and 30.1On Aranesp. 5. Renal insufficiency-creatinine down to 3.05 6. DM-CBGs 251/149/152. On Insulin.  Will adjust Insulin for better glucose control. Pre op HGA1C 7.2. Monitor. 7. Neurology-mental status stable. On VIMAT (stop date is 10/09)  8. Hyponatremia-sodium up to 131. 9. Hypokalemia-gently supplement as creatinine 3.11 9. Patient is NOT a candidate for LTAC. Will need SNF in am  ZIMMERMAN,DONIELLE MPA-C 12/12/2013,7:45 AM  Ready for SNF tomorrow- 10-9

## 2013-12-12 NOTE — Clinical Social Work Note (Signed)
Received phone call from son Roselie Awkward he was returning call from social work in regards to placement preference.  Patient's son said they would prefer to go to [facility] in [place].  Bed offer was received, informed patient's son that once doctor decides to discharge social work will contact him and let him know.  Patient's son said to call him on his cell phone at [phone] once discharge orders have been received.  Jones Broom. B[place], MSW, [place] 12/12/2013 2:27 PM

## 2013-12-13 LAB — BASIC METABOLIC PANEL
Anion gap: 14 (ref 5–15)
BUN: 39 mg/dL — ABNORMAL HIGH (ref 6–23)
CALCIUM: 8.4 mg/dL (ref 8.4–10.5)
CO2: 25 mEq/L (ref 19–32)
Chloride: 91 mEq/L — ABNORMAL LOW (ref 96–112)
Creatinine, Ser: 3.14 mg/dL — ABNORMAL HIGH (ref 0.50–1.10)
GFR calc Af Amer: 15 mL/min — ABNORMAL LOW (ref >90)
GFR calc non Af Amer: 13 mL/min — ABNORMAL LOW (ref >90)
Glucose, Bld: 130 mg/dL — ABNORMAL HIGH (ref 70–99)
POTASSIUM: 3.5 meq/L — AB (ref 3.7–5.3)
Sodium: 130 mEq/L — ABNORMAL LOW (ref 137–147)

## 2013-12-13 LAB — GLUCOSE, CAPILLARY
Glucose-Capillary: 116 mg/dL — ABNORMAL HIGH (ref 70–99)
Glucose-Capillary: 198 mg/dL — ABNORMAL HIGH (ref 70–99)
Glucose-Capillary: 158 mg/dL — ABNORMAL HIGH (ref 70–99)

## 2013-12-13 LAB — PROTIME-INR
INR: 1.47 (ref 0.00–1.49)
Prothrombin Time: 17.8 seconds — ABNORMAL HIGH (ref 11.6–15.2)

## 2013-12-13 MED ORDER — NYSTATIN 100000 UNIT/GM EX CREA: TOPICAL_CREAM | Freq: Three times a day (TID) | CUTANEOUS | Status: DC

## 2013-12-13 MED ORDER — LACOSAMIDE 50 MG PO TABS
100.0000 mg | ORAL_TABLET | Freq: Two times a day (BID) | ORAL | Status: DC
  Administered 2013-12-13: 100 mg via ORAL
  Filled 2013-12-13: qty 2

## 2013-12-13 MED ORDER — CITALOPRAM HYDROBROMIDE 20 MG PO TABS: 20.0000 mg | ORAL_TABLET | Freq: Every day | ORAL | Status: DC

## 2013-12-13 MED ORDER — INSULIN ASPART 100 UNIT/ML FLEXPEN
5.0000 [IU] | PEN_INJECTOR | Freq: Three times a day (TID) | SUBCUTANEOUS | Status: DC
Start: 2013-12-13 — End: 2013-12-13

## 2013-12-13 MED ORDER — INSULIN ASPART 100 UNIT/ML FLEXPEN: 5.0000 [IU] | PEN_INJECTOR | Freq: Three times a day (TID) | SUBCUTANEOUS | Status: DC

## 2013-12-13 MED ORDER — INSULIN GLARGINE 100 UNIT/ML ~~LOC~~ SOLN: 20.0000 [IU] | Freq: Two times a day (BID) | SUBCUTANEOUS | Status: DC

## 2013-12-13 MED ORDER — WARFARIN SODIUM 1 MG PO TABS: 1.0000 mg | ORAL_TABLET | Freq: Every day | ORAL | Status: DC

## 2013-12-13 MED ORDER — FUROSEMIDE 40 MG PO TABS: 40.0000 mg | ORAL_TABLET | Freq: Every day | ORAL | Status: DC

## 2013-12-13 MED ORDER — INSULIN ASPART 100 UNIT/ML ~~LOC~~ SOLN: 0.0000 [IU] | Freq: Three times a day (TID) | SUBCUTANEOUS | Status: DC

## 2013-12-13 MED ORDER — WARFARIN SODIUM 2 MG PO TABS
2.0000 mg | ORAL_TABLET | Freq: Every day | ORAL | Status: DC
  Filled 2013-12-13: qty 1

## 2013-12-13 MED ORDER — POTASSIUM CHLORIDE CRYS ER 20 MEQ PO TBCR
30.0000 meq | EXTENDED_RELEASE_TABLET | Freq: Once | ORAL | Status: AC
  Administered 2013-12-13: 30 meq via ORAL
  Filled 2013-12-13: qty 1

## 2013-12-13 MED ORDER — METOPROLOL TARTRATE 12.5 MG HALF TABLET: 12.5000 mg | ORAL_TABLET | Freq: Two times a day (BID) | ORAL | Status: DC

## 2013-12-13 MED ORDER — PANTOPRAZOLE SODIUM 40 MG PO TBEC: 40.0000 mg | DELAYED_RELEASE_TABLET | Freq: Every day | ORAL | Status: DC

## 2013-12-13 MED ORDER — GLUCERNA SHAKE PO LIQD: 237.0000 mL | Freq: Three times a day (TID) | ORAL | Status: DC

## 2013-12-13 NOTE — Progress Notes (Signed)
DC central line per MD orders and protocol; pt in bed with call bell within reach; will continue to monitor.  Rowe Pavy, RN

## 2013-12-13 NOTE — Clinical Social Work Note (Signed)
Clinical Social Worker facilitated patient discharge including contacting patient family and facility to confirm patient discharge plans.  Clinical information faxed to facility and family agreeable with plan.  CSW arranged ambulance transport via [facility] to [facility] [place].  RN to call report prior to discharge.  Clinical Social Worker will sign off for now as social work intervention is no longer needed. Please consult [name] again if new need arises.  Barbette Or, [place]

## 2013-12-13 NOTE — Progress Notes (Signed)
DC IV and tele per orders and protocol; report called to SNF.  Rowe Pavy, RN

## 2013-12-13 NOTE — Progress Notes (Signed)
Physical Therapy Treatment Patient Details Name: Claudia [name] MRN: [id number] DOB: 09-Sep-1931 Today's Date: 01/02/2014    History of Present Illness 80 y.o. female admitted to [facility] on 11/05/13 with chest pain.  She is now s/p emergency CABG x 2 and MVR on 11/09/13.  PMHx includes HTN, MI, DM.  Post-op course complicated by possible seizure activity.  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 Suspected cefepime induced neurotoxicity as an etiology    PT Comments    Pt making good progress with mobility. Pt motivated to improve mobility.  Follow Up Recommendations  SNF     Equipment Recommendations  None recommended by PT    Recommendations for Other Services       Precautions / Restrictions Precautions Precautions: Fall;Sternal    Mobility  Bed Mobility                  Transfers Overall transfer level: Needs assistance Equipment used: Rolling walker (2 wheeled) Transfers: Sit to/from Stand Sit to Stand: +2 physical assistance;Mod assist         General transfer comment: Assist to bring hips up and for posterior lean. Verbal cues for pt to place hands on knees.  Ambulation/Gait Ambulation/Gait assistance: Mod assist;+2 safety/equipment Ambulation Distance (Feet): 18 Feet (18' x 1, 10' x 1) Assistive device: Rolling walker (2 wheeled) Gait Pattern/deviations: Step-through pattern;Decreased step length - right;Decreased step length - left;Leaning posteriorly;Trunk flexed Gait velocity: slow Gait velocity interpretation: Below normal speed for age/gender General Gait Details: Assist for balance due to posterior lean.    Stairs            Wheelchair Mobility    Modified Rankin (Stroke Patients Only)       Balance Overall balance assessment: Needs assistance Sitting-balance support: No upper extremity supported Sitting balance-Leahy Scale: Fair     Standing balance support: Bilateral upper extremity supported Standing  balance-Leahy Scale: Poor Standing balance comment: Requires walker and min to mod A to maintain static standing due to posterior lean.                    Cognition Arousal/Alertness: Awake/alert Behavior During Therapy: WFL for tasks assessed/performed Overall Cognitive Status: Within Functional Limits for tasks assessed                      Exercises      General Comments        Pertinent Vitals/Pain Pain Assessment: No/denies pain    Home Living                      Prior Function            PT Goals (current goals can now be found in the care plan section) Progress towards PT goals: Progressing toward goals    Frequency  Min 2X/week    PT Plan Current plan remains appropriate    Co-evaluation             End of Session Equipment Utilized During Treatment: Gait belt Activity Tolerance: Patient tolerated treatment well Patient left: in chair;with call bell/phone within reach     Time: 1107-1119 PT Time Calculation (min): 12 min  Charges:  $Gait Training: 8-22 mins                    G Codes:      [name] Jan 02, 2014, 1:29 PM  [employer] PT [phone]

## 2013-12-13 NOTE — Progress Notes (Addendum)
      [address]       [place] [address]             [phone]        35 Days Post-Op Procedure(s) (LRB): CORONARY ARTERY BYPASS GRAFTING (CABG), on pump, times two, using left internal mammary artery, cryo saphenous vein. (N/A) INTRAOPERATIVE TRANSESOPHAGEAL ECHOCARDIOGRAM (N/A) MITRAL VALVE (MV) REPLACEMENT (N/A)  Subjective: Patient just waking up. She has no specific complaints this am.  Objective: Vital signs in last 24 hours: Temp:  [97.6 F (36.4 C)-98 F (36.7 C)] 97.7 F (36.5 C) (10/09 0500) Pulse Rate:  [73-76] 73 (10/09 0500) Cardiac Rhythm:  [-] Normal sinus rhythm (10/08 2006) Resp:  [16-18] 18 (10/09 0500) BP: (123-146)/(53-68) 123/59 mmHg (10/09 0500) SpO2:  [96 %-100 %] 99 % (10/09 0500) Weight:  [181 lb 3.2 oz (82.192 kg)] 181 lb 3.2 oz (82.192 kg) (10/09 0500)   Current Weight  12/13/13 181 lb 3.2 oz (82.192 kg)      Intake/Output from previous day: 10/08 0701 - 10/09 0700 In: 500 [P.O.:480; I.V.:20] Out: 600 [Urine:600]   Physical Exam:  Cardiovascular: RRR Pulmonary: Clear to auscultation bilaterally; no rales, wheezes, or rhonchi. Abdomen: Soft, non tender, bowel sounds present. Extremities: +1 bilateral lower extremity edema. Wounds: Clean and dry.  No erythema or signs of infection.  Lab Results: CBC:  Recent Labs  12/11/13 0500  WBC 4.6  HGB 9.9*  HCT 30.1*  PLT 225   BMET:   Recent Labs  12/12/13 0540 12/13/13 0429  NA 131* 130*  K 3.8 3.5*  CL 91* 91*  CO2 27 25  GLUCOSE 135* 130*  BUN 42* 39*  CREATININE 3.11* 3.14*  CALCIUM 8.2* 8.4    PT/INR:  Lab Results  Component Value Date   INR 1.47 12/13/2013   INR 1.54* 12/12/2013   INR 1.63* 12/11/2013   ABG:  INR: Will add last result for INR, ABG once components are confirmed Will add last 4 CBG results once components are confirmed  Assessment/Plan:  1. CV - SR in the 80's. On  Lopressor 12.5 bid, and Coumadin. INR decreased from 1.54  to 1.47. Per Dr. Prescott Gum, INR to be between 1.5-2. Continue with 1 mg daily. 2.  Pulmonary - Encourage incentive spirometer 3. Volume Overload - On Lasix 80 daily. As discussed with Dr. Prescott Gum, decrease to 40 daily at discharge 4.  Acute blood loss anemia - Last H and H 9.9 and 30.1.On Aranesp. 5. Renal insufficiency-creatinine remains 3.14 6. DM-CBGs 190/127/116. On Insulin. Pre op HGA1C 7.2. Monitor. 7. Neurology-mental status stable. On VIMAT (stop date is 10/09)  8. Hyponatremia-sodium stable at 130 9. Hypokalemia-gently supplement as creatinine 3.14 9. SNF today  ZIMMERMAN,DONIELLE MPA-C 12/13/2013,7:26 AM  patient examined and medical record reviewed,agree with above note. VAN TRIGT III,PETER 12/13/2013

## 2013-12-17 NOTE — Discharge Summary (Signed)
patient examined and medical record reviewed,agree with above note. VAN TRIGT III,PETER 12/17/2013   

## 2013-12-23 ENCOUNTER — Inpatient Hospital Stay (HOSPITAL_COMMUNITY)
Admission: EM | Admit: 2013-12-23 | Discharge: 2013-12-31 | DRG: 292 | Disposition: A | Discharge status: Skilled Nursing Facility | Payer: Medicare HMO | Attending: Internal Medicine | Admitting: Internal Medicine

## 2013-12-23 ENCOUNTER — Encounter (HOSPITAL_COMMUNITY): Payer: Self-pay | Admitting: Emergency Medicine

## 2013-12-23 ENCOUNTER — Emergency Department (HOSPITAL_COMMUNITY): Payer: Medicare HMO

## 2013-12-23 DIAGNOSIS — I480 Paroxysmal atrial fibrillation: Secondary | ICD-10-CM

## 2013-12-23 DIAGNOSIS — Z85828 Personal history of other malignant neoplasm of skin: Secondary | ICD-10-CM | POA: Diagnosis not present

## 2013-12-23 DIAGNOSIS — N390 Urinary tract infection, site not specified: Secondary | ICD-10-CM | POA: Diagnosis present

## 2013-12-23 DIAGNOSIS — D638 Anemia in other chronic diseases classified elsewhere: Secondary | ICD-10-CM | POA: Diagnosis present

## 2013-12-23 DIAGNOSIS — E1122 Type 2 diabetes mellitus with diabetic chronic kidney disease: Secondary | ICD-10-CM | POA: Diagnosis present

## 2013-12-23 DIAGNOSIS — E11649 Type 2 diabetes mellitus with hypoglycemia without coma: Secondary | ICD-10-CM | POA: Diagnosis not present

## 2013-12-23 DIAGNOSIS — I2510 Atherosclerotic heart disease of native coronary artery without angina pectoris: Secondary | ICD-10-CM | POA: Diagnosis present

## 2013-12-23 DIAGNOSIS — A047 Enterocolitis due to Clostridium difficile: Secondary | ICD-10-CM | POA: Diagnosis present

## 2013-12-23 DIAGNOSIS — Z886 Allergy status to analgesic agent status: Secondary | ICD-10-CM

## 2013-12-23 DIAGNOSIS — R7881 Bacteremia: Secondary | ICD-10-CM | POA: Diagnosis present

## 2013-12-23 DIAGNOSIS — E1165 Type 2 diabetes mellitus with hyperglycemia: Secondary | ICD-10-CM | POA: Diagnosis present

## 2013-12-23 DIAGNOSIS — Z794 Long term (current) use of insulin: Secondary | ICD-10-CM | POA: Diagnosis not present

## 2013-12-23 DIAGNOSIS — I509 Heart failure, unspecified: Secondary | ICD-10-CM

## 2013-12-23 DIAGNOSIS — I249 Acute ischemic heart disease, unspecified: Secondary | ICD-10-CM

## 2013-12-23 DIAGNOSIS — K219 Gastro-esophageal reflux disease without esophagitis: Secondary | ICD-10-CM | POA: Diagnosis present

## 2013-12-23 DIAGNOSIS — I129 Hypertensive chronic kidney disease with stage 1 through stage 4 chronic kidney disease, or unspecified chronic kidney disease: Secondary | ICD-10-CM | POA: Diagnosis present

## 2013-12-23 DIAGNOSIS — I5030 Unspecified diastolic (congestive) heart failure: Secondary | ICD-10-CM

## 2013-12-23 DIAGNOSIS — E8809 Other disorders of plasma-protein metabolism, not elsewhere classified: Secondary | ICD-10-CM | POA: Diagnosis present

## 2013-12-23 DIAGNOSIS — I4891 Unspecified atrial fibrillation: Secondary | ICD-10-CM | POA: Diagnosis present

## 2013-12-23 DIAGNOSIS — Z951 Presence of aortocoronary bypass graft: Secondary | ICD-10-CM

## 2013-12-23 DIAGNOSIS — E875 Hyperkalemia: Secondary | ICD-10-CM

## 2013-12-23 DIAGNOSIS — E7800 Pure hypercholesterolemia, unspecified: Secondary | ICD-10-CM

## 2013-12-23 DIAGNOSIS — E8770 Fluid overload, unspecified: Secondary | ICD-10-CM

## 2013-12-23 DIAGNOSIS — I200 Unstable angina: Secondary | ICD-10-CM

## 2013-12-23 DIAGNOSIS — E785 Hyperlipidemia, unspecified: Secondary | ICD-10-CM | POA: Diagnosis present

## 2013-12-23 DIAGNOSIS — Z953 Presence of xenogenic heart valve: Secondary | ICD-10-CM | POA: Diagnosis not present

## 2013-12-23 DIAGNOSIS — Z8711 Personal history of peptic ulcer disease: Secondary | ICD-10-CM

## 2013-12-23 DIAGNOSIS — R627 Adult failure to thrive: Secondary | ICD-10-CM | POA: Diagnosis present

## 2013-12-23 DIAGNOSIS — Z952 Presence of prosthetic heart valve: Secondary | ICD-10-CM

## 2013-12-23 DIAGNOSIS — E1121 Type 2 diabetes mellitus with diabetic nephropathy: Secondary | ICD-10-CM

## 2013-12-23 DIAGNOSIS — Z7901 Long term (current) use of anticoagulants: Secondary | ICD-10-CM | POA: Diagnosis not present

## 2013-12-23 DIAGNOSIS — I10 Essential (primary) hypertension: Secondary | ICD-10-CM

## 2013-12-23 DIAGNOSIS — I5033 Acute on chronic diastolic (congestive) heart failure: Secondary | ICD-10-CM | POA: Diagnosis present

## 2013-12-23 DIAGNOSIS — I252 Old myocardial infarction: Secondary | ICD-10-CM

## 2013-12-23 DIAGNOSIS — E669 Obesity, unspecified: Secondary | ICD-10-CM | POA: Diagnosis present

## 2013-12-23 DIAGNOSIS — Z79899 Other long term (current) drug therapy: Secondary | ICD-10-CM | POA: Diagnosis not present

## 2013-12-23 DIAGNOSIS — I214 Non-ST elevation (NSTEMI) myocardial infarction: Secondary | ICD-10-CM

## 2013-12-23 DIAGNOSIS — Z6831 Body mass index (BMI) 31.0-31.9, adult: Secondary | ICD-10-CM

## 2013-12-23 DIAGNOSIS — R197 Diarrhea, unspecified: Secondary | ICD-10-CM

## 2013-12-23 DIAGNOSIS — M8580 Other specified disorders of bone density and structure, unspecified site: Secondary | ICD-10-CM

## 2013-12-23 DIAGNOSIS — Z955 Presence of coronary angioplasty implant and graft: Secondary | ICD-10-CM

## 2013-12-23 DIAGNOSIS — A0472 Enterocolitis due to Clostridium difficile, not specified as recurrent: Secondary | ICD-10-CM

## 2013-12-23 DIAGNOSIS — M869 Osteomyelitis, unspecified: Secondary | ICD-10-CM

## 2013-12-23 DIAGNOSIS — E871 Hypo-osmolality and hyponatremia: Secondary | ICD-10-CM

## 2013-12-23 DIAGNOSIS — N184 Chronic kidney disease, stage 4 (severe): Secondary | ICD-10-CM

## 2013-12-23 DIAGNOSIS — R600 Localized edema: Secondary | ICD-10-CM | POA: Diagnosis present

## 2013-12-23 DIAGNOSIS — E119 Type 2 diabetes mellitus without complications: Secondary | ICD-10-CM

## 2013-12-23 LAB — COMPREHENSIVE METABOLIC PANEL
ALBUMIN: 2.7 g/dL — AB (ref 3.5–5.2)
ALK PHOS: 103 U/L (ref 39–117)
ALT: 16 U/L (ref 0–35)
AST: 14 U/L (ref 0–37)
Anion gap: 12 (ref 5–15)
BUN: 36 mg/dL — ABNORMAL HIGH (ref 6–23)
CO2: 27 mEq/L (ref 19–32)
Calcium: 8.7 mg/dL (ref 8.4–10.5)
Chloride: 86 mEq/L — ABNORMAL LOW (ref 96–112)
Creatinine, Ser: 2.51 mg/dL — ABNORMAL HIGH (ref 0.50–1.10)
GFR calc Af Amer: 20 mL/min — ABNORMAL LOW (ref >90)
GFR calc non Af Amer: 17 mL/min — ABNORMAL LOW (ref >90)
Glucose, Bld: 183 mg/dL — ABNORMAL HIGH (ref 70–99)
POTASSIUM: 5 meq/L (ref 3.7–5.3)
SODIUM: 125 meq/L — AB (ref 137–147)
TOTAL PROTEIN: 6.6 g/dL (ref 6.0–8.3)
Total Bilirubin: 0.5 mg/dL (ref 0.3–1.2)

## 2013-12-23 LAB — CBC
HCT: 31.8 % — ABNORMAL LOW (ref 36.0–46.0)
Hemoglobin: 10.4 g/dL — ABNORMAL LOW (ref 12.0–15.0)
MCH: 27.2 pg (ref 26.0–34.0)
MCHC: 32.7 g/dL (ref 30.0–36.0)
MCV: 83 fL (ref 78.0–100.0)
Platelets: 226 10*3/uL (ref 150–400)
RBC: 3.83 MIL/uL — AB (ref 3.87–5.11)
RDW: 15.5 % (ref 11.5–15.5)
WBC: 10.4 10*3/uL (ref 4.0–10.5)

## 2013-12-23 LAB — I-STAT TROPONIN, ED: TROPONIN I, POC: 0.02 ng/mL (ref 0.00–0.08)

## 2013-12-23 LAB — PRO B NATRIURETIC PEPTIDE: Pro B Natriuretic peptide (BNP): 11963 pg/mL — ABNORMAL HIGH (ref 0–450)

## 2013-12-23 LAB — PROTIME-INR
INR: 1.37 (ref 0.00–1.49)
Prothrombin Time: 17 seconds — ABNORMAL HIGH (ref 11.6–15.2)

## 2013-12-23 MED ORDER — ONDANSETRON HCL 4 MG/2ML IJ SOLN
4.0000 mg | Freq: Once | INTRAMUSCULAR | Status: AC
  Administered 2013-12-23: 4 mg via INTRAVENOUS
  Filled 2013-12-23: qty 2

## 2013-12-23 MED ORDER — FUROSEMIDE 10 MG/ML IJ SOLN
40.0000 mg | Freq: Once | INTRAMUSCULAR | Status: AC
  Administered 2013-12-23: 40 mg via INTRAVENOUS
  Filled 2013-12-23: qty 4

## 2013-12-23 NOTE — ED Provider Notes (Cosign Needed)
CSN: [id number]     Arrival date & time 12/23/13  1730 History   First MD Initiated Contact with Patient 12/23/13 1805     Chief Complaint  Patient presents with  . Leg Swelling     (Consider location/radiation/quality/duration/timing/severity/associated sxs/prior Treatment) HPI Claudia [name] is a 80 y.o. female with PMH of DM, MI, CHF s/p mitral valve replacement and CABG 07/09/54 complicated by renal insufficiency and atrial fibrillation presenting from Dr. Mila Palmer office with bilateral peripheral edema, and serous drainage of incision on medial left leg. Concerns for wound healing with extensive peripheral edema. Patient found to be hyponatremia and given a salt pill. Patient denies chest pain, shortness of breath, some nausea, no emesis, abdominal, or back pain. No headaches, visual changes. Generalized weakness. Patient diagnosed with UTI and currently undergoing treatment.    Past Medical History  Diagnosis Date  . GERD (gastroesophageal reflux disease)   . PUD (peptic ulcer disease)   . Anemia   . Hypertension   . S/P endoscopy Aug 2011    3 superficial gastric ulcers, NSAID-induced  . S/P colonoscopy Sept 2011    left-sided diverticula, tubular adenoma  . Coronary artery disease   . Shortness of breath   . Diabetes mellitus     insulin dependent  . Headache(784.0)     rare migraines  . Cancer     hx of skin cancer  . Arthritis   . Osteopenia   . Hypercholesterolemia   . SIADH (syndrome of inappropriate ADH production)   . CHF (congestive heart failure)   . Myocardial infarction 2013   Past Surgical History  Procedure Laterality Date  . Tonsillectomy    . Appendectomy    . Eye surgery      cataracts  . Cardiac stents  07/19/2011  . Esophagogastroduodenoscopy  10/16/09    normal without barrett's/three superficial gastric ulcers  . Colonoscopy  12/04/09    normal rectum/left-sided diverticula  . Joint replacement  arthroscopy to knee  . Toe amputation Left 2013     left second toe amputation  . Cardiac catheterization  01/22/2013  . Coronary angioplasty  07/2011  . Coronary artery bypass graft N/A 11/08/2013    Procedure: CORONARY ARTERY BYPASS GRAFTING (CABG), on pump, times two, using left internal mammary artery, cryo saphenous vein.;  Surgeon: Ivin Poot, MD;  Location: [facility];  Service: Open Heart Surgery;  Laterality: N/A;  LIMA-LAD CRYOVEIN -OM  . Intraoperative transesophageal echocardiogram N/A 11/08/2013    Procedure: INTRAOPERATIVE TRANSESOPHAGEAL ECHOCARDIOGRAM;  Surgeon: Ivin Poot, MD;  Location: [facility];  Service: Open Heart Surgery;  Laterality: N/A;  . Mitral valve replacement N/A 11/08/2013    Procedure: MITRAL VALVE (MV) REPLACEMENT;  Surgeon: Ivin Poot, MD;  Location: [facility];  Service: Open Heart Surgery;  Laterality: N/A;  #25 MAGNA MITRAL EASE   History reviewed. No pertinent family history. History  Substance Use Topics  . Smoking status: Never Smoker   . Smokeless tobacco: Never Used  . Alcohol Use: No   OB History   Grav Para Term Preterm Abortions TAB SAB Ect Mult Living                 Review of Systems  Constitutional: Negative for fever and chills.  HENT: Negative for congestion and rhinorrhea.   Eyes: Negative for visual disturbance.  Respiratory: Negative for cough and shortness of breath.   Cardiovascular: Negative for chest pain and palpitations.  Gastrointestinal: Positive for nausea. Negative for  vomiting and diarrhea.  Genitourinary: Negative for dysuria and hematuria.  Musculoskeletal: Negative for back pain and gait problem.  Skin: Positive for wound.  Neurological: Positive for weakness. Negative for headaches.      Allergies  Aspirin  Home Medications   Prior to Admission medications   Medication Sig Start Date End Date Taking? Authorizing Provider  acetaminophen (TYLENOL) 500 MG tablet Take 1,000 mg by mouth daily as needed (pain).   Yes Historical Provider, MD  acetaminophen  (TYLENOL) 500 MG tablet Take 1,000 mg by mouth every 6 (six) hours as needed.   Yes Historical Provider, MD  B-D ULTRAFINE III SHORT PEN 31G X 8 MM MISC USE AS DIRECTED WITH LANTUS PEN 11/21/12  Yes Susy Frizzle, MD  Blood Glucose Monitoring Suppl (ACCU-CHEK NANO SMARTVIEW) W/DEVICE KIT Pt needs monitor, strips (box of 400/ 4 refill), lancets (box of 400/4 refills) She checks her BS qid (before meals & QHS) Dx: 250.00 02/13/13  Yes Susy Frizzle, MD  cefTRIAXone (ROCEPHIN) 1 G injection Inject 1 g into the muscle once. Started on 12-22-13 for 7 days   Yes Historical Provider, MD  citalopram (CELEXA) 20 MG tablet Take 1 tablet (20 mg total) by mouth daily. 12/13/13  Yes Donielle Liston Alba, PA-C  feeding supplement, GLUCERNA SHAKE, (GLUCERNA SHAKE) LIQD Take 237 mLs by mouth 3 (three) times daily with meals. 12/13/13  Yes Donielle Liston Alba, PA-C  furosemide (LASIX) 80 MG tablet Take 80 mg by mouth daily.   Yes Historical Provider, MD  insulin aspart (NOVOLOG FLEXPEN) 100 UNIT/ML FlexPen Inject 5-10 Units into the skin 3 (three) times daily with meals. 70-120=0    251-300=5 121-150=1  301-350=7 151-200=2  351-400=9 201-250=3  400 or >, call MD  units of insulin 12/13/13  Yes Donielle Liston Alba, PA-C  insulin glargine (LANTUS) 100 UNIT/ML injection Inject 0.2 mLs (20 Units total) into the skin 2 (two) times daily. 12/13/13  Yes Donielle Liston Alba, PA-C  Melatonin 3 MG TABS Take 3 mg by mouth at bedtime as needed (insomnia).   Yes Historical Provider, MD  metoprolol tartrate (LOPRESSOR) 12.5 mg TABS tablet Take 0.5 tablets (12.5 mg total) by mouth 2 (two) times daily. 12/13/13  Yes Donielle Liston Alba, PA-C  nystatin cream (MYCOSTATIN) Apply topically 3 (three) times daily. 12/13/13  Yes Donielle Liston Alba, PA-C  pantoprazole (PROTONIX) 40 MG tablet Take 1 tablet (40 mg total) by mouth daily. 12/13/13  Yes Donielle Liston Alba, PA-C  simvastatin (ZOCOR) 20 MG tablet Take 1 tablet (20 mg total)  by mouth every evening. 08/15/12  Yes Susy Frizzle, MD  warfarin (COUMADIN) 2 MG tablet Take 2 mg by mouth daily. Only from 12-20-08-12-22-13 per nursing home mar.  then patient was going up to 3 mg   Yes Historical Provider, MD   BP 122/51  Pulse 69  Temp(Src) 97.9 F (36.6 C) (Oral)  Resp 17  SpO2 97% Physical Exam  Nursing note and vitals reviewed. Constitutional: She appears well-developed and well-nourished. No distress.  HENT:  Head: Normocephalic and atraumatic.  Eyes: Conjunctivae and EOM are normal. Right eye exhibits no discharge. Left eye exhibits no discharge.  Neck: JVD present.  Cardiovascular: Normal rate, regular rhythm and normal heart sounds.   Bilateral equal peripheral 2+ pitting edema.   Pulmonary/Chest: Effort normal and breath sounds normal. No respiratory distress. She has no wheezes.  Abdominal: Soft. Bowel sounds are normal. She exhibits no distension. There is no tenderness.  Musculoskeletal:  5cm  incision to left medial leg above malleolus with serous drainage. No induration, mild erythema and td. no warmth or signs of cellulitis.  Neurological: She is alert. She exhibits normal muscle tone. Coordination normal.  Skin: Skin is warm and dry. She is not diaphoretic.    ED Course  Procedures (including critical care time) Labs Review Labs Reviewed  PRO B NATRIURETIC PEPTIDE - Abnormal; Notable for the following:    Pro B Natriuretic peptide (BNP) 11963.0 (*)    All other components within normal limits  CBC - Abnormal; Notable for the following:    RBC 3.83 (*)    Hemoglobin 10.4 (*)    HCT 31.8 (*)    All other components within normal limits  COMPREHENSIVE METABOLIC PANEL - Abnormal; Notable for the following:    Sodium 125 (*)    Chloride 86 (*)    Glucose, Bld 183 (*)    BUN 36 (*)    Creatinine, Ser 2.51 (*)    Albumin 2.7 (*)    GFR calc non Af Amer 17 (*)    GFR calc Af Amer 20 (*)    All other components within normal limits   PROTIME-INR - Abnormal; Notable for the following:    Prothrombin Time 17.0 (*)    All other components within normal limits  I-STAT TROPOININ, ED    Imaging Review Dg Chest 2 View  12/23/2013   CLINICAL DATA:  Short of breath. Legs and ankle swelling. CHF. History of hypertension and coronary artery disease.  EXAM: CHEST  2 VIEW  COMPARISON:  12/10/2013.  FINDINGS: Stable changes of cardiac surgery and mitral valve replacement. Left coronary artery stents, stable. Cardiac silhouette is normal in size and configuration. No convincing mediastinal or hilar masses.  There is linear and coarse reticular opacity in the left lower lobe with some hazy type opacity that projects in the left mid lung. This is similar to the prior study. Small bilateral effusions are noted, also present previous. No convincing pulmonary edema. No pneumothorax.  IMPRESSION: 1. No convincing acute cardiopulmonary disease. 2. Left lower lobe opacity is likely chronic atelectasis/scarring related to prior cardiac surgery. No convincing infiltrate. 3. Small persistent bilateral pleural effusions.   Electronically Signed   By: Lajean Manes M.D.   On: 12/23/2013 20:08     EKG Interpretation   Date/Time:  Monday December 23 2013 19:08:19 EDT Ventricular Rate:  79 PR Interval:  158 QRS Duration: 102 QT Interval:  403 QTC Calculation: 462 R Axis:   49 Text Interpretation:  Sinus rhythm Probable left atrial enlargement  Non-specific ST-t changes Inferolateral leads Compared to previous tracing  Anterolateral leads ST-t abnormality less evident Confirmed by [facility]  MD,  Jenny Reichmann ([id number]) on 12/23/2013 7:12:45 PM      MDM   Final diagnoses:  Hypervolemia, unspecified hypervolemia type   Patient with multiple comorbidities presenting after CABG and mitral valve replacement 11/08/13 from SNF with peripheral edema, JVD and serous drainage of incision site. Patient sent by her cardiologist. Patient complains of nausea but on other  complaints. VSS.  RRR, lungs CTAB. Peripheral edema and JVD consistent with volume overload. Pt's BNP almost 12,000. Other labs at baseline except for hyponatremia. CXR without convincing infiltrate or acute cardiopulmonary disease. Patient with hypervolemia. Given 40m laxis IV. Consult to [facility] hospitalist for admission to telemetry bed.   Discussed all results and patient verbalizes understanding and agrees with plan.  This is a shared patient. This patient was discussed with the physician  who saw and evaluated the patient and agrees with the plan.     Pura Spice, PA-C 12/24/13 [phone]

## 2013-12-23 NOTE — ED Notes (Signed)
Pt sent here from Dr. Kirt Boys be admitted to step down-we have no step down beds at this time- Dr. Allyson Sabal accepted patient. Pt has bilateral weeping edema and is one month post open heart surgery. Denies SOB and chest pain.

## 2013-12-23 NOTE — ED Provider Notes (Signed)
Medical screening examination/treatment/procedure(s) were conducted as a shared visit with non-physician practitioner(s) and myself.  I personally evaluated the patient during the encounter.   EKG Interpretation   Date/Time:  Monday December 23 2013 19:08:19 EDT Ventricular Rate:  79 PR Interval:  158 QRS Duration: 102 QT Interval:  403 QTC Calculation: 462 R Axis:   49 Text Interpretation:  Sinus rhythm Probable left atrial enlargement  Non-specific ST-t changes Inferolateral leads Compared to previous tracing  Anterolateral leads ST-t abnormality less evident Confirmed by [facility]  MD,  Jenny Reichmann ([id number]) on 12/23/2013 7:12:41 PM     80 year old female currently has been at a skilled nursing facility for rehabilitation for about the last 1-1/2 weeks after a five-week hospitalization for chest pain resulting in CABG with mitral valve replacement complicated by postoperative atrial fibrillation, 50 pound weight gain, encephalopathy, renal insufficiency, and patient has improved to the point where she can now sit up with assistance and with a walker and assistance to walk just a couple steps. She was seen by a cardiologist today in clinic and sent to the emergency department for admission to the hospitalist service since no step down beds were available for direct edition for volume overload. Patient's lungs are currently clear with pulse oximetry normal room air 99% with sinus rhythm on cardiac monitor however patient has continued postoperative moderately severe edema to her legs with serous weeping from her incisions with no erythema or purulent drainage from her leg incisions.  D/w [facility] for admit for volume overload. [id number]  Babette Relic, MD 01/05/14 2055

## 2013-12-24 ENCOUNTER — Encounter (HOSPITAL_COMMUNITY): Payer: Self-pay | Admitting: Internal Medicine

## 2013-12-24 DIAGNOSIS — Z951 Presence of aortocoronary bypass graft: Secondary | ICD-10-CM

## 2013-12-24 DIAGNOSIS — E8770 Fluid overload, unspecified: Secondary | ICD-10-CM | POA: Diagnosis present

## 2013-12-24 DIAGNOSIS — R609 Edema, unspecified: Secondary | ICD-10-CM

## 2013-12-24 DIAGNOSIS — I5030 Unspecified diastolic (congestive) heart failure: Secondary | ICD-10-CM

## 2013-12-24 DIAGNOSIS — E1121 Type 2 diabetes mellitus with diabetic nephropathy: Secondary | ICD-10-CM

## 2013-12-24 DIAGNOSIS — N184 Chronic kidney disease, stage 4 (severe): Secondary | ICD-10-CM | POA: Diagnosis present

## 2013-12-24 LAB — GLUCOSE, CAPILLARY
GLUCOSE-CAPILLARY: 116 mg/dL — AB (ref 70–99)
GLUCOSE-CAPILLARY: 96 mg/dL (ref 70–99)
GLUCOSE-CAPILLARY: 141 mg/dL — AB (ref 70–99)
Glucose-Capillary: 171 mg/dL — ABNORMAL HIGH (ref 70–99)
Glucose-Capillary: 145 mg/dL — ABNORMAL HIGH (ref 70–99)
Glucose-Capillary: 121 mg/dL — ABNORMAL HIGH (ref 70–99)

## 2013-12-24 LAB — COMPREHENSIVE METABOLIC PANEL
ALT: 14 U/L (ref 0–35)
ANION GAP: 14 (ref 5–15)
AST: 13 U/L (ref 0–37)
Albumin: 2.6 g/dL — ABNORMAL LOW (ref 3.5–5.2)
Alkaline Phosphatase: 102 U/L (ref 39–117)
BUN: 36 mg/dL — AB (ref 6–23)
CALCIUM: 8.5 mg/dL (ref 8.4–10.5)
CO2: 25 mEq/L (ref 19–32)
Chloride: 89 mEq/L — ABNORMAL LOW (ref 96–112)
Creatinine, Ser: 2.49 mg/dL — ABNORMAL HIGH (ref 0.50–1.10)
GFR calc non Af Amer: 17 mL/min — ABNORMAL LOW (ref >90)
GFR, EST AFRICAN AMERICAN: 20 mL/min — AB (ref >90)
Glucose, Bld: 89 mg/dL (ref 70–99)
POTASSIUM: 4.7 meq/L (ref 3.7–5.3)
SODIUM: 128 meq/L — AB (ref 137–147)
TOTAL PROTEIN: 6 g/dL (ref 6.0–8.3)
Total Bilirubin: 0.4 mg/dL (ref 0.3–1.2)

## 2013-12-24 LAB — CBC WITH DIFFERENTIAL/PLATELET
Basophils Absolute: 0 10*3/uL (ref 0.0–0.1)
Basophils Relative: 0 % (ref 0–1)
Eosinophils Absolute: 0.1 10*3/uL (ref 0.0–0.7)
Eosinophils Relative: 1 % (ref 0–5)
HCT: 29.7 % — ABNORMAL LOW (ref 36.0–46.0)
HEMOGLOBIN: 9.7 g/dL — AB (ref 12.0–15.0)
LYMPHS ABS: 0.6 10*3/uL — AB (ref 0.7–4.0)
Lymphocytes Relative: 8 % — ABNORMAL LOW (ref 12–46)
MCH: 28.3 pg (ref 26.0–34.0)
MCHC: 32.7 g/dL (ref 30.0–36.0)
MCV: 86.6 fL (ref 78.0–100.0)
MONOS PCT: 8 % (ref 3–12)
Monocytes Absolute: 0.7 10*3/uL (ref 0.1–1.0)
NEUTROS ABS: 6.6 10*3/uL (ref 1.7–7.7)
NEUTROS PCT: 83 % — AB (ref 43–77)
Platelets: 234 10*3/uL (ref 150–400)
RBC: 3.43 MIL/uL — ABNORMAL LOW (ref 3.87–5.11)
RDW: 15.6 % — ABNORMAL HIGH (ref 11.5–15.5)
WBC: 8 10*3/uL (ref 4.0–10.5)

## 2013-12-24 LAB — PROTIME-INR
INR: 1.41 (ref 0.00–1.49)
PROTHROMBIN TIME: 17.4 s — AB (ref 11.6–15.2)

## 2013-12-24 LAB — TSH: TSH: 1.61 u[IU]/mL (ref 0.350–4.500)

## 2013-12-24 MED ORDER — ONDANSETRON HCL 4 MG PO TABS: 4.0000 mg | ORAL_TABLET | Freq: Four times a day (QID) | ORAL | Status: DC | PRN

## 2013-12-24 MED ORDER — PANTOPRAZOLE SODIUM 40 MG PO TBEC
40.0000 mg | DELAYED_RELEASE_TABLET | Freq: Every day | ORAL | Status: DC
  Administered 2013-12-24 – 2013-12-30 (×7): 40 mg via ORAL
  Filled 2013-12-24 (×3): qty 1

## 2013-12-24 MED ORDER — WARFARIN SODIUM 4 MG PO TABS
4.0000 mg | ORAL_TABLET | Freq: Once | ORAL | Status: AC
  Administered 2013-12-24: 4 mg via ORAL
  Filled 2013-12-24: qty 1

## 2013-12-24 MED ORDER — DEXTROSE 5 % IV SOLN
1.0000 g | INTRAVENOUS | Status: DC
  Filled 2013-12-24: qty 10

## 2013-12-24 MED ORDER — NYSTATIN 100000 UNIT/GM EX CREA
TOPICAL_CREAM | Freq: Three times a day (TID) | CUTANEOUS | Status: DC
  Filled 2013-12-24 (×2): qty 15

## 2013-12-24 MED ORDER — ACETAMINOPHEN 650 MG RE SUPP
650.0000 mg | Freq: Four times a day (QID) | RECTAL | Status: DC | PRN
Start: 2013-12-24 — End: 2013-12-31

## 2013-12-24 MED ORDER — GLUCERNA SHAKE PO LIQD
237.0000 mL | Freq: Three times a day (TID) | ORAL | Status: DC
  Administered 2013-12-24 – 2013-12-31 (×10): 237 mL via ORAL
  Filled 2013-12-24: qty 237

## 2013-12-24 MED ORDER — ONDANSETRON HCL 4 MG/2ML IJ SOLN
4.0000 mg | Freq: Four times a day (QID) | INTRAMUSCULAR | Status: DC | PRN
  Administered 2013-12-24 – 2013-12-28 (×7): 4 mg via INTRAVENOUS
  Filled 2013-12-24 (×7): qty 2

## 2013-12-24 MED ORDER — WARFARIN - PHARMACIST DOSING INPATIENT
Freq: Every day | Status: DC
  Administered 2013-12-28 – 2013-12-30 (×3)

## 2013-12-24 MED ORDER — FUROSEMIDE 10 MG/ML IJ SOLN
80.0000 mg | Freq: Two times a day (BID) | INTRAMUSCULAR | Status: DC
  Administered 2013-12-24: 80 mg via INTRAVENOUS
  Filled 2013-12-24 (×3): qty 8

## 2013-12-24 MED ORDER — METOPROLOL TARTRATE 12.5 MG HALF TABLET
12.5000 mg | ORAL_TABLET | Freq: Two times a day (BID) | ORAL | Status: DC
  Administered 2013-12-24 – 2013-12-25 (×3): 12.5 mg via ORAL
  Filled 2013-12-24 (×4): qty 1

## 2013-12-24 MED ORDER — WARFARIN SODIUM 3 MG PO TABS
3.0000 mg | ORAL_TABLET | Freq: Once | ORAL | Status: DC
  Filled 2013-12-24: qty 1

## 2013-12-24 MED ORDER — MELATONIN 3 MG PO TABS: 3.0000 mg | ORAL_TABLET | Freq: Every evening | ORAL | Status: DC | PRN

## 2013-12-24 MED ORDER — INSULIN GLARGINE 100 UNIT/ML ~~LOC~~ SOLN
20.0000 [IU] | Freq: Two times a day (BID) | SUBCUTANEOUS | Status: DC
  Administered 2013-12-24 – 2013-12-26 (×5): 20 [IU] via SUBCUTANEOUS
  Filled 2013-12-24 (×8): qty 0.2

## 2013-12-24 MED ORDER — ACETAMINOPHEN 325 MG PO TABS
650.0000 mg | ORAL_TABLET | Freq: Four times a day (QID) | ORAL | Status: DC | PRN
  Administered 2013-12-24: 650 mg via ORAL
  Filled 2013-12-24: qty 2

## 2013-12-24 MED ORDER — FUROSEMIDE 10 MG/ML IJ SOLN
80.0000 mg | Freq: Four times a day (QID) | INTRAMUSCULAR | Status: DC
  Administered 2013-12-24 – 2013-12-26 (×7): 80 mg via INTRAVENOUS
  Filled 2013-12-24 (×10): qty 8

## 2013-12-24 MED ORDER — SIMVASTATIN 20 MG PO TABS
20.0000 mg | ORAL_TABLET | Freq: Every evening | ORAL | Status: DC
  Administered 2013-12-24 – 2013-12-30 (×7): 20 mg via ORAL
  Filled 2013-12-24 (×8): qty 1

## 2013-12-24 MED ORDER — SODIUM CHLORIDE 0.9 % IJ SOLN
3.0000 mL | Freq: Two times a day (BID) | INTRAMUSCULAR | Status: DC
  Administered 2013-12-25 – 2013-12-30 (×10): 3 mL via INTRAVENOUS

## 2013-12-24 MED ORDER — INSULIN ASPART 100 UNIT/ML ~~LOC~~ SOLN
0.0000 [IU] | Freq: Three times a day (TID) | SUBCUTANEOUS | Status: DC
  Administered 2013-12-24: 1 [IU] via SUBCUTANEOUS
  Administered 2013-12-24 – 2013-12-25 (×3): 2 [IU] via SUBCUTANEOUS
  Administered 2013-12-26: 1 [IU] via SUBCUTANEOUS
  Administered 2013-12-27: 2 [IU] via SUBCUTANEOUS
  Administered 2013-12-28 – 2013-12-29 (×3): 1 [IU] via SUBCUTANEOUS
  Administered 2013-12-29: 3 [IU] via SUBCUTANEOUS
  Administered 2013-12-29 – 2013-12-30 (×2): 2 [IU] via SUBCUTANEOUS
  Administered 2013-12-30 – 2013-12-31 (×2): 3 [IU] via SUBCUTANEOUS
  Administered 2013-12-31: 1 [IU] via SUBCUTANEOUS

## 2013-12-24 MED ORDER — SODIUM CHLORIDE 0.9 % IJ SOLN
3.0000 mL | Freq: Two times a day (BID) | INTRAMUSCULAR | Status: DC
  Administered 2013-12-24 – 2013-12-30 (×6): 3 mL via INTRAVENOUS

## 2013-12-24 MED ORDER — CEFTRIAXONE SODIUM 1 G IJ SOLR: 1.0000 g | Freq: Once | INTRAMUSCULAR | Status: DC

## 2013-12-24 MED ORDER — CITALOPRAM HYDROBROMIDE 20 MG PO TABS
20.0000 mg | ORAL_TABLET | Freq: Every day | ORAL | Status: DC
  Administered 2013-12-24 – 2013-12-31 (×8): 20 mg via ORAL
  Filled 2013-12-24 (×8): qty 1

## 2013-12-24 NOTE — Progress Notes (Signed)

## 2013-12-24 NOTE — H&P (Addendum)
[facility] History and Physical  [name] SPQ:[id number] DOB: [birth year] DOA: 12/23/2013  Referring physician: ER physician. PCP: Odette Fraction, MD   Chief Complaint: Lower extremity edema.  HPI: Claudia [name] is a 80 y.o. female with history of CAD who had recent CABG last month and mitral valve replacement with pericardial tissue valve, chronic kidney disease stage 3-4, diabetes mellitus was referred to the ER patient was found to have increasing lower extremity edema. Patient denies any shortness of breath or chest pain but does complain of weakness. Patient is on Lasix 80 mg by mouth daily despite which patient has been found to be increasingly gaining weight and lower extremity edema. Patient recently had CABG and mitral valve replacement which was complicated by bacteremia metabolic encephalopathy and also had fluid overload at the time. Chest x-ray done today does not show any definite signs of CHF. Patient has been admitted for further management and was given Lasix 40 mg IV in the ER.   Review of Systems: As presented in the history of presenting illness, rest negative.  Past Medical History  Diagnosis Date  . GERD (gastroesophageal reflux disease)   . PUD (peptic ulcer disease)   . Anemia   . Hypertension   . S/P endoscopy Aug 2011    3 superficial gastric ulcers, NSAID-induced  . S/P colonoscopy Sept 2011    left-sided diverticula, tubular adenoma  . Coronary artery disease   . Shortness of breath   . Diabetes mellitus     insulin dependent  . Headache(784.0)     rare migraines  . Cancer     hx of skin cancer  . Arthritis   . Osteopenia   . Hypercholesterolemia   . SIADH (syndrome of inappropriate ADH production)   . CHF (congestive heart failure)   . Myocardial infarction 2013   Past Surgical History  Procedure Laterality Date  . Tonsillectomy    . Appendectomy    . Eye surgery      cataracts  . Cardiac stents  07/19/2011  .  Esophagogastroduodenoscopy  10/16/09    normal without barrett's/three superficial gastric ulcers  . Colonoscopy  12/04/09    normal rectum/left-sided diverticula  . Joint replacement  arthroscopy to knee  . Toe amputation Left 2013    left second toe amputation  . Cardiac catheterization  01/22/2013  . Coronary angioplasty  07/2011  . Coronary artery bypass graft N/A 11/08/2013    Procedure: CORONARY ARTERY BYPASS GRAFTING (CABG), on pump, times two, using left internal mammary artery, cryo saphenous vein.;  Surgeon: Ivin Poot, MD;  Location: [place];  Service: Open Heart Surgery;  Laterality: N/A;  LIMA-LAD CRYOVEIN -OM  . Intraoperative transesophageal echocardiogram N/A 11/08/2013    Procedure: INTRAOPERATIVE TRANSESOPHAGEAL ECHOCARDIOGRAM;  Surgeon: Ivin Poot, MD;  Location: [facility];  Service: Open Heart Surgery;  Laterality: N/A;  . Mitral valve replacement N/A 11/08/2013    Procedure: MITRAL VALVE (MV) REPLACEMENT;  Surgeon: Ivin Poot, MD;  Location: [facility];  Service: Open Heart Surgery;  Laterality: N/A;  #25 MAGNA MITRAL EASE   Social History:  reports that she has never smoked. She has never used smokeless tobacco. She reports that she does not drink alcohol or use illicit drugs. Where does patient live nursing home. Can patient participate in ADLs? Not sure.  Allergies  Allergen Reactions  . Aspirin Other (See Comments)    High doses caused stomach bleeds    Family History: History reviewed. No pertinent  family history.    Prior to Admission medications   Medication Sig Start Date End Date Taking? Authorizing Provider  acetaminophen (TYLENOL) 500 MG tablet Take 1,000 mg by mouth daily as needed (pain).   Yes Historical Provider, MD  acetaminophen (TYLENOL) 500 MG tablet Take 1,000 mg by mouth every 6 (six) hours as needed.   Yes Historical Provider, MD  B-D ULTRAFINE III SHORT PEN 31G X 8 MM MISC USE AS DIRECTED WITH LANTUS PEN 11/21/12  Yes Susy Frizzle, MD  Blood  Glucose Monitoring Suppl (ACCU-CHEK NANO SMARTVIEW) W/DEVICE KIT Pt needs monitor, strips (box of 400/ 4 refill), lancets (box of 400/4 refills) She checks her BS qid (before meals & QHS) Dx: 250.00 02/13/13  Yes Susy Frizzle, MD  cefTRIAXone (ROCEPHIN) 1 G injection Inject 1 g into the muscle once. Started on 12-22-13 for 7 days   Yes Historical Provider, MD  citalopram (CELEXA) 20 MG tablet Take 1 tablet (20 mg total) by mouth daily. 12/13/13  Yes Donielle Liston Alba, PA-C  feeding supplement, GLUCERNA SHAKE, (GLUCERNA SHAKE) LIQD Take 237 mLs by mouth 3 (three) times daily with meals. 12/13/13  Yes Donielle Liston Alba, PA-C  furosemide (LASIX) 80 MG tablet Take 80 mg by mouth daily.   Yes Historical Provider, MD  insulin aspart (NOVOLOG FLEXPEN) 100 UNIT/ML FlexPen Inject 5-10 Units into the skin 3 (three) times daily with meals. 70-120=0    251-300=5 121-150=1  301-350=7 151-200=2  351-400=9 201-250=3  400 or >, call MD  units of insulin 12/13/13  Yes Donielle Liston Alba, PA-C  insulin glargine (LANTUS) 100 UNIT/ML injection Inject 0.2 mLs (20 Units total) into the skin 2 (two) times daily. 12/13/13  Yes Donielle Liston Alba, PA-C  Melatonin 3 MG TABS Take 3 mg by mouth at bedtime as needed (insomnia).   Yes Historical Provider, MD  metoprolol tartrate (LOPRESSOR) 12.5 mg TABS tablet Take 0.5 tablets (12.5 mg total) by mouth 2 (two) times daily. 12/13/13  Yes Donielle Liston Alba, PA-C  nystatin cream (MYCOSTATIN) Apply topically 3 (three) times daily. 12/13/13  Yes Donielle Liston Alba, PA-C  pantoprazole (PROTONIX) 40 MG tablet Take 1 tablet (40 mg total) by mouth daily. 12/13/13  Yes Donielle Liston Alba, PA-C  simvastatin (ZOCOR) 20 MG tablet Take 1 tablet (20 mg total) by mouth every evening. 08/15/12  Yes Susy Frizzle, MD  warfarin (COUMADIN) 2 MG tablet Take 2 mg by mouth daily. Only from 12-20-08-12-22-13 per nursing home mar.  then patient was going up to 3 mg   Yes Historical  Provider, MD    Physical Exam: Filed Vitals:   12/23/13 2300 12/23/13 2330 12/24/13 0006 12/24/13 0108  BP: 122/53 122/51 130/53 120/50  Pulse: 70 69 71 71  Temp:    98.7 F (37.1 C)  TempSrc:    Oral  Resp: 16 17 14 18   Weight:    91.4 kg (201 lb 8 oz)  SpO2: 97% 97% 97% 100%     General:  Well-built and nourished.  Eyes: Anicteric no pallor.  ENT: No discharge from ears eyes nose mouth.  Neck: No mass felt.  Cardiovascular: S1-S2 heard.  Respiratory: No rhonchi or crepitations.  Abdomen: Soft nontender bowel sounds present. No guarding rigidity.  Skin: No rash.  Musculoskeletal: Bilateral lower extremity edema.  Psychiatric: Appears normal.  Neurologic: Alert awake oriented to time place and person. Moves all extremity.  Labs on Admission:  Basic Metabolic Panel:  Recent Labs Lab 12/23/13 1843  NA 125*  K 5.0  CL 86*  CO2 27  GLUCOSE 183*  BUN 36*  CREATININE 2.51*  CALCIUM 8.7   Liver Function Tests:  Recent Labs Lab 12/23/13 1843  AST 14  ALT 16  ALKPHOS 103  BILITOT 0.5  PROT 6.6  ALBUMIN 2.7*   No results found for this basename: LIPASE, AMYLASE,  in the last 168 hours No results found for this basename: AMMONIA,  in the last 168 hours CBC:  Recent Labs Lab 12/23/13 1843  WBC 10.4  HGB 10.4*  HCT 31.8*  MCV 83.0  PLT 226   Cardiac Enzymes: No results found for this basename: CKTOTAL, CKMB, CKMBINDEX, TROPONINI,  in the last 168 hours  BNP (last 3 results)  Recent Labs  06/26/13 0042 06/26/13 0539 12/23/13 1843  PROBNP 4422.0* 5499.0* 11963.0*   CBG:  Recent Labs Lab 12/24/13 0119  GLUCAP 116*    Radiological Exams on Admission: Dg Chest 2 View  12/23/2013   CLINICAL DATA:  Short of breath. Legs and ankle swelling. CHF. History of hypertension and coronary artery disease.  EXAM: CHEST  2 VIEW  COMPARISON:  12/10/2013.  FINDINGS: Stable changes of cardiac surgery and mitral valve replacement. Left coronary  artery stents, stable. Cardiac silhouette is normal in size and configuration. No convincing mediastinal or hilar masses.  There is linear and coarse reticular opacity in the left lower lobe with some hazy type opacity that projects in the left mid lung. This is similar to the prior study. Small bilateral effusions are noted, also present previous. No convincing pulmonary edema. No pneumothorax.  IMPRESSION: 1. No convincing acute cardiopulmonary disease. 2. Left lower lobe opacity is likely chronic atelectasis/scarring related to prior cardiac surgery. No convincing infiltrate. 3. Small persistent bilateral pleural effusions.   Electronically Signed   By: Lajean Manes M.D.   On: 12/23/2013 20:08    EKG: Independently reviewed. Normal sinus rhythm.   Assessment/Plan Principal Problem:   Volume overload Active Problems:   DM (diabetes mellitus), type 2   Hypercholesterolemia   CHF (congestive heart failure)   S/P CABG x 2   S/P mitral valve replacement with bioprosthetic valve   CKD (chronic kidney disease) stage 4, GFR 15-29 ml/min   Fluid overload   1. Fluid overload - probably secondary to CHF or worsening kidney function. Patient also has hypoalbuminemia which may be contributing to the edema. At this time I have placed patient on Lasix 80 mg IV every 12 hourly. Closely follow intake output and metabolic pain on daily weights. Patient's last 2-D echo done was 2 weeks ago which showed EF of 50-55%. Check Dopplers of the lower extremity. 2. CAD status post CABG and mitral valve replacement with tissue valve recently last month - patient states she has a catheter to be removed from the chest. Please discuss with patient's cardiothoracic surgeon in a.m. 3. Atrial fibrillation presently in sinus rhythm - continue Coumadin per pharmacy. 4. Diabetes mellitus type 2 - on insulin. Closely follow CBGs. 5. Chronic kidney disease stage 3-4 - see #1. 6. Chronic anemia - follow CBC. 7. Hyperlipidemia -  continue statins. 8. Generalized weakness probably from deconditioning - check TSH. 9. Serratia Marcesans Bacteremia during recent admission and had 15 days of antibiotic therapy. Patient as per nursing home [facility] is on ceftriaxone probably for UTI started on October 18 th. I have discussed with pharmacist and at this time we will hold ceftriaxone recheck UA.    Code Status: Full code.  Family Communication: None.  Disposition Plan: Admit to inpatient.    KAKRAKANDY,ARSHAD N. [facility] Pager [phone].  If 7PM-7AM, please contact night-coverage [email or web address] Password [email or web address] 12/24/2013, 1:25 AM

## 2013-12-24 NOTE — Progress Notes (Signed)
ANTICOAGULATION CONSULT NOTE - Initial Consult  Pharmacy Consult for Coumadin Indication: atrial fibrillation  Allergies  Allergen Reactions  . Aspirin Other (See Comments)    High doses caused stomach bleeds    Patient Measurements: Height: 5\' 7"  (170.2 cm) Weight: 201 lb 8 oz (91.4 kg) IBW/kg (Calculated) : 61.6  Vital Signs: Temp: 98 F (36.7 C) (10/20 0435) Temp Source: Oral (10/20 0435) BP: 111/35 mmHg (10/20 0435) Pulse Rate: 74 (10/20 0435)  Labs:  Recent Labs  12/23/13 1843 12/24/13 0335  HGB 10.4* 9.7*  HCT 31.8* 29.7*  PLT 226 234  LABPROT 17.0* 17.4*  INR 1.37 1.41  CREATININE 2.51* 2.49*    Estimated Creatinine Clearance: 21.3 ml/min (by C-G formula based on Cr of 2.49).   Medical History: Past Medical History  Diagnosis Date  . GERD (gastroesophageal reflux disease)   . PUD (peptic ulcer disease)   . Anemia   . Hypertension   . S/P endoscopy Aug 2011    3 superficial gastric ulcers, NSAID-induced  . S/P colonoscopy Sept 2011    left-sided diverticula, tubular adenoma  . Coronary artery disease   . Shortness of breath   . Diabetes mellitus     insulin dependent  . Headache(784.0)     rare migraines  . Cancer     hx of skin cancer  . Arthritis   . Osteopenia   . Hypercholesterolemia   . SIADH (syndrome of inappropriate ADH production)   . CHF (congestive heart failure)   . Myocardial infarction 2013    Assessment: 80 yo female admitted with CHF exacerbation, h/o Afib, continue on Coumadin, INR 1.41 this morning. Hgb 9.7, plt 234.   PTA dose: 3 mg daily per nursing home MAR, last dose 10/19  Goal of Therapy:  INR 2-3 Monitor platelets by anticoagulation protocol: Yes   Plan:  Coumadin 4 mg po x 1 today Daily INR Monitor CBC, s/sx of bleeding.  Maryanna Shape, PharmD, BCPS  Clinical Pharmacist  Pager: [phone]   12/24/2013,9:42 AM

## 2013-12-24 NOTE — Consult Note (Signed)
CARDIOLOGY CONSULT NOTE  Patient ID: Claudia [name] MRN: [id number] DOB/AGE: 78-Mar-1933 78 y.o.  Admit date: 12/23/2013 Referring Physician  [place] Primary Physician:  Odette Fraction, MD Reason for Consultation  CHF, Fluid overload  HPI: Patient is a 80 year old Caucasian female with history of known coronary artery disease and multiple coronary stenting to her circumflex coronary artery, recently admitted to in the last week of August 2015 with non-ST elevation myocardial infarction, coronary angiography at that time had revealed recurrent restenosis in the circumflex coronary artery. She was referred for CABG, underwent two-vessel CABG with LIMA to LAD and SVG to OM along with replacement of mitral valve with a 25 mm magna mitral Ease bioprosthetic valve. Postoperatively patient had significant complications leading to acute renal failure, sepsis, congestive heart failure, and eventually after the close to 6 week stay in the hospital was discharged home to an assisted-living facility/nursing home for further evaluation prior to being discharged home. However over the past 3-4 days patient has noticed worsening leg edema and yesterday she noticed her leg was oozing through the vein harvest site on the left leg. She was sent over to our office to be evaluated for leg edema, hyponatremia. Patient received 1 g of sodium what appears to be IV for hyponatremia prior to being sent over to my office.   After evaluation in my office, I felt that patient should be in the hospital for diuresis, she was markedly volume overloaded and I was not sure about her renal status and electrolytes. She denied any chest pain or significant worsening dyspnea but stated that she generally felt very poorly, has been very nauseous and has not been able to eat much or keep the food in. Her son is present at the bedside. No GI bleed, no bleeding diathesis, no dizziness or syncope. No pain in the lower exudates although the legs  have been very uncomfortable due to swelling. She has not been able to get up from the wheelchair to the marked leg swelling and generalized weakness. No sternal wound scar discharge. No fever or chills.  Past Medical History  Diagnosis Date  . GERD (gastroesophageal reflux disease)   . PUD (peptic ulcer disease)   . Anemia   . Hypertension   . S/P endoscopy Aug 2011    3 superficial gastric ulcers, NSAID-induced  . S/P colonoscopy Sept 2011    left-sided diverticula, tubular adenoma  . Coronary artery disease   . Shortness of breath   . Diabetes mellitus     insulin dependent  . Headache(784.0)     rare migraines  . Cancer     hx of skin cancer  . Arthritis   . Osteopenia   . Hypercholesterolemia   . SIADH (syndrome of inappropriate ADH production)   . CHF (congestive heart failure)   . Myocardial infarction 2013     Past Surgical History  Procedure Laterality Date  . Tonsillectomy    . Appendectomy    . Eye surgery      cataracts  . Cardiac stents  07/19/2011  . Esophagogastroduodenoscopy  10/16/09    normal without barrett's/three superficial gastric ulcers  . Colonoscopy  12/04/09    normal rectum/left-sided diverticula  . Joint replacement  arthroscopy to knee  . Toe amputation Left 2013    left second toe amputation  . Cardiac catheterization  01/22/2013  . Coronary angioplasty  07/2011  . Coronary artery bypass graft N/A 11/08/2013    Procedure: CORONARY ARTERY BYPASS GRAFTING (CABG), on  pump, times two, using left internal mammary artery, cryo saphenous vein.;  Surgeon: Ivin Poot, MD;  Location: [facility];  Service: Open Heart Surgery;  Laterality: N/A;  LIMA-LAD CRYOVEIN -OM  . Intraoperative transesophageal echocardiogram N/A 11/08/2013    Procedure: INTRAOPERATIVE TRANSESOPHAGEAL ECHOCARDIOGRAM;  Surgeon: Ivin Poot, MD;  Location: [place];  Service: Open Heart Surgery;  Laterality: N/A;  . Mitral valve replacement N/A 11/08/2013    Procedure: MITRAL VALVE (MV)  REPLACEMENT;  Surgeon: Ivin Poot, MD;  Location: [facility];  Service: Open Heart Surgery;  Laterality: N/A;  #25 MAGNA MITRAL EASE     History reviewed. No pertinent family history.   Social History: History   Social History  . Marital Status: Widowed    Spouse Name: N/A    Number of Children: N/A  . Years of Education: N/A   Occupational History  . Not on file.   Social History Main Topics  . Smoking status: Never Smoker   . Smokeless tobacco: Never Used  . Alcohol Use: No  . Drug Use: No  . Sexual Activity: No   Other Topics Concern  . Not on file   Social History Narrative  . No narrative on file     Prescriptions prior to admission  Medication Sig Dispense Refill  . acetaminophen (TYLENOL) 500 MG tablet Take 1,000 mg by mouth daily as needed (pain).      Marland Kitchen acetaminophen (TYLENOL) 500 MG tablet Take 1,000 mg by mouth every 6 (six) hours as needed.      . B-D ULTRAFINE III SHORT PEN 31G X 8 MM MISC USE AS DIRECTED WITH LANTUS PEN  100 each  5  . Blood Glucose Monitoring Suppl (ACCU-CHEK NANO SMARTVIEW) W/DEVICE KIT Pt needs monitor, strips (box of 400/ 4 refill), lancets (box of 400/4 refills) She checks her BS qid (before meals & QHS) Dx: 250.00  1 kit  0  . cefTRIAXone (ROCEPHIN) 1 G injection Inject 1 g into the muscle once. Started on 12-22-13 for 7 days      . citalopram (CELEXA) 20 MG tablet Take 1 tablet (20 mg total) by mouth daily.      . feeding supplement, GLUCERNA SHAKE, (GLUCERNA SHAKE) LIQD Take 237 mLs by mouth 3 (three) times daily with meals.    0  . furosemide (LASIX) 80 MG tablet Take 80 mg by mouth daily.      . insulin aspart (NOVOLOG FLEXPEN) 100 UNIT/ML FlexPen Inject 5-10 Units into the skin 3 (three) times daily with meals. 70-120=0    251-300=5 121-150=1  301-350=7 151-200=2  351-400=9 201-250=3  400 or >, call MD  units of insulin  15 mL  11  . insulin glargine (LANTUS) 100 UNIT/ML injection Inject 0.2 mLs (20 Units total) into the skin 2  (two) times daily.  10 mL  11  . Melatonin 3 MG TABS Take 3 mg by mouth at bedtime as needed (insomnia).      . metoprolol tartrate (LOPRESSOR) 12.5 mg TABS tablet Take 0.5 tablets (12.5 mg total) by mouth 2 (two) times daily.      Marland Kitchen nystatin cream (MYCOSTATIN) Apply topically 3 (three) times daily.  30 g  0  . pantoprazole (PROTONIX) 40 MG tablet Take 1 tablet (40 mg total) by mouth daily.  60 tablet  3  . simvastatin (ZOCOR) 20 MG tablet Take 1 tablet (20 mg total) by mouth every evening.  90 tablet  1  . warfarin (COUMADIN) 2 MG  tablet Take 2 mg by mouth daily. Only from 12-20-08-12-22-13 per nursing home mar.  then patient was going up to 3 mg        Scheduled Meds: . citalopram  20 mg Oral Daily  . feeding supplement (GLUCERNA SHAKE)  237 mL Oral TID WC  . furosemide  80 mg Intravenous BID  . insulin aspart  0-9 Units Subcutaneous TID WC  . insulin glargine  20 Units Subcutaneous BID  . metoprolol tartrate  12.5 mg Oral BID  . nystatin cream   Topical TID  . pantoprazole  40 mg Oral Daily  . simvastatin  20 mg Oral QPM  . sodium chloride  3 mL Intravenous Q12H  . sodium chloride  3 mL Intravenous Q12H  . warfarin  4 mg Oral ONCE-1800  . Warfarin - Pharmacist Dosing Inpatient   Does not apply q1800   Continuous Infusions:  PRN Meds:.acetaminophen, acetaminophen, ondansetron (ZOFRAN) IV, ondansetron  ROS: General: no fevers/chills/night sweats Eyes: no blurry vision, diplopia, or amaurosis ENT: no sore throat or hearing loss Resp: no cough, wheezing, or hemoptysis GU: no dysuria, frequency, or hematuria  Neuro: no headache, numbness, tingling, or weakness of extremities  Heme: no bleeding, DVT, or easy bruising Endo: no polydipsia or polyuria, other systems are negative.    Physical Exam: Blood pressure 111/35, pulse 74, temperature 98 F (36.7 C), temperature source Oral, resp. rate 18, height _0  (1.702 m), weight 91.4 kg (201 lb 8 oz), SpO2 100.00%.   General  appearance: alert, cooperative, appears stated age, no distress and mildly obese Lungs: Bilateral basal coarse crackles, left worse than the right. Chest wall: no tenderness Cardiac exam: S1 and S2 is normal, there is no gallop or murmur appreciated. No rub. Abdomen soft, nontender, no organomegaly. No significant ascites evident by physical exam. Neck: No JVD. No hepatojugular reflux. No thyromegaly. Extremities: 4+ edema with oozing in the left leg at the vein harvest site. No obvious signs of infection.  Labs:   Lab Results  Component Value Date   WBC 8.0 12/24/2013   HGB 9.7* 12/24/2013   HCT 29.7* 12/24/2013   MCV 86.6 12/24/2013   PLT 234 12/24/2013    Recent Labs Lab 12/24/13 0335  NA 128*  K 4.7  CL 89*  CO2 25  BUN 36*  CREATININE 2.49*  CALCIUM 8.5  PROT 6.0  BILITOT 0.4  ALKPHOS 102  ALT 14  AST 13  GLUCOSE 89   Lab Results  Component Value Date   CKTOTAL 311* 06/27/2013   CKMB 11.6* 06/27/2013   TROPONINI 0.66* 11/06/2013    Lipid Panel     Component Value Date/Time   CHOL 125 12/03/2012 0355   TRIG 42 12/03/2012 0355   HDL 72 12/03/2012 0355   CHOLHDL 1.7 12/03/2012 0355   VLDL 8 12/03/2012 0355   LDLCALC 45 12/03/2012 0355    EKG: Normal sinus rhythm, normal axis, poor R-wave progression. Inferior and lateral ST segment depression with T wave inversion, cannot exclude inferior and lateral ischemia. Compared to prior EKG no significant change.  BNP (last 3 results)  Recent Labs  06/26/13 0042 06/26/13 0539 12/23/13 1843  PROBNP 4422.0* 5499.0* 11963.0*      Radiology: Dg Chest 2 View  12/23/2013   CLINICAL DATA:  Short of breath. Legs and ankle swelling. CHF. History of hypertension and coronary artery disease.  EXAM: CHEST  2 VIEW  COMPARISON:  12/10/2013.  FINDINGS: Stable changes of cardiac surgery and mitral valve  replacement. Left coronary artery stents, stable. Cardiac silhouette is normal in size and configuration. No convincing  mediastinal or hilar masses.  There is linear and coarse reticular opacity in the left lower lobe with some hazy type opacity that projects in the left mid lung. This is similar to the prior study. Small bilateral effusions are noted, also present previous. No convincing pulmonary edema. No pneumothorax.  IMPRESSION: 1. No convincing acute cardiopulmonary disease. 2. Left lower lobe opacity is likely chronic atelectasis/scarring related to prior cardiac surgery. No convincing infiltrate. 3. Small persistent bilateral pleural effusions.   Electronically Signed   By: Lajean Manes M.D.   On: 12/23/2013 20:08   ASSESSMENT AND PLAN:  1. Fluid overload state with 4+ leg edema, bilateral 2. Chronic renal failure, stage IV chronic kidney disease 3. CAD of the neck vessels, status post  CABG with LIMA to LAD and SVG to OM along with replacement of mitral valve with a 25 mm magna mitral Ease bioprosthetic valve on 11/05/2013. Echocardiogram 12/06/2013 revealing preserved LVEF, no obvious vegetation of the mitral valve, no pericardial effusion. 4. Acute on chronic diastolic heart failure 5. Diabetes mellitus type 2 uncontrolled with renal manifestation 6. Hypoalbuminemia 7. Anemia of chronic disease 8. Failure to thrive in an adult  Recommendation: I placed a TED hose to improve fluid mobilization. She has third space significant amount of fluids. We'll increase Lasix to 3 times a day dosing and if she responds will monitor the renal function. Lower extremity venous duplex grossly was normal today without evidence of DVT. Final report pending. I will continue to follow the patient along with you if there is any specific cardiac recommendations. I may want to try Spiralactone if her serum creatinine remained stable to mobilize fluid.   Laverda Page, MD 12/24/2013, 11:22 AM [facility]. [place] Pager: [phone] Office: [phone] If no answer Cell [phone]

## 2013-12-24 NOTE — Progress Notes (Signed)
Bilateral lower extremity venous duplex completed:  No obvious evidence of DVT, superficial thrombosis, or Baker's cyst.  Technically difficult study due to the patient's body habitus.   

## 2013-12-24 NOTE — Progress Notes (Signed)
ANTICOAGULATION CONSULT NOTE - Initial Consult  Pharmacy Consult for Coumadin Indication: atrial fibrillation  Allergies  Allergen Reactions  . Aspirin Other (See Comments)    High doses caused stomach bleeds    Patient Measurements: Weight: 201 lb 8 oz (91.4 kg)  Vital Signs: Temp: 98.7 F (37.1 C) (10/20 0108) Temp Source: Oral (10/20 0108) BP: 120/50 mmHg (10/20 0108) Pulse Rate: 71 (10/20 0108)  Labs:  Recent Labs  12/23/13 1843  HGB 10.4*  HCT 31.8*  PLT 226  LABPROT 17.0*  INR 1.37  CREATININE 2.51*    The CrCl is unknown because both a height and weight (above a minimum accepted value) are required for this calculation.   Medical History: Past Medical History  Diagnosis Date  . GERD (gastroesophageal reflux disease)   . PUD (peptic ulcer disease)   . Anemia   . Hypertension   . S/P endoscopy Aug 2011    3 superficial gastric ulcers, NSAID-induced  . S/P colonoscopy Sept 2011    left-sided diverticula, tubular adenoma  . Coronary artery disease   . Shortness of breath   . Diabetes mellitus     insulin dependent  . Headache(784.0)     rare migraines  . Cancer     hx of skin cancer  . Arthritis   . Osteopenia   . Hypercholesterolemia   . SIADH (syndrome of inappropriate ADH production)   . CHF (congestive heart failure)   . Myocardial infarction 2013    Medications:  Prescriptions prior to admission  Medication Sig Dispense Refill  . acetaminophen (TYLENOL) 500 MG tablet Take 1,000 mg by mouth daily as needed (pain).      Marland Kitchen acetaminophen (TYLENOL) 500 MG tablet Take 1,000 mg by mouth every 6 (six) hours as needed.      . B-D ULTRAFINE III SHORT PEN 31G X 8 MM MISC USE AS DIRECTED WITH LANTUS PEN  100 each  5  . Blood Glucose Monitoring Suppl (ACCU-CHEK NANO SMARTVIEW) W/DEVICE KIT Pt needs monitor, strips (box of 400/ 4 refill), lancets (box of 400/4 refills) She checks her BS qid (before meals & QHS) Dx: 250.00  1 kit  0  . cefTRIAXone  (ROCEPHIN) 1 G injection Inject 1 g into the muscle once. Started on 12-22-13 for 7 days      . citalopram (CELEXA) 20 MG tablet Take 1 tablet (20 mg total) by mouth daily.      . feeding supplement, GLUCERNA SHAKE, (GLUCERNA SHAKE) LIQD Take 237 mLs by mouth 3 (three) times daily with meals.    0  . furosemide (LASIX) 80 MG tablet Take 80 mg by mouth daily.      . insulin aspart (NOVOLOG FLEXPEN) 100 UNIT/ML FlexPen Inject 5-10 Units into the skin 3 (three) times daily with meals. 70-120=0    251-300=5 121-150=1  301-350=7 151-200=2  351-400=9 201-250=3  400 or >, call MD  units of insulin  15 mL  11  . insulin glargine (LANTUS) 100 UNIT/ML injection Inject 0.2 mLs (20 Units total) into the skin 2 (two) times daily.  10 mL  11  . Melatonin 3 MG TABS Take 3 mg by mouth at bedtime as needed (insomnia).      . metoprolol tartrate (LOPRESSOR) 12.5 mg TABS tablet Take 0.5 tablets (12.5 mg total) by mouth 2 (two) times daily.      Marland Kitchen nystatin cream (MYCOSTATIN) Apply topically 3 (three) times daily.  30 g  0  . pantoprazole (PROTONIX) 40 MG  tablet Take 1 tablet (40 mg total) by mouth daily.  60 tablet  3  . simvastatin (ZOCOR) 20 MG tablet Take 1 tablet (20 mg total) by mouth every evening.  90 tablet  1  . warfarin (COUMADIN) 2 MG tablet Take 2 mg by mouth daily. Only from 12-20-08-12-22-13 per nursing home mar.  then patient was going up to 3 mg        Assessment: 80 yo female admitted with CHF, h/o Afib, for Coumadin, last dose 10/19  Goal of Therapy:  INR 2-3 Monitor platelets by anticoagulation protocol: Yes   Plan:  Daily INR  Hollis Tuller, Bronson Curb 12/24/2013,1:31 AM

## 2013-12-24 NOTE — Progress Notes (Signed)
80 year old with H/o CAD, s/p PCI to circumflex coronary artery , recently underwent CABG was discharged to rehab comes back with worsening pedal edema and hyponatremia. She was admitted to medical service for diuresis and for volume overload. She was started on IV lasix and cardiology consulted.   Continue to monitor.   Hosie Poisson, MD [phone]

## 2013-12-25 DIAGNOSIS — E871 Hypo-osmolality and hyponatremia: Secondary | ICD-10-CM

## 2013-12-25 DIAGNOSIS — E8770 Fluid overload, unspecified: Secondary | ICD-10-CM

## 2013-12-25 LAB — URINALYSIS, ROUTINE W REFLEX MICROSCOPIC
Bilirubin Urine: NEGATIVE
Glucose, UA: 100 mg/dL — AB
Hgb urine dipstick: NEGATIVE
Ketones, ur: NEGATIVE mg/dL
NITRITE: NEGATIVE
PH: 6.5 (ref 5.0–8.0)
Protein, ur: NEGATIVE mg/dL
Specific Gravity, Urine: 1.007 (ref 1.005–1.030)
UROBILINOGEN UA: 0.2 mg/dL (ref 0.0–1.0)

## 2013-12-25 LAB — GLUCOSE, CAPILLARY
GLUCOSE-CAPILLARY: 121 mg/dL — AB (ref 70–99)
Glucose-Capillary: 67 mg/dL — ABNORMAL LOW (ref 70–99)
Glucose-Capillary: 178 mg/dL — ABNORMAL HIGH (ref 70–99)
Glucose-Capillary: 157 mg/dL — ABNORMAL HIGH (ref 70–99)
Glucose-Capillary: 161 mg/dL — ABNORMAL HIGH (ref 70–99)

## 2013-12-25 LAB — BASIC METABOLIC PANEL
ANION GAP: 10 (ref 5–15)
BUN: 36 mg/dL — AB (ref 6–23)
CHLORIDE: 90 meq/L — AB (ref 96–112)
CO2: 29 meq/L (ref 19–32)
CREATININE: 2.46 mg/dL — AB (ref 0.50–1.10)
Calcium: 8.6 mg/dL (ref 8.4–10.5)
GFR calc Af Amer: 20 mL/min — ABNORMAL LOW (ref >90)
GFR calc non Af Amer: 18 mL/min — ABNORMAL LOW (ref >90)
GLUCOSE: 70 mg/dL (ref 70–99)
Potassium: 4.7 mEq/L (ref 3.7–5.3)
Sodium: 129 mEq/L — ABNORMAL LOW (ref 137–147)

## 2013-12-25 LAB — PROTIME-INR
INR: 1.26 (ref 0.00–1.49)
Prothrombin Time: 16 seconds — ABNORMAL HIGH (ref 11.6–15.2)

## 2013-12-25 LAB — URINE MICROSCOPIC-ADD ON

## 2013-12-25 LAB — PRO B NATRIURETIC PEPTIDE: PRO B NATRI PEPTIDE: 14909 pg/mL — AB (ref 0–450)

## 2013-12-25 MED ORDER — METOPROLOL TARTRATE 25 MG PO TABS
25.0000 mg | ORAL_TABLET | Freq: Two times a day (BID) | ORAL | Status: DC
  Administered 2013-12-25 – 2013-12-31 (×12): 25 mg via ORAL
  Filled 2013-12-25 (×13): qty 1

## 2013-12-25 MED ORDER — FLUTICASONE PROPIONATE 50 MCG/ACT NA SUSP
1.0000 | Freq: Every day | NASAL | Status: DC
  Administered 2013-12-25 – 2013-12-29 (×3): 1 via NASAL
  Filled 2013-12-25: qty 16

## 2013-12-25 MED ORDER — WARFARIN SODIUM 4 MG PO TABS
4.0000 mg | ORAL_TABLET | Freq: Once | ORAL | Status: AC
  Administered 2013-12-25: 4 mg via ORAL
  Filled 2013-12-25: qty 1

## 2013-12-25 NOTE — Progress Notes (Signed)
Right upper chest old Central Line site cleaned per orders and AquaGel applied as directed. LLE harvest site cleaned per orders Aquacel packed into wound as directed. Site covered with ABD pad and Kerlix. Patient tolerated procedure well. Nurse will continue to monitor. Janan Halter, SN, BSON

## 2013-12-25 NOTE — Care Management Note (Signed)
    Page 1 of 1   12/31/2013     4:52:38 PM CARE MANAGEMENT NOTE 12/31/2013  Patient:  Claudia [name], Claudia [name]   Account Number:  [id number]  Date Initiated:  12/25/2013  Documentation initiated by:  Linkon Siverson  Subjective/Objective Assessment:   Pt adm on 12/23/13 with volume overload s/p CABG/MVR.  PTA, pt resided at [facility] in [place].     Action/Plan:   CSW consulted to facilitate dc to SNF when medically stable.  Will follow progress.   Anticipated DC Date:  12/30/2013   Anticipated DC Plan:  SKILLED NURSING FACILITY  In-house referral  Clinical Social Worker      DC Planning Services  CM consult      Choice offered to / List presented to:             Status of service:  Completed, signed off Medicare Important Message given?  YES (If response is "NO", the following Medicare IM given date fields will be blank) Date Medicare IM given:  12/26/2013 Medicare IM given by:  Derrian Poli Date Additional Medicare IM given:  12/30/2013 Additional Medicare IM given by:  HENRIETTA MAYO  Discharge Disposition:  [place]  Per UR Regulation:  Reviewed for med. necessity/level of care/duration of stay  If discussed at [place] of Stay Meetings, dates discussed:   12/31/2013    Comments:  12/31/13 Ellan Lambert, RN, BSN [phone] pt discharged to SNF today, per CSW arrangements.

## 2013-12-25 NOTE — Progress Notes (Addendum)
Subjective:  Patient feels much better, leg edema is improving.  Has been evaluated by wound care, no other specific complaints today.  Objective:  Vital Signs in the last 24 hours: Temp:  [98.5 F (36.9 C)-98.8 F (37.1 C)] 98.8 F (37.1 C) (10/21 1529) Pulse Rate:  [73-79] 75 (10/21 1529) Resp:  [16-19] 19 (10/21 1529) BP: (122-138)/(41-59) 135/54 mmHg (10/21 1529) SpO2:  [97 %-100 %] 100 % (10/21 1529) Weight:  [89.812 kg (198 lb)] 89.812 kg (198 lb) (10/21 0410)  Intake/Output from previous day: 10/20 0701 - 10/21 0700 In: 360 [P.O.:360] Out: 3000 [Urine:3000]  Physical Exam: General appearance: alert, cooperative, appears stated age, no distress and mildly obese  Lungs: Bilateral basal coarse crackles, left worse than the right.  Chest wall: no tenderness, right chest infraclavicular area has a small open wound. Cardiac exam: S1 and S2 is normal, there is no gallop or murmur appreciated. No rub.  Abdomen soft, nontender, no organomegaly. No significant ascites evident by physical exam.  Neck: No JVD. No hepatojugular reflux. No thyromegaly.  Extremities: 4+ edema with oozing in the left leg at the vein harvest site. No obvious signs of infection.  Lab Results: BMP  Recent Labs  12/23/13 1843 12/24/13 0335 12/25/13 0319  NA 125* 128* 129*  K 5.0 4.7 4.7  CL 86* 89* 90*  CO2 27 25 29   GLUCOSE 183* 89 70  BUN 36* 36* 36*  CREATININE 2.51* 2.49* 2.46*  CALCIUM 8.7 8.5 8.6  GFRNONAA 17* 17* 18*  GFRAA 20* 20* 20*    CBC  Recent Labs Lab 12/24/13 0335  WBC 8.0  RBC 3.43*  HGB 9.7*  HCT 29.7*  PLT 234  MCV 86.6  MCH 28.3  MCHC 32.7  RDW 15.6*  LYMPHSABS 0.6*  MONOABS 0.7  EOSABS 0.1  BASOSABS 0.0    HEMOGLOBIN A1C Lab Results  Component Value Date   HGBA1C 7.2* 11/06/2013   MPG 160* 11/06/2013    Cardiac Panel (last 3 results)  Recent Labs  06/26/13 0539 06/26/13 1520 06/27/13 0523 11/06/13 1125  CKTOTAL 318* 561* 311*  --   CKMB 24.8*  37.1* 11.6*  --   TROPONINI 2.91* 14.02* 7.84* 0.66*  RELINDX 7.8* 6.6* 3.7*  --     BNP (last 3 results)  Recent Labs  06/26/13 0539 12/23/13 1843 12/25/13 0319  PROBNP 5499.0* 11963.0* 14909.0*    TSH  Recent Labs  12/24/13 0335  TSH 1.610    CHOLESTEROL No results found for this basename: CHOL,  in the last 8760 hours  Hepatic Function Panel  Recent Labs  11/09/13 0345  12/06/13 0909 12/23/13 1843 12/24/13 0335  PROT 4.5*  < > 6.4 6.6 6.0  ALBUMIN 3.1*  < > 2.8* 2.7* 2.6*  AST 78*  < > 15 14 13   ALT 19  < > 11 16 14   ALKPHOS 31*  < > 77 103 102  BILITOT 1.6*  < > 0.5 0.5 0.4  BILIDIR 0.4*  --   --   --   --   IBILI 1.2*  --   --   --   --   < > = values in this interval not displayed.  Scheduled Meds: . citalopram  20 mg Oral Daily  . feeding supplement (GLUCERNA SHAKE)  237 mL Oral TID WC  . fluticasone  1 spray Each Nare Daily  . furosemide  80 mg Intravenous Q6H  . insulin aspart  0-9 Units Subcutaneous TID WC  .  insulin glargine  20 Units Subcutaneous BID  . metoprolol tartrate  12.5 mg Oral BID  . nystatin cream   Topical TID  . pantoprazole  40 mg Oral Daily  . simvastatin  20 mg Oral QPM  . sodium chloride  3 mL Intravenous Q12H  . sodium chloride  3 mL Intravenous Q12H  . Warfarin - Pharmacist Dosing Inpatient   Does not apply q1800   Continuous Infusions:  PRN Meds:.acetaminophen, acetaminophen, ondansetron (ZOFRAN) IV, ondansetron  Assessment/Plan:  1. Fluid overload state with 4+ leg edema, bilateral, improving. About 1.5 KG weight loss and 3.5 liters negative fluid balance. 2. Chronic renal failure, stage III chronic kidney disease  3. CAD of the neck vessels, status post CABG with LIMA to LAD and SVG to OM along with replacement of mitral valve with a 25 mm magna mitral Ease bioprosthetic valve on 11/05/2013. Echocardiogram 12/06/2013 revealing preserved LVEF, no obvious vegetation of the mitral valve, no pericardial effusion.  4. Acute  on chronic diastolic heart failure  5. Diabetes mellitus type 2 uncontrolled with renal manifestation  6. Hypoalbuminemia  7. Anemia of chronic disease  8. Failure to thrive in an adult  Recommendation: Patient is presently responding well to IV furosemide, continue the same.  She has no evidence of DVT.  I'll continue to follow the patient along with sidelines.  No clinical evidence of endocarditis. I may consider reducing the dose of furosemide tomorrow depending on her clinical status. I will increase her metoprolol to 25 mg by mouth twice a day.    Laverda Page, M.D. 12/25/2013, 5:39 PM [facility] Pager: [phone] Office: [phone] If no answer: [phone]    Changed Lasix to BID for now. Continue to monitor  BMP and hopefully ready for discharge in 24 to 48 hours. PT consult

## 2013-12-25 NOTE — Consult Note (Signed)
WOC wound consult note Reason for Consult: evaluation chest wound and LLE wound.  Pt s/p CABG 11/08/13.  Site right upper chest is the previous site of her central line that has apparently not healed and she has new onset LE edema that has caused her incision on the LLE to begin to weep.  Wound type: Surgical sites right upper chest and LLE Pressure Ulcer POA:/No Measurement: R upper chest: 0.5cm x 0.2cm x 0.1cm  LLE: clean, approximated; healed except for one area on the most distal end of the incision that upon probing with the sterile applicator is open and draining.  Wound bed: R chest: clean, pink, moist LLE: not able to visualize a wound bed. Drainage (amount, consistency, odor) heavy, serous drainage from the LLE, no odor Minimal drainage from the right chest wound  Periwound: all intact and no erythema  Dressing procedure/placement/frequency: Hydrogel for the right upper chest wound, this will maintain moisture and encourage closure Silver hydrofiber to wick the drainage away from the site, cover with ABD pads. Change both dressings daily.  Follow up with CVTS as scheduled Rhyli Depaula Liane Comber RN,CWOCN [phone]

## 2013-12-25 NOTE — Progress Notes (Signed)
ANTICOAGULATION CONSULT NOTE - Initial Consult  Pharmacy Consult for Coumadin Indication: atrial fibrillation  Allergies  Allergen Reactions  . Aspirin Other (See Comments)    High doses caused stomach bleeds    Patient Measurements: Height: 5\' 7"  (170.2 cm) Weight: 198 lb (89.812 kg) IBW/kg (Calculated) : 61.6  Vital Signs: Temp: 98.6 F (37 C) (10/21 0410) Temp Source: Oral (10/21 0410) BP: 138/50 mmHg (10/21 1041) Pulse Rate: 79 (10/21 1041)  Labs:  Recent Labs  12/23/13 1843 12/24/13 0335 12/25/13 0319  HGB 10.4* 9.7*  --   HCT 31.8* 29.7*  --   PLT 226 234  --   LABPROT 17.0* 17.4* 16.0*  INR 1.37 1.41 1.26  CREATININE 2.51* 2.49* 2.46*    Estimated Creatinine Clearance: 21.3 ml/min (by C-G formula based on Cr of 2.46).   Medical History: Past Medical History  Diagnosis Date  . GERD (gastroesophageal reflux disease)   . PUD (peptic ulcer disease)   . Anemia   . Hypertension   . S/P endoscopy Aug 2011    3 superficial gastric ulcers, NSAID-induced  . S/P colonoscopy Sept 2011    left-sided diverticula, tubular adenoma  . Coronary artery disease   . Shortness of breath   . Diabetes mellitus     insulin dependent  . Headache(784.0)     rare migraines  . Cancer     hx of skin cancer  . Arthritis   . Osteopenia   . Hypercholesterolemia   . SIADH (syndrome of inappropriate ADH production)   . CHF (congestive heart failure)   . Myocardial infarction 2013    Assessment: 80 yo female admitted with CHF exacerbation, h/o Afib, continue on Coumadin, INR 1.26. CBC stable  PTA dose: 3 mg daily per nursing home MAR  Goal of Therapy:  INR 2-3 Monitor platelets by anticoagulation protocol: Yes   Plan:  Coumadin 4 mg po x 1 today Daily INR Monitor CBC, s/sx of bleeding.     Hughes Better, PharmD, BCPS Clinical Pharmacist Pager: [phone] 12/25/2013 11:47 AM

## 2013-12-25 NOTE — Progress Notes (Signed)
PROGRESS NOTE  Claudia [name] HYW:[id number] DOB: 08/14/1931 DOA: 12/23/2013 PCP: Odette Fraction, MD  Assessment/Plan: Fluid overload -  -Lasix 80 mg IV q 6 hourly.  -intake output and metabolic pain on daily weights. -2-D echo done was 2 weeks ago which showed EF of 50-55%.  -Dopplers of the lower extremity no DVT  CAD status post CABG and mitral valve replacement with tissue valve recently last month   Atrial fibrillation presently in sinus rhythm - continue Coumadin per pharmacy.  Diabetes mellitus type 2 - on insulin. Closely follow CBGs.  Chronic kidney disease stage 3-4 - see #1.  Chronic anemia - follow CBC.  Hyperlipidemia - continue statins.  Generalized weakness probably from deconditioning -  TSH ok  Serratia Marcesans Bacteremia during recent admission and had 15 days of antibiotic therapy. Patient as per nursing home [facility] is on ceftriaxone probably for UTI started on October 18 th. I have discussed with pharmacist and at this time we will hold ceftriaxone  Hyponatremia -improved  Code Status: full Family Communication: patient Disposition Plan:    Consultants:    Procedures:      HPI/Subjective: Swelling in legs better -  Objective: Filed Vitals:   12/25/13 0841  BP: 132/52  Pulse: 75  Temp:   Resp: 16    Intake/Output Summary (Last 24 hours) at 12/25/13 1035 Last data filed at 12/25/13 0830  Gross per 24 hour  Intake    480 ml  Output   4250 ml  Net  -3770 ml   Filed Weights   12/24/13 0108 12/25/13 0410  Weight: 91.4 kg (201 lb 8 oz) 89.812 kg (198 lb)    Exam:   General:  A+Ox3, NAD  Cardiovascular: rrr  Respiratory: clear, no wheezing  Abdomen: +BS, soft, obese  Musculoskeletal: +edema   Data Reviewed: Basic Metabolic Panel:  Recent Labs Lab 12/23/13 1843 12/24/13 0335 12/25/13 0319  NA 125* 128* 129*  K 5.0 4.7 4.7  CL 86* 89* 90*  CO2 27 25 29   GLUCOSE 183* 89 70  BUN 36* 36* 36*  CREATININE 2.51*  2.49* 2.46*  CALCIUM 8.7 8.5 8.6   Liver Function Tests:  Recent Labs Lab 12/23/13 1843 12/24/13 0335  AST 14 13  ALT 16 14  ALKPHOS 103 102  BILITOT 0.5 0.4  PROT 6.6 6.0  ALBUMIN 2.7* 2.6*   No results found for this basename: LIPASE, AMYLASE,  in the last 168 hours No results found for this basename: AMMONIA,  in the last 168 hours CBC:  Recent Labs Lab 12/23/13 1843 12/24/13 0335  WBC 10.4 8.0  NEUTROABS  --  6.6  HGB 10.4* 9.7*  HCT 31.8* 29.7*  MCV 83.0 86.6  PLT 226 234   Cardiac Enzymes: No results found for this basename: CKTOTAL, CKMB, CKMBINDEX, TROPONINI,  in the last 168 hours BNP (last 3 results)  Recent Labs  06/26/13 0539 12/23/13 1843 12/25/13 0319  PROBNP 5499.0* 11963.0* 14909.0*   CBG:  Recent Labs Lab 12/24/13 1214 12/24/13 1644 12/24/13 2133 12/25/13 0604 12/25/13 0719  GLUCAP 171* 145* 121* 67* 121*    No results found for this or any previous visit (from the past 240 hour(s)).   Studies: Dg Chest 2 View  12/23/2013   CLINICAL DATA:  Short of breath. Legs and ankle swelling. CHF. History of hypertension and coronary artery disease.  EXAM: CHEST  2 VIEW  COMPARISON:  12/10/2013.  FINDINGS: Stable changes of cardiac surgery and mitral valve replacement. Left coronary  artery stents, stable. Cardiac silhouette is normal in size and configuration. No convincing mediastinal or hilar masses.  There is linear and coarse reticular opacity in the left lower lobe with some hazy type opacity that projects in the left mid lung. This is similar to the prior study. Small bilateral effusions are noted, also present previous. No convincing pulmonary edema. No pneumothorax.  IMPRESSION: 1. No convincing acute cardiopulmonary disease. 2. Left lower lobe opacity is likely chronic atelectasis/scarring related to prior cardiac surgery. No convincing infiltrate. 3. Small persistent bilateral pleural effusions.   Electronically Signed   By: Lajean Manes M.D.    On: 12/23/2013 20:08    Scheduled Meds: . citalopram  20 mg Oral Daily  . feeding supplement (GLUCERNA SHAKE)  237 mL Oral TID WC  . furosemide  80 mg Intravenous Q6H  . insulin aspart  0-9 Units Subcutaneous TID WC  . insulin glargine  20 Units Subcutaneous BID  . metoprolol tartrate  12.5 mg Oral BID  . nystatin cream   Topical TID  . pantoprazole  40 mg Oral Daily  . simvastatin  20 mg Oral QPM  . sodium chloride  3 mL Intravenous Q12H  . sodium chloride  3 mL Intravenous Q12H  . Warfarin - Pharmacist Dosing Inpatient   Does not apply q1800   Continuous Infusions:  Antibiotics Given (last 72 hours)   None      Principal Problem:   Volume overload Active Problems:   DM (diabetes mellitus), type 2   Hypercholesterolemia   CHF (congestive heart failure)   S/P CABG x 2   S/P mitral valve replacement with bioprosthetic valve   CKD (chronic kidney disease) stage 4, GFR 15-29 ml/min   Fluid overload    Time spent: [place] [place] [facility] Pager [phone]/21/2015, 10:35 AM  LOS: 2 days

## 2013-12-25 NOTE — Progress Notes (Signed)
12/25/13 1500  Clinical Encounter Type  Visited With Patient;Health care provider  Visit Type Follow-up  Referral From Family;Nurse  Stress Factors  Patient Stress Factors Family relationships;Major life changes   Chaplain was referred to patient via spiritual care consult. Patient was interested in completing her Advanced Directive. Patient's son helped her complete the document and the patient consented that she understood what she had filled out. Chaplain assisted patient in completing her Advanced Directive. Chaplain placed a copy of the patient's Advanced Directive in her chart. Patient also talked to the chaplain about how she has been feeling sick a lot lately. Patient expressed relief at the completion of her Advanced Directive and hopes that it will prevent any potential feuds in her family. Patient's son is strongly supportive. Patient expressed high hopes in being able to return home after she completes rehab at her nursing home facility. Chaplain will continue to provide emotional and spiritual support for patient as needed. Breionna Punt, Claudius Sis, Chaplain 3:24 PM

## 2013-12-26 ENCOUNTER — Encounter: Payer: Self-pay | Admitting: Neurology

## 2013-12-26 LAB — CBC
HCT: 31.3 % — ABNORMAL LOW (ref 36.0–46.0)
HEMOGLOBIN: 10.3 g/dL — AB (ref 12.0–15.0)
MCH: 27.1 pg (ref 26.0–34.0)
MCHC: 32.9 g/dL (ref 30.0–36.0)
MCV: 82.4 fL (ref 78.0–100.0)
Platelets: 250 10*3/uL (ref 150–400)
RBC: 3.8 MIL/uL — AB (ref 3.87–5.11)
RDW: 15.5 % (ref 11.5–15.5)
WBC: 16.8 10*3/uL — ABNORMAL HIGH (ref 4.0–10.5)

## 2013-12-26 LAB — BASIC METABOLIC PANEL
Anion gap: 13 (ref 5–15)
BUN: 32 mg/dL — ABNORMAL HIGH (ref 6–23)
CALCIUM: 8.5 mg/dL (ref 8.4–10.5)
CO2: 26 mEq/L (ref 19–32)
Chloride: 88 mEq/L — ABNORMAL LOW (ref 96–112)
Creatinine, Ser: 2.22 mg/dL — ABNORMAL HIGH (ref 0.50–1.10)
GFR calc non Af Amer: 20 mL/min — ABNORMAL LOW (ref >90)
GFR, EST AFRICAN AMERICAN: 23 mL/min — AB (ref >90)
Glucose, Bld: 114 mg/dL — ABNORMAL HIGH (ref 70–99)
POTASSIUM: 4.1 meq/L (ref 3.7–5.3)
Sodium: 127 mEq/L — ABNORMAL LOW (ref 137–147)

## 2013-12-26 LAB — PROTIME-INR
INR: 1.42 (ref 0.00–1.49)
PROTHROMBIN TIME: 17.5 s — AB (ref 11.6–15.2)

## 2013-12-26 LAB — GLUCOSE, CAPILLARY
Glucose-Capillary: 132 mg/dL — ABNORMAL HIGH (ref 70–99)
Glucose-Capillary: 91 mg/dL (ref 70–99)
Glucose-Capillary: 78 mg/dL (ref 70–99)
Glucose-Capillary: 82 mg/dL (ref 70–99)

## 2013-12-26 LAB — PRO B NATRIURETIC PEPTIDE: PRO B NATRI PEPTIDE: 21119 pg/mL — AB (ref 0–450)

## 2013-12-26 LAB — CLOSTRIDIUM DIFFICILE BY PCR: Toxigenic C. Difficile by PCR: POSITIVE — AB

## 2013-12-26 MED ORDER — WARFARIN SODIUM 4 MG PO TABS
4.0000 mg | ORAL_TABLET | Freq: Once | ORAL | Status: AC
  Administered 2013-12-26: 4 mg via ORAL
  Filled 2013-12-26: qty 1

## 2013-12-26 MED ORDER — FUROSEMIDE 10 MG/ML IJ SOLN
80.0000 mg | Freq: Two times a day (BID) | INTRAMUSCULAR | Status: DC
Start: 2013-12-26 — End: 2013-12-27
  Administered 2013-12-26 – 2013-12-27 (×2): 80 mg via INTRAVENOUS
  Filled 2013-12-26 (×2): qty 8

## 2013-12-26 MED ORDER — METRONIDAZOLE 500 MG PO TABS
500.0000 mg | ORAL_TABLET | Freq: Three times a day (TID) | ORAL | Status: DC
  Administered 2013-12-26 – 2013-12-31 (×16): 500 mg via ORAL
  Filled 2013-12-26 (×18): qty 1

## 2013-12-26 MED ORDER — NYSTATIN 100000 UNIT/GM EX CREA
TOPICAL_CREAM | Freq: Three times a day (TID) | CUTANEOUS | Status: DC
  Administered 2013-12-27 – 2013-12-30 (×10): via TOPICAL
  Filled 2013-12-26: qty 15

## 2013-12-26 NOTE — Progress Notes (Signed)
PROGRESS NOTE  MARTHA ELLERBY UYQ:[id number] DOB: Apr 28, 1931 DOA: 12/23/2013 PCP: Odette Fraction, MD  Assessment/Plan: Fluid overload -  -Lasix 80 mg IV BID per cards.  -intake output and metabolic pain on daily weights. -2-D echo done was 2 weeks ago which showed EF of 50-55%.  -Dopplers of the lower extremity no DVT  CAD status post CABG and mitral valve replacement with tissue valve recently last month   Atrial fibrillation presently in sinus rhythm - continue Coumadin per pharmacy.  Diabetes mellitus type 2 - on insulin. Closely follow CBGs.  Chronic kidney disease stage 3-4 - see #1.  Chronic anemia - follow CBC.  Hyperlipidemia - continue statins.  Generalized weakness probably from deconditioning -  TSH ok  Serratia Marcesans Bacteremia during recent admission and had 15 days of antibiotic therapy. Patient as per nursing home [facility] is on ceftriaxone probably for UTI started on October 18 th. I have discussed with pharmacist and at this time we will hold ceftriaxone  Hyponatremia -fluid restrict  Leukocytosis but no fever- monitor   Code Status: full Family Communication: patient Disposition Plan: SNF in AM???   Consultants:    Procedures:      HPI/Subjective: Swelling in legs better Some nausea in aM  Objective: Filed Vitals:   12/26/13 0418  BP: 128/46  Pulse: 77  Temp: 98.1 F (36.7 C)  Resp: 18    Intake/Output Summary (Last 24 hours) at 12/26/13 1015 Last data filed at 12/26/13 0700  Gross per 24 hour  Intake    582 ml  Output   1830 ml  Net  -1248 ml   Filed Weights   12/24/13 0108 12/25/13 0410 12/26/13 0300  Weight: 91.4 kg (201 lb 8 oz) 89.812 kg (198 lb) 87.635 kg (193 lb 3.2 oz)    Exam:   General:  A+Ox3, NAD  Cardiovascular: rrr  Respiratory: clear, no wheezing  Abdomen: +BS, soft, obese  Musculoskeletal: +edema   Data Reviewed: Basic Metabolic Panel:  Recent Labs Lab 12/23/13 1843 12/24/13 0335  12/25/13 0319 12/26/13 0504  NA 125* 128* 129* 127*  K 5.0 4.7 4.7 4.1  CL 86* 89* 90* 88*  CO2 27 25 29 26   GLUCOSE 183* 89 70 114*  BUN 36* 36* 36* 32*  CREATININE 2.51* 2.49* 2.46* 2.22*  CALCIUM 8.7 8.5 8.6 8.5   Liver Function Tests:  Recent Labs Lab 12/23/13 1843 12/24/13 0335  AST 14 13  ALT 16 14  ALKPHOS 103 102  BILITOT 0.5 0.4  PROT 6.6 6.0  ALBUMIN 2.7* 2.6*   No results found for this basename: LIPASE, AMYLASE,  in the last 168 hours No results found for this basename: AMMONIA,  in the last 168 hours CBC:  Recent Labs Lab 12/23/13 1843 12/24/13 0335 12/26/13 0504  WBC 10.4 8.0 16.8*  NEUTROABS  --  6.6  --   HGB 10.4* 9.7* 10.3*  HCT 31.8* 29.7* 31.3*  MCV 83.0 86.6 82.4  PLT 226 234 250   Cardiac Enzymes: No results found for this basename: CKTOTAL, CKMB, CKMBINDEX, TROPONINI,  in the last 168 hours BNP (last 3 results)  Recent Labs  12/23/13 1843 12/25/13 0319 12/26/13 0504  PROBNP 11963.0* 14909.0* 21119.0*   CBG:  Recent Labs Lab 12/25/13 0719 12/25/13 1137 12/25/13 1651 12/25/13 2110 12/26/13 0654  GLUCAP 121* 178* 157* 161* 132*    No results found for this or any previous visit (from the past 240 hour(s)).   Studies: No results found.  Scheduled  Meds: . citalopram  20 mg Oral Daily  . feeding supplement (GLUCERNA SHAKE)  237 mL Oral TID WC  . fluticasone  1 spray Each Nare Daily  . furosemide  80 mg Intravenous BID  . insulin aspart  0-9 Units Subcutaneous TID WC  . insulin glargine  20 Units Subcutaneous BID  . metoprolol tartrate  25 mg Oral BID  . nystatin cream   Topical TID  . pantoprazole  40 mg Oral Daily  . simvastatin  20 mg Oral QPM  . sodium chloride  3 mL Intravenous Q12H  . sodium chloride  3 mL Intravenous Q12H  . Warfarin - Pharmacist Dosing Inpatient   Does not apply q1800   Continuous Infusions:  Antibiotics Given (last 72 hours)   None      Principal Problem:   Volume overload Active  Problems:   DM (diabetes mellitus), type 2   Hypercholesterolemia   CHF (congestive heart failure)   S/P CABG x 2   S/P mitral valve replacement with bioprosthetic valve   CKD (chronic kidney disease) stage 4, GFR 15-29 ml/min   Fluid overload    Time spent: [place] [place] [facility] Pager [phone]/22/2015, 10:15 AM  LOS: 3 days

## 2013-12-26 NOTE — Progress Notes (Signed)
[facility] for Coumadin Indication: atrial fibrillation  Allergies  Allergen Reactions  . Aspirin Other (See Comments)    High doses caused stomach bleeds    Patient Measurements: Height: 5\' 7"  (170.2 cm) Weight: 193 lb 3.2 oz (87.635 kg) IBW/kg (Calculated) : 61.6  Vital Signs: Temp: 98.2 F (36.8 C) (10/22 1015) Temp Source: Oral (10/22 1015) BP: 121/45 mmHg (10/22 1015) Pulse Rate: 80 (10/22 1015)  Labs:  Recent Labs  12/23/13 1843 12/24/13 0335 12/25/13 0319 12/26/13 0504  HGB 10.4* 9.7*  --  10.3*  HCT 31.8* 29.7*  --  31.3*  PLT 226 234  --  250  LABPROT 17.0* 17.4* 16.0* 17.5*  INR 1.37 1.41 1.26 1.42  CREATININE 2.51* 2.49* 2.46* 2.22*    Estimated Creatinine Clearance: 23.4 ml/min (by C-G formula based on Cr of 2.22).   Medical History: Past Medical History  Diagnosis Date  . GERD (gastroesophageal reflux disease)   . PUD (peptic ulcer disease)   . Anemia   . Hypertension   . S/P endoscopy Aug 2011    3 superficial gastric ulcers, NSAID-induced  . S/P colonoscopy Sept 2011    left-sided diverticula, tubular adenoma  . Coronary artery disease   . Shortness of breath   . Diabetes mellitus     insulin dependent  . Headache(784.0)     rare migraines  . Cancer     hx of skin cancer  . Arthritis   . Osteopenia   . Hypercholesterolemia   . SIADH (syndrome of inappropriate ADH production)   . CHF (congestive heart failure)   . Myocardial infarction 2013    Assessment: 80 yo female admitted with CHF exacerbation, h/o Afib, continue on Coumadin, INR 1.42. CBC stable  PTA dose: 3 mg daily per nursing home MAR  Goal of Therapy:  INR 2-3 Monitor platelets by anticoagulation protocol: Yes   Plan:  Coumadin 4 mg po x 1 today Daily INR Monitor CBC, s/sx of bleeding.    Thanks for allowing pharmacy to be a part of this patient's care.  Excell Seltzer, PharmD Clinical Pharmacist, [phone] 12/26/2013 10:54 AM

## 2013-12-26 NOTE — Evaluation (Signed)
Physical Therapy Evaluation Patient Details Name: Claudia [name] MRN: [id number] DOB: 19-Jan-1932 Today's Date: 12/26/2013   History of Present Illness  Pt admitted with fluid overload and generalized weakness with failure to thrive. Was recently admitted for CABG x2 and MVR on 11/09/2013. Hx of HTN, MI, DM.  Clinical Impression  Pt admitted with the above. Pt currently with functional limitations due to the deficits listed below (see PT Problem List). Willing to work with therapy as able today. Participated in transfer training at [facility] assist level but limited by nausea and frequent urge for bowel movement with nausea. Pt will benefit from skilled PT to increase their independence and safety with mobility to allow discharge to the venue listed below.      Follow Up Recommendations SNF    Equipment Recommendations  None recommended by PT    Recommendations for Other Services       Precautions / Restrictions Precautions Precautions: Fall;Sternal Restrictions Weight Bearing Restrictions: Yes Other Position/Activity Restrictions: sternal precautions      Mobility  Bed Mobility Overal bed mobility: Needs Assistance Bed Mobility: Supine to Sit     Supine to sit: Min assist     General bed mobility comments: Min assist to scoot to edge of bed, leans posteriorly requiring assist intermittently. VC for technique and to maintain sternal precautions  Transfers Overall transfer level: Needs assistance Equipment used: None Transfers: Sit to/from [facility] Sit to Stand: Mod assist Stand pivot transfers: Mod assist       General transfer comment: VC to maintain sternal precautions, Mod assist for boost to stand and pivot transfer. Performed from bed to [facility], and BSC to chair. Pt leans heavily towards posterior despite tactile and verbal cues throughout. No knee buckling however pt rushes to sit before being safely aligned in front of chair.  Ambulation/Gait                 Stairs            Wheelchair Mobility    Modified Rankin (Stroke Patients Only)       Balance Overall balance assessment: Needs assistance Sitting-balance support: No upper extremity supported;Feet supported Sitting balance-Leahy Scale: Fair     Standing balance support: Bilateral upper extremity supported Standing balance-Leahy Scale: Poor                               Pertinent Vitals/Pain Pain Assessment: No/denies pain Pain Intervention(s): Monitored during session    Home Living Family/patient expects to be discharged to:: Skilled nursing facility Living Arrangements: Children Available Help at Discharge: Family;Available 24 hours/day Type of Home: House Home Access: Stairs to enter Entrance Stairs-Rails: Right Entrance Stairs-Number of Steps: 8 Home Layout: One level Home Equipment: Walker - 2 wheels;Cane - single point      Prior Function Level of Independence: Independent with assistive device(s)         Comments: using a rolling walker for the past couple of weeks at SNF.     Hand Dominance   Dominant Hand: Right    Extremity/Trunk Assessment   Upper Extremity Assessment: Defer to OT evaluation           Lower Extremity Assessment: Generalized weakness         Communication   Communication: No difficulties  Cognition Arousal/Alertness: Awake/alert Behavior During Therapy: WFL for tasks assessed/performed Overall Cognitive Status: Within Functional Limits for tasks assessed  General Comments General comments (skin integrity, edema, etc.): Nursing student assist with perineal care while pt standing with PT. Had bowel movement into BSC bucket.    Exercises General Exercises - Lower Extremity Ankle Circles/Pumps: AROM;Both;10 reps;Supine      Assessment/Plan    PT Assessment Patient needs continued PT services  PT Diagnosis Difficulty walking;Generalized weakness   PT  Problem List Decreased strength;Decreased range of motion;Decreased activity tolerance;Decreased balance;Decreased mobility;Decreased knowledge of use of DME;Decreased knowledge of precautions;Pain  PT Treatment Interventions DME instruction;Gait training;Functional mobility training;Therapeutic activities;Therapeutic exercise;Balance training;Neuromuscular re-education;Patient/family education   PT Goals (Current goals can be found in the Care Plan section) Acute Rehab PT Goals Patient Stated Goal: Go back to rehab so I can go home again PT Goal Formulation: With patient Time For Goal Achievement: 01/02/14 Potential to Achieve Goals: Fair    Frequency Min 2X/week   Barriers to discharge        Co-evaluation               End of Session Equipment Utilized During Treatment: Gait belt Activity Tolerance: Patient limited by fatigue;Other (comment) (nausea) Patient left: in chair;with call bell/phone within reach Nurse Communication: Mobility status;Other (comment) (nursing student present during therapy session)         Time: [phone] PT Time Calculation (min): 30 min   Charges:   PT Evaluation $Initial PT Evaluation Tier I: 1 Procedure PT Treatments $Therapeutic Activity: 8-22 mins   PT G Codes:        Elayne Snare, [place]   Ellouise Newer 12/26/2013, 5:02 PM

## 2013-12-27 DIAGNOSIS — I10 Essential (primary) hypertension: Secondary | ICD-10-CM

## 2013-12-27 DIAGNOSIS — A047 Enterocolitis due to Clostridium difficile: Secondary | ICD-10-CM

## 2013-12-27 LAB — BASIC METABOLIC PANEL
ANION GAP: 14 (ref 5–15)
BUN: 32 mg/dL — ABNORMAL HIGH (ref 6–23)
CALCIUM: 8.7 mg/dL (ref 8.4–10.5)
CO2: 27 mEq/L (ref 19–32)
Chloride: 85 mEq/L — ABNORMAL LOW (ref 96–112)
Creatinine, Ser: 2.27 mg/dL — ABNORMAL HIGH (ref 0.50–1.10)
GFR calc non Af Amer: 19 mL/min — ABNORMAL LOW (ref >90)
GFR, EST AFRICAN AMERICAN: 22 mL/min — AB (ref >90)
Glucose, Bld: 38 mg/dL — CL (ref 70–99)
Sodium: 126 mEq/L — ABNORMAL LOW (ref 137–147)

## 2013-12-27 LAB — GLUCOSE, CAPILLARY
GLUCOSE-CAPILLARY: 172 mg/dL — AB (ref 70–99)
Glucose-Capillary: 164 mg/dL — ABNORMAL HIGH (ref 70–99)
Glucose-Capillary: 44 mg/dL — CL (ref 70–99)
Glucose-Capillary: 118 mg/dL — ABNORMAL HIGH (ref 70–99)
Glucose-Capillary: 169 mg/dL — ABNORMAL HIGH (ref 70–99)
Glucose-Capillary: 172 mg/dL — ABNORMAL HIGH (ref 70–99)

## 2013-12-27 LAB — PROTIME-INR
INR: 1.53 — AB (ref 0.00–1.49)
PROTHROMBIN TIME: 18.5 s — AB (ref 11.6–15.2)

## 2013-12-27 LAB — PRO B NATRIURETIC PEPTIDE: Pro B Natriuretic peptide (BNP): 19503 pg/mL — ABNORMAL HIGH (ref 0–450)

## 2013-12-27 MED ORDER — DEXTROSE 50 % IV SOLN: 25.0000 mL | Freq: Once | INTRAVENOUS | Status: AC | PRN

## 2013-12-27 MED ORDER — INSULIN GLARGINE 100 UNIT/ML ~~LOC~~ SOLN: 15.0000 [IU] | Freq: Every day | SUBCUTANEOUS | Status: DC

## 2013-12-27 MED ORDER — DEXTROSE 50 % IV SOLN
INTRAVENOUS | Status: AC
  Administered 2013-12-27: 50 mL
  Filled 2013-12-27: qty 50

## 2013-12-27 MED ORDER — INSULIN GLARGINE 100 UNIT/ML ~~LOC~~ SOLN
15.0000 [IU] | Freq: Every day | SUBCUTANEOUS | Status: DC
  Administered 2013-12-27 – 2013-12-30 (×4): 15 [IU] via SUBCUTANEOUS
  Filled 2013-12-27 (×5): qty 0.15

## 2013-12-27 MED ORDER — WARFARIN SODIUM 4 MG PO TABS
4.0000 mg | ORAL_TABLET | Freq: Once | ORAL | Status: AC
  Administered 2013-12-27: 4 mg via ORAL
  Filled 2013-12-27 (×2): qty 1

## 2013-12-27 MED ORDER — FUROSEMIDE 10 MG/ML IJ SOLN
80.0000 mg | Freq: Four times a day (QID) | INTRAMUSCULAR | Status: DC
  Administered 2013-12-27 – 2013-12-31 (×14): 80 mg via INTRAVENOUS
  Filled 2013-12-27 (×16): qty 8

## 2013-12-27 NOTE — Progress Notes (Signed)
Inpatient Diabetes Program Recommendations  AACE/ADA: New Consensus Statement on Inpatient Glycemic Control (2013)  Target Ranges:  Prepandial:   less than 140 mg/dL      Peak postprandial:   less than 180 mg/dL (1-2 hours)      Critically ill patients:  140 - 180 mg/dL  Results for Claudia [name], Claudia [name] (MRN [id number]) as of 12/27/2013 10:50  Ref. Range 12/27/2013 05:40 12/27/2013 06:04 12/27/2013 06:48  Glucose-Capillary Latest Range: 70-99 mg/dL 44 (LL) 164 (H) 118 (H)   Inpatient Diabetes Program Recommendations Insulin - Basal: Decrease Lantus to 20 units Thank you  Raoul Pitch BSN, RN,CDE Inpatient Diabetes Coordinator [phone] (team pager)

## 2013-12-27 NOTE — Progress Notes (Signed)
PROGRESS NOTE  Claudia [name] KGU:[id number] DOB: Oct 18, 1931 DOA: 12/23/2013 PCP: Jenna Luo TOM, MD  Assessment/Plan: Fluid overload -  -Lasix 80 mg IV BID per cards.  -intake output and daily weights. -2-D echo done was 2 weeks ago which showed EF of 50-55%.  -Dopplers of the lower extremity no DVT  c diff diarrhea- flagyl  CAD status post CABG and mitral valve replacement with tissue valve recently last month   Atrial fibrillation presently in sinus rhythm - continue Coumadin per pharmacy.  Diabetes mellitus type 2 - on insulin. Decrease lantus due to hypoglycemia.  Chronic kidney disease stage 3-4 - see #1.  Chronic anemia - follow CBC.  Hyperlipidemia - continue statins.  Generalized weakness probably from deconditioning -  TSH ok  Serratia Marcesans Bacteremia during recent admission and had 15 days of antibiotic therapy. Patient as per nursing home [facility] is on ceftriaxone probably for UTI started on October 18 th. I have discussed with pharmacist and at this time we will hold ceftriaxone  Hyponatremia -fluid restrict  Leukocytosis but no fever- due to c diff   Code Status: full Family Communication: patient Disposition Plan: SNF Monday   Consultants:    Procedures:      HPI/Subjective: Nausea resolved today  Objective: Filed Vitals:   12/27/13 0513  BP: 130/50  Pulse: 69  Temp: 97.4 F (36.3 C)  Resp: 18    Intake/Output Summary (Last 24 hours) at 12/27/13 1202 Last data filed at 12/27/13 1100  Gross per 24 hour  Intake    713 ml  Output    630 ml  Net     83 ml   Filed Weights   12/25/13 0410 12/26/13 0300 12/27/13 0513  Weight: 89.812 kg (198 lb) 87.635 kg (193 lb 3.2 oz) 88.769 kg (195 lb 11.2 oz)    Exam:   General:  A+Ox3, NAD  Cardiovascular: rrr  Respiratory: clear, no wheezing  Abdomen: +BS, soft, obese  Musculoskeletal: +edema   Data Reviewed: Basic Metabolic Panel:  Recent Labs Lab 12/23/13 1843  12/24/13 0335 12/25/13 0319 12/26/13 0504 12/27/13 0332  NA 125* 128* 129* 127* 126*  K 5.0 4.7 4.7 4.1 DELTA CHECK NOTED  CL 86* 89* 90* 88* 85*  CO2 27 25 29 26 27   GLUCOSE 183* 89 70 114* 38*  BUN 36* 36* 36* 32* 32*  CREATININE 2.51* 2.49* 2.46* 2.22* 2.27*  CALCIUM 8.7 8.5 8.6 8.5 8.7   Liver Function Tests:  Recent Labs Lab 12/23/13 1843 12/24/13 0335  AST 14 13  ALT 16 14  ALKPHOS 103 102  BILITOT 0.5 0.4  PROT 6.6 6.0  ALBUMIN 2.7* 2.6*   No results found for this basename: LIPASE, AMYLASE,  in the last 168 hours No results found for this basename: AMMONIA,  in the last 168 hours CBC:  Recent Labs Lab 12/23/13 1843 12/24/13 0335 12/26/13 0504  WBC 10.4 8.0 16.8*  NEUTROABS  --  6.6  --   HGB 10.4* 9.7* 10.3*  HCT 31.8* 29.7* 31.3*  MCV 83.0 86.6 82.4  PLT 226 234 250   Cardiac Enzymes: No results found for this basename: CKTOTAL, CKMB, CKMBINDEX, TROPONINI,  in the last 168 hours BNP (last 3 results)  Recent Labs  12/25/13 0319 12/26/13 0504 12/27/13 0500  PROBNP 14909.0* 21119.0* 19503.0*   CBG:  Recent Labs Lab 12/26/13 2301 12/27/13 0540 12/27/13 0604 12/27/13 0648 12/27/13 1108  GLUCAP 82 44* 164* 118* 169*    Recent Results (from the past  240 hour(s))  CLOSTRIDIUM DIFFICILE BY PCR     Status: Abnormal   Collection Time    12/26/13  1:13 PM      Result Value Ref Range Status   C difficile by pcr POSITIVE (*) NEGATIVE Final   Comment: CRITICAL RESULT CALLED TO, READ BACK BY AND VERIFIED WITH:     DAVIS RN 14:15 12/26/13 ([email or web address])     Studies: No results found.  Scheduled Meds: . citalopram  20 mg Oral Daily  . feeding supplement (GLUCERNA SHAKE)  237 mL Oral TID WC  . fluticasone  1 spray Each Nare Daily  . furosemide  80 mg Intravenous BID  . insulin aspart  0-9 Units Subcutaneous TID WC  . insulin glargine  15 Units Subcutaneous QHS  . metoprolol tartrate  25 mg Oral BID  . metroNIDAZOLE  500 mg Oral 3 times per day   . nystatin cream   Topical TID  . pantoprazole  40 mg Oral Daily  . simvastatin  20 mg Oral QPM  . sodium chloride  3 mL Intravenous Q12H  . sodium chloride  3 mL Intravenous Q12H  . warfarin  4 mg Oral ONCE-1800  . Warfarin - Pharmacist Dosing Inpatient   Does not apply q1800   Continuous Infusions:  Antibiotics Given (last 72 hours)   Date/Time Action Medication Dose   12/26/13 1604 Given   metroNIDAZOLE (FLAGYL) tablet 500 mg 500 mg   12/26/13 2202 Given   metroNIDAZOLE (FLAGYL) tablet 500 mg 500 mg   12/27/13 0600 Given   metroNIDAZOLE (FLAGYL) tablet 500 mg 500 mg      Principal Problem:   Volume overload Active Problems:   DM (diabetes mellitus), type 2   Hypercholesterolemia   CHF (congestive heart failure)   S/P CABG x 2   S/P mitral valve replacement with bioprosthetic valve   CKD (chronic kidney disease) stage 4, GFR 15-29 ml/min   Fluid overload    Time spent: [place] [place] [facility] Pager [phone]/23/2015, 12:02 PM  LOS: 4 days

## 2013-12-27 NOTE — Progress Notes (Signed)
      [address]       [place] [address]             [phone]           Subjective: Patient alert and in good humor this am.  Objective: Vital signs in last 24 hours: Temp:  [97.4 F (36.3 C)-98.4 F (36.9 C)] 97.4 F (36.3 C) (10/23 0513) Pulse Rate:  [65-80] 69 (10/23 0513) Cardiac Rhythm:  [-] Normal sinus rhythm (10/22 2015) Resp:  [16-20] 18 (10/23 0513) BP: (109-130)/(44-56) 130/50 mmHg (10/23 0513) SpO2:  [97 %-100 %] 97 % (10/23 0513) Weight:  [195 lb 11.2 oz (88.769 kg)] 195 lb 11.2 oz (88.769 kg) (10/23 0513)   Current Weight  12/27/13 195 lb 11.2 oz (88.769 kg)      Intake/Output from previous day: 10/22 0701 - 10/23 0700 In: 713 [P.O.:710; I.V.:3] Out: 480 [Urine:475; Stool:5]   Physical Exam:  Cardiovascular: RRR Extremities: Bilateral lower extremity edema. Wounds: Small opening distal ankle EVH site. Minor serous drainage. No erythema.   Lab Results: CBC: Recent Labs  12/26/13 0504  WBC 16.8*  HGB 10.3*  HCT 31.3*  PLT 250   BMET:  Recent Labs  12/26/13 0504 12/27/13 0332  NA 127* 126*  K 4.1 DELTA CHECK NOTED  CL 88* 85*  CO2 26 27  GLUCOSE 114* 38*  BUN 32* 32*  CREATININE 2.22* 2.27*  CALCIUM 8.5 8.7    PT/INR:  Lab Results  Component Value Date   INR 1.53* 12/27/2013   INR 1.42 12/26/2013   INR 1.26 12/25/2013   ABG:  INR: Will add last result for INR, ABG once components are confirmed Will add last 4 CBG results once components are confirmed  Assessment/Plan:  1. CV - SR. On Lopressor 25 bid and Coumadin. INR up to 1.53. Per cardiology. 2.  Pulmonary - Encourage incentive spirometer 3. Acute on chronic heart failure. On Lasix 80 IV bid.Per cardiology. 4.  Anemia of chronic disease - Last H and H 10.3 and 31.3 5. Hyponatremia-sodium 126 6. CKD (stage III)-Creatinine remains 2.27 7. Left ankle wound-no cellulitis. Continue local wound care. 8. Failure to thrive  ZIMMERMAN,DONIELLE  MPA-C 12/27/2013,8:26 AM

## 2013-12-27 NOTE — Progress Notes (Signed)
UR complete.  Sameena Artus RN, MSN 

## 2013-12-27 NOTE — Progress Notes (Signed)
Subjective:  Patient feels much better, leg edema is improving.  Patient being treated for C. difficile diarrhea, leg edema still persistent and has significant weeping from the left leg at the vein harvest site. No other complaints today. Overall patient states that she's feeling better and her strength is improved. Objective:  Vital Signs in the last 24 hours: Temp:  [97.4 F (36.3 C)-98.4 F (36.9 C)] 98 F (36.7 C) (10/23 1422) Pulse Rate:  [65-69] 66 (10/23 1422) Resp:  [16-19] 19 (10/23 1422) BP: (109-130)/(44-50) 114/45 mmHg (10/23 1422) SpO2:  [97 %-100 %] 100 % (10/23 1422) Weight:  [88.769 kg (195 lb 11.2 oz)] 88.769 kg (195 lb 11.2 oz) (10/23 0513)  Intake/Output from previous day: 10/22 0701 - 10/23 0700 In: 713 [P.O.:710; I.V.:3] Out: 480 [Urine:475; Stool:5]  Physical Exam: General appearance: alert, cooperative, appears stated age, no distress and mildly obese  Lungs: Bilateral basal coarse crackles, left worse than the right.  Chest wall: no tenderness, right chest infraclavicular area has a small open wound. Cardiac exam: S1 and S2 is normal, there is no gallop or murmur appreciated. No rub.  Abdomen soft, nontender, no organomegaly. No significant ascites evident by physical exam.  Neck: No JVD. No hepatojugular reflux. No thyromegaly.  Extremities: 4+ edema with oozing in the left leg at the vein harvest site. 2- 3+ edema right leg No obvious signs of infection.  Lab Results: BMP  Recent Labs  12/25/13 0319 12/26/13 0504 12/27/13 0332  NA 129* 127* 126*  K 4.7 4.1 DELTA CHECK NOTED  CL 90* 88* 85*  CO2 29 26 27   GLUCOSE 70 114* 38*  BUN 36* 32* 32*  CREATININE 2.46* 2.22* 2.27*  CALCIUM 8.6 8.5 8.7  GFRNONAA 18* 20* 19*  GFRAA 20* 23* 22*    CBC  Recent Labs Lab 12/24/13 0335 12/26/13 0504  WBC 8.0 16.8*  RBC 3.43* 3.80*  HGB 9.7* 10.3*  HCT 29.7* 31.3*  PLT 234 250  MCV 86.6 82.4  MCH 28.3 27.1  MCHC 32.7 32.9  RDW 15.6* 15.5   LYMPHSABS 0.6*  --   MONOABS 0.7  --   EOSABS 0.1  --   BASOSABS 0.0  --     HEMOGLOBIN A1C Lab Results  Component Value Date   HGBA1C 7.2* 11/06/2013   MPG 160* 11/06/2013    Cardiac Panel (last 3 results)  Recent Labs  06/26/13 0539 06/26/13 1520 06/27/13 0523 11/06/13 1125  CKTOTAL 318* 561* 311*  --   CKMB 24.8* 37.1* 11.6*  --   TROPONINI 2.91* 14.02* 7.84* 0.66*  RELINDX 7.8* 6.6* 3.7*  --     BNP (last 3 results)  Recent Labs  12/25/13 0319 12/26/13 0504 12/27/13 0500  PROBNP 14909.0* 21119.0* 19503.0*    TSH  Recent Labs  12/24/13 0335  TSH 1.610    CHOLESTEROL No results found for this basename: CHOL,  in the last 8760 hours  Hepatic Function Panel  Recent Labs  11/09/13 0345  12/06/13 0909 12/23/13 1843 12/24/13 0335  PROT 4.5*  < > 6.4 6.6 6.0  ALBUMIN 3.1*  < > 2.8* 2.7* 2.6*  AST 78*  < > 15 14 13   ALT 19  < > 11 16 14   ALKPHOS 31*  < > 77 103 102  BILITOT 1.6*  < > 0.5 0.5 0.4  BILIDIR 0.4*  --   --   --   --   IBILI 1.2*  --   --   --   --   < > =  values in this interval not displayed.  Scheduled Meds: . citalopram  20 mg Oral Daily  . feeding supplement (GLUCERNA SHAKE)  237 mL Oral TID WC  . fluticasone  1 spray Each Nare Daily  . [START ON 12/28/2013] furosemide  80 mg Intravenous 4 times per day  . insulin aspart  0-9 Units Subcutaneous TID WC  . insulin glargine  15 Units Subcutaneous QHS  . metoprolol tartrate  25 mg Oral BID  . metroNIDAZOLE  500 mg Oral 3 times per day  . nystatin cream   Topical TID  . pantoprazole  40 mg Oral Daily  . simvastatin  20 mg Oral QPM  . sodium chloride  3 mL Intravenous Q12H  . sodium chloride  3 mL Intravenous Q12H  . warfarin  4 mg Oral ONCE-1800  . Warfarin - Pharmacist Dosing Inpatient   Does not apply q1800   Continuous Infusions:  PRN Meds:.acetaminophen, acetaminophen, dextrose, ondansetron (ZOFRAN) IV, ondansetron  Assessment/Plan:  1. Fluid overload state with 4+ leg  edema, bilateral, improving, however she has significant amount of fluid leaking from her old, with reducing the dose of Lasix, she has gained 1 kg in weight since yesterday. I will increase furosemide to every 6 hours again. Also place SCD, she could not tolerate support stockings due to leg edema. SCD may help with fluid mobilization. 2. Chronic renal failure, stage III chronic kidney disease, stable serum creatinine.  3. CAD of native coronary vessels, status post CABG with LIMA to LAD and SVG to OM along with replacement of mitral valve with a 25 mm magna mitral Ease bioprosthetic valve on 11/05/2013. Echocardiogram 12/06/2013 revealing preserved LVEF, no obvious vegetation of the mitral valve, no pericardial effusion.  4. Acute on chronic diastolic heart failure  5. Diabetes mellitus type 2 uncontrolled with renal manifestation  6. Hypoalbuminemia  7. Anemia of chronic disease  8. Failure to thrive in an adult  Laverda Page, M.D. 12/27/2013, 6:09 PM [facility] Pager: [phone] Office: [phone] If no answer: [phone]

## 2013-12-27 NOTE — Progress Notes (Signed)
[place] for Coumadin Indication: atrial fibrillation   Recent Labs  12/25/13 0319 12/26/13 0504 12/27/13 0332  HGB  --  10.3*  --   HCT  --  31.3*  --   PLT  --  250  --   LABPROT 16.0* 17.5* 18.5*  INR 1.26 1.42 1.53*  CREATININE 2.46* 2.22* 2.27*    Estimated Creatinine Clearance: 23 ml/min (by C-G formula based on Cr of 2.27).   Assessment: 80 yo female admitted with CHF exacerbation, h/o Afib, continue on Coumadin, INR 1.53. CBC stable  PTA dose: 3 mg daily per nursing home MAR  Goal of Therapy:  INR 2-3 Monitor platelets by anticoagulation protocol: Yes   Plan:  Coumadin 4 mg po x 1 today Daily INR Monitor CBC, s/sx of bleeding.    Thanks for allowing pharmacy to be a part of this patient's care.  Excell Seltzer, PharmD Clinical Pharmacist, [phone] 12/27/2013 10:14 AM

## 2013-12-28 DIAGNOSIS — I480 Paroxysmal atrial fibrillation: Secondary | ICD-10-CM

## 2013-12-28 LAB — BASIC METABOLIC PANEL
ANION GAP: 12 (ref 5–15)
BUN: 36 mg/dL — ABNORMAL HIGH (ref 6–23)
CALCIUM: 8.1 mg/dL — AB (ref 8.4–10.5)
CO2: 27 mEq/L (ref 19–32)
Chloride: 88 mEq/L — ABNORMAL LOW (ref 96–112)
Creatinine, Ser: 2.44 mg/dL — ABNORMAL HIGH (ref 0.50–1.10)
GFR calc non Af Amer: 18 mL/min — ABNORMAL LOW (ref >90)
GFR, EST AFRICAN AMERICAN: 21 mL/min — AB (ref >90)
GLUCOSE: 169 mg/dL — AB (ref 70–99)
Potassium: 4 mEq/L (ref 3.7–5.3)
SODIUM: 127 meq/L — AB (ref 137–147)

## 2013-12-28 LAB — CBC
HEMATOCRIT: 30.4 % — AB (ref 36.0–46.0)
Hemoglobin: 9.8 g/dL — ABNORMAL LOW (ref 12.0–15.0)
MCH: 26.4 pg (ref 26.0–34.0)
MCHC: 32.2 g/dL (ref 30.0–36.0)
MCV: 81.9 fL (ref 78.0–100.0)
Platelets: 220 10*3/uL (ref 150–400)
RBC: 3.71 MIL/uL — ABNORMAL LOW (ref 3.87–5.11)
RDW: 15.6 % — ABNORMAL HIGH (ref 11.5–15.5)
WBC: 8.4 10*3/uL (ref 4.0–10.5)

## 2013-12-28 LAB — PROTIME-INR
INR: 1.93 — AB (ref 0.00–1.49)
Prothrombin Time: 22.2 seconds — ABNORMAL HIGH (ref 11.6–15.2)

## 2013-12-28 LAB — PRO B NATRIURETIC PEPTIDE: PRO B NATRI PEPTIDE: 14328 pg/mL — AB (ref 0–450)

## 2013-12-28 LAB — GLUCOSE, CAPILLARY
GLUCOSE-CAPILLARY: 130 mg/dL — AB (ref 70–99)
GLUCOSE-CAPILLARY: 229 mg/dL — AB (ref 70–99)
Glucose-Capillary: 114 mg/dL — ABNORMAL HIGH (ref 70–99)
Glucose-Capillary: 130 mg/dL — ABNORMAL HIGH (ref 70–99)

## 2013-12-28 MED ORDER — TEMAZEPAM 7.5 MG PO CAPS
7.5000 mg | ORAL_CAPSULE | Freq: Every evening | ORAL | Status: DC | PRN
  Administered 2013-12-29 – 2013-12-30 (×3): 7.5 mg via ORAL
  Filled 2013-12-28 (×3): qty 1

## 2013-12-28 MED ORDER — WARFARIN SODIUM 3 MG PO TABS
3.0000 mg | ORAL_TABLET | Freq: Once | ORAL | Status: AC
  Administered 2013-12-28: 3 mg via ORAL
  Filled 2013-12-28: qty 1

## 2013-12-28 MED ORDER — DEMECLOCYCLINE HCL 150 MG PO TABS
150.0000 mg | ORAL_TABLET | Freq: Four times a day (QID) | ORAL | Status: DC
  Administered 2013-12-28 – 2013-12-31 (×13): 150 mg via ORAL
  Filled 2013-12-28 (×15): qty 1

## 2013-12-28 NOTE — Progress Notes (Signed)
PROGRESS NOTE  DORLENE FOOTMAN FTD:[id number] DOB: 04/14/1931 DOA: 12/23/2013 PCP: Jenna Luo TOM, MD  Assessment/Plan: Fluid overload -  -Lasix  IV  per cards.  -intake output and daily weights. -2-D echo done was 2 weeks ago which showed EF of 50-55%.  -Dopplers of the lower extremity no DVT  c diff diarrhea- flagyl  CAD status post CABG and mitral valve replacement with tissue valve recently last month   Atrial fibrillation presently in sinus rhythm - continue Coumadin per pharmacy.  Diabetes mellitus type 2 - on insulin. Decrease lantus due to hypoglycemia.  Chronic kidney disease stage 3-4 - see #1.  Chronic anemia - follow CBC.  Hyperlipidemia - continue statins.  Generalized weakness probably from deconditioning -  TSH ok  Serratia Marcesans Bacteremia during recent admission and had 15 days of antibiotic therapy. Patient as per nursing home [facility] is on ceftriaxone probably for UTI started on October 18 th. I have discussed with pharmacist and at this time we will hold ceftriaxone  Hyponatremia -fluid restrict  Leukocytosis but no fever- due to c diff   Code Status: full Family Communication: patient Disposition Plan: SNF Monday   Consultants:    Procedures:      HPI/Subjective: Nausea resolved today  Objective: Filed Vitals:   12/28/13 0410  BP: 92/38  Pulse: 63  Temp: 98 F (36.7 C)  Resp: 18    Intake/Output Summary (Last 24 hours) at 12/28/13 0902 Last data filed at 12/28/13 0623  Gross per 24 hour  Intake    240 ml  Output    827 ml  Net   -587 ml   Filed Weights   12/26/13 0300 12/27/13 0513 12/28/13 0406  Weight: 87.635 kg (193 lb 3.2 oz) 88.769 kg (195 lb 11.2 oz) 87.635 kg (193 lb 3.2 oz)    Exam:   General:  A+Ox3, NAD  Cardiovascular: rrr  Respiratory: clear, no wheezing  Abdomen: +BS, soft, obese  Musculoskeletal: +edema   Data Reviewed: Basic Metabolic Panel:  Recent Labs Lab 12/24/13 0335  12/25/13 0319 12/26/13 0504 12/27/13 0332 12/28/13 0318  NA 128* 129* 127* 126* 127*  K 4.7 4.7 4.1 DELTA CHECK NOTED 4.0  CL 89* 90* 88* 85* 88*  CO2 25 29 26 27 27   GLUCOSE 89 70 114* 38* 169*  BUN 36* 36* 32* 32* 36*  CREATININE 2.49* 2.46* 2.22* 2.27* 2.44*  CALCIUM 8.5 8.6 8.5 8.7 8.1*   Liver Function Tests:  Recent Labs Lab 12/23/13 1843 12/24/13 0335  AST 14 13  ALT 16 14  ALKPHOS 103 102  BILITOT 0.5 0.4  PROT 6.6 6.0  ALBUMIN 2.7* 2.6*   No results found for this basename: LIPASE, AMYLASE,  in the last 168 hours No results found for this basename: AMMONIA,  in the last 168 hours CBC:  Recent Labs Lab 12/23/13 1843 12/24/13 0335 12/26/13 0504 12/28/13 0318  WBC 10.4 8.0 16.8* 8.4  NEUTROABS  --  6.6  --   --   HGB 10.4* 9.7* 10.3* 9.8*  HCT 31.8* 29.7* 31.3* 30.4*  MCV 83.0 86.6 82.4 81.9  PLT 226 234 250 220   Cardiac Enzymes: No results found for this basename: CKTOTAL, CKMB, CKMBINDEX, TROPONINI,  in the last 168 hours BNP (last 3 results)  Recent Labs  12/26/13 0504 12/27/13 0500 12/28/13 0318  PROBNP 21119.0* 19503.0* 14328.0*   CBG:  Recent Labs Lab 12/27/13 0648 12/27/13 1108 12/27/13 1616 12/27/13 2108 12/28/13 0624  GLUCAP 118* 169* 172* 172*  114*    Recent Results (from the past 240 hour(s))  CLOSTRIDIUM DIFFICILE BY PCR     Status: Abnormal   Collection Time    12/26/13  1:13 PM      Result Value Ref Range Status   C difficile by pcr POSITIVE (*) NEGATIVE Final   Comment: CRITICAL RESULT CALLED TO, READ BACK BY AND VERIFIED WITH:     DAVIS RN 14:15 12/26/13 ([email or web address])     Studies: No results found.  Scheduled Meds: . citalopram  20 mg Oral Daily  . feeding supplement (GLUCERNA SHAKE)  237 mL Oral TID WC  . fluticasone  1 spray Each Nare Daily  . furosemide  80 mg Intravenous 4 times per day  . insulin aspart  0-9 Units Subcutaneous TID WC  . insulin glargine  15 Units Subcutaneous QHS  . metoprolol tartrate  25  mg Oral BID  . metroNIDAZOLE  500 mg Oral 3 times per day  . nystatin cream   Topical TID  . pantoprazole  40 mg Oral Daily  . simvastatin  20 mg Oral QPM  . sodium chloride  3 mL Intravenous Q12H  . sodium chloride  3 mL Intravenous Q12H  . Warfarin - Pharmacist Dosing Inpatient   Does not apply q1800   Continuous Infusions:  Antibiotics Given (last 72 hours)   Date/Time Action Medication Dose   12/26/13 1604 Given   metroNIDAZOLE (FLAGYL) tablet 500 mg 500 mg   12/26/13 2202 Given   metroNIDAZOLE (FLAGYL) tablet 500 mg 500 mg   12/27/13 0600 Given   metroNIDAZOLE (FLAGYL) tablet 500 mg 500 mg   12/27/13 1508 Given   metroNIDAZOLE (FLAGYL) tablet 500 mg 500 mg   12/27/13 2144 Given   metroNIDAZOLE (FLAGYL) tablet 500 mg 500 mg   12/28/13 0553 Given   metroNIDAZOLE (FLAGYL) tablet 500 mg 500 mg      Principal Problem:   Volume overload Active Problems:   DM (diabetes mellitus), type 2   Hypercholesterolemia   CHF (congestive heart failure)   S/P CABG x 2   S/P mitral valve replacement with bioprosthetic valve   CKD (chronic kidney disease) stage 4, GFR 15-29 ml/min   Fluid overload    Time spent: [place], [place] [facility] Pager [phone]/24/2015, 9:02 AM  LOS: 5 days

## 2013-12-28 NOTE — Progress Notes (Signed)
[place] for Coumadin Indication: atrial fibrillation   Recent Labs  12/26/13 0504 12/27/13 0332 12/28/13 0318  HGB 10.3*  --  9.8*  HCT 31.3*  --  30.4*  PLT 250  --  220  LABPROT 17.5* 18.5* 22.2*  INR 1.42 1.53* 1.93*  CREATININE 2.22* 2.27* 2.44*    Estimated Creatinine Clearance: 21.3 ml/min (by C-G formula based on Cr of 2.44).   Assessment: 80 yo female admitted with CHF exacerbation, h/o Afib, continue on Coumadin, INR 1.93. CBC stable  PTA dose: 3 mg daily per nursing home MAR  Goal of Therapy:  INR 2-3 Monitor platelets by anticoagulation protocol: Yes   Plan:  Coumadin 3 mg po x 1 today Daily INR Monitor CBC, s/sx of bleeding.    Thanks for allowing pharmacy to be a part of this patient's care.  Excell Seltzer, PharmD Clinical Pharmacist, [phone] 12/28/2013 12:40 PM

## 2013-12-28 NOTE — Progress Notes (Signed)
      [address]       [place] [address]             [phone]          Subjective: Feeling a bit better, ate well yesterday but not this am. Diarrhea improving and stools more formed.  Objective: Vital signs in last 24 hours: Temp:  [98 F (36.7 C)-98.2 F (36.8 C)] 98 F (36.7 C) (10/24 0410) Pulse Rate:  [63-69] 63 (10/24 0410) Cardiac Rhythm:  [-] Normal sinus rhythm (10/24 0841) Resp:  [12-19] 18 (10/24 0410) BP: (92-115)/(38-45) 92/38 mmHg (10/24 0410) SpO2:  [96 %-100 %] 96 % (10/24 0410) Weight:  [193 lb 3.2 oz (87.635 kg)] 193 lb 3.2 oz (87.635 kg) (10/24 0406)  Hemodynamic parameters for last 24 hours:    Intake/Output from previous day: 10/23 0701 - 10/24 0700 In: 240 [P.O.:240] Out: 1452 [Urine:1450; Stool:2] Intake/Output this shift:    General appearance: alert, cooperative, fatigued and no distress Heart: regular rate and rhythm Lungs: some basilar crackles Abdomen: soft, nondistended, nontender Extremities: + BLE edema Wound: stable  Lab Results:  Recent Labs  12/26/13 0504 12/28/13 0318  WBC 16.8* 8.4  HGB 10.3* 9.8*  HCT 31.3* 30.4*  PLT 250 220   BMET:  Recent Labs  12/27/13 0332 12/28/13 0318  NA 126* 127*  K DELTA CHECK NOTED 4.0  CL 85* 88*  CO2 27 27  GLUCOSE 38* 169*  BUN 32* 36*  CREATININE 2.27* 2.44*  CALCIUM 8.7 8.1*    PT/INR:  Recent Labs  12/28/13 0318  LABPROT 22.2*  INR 1.93*   ABG    Component Value Date/Time   PHART 7.459* 12/06/2013 1000   HCO3 25.3* 12/06/2013 1000   TCO2 26 12/06/2013 1000   ACIDBASEDEF 7.0* 11/11/2013 1642   O2SAT 74.5 12/06/2013 1455   CBG (last 3)   Recent Labs  12/27/13 1616 12/27/13 2108 12/28/13 0624  GLUCAP 172* 172* 114*    Meds Scheduled Meds: . citalopram  20 mg Oral Daily  . feeding supplement (GLUCERNA SHAKE)  237 mL Oral TID WC  . fluticasone  1 spray Each Nare Daily  . furosemide  80 mg Intravenous 4 times per day  . insulin aspart   0-9 Units Subcutaneous TID WC  . insulin glargine  15 Units Subcutaneous QHS  . metoprolol tartrate  25 mg Oral BID  . metroNIDAZOLE  500 mg Oral 3 times per day  . nystatin cream   Topical TID  . pantoprazole  40 mg Oral Daily  . simvastatin  20 mg Oral QPM  . sodium chloride  3 mL Intravenous Q12H  . sodium chloride  3 mL Intravenous Q12H  . Warfarin - Pharmacist Dosing Inpatient   Does not apply q1800   Continuous Infusions:  PRN Meds:.acetaminophen, acetaminophen, ondansetron (ZOFRAN) IV, ondansetron, temazepam  Xrays No results found.  Assessment/Plan: 1 conts management per primary and cardiology- incis are stable   LOS: 5 days    Claudia [name] E 12/28/2013

## 2013-12-28 NOTE — Progress Notes (Signed)
Pt had 3 semi formed brown stools this shift

## 2013-12-29 LAB — GLUCOSE, CAPILLARY
GLUCOSE-CAPILLARY: 149 mg/dL — AB (ref 70–99)
Glucose-Capillary: 128 mg/dL — ABNORMAL HIGH (ref 70–99)
Glucose-Capillary: 223 mg/dL — ABNORMAL HIGH (ref 70–99)
Glucose-Capillary: 168 mg/dL — ABNORMAL HIGH (ref 70–99)

## 2013-12-29 LAB — BASIC METABOLIC PANEL
Anion gap: 13 (ref 5–15)
BUN: 37 mg/dL — ABNORMAL HIGH (ref 6–23)
CO2: 28 mEq/L (ref 19–32)
Calcium: 8.1 mg/dL — ABNORMAL LOW (ref 8.4–10.5)
Chloride: 87 mEq/L — ABNORMAL LOW (ref 96–112)
Creatinine, Ser: 2.42 mg/dL — ABNORMAL HIGH (ref 0.50–1.10)
GFR calc Af Amer: 21 mL/min — ABNORMAL LOW (ref >90)
GFR, EST NON AFRICAN AMERICAN: 18 mL/min — AB (ref >90)
Glucose, Bld: 160 mg/dL — ABNORMAL HIGH (ref 70–99)
POTASSIUM: 3.8 meq/L (ref 3.7–5.3)
SODIUM: 128 meq/L — AB (ref 137–147)

## 2013-12-29 LAB — PROTIME-INR
INR: 1.9 — ABNORMAL HIGH (ref 0.00–1.49)
Prothrombin Time: 21.9 seconds — ABNORMAL HIGH (ref 11.6–15.2)

## 2013-12-29 LAB — PRO B NATRIURETIC PEPTIDE: PRO B NATRI PEPTIDE: 10783 pg/mL — AB (ref 0–450)

## 2013-12-29 LAB — SODIUM
SODIUM: 129 meq/L — AB (ref 137–147)
Sodium: 127 mEq/L — ABNORMAL LOW (ref 137–147)

## 2013-12-29 MED ORDER — WARFARIN SODIUM 3 MG PO TABS
3.5000 mg | ORAL_TABLET | Freq: Every day | ORAL | Status: DC
  Administered 2013-12-29: 3.5 mg via ORAL
  Filled 2013-12-29 (×2): qty 1

## 2013-12-29 MED ORDER — TOLVAPTAN 15 MG PO TABS
15.0000 mg | ORAL_TABLET | ORAL | Status: DC
  Administered 2013-12-29 – 2013-12-30 (×2): 15 mg via ORAL
  Filled 2013-12-29 (×3): qty 1

## 2013-12-29 NOTE — Progress Notes (Addendum)
PROGRESS NOTE  Claudia [name]:[id number] DOB: Aug 23, 1931 DOA: 12/23/2013 PCP: Odette Fraction, MD  Claudia [name] is a 80 y.o. female with history of CAD who had recent CABG last month and mitral valve replacement with pericardial tissue valve, chronic kidney disease stage 3-4, diabetes mellitus was referred to the ER patient was found to have increasing lower extremity edema. Patient denies any shortness of breath or chest pain but does complain of weakness. Patient is on Lasix 80 mg by mouth daily despite which patient has been found to be increasingly gaining weight and lower extremity edema. Patient recently had CABG and mitral valve replacement which was complicated by bacteremia metabolic encephalopathy and also had fluid overload at the time.   Assessment/Plan: Fluid overload -  -Lasix  IV  per cards.  -intake output and daily weights. -2-D echo done was 2 weeks ago which showed EF of 50-55%.  -Dopplers of the lower extremity no DVT  c diff diarrhea- flagyl -improvement in diarrhea  CAD status post CABG and mitral valve replacement with tissue valve recently last month   Atrial fibrillation presently in sinus rhythm - continue Coumadin per pharmacy.  Diabetes mellitus type 2 - on insulin. Decrease lantus due to hypoglycemia.  Chronic kidney disease stage 3-4 - see #1.  Chronic anemia - follow CBC.  Hyperlipidemia - continue statins.  Generalized weakness probably from deconditioning -  TSH ok  Serratia Marcesans Bacteremia during recent admission and had 15 days of antibiotic therapy. Patient as per nursing home [facility] is on ceftriaxone probably for UTI started on October 18 th. I have discussed with pharmacist and at this time we will hold ceftriaxone  Hyponatremia -fluid restrict -per cards- trial of samsca possible  Leukocytosis but no fever- due to c diff   Code Status: full Family Communication: patient Disposition Plan: SNF when ok with  cards   Consultants:    Procedures:      HPI/Subjective: Slept well last PM No nausea No CP, no SOB  Objective: Filed Vitals:   12/29/13 0625  BP: 132/47  Pulse: 65  Temp: 98.6 F (37 C)  Resp: 17    Intake/Output Summary (Last 24 hours) at 12/29/13 1128 Last data filed at 12/29/13 0630  Gross per 24 hour  Intake    170 ml  Output   1301 ml  Net  -1131 ml   Filed Weights   12/27/13 0513 12/28/13 0406 12/29/13 0625  Weight: 88.769 kg (195 lb 11.2 oz) 87.635 kg (193 lb 3.2 oz) 86.955 kg (191 lb 11.2 oz)    Exam:   General:  A+Ox3, NAD  Cardiovascular: rrr  Respiratory: clear, no wheezing  Abdomen: +BS, soft, obese  Musculoskeletal: +edema, less weeping from left LE wound  Data Reviewed: Basic Metabolic Panel:  Recent Labs Lab 12/25/13 0319 12/26/13 0504 12/27/13 0332 12/28/13 0318 12/29/13 0340  NA 129* 127* 126* 127* 128*  K 4.7 4.1 DELTA CHECK NOTED 4.0 3.8  CL 90* 88* 85* 88* 87*  CO2 29 26 27 27 28   GLUCOSE 70 114* 38* 169* 160*  BUN 36* 32* 32* 36* 37*  CREATININE 2.46* 2.22* 2.27* 2.44* 2.42*  CALCIUM 8.6 8.5 8.7 8.1* 8.1*   Liver Function Tests:  Recent Labs Lab 12/23/13 1843 12/24/13 0335  AST 14 13  ALT 16 14  ALKPHOS 103 102  BILITOT 0.5 0.4  PROT 6.6 6.0  ALBUMIN 2.7* 2.6*   No results found for this basename: LIPASE, AMYLASE,  in the last  168 hours No results found for this basename: AMMONIA,  in the last 168 hours CBC:  Recent Labs Lab 12/23/13 1843 12/24/13 0335 12/26/13 0504 12/28/13 0318  WBC 10.4 8.0 16.8* 8.4  NEUTROABS  --  6.6  --   --   HGB 10.4* 9.7* 10.3* 9.8*  HCT 31.8* 29.7* 31.3* 30.4*  MCV 83.0 86.6 82.4 81.9  PLT 226 234 250 220   Cardiac Enzymes: No results found for this basename: CKTOTAL, CKMB, CKMBINDEX, TROPONINI,  in the last 168 hours BNP (last 3 results)  Recent Labs  12/27/13 0500 12/28/13 0318 12/29/13 0340  PROBNP 19503.0* 14328.0* 10783.0*   CBG:  Recent Labs Lab  12/28/13 1106 12/28/13 1611 12/28/13 2141 12/29/13 0624 12/29/13 1121  GLUCAP 130* 130* 229* 128* 223*    Recent Results (from the past 240 hour(s))  CLOSTRIDIUM DIFFICILE BY PCR     Status: Abnormal   Collection Time    12/26/13  1:13 PM      Result Value Ref Range Status   C difficile by pcr POSITIVE (*) NEGATIVE Final   Comment: CRITICAL RESULT CALLED TO, READ BACK BY AND VERIFIED WITH:     DAVIS RN 14:15 12/26/13 ([email or web address])     Studies: No results found.  Scheduled Meds: . citalopram  20 mg Oral Daily  . demeclocycline  150 mg Oral QID  . feeding supplement (GLUCERNA SHAKE)  237 mL Oral TID WC  . fluticasone  1 spray Each Nare Daily  . furosemide  80 mg Intravenous 4 times per day  . insulin aspart  0-9 Units Subcutaneous TID WC  . insulin glargine  15 Units Subcutaneous QHS  . metoprolol tartrate  25 mg Oral BID  . metroNIDAZOLE  500 mg Oral 3 times per day  . nystatin cream   Topical TID  . pantoprazole  40 mg Oral Daily  . simvastatin  20 mg Oral QPM  . sodium chloride  3 mL Intravenous Q12H  . sodium chloride  3 mL Intravenous Q12H  . tolvaptan  15 mg Oral Q24H  . Warfarin - Pharmacist Dosing Inpatient   Does not apply q1800   Continuous Infusions:  Antibiotics Given (last 72 hours)   Date/Time Action Medication Dose   12/26/13 1604 Given   metroNIDAZOLE (FLAGYL) tablet 500 mg 500 mg   12/26/13 2202 Given   metroNIDAZOLE (FLAGYL) tablet 500 mg 500 mg   12/27/13 0600 Given   metroNIDAZOLE (FLAGYL) tablet 500 mg 500 mg   12/27/13 1508 Given   metroNIDAZOLE (FLAGYL) tablet 500 mg 500 mg   12/27/13 2144 Given   metroNIDAZOLE (FLAGYL) tablet 500 mg 500 mg   12/28/13 0553 Given   metroNIDAZOLE (FLAGYL) tablet 500 mg 500 mg   12/28/13 1439 Given   metroNIDAZOLE (FLAGYL) tablet 500 mg 500 mg   12/28/13 1439 Given   demeclocycline (DECLOMYCIN) tablet 150 mg 150 mg   12/28/13 1724 Given   demeclocycline (DECLOMYCIN) tablet 150 mg 150 mg   12/28/13 2202 Given    demeclocycline (DECLOMYCIN) tablet 150 mg 150 mg   12/28/13 2203 Given   metroNIDAZOLE (FLAGYL) tablet 500 mg 500 mg   12/29/13 3382 Given   metroNIDAZOLE (FLAGYL) tablet 500 mg 500 mg   12/29/13 1122 Given   demeclocycline (DECLOMYCIN) tablet 150 mg 150 mg      Principal Problem:   Volume overload Active Problems:   DM (diabetes mellitus), type 2   Hypercholesterolemia   CHF (congestive heart failure)   S/P CABG  x 2   S/P mitral valve replacement with bioprosthetic valve   CKD (chronic kidney disease) stage 4, GFR 15-29 ml/min   Fluid overload    Time spent: [place], [place] [facility] Pager [phone]/25/2015, 11:28 AM  LOS: 6 days

## 2013-12-29 NOTE — Progress Notes (Signed)
Subjective:  Patient feels much better, leg edema is improving.  Patient being treated for C. difficile diarrhea, leg edema still persistent and has significant weeping from the left leg at the vein harvest site. No other complaints today. Overall patient states that she's feeling better and her strength is improved.  Objective:  Vital Signs in the last 24 hours: Temp:  [98.2 F (36.8 C)-98.9 F (37.2 C)] 98.6 F (37 C) (10/25 0625) Pulse Rate:  [65-73] 65 (10/25 0625) Resp:  [17-18] 17 (10/25 0625) BP: (110-132)/(29-47) 132/47 mmHg (10/25 0625) SpO2:  [96 %-97 %] 97 % (10/25 0625) Weight:  [86.955 kg (191 lb 11.2 oz)] 86.955 kg (191 lb 11.2 oz) (10/25 0625)  Intake/Output from previous day: 10/24 0701 - 10/25 0700 In: 530 [P.O.:530] Out: 1301 [Urine:1300; Stool:1]  Physical Exam: General appearance: alert, cooperative, appears stated age, no distress and mildly obese  Lungs: Bilateral basal coarse crackles, left worse than the right.  Chest wall: no tenderness, right chest infraclavicular area has a small open wound. Cardiac exam: S1 and S2 is normal, there is no gallop or murmur appreciated. No rub.  Abdomen soft, nontender, no organomegaly. No significant ascites evident by physical exam.  Neck: No JVD. No hepatojugular reflux. No thyromegaly.  Extremities: 3+ edema with oozing in the left leg at the vein harvest site. No e/o infection and wound packing looks healthy.  2- 3+ edema right leg No obvious signs of infection.  Lab Results: BMP  Recent Labs  12/27/13 0332 12/28/13 0318 12/29/13 0340  NA 126* 127* 128*  K DELTA CHECK NOTED 4.0 3.8  CL 85* 88* 87*  CO2 27 27 28   GLUCOSE 38* 169* 160*  BUN 32* 36* 37*  CREATININE 2.27* 2.44* 2.42*  CALCIUM 8.7 8.1* 8.1*  GFRNONAA 19* 18* 18*  GFRAA 22* 21* 21*    CBC  Recent Labs Lab 12/24/13 0335  12/28/13 0318  WBC 8.0  < > 8.4  RBC 3.43*  < > 3.71*  HGB 9.7*  < > 9.8*  HCT 29.7*  < > 30.4*  PLT 234  < > 220   MCV 86.6  < > 81.9  MCH 28.3  < > 26.4  MCHC 32.7  < > 32.2  RDW 15.6*  < > 15.6*  LYMPHSABS 0.6*  --   --   MONOABS 0.7  --   --   EOSABS 0.1  --   --   BASOSABS 0.0  --   --   < > = values in this interval not displayed.  HEMOGLOBIN A1C Lab Results  Component Value Date   HGBA1C 7.2* 11/06/2013   MPG 160* 11/06/2013    Cardiac Panel (last 3 results)  Recent Labs  06/26/13 0539 06/26/13 1520 06/27/13 0523 11/06/13 1125  CKTOTAL 318* 561* 311*  --   CKMB 24.8* 37.1* 11.6*  --   TROPONINI 2.91* 14.02* 7.84* 0.66*  RELINDX 7.8* 6.6* 3.7*  --     BNP (last 3 results)  Recent Labs  12/27/13 0500 12/28/13 0318 12/29/13 0340  PROBNP 19503.0* 14328.0* 10783.0*    TSH  Recent Labs  12/24/13 0335  TSH 1.610    CHOLESTEROL No results found for this basename: CHOL,  in the last 8760 hours  Hepatic Function Panel  Recent Labs  11/09/13 0345  12/06/13 0909 12/23/13 1843 12/24/13 0335  PROT 4.5*  < > 6.4 6.6 6.0  ALBUMIN 3.1*  < > 2.8* 2.7* 2.6*  AST 78*  < >  15 14 13   ALT 19  < > 11 16 14   ALKPHOS 31*  < > 77 103 102  BILITOT 1.6*  < > 0.5 0.5 0.4  BILIDIR 0.4*  --   --   --   --   IBILI 1.2*  --   --   --   --   < > = values in this interval not displayed.  Scheduled Meds: . citalopram  20 mg Oral Daily  . demeclocycline  150 mg Oral QID  . feeding supplement (GLUCERNA SHAKE)  237 mL Oral TID WC  . fluticasone  1 spray Each Nare Daily  . furosemide  80 mg Intravenous 4 times per day  . insulin aspart  0-9 Units Subcutaneous TID WC  . insulin glargine  15 Units Subcutaneous QHS  . metoprolol tartrate  25 mg Oral BID  . metroNIDAZOLE  500 mg Oral 3 times per day  . nystatin cream   Topical TID  . pantoprazole  40 mg Oral Daily  . simvastatin  20 mg Oral QPM  . sodium chloride  3 mL Intravenous Q12H  . sodium chloride  3 mL Intravenous Q12H  . tolvaptan  15 mg Oral Q24H  . Warfarin - Pharmacist Dosing Inpatient   Does not apply q1800   Continuous  Infusions:  PRN Meds:.acetaminophen, acetaminophen, ondansetron (ZOFRAN) IV, ondansetron, temazepam  Assessment/Plan:  1. Fluid overload state with 3+ leg edema, bilateral, improving, however she has significant amount of fluid leaking from her vein harvest site still. 2. Chronic renal failure, stage III chronic kidney disease, stable serum creatinine.  3. CAD of native coronary vessels, status post CABG with LIMA to LAD and SVG to OM along with replacement of mitral valve with a 25 mm magna mitral Ease bioprosthetic valve on 11/05/2013. Echocardiogram 12/06/2013 revealing preserved LVEF, no obvious vegetation of the mitral valve, no pericardial effusion.  4. Acute on chronic diastolic heart failure  5. Hyponatremia 6. Diabetes mellitus type 2 uncontrolled with renal manifestation  7. Hypoalbuminemia  8. Anemia of chronic disease  9. Failure to thrive in an adult  Rec: I had started her on Demeclocycline, but hyponatremia persists. I will try Samska for diuresis and d/w Pharmacy. Not sure she is ready to be discharged yet.  Laverda Page, M.D. 12/29/2013, 12:56 PM [facility] Pager: [phone] Office: [phone] If no answer: [phone]

## 2013-12-29 NOTE — Progress Notes (Signed)
[place] for Coumadin Indication: atrial fibrillation   Recent Labs  12/27/13 0332 12/28/13 0318 12/29/13 0340  HGB  --  9.8*  --   HCT  --  30.4*  --   PLT  --  220  --   LABPROT 18.5* 22.2* 21.9*  INR 1.53* 1.93* 1.90*  CREATININE 2.27* 2.44* 2.42*    Estimated Creatinine Clearance: 21.4 ml/min (by C-G formula based on Cr of 2.42).   Assessment: 80 yo female admitted with CHF exacerbation, h/o Afib, continue on Coumadin, INR 1.9. CBC stable  PTA dose: 3 mg daily per nursing home MAR  Goal of Therapy:  INR 2-3 Monitor platelets by anticoagulation protocol: Yes   Plan:  Coumadin 3.5 mg po daily Daily INR Monitor CBC, s/sx of bleeding.    Thanks for allowing pharmacy to be a part of this patient's care.  Excell Seltzer, PharmD Clinical Pharmacist, [phone] 12/29/2013 1:17 PM

## 2013-12-30 LAB — GLUCOSE, CAPILLARY
GLUCOSE-CAPILLARY: 211 mg/dL — AB (ref 70–99)
Glucose-Capillary: 92 mg/dL (ref 70–99)
Glucose-Capillary: 153 mg/dL — ABNORMAL HIGH (ref 70–99)
Glucose-Capillary: 154 mg/dL — ABNORMAL HIGH (ref 70–99)

## 2013-12-30 LAB — BASIC METABOLIC PANEL
Anion gap: 14 (ref 5–15)
BUN: 35 mg/dL — ABNORMAL HIGH (ref 6–23)
CHLORIDE: 89 meq/L — AB (ref 96–112)
CO2: 29 meq/L (ref 19–32)
CREATININE: 2.23 mg/dL — AB (ref 0.50–1.10)
Calcium: 8.4 mg/dL (ref 8.4–10.5)
GFR calc Af Amer: 23 mL/min — ABNORMAL LOW (ref >90)
GFR calc non Af Amer: 20 mL/min — ABNORMAL LOW (ref >90)
Glucose, Bld: 99 mg/dL (ref 70–99)
POTASSIUM: 3.7 meq/L (ref 3.7–5.3)
Sodium: 132 mEq/L — ABNORMAL LOW (ref 137–147)

## 2013-12-30 LAB — CBC
HCT: 32.6 % — ABNORMAL LOW (ref 36.0–46.0)
Hemoglobin: 10.3 g/dL — ABNORMAL LOW (ref 12.0–15.0)
MCH: 26.1 pg (ref 26.0–34.0)
MCHC: 31.6 g/dL (ref 30.0–36.0)
MCV: 82.7 fL (ref 78.0–100.0)
PLATELETS: 247 10*3/uL (ref 150–400)
RBC: 3.94 MIL/uL (ref 3.87–5.11)
RDW: 15.3 % (ref 11.5–15.5)
WBC: 6.6 10*3/uL (ref 4.0–10.5)

## 2013-12-30 LAB — PROTIME-INR
INR: 1.88 — AB (ref 0.00–1.49)
Prothrombin Time: 21.8 seconds — ABNORMAL HIGH (ref 11.6–15.2)

## 2013-12-30 LAB — PRO B NATRIURETIC PEPTIDE: PRO B NATRI PEPTIDE: 12014 pg/mL — AB (ref 0–450)

## 2013-12-30 MED ORDER — WARFARIN SODIUM 4 MG PO TABS
4.0000 mg | ORAL_TABLET | Freq: Once | ORAL | Status: AC
  Administered 2013-12-30: 4 mg via ORAL
  Filled 2013-12-30: qty 1

## 2013-12-30 NOTE — Clinical Social Work Psychosocial (Signed)
Clinical Social Work Department BRIEF PSYCHOSOCIAL ASSESSMENT 12/30/2013  Patient:  Claudia [name], Claudia [name]     Account Number:  [id number]     Admit date:  12/23/2013  Clinical Social Worker:  Dian Queen  Date/Time:  12/30/2013 03:00 PM  Referred by:  Physician  Date Referred:  12/27/2013 Referred for  SNF Placement   Other Referral:   Interview type:  Family Other interview type:    PSYCHOSOCIAL DATA Living Status:  FACILITY Admitted from facility:  [facility] Level of care:  [place] Primary support name:  Gwendoline Judy Primary support relationship to patient:  FAMILY Degree of support available:   Son is primary support when patient is not in facility and lives with patient    CURRENT CONCERNS Current Concerns  Post-Acute Placement   Other Concerns:    SOCIAL WORK ASSESSMENT / PLAN Patient is a 80 year old female who was at SNF for rehab. Patient has a son who is quite involved in patient's care. Patient's son would like to look for a different placement for patient, but is okay with her returning to the same facility if no other option.  Patient's son states she lives with him and someone is usually there to help take care of patient.  Patient plans to return to a SNf to continue rehab, and would like to move to a different facility if possible once patient is medically ready and discharge orders have been received from physician.   Assessment/plan status:   Other assessment/ plan:   Information/referral to community resources:    PATIENT'S/FAMILY'S RESPONSE TO PLAN OF CARE: Patient and family are in agreement with plan of care    Jones Broom. [place], MSW, [place] 12/30/2013 11:28 PM

## 2013-12-30 NOTE — Progress Notes (Signed)
Physical Therapy Treatment Patient Details Name: Claudia [name] MRN: [id number] DOB: 1931-07-18 Today's Date: 12/30/2013    History of Present Illness Pt admitted with fluid overload and generalized weakness with failure to thrive. Was recently admitted for CABG x2 and MVR on 11/09/2013. Hx of HTN, MI, DM.    PT Comments    Patient is progressing towards physical therapy goals, able to ambulate up to 10 feet today with min assist. Focused on transfer training, safety with mobility, therapeutic exercises, and gait training. Continues to require cues to maintain sternal precautions. Patient will continue to benefit from skilled physical therapy services to further improve independence with functional mobility.   Follow Up Recommendations  SNF     Equipment Recommendations  None recommended by PT    Recommendations for Other Services       Precautions / Restrictions Precautions Precautions: Fall;Sternal Restrictions Weight Bearing Restrictions: Yes Other Position/Activity Restrictions: sternal precautions reviewed    Mobility  Bed Mobility                  Transfers Overall transfer level: Needs assistance Equipment used: Rolling walker (2 wheeled) Transfers: Sit to/from Stand Sit to Stand: Min assist         General transfer comment: Min assist for boost to stand. Max VC and tactile facilitation for anterior weight shift for mechanical advantage to stand from reclining chair. VC for hand placement over chest to maintain sternal precautions, also required to sit safely with poor control. Required 2 attempts.  Ambulation/Gait Ambulation/Gait assistance: Min assist Ambulation Distance (Feet): 10 Feet Assistive device: Rolling walker (2 wheeled) Gait Pattern/deviations: Step-through pattern;Decreased step length - left;Decreased stance time - left;Shuffle;Decreased stride length;Decreased dorsiflexion - left;Antalgic;Leaning posteriorly   Gait velocity  interpretation: Below normal speed for age/gender General Gait Details: Educated on safe DME use with a rolling walker. VC for decreased weight through UEs to maintain sternal precautions. Pt rushes towards end of bout to sit due to fatigue. VC for safety and to take her time prior to sitting.   Stairs            Wheelchair Mobility    Modified Rankin (Stroke Patients Only)       Balance                                    Cognition Arousal/Alertness: Awake/alert Behavior During Therapy: WFL for tasks assessed/performed Overall Cognitive Status: Within Functional Limits for tasks assessed                      Exercises General Exercises - Lower Extremity Ankle Circles/Pumps: AROM;Both;Supine;20 reps Long Arc Quad: Strengthening;Both;10 reps;Seated Hip Flexion/Marching: Strengthening;Both;10 reps;Seated    General Comments General comments (skin integrity, edema, etc.): SpO2 maintained 98% on room air during therapy session with HR reaching mid 80s. LLE with edema, LEs elevated at end of therapy session.      Pertinent Vitals/Pain Pain Assessment: No/denies pain Pain Intervention(s): Monitored during session    Home Living                      Prior Function            PT Goals (current goals can now be found in the care plan section) Acute Rehab PT Goals PT Goal Formulation: With patient Time For Goal Achievement: 01/02/14 Potential to Achieve Goals: Fair Progress towards  PT goals: Progressing toward goals    Frequency  Min 2X/week    PT Plan Current plan remains appropriate    Co-evaluation             End of Session Equipment Utilized During Treatment: Gait belt Activity Tolerance: Patient tolerated treatment well Patient left: in chair;with call bell/phone within reach;with family/visitor present     Time: 1346-1411 PT Time Calculation (min): 25 min  Charges:  $Therapeutic Activity: 23-37 mins                     G Codes:      Elayne Snare, [place]  Ellouise Newer 12/30/2013, 2:37 PM

## 2013-12-30 NOTE — Progress Notes (Signed)
[place] for Coumadin Indication: atrial fibrillation   Recent Labs  12/28/13 0318 12/29/13 0340 12/30/13 0255  HGB 9.8*  --  10.3*  HCT 30.4*  --  32.6*  PLT 220  --  247  LABPROT 22.2* 21.9* 21.8*  INR 1.93* 1.90* 1.88*  CREATININE 2.44* 2.42* 2.23*    Estimated Creatinine Clearance: 23.2 ml/min (by C-G formula based on Cr of 2.23).   Assessment: 80 yo female admitted with CHF exacerbation, h/o Afib, continue on Coumadin, INR continues to fluctuate, down to 1.88 today. CBC stable  PTA dose: 3 mg daily per nursing home MAR  Goal of Therapy:  INR 2-3 Monitor platelets by anticoagulation protocol: Yes   Plan:  Coumadin 4 mg po x1 Daily INR Monitor CBC, s/sx of bleeding    Hughes Better, PharmD, BCPS Clinical Pharmacist Pager: [phone] 12/30/2013 10:38 AM

## 2013-12-30 NOTE — Progress Notes (Signed)
Patient's son want pt to be placed at [facility], discussed with Randall Hiss Education officer, museum. Sherrie Mustache 12:51 PM

## 2013-12-30 NOTE — Progress Notes (Signed)
Subjective:  Patient feels much better, leg edema is improving.  Patient being treated for C. difficile diarrhea which is now resolved, leg edema still persistent but much improved. No other complaints today. Overall patient states that she's feeling better and her strength is improved and wants to be discharged.  Objective:  Vital Signs in the last 24 hours: Temp:  [97.6 F (36.4 C)-98.5 F (36.9 C)] 97.6 F (36.4 C) (10/26 1422) Pulse Rate:  [62-68] 64 (10/26 1422) Resp:  [17-19] 19 (10/26 1422) BP: (130-137)/(39-53) 137/53 mmHg (10/26 1422) SpO2:  [97 %-98 %] 97 % (10/26 1422) Weight:  [86.8 kg (191 lb 5.8 oz)] 86.8 kg (191 lb 5.8 oz) (10/26 0339)  Intake/Output from previous day: 10/25 0701 - 10/26 0700 In: 480 [P.O.:480] Out: 3250 [Urine:3250]  Physical Exam: General appearance: alert, cooperative, appears stated age, no distress and mildly obese  Lungs: Bilateral basal coarse crackles, left worse than the right.  Chest wall: no tenderness, right chest infraclavicular area has a small open wound. Cardiac exam: S1 and S2 is normal, there is no gallop or murmur appreciated. No rub.  Abdomen soft, nontender, no organomegaly. No significant ascites evident by physical exam.  Neck: No JVD. No hepatojugular reflux. No thyromegaly.  Extremities: 2+ edema with oozing in the left leg at the vein harvest site. No e/o infection and wound packing looks healthy.  2+ edema right leg No obvious signs of infection.  Lab Results: BMP  Recent Labs  12/28/13 0318 12/29/13 0340 12/29/13 1246 12/29/13 1910 12/30/13 0255  NA 127* 128* 127* 129* 132*  K 4.0 3.8  --   --  3.7  CL 88* 87*  --   --  89*  CO2 27 28  --   --  29  GLUCOSE 169* 160*  --   --  99  BUN 36* 37*  --   --  35*  CREATININE 2.44* 2.42*  --   --  2.23*  CALCIUM 8.1* 8.1*  --   --  8.4  GFRNONAA 18* 18*  --   --  20*  GFRAA 21* 21*  --   --  23*    CBC  Recent Labs Lab 12/24/13 0335  12/30/13 0255  WBC 8.0  <  > 6.6  RBC 3.43*  < > 3.94  HGB 9.7*  < > 10.3*  HCT 29.7*  < > 32.6*  PLT 234  < > 247  MCV 86.6  < > 82.7  MCH 28.3  < > 26.1  MCHC 32.7  < > 31.6  RDW 15.6*  < > 15.3  LYMPHSABS 0.6*  --   --   MONOABS 0.7  --   --   EOSABS 0.1  --   --   BASOSABS 0.0  --   --   < > = values in this interval not displayed.  HEMOGLOBIN A1C Lab Results  Component Value Date   HGBA1C 7.2* 11/06/2013   MPG 160* 11/06/2013    Cardiac Panel (last 3 results)  Recent Labs  06/26/13 0539 06/26/13 1520 06/27/13 0523 11/06/13 1125  CKTOTAL 318* 561* 311*  --   CKMB 24.8* 37.1* 11.6*  --   TROPONINI 2.91* 14.02* 7.84* 0.66*  RELINDX 7.8* 6.6* 3.7*  --     BNP (last 3 results)  Recent Labs  12/28/13 0318 12/29/13 0340 12/30/13 0255  PROBNP 14328.0* 10783.0* 12014.0*    TSH  Recent Labs  12/24/13 0335  TSH 1.610  CHOLESTEROL No results found for this basename: CHOL,  in the last 8760 hours  Hepatic Function Panel  Recent Labs  11/09/13 0345  12/06/13 0909 12/23/13 1843 12/24/13 0335  PROT 4.5*  < > 6.4 6.6 6.0  ALBUMIN 3.1*  < > 2.8* 2.7* 2.6*  AST 78*  < > 15 14 13   ALT 19  < > 11 16 14   ALKPHOS 31*  < > 77 103 102  BILITOT 1.6*  < > 0.5 0.5 0.4  BILIDIR 0.4*  --   --   --   --   IBILI 1.2*  --   --   --   --   < > = values in this interval not displayed.  Scheduled Meds: . citalopram  20 mg Oral Daily  . demeclocycline  150 mg Oral QID  . feeding supplement (GLUCERNA SHAKE)  237 mL Oral TID WC  . fluticasone  1 spray Each Nare Daily  . furosemide  80 mg Intravenous 4 times per day  . insulin aspart  0-9 Units Subcutaneous TID WC  . insulin glargine  15 Units Subcutaneous QHS  . metoprolol tartrate  25 mg Oral BID  . metroNIDAZOLE  500 mg Oral 3 times per day  . nystatin cream   Topical TID  . pantoprazole  40 mg Oral Daily  . simvastatin  20 mg Oral QPM  . sodium chloride  3 mL Intravenous Q12H  . sodium chloride  3 mL Intravenous Q12H  . tolvaptan  15 mg  Oral Q24H  . warfarin  4 mg Oral ONCE-1800  . Warfarin - Pharmacist Dosing Inpatient   Does not apply q1800   Continuous Infusions:  PRN Meds:.acetaminophen, acetaminophen, ondansetron (ZOFRAN) IV, ondansetron, temazepam  Assessment/Plan:  1. Fluid overload state  2. Chronic renal failure, stage III chronic kidney disease, stable serum creatinine.  3. CAD of native coronary vessels, status post CABG with LIMA to LAD and SVG to OM along with replacement of mitral valve with a 25 mm magna mitral Ease bioprosthetic valve on 11/05/2013. Echocardiogram 12/06/2013 revealing preserved LVEF, no obvious vegetation of the mitral valve, no pericardial effusion.  4. Acute on chronic diastolic heart failure  5. Hyponatremia now improved after one dose of Samska. S. Cr stable 6. Diabetes mellitus type 2 uncontrolled with renal manifestation  7. Hypoalbuminemia  8. Anemia of chronic disease  9. Failure to thrive in an adult  Rec: Will discontinue Samska after this dose. Discontinue Lasix and use demadex. Use support stocking as her leg edema has improved markedly. Probably discharge tomorrow. Ambulate.  Laverda Page, M.D. 12/30/2013, 3:33 PM [facility] Pager: [phone] Office: [phone] If no answer: [phone]

## 2013-12-30 NOTE — Progress Notes (Addendum)
Chart reviewed.   PROGRESS NOTE  Claudia [name] KVQ:[id number] DOB: 17-Dec-1931 DOA: 12/23/2013 PCP: Odette Fraction, MD  Claudia [name] is a 80 y.o. female with history of CAD who had recent CABG last month and mitral valve replacement with pericardial tissue valve, chronic kidney disease stage 3-4, diabetes mellitus was referred to the ER patient was found to have increasing lower extremity edema. Patient denies any shortness of breath or chest pain but does complain of weakness. Patient is on Lasix 80 mg by mouth daily despite which patient has been found to be increasingly gaining weight and lower extremity edema. Patient recently had CABG and mitral valve replacement which was complicated by bacteremia metabolic encephalopathy and also had fluid overload at the time.   Assessment/Plan: Fluid overload -  Much improved with IV lasix and samsca. Change to demadex per cards. Last dose samsca today. D/c foley in am -2-D echo done was 2 weeks ago which showed EF of 50-55%.  -Dopplers of the lower extremity no DVT  c diff diarrhea Improved on flagyl. Would treat for 10 days total. D/c PPI  CAD status post CABG and mitral valve replacement with tissue valve recently last month   Atrial fibrillation presently in sinus rhythm - continue Coumadin per pharmacy.  Diabetes mellitus type 2 - on insulin. Decrease lantus due to hypoglycemia.  Chronic kidney disease stage 4 - stable  Chronic anemia - follow CBC.  Hyperlipidemia - continue statins.  Generalized weakness/deconditioning. SNF tomorrow.  Serratia Marcesans Bacteremia during recent admission and had 15 days of antibiotic therapy.   Hyponatremia Improved after samsca  Code Status: full Family Communication: patient Disposition Plan: SNF Tuesday if stable   Consultants:  Ganji/cards  TCTS  Procedures:      HPI/Subjective: No dyspnea. Edema resolved. No diarrhea or vomiting  Objective: Filed Vitals:   12/30/13  1422  BP: 137/53  Pulse: 64  Temp: 97.6 F (36.4 C)  Resp: 19    Intake/Output Summary (Last 24 hours) at 12/30/13 1616 Last data filed at 12/30/13 1422  Gross per 24 hour  Intake    720 ml  Output   3900 ml  Net  -3180 ml   Filed Weights   12/28/13 0406 12/29/13 0625 12/30/13 0339  Weight: 87.635 kg (193 lb 3.2 oz) 86.955 kg (191 lb 11.2 oz) 86.8 kg (191 lb 5.8 oz)    Exam:   General:  A+Ox3, NAD  Cardiovascular: rrr without MGR  Respiratory: clear, no wheezing rhonchi or rales  Abdomen: +BS, soft, obese, nontender  Musculoskeletal: right leg without edema. Left leg kerlex dry  Data Reviewed: Basic Metabolic Panel:  Recent Labs Lab 12/26/13 0504 12/27/13 0332 12/28/13 0318 12/29/13 0340 12/29/13 1246 12/29/13 1910 12/30/13 0255  NA 127* 126* 127* 128* 127* 129* 132*  K 4.1 DELTA CHECK NOTED 4.0 3.8  --   --  3.7  CL 88* 85* 88* 87*  --   --  89*  CO2 26 27 27 28   --   --  29  GLUCOSE 114* 38* 169* 160*  --   --  99  BUN 32* 32* 36* 37*  --   --  35*  CREATININE 2.22* 2.27* 2.44* 2.42*  --   --  2.23*  CALCIUM 8.5 8.7 8.1* 8.1*  --   --  8.4   Liver Function Tests:  Recent Labs Lab 12/23/13 1843 12/24/13 0335  AST 14 13  ALT 16 14  ALKPHOS 103 102  BILITOT 0.5  0.4  PROT 6.6 6.0  ALBUMIN 2.7* 2.6*   No results found for this basename: LIPASE, AMYLASE,  in the last 168 hours No results found for this basename: AMMONIA,  in the last 168 hours CBC:  Recent Labs Lab 12/23/13 1843 12/24/13 0335 12/26/13 0504 12/28/13 0318 12/30/13 0255  WBC 10.4 8.0 16.8* 8.4 6.6  NEUTROABS  --  6.6  --   --   --   HGB 10.4* 9.7* 10.3* 9.8* 10.3*  HCT 31.8* 29.7* 31.3* 30.4* 32.6*  MCV 83.0 86.6 82.4 81.9 82.7  PLT 226 234 250 220 247   Cardiac Enzymes: No results found for this basename: CKTOTAL, CKMB, CKMBINDEX, TROPONINI,  in the last 168 hours BNP (last 3 results)  Recent Labs  12/28/13 0318 12/29/13 0340 12/30/13 0255  PROBNP 14328.0*  10783.0* 12014.0*   CBG:  Recent Labs Lab 12/29/13 1121 12/29/13 1608 12/29/13 2127 12/30/13 0624 12/30/13 1120  GLUCAP 223* 168* 149* 92 211*    Recent Results (from the past 240 hour(s))  CLOSTRIDIUM DIFFICILE BY PCR     Status: Abnormal   Collection Time    12/26/13  1:13 PM      Result Value Ref Range Status   C difficile by pcr POSITIVE (*) NEGATIVE Final   Comment: CRITICAL RESULT CALLED TO, READ BACK BY AND VERIFIED WITH:     DAVIS RN 14:15 12/26/13 ([email or web address])     Studies: No results found.  Scheduled Meds: . citalopram  20 mg Oral Daily  . demeclocycline  150 mg Oral QID  . feeding supplement (GLUCERNA SHAKE)  237 mL Oral TID WC  . fluticasone  1 spray Each Nare Daily  . furosemide  80 mg Intravenous 4 times per day  . insulin aspart  0-9 Units Subcutaneous TID WC  . insulin glargine  15 Units Subcutaneous QHS  . metoprolol tartrate  25 mg Oral BID  . metroNIDAZOLE  500 mg Oral 3 times per day  . nystatin cream   Topical TID  . pantoprazole  40 mg Oral Daily  . simvastatin  20 mg Oral QPM  . sodium chloride  3 mL Intravenous Q12H  . sodium chloride  3 mL Intravenous Q12H  . warfarin  4 mg Oral ONCE-1800  . Warfarin - Pharmacist Dosing Inpatient   Does not apply q1800   Continuous Infusions:  Antibiotics Given (last 72 hours)   Date/Time Action Medication Dose   12/27/13 2144 Given   metroNIDAZOLE (FLAGYL) tablet 500 mg 500 mg   12/28/13 0553 Given   metroNIDAZOLE (FLAGYL) tablet 500 mg 500 mg   12/28/13 1439 Given   metroNIDAZOLE (FLAGYL) tablet 500 mg 500 mg   12/28/13 1439 Given   demeclocycline (DECLOMYCIN) tablet 150 mg 150 mg   12/28/13 1724 Given   demeclocycline (DECLOMYCIN) tablet 150 mg 150 mg   12/28/13 2202 Given   demeclocycline (DECLOMYCIN) tablet 150 mg 150 mg   12/28/13 2203 Given   metroNIDAZOLE (FLAGYL) tablet 500 mg 500 mg   12/29/13 5102 Given   metroNIDAZOLE (FLAGYL) tablet 500 mg 500 mg   12/29/13 1122 Given   demeclocycline  (DECLOMYCIN) tablet 150 mg 150 mg   12/29/13 1525 Given   metroNIDAZOLE (FLAGYL) tablet 500 mg 500 mg   12/29/13 1525 Given   demeclocycline (DECLOMYCIN) tablet 150 mg 150 mg   12/29/13 1809 Given   demeclocycline (DECLOMYCIN) tablet 150 mg 150 mg   12/29/13 2157 Given   demeclocycline (DECLOMYCIN) tablet 150 mg 150 mg  12/29/13 2158 Given   metroNIDAZOLE (FLAGYL) tablet 500 mg 500 mg   12/30/13 0536 Given   metroNIDAZOLE (FLAGYL) tablet 500 mg 500 mg   12/30/13 1055 Given   demeclocycline (DECLOMYCIN) tablet 150 mg 150 mg   12/30/13 1423 Given   metroNIDAZOLE (FLAGYL) tablet 500 mg 500 mg   12/30/13 1423 Given   demeclocycline (DECLOMYCIN) tablet 150 mg 150 mg     Time spent: [facility] Pager [phone]/26/2015, 4:16 PM  LOS: 7 days

## 2013-12-31 LAB — GLUCOSE, CAPILLARY
Glucose-Capillary: 123 mg/dL — ABNORMAL HIGH (ref 70–99)
Glucose-Capillary: 216 mg/dL — ABNORMAL HIGH (ref 70–99)

## 2013-12-31 LAB — PROTIME-INR
INR: 2.11 — AB (ref 0.00–1.49)
PROTHROMBIN TIME: 23.8 s — AB (ref 11.6–15.2)

## 2013-12-31 MED ORDER — WARFARIN SODIUM 4 MG PO TABS
4.0000 mg | ORAL_TABLET | Freq: Once | ORAL | Status: DC
  Filled 2013-12-31: qty 1

## 2013-12-31 MED ORDER — FLUTICASONE PROPIONATE 50 MCG/ACT NA SUSP: 1.0000 | Freq: Every day | NASAL | Status: DC

## 2013-12-31 MED ORDER — METOPROLOL TARTRATE 25 MG PO TABS: 25.0000 mg | ORAL_TABLET | Freq: Two times a day (BID) | ORAL | Status: DC

## 2013-12-31 MED ORDER — METRONIDAZOLE 500 MG PO TABS: 500.0000 mg | ORAL_TABLET | Freq: Three times a day (TID) | ORAL | Status: DC

## 2013-12-31 MED ORDER — WARFARIN SODIUM 4 MG PO TABS: 4.0000 mg | ORAL_TABLET | Freq: Every day | ORAL | Status: DC

## 2013-12-31 MED ORDER — TEMAZEPAM 7.5 MG PO CAPS: 7.5000 mg | ORAL_CAPSULE | Freq: Every evening | ORAL | Status: DC | PRN

## 2013-12-31 MED ORDER — TORSEMIDE 20 MG PO TABS
20.0000 mg | ORAL_TABLET | Freq: Two times a day (BID) | ORAL | Status: DC
  Administered 2013-12-31: 20 mg via ORAL
  Filled 2013-12-31 (×2): qty 1

## 2013-12-31 MED ORDER — TORSEMIDE 20 MG PO TABS: 20.0000 mg | ORAL_TABLET | Freq: Two times a day (BID) | ORAL | Status: DC

## 2013-12-31 MED ORDER — DEMECLOCYCLINE HCL 150 MG PO TABS: 150.0000 mg | ORAL_TABLET | Freq: Four times a day (QID) | ORAL | Status: DC

## 2013-12-31 NOTE — Discharge Instructions (Signed)
Clostridium Difficile Infection °Clostridium difficile (C. difficile) is a bacteria found in the intestinal tract or colon. Under certain conditions, it causes diarrhea and sometimes severe disease. The severe form of the disease is known as pseudomembranous colitis (often called C. difficile colitis). This disease can damage the lining of the colon or cause the colon to become enlarged (toxic megacolon). °CAUSES °Your colon normally contains many different bacteria, including C. difficile. The balance of bacteria in your colon can change during illness. This is especially true when you take antibiotic medicine. Taking antibiotics may allow the C. difficile to grow, multiply excessively, and make a toxin that then causes illness. The elderly and people with certain medical conditions have a greater risk of getting C. difficile infections. °SYMPTOMS °· Watery diarrhea. °· Fever. °· Fatigue. °· Loss of appetite. °· Nausea. °· Abdominal swelling, pain, or tenderness. °· Dehydration. °DIAGNOSIS °Your symptoms may make your caregiver suspect a C. difficile infection, especially if you have used antibiotics in the preceding weeks. However, there are only 2 ways to know for certain whether you have a C. difficile infection: °· A lab test that finds the toxin in your stool. °· The specific appearance of an abnormality (pseudomembrane) in your colon. This can only be seen by doing a sigmoidoscopy or colonoscopy. These procedures involve passing an instrument through your rectum to look at the inside of your colon. °Your caregiver will help determine if these tests are necessary. °TREATMENT °· Most people are successfully treated with one of two specific antibiotics, usually given by mouth. Other antibiotics you are receiving are stopped if possible. °· Intravenous (IV) fluids and correction of electrolyte imbalance may be necessary. °· Rarely, surgery may be needed to remove the infected part of the intestines. °· Careful  hand washing by you and your caregivers is important to prevent the spread of infection. In the hospital, your caregivers may also put on gowns and gloves to prevent the spread of the C. difficile bacteria. Your room is also cleaned regularly with a solution containing bleach or a product that is known to kill C. difficile. °HOME CARE INSTRUCTIONS °· Drink enough fluids to keep your urine clear or pale yellow. Avoid milk, caffeine, and alcohol. °· Ask your caregiver for specific rehydration instructions. °· Try eating small, frequent meals rather than large meals. °· Take your antibiotics as directed. Finish them even if you start to feel better. °· Do not use medicines to slow diarrhea. This could delay healing or cause complications. °· Wash your hands thoroughly after using the bathroom and before preparing food. °· Make sure people who live with you wash their hands often, too. °· Carefully disinfect all surfaces with a product that contains chlorine bleach. °SEEK MEDICAL CARE IF: °· Diarrhea persists longer than expected or recurs after completing your course of antibiotic treatment for the C. difficile infection. °· You have trouble staying hydrated. °SEEK IMMEDIATE MEDICAL CARE IF: °· You develop a new fever. °· You have increasing abdominal pain or tenderness. °· There is blood in your stools, or your stools are dark black and tarry. °· You cannot hold down food or liquids. °MAKE SURE YOU: °· Understand these instructions. °· Will watch your condition. °· Will get help right away if you are not doing well or get worse. °Document Released: 12/01/2004 Document Revised: 07/08/2013 Document Reviewed: 07/30/2010 °Exit[employer]® Patient Information ©2015 [employer]. This information is not intended to replace advice given to you by your health care provider. Make sure you   discuss any questions you have with your health care provider. ° °

## 2013-12-31 NOTE — Progress Notes (Signed)
      [address]       [place] [address]             [phone]          Subjective:  feels pretty good, wants to return to snf for further rehab  Objective: Vital signs in last 24 hours: Temp:  [97.6 F (36.4 C)-98.3 F (36.8 C)] 98.2 F (36.8 C) (10/27 0643) Pulse Rate:  [62-68] 68 (10/27 0643) Cardiac Rhythm:  [-] Normal sinus rhythm (10/26 2000) Resp:  [18-19] 18 (10/27 0643) BP: (118-144)/(47-57) 144/57 mmHg (10/27 0643) SpO2:  [97 %-99 %] 99 % (10/27 0643) Weight:  [189 lb (85.73 kg)] 189 lb (85.73 kg) (10/27 0034)  Hemodynamic parameters for last 24 hours:    Intake/Output from previous day: 10/26 0701 - 10/27 0700 In: 720 [P.O.:720] Out: 2950 [Urine:2950] Intake/Output this shift:    General appearance: alert, cooperative and no distress Wound: superficial < 1cm skin dehiscence at left ankle incis- no cellulitis, nin serous drainage  Lab Results:  Recent Labs  12/30/13 0255  WBC 6.6  HGB 10.3*  HCT 32.6*  PLT 247   BMET:  Recent Labs  12/29/13 0340  12/29/13 1910 12/30/13 0255  NA 128*  < > 129* 132*  K 3.8  --   --  3.7  CL 87*  --   --  89*  CO2 28  --   --  29  GLUCOSE 160*  --   --  99  BUN 37*  --   --  35*  CREATININE 2.42*  --   --  2.23*  CALCIUM 8.1*  --   --  8.4  < > = values in this interval not displayed.  PT/INR:  Recent Labs  12/31/13 0532  LABPROT 23.8*  INR 2.11*   ABG    Component Value Date/Time   PHART 7.459* 12/06/2013 1000   HCO3 25.3* 12/06/2013 1000   TCO2 26 12/06/2013 1000   ACIDBASEDEF 7.0* 11/11/2013 1642   O2SAT 74.5 12/06/2013 1455   CBG (last 3)   Recent Labs  12/30/13 1655 12/30/13 2041 12/31/13 0641  GLUCAP 153* 154* 123*    Meds Scheduled Meds: . citalopram  20 mg Oral Daily  . demeclocycline  150 mg Oral QID  . feeding supplement (GLUCERNA SHAKE)  237 mL Oral TID WC  . fluticasone  1 spray Each Nare Daily  . furosemide  80 mg Intravenous 4 times per day  . insulin  aspart  0-9 Units Subcutaneous TID WC  . insulin glargine  15 Units Subcutaneous QHS  . metoprolol tartrate  25 mg Oral BID  . metroNIDAZOLE  500 mg Oral 3 times per day  . nystatin cream   Topical TID  . simvastatin  20 mg Oral QPM  . sodium chloride  3 mL Intravenous Q12H  . sodium chloride  3 mL Intravenous Q12H  . Warfarin - Pharmacist Dosing Inpatient   Does not apply q1800   Continuous Infusions:  PRN Meds:.acetaminophen, acetaminophen, ondansetron (ZOFRAN) IV, ondansetron, temazepam  Xrays No results found.  Assessment/Plan: Stable drom surgical viewpoint for SNF when medically ready  Per primary   LOS: 8 days    GOLD,WAYNE E 12/31/2013

## 2013-12-31 NOTE — Progress Notes (Signed)
MD perform daily dressing change to left lower leg per pt, new dressing noted. pt refused dressing change to right subclavian d/t discharging. Sherrie Mustache 12:17 PM

## 2013-12-31 NOTE — Progress Notes (Signed)
Pt discharged to SNF Discharge instructions given to EMS Eduction discussed  IV dc'd  Tele dc'd  Pt discharged via ambulance, all pt belongs at side.  Sherrie Mustache 4:11 PM

## 2013-12-31 NOTE — Clinical Social Work Psychosocial (Signed)
Clinical Social Work Department BRIEF PSYCHOSOCIAL ASSESSMENT 12/31/2013  Patient:  Siri Cole     Account Number:  [id number]     Admit date:  12/28/2013  Clinical Social Worker:  Dian Queen  Date/Time:  12/31/2013 12:00 N  Referred by:  Physician  Date Referred:  12/31/2013 Referred for  ALF Placement   Other Referral:   Interview type:  Patient Other interview type:    PSYCHOSOCIAL DATA Living Status:  FACILITY Admitted from facility:  [facility] Level of care:  Assisted Living Primary support name:   Primary support relationship to patient:   Degree of support available:    CURRENT CONCERNS Current Concerns  Post-Acute Placement   Other Concerns:    SOCIAL WORK ASSESSMENT / PLAN Patient is a 80 year old female who was from [facility].  Patient was oriented x3, and talkative.  Patient stated she was ready to get back to [facility]. Patient will discharge once orders are received and patient is medically ready.   Assessment/plan status:   Other assessment/ plan:   Information/referral to community resources:    PATIENT'S/FAMILY'S RESPONSE TO PLAN OF CARE: Patient in agreement to plan to return back to ALF.   Jones Broom. [place], MSW, [place] 12/31/2013 5:26 PM

## 2013-12-31 NOTE — Discharge Summary (Signed)
Physician Discharge Summary  Claudia [name] YWV:[id number] DOB: 1931-07-24 DOA: 12/23/2013  PCP: Odette Fraction, MD  Admit date: 12/23/2013 Discharge date: 12/31/2013  Recommendations for Outpatient Follow-up:  1. Pt will need to follow up with PCP in 2 weeks post discharge 2. Please obtain BMP to evaluate electrolytes and kidney function 3. Please also check CBC to evaluate Hg and Hct levels 4. Please note that Lasix was discontinued and pt was placed on Torsemide 20 mg PO BID regimen 5. Pt made aware of need to follow up with cardiologist 6. Please also note that pt was discharged on Coumadin 4 mg PO QD, please check PT/INR and adjust the regimen as indicated  7. Pt discharged on Metronidazole to complete therapy for 9 more days, please note PPI discontinued until therapy completed 8. Pt discharge on Demeclocycline to complete therapy for 11 more days post discharge   Discharge Diagnoses: Acute on chronic diastolic CHF  Principal Problem:   Volume overload Active Problems:   DM (diabetes mellitus), type 2   Hypercholesterolemia   CHF (congestive heart failure)   S/P CABG x 2   S/P mitral valve replacement with bioprosthetic valve   CKD (chronic kidney disease) stage 4, GFR 15-29 ml/min   Fluid overload  Discharge Condition: Stable  Diet recommendation: Heart healthy diet discussed in details   History of present illness:  80 y.o. female with CAD, recent CABG last month and mitral valve replacement with pericardial tissue valve, chronic kidney disease stage 3-4, diabetes mellitus was referred to the ER for evaluation of ncreasing lower extremity edema. Patient denied any shortness of breath or chest pain. Patient is on Lasix 80 mg by mouth daily despite which patient has been found to be increasingly gaining weight with worsening lower extremity edema. Patient recently had CABG and mitral valve replacement which was complicated by bacteremia, metabolic encephalopathy, and  also had fluid overload.   Hospital Course:  Principal Problem:   Acute on chronic diastolic CHF  - much improved clinically - per cardiology, furosemide discontinued and placed on Torsemide 20 mg BID PO - 2-D echo 2 weeks ago showed EF of 50-55%.  - Dopplers of the lower extremity no DVT  - weight on discharge 189 lbs Active Problems:   DM (diabetes mellitus), type 2 - continue Lantus along with SSI upon discharge    Hypercholesterolemia - continue statin    S/P CABG x 2, S/P mitral valve replacement with bioprosthetic valve - continue Coumadin and ensure to check PT/INR Oct 28th, 2015 and readjust the dose of Coumadin as indicate    CKD (chronic kidney disease) stage 4, GFR 15-29 ml/min - needs close follow up as pt on Torsemide    Diarrhea secondary to C. Diff - continue Flagyl for 9 more days post discharge    Atrial fibrillation presently in sinus rhythm  - continue Coumadin   Chronic anemia  - follow CBC.    Serratia Marcesans Bacteremia during recent admission  - continue demeclocycline as noted below   Procedures/Studies: Dg Chest 2 View  12/23/2013   No convincing acute cardiopulmonary disease. 2. Left lower lobe opacity is likely chronic atelectasis/scarring related to prior cardiac surgery. No convincing infiltrate. 3. Small persistent bilateral pleural effusions.   Ct Head Wo Contrast  12/06/2013   1. Progressive diffuse loss of gray-white differentiation over the cerebral convexities bilaterally. This raises concern for diffuse hypoxia. If the patient remains unresponsive, nuclear medicine cerebral fusion exam may be useful for further evaluation. 2.  The basal ganglia are preserved. 3. No evidence for hemorrhage. 4. Hypoattenuation on the left likely represents partial volume averaging versus infarct along the inferior parietal lobe.  Ct Head Wo Contrast  12/04/2013   1. Focal hypoattenuation along the inferior left parietal lobe may be partial volume averaging along the  tentorium. A developing infarct is also considered. 2. Otherwise stable atrophy and white matter disease.  Dg Chest Port 1 View  12/10/2013   Findings felt to represent a degree of congestive heart failure. Bilateral lower lobe atelectatic change. Central catheter as described. No pneumothorax.    Dg Chest Port 1 View  12/07/2013   Overall worsened aeration, with increased interstitial edema and new or increased bilateral pleural effusions.  Bibasilar Airspace disease, likely atelectasis.   Dg Chest Port 1 View  12/06/2013   Overall appearance is stable with bibasilar atelectatic changes.    Consultations:  Cardiology Dr. Lanice Shirts  Discharge Exam: Filed Vitals:   12/31/13 0643  BP: 144/57  Pulse: 68  Temp: 98.2 F (36.8 C)  Resp: 18   Filed Vitals:   12/30/13 1055 12/30/13 1422 12/30/13 2003 12/31/13 0643  BP:  137/53 118/47 144/57  Pulse: 68 64 62 68  Temp:  97.6 F (36.4 C) 98.3 F (36.8 C) 98.2 F (36.8 C)  TempSrc:  Oral Oral Oral  Resp:  19 18 18   Height:      Weight:    85.73 kg (189 lb)  SpO2:  97% 98% 99%    General: Pt is alert, follows commands appropriately, not in acute distress Cardiovascular: Irregular rate and rhythm, no rubs, no gallops Respiratory: Clear to auscultation bilaterally, no wheezing, no crackles, no rhonchi Abdominal: Soft, non tender, non distended, bowel sounds +, no guarding Extremities: no cyanosis, pulses palpable bilaterally DP and PT Neuro: Grossly nonfocal  Discharge Instructions  Discharge Instructions   Diet - low sodium heart healthy    Complete by:  As directed      Increase activity slowly    Complete by:  As directed             Medication List    STOP taking these medications       furosemide 80 MG tablet  Commonly known as:  LASIX     pantoprazole 40 MG tablet  Commonly known as:  PROTONIX     ROCEPHIN 1 G injection  Generic drug:  cefTRIAXone      TAKE these medications       ACCU-CHEK NANO SMARTVIEW  W/DEVICE Kit  Pt needs monitor, strips (box of 400/ 4 refill), lancets (box of 400/4 refills) She checks her BS qid (before meals & QHS) Dx: 250.00     acetaminophen 500 MG tablet  Commonly known as:  TYLENOL  Take 1,000 mg by mouth daily as needed (pain).     acetaminophen 500 MG tablet  Commonly known as:  TYLENOL  Take 1,000 mg by mouth every 6 (six) hours as needed.     B-D ULTRAFINE III SHORT PEN 31G X 8 MM Misc  Generic drug:  Insulin Pen Needle  USE AS DIRECTED WITH LANTUS PEN     citalopram 20 MG tablet  Commonly known as:  CELEXA  Take 1 tablet (20 mg total) by mouth daily.     demeclocycline 150 MG tablet  Commonly known as:  DECLOMYCIN  Take 1 tablet (150 mg total) by mouth 4 (four) times daily.     feeding supplement (GLUCERNA  SHAKE) Liqd  Take 237 mLs by mouth 3 (three) times daily with meals.     fluticasone 50 MCG/ACT nasal spray  Commonly known as:  FLONASE  Place 1 spray into both nostrils daily.     insulin aspart 100 UNIT/ML FlexPen  Commonly known as:  NOVOLOG FLEXPEN  - Inject 5-10 Units into the skin 3 (three) times daily with meals. 70-120=0    251-300=5  - 121-150=1  301-350=7  - 151-200=2  351-400=9  - 201-250=3  400 or >, call MD  -  units of insulin     insulin glargine 100 UNIT/ML injection  Commonly known as:  LANTUS  Inject 0.2 mLs (20 Units total) into the skin 2 (two) times daily.     Melatonin 3 MG Tabs  Take 3 mg by mouth at bedtime as needed (insomnia).     metoprolol tartrate 25 MG tablet  Commonly known as:  LOPRESSOR  Take 1 tablet (25 mg total) by mouth 2 (two) times daily.     metroNIDAZOLE 500 MG tablet  Commonly known as:  FLAGYL  Take 1 tablet (500 mg total) by mouth every 8 (eight) hours.     nystatin cream  Commonly known as:  MYCOSTATIN  Apply topically 3 (three) times daily.     simvastatin 20 MG tablet  Commonly known as:  ZOCOR  Take 1 tablet (20 mg total) by mouth every evening.     temazepam 7.5 MG  capsule  Commonly known as:  RESTORIL  Take 1 capsule (7.5 mg total) by mouth at bedtime as needed for sleep.     torsemide 20 MG tablet  Commonly known as:  DEMADEX  Take 1 tablet (20 mg total) by mouth 2 (two) times daily.     warfarin 4 MG tablet  Commonly known as:  COUMADIN  Take 1 tablet (4 mg total) by mouth daily. Take 4 mg tablet today, check PT/INR in AM October 28th, 2015 and readjust the dose as indicated            Follow-up Information   Follow up with Odette Fraction, MD.   Specialty:  Family Medicine   Contact information:   [address] [place] [address] [phone]        The results of significant diagnostics from this hospitalization (including imaging, microbiology, ancillary and laboratory) are listed below for reference.     Microbiology: Recent Results (from the past 240 hour(s))  CLOSTRIDIUM DIFFICILE BY PCR     Status: Abnormal   Collection Time    12/26/13  1:13 PM      Result Value Ref Range Status   C difficile by pcr POSITIVE (*) NEGATIVE Final   Comment: CRITICAL RESULT CALLED TO, READ BACK BY AND VERIFIED WITH:     DAVIS RN 14:15 12/26/13 ([email or web address])     Labs: Basic Metabolic Panel:  Recent Labs Lab 12/26/13 0504 12/27/13 0332 12/28/13 0318 12/29/13 0340 12/29/13 1246 12/29/13 1910 12/30/13 0255  NA 127* 126* 127* 128* 127* 129* 132*  K 4.1 DELTA CHECK NOTED 4.0 3.8  --   --  3.7  CL 88* 85* 88* 87*  --   --  89*  CO2 26 27 27 28   --   --  29  GLUCOSE 114* 38* 169* 160*  --   --  99  BUN 32* 32* 36* 37*  --   --  35*  CREATININE 2.22* 2.27* 2.44* 2.42*  --   --  2.23*  CALCIUM 8.5 8.7 8.1* 8.1*  --   --  8.4   CBC:  Recent Labs Lab 12/26/13 0504 12/28/13 0318 12/30/13 0255  WBC 16.8* 8.4 6.6  HGB 10.3* 9.8* 10.3*  HCT 31.3* 30.4* 32.6*  MCV 82.4 81.9 82.7  PLT 250 220 247   BNP (last 3 results)  Recent Labs  12/28/13 0318 12/29/13 0340 12/30/13 0255  PROBNP 14328.0* 10783.0* 12014.0*    CBG:  Recent Labs Lab 12/30/13 0624 12/30/13 1120 12/30/13 1655 12/30/13 2041 12/31/13 0641  GLUCAP 92 211* 153* 154* 123*   SIGNED: Time coordinating discharge: Over 30 minutes  Faye Ramsay, MD  [facility] 12/31/2013, 8:55 AM Pager [phone]  If 7PM-7AM, please contact night-coverage [email or web address] Password [email or web address]

## 2013-12-31 NOTE — Progress Notes (Signed)
ANTICOAGULATION CONSULT NOTE - Follow Up Consult  Pharmacy Consult for Coumadin Indication: atrial fibrillation  Allergies  Allergen Reactions  . Aspirin Other (See Comments)    High doses caused stomach bleeds    Patient Measurements: Height: 5\' 7"  (170.2 cm) Weight: 189 lb (85.73 kg) IBW/kg (Calculated) : 61.6 Heparin Dosing Weight:   Vital Signs: Temp: 98.2 F (36.8 C) (10/27 0643) Temp Source: Oral (10/27 0643) BP: 144/57 mmHg (10/27 0643) Pulse Rate: 68 (10/27 0643)  Labs:  Recent Labs  12/29/13 0340 12/30/13 0255 12/31/13 0532  HGB  --  10.3*  --   HCT  --  32.6*  --   PLT  --  247  --   LABPROT 21.9* 21.8* 23.8*  INR 1.90* 1.88* 2.11*  CREATININE 2.42* 2.23*  --     Estimated Creatinine Clearance: 23 ml/min (by C-G formula based on Cr of 2.23).   Medications:  Scheduled:  . citalopram  20 mg Oral Daily  . demeclocycline  150 mg Oral QID  . feeding supplement (GLUCERNA SHAKE)  237 mL Oral TID WC  . fluticasone  1 spray Each Nare Daily  . insulin aspart  0-9 Units Subcutaneous TID WC  . insulin glargine  15 Units Subcutaneous QHS  . metoprolol tartrate  25 mg Oral BID  . metroNIDAZOLE  500 mg Oral 3 times per day  . nystatin cream   Topical TID  . simvastatin  20 mg Oral QPM  . sodium chloride  3 mL Intravenous Q12H  . sodium chloride  3 mL Intravenous Q12H  . torsemide  20 mg Oral BID  . Warfarin - Pharmacist Dosing Inpatient   Does not apply q1800    Goal of Therapy:  INR 2-3 Monitor platelets by anticoagulation protocol: Yes  Assessment/Plan: 80yo female with AFib.  INR 2.11 this AM, Hg & pltc good on 10/26.  No bleeding problems noted.  Notified by MD that pt is being discharged today.  Recommended Coumadin 4mg  daily and f/u INR this week.   Gracy Bruins, PharmD Clinical Pharmacist [facility]

## 2013-12-31 NOTE — Clinical Social Work Note (Signed)
Patient to be d/c'ed today to [facility] in [place].  Patient and family agreeable to plans will transport via ems RN to call report.  Evette Cristal, MSW, [place]

## 2014-01-07 ENCOUNTER — Other Ambulatory Visit: Payer: Self-pay | Admitting: Cardiothoracic Surgery

## 2014-01-07 DIAGNOSIS — Z951 Presence of aortocoronary bypass graft: Secondary | ICD-10-CM

## 2014-01-08 ENCOUNTER — Encounter: Payer: Self-pay | Admitting: Cardiothoracic Surgery

## 2014-01-08 ENCOUNTER — Ambulatory Visit
Admission: RE | Admit: 2014-01-08 | Discharge: 2014-01-08 | Disposition: A | Discharge status: Home / Self Care | Payer: Commercial Managed Care - HMO | Source: Ambulatory Visit | Attending: Cardiothoracic Surgery | Admitting: Cardiothoracic Surgery

## 2014-01-08 ENCOUNTER — Ambulatory Visit (INDEPENDENT_AMBULATORY_CARE_PROVIDER_SITE_OTHER): Payer: Self-pay | Admitting: Cardiothoracic Surgery

## 2014-01-08 VITALS — BP 148/68 | HR 66 | Resp 20 | Ht 67.0 in | Wt 189.0 lb

## 2014-01-08 DIAGNOSIS — Z951 Presence of aortocoronary bypass graft: Secondary | ICD-10-CM

## 2014-01-08 DIAGNOSIS — Z952 Presence of prosthetic heart valve: Secondary | ICD-10-CM

## 2014-01-08 DIAGNOSIS — Z954 Presence of other heart-valve replacement: Secondary | ICD-10-CM

## 2014-01-08 NOTE — Progress Notes (Signed)
PCP is Odette Fraction, MD Referring Provider is Laverda Page, MD  Chief Complaint  Patient presents with  . Routine Post Op    4 week f/u with CXR S/P CABG x 2    TDH:RCBULA visit for previous combined CABG with mitral valve replacement using a tissue valve 4 months ago. The patient is in a nursing facility in [place]. Today she presents in a wheelchair but states she is angulating somewhat physical therapy. The chest surgical incisions are all well-healed. She is maintained sinus rhythm and chest x-ray today shows no effusions or CHF. She has bilateral lower leg edema from venous insufficiency and then open area above the left ankle where open harvest the vein was attempted. There is no surrounding cellulitis.   Past Medical History  Diagnosis Date  . GERD (gastroesophageal reflux disease)   . PUD (peptic ulcer disease)   . Anemia   . Hypertension   . S/P endoscopy Aug 2011    3 superficial gastric ulcers, NSAID-induced  . S/P colonoscopy Sept 2011    left-sided diverticula, tubular adenoma  . Coronary artery disease   . Shortness of breath   . Diabetes mellitus     insulin dependent  . Headache(784.0)     rare migraines  . Cancer     hx of skin cancer  . Arthritis   . Osteopenia   . Hypercholesterolemia   . SIADH (syndrome of inappropriate ADH production)   . CHF (congestive heart failure)   . Myocardial infarction 2013    Past Surgical History  Procedure Laterality Date  . Tonsillectomy    . Appendectomy    . Eye surgery      cataracts  . Cardiac stents  07/19/2011  . Esophagogastroduodenoscopy  10/16/09    normal without barrett's/three superficial gastric ulcers  . Colonoscopy  12/04/09    normal rectum/left-sided diverticula  . Joint replacement  arthroscopy to knee  . Toe amputation Left 2013    left second toe amputation  . Cardiac catheterization  01/22/2013  . Coronary angioplasty  07/2011  . Coronary artery bypass graft N/A 11/08/2013     Procedure: CORONARY ARTERY BYPASS GRAFTING (CABG), on pump, times two, using left internal mammary artery, cryo saphenous vein.;  Surgeon: Ivin Poot, MD;  Location: [facility];  Service: Open Heart Surgery;  Laterality: N/A;  LIMA-LAD CRYOVEIN -OM  . Intraoperative transesophageal echocardiogram N/A 11/08/2013    Procedure: INTRAOPERATIVE TRANSESOPHAGEAL ECHOCARDIOGRAM;  Surgeon: Ivin Poot, MD;  Location: [facility];  Service: Open Heart Surgery;  Laterality: N/A;  . Mitral valve replacement N/A 11/08/2013    Procedure: MITRAL VALVE (MV) REPLACEMENT;  Surgeon: Ivin Poot, MD;  Location: [facility];  Service: Open Heart Surgery;  Laterality: N/A;  #25 MAGNA MITRAL EASE    No family history on file.  Social History History  Substance Use Topics  . Smoking status: Never Smoker   . Smokeless tobacco: Never Used  . Alcohol Use: No    Current Outpatient Prescriptions  Medication Sig Dispense Refill  . acetaminophen (TYLENOL) 500 MG tablet Take 1,000 mg by mouth every 6 (six) hours as needed.    . B-D ULTRAFINE III SHORT PEN 31G X 8 MM MISC USE AS DIRECTED WITH LANTUS PEN 100 each 5  . Blood Glucose Monitoring Suppl (ACCU-CHEK NANO SMARTVIEW) W/DEVICE KIT Pt needs monitor, strips (box of 400/ 4 refill), lancets (box of 400/4 refills) She checks her BS qid (before meals & QHS) Dx: 250.00 1  kit 0  . demeclocycline (DECLOMYCIN) 150 MG tablet Take 1 tablet (150 mg total) by mouth 4 (four) times daily. 44 tablet 0  . feeding supplement, GLUCERNA SHAKE, (GLUCERNA SHAKE) LIQD Take 237 mLs by mouth 3 (three) times daily with meals.  0  . fluticasone (FLONASE) 50 MCG/ACT nasal spray Place 1 spray into both nostrils daily. 16 g 2  . furosemide (LASIX) 40 MG tablet Take 40 mg by mouth daily. 40 mg in am 80 mg in pm    . insulin aspart (NOVOLOG FLEXPEN) 100 UNIT/ML FlexPen Inject 5-10 Units into the skin 3 (three) times daily with meals. 70-120=0    251-300=5 121-150=1  301-350=7 151-200=2   351-400=9 201-250=3  400 or >, call MD  units of insulin 15 mL 11  . insulin glargine (LANTUS) 100 UNIT/ML injection Inject 0.2 mLs (20 Units total) into the skin 2 (two) times daily. 10 mL 11  . Melatonin 3 MG TABS Take 3 mg by mouth at bedtime as needed (insomnia).    . metoprolol tartrate (LOPRESSOR) 25 MG tablet Take 1 tablet (25 mg total) by mouth 2 (two) times daily. (Patient taking differently: Take 12.5 mg by mouth 2 (two) times daily. )    . nystatin cream (MYCOSTATIN) Apply topically 3 (three) times daily. 30 g 0  . simvastatin (ZOCOR) 20 MG tablet Take 1 tablet (20 mg total) by mouth every evening. 90 tablet 1  . temazepam (RESTORIL) 7.5 MG capsule Take 1 capsule (7.5 mg total) by mouth at bedtime as needed for sleep. 30 capsule 0  . warfarin (COUMADIN) 4 MG tablet Take 1 tablet (4 mg total) by mouth daily. Take 4 mg tablet today, check PT/INR in AM October 28th, 2015 and readjust the dose as indicated 30 tablet 0  . metroNIDAZOLE (FLAGYL) 500 MG tablet Take 1 tablet (500 mg total) by mouth every 8 (eight) hours. 27 tablet 0   No current facility-administered medications for this visit.    Allergies  Allergen Reactions  . Aspirin Other (See Comments)    High doses caused stomach bleeds    Review of Systems  patient enjoys the skilled nursing facility she is living at currently.  BP 148/68 mmHg  Pulse 66  Resp 20  Ht 5' 7" (1.702 m)  Wt 189 lb (85.73 kg)  BMI 29.59 kg/m2  SpO2 99% Physical Exam Alert and appropriate Lungs clear Heart regular without murmur Sternum well-healed Proximal leg incision is healed from vein harvest Distal left leg incision still open with subcutaneous tissue being packed with iodoform gauze once daily  Diagnostic Tests: Chest x-ray clear  Impression: Slow recovery after combined CABG mitral valve replacement Diabetes, obesity, hypertension Postop atrial fibrillation now sinus rhythm on amiodarone and Coumadin Nonhealing left distal leg  incision above the ankle  Plan:continue packing the left lower leg incision without of warm daily. Return for followup in 3-4 weeks

## 2014-01-21 ENCOUNTER — Telehealth: Payer: Self-pay | Admitting: *Deleted

## 2014-01-21 NOTE — Telephone Encounter (Signed)
Submitted [employer] referral thru acuity connect for authorization to [employer] with authorization number [id number] for Dr. Justin Mend, will fax paper copy once receive.

## 2014-01-29 ENCOUNTER — Encounter: Payer: Self-pay | Admitting: Cardiothoracic Surgery

## 2014-01-29 ENCOUNTER — Ambulatory Visit (INDEPENDENT_AMBULATORY_CARE_PROVIDER_SITE_OTHER): Payer: Self-pay | Admitting: Cardiothoracic Surgery

## 2014-01-29 VITALS — BP 155/72 | HR 75 | Resp 18 | Ht 65.0 in | Wt 188.0 lb

## 2014-01-29 DIAGNOSIS — I2510 Atherosclerotic heart disease of native coronary artery without angina pectoris: Secondary | ICD-10-CM

## 2014-01-29 NOTE — Progress Notes (Signed)
PCP is Odette Fraction, MD Referring Provider is Laverda Page, MD  Chief Complaint  Patient presents with  . Routine Post Op    3 WK F/U ON LEG INCISION, POST CABG ON 11/08/13    HPI:2 month postop followup after combined CABG and mitral valve replacement for acute MI and ischemic MR. Patient still in a nursing facility at [place]. She has maintained sinus rhythm over 2 months and is off amiodarone. We will stop her Coumadin. She is a fall risk.  She is ambulating short distances now with a rolling walker and the assistance of physical therapy  She has some baseline lower extremity edema controlled with Lasix. She has a surgical wound above the left ankle where the saphenous vein was explored which is still granulating and is requiring wound packing saline wet-to-dry. This is clean without purulence or cellulitis.  She is off oxygen and her lungs are clear-- last chest x-ray was clear   Past Medical History  Diagnosis Date  . GERD (gastroesophageal reflux disease)   . PUD (peptic ulcer disease)   . Anemia   . Hypertension   . S/P endoscopy Aug 2011    3 superficial gastric ulcers, NSAID-induced  . S/P colonoscopy Sept 2011    left-sided diverticula, tubular adenoma  . Coronary artery disease   . Shortness of breath   . Diabetes mellitus     insulin dependent  . Headache(784.0)     rare migraines  . Cancer     hx of skin cancer  . Arthritis   . Osteopenia   . Hypercholesterolemia   . SIADH (syndrome of inappropriate ADH production)   . CHF (congestive heart failure)   . Myocardial infarction 2013    Past Surgical History  Procedure Laterality Date  . Tonsillectomy    . Appendectomy    . Eye surgery      cataracts  . Cardiac stents  07/19/2011  . Esophagogastroduodenoscopy  10/16/09    normal without barrett's/three superficial gastric ulcers  . Colonoscopy  12/04/09    normal rectum/left-sided diverticula  . Joint replacement  arthroscopy to knee  . Toe  amputation Left 2013    left second toe amputation  . Cardiac catheterization  01/22/2013  . Coronary angioplasty  07/2011  . Coronary artery bypass graft N/A 11/08/2013    Procedure: CORONARY ARTERY BYPASS GRAFTING (CABG), on pump, times two, using left internal mammary artery, cryo saphenous vein.;  Surgeon: Ivin Poot, MD;  Location: [place] [facility];  Service: Open Heart Surgery;  Laterality: N/A;  LIMA-LAD CRYOVEIN -OM  . Intraoperative transesophageal echocardiogram N/A 11/08/2013    Procedure: INTRAOPERATIVE TRANSESOPHAGEAL ECHOCARDIOGRAM;  Surgeon: Ivin Poot, MD;  Location: [facility];  Service: Open Heart Surgery;  Laterality: N/A;  . Mitral valve replacement N/A 11/08/2013    Procedure: MITRAL VALVE (MV) REPLACEMENT;  Surgeon: Ivin Poot, MD;  Location: [place];  Service: Open Heart Surgery;  Laterality: N/A;  #25 MAGNA MITRAL EASE    No family history on file.  Social History History  Substance Use Topics  . Smoking status: Never Smoker   . Smokeless tobacco: Never Used  . Alcohol Use: No    Current Outpatient Prescriptions  Medication Sig Dispense Refill  . acetaminophen (TYLENOL) 500 MG tablet Take 1,000 mg by mouth every 6 (six) hours as needed.    . B-D ULTRAFINE III SHORT PEN 31G X 8 MM MISC USE AS DIRECTED WITH LANTUS PEN 100 each 5  . Blood  Glucose Monitoring Suppl (ACCU-CHEK NANO SMARTVIEW) W/DEVICE KIT Pt needs monitor, strips (box of 400/ 4 refill), lancets (box of 400/4 refills) She checks her BS qid (before meals & QHS) Dx: 250.00 1 kit 0  . citalopram (CELEXA) 20 MG tablet Take 20 mg by mouth daily.    Marland Kitchen demeclocycline (DECLOMYCIN) 150 MG tablet Take 1 tablet (150 mg total) by mouth 4 (four) times daily. 44 tablet 0  . feeding supplement, GLUCERNA SHAKE, (GLUCERNA SHAKE) LIQD Take 237 mLs by mouth 3 (three) times daily with meals.  0  . fluticasone (FLONASE) 50 MCG/ACT nasal spray Place 1 spray into both nostrils daily. 16 g 2  . furosemide (LASIX) 40 MG tablet Take 40  mg by mouth daily. 40 mg in am 80 mg in pm    . insulin aspart (NOVOLOG FLEXPEN) 100 UNIT/ML FlexPen Inject 5-10 Units into the skin 3 (three) times daily with meals. 70-120=0    251-300=5 121-150=1  301-350=7 151-200=2  351-400=9 201-250=3  400 or >, call MD  units of insulin 15 mL 11  . insulin glargine (LANTUS) 100 UNIT/ML injection Inject 0.2 mLs (20 Units total) into the skin 2 (two) times daily. 10 mL 11  . Melatonin 3 MG TABS Take 3 mg by mouth at bedtime as needed (insomnia).    . metoprolol tartrate (LOPRESSOR) 25 MG tablet Take 1 tablet (25 mg total) by mouth 2 (two) times daily. (Patient taking differently: Take 12.5 mg by mouth 2 (two) times daily. )    . metroNIDAZOLE (FLAGYL) 500 MG tablet Take 1 tablet (500 mg total) by mouth every 8 (eight) hours. 27 tablet 0  . nystatin cream (MYCOSTATIN) Apply topically 3 (three) times daily. 30 g 0  . simvastatin (ZOCOR) 20 MG tablet Take 1 tablet (20 mg total) by mouth every evening. 90 tablet 1  . temazepam (RESTORIL) 7.5 MG capsule Take 1 capsule (7.5 mg total) by mouth at bedtime as needed for sleep. 30 capsule 0  . warfarin (COUMADIN) 4 MG tablet Take 1 tablet (4 mg total) by mouth daily. Take 4 mg tablet today, check PT/INR in AM October 28th, 2015 and readjust the dose as indicated (Patient taking differently: Take 2 mg by mouth daily. Take 4 mg tablet today, check PT/INR in AM October 28th, 2015 and readjust the dose as indicated) 30 tablet 0   No current facility-administered medications for this visit.    Allergies  Allergen Reactions  . Aspirin Other (See Comments)    High doses caused stomach bleeds    Review of Systems overall improved strength and appetite is last visit. Still not strong enough to go home with assistance by her family.    BP 155/72 mmHg  Pulse 75  Resp 18  Ht 5' 5"  (1.651 m)  Wt 188 lb (85.276 kg)  BMI 31.28 kg/m2  SpO2 99% Physical Exam Alert and appropriate Lungs clear Sternal incision  well-healed Heart rhythm regular without murmur Left lower leg incision clean with some fat necrosis removed repacked with saline wet-to-dry 2 x 2 gauze  Diagnostic Tests: Labs from nursing home revealed last macro 31, last creatinine 1.5  Impression: Continue progress after emergency CABG and mitral valve replacement  Plan:continue nursing home therapy, physical therapy Stop Coumadin Continue other medications including metoprolol, Lasix, Crestor and her insulin regimen  Return for wound check and review in one month

## 2014-02-13 ENCOUNTER — Encounter (HOSPITAL_COMMUNITY): Payer: Self-pay | Admitting: Cardiology

## 2014-02-23 ENCOUNTER — Encounter (HOSPITAL_COMMUNITY): Payer: Self-pay | Admitting: Emergency Medicine

## 2014-02-23 ENCOUNTER — Inpatient Hospital Stay (HOSPITAL_COMMUNITY)
Admission: EM | Admit: 2014-02-23 | Discharge: 2014-02-25 | DRG: 069 | Disposition: A | Discharge status: Skilled Nursing Facility | Payer: Commercial Managed Care - HMO | Attending: Internal Medicine | Admitting: Internal Medicine

## 2014-02-23 ENCOUNTER — Emergency Department (HOSPITAL_COMMUNITY): Payer: Commercial Managed Care - HMO

## 2014-02-23 DIAGNOSIS — D649 Anemia, unspecified: Secondary | ICD-10-CM | POA: Diagnosis present

## 2014-02-23 DIAGNOSIS — Z794 Long term (current) use of insulin: Secondary | ICD-10-CM

## 2014-02-23 DIAGNOSIS — G459 Transient cerebral ischemic attack, unspecified: Principal | ICD-10-CM | POA: Diagnosis present

## 2014-02-23 DIAGNOSIS — R4182 Altered mental status, unspecified: Secondary | ICD-10-CM | POA: Diagnosis present

## 2014-02-23 DIAGNOSIS — M1990 Unspecified osteoarthritis, unspecified site: Secondary | ICD-10-CM | POA: Diagnosis present

## 2014-02-23 DIAGNOSIS — Z7901 Long term (current) use of anticoagulants: Secondary | ICD-10-CM

## 2014-02-23 DIAGNOSIS — E119 Type 2 diabetes mellitus without complications: Secondary | ICD-10-CM

## 2014-02-23 DIAGNOSIS — Z951 Presence of aortocoronary bypass graft: Secondary | ICD-10-CM

## 2014-02-23 DIAGNOSIS — R471 Dysarthria and anarthria: Secondary | ICD-10-CM

## 2014-02-23 DIAGNOSIS — Z955 Presence of coronary angioplasty implant and graft: Secondary | ICD-10-CM

## 2014-02-23 DIAGNOSIS — I252 Old myocardial infarction: Secondary | ICD-10-CM

## 2014-02-23 DIAGNOSIS — I509 Heart failure, unspecified: Secondary | ICD-10-CM | POA: Diagnosis present

## 2014-02-23 DIAGNOSIS — Z8711 Personal history of peptic ulcer disease: Secondary | ICD-10-CM

## 2014-02-23 DIAGNOSIS — Z886 Allergy status to analgesic agent status: Secondary | ICD-10-CM

## 2014-02-23 DIAGNOSIS — Z953 Presence of xenogenic heart valve: Secondary | ICD-10-CM

## 2014-02-23 DIAGNOSIS — E871 Hypo-osmolality and hyponatremia: Secondary | ICD-10-CM | POA: Diagnosis present

## 2014-02-23 DIAGNOSIS — I10 Essential (primary) hypertension: Secondary | ICD-10-CM | POA: Diagnosis present

## 2014-02-23 DIAGNOSIS — I2510 Atherosclerotic heart disease of native coronary artery without angina pectoris: Secondary | ICD-10-CM | POA: Diagnosis present

## 2014-02-23 DIAGNOSIS — I480 Paroxysmal atrial fibrillation: Secondary | ICD-10-CM | POA: Diagnosis present

## 2014-02-23 DIAGNOSIS — I129 Hypertensive chronic kidney disease with stage 1 through stage 4 chronic kidney disease, or unspecified chronic kidney disease: Secondary | ICD-10-CM | POA: Diagnosis present

## 2014-02-23 DIAGNOSIS — N184 Chronic kidney disease, stage 4 (severe): Secondary | ICD-10-CM | POA: Diagnosis present

## 2014-02-23 DIAGNOSIS — M8580 Other specified disorders of bone density and structure, unspecified site: Secondary | ICD-10-CM | POA: Diagnosis present

## 2014-02-23 DIAGNOSIS — R4701 Aphasia: Secondary | ICD-10-CM | POA: Diagnosis not present

## 2014-02-23 DIAGNOSIS — N390 Urinary tract infection, site not specified: Secondary | ICD-10-CM | POA: Diagnosis present

## 2014-02-23 DIAGNOSIS — I4891 Unspecified atrial fibrillation: Secondary | ICD-10-CM | POA: Diagnosis present

## 2014-02-23 DIAGNOSIS — K219 Gastro-esophageal reflux disease without esophagitis: Secondary | ICD-10-CM | POA: Diagnosis present

## 2014-02-23 DIAGNOSIS — G3184 Mild cognitive impairment, so stated: Secondary | ICD-10-CM | POA: Diagnosis present

## 2014-02-23 DIAGNOSIS — E780 Pure hypercholesterolemia: Secondary | ICD-10-CM | POA: Diagnosis present

## 2014-02-23 LAB — COMPREHENSIVE METABOLIC PANEL
ALT: 9 U/L (ref 0–35)
AST: 12 U/L (ref 0–37)
Albumin: 2.9 g/dL — ABNORMAL LOW (ref 3.5–5.2)
Alkaline Phosphatase: 100 U/L (ref 39–117)
Anion gap: 16 — ABNORMAL HIGH (ref 5–15)
BUN: 24 mg/dL — ABNORMAL HIGH (ref 6–23)
CALCIUM: 9.1 mg/dL (ref 8.4–10.5)
CO2: 26 meq/L (ref 19–32)
Chloride: 89 mEq/L — ABNORMAL LOW (ref 96–112)
Creatinine, Ser: 1.43 mg/dL — ABNORMAL HIGH (ref 0.50–1.10)
GFR calc Af Amer: 39 mL/min — ABNORMAL LOW (ref >90)
GFR calc non Af Amer: 34 mL/min — ABNORMAL LOW (ref >90)
Glucose, Bld: 252 mg/dL — ABNORMAL HIGH (ref 70–99)
Potassium: 3.8 mEq/L (ref 3.7–5.3)
SODIUM: 131 meq/L — AB (ref 137–147)
TOTAL PROTEIN: 6.9 g/dL (ref 6.0–8.3)
Total Bilirubin: 0.9 mg/dL (ref 0.3–1.2)

## 2014-02-23 LAB — TROPONIN I: Troponin I: <0.3 ng/mL (ref <0.30)

## 2014-02-23 LAB — CBC WITH DIFFERENTIAL/PLATELET
BASOS ABS: 0 10*3/uL (ref 0.0–0.1)
Basophils Relative: 0 % (ref 0–1)
EOS PCT: 1 % (ref 0–5)
Eosinophils Absolute: 0.1 10*3/uL (ref 0.0–0.7)
HCT: 32 % — ABNORMAL LOW (ref 36.0–46.0)
Hemoglobin: 10.5 g/dL — ABNORMAL LOW (ref 12.0–15.0)
LYMPHS ABS: 0.9 10*3/uL (ref 0.7–4.0)
LYMPHS PCT: 12 % (ref 12–46)
MCH: 27.7 pg (ref 26.0–34.0)
MCHC: 32.8 g/dL (ref 30.0–36.0)
MCV: 84.4 fL (ref 78.0–100.0)
Monocytes Absolute: 0.5 10*3/uL (ref 0.1–1.0)
Monocytes Relative: 6 % (ref 3–12)
NEUTROS PCT: 81 % — AB (ref 43–77)
Neutro Abs: 6.2 10*3/uL (ref 1.7–7.7)
PLATELETS: 167 10*3/uL (ref 150–400)
RBC: 3.79 MIL/uL — AB (ref 3.87–5.11)
RDW: 18 % — ABNORMAL HIGH (ref 11.5–15.5)
WBC: 7.7 10*3/uL (ref 4.0–10.5)

## 2014-02-23 LAB — PROTIME-INR
INR: 1.1 (ref 0.00–1.49)
Prothrombin Time: 14.3 seconds (ref 11.6–15.2)

## 2014-02-23 LAB — URINALYSIS, ROUTINE W REFLEX MICROSCOPIC
Bilirubin Urine: NEGATIVE
Glucose, UA: >1000 mg/dL — AB
Ketones, ur: NEGATIVE mg/dL
LEUKOCYTES UA: NEGATIVE
NITRITE: POSITIVE — AB
PH: 6 (ref 5.0–8.0)
SPECIFIC GRAVITY, URINE: 1.01 (ref 1.005–1.030)
Urobilinogen, UA: 0.2 mg/dL (ref 0.0–1.0)

## 2014-02-23 LAB — URINE MICROSCOPIC-ADD ON

## 2014-02-23 LAB — GLUCOSE, CAPILLARY: Glucose-Capillary: 206 mg/dL — ABNORMAL HIGH (ref 70–99)

## 2014-02-23 LAB — PRO B NATRIURETIC PEPTIDE: PRO B NATRI PEPTIDE: 16625 pg/mL — AB (ref 0–450)

## 2014-02-23 MED ORDER — METOPROLOL TARTRATE 25 MG PO TABS
25.0000 mg | ORAL_TABLET | Freq: Two times a day (BID) | ORAL | Status: DC
  Administered 2014-02-23 – 2014-02-25 (×4): 25 mg via ORAL
  Filled 2014-02-23 (×4): qty 1

## 2014-02-23 MED ORDER — SIMVASTATIN 20 MG PO TABS
20.0000 mg | ORAL_TABLET | Freq: Every evening | ORAL | Status: DC
  Administered 2014-02-23 – 2014-02-24 (×2): 20 mg via ORAL
  Filled 2014-02-23 (×2): qty 1

## 2014-02-23 MED ORDER — TEMAZEPAM 7.5 MG PO CAPS: 7.5000 mg | ORAL_CAPSULE | Freq: Every evening | ORAL | Status: DC | PRN

## 2014-02-23 MED ORDER — FLUTICASONE PROPIONATE 50 MCG/ACT NA SUSP
1.0000 | Freq: Every day | NASAL | Status: DC
  Administered 2014-02-24 – 2014-02-25 (×2): 1 via NASAL
  Filled 2014-02-23 (×2): qty 16

## 2014-02-23 MED ORDER — ASPIRIN 325 MG PO TABS: 325.0000 mg | ORAL_TABLET | Freq: Every day | ORAL | Status: DC

## 2014-02-23 MED ORDER — CITALOPRAM HYDROBROMIDE 20 MG PO TABS
20.0000 mg | ORAL_TABLET | Freq: Every day | ORAL | Status: DC
  Administered 2014-02-24 – 2014-02-25 (×2): 20 mg via ORAL
  Filled 2014-02-23 (×4): qty 1

## 2014-02-23 MED ORDER — GLUCERNA SHAKE PO LIQD
237.0000 mL | Freq: Three times a day (TID) | ORAL | Status: DC
  Administered 2014-02-24 (×2): 237 mL via ORAL
  Filled 2014-02-23: qty 237

## 2014-02-23 MED ORDER — SODIUM CHLORIDE 1 G PO TABS
1.0000 g | ORAL_TABLET | Freq: Two times a day (BID) | ORAL | Status: DC
  Administered 2014-02-24 – 2014-02-25 (×4): 1 g via ORAL
  Filled 2014-02-23 (×6): qty 1

## 2014-02-23 MED ORDER — TORSEMIDE 20 MG PO TABS
20.0000 mg | ORAL_TABLET | Freq: Every day | ORAL | Status: DC
  Administered 2014-02-24 – 2014-02-25 (×2): 20 mg via ORAL
  Filled 2014-02-23 (×4): qty 1

## 2014-02-23 MED ORDER — PANTOPRAZOLE SODIUM 40 MG IV SOLR
40.0000 mg | INTRAVENOUS | Status: DC
  Administered 2014-02-23 – 2014-02-24 (×2): 40 mg via INTRAVENOUS
  Filled 2014-02-23 (×2): qty 40

## 2014-02-23 MED ORDER — DEXTROSE 5 % IV SOLN
1.0000 g | Freq: Once | INTRAVENOUS | Status: AC
  Administered 2014-02-23: 1 g via INTRAVENOUS
  Filled 2014-02-23: qty 10

## 2014-02-23 MED ORDER — INSULIN ASPART 100 UNIT/ML ~~LOC~~ SOLN
0.0000 [IU] | Freq: Every day | SUBCUTANEOUS | Status: DC
  Administered 2014-02-23 – 2014-02-24 (×2): 2 [IU] via SUBCUTANEOUS

## 2014-02-23 MED ORDER — STROKE: EARLY STAGES OF RECOVERY BOOK
Freq: Once | Status: DC
  Filled 2014-02-23 (×2): qty 1

## 2014-02-23 MED ORDER — ONDANSETRON HCL 4 MG PO TABS: 4.0000 mg | ORAL_TABLET | Freq: Four times a day (QID) | ORAL | Status: DC | PRN

## 2014-02-23 MED ORDER — INSULIN ASPART 100 UNIT/ML ~~LOC~~ SOLN
0.0000 [IU] | Freq: Three times a day (TID) | SUBCUTANEOUS | Status: DC
  Administered 2014-02-24 (×2): 5 [IU] via SUBCUTANEOUS
  Administered 2014-02-24: 3 [IU] via SUBCUTANEOUS
  Administered 2014-02-25: 5 [IU] via SUBCUTANEOUS

## 2014-02-23 NOTE — ED Notes (Signed)
Pt sent from [facility] for confusion x 3 hours and ?too much fluid on board. Pt is alert and oriented x 4 and denies pain. No neuro deficits. nad noted.

## 2014-02-23 NOTE — ED Provider Notes (Signed)
This chart was scribed for [place], DO by Peyton Bottoms, ED Scribe. This patient was seen in room [facility]04/[facility] and the patient's care was started at 6:44 PM.  TIME SEEN: 7:03 PM   CHIEF COMPLAINT: Altered Mental Status  HPI: Patient is a 80 y.o. female from [place], with a history of GERD, PUD, hypertension, CAD, diabetes, CHF, SIADH, hypercholesterolemia, AVR on coumadin who was brought in by son, presents to the ED with complaints of altered mental status. When inquired patient about why she is in the ED today, she states, "I think they faked a fire". Son states that he went to go visit her and noticed that she "coudln't find words, her sentences would not make sense, and could not think of words to say what she wanted to say." He reports that patient has slurred speech. He feels like these speech changes have resolved.  Son also reports that patient's sodium levels have been low for the past few days. Son states over the past 2-3 weeks she has had steady decline and has been confused, was ambulatory and now mostly bed bound.  Son states that patient had a fall 1 week ago but was not evaluated by the ED. Son reports associated vomiting, and usually has an episode after eating at baseline. Patient also has had a cough for the past few days. Son reports that patient will forget what she was doing midway while eating a meal.  Denies any new medications, recent fever, diarrhea, rash.  Pt denies any pain.  ROS: Level V caveat for AMS  PAST MEDICAL HISTORY/PAST SURGICAL HISTORY:  Past Medical History  Diagnosis Date  . GERD (gastroesophageal reflux disease)   . PUD (peptic ulcer disease)   . Anemia   . Hypertension   . S/P endoscopy Aug 2011    3 superficial gastric ulcers, NSAID-induced  . S/P colonoscopy Sept 2011    left-sided diverticula, tubular adenoma  . Coronary artery disease   . Shortness of breath   . Diabetes mellitus     insulin dependent  . Headache(784.0)     rare  migraines  . Cancer     hx of skin cancer  . Arthritis   . Osteopenia   . Hypercholesterolemia   . SIADH (syndrome of inappropriate ADH production)   . CHF (congestive heart failure)   . Myocardial infarction 2013    MEDICATIONS:  Prior to Admission medications   Medication Sig Start Date End Date Taking? Authorizing Provider  acetaminophen (TYLENOL) 500 MG tablet Take 1,000 mg by mouth every 6 (six) hours as needed.    Historical Provider, MD  B-D ULTRAFINE III SHORT PEN 31G X 8 MM MISC USE AS DIRECTED WITH LANTUS PEN Patient not taking: Reported on 02/23/2014 11/21/12   Susy Frizzle, MD  Blood Glucose Monitoring Suppl (ACCU-CHEK NANO SMARTVIEW) W/DEVICE KIT Pt needs monitor, strips (box of 400/ 4 refill), lancets (box of 400/4 refills) She checks her BS qid (before meals & QHS) Dx: 250.00 Patient not taking: Reported on 02/23/2014 02/13/13   Susy Frizzle, MD  citalopram (CELEXA) 20 MG tablet Take 20 mg by mouth daily.    Historical Provider, MD  demeclocycline (DECLOMYCIN) 150 MG tablet Take 1 tablet (150 mg total) by mouth 4 (four) times daily. 12/31/13   Theodis Blaze, MD  feeding supplement, GLUCERNA SHAKE, (GLUCERNA SHAKE) LIQD Take 237 mLs by mouth 3 (three) times daily with meals. 12/13/13   Donielle Liston Alba, PA-C  fluticasone Asencion Islam)  50 MCG/ACT nasal spray Place 1 spray into both nostrils daily. 12/31/13   Theodis Blaze, MD  furosemide (LASIX) 40 MG tablet Take 40 mg by mouth daily. 40 mg in am 80 mg in pm    Historical Provider, MD  insulin aspart (NOVOLOG FLEXPEN) 100 UNIT/ML FlexPen Inject 5-10 Units into the skin 3 (three) times daily with meals. 70-120=0    251-300=5 121-150=1  301-350=7 151-200=2  351-400=9 201-250=3  400 or >, call MD  units of insulin 12/13/13   Donielle Liston Alba, PA-C  insulin glargine (LANTUS) 100 UNIT/ML injection Inject 0.2 mLs (20 Units total) into the skin 2 (two) times daily. 12/13/13   Donielle Liston Alba, PA-C  Melatonin 3 MG  TABS Take 3 mg by mouth at bedtime as needed (insomnia).    Historical Provider, MD  metoprolol tartrate (LOPRESSOR) 25 MG tablet Take 1 tablet (25 mg total) by mouth 2 (two) times daily. Patient taking differently: Take 12.5 mg by mouth 2 (two) times daily.  12/31/13   Theodis Blaze, MD  metroNIDAZOLE (FLAGYL) 500 MG tablet Take 1 tablet (500 mg total) by mouth every 8 (eight) hours. 12/31/13   Theodis Blaze, MD  nystatin cream (MYCOSTATIN) Apply topically 3 (three) times daily. 12/13/13   Donielle Liston Alba, PA-C  simvastatin (ZOCOR) 20 MG tablet Take 1 tablet (20 mg total) by mouth every evening. 08/15/12   Susy Frizzle, MD  temazepam (RESTORIL) 7.5 MG capsule Take 1 capsule (7.5 mg total) by mouth at bedtime as needed for sleep. 12/31/13   Theodis Blaze, MD  warfarin (COUMADIN) 4 MG tablet Take 1 tablet (4 mg total) by mouth daily. Take 4 mg tablet today, check PT/INR in AM October 28th, 2015 and readjust the dose as indicated Patient taking differently: Take 2 mg by mouth daily. Take 4 mg tablet today, check PT/INR in AM October 28th, 2015 and readjust the dose as indicated 12/31/13   Theodis Blaze, MD    ALLERGIES:  Allergies  Allergen Reactions  . Aspirin Other (See Comments)    High doses caused stomach bleeds    SOCIAL HISTORY:  History  Substance Use Topics  . Smoking status: Never Smoker   . Smokeless tobacco: Never Used  . Alcohol Use: No    FAMILY HISTORY: No family history on file.  EXAM:  Triage Vitals: BP 164/69 mmHg  Pulse 74  Temp(Src) 97.9 F (36.6 C)  Resp 18  Wt 188 lb (85.276 kg)  SpO2 98%  CONSTITUTIONAL: Alert and oriented to person and year but no place or situation and responds appropriately to questions intermittently. Well-appearing; well-nourished; elderly, in NAD, pleasant HEAD: Normocephalic EYES: Conjunctivae clear, PERRL ENT: normal nose; no rhinorrhea; moist mucous membranes; pharynx without lesions noted NECK: Supple, no meningismus,  no LAD  CARD: RRR; S1 and S2 appreciated; no murmurs, no clicks, no rubs, no gallops RESP: Normal chest excursion without splinting or tachypnea; breath sounds clear and equal bilaterally; no wheezes, no rhonchi, no rales, no hypoxia ABD/GI: Normal bowel sounds; non-distended; soft, non-tender, no rebound, no guarding BACK:  The back appears normal and is non-tender to palpation, there is no CVA tenderness EXT: Normal ROM in all joints; non-tender to palpation; bilateral LE edema; normal capillary refill; no cyanosis    SKIN: Normal color for age and race; warm NEURO: Moves all extremities equally; slightly diminished strength in bilateral LE, normal sensation, no pronator drift, CN 2-12 intact, normal speech PSYCH: The patient's mood and  manner are appropriate. Grooming and personal hygiene are appropriate.  MEDICAL DECISION MAKING: Pt here with concerning episode for TIA.  Has h/o AVR, HTN, DM on coumadin.  Will also eval for other causes of AMS.  Now neuro intact other than disoriented to place.  Not a TPA candidate since sx resolved.    ED PROGRESS: Labs show subtherapeutic INR, nitrite + UTI.  Urine culture pending.  CT head negative, CXR shows cardiomegaly but no infiltrate or edema.  D/w Dr. Wendee Beavers for admission for possible TIA workup.  WIll give Ceftrixone for UTI.  Pt's son Dionne Bucy updated with plan.  He confirms pt is DNI but not DNR.    EKG Interpretation  Date/Time:  Sunday February 23 2014 19:16:30 EST Ventricular Rate:  74 PR Interval:  162 QRS Duration: 99 QT Interval:  409 QTC Calculation: 104 R Axis:   32 Text Interpretation:  Sinus rhythm Repol abnrm, severe global ischemia (LM/MVD) Confirmed by WARD,  DO, KRISTEN ([id number]) on 02/23/2014 7:23:24 PM        I personally performed the services described in this documentation, which was scribed in my presence. The recorded information has been reviewed and is accurate.  [place], DO 02/24/14 [phone]

## 2014-02-23 NOTE — ED Notes (Addendum)
Patient had brief from nursing home on and incontinent of bowel and bladder. Patient assisted to use bedside commode. Patient given peri care at this time. Patient has unsteady gait.

## 2014-02-23 NOTE — H&P (Signed)
[facility] History and Physical  Claudia [name] FWY:[id number] DOB: 06-13-1931 DOA: 02/23/2014  Referring physician: Dr. Leonides Schanz PCP: Odette Fraction, MD   Chief Complaint: Aphasia  HPI: Claudia [name] is a 80 y.o. female  With history of TIA, HTN, CAD, Atrial fibrillation, Bioprosthetic valve, GERD, and GERD. Pt presented to the hospital after having episode of aphasia while at nursing home. Currently symptoms resolved. Patient was brought in by son given symptoms. Patient is poor historian and history is obtained mostly from son.  CT of head in ED was negative for stroke.  Given concerns for aphasia we were consulted for medical evaluation and recommendations. UTI on urinalysis   Review of Systems:  Unable to assess secondary to AMS  Past Medical History  Diagnosis Date  . GERD (gastroesophageal reflux disease)   . PUD (peptic ulcer disease)   . Anemia   . Hypertension   . S/P endoscopy Aug 2011    3 superficial gastric ulcers, NSAID-induced  . S/P colonoscopy Sept 2011    left-sided diverticula, tubular adenoma  . Coronary artery disease   . Shortness of breath   . Diabetes mellitus     insulin dependent  . Headache(784.0)     rare migraines  . Cancer     hx of skin cancer  . Arthritis   . Osteopenia   . Hypercholesterolemia   . SIADH (syndrome of inappropriate ADH production)   . CHF (congestive heart failure)   . Myocardial infarction 2013   Past Surgical History  Procedure Laterality Date  . Tonsillectomy    . Appendectomy    . Eye surgery      cataracts  . Cardiac stents  07/19/2011  . Esophagogastroduodenoscopy  10/16/09    normal without barrett's/three superficial gastric ulcers  . Colonoscopy  12/04/09    normal rectum/left-sided diverticula  . Joint replacement  arthroscopy to knee  . Toe amputation Left 2013    left second toe amputation  . Cardiac catheterization  01/22/2013  . Coronary angioplasty  07/2011  . Coronary artery bypass  graft N/A 11/08/2013    Procedure: CORONARY ARTERY BYPASS GRAFTING (CABG), on pump, times two, using left internal mammary artery, cryo saphenous vein.;  Surgeon: Ivin Poot, MD;  Location: [place];  Service: Open Heart Surgery;  Laterality: N/A;  LIMA-LAD CRYOVEIN -OM  . Intraoperative transesophageal echocardiogram N/A 11/08/2013    Procedure: INTRAOPERATIVE TRANSESOPHAGEAL ECHOCARDIOGRAM;  Surgeon: Ivin Poot, MD;  Location: [place];  Service: Open Heart Surgery;  Laterality: N/A;  . Mitral valve replacement N/A 11/08/2013    Procedure: MITRAL VALVE (MV) REPLACEMENT;  Surgeon: Ivin Poot, MD;  Location: [place];  Service: Open Heart Surgery;  Laterality: N/A;  #25 MAGNA MITRAL EASE  . Left heart catheterization with coronary angiogram N/A 07/19/2011    Procedure: LEFT HEART CATHETERIZATION WITH CORONARY ANGIOGRAM;  Surgeon: Laverda Page, MD;  Location: [facility] CATH LAB;  Service: Cardiovascular;  Laterality: N/A;  . Left heart catheterization with coronary angiogram N/A 12/21/2011    Procedure: LEFT HEART CATHETERIZATION WITH CORONARY ANGIOGRAM;  Surgeon: Laverda Page, MD;  Location: [facility] CATH LAB;  Service: Cardiovascular;  Laterality: N/A;  . Left heart catheterization with coronary angiogram N/A 02/20/2012    Procedure: LEFT HEART CATHETERIZATION WITH CORONARY ANGIOGRAM;  Surgeon: Laverda Page, MD;  Location: [facility] CATH LAB;  Service: Cardiovascular;  Laterality: N/A;  . Left heart catheterization with coronary angiogram N/A 09/03/2012    Procedure: LEFT HEART CATHETERIZATION  WITH CORONARY ANGIOGRAM;  Surgeon: Laverda Page, MD;  Location: [facility] CATH LAB;  Service: Cardiovascular;  Laterality: N/A;  . Percutaneous coronary stent intervention (pci-s)  09/03/2012    Procedure: PERCUTANEOUS CORONARY STENT INTERVENTION (PCI-S);  Surgeon: Laverda Page, MD;  Location: [facility] CATH LAB;  Service: Cardiovascular;;  . Left heart catheterization with coronary angiogram N/A 12/04/2012    Procedure:  LEFT HEART CATHETERIZATION WITH CORONARY ANGIOGRAM;  Surgeon: Laverda Page, MD;  Location: [facility] CATH LAB;  Service: Cardiovascular;  Laterality: N/A;  . Left heart catheterization with coronary angiogram N/A 01/22/2013    Procedure: LEFT HEART CATHETERIZATION WITH CORONARY ANGIOGRAM;  Surgeon: Laverda Page, MD;  Location: [facility] CATH LAB;  Service: Cardiovascular;  Laterality: N/A;  . Left heart catheterization with coronary angiogram N/A 06/26/2013    Procedure: LEFT HEART CATHETERIZATION WITH CORONARY ANGIOGRAM;  Surgeon: Laverda Page, MD;  Location: [facility] CATH LAB;  Service: Cardiovascular;  Laterality: N/A;  . Left heart catheterization with coronary angiogram N/A 11/05/2013    Procedure: LEFT HEART CATHETERIZATION WITH CORONARY ANGIOGRAM;  Surgeon: Laverda Page, MD;  Location: [facility] CATH LAB;  Service: Cardiovascular;  Laterality: N/A;   Social History:  reports that she has never smoked. She has never used smokeless tobacco. She reports that she does not drink alcohol or use illicit drugs.  Allergies  Allergen Reactions  . Aspirin Other (See Comments)    High doses caused stomach bleeds    No family history on file.   Prior to Admission medications   Medication Sig Start Date End Date Taking? Authorizing Provider  Amino Acids-Protein Hydrolys (FEEDING SUPPLEMENT, PRO-STAT SUGAR FREE 64,) LIQD Take 30 mLs by mouth 2 (two) times daily.   Yes Historical Provider, MD  aspirin EC 81 MG tablet Take 81 mg by mouth daily.   Yes Historical Provider, MD  feeding supplement, GLUCERNA SHAKE, (GLUCERNA SHAKE) LIQD Take 237 mLs by mouth 3 (three) times daily with meals. 12/13/13  Yes Donielle Liston Alba, PA-C  fluticasone (FLONASE) 50 MCG/ACT nasal spray Place 1 spray into both nostrils daily. 12/31/13  Yes Theodis Blaze, MD  insulin aspart (NOVOLOG FLEXPEN) 100 UNIT/ML FlexPen Inject 5-10 Units into the skin 3 (three) times daily with meals. 70-120=0    251-300=5 121-150=1   301-350=7 151-200=2  351-400=9 201-250=3  400 or >, call MD  units of insulin Patient taking differently: Inject 1-9 Units into the skin 3 (three) times daily with meals. Below 70 call MD    251-300=5 121-150=1               301-350=7 151-200=2               351-400=9 201-250=3               400 or >, call MD  units of insulin 12/13/13  Yes Donielle Liston Alba, PA-C  linezolid (ZYVOX) 600 MG tablet Take 600 mg by mouth 2 (two) times daily.   Yes Historical Provider, MD  metoprolol tartrate (LOPRESSOR) 25 MG tablet Take 1 tablet (25 mg total) by mouth 2 (two) times daily. 12/31/13  Yes Theodis Blaze, MD  ondansetron (ZOFRAN) 4 MG tablet Take 4 mg by mouth every 6 (six) hours as needed for nausea or vomiting.   Yes Historical Provider, MD  pantoprazole (PROTONIX) 40 MG tablet Take 40 mg by mouth daily.   Yes Historical Provider, MD  sennosides-docusate sodium (SENOKOT-S) 8.6-50 MG tablet Take 1 tablet by mouth 2 (two)  times daily.   Yes Historical Provider, MD  simvastatin (ZOCOR) 20 MG tablet Take 1 tablet (20 mg total) by mouth every evening. 08/15/12  Yes Susy Frizzle, MD  sodium chloride 1 G tablet Take 1 g by mouth 2 (two) times daily.   Yes Historical Provider, MD  torsemide (DEMADEX) 20 MG tablet Take 20 mg by mouth daily.   Yes Historical Provider, MD  acetaminophen (TYLENOL) 500 MG tablet Take 1,000 mg by mouth every 6 (six) hours as needed.    Historical Provider, MD  B-D ULTRAFINE III SHORT PEN 31G X 8 MM MISC USE AS DIRECTED WITH LANTUS PEN Patient not taking: Reported on 02/23/2014 11/21/12   Susy Frizzle, MD  Blood Glucose Monitoring Suppl (ACCU-CHEK NANO SMARTVIEW) W/DEVICE KIT Pt needs monitor, strips (box of 400/ 4 refill), lancets (box of 400/4 refills) She checks her BS qid (before meals & QHS) Dx: 250.00 Patient not taking: Reported on 02/23/2014 02/13/13   Susy Frizzle, MD  citalopram (CELEXA) 20 MG tablet Take 20 mg by mouth daily.    Historical Provider, MD   demeclocycline (DECLOMYCIN) 150 MG tablet Take 1 tablet (150 mg total) by mouth 4 (four) times daily. Patient not taking: Reported on 02/23/2014 12/31/13   Theodis Blaze, MD  insulin glargine (LANTUS) 100 UNIT/ML injection Inject 0.2 mLs (20 Units total) into the skin 2 (two) times daily. Patient not taking: Reported on 02/23/2014 12/13/13   Nani Skillern, PA-C  Melatonin 3 MG TABS Take 3 mg by mouth at bedtime as needed (insomnia).    Historical Provider, MD  metroNIDAZOLE (FLAGYL) 500 MG tablet Take 1 tablet (500 mg total) by mouth every 8 (eight) hours. Patient not taking: Reported on 02/23/2014 12/31/13   Theodis Blaze, MD  nystatin cream (MYCOSTATIN) Apply topically 3 (three) times daily. Patient not taking: Reported on 02/23/2014 12/13/13   Nani Skillern, PA-C  temazepam (RESTORIL) 7.5 MG capsule Take 1 capsule (7.5 mg total) by mouth at bedtime as needed for sleep. 12/31/13   Theodis Blaze, MD  warfarin (COUMADIN) 4 MG tablet Take 1 tablet (4 mg total) by mouth daily. Take 4 mg tablet today, check PT/INR in AM October 28th, 2015 and readjust the dose as indicated Patient not taking: Reported on 02/23/2014 12/31/13   Theodis Blaze, MD   Physical Exam: Filed Vitals:   02/23/14 2045 02/23/14 2057 02/23/14 2100 02/23/14 2130  BP:   156/62 155/65  Pulse: 74  74 74  Temp:  97.9 F (36.6 C)    Resp: 15  16 17   Weight:      SpO2: 100%  99% 99%    Wt Readings from Last 3 Encounters:  02/23/14 85.276 kg (188 lb)  01/29/14 85.276 kg (188 lb)  01/08/14 85.73 kg (189 lb)    General:  Appears calm and comfortable Eyes: PERRL, normal lids, irises & conjunctiva ENT: grossly normal hearing, lips & tongue Neck: no LAD, masses or thyromegaly Cardiovascular: RRR, no rubs. No LE edema. Respiratory: CTA bilaterally, no w/r/r. Normal respiratory effort. Abdomen: soft, nt, nd Skin: no rash or induration seen on limited exam Musculoskeletal: grossly normal tone  BUE/BLE Psychiatric: unable to accurately assess due to AMS Neurologic: moves extremities equally, no facial asymmetry          Labs on Admission:  Basic Metabolic Panel:  Recent Labs Lab 02/23/14 1915  NA 131*  K 3.8  CL 89*  CO2 26  GLUCOSE 252*  BUN 24*  CREATININE 1.43*  CALCIUM 9.1   Liver Function Tests:  Recent Labs Lab 02/23/14 1915  AST 12  ALT 9  ALKPHOS 100  BILITOT 0.9  PROT 6.9  ALBUMIN 2.9*   No results for input(s): LIPASE, AMYLASE in the last 168 hours. No results for input(s): AMMONIA in the last 168 hours. CBC:  Recent Labs Lab 02/23/14 1915  WBC 7.7  NEUTROABS 6.2  HGB 10.5*  HCT 32.0*  MCV 84.4  PLT 167   Cardiac Enzymes:  Recent Labs Lab 02/23/14 1915  TROPONINI <0.30    BNP (last 3 results)  Recent Labs  12/29/13 0340 12/30/13 0255 02/23/14 1915  PROBNP 10783.0* 12014.0* 16625.0*   CBG: No results for input(s): GLUCAP in the last 168 hours.  Radiological Exams on Admission: Dg Chest 2 View  02/23/2014   CLINICAL DATA:  Confusion  EXAM: CHEST  2 VIEW  COMPARISON:  01/08/2014  FINDINGS: Evidence of median sternotomy, coronary stent placement, and mitral valvuloplasty. Heart size at upper limits of normal. Left basilar scarring is stable. Mild central vascular congestion is noted. Trace superimposed left pleural fluid is present.  IMPRESSION: Central vascular congestion without overt edema.  Trace left pleural fluid superimposed on left basilar scarring.   Electronically Signed   By: Conchita Paris M.D.   On: 02/23/2014 20:07   Ct Head Wo Contrast  02/23/2014   CLINICAL DATA:  Altered mental status.  Fall 1 week ago.  EXAM: CT HEAD WITHOUT CONTRAST  TECHNIQUE: Contiguous axial images were obtained from the base of the skull through the vertex without intravenous contrast.  COMPARISON:  Head CT scan 12/06/2013.  FINDINGS: There is no evidence of acute intracranial abnormality including hemorrhage, infarct, mass lesion, mass  effect, midline shift or abnormal extra-axial fluid collection. Atrophy and chronic microvascular ischemic change are stable in appearance. Imaged paranasal sinuses and mastoid air cells are clear.  IMPRESSION: No acute abnormality.  Atrophy and chronic microvascular ischemic change   Electronically Signed   By: Inge Rise M.D.   On: 02/23/2014 20:17    EKG: Independently reviewed. Sinus Rhythm with no ST elevations or depressions  Assessment/Plan Principle Problem:   TIA (transient ischemic attack) - Concerns given aphasia  - Will place TIA order work up  - telemetry monitoring - Place order for neurology evaluation  Active Problems:   GERD - Protonix    CAD (coronary artery disease), native coronary artery - Pt on aspirin - Pt on statin    Benign hypertension -  Pt has B blocker on board    DM (diabetes mellitus), type 2 -SSI - Diabetic diet - cbg monitoring    CHF (congestive heart failure) -compensated currently. Continue home regimen.    Atrial fibrillation - Continue B blocker - Continue Warfarin per pharmacy consult    UTI (urinary tract infection) - Rocephin - Will f/u with urine culture - May be contributing AMS  Code Status: Full DVT Prophylaxis: Coumadin Family Communication: Discussed with son at bedsid Disposition Plan: *  Time spent: > 55 minutes  [place], [facility] Pager [phone]

## 2014-02-24 ENCOUNTER — Observation Stay (HOSPITAL_COMMUNITY): Payer: Commercial Managed Care - HMO

## 2014-02-24 ENCOUNTER — Encounter (HOSPITAL_COMMUNITY): Payer: Self-pay | Admitting: Internal Medicine

## 2014-02-24 DIAGNOSIS — G3184 Mild cognitive impairment, so stated: Secondary | ICD-10-CM | POA: Diagnosis present

## 2014-02-24 DIAGNOSIS — I509 Heart failure, unspecified: Secondary | ICD-10-CM | POA: Diagnosis present

## 2014-02-24 DIAGNOSIS — N390 Urinary tract infection, site not specified: Secondary | ICD-10-CM | POA: Insufficient documentation

## 2014-02-24 DIAGNOSIS — Z7901 Long term (current) use of anticoagulants: Secondary | ICD-10-CM | POA: Diagnosis not present

## 2014-02-24 DIAGNOSIS — I252 Old myocardial infarction: Secondary | ICD-10-CM | POA: Diagnosis not present

## 2014-02-24 DIAGNOSIS — Z886 Allergy status to analgesic agent status: Secondary | ICD-10-CM | POA: Diagnosis not present

## 2014-02-24 DIAGNOSIS — E119 Type 2 diabetes mellitus without complications: Secondary | ICD-10-CM | POA: Diagnosis present

## 2014-02-24 DIAGNOSIS — R4182 Altered mental status, unspecified: Secondary | ICD-10-CM

## 2014-02-24 DIAGNOSIS — Z951 Presence of aortocoronary bypass graft: Secondary | ICD-10-CM | POA: Diagnosis not present

## 2014-02-24 DIAGNOSIS — I369 Nonrheumatic tricuspid valve disorder, unspecified: Secondary | ICD-10-CM

## 2014-02-24 DIAGNOSIS — N184 Chronic kidney disease, stage 4 (severe): Secondary | ICD-10-CM

## 2014-02-24 DIAGNOSIS — D649 Anemia, unspecified: Secondary | ICD-10-CM | POA: Diagnosis present

## 2014-02-24 DIAGNOSIS — E780 Pure hypercholesterolemia: Secondary | ICD-10-CM | POA: Diagnosis present

## 2014-02-24 DIAGNOSIS — I4891 Unspecified atrial fibrillation: Secondary | ICD-10-CM

## 2014-02-24 DIAGNOSIS — Z955 Presence of coronary angioplasty implant and graft: Secondary | ICD-10-CM | POA: Diagnosis not present

## 2014-02-24 DIAGNOSIS — G459 Transient cerebral ischemic attack, unspecified: Secondary | ICD-10-CM | POA: Diagnosis present

## 2014-02-24 DIAGNOSIS — Z794 Long term (current) use of insulin: Secondary | ICD-10-CM | POA: Diagnosis not present

## 2014-02-24 DIAGNOSIS — M1990 Unspecified osteoarthritis, unspecified site: Secondary | ICD-10-CM | POA: Diagnosis present

## 2014-02-24 DIAGNOSIS — I2510 Atherosclerotic heart disease of native coronary artery without angina pectoris: Secondary | ICD-10-CM | POA: Diagnosis present

## 2014-02-24 DIAGNOSIS — I480 Paroxysmal atrial fibrillation: Secondary | ICD-10-CM | POA: Diagnosis present

## 2014-02-24 DIAGNOSIS — K219 Gastro-esophageal reflux disease without esophagitis: Secondary | ICD-10-CM | POA: Diagnosis present

## 2014-02-24 DIAGNOSIS — R4701 Aphasia: Secondary | ICD-10-CM | POA: Diagnosis present

## 2014-02-24 DIAGNOSIS — E871 Hypo-osmolality and hyponatremia: Secondary | ICD-10-CM

## 2014-02-24 DIAGNOSIS — I129 Hypertensive chronic kidney disease with stage 1 through stage 4 chronic kidney disease, or unspecified chronic kidney disease: Secondary | ICD-10-CM | POA: Diagnosis present

## 2014-02-24 DIAGNOSIS — Z8711 Personal history of peptic ulcer disease: Secondary | ICD-10-CM | POA: Diagnosis not present

## 2014-02-24 DIAGNOSIS — M8580 Other specified disorders of bone density and structure, unspecified site: Secondary | ICD-10-CM | POA: Diagnosis present

## 2014-02-24 LAB — PROTIME-INR
INR: 1.16 (ref 0.00–1.49)
PROTHROMBIN TIME: 15 s (ref 11.6–15.2)

## 2014-02-24 LAB — GLUCOSE, CAPILLARY
GLUCOSE-CAPILLARY: 198 mg/dL — AB (ref 70–99)
GLUCOSE-CAPILLARY: 201 mg/dL — AB (ref 70–99)
Glucose-Capillary: 229 mg/dL — ABNORMAL HIGH (ref 70–99)
Glucose-Capillary: 249 mg/dL — ABNORMAL HIGH (ref 70–99)

## 2014-02-24 LAB — CBC
HEMATOCRIT: 30.9 % — AB (ref 36.0–46.0)
HEMOGLOBIN: 10 g/dL — AB (ref 12.0–15.0)
MCH: 27.3 pg (ref 26.0–34.0)
MCHC: 32.4 g/dL (ref 30.0–36.0)
MCV: 84.4 fL (ref 78.0–100.0)
Platelets: 138 10*3/uL — ABNORMAL LOW (ref 150–400)
RBC: 3.66 MIL/uL — AB (ref 3.87–5.11)
RDW: 18 % — ABNORMAL HIGH (ref 11.5–15.5)
WBC: 8.2 10*3/uL (ref 4.0–10.5)

## 2014-02-24 LAB — HEMOGLOBIN A1C
HEMOGLOBIN A1C: 8.8 % — AB (ref <5.7)
Mean Plasma Glucose: 206 mg/dL — ABNORMAL HIGH (ref <117)

## 2014-02-24 LAB — BASIC METABOLIC PANEL
Anion gap: 13 (ref 5–15)
BUN: 20 mg/dL (ref 6–23)
CHLORIDE: 92 meq/L — AB (ref 96–112)
CO2: 28 meq/L (ref 19–32)
Calcium: 9 mg/dL (ref 8.4–10.5)
Creatinine, Ser: 1.31 mg/dL — ABNORMAL HIGH (ref 0.50–1.10)
GFR calc non Af Amer: 37 mL/min — ABNORMAL LOW (ref >90)
GFR, EST AFRICAN AMERICAN: 43 mL/min — AB (ref >90)
Glucose, Bld: 201 mg/dL — ABNORMAL HIGH (ref 70–99)
Potassium: 4 mEq/L (ref 3.7–5.3)
Sodium: 133 mEq/L — ABNORMAL LOW (ref 137–147)

## 2014-02-24 LAB — MRSA PCR SCREENING: MRSA by PCR: NEGATIVE

## 2014-02-24 LAB — LIPID PANEL
Cholesterol: 141 mg/dL (ref 0–200)
HDL: 90 mg/dL (ref >39)
LDL CALC: 38 mg/dL (ref 0–99)
TRIGLYCERIDES: 63 mg/dL (ref <150)
Total CHOL/HDL Ratio: 1.6 RATIO
VLDL: 13 mg/dL (ref 0–40)

## 2014-02-24 MED ORDER — INFLUENZA VAC SPLIT QUAD 0.5 ML IM SUSY
0.5000 mL | PREFILLED_SYRINGE | Freq: Once | INTRAMUSCULAR | Status: DC
  Filled 2014-02-24: qty 0.5

## 2014-02-24 MED ORDER — CEFTRIAXONE SODIUM IN DEXTROSE 20 MG/ML IV SOLN
1.0000 g | INTRAVENOUS | Status: DC
  Administered 2014-02-24: 1 g via INTRAVENOUS
  Filled 2014-02-24 (×2): qty 50

## 2014-02-24 MED ORDER — SODIUM CHLORIDE 0.9 % IV SOLN
INTRAVENOUS | Status: DC
  Administered 2014-02-24: 20:00:00 via INTRAVENOUS

## 2014-02-24 MED ORDER — ASPIRIN 81 MG PO CHEW
162.0000 mg | CHEWABLE_TABLET | Freq: Every day | ORAL | Status: DC
  Administered 2014-02-24 – 2014-02-25 (×2): 162 mg via ORAL
  Filled 2014-02-24 (×2): qty 2

## 2014-02-24 MED ORDER — INSULIN DETEMIR 100 UNIT/ML ~~LOC~~ SOLN
10.0000 [IU] | Freq: Every day | SUBCUTANEOUS | Status: DC
  Administered 2014-02-24: 10 [IU] via SUBCUTANEOUS
  Filled 2014-02-24 (×2): qty 0.1

## 2014-02-24 NOTE — Progress Notes (Signed)
*  PRELIMINARY RESULTS* Echocardiogram 2D Echocardiogram has been performed.  Leavy Cella 02/24/2014, 3:01 PM

## 2014-02-24 NOTE — Consult Note (Signed)
[place] A. Merlene Laughter, MD     [email or web address]          Artrice GRADIE BUTRICK is an 80 y.o. female.   ASSESSMENT/PLAN:  1. Transient ischemic attack. Risk factors coronary artery disease, age, DM,  hypertension and possible paroxysmal atrial fibrillation.  2. Cognitive impairment at baseline which appears to be mild.  3. Acute on chronic cognitive impairment. She does appear to have a UTI which is considered likely the etiology of the acute confusional state.  Recommendations: 1. Impaired and play agents for now.  2. 4 weeks Holter monitor for possible paroxysmal atrial fibrillation.  3. Dementia labs.  The patient is a 80 year old white female who presents with acute onset of transient aphasia. She does have some cognitive impairment at baseline with the patient be mild. The patient recently underwent coronary artery bypass grafting along with mitral valve replacement. This appears to be a nonmechanical valve. It appears the patient had postoperative atrial fibrillation and was placed on warfarin therapy. This was discontinued by her cardiologist on follow-up because of the risk of falling. However, no falling has been documented. The patient appears to have had some cognitive impairment at baseline but this is worsened over baseline. She has been diagnosed with UTI which appears to be controlling. Review of systems is limited due to the confusion. She does not report any complaints at this time although she appears to be having nonproductive coughing. TPA not given because of transient event.  GENERAL: Pleasant thin female in no acute distress.  HEENT: Supple. Atraumatic normocephalic. She does have nonproductive cough. She is mildly dyspneic due to a cough.  ABDOMEN: soft  EXTREMITIES: 2-3+ pitting edema of the legs and ankles.   BACK: Normal.  SKIN: Normal by inspection.    MENTAL STATUS: She is awake and alert. She knows her age. She knows that she is in  the hospital at [facility]. She is not oriented to time. Fluency seems adequate. Naming was good in 4-5 objects tested. No dysarthria observed.  CRANIAL NERVES: Pupils are equal, round and reactive to light and accommodation; extra ocular movements are full, there is no significant nystagmus; visual fields are full; upper and lower facial muscles are normal in strength and symmetric, there is no flattening of the nasolabial folds; tongue is midline; uvula is midline; shoulder elevation is normal.  MOTOR: Normal tone, bulk and strength in the upper extremities; no pronator drift. Leg strength 2/5.  COORDINATION: Left finger to nose is normal, right finger to nose is normal, No rest tremor; no intention tremor; no postural tremor; no bradykinesia. Unable to do heel-to-shin.  REFLEXES: Deep tendon reflexes are symmetrical and normal except for the knees where they're absent due to severe arthritic changes. Babinski reflexes are flexor bilaterally.   SENSATION: Normal to light touch. No visual or tactile neglect.  NIHSS 7.     Blood pressure 150/65, pulse 72, temperature 98 F (36.7 C), temperature source Oral, resp. rate 20, height 5' 4"  (1.626 m), weight 86.864 kg (191 lb 8 oz), SpO2 98 %.  Past Medical History  Diagnosis Date  . GERD (gastroesophageal reflux disease)   . PUD (peptic ulcer disease)   . Anemia   . Hypertension   . S/P endoscopy Aug 2011    3 superficial gastric ulcers, NSAID-induced  . S/P colonoscopy Sept 2011    left-sided diverticula, tubular adenoma  . Coronary artery disease   . Shortness of breath   . Diabetes mellitus  insulin dependent  . Headache(784.0)     rare migraines  . Cancer     hx of skin cancer  . Arthritis   . Osteopenia   . Hypercholesterolemia   . SIADH (syndrome of inappropriate ADH production)   . CHF (congestive heart failure)   . Myocardial infarction 2013  . Chronic renal failure, stage 3 (moderate)     Past Surgical History    Procedure Laterality Date  . Tonsillectomy    . Appendectomy    . Eye surgery      cataracts  . Cardiac stents  07/19/2011  . Esophagogastroduodenoscopy  10/16/09    normal without barrett's/three superficial gastric ulcers  . Colonoscopy  12/04/09    normal rectum/left-sided diverticula  . Joint replacement  arthroscopy to knee  . Toe amputation Left 2013    left second toe amputation  . Cardiac catheterization  01/22/2013  . Coronary angioplasty  07/2011  . Coronary artery bypass graft N/A 11/08/2013    Procedure: CORONARY ARTERY BYPASS GRAFTING (CABG), on pump, times two, using left internal mammary artery, cryo saphenous vein.;  Surgeon: Ivin Poot, MD;  Location: [place];  Service: Open Heart Surgery;  Laterality: N/A;  LIMA-LAD CRYOVEIN -OM  . Intraoperative transesophageal echocardiogram N/A 11/08/2013    Procedure: INTRAOPERATIVE TRANSESOPHAGEAL ECHOCARDIOGRAM;  Surgeon: Ivin Poot, MD;  Location: [place];  Service: Open Heart Surgery;  Laterality: N/A;  . Mitral valve replacement N/A 11/08/2013    Procedure: MITRAL VALVE (MV) REPLACEMENT;  Surgeon: Ivin Poot, MD;  Location: [place];  Service: Open Heart Surgery;  Laterality: N/A;  #25 MAGNA MITRAL EASE  . Left heart catheterization with coronary angiogram N/A 07/19/2011    Procedure: LEFT HEART CATHETERIZATION WITH CORONARY ANGIOGRAM;  Surgeon: Laverda Page, MD;  Location: [facility] CATH LAB;  Service: Cardiovascular;  Laterality: N/A;  . Left heart catheterization with coronary angiogram N/A 12/21/2011    Procedure: LEFT HEART CATHETERIZATION WITH CORONARY ANGIOGRAM;  Surgeon: Laverda Page, MD;  Location: [facility] CATH LAB;  Service: Cardiovascular;  Laterality: N/A;  . Left heart catheterization with coronary angiogram N/A 02/20/2012    Procedure: LEFT HEART CATHETERIZATION WITH CORONARY ANGIOGRAM;  Surgeon: Laverda Page, MD;  Location: [facility] C[facility] LAB;  Service: Cardiovascular;  Laterality: N/A;  . Left heart catheterization  with coronary angiogram N/A 09/03/2012    Procedure: LEFT HEART CATHETERIZATION WITH CORONARY ANGIOGRAM;  Surgeon: Laverda Page, MD;  Location: [facility] CATH LAB;  Service: Cardiovascular;  Laterality: N/A;  . Percutaneous coronary stent intervention (pci-s)  09/03/2012    Procedure: PERCUTANEOUS CORONARY STENT INTERVENTION (PCI-S);  Surgeon: Laverda Page, MD;  Location: [facility] CATH LAB;  Service: Cardiovascular;;  . Left heart catheterization with coronary angiogram N/A 12/04/2012    Procedure: LEFT HEART CATHETERIZATION WITH CORONARY ANGIOGRAM;  Surgeon: Laverda Page, MD;  Location: [facility] CATH LAB;  Service: Cardiovascular;  Laterality: N/A;  . Left heart catheterization with coronary angiogram N/A 01/22/2013    Procedure: LEFT HEART CATHETERIZATION WITH CORONARY ANGIOGRAM;  Surgeon: Laverda Page, MD;  Location: [facility] CATH LAB;  Service: Cardiovascular;  Laterality: N/A;  . Left heart catheterization with coronary angiogram N/A 06/26/2013    Procedure: LEFT HEART CATHETERIZATION WITH CORONARY ANGIOGRAM;  Surgeon: Laverda Page, MD;  Location: [facility] CATH LAB;  Service: Cardiovascular;  Laterality: N/A;  . Left heart catheterization with coronary angiogram N/A 11/05/2013    Procedure: LEFT HEART CATHETERIZATION WITH CORONARY ANGIOGRAM;  Surgeon: Laverda Page, MD;  Location: [facility] CATH LAB;  Service: Cardiovascular;  Laterality: N/A;    History reviewed. No pertinent family history.  Social History:  reports that she has never smoked. She has never used smokeless tobacco. She reports that she does not drink alcohol or use illicit drugs.  Allergies:  Allergies  Allergen Reactions  . Aspirin Other (See Comments)    High doses caused stomach bleeds    Medications: Prior to Admission medications   Medication Sig Start Date End Date Taking? Authorizing Provider  Amino Acids-Protein Hydrolys (FEEDING SUPPLEMENT, PRO-STAT SUGAR FREE 64,) LIQD Take 30 mLs by mouth 2 (two) times daily.   Yes  Historical Provider, MD  aspirin EC 81 MG tablet Take 81 mg by mouth daily.   Yes Historical Provider, MD  feeding supplement, GLUCERNA SHAKE, (GLUCERNA SHAKE) LIQD Take 237 mLs by mouth 3 (three) times daily with meals. 12/13/13  Yes Donielle Liston Alba, PA-C  fluticasone (FLONASE) 50 MCG/ACT nasal spray Place 1 spray into both nostrils daily. 12/31/13  Yes Theodis Blaze, MD  insulin aspart (NOVOLOG FLEXPEN) 100 UNIT/ML FlexPen Inject 5-10 Units into the skin 3 (three) times daily with meals. 70-120=0    251-300=5 121-150=1  301-350=7 151-200=2  351-400=9 201-250=3  400 or >, call MD  units of insulin Patient taking differently: Inject 1-9 Units into the skin 3 (three) times daily with meals. Below 70 call MD    251-300=5 121-150=1               301-350=7 151-200=2               351-400=9 201-250=3               400 or >, call MD  units of insulin 12/13/13  Yes Donielle Liston Alba, PA-C  linezolid (ZYVOX) 600 MG tablet Take 600 mg by mouth 2 (two) times daily.   Yes Historical Provider, MD  metoprolol tartrate (LOPRESSOR) 25 MG tablet Take 1 tablet (25 mg total) by mouth 2 (two) times daily. 12/31/13  Yes Theodis Blaze, MD  ondansetron (ZOFRAN) 4 MG tablet Take 4 mg by mouth every 6 (six) hours as needed for nausea or vomiting.   Yes Historical Provider, MD  pantoprazole (PROTONIX) 40 MG tablet Take 40 mg by mouth daily.   Yes Historical Provider, MD  sennosides-docusate sodium (SENOKOT-S) 8.6-50 MG tablet Take 1 tablet by mouth 2 (two) times daily.   Yes Historical Provider, MD  simvastatin (ZOCOR) 20 MG tablet Take 1 tablet (20 mg total) by mouth every evening. 08/15/12  Yes Susy Frizzle, MD  sodium chloride 1 G tablet Take 1 g by mouth 2 (two) times daily.   Yes Historical Provider, MD  torsemide (DEMADEX) 20 MG tablet Take 20 mg by mouth daily.   Yes Historical Provider, MD  acetaminophen (TYLENOL) 500 MG tablet Take 1,000 mg by mouth every 6 (six) hours as needed.    Historical  Provider, MD  B-D ULTRAFINE III SHORT PEN 31G X 8 MM MISC USE AS DIRECTED WITH LANTUS PEN Patient not taking: Reported on 02/23/2014 11/21/12   Susy Frizzle, MD  Blood Glucose Monitoring Suppl (ACCU-CHEK NANO SMARTVIEW) W/DEVICE KIT Pt needs monitor, strips (box of 400/ 4 refill), lancets (box of 400/4 refills) She checks her BS qid (before meals & QHS) Dx: 250.00 Patient not taking: Reported on 02/23/2014 02/13/13   Susy Frizzle, MD  citalopram (CELEXA) 20 MG tablet Take 20 mg by mouth daily.  Historical Provider, MD  demeclocycline (DECLOMYCIN) 150 MG tablet Take 1 tablet (150 mg total) by mouth 4 (four) times daily. Patient not taking: Reported on 02/23/2014 12/31/13   Theodis Blaze, MD  insulin glargine (LANTUS) 100 UNIT/ML injection Inject 0.2 mLs (20 Units total) into the skin 2 (two) times daily. Patient not taking: Reported on 02/23/2014 12/13/13   Nani Skillern, PA-C  Melatonin 3 MG TABS Take 3 mg by mouth at bedtime as needed (insomnia).    Historical Provider, MD  metroNIDAZOLE (FLAGYL) 500 MG tablet Take 1 tablet (500 mg total) by mouth every 8 (eight) hours. Patient not taking: Reported on 02/23/2014 12/31/13   Theodis Blaze, MD  nystatin cream (MYCOSTATIN) Apply topically 3 (three) times daily. Patient not taking: Reported on 02/23/2014 12/13/13   Nani Skillern, PA-C  temazepam (RESTORIL) 7.5 MG capsule Take 1 capsule (7.5 mg total) by mouth at bedtime as needed for sleep. 12/31/13   Theodis Blaze, MD  warfarin (COUMADIN) 4 MG tablet Take 1 tablet (4 mg total) by mouth daily. Take 4 mg tablet today, check PT/INR in AM October 28th, 2015 and readjust the dose as indicated Patient not taking: Reported on 02/23/2014 12/31/13   Theodis Blaze, MD    Scheduled Meds: .  stroke: mapping our early stages of recovery book   Does not apply Once  . aspirin  325 mg Oral Daily  . cefTRIAXone (ROCEPHIN)  IV  1 g Intravenous Q24H  . citalopram  20 mg Oral Daily  . feeding  supplement (GLUCERNA SHAKE)  237 mL Oral TID WC  . fluticasone  1 spray Each Nare Daily  . Influenza vac split quadrivalent PF  0.5 mL Intramuscular Once  . insulin aspart  0-15 Units Subcutaneous TID WC  . insulin aspart  0-5 Units Subcutaneous QHS  . insulin detemir  10 Units Subcutaneous q1800  . metoprolol tartrate  25 mg Oral BID  . pantoprazole (PROTONIX) IV  40 mg Intravenous Q24H  . simvastatin  20 mg Oral QPM  . sodium chloride  1 g Oral BID  . torsemide  20 mg Oral Daily   Continuous Infusions:  PRN Meds:.ondansetron, temazepam     Results for orders placed or performed during the hospital encounter of 02/23/14 (from the past 48 hour(s))  Protime-INR     Status: None   Collection Time: 02/23/14  6:15 PM  Result Value Ref Range   Prothrombin Time 14.3 11.6 - 15.2 seconds   INR 1.10 0.00 - 1.49  CBC with Differential     Status: Abnormal   Collection Time: 02/23/14  7:15 PM  Result Value Ref Range   WBC 7.7 4.0 - 10.5 K/uL   RBC 3.79 (L) 3.87 - 5.11 MIL/uL   Hemoglobin 10.5 (L) 12.0 - 15.0 g/dL   HCT 32.0 (L) 36.0 - 46.0 %   MCV 84.4 78.0 - 100.0 fL   MCH 27.7 26.0 - 34.0 pg   MCHC 32.8 30.0 - 36.0 g/dL   RDW 18.0 (H) 11.5 - 15.5 %   Platelets 167 150 - 400 K/uL   Neutrophils Relative % 81 (H) 43 - 77 %   Neutro Abs 6.2 1.7 - 7.7 K/uL   Lymphocytes Relative 12 12 - 46 %   Lymphs Abs 0.9 0.7 - 4.0 K/uL   Monocytes Relative 6 3 - 12 %   Monocytes Absolute 0.5 0.1 - 1.0 K/uL   Eosinophils Relative 1 0 - 5 %  Eosinophils Absolute 0.1 0.0 - 0.7 K/uL   Basophils Relative 0 0 - 1 %   Basophils Absolute 0.0 0.0 - 0.1 K/uL  Comprehensive metabolic panel     Status: Abnormal   Collection Time: 02/23/14  7:15 PM  Result Value Ref Range   Sodium 131 (L) 137 - 147 mEq/L   Potassium 3.8 3.7 - 5.3 mEq/L   Chloride 89 (L) 96 - 112 mEq/L   CO2 26 19 - 32 mEq/L   Glucose, Bld 252 (H) 70 - 99 mg/dL   BUN 24 (H) 6 - 23 mg/dL   Creatinine, Ser 1.43 (H) 0.50 - 1.10 mg/dL    Calcium 9.1 8.4 - 10.5 mg/dL   Total Protein 6.9 6.0 - 8.3 g/dL   Albumin 2.9 (L) 3.5 - 5.2 g/dL   AST 12 0 - 37 U/L   ALT 9 0 - 35 U/L   Alkaline Phosphatase 100 39 - 117 U/L   Total Bilirubin 0.9 0.3 - 1.2 mg/dL   GFR calc non Af Amer 34 (L) >90 mL/min   GFR calc Af Amer 39 (L) >90 mL/min    Comment: (NOTE) The eGFR has been calculated using the CKD EPI equation. This calculation has not been validated in all clinical situations. eGFR's persistently <90 mL/min signify possible Chronic Kidney Disease.    Anion gap 16 (H) 5 - 15  Troponin I     Status: None   Collection Time: 02/23/14  7:15 PM  Result Value Ref Range   Troponin I <0.30 <0.30 ng/mL    Comment:        Due to the release kinetics of cTnI, a negative result within the first hours of the onset of symptoms does not rule out myocardial infarction with certainty. If myocardial infarction is still suspected, repeat the test at appropriate intervals.   Pro b natriuretic peptide (BNP)     Status: Abnormal   Collection Time: 02/23/14  7:15 PM  Result Value Ref Range   Pro B Natriuretic peptide (BNP) 16625.0 (H) 0 - 450 pg/mL  Urinalysis, Routine w reflex microscopic     Status: Abnormal   Collection Time: 02/23/14  8:49 PM  Result Value Ref Range   Color, Urine YELLOW YELLOW   APPearance HAZY (A) CLEAR   Specific Gravity, Urine 1.010 1.005 - 1.030   pH 6.0 5.0 - 8.0   Glucose, UA >1000 (A) NEGATIVE mg/dL   Hgb urine dipstick TRACE (A) NEGATIVE   Bilirubin Urine NEGATIVE NEGATIVE   Ketones, ur NEGATIVE NEGATIVE mg/dL   Protein, ur TRACE (A) NEGATIVE mg/dL   Urobilinogen, UA 0.2 0.0 - 1.0 mg/dL   Nitrite POSITIVE (A) NEGATIVE   Leukocytes, UA NEGATIVE NEGATIVE  Urine microscopic-add on     Status: Abnormal   Collection Time: 02/23/14  8:49 PM  Result Value Ref Range   Squamous Epithelial / LPF FEW (A) RARE   WBC, UA 7-10 <3 WBC/hpf   RBC / HPF 3-6 <3 RBC/hpf   Bacteria, UA MANY (A) RARE  Hemoglobin A1c      Status: Abnormal   Collection Time: 02/23/14 10:00 PM  Result Value Ref Range   Hgb A1c MFr Bld 8.8 (H) <5.7 %    Comment: (NOTE)  According to the ADA Clinical Practice Recommendations for 2011, when HbA1c is used as a screening test:  >=6.5%   Diagnostic of Diabetes Mellitus           (if abnormal result is confirmed) 5.7-6.4%   Increased risk of developing Diabetes Mellitus References:Diagnosis and Classification of Diabetes Mellitus,Diabetes QXIH,0388,82(CMKLK 1):S62-S69 and Standards of Medical Care in         Diabetes - 2011,Diabetes JZPH,1505,69 (Suppl 1):S11-S61.    Mean Plasma Glucose 206 (H) <117 mg/dL    Comment: Performed at [facility]  Glucose, capillary     Status: Abnormal   Collection Time: 02/23/14 11:50 PM  Result Value Ref Range   Glucose-Capillary 206 (H) 70 - 99 mg/dL   Comment 1 Documented in Chart    Comment 2 Notify RN   Fasting lipid panel     Status: None   Collection Time: 02/24/14  5:14 AM  Result Value Ref Range   Cholesterol 141 0 - 200 mg/dL   Triglycerides 63 <150 mg/dL   HDL 90 >39 mg/dL   Total CHOL/HDL Ratio 1.6 RATIO   VLDL 13 0 - 40 mg/dL   LDL Cholesterol 38 0 - 99 mg/dL    Comment:        Total Cholesterol/HDL:CHD Risk Coronary Heart Disease Risk Table                     Men   Women  1/2 Average Risk   3.4   3.3  Average Risk       5.0   4.4  2 X Average Risk   9.6   7.1  3 X Average Risk  23.4   11.0        Use the calculated Patient Ratio above and the CHD Risk Table to determine the patient's CHD Risk.        ATP III CLASSIFICATION (LDL):  <100     mg/dL   Optimal  100-129  mg/dL   Near or Above                    Optimal  130-159  mg/dL   Borderline  160-189  mg/dL   High  >190     mg/dL   Very High   Glucose, capillary     Status: Abnormal   Collection Time: 02/24/14  7:53 AM  Result Value Ref Range   Glucose-Capillary 198 (H) 70 - 99  mg/dL   Comment 1 Notify RN   MRSA PCR Screening     Status: None   Collection Time: 02/24/14  8:24 AM  Result Value Ref Range   MRSA by PCR NEGATIVE NEGATIVE    Comment:        The GeneXpert MRSA Assay (FDA approved for NASAL specimens only), is one component of a comprehensive MRSA colonization surveillance program. It is not intended to diagnose MRSA infection nor to guide or monitor treatment for MRSA infections.   Basic metabolic panel     Status: Abnormal   Collection Time: 02/24/14  9:48 AM  Result Value Ref Range   Sodium 133 (L) 137 - 147 mEq/L   Potassium 4.0 3.7 - 5.3 mEq/L   Chloride 92 (L) 96 - 112 mEq/L   CO2 28 19 - 32 mEq/L   Glucose, Bld 201 (H) 70 - 99 mg/dL   BUN 20 6 - 23 mg/dL   Creatinine, Ser 1.31 (H) 0.50 - 1.10 mg/dL  Calcium 9.0 8.4 - 10.5 mg/dL   GFR calc non Af Amer 37 (L) >90 mL/min   GFR calc Af Amer 43 (L) >90 mL/min    Comment: (NOTE) The eGFR has been calculated using the CKD EPI equation. This calculation has not been validated in all clinical situations. eGFR's persistently <90 mL/min signify possible Chronic Kidney Disease.    Anion gap 13 5 - 15  CBC     Status: Abnormal   Collection Time: 02/24/14  9:48 AM  Result Value Ref Range   WBC 8.2 4.0 - 10.5 K/uL   RBC 3.66 (L) 3.87 - 5.11 MIL/uL   Hemoglobin 10.0 (L) 12.0 - 15.0 g/dL   HCT 30.9 (L) 36.0 - 46.0 %   MCV 84.4 78.0 - 100.0 fL   MCH 27.3 26.0 - 34.0 pg   MCHC 32.4 30.0 - 36.0 g/dL   RDW 18.0 (H) 11.5 - 15.5 %   Platelets 138 (L) 150 - 400 K/uL  Protime-INR     Status: None   Collection Time: 02/24/14  9:48 AM  Result Value Ref Range   Prothrombin Time 15.0 11.6 - 15.2 seconds   INR 1.16 0.00 - 1.49  Glucose, capillary     Status: Abnormal   Collection Time: 02/24/14 12:31 PM  Result Value Ref Range   Glucose-Capillary 229 (H) 70 - 99 mg/dL   Comment 1 Notify RN   Glucose, capillary     Status: Abnormal   Collection Time: 02/24/14  4:28 PM  Result Value Ref Range    Glucose-Capillary 249 (H) 70 - 99 mg/dL   Comment 1 Notify RN     Studies/Results: BRAIN MRI CLINICAL DATA: Aphasia. Altered mental status. Fall 1 week ago.  EXAM: MRI HEAD WITHOUT CONTRAST  MRA HEAD WITHOUT CONTRAST  TECHNIQUE: Multiplanar, multiecho pulse sequences of the brain and surrounding structures were obtained without intravenous contrast. Angiographic images of the head were obtained using MRA technique without contrast.  COMPARISON: CT 02/21/2014.  FINDINGS: MRI HEAD FINDINGS  Generalized atrophy. Negative for hydrocephalus. Mild chronic white matter changes consistent with microvascular ischemia.  Negative for acute infarct.  Negative for mass lesion  Microhemorrhage right cerebellum. No other areas of hemorrhage or fluid collection.  Paranasal sinuses clear.  MRA HEAD FINDINGS  Right vertebral artery dominant and patent to the basilar. Left vertebral artery ends in PICA and is hypoplastic distally. Right PICA also patent. Basilar widely patent. Superior cerebellar and posterior cerebral arteries are patent. Patent posterior communicating artery bilaterally which small.  Internal carotid artery patent bilaterally without stenosis. Anterior and middle cerebral arteries widely patent  Negative for cerebral aneurysm.  IMPRESSION: Atrophy and chronic microvascular ischemic change. No acute infarct.  Negative MRA head.    Wajiha Versteeg A. Merlene Laughter, M.D.  Diplomate, Tax adviser of Psychiatry and Neurology ( Neurology). 02/24/2014, 6:29 PM

## 2014-02-24 NOTE — Clinical Social Work Psychosocial (Signed)
Clinical Social Work Department BRIEF PSYCHOSOCIAL ASSESSMENT 02/24/2014  Patient:  Claudia [name], Claudia [name]     Account Number:  [id number]     Admit date:  02/23/2014  Clinical Social Worker:  Wyatt Haste  Date/Time:  02/24/2014 10:47 AM  Referred by:  CSW  Date Referred:  02/24/2014 Referred for  SNF Placement   Other Referral:   Interview type:  Patient Other interview type:   left voicemail for son, Claudia [name]    PSYCHOSOCIAL DATA Living Status:  FACILITY Admitted from facility:  [facility] Level of care:  [place] Primary support name:  Claudia [name] Primary support relationship to patient:  CHILD, ADULT Degree of support available:   adequate per pt    CURRENT CONCERNS Current Concerns  Post-Acute Placement   Other Concerns:    SOCIAL WORK ASSESSMENT / PLAN CSW met with pt at bedside. Pt alert and oriented x3. She has been a resident at [facility] since September for rehab. Pt reports her son, Claudia [name] is her best support person and he visits about weekly. She would like to return to [facility] at d/c. Pt requests that CSW speak with Claudia [name] as she states, "I don't make good decisions anymore." CSW left voicemail for Claudia [name] requesting return call. Per Claudia [name] at facility, pt was making good progress with PT and they were going to start talking about d/c planning soon. CSW spoke with Claudia [name] at [facility]) and re-authorization will be needed. CSW requested PT evaluation.   Assessment/plan status:  Psychosocial Support/Ongoing Assessment of Needs Other assessment/ plan:   Information/referral to community resources:   [facility]    PATIENT'S/FAMILY'S RESPONSE TO PLAN OF CARE: Pt reports she likes [facility] and plans to return.       [name], [name]

## 2014-02-24 NOTE — Progress Notes (Signed)
[facility] PROGRESS NOTE  Claudia [name] QQV:[id number] DOB: 26-Jul-1931 DOA: 02/23/2014 PCP: Jenna Luo TOM, MD  Assessment/Plan: TIA (transient ischemic attack) - symptoms resolved. No aphasia and mentation appears to be at baseline. Concerns given aphasia . No events on tele. MRI with no acute infarct. Echo results pending. Carotid doppler with minimal bilateral carotid bifurcation plaque. No flow limiting stenosis. Vertebrals are patent with antegrade flow. Lipid panel within limits of normal. Hg A1C 8.8. Home meds include 81mg  asa as well as statin. No coumadin per cardiology as she is fall risk per Lucianne Lei Tright note 01/29/14  Await neurology input. PT recommending SNF  Active Problems:  UTI (urinary tract infection): likely responsible for symptoms. Await culture. Continue rocephin day #2.  Currently afebrile and non-toxic.   Hyponatremia: hx of same. Improving. Chart review indicates baseline range 130-135. Currently in that range. Will monitor   Atrial fibrillation: continue home meds. Warfarin stopped by cards last month due to fall risk. INR 1.1   GERD -  Stable at baseline. Protonix   CAD (coronary artery disease), native coronary artery. Denies chest pain. Troponin negative. No events on tele.  Pt on aspirin and on statin   Benign hypertension: controlled. continue B blocker on board   DM (diabetes mellitus), type 2.  Hg 8.8.  SSI  Chronic renal failure III: appears stable at baseline. Will monitor.    CHF (congestive heart failure): appears compensated. . Continue home regimen.   Code Status: DNI Family Communication: none present Disposition Plan: back to facility   Consultants:  none  Procedures:    Antibiotics:  Rocephin day #2  HPI/Subjective: Awake alert. Denies pain/discomfort.   Objective: Filed Vitals:   02/24/14 1540  BP: 150/65  Pulse: 72  Temp: 98 F (36.7 C)  Resp: 20    Intake/Output Summary (Last 24 hours) at  02/24/14 1600 Last data filed at 02/24/14 1345  Gross per 24 hour  Intake    240 ml  Output    300 ml  Net    -60 ml   Filed Weights   02/23/14 1812 02/23/14 2257  Weight: 85.276 kg (188 lb) 86.864 kg (191 lb 8 oz)    Exam:   General:  Well nourished appears comfortable  Cardiovascular: irregularly irregular. No m/g/r trace LE edema  Respiratory: normal effort somewhat shallow BS clear bilaterally no wheeze or crackles  Abdomen: obese soft +BS non-tender to palpation   Musculoskeletal: no clubbing or cyanosis   Data Reviewed: Basic Metabolic Panel:  Recent Labs Lab 02/23/14 1915 02/24/14 0948  NA 131* 133*  K 3.8 4.0  CL 89* 92*  CO2 26 28  GLUCOSE 252* 201*  BUN 24* 20  CREATININE 1.43* 1.31*  CALCIUM 9.1 9.0   Liver Function Tests:  Recent Labs Lab 02/23/14 1915  AST 12  ALT 9  ALKPHOS 100  BILITOT 0.9  PROT 6.9  ALBUMIN 2.9*   No results for input(s): LIPASE, AMYLASE in the last 168 hours. No results for input(s): AMMONIA in the last 168 hours. CBC:  Recent Labs Lab 02/23/14 1915 02/24/14 0948  WBC 7.7 8.2  NEUTROABS 6.2  --   HGB 10.5* 10.0*  HCT 32.0* 30.9*  MCV 84.4 84.4  PLT 167 138*   Cardiac Enzymes:  Recent Labs Lab 02/23/14 1915  TROPONINI <0.30   BNP (last 3 results)  Recent Labs  12/29/13 0340 12/30/13 0255 02/23/14 1915  PROBNP 10783.0* 12014.0* 16625.0*   CBG:  Recent Labs Lab 02/23/14 2350  02/24/14 0753 02/24/14 1231  GLUCAP 206* 198* 229*    No results found for this or any previous visit (from the past 240 hour(s)).   Studies: Dg Chest 2 View  02/23/2014   CLINICAL DATA:  Confusion  EXAM: CHEST  2 VIEW  COMPARISON:  01/08/2014  FINDINGS: Evidence of median sternotomy, coronary stent placement, and mitral valvuloplasty. Heart size at upper limits of normal. Left basilar scarring is stable. Mild central vascular congestion is noted. Trace superimposed left pleural fluid is present.  IMPRESSION: Central  vascular congestion without overt edema.  Trace left pleural fluid superimposed on left basilar scarring.   Electronically Signed   By: Conchita Paris M.D.   On: 02/23/2014 20:07   Ct Head Wo Contrast  02/23/2014   CLINICAL DATA:  Altered mental status.  Fall 1 week ago.  EXAM: CT HEAD WITHOUT CONTRAST  TECHNIQUE: Contiguous axial images were obtained from the base of the skull through the vertex without intravenous contrast.  COMPARISON:  Head CT scan 12/06/2013.  FINDINGS: There is no evidence of acute intracranial abnormality including hemorrhage, infarct, mass lesion, mass effect, midline shift or abnormal extra-axial fluid collection. Atrophy and chronic microvascular ischemic change are stable in appearance. Imaged paranasal sinuses and mastoid air cells are clear.  IMPRESSION: No acute abnormality.  Atrophy and chronic microvascular ischemic change   Electronically Signed   By: Inge Rise M.D.   On: 02/23/2014 20:17   Mr Jodene Nam Head Wo Contrast  02/24/2014   CLINICAL DATA:  Aphasia.  Altered mental status.  Fall 1 week ago.  EXAM: MRI HEAD WITHOUT CONTRAST  MRA HEAD WITHOUT CONTRAST  TECHNIQUE: Multiplanar, multiecho pulse sequences of the brain and surrounding structures were obtained without intravenous contrast. Angiographic images of the head were obtained using MRA technique without contrast.  COMPARISON:  CT 02/21/2014.  FINDINGS: MRI HEAD FINDINGS  Generalized atrophy. Negative for hydrocephalus. Mild chronic white matter changes consistent with microvascular ischemia.  Negative for acute infarct.  Negative for mass lesion  Microhemorrhage right cerebellum. No other areas of hemorrhage or fluid collection.  Paranasal sinuses clear.  MRA HEAD FINDINGS  Right vertebral artery dominant and patent to the basilar. Left vertebral artery ends in PICA and is hypoplastic distally. Right PICA also patent. Basilar widely patent. Superior cerebellar and posterior cerebral arteries are patent. Patent  posterior communicating artery bilaterally which small.  Internal carotid artery patent bilaterally without stenosis. Anterior and middle cerebral arteries widely patent  Negative for cerebral aneurysm.  IMPRESSION: Atrophy and chronic microvascular ischemic change. No acute infarct.  Negative MRA head.   Electronically Signed   By: Franchot Gallo M.D.   On: 02/24/2014 09:20   Mri Brain Without Contrast  02/24/2014   CLINICAL DATA:  Aphasia.  Altered mental status.  Fall 1 week ago.  EXAM: MRI HEAD WITHOUT CONTRAST  MRA HEAD WITHOUT CONTRAST  TECHNIQUE: Multiplanar, multiecho pulse sequences of the brain and surrounding structures were obtained without intravenous contrast. Angiographic images of the head were obtained using MRA technique without contrast.  COMPARISON:  CT 02/21/2014.  FINDINGS: MRI HEAD FINDINGS  Generalized atrophy. Negative for hydrocephalus. Mild chronic white matter changes consistent with microvascular ischemia.  Negative for acute infarct.  Negative for mass lesion  Microhemorrhage right cerebellum. No other areas of hemorrhage or fluid collection.  Paranasal sinuses clear.  MRA HEAD FINDINGS  Right vertebral artery dominant and patent to the basilar. Left vertebral artery ends in PICA and is hypoplastic distally. Right PICA  also patent. Basilar widely patent. Superior cerebellar and posterior cerebral arteries are patent. Patent posterior communicating artery bilaterally which small.  Internal carotid artery patent bilaterally without stenosis. Anterior and middle cerebral arteries widely patent  Negative for cerebral aneurysm.  IMPRESSION: Atrophy and chronic microvascular ischemic change. No acute infarct.  Negative MRA head.   Electronically Signed   By: Franchot Gallo M.D.   On: 02/24/2014 09:20   US Carotid Duplex Bilateral  02/24/2014   CLINICAL DATA:  A trace hip.  EXAM: BILATERAL CAROTID DUPLEX ULTRASOUND  TECHNIQUE: Pearline Cables scale imaging, color Doppler and duplex ultrasound  were performed of bilateral carotid and vertebral arteries in the neck.  COMPARISON:  None.  FINDINGS: Criteria: Quantification of carotid stenosis is based on velocity parameters that correlate the residual internal carotid diameter with NASCET-based stenosis levels, using the diameter of the distal internal carotid lumen as the denominator for stenosis measurement.  The following velocity measurements were obtained:  RIGHT  ICA:  67/14 cm/sec  CCA:  49/7 cm/sec  SYSTOLIC ICA/CCA RATIO:  1.2  DIASTOLIC ICA/CCA RATIO:  2.2  ECA:  60 cm/sec  LEFT  ICA:   68/13 cm/sec  CCA:  61/ 11 cm/sec  SYSTOLIC ICA/CCA RATIO:  1.1  DIASTOLIC ICA/CCA RATIO:  1.2  ECA:  57 cm/sec  RIGHT CAROTID ARTERY: Minimal plaque right carotid bifurcation. No flow limiting stenosis.  RIGHT VERTEBRAL ARTERY:  Patent with antegrade flow.  LEFT CAROTID ARTERY: Minimal plaque left carotid bifurcation. No flow limiting stenosis.  LEFT VERTEBRAL ARTERY:  Patent with antegrade flow.  IMPRESSION: Minimal bilateral carotid bifurcation plaque. No flow limiting stenosis. Vertebrals are patent with antegrade flow.   Electronically Signed   By: Marcello Moores  Register   On: 02/24/2014 09:57    Scheduled Meds: .  stroke: mapping our early stages of recovery book   Does not apply Once  . aspirin  325 mg Oral Daily  . cefTRIAXone (ROCEPHIN)  IV  1 g Intravenous Q24H  . citalopram  20 mg Oral Daily  . feeding supplement (GLUCERNA SHAKE)  237 mL Oral TID WC  . fluticasone  1 spray Each Nare Daily  . Influenza vac split quadrivalent PF  0.5 mL Intramuscular Once  . insulin aspart  0-15 Units Subcutaneous TID WC  . insulin aspart  0-5 Units Subcutaneous QHS  . insulin detemir  10 Units Subcutaneous q1800  . metoprolol tartrate  25 mg Oral BID  . pantoprazole (PROTONIX) IV  40 mg Intravenous Q24H  . simvastatin  20 mg Oral QPM  . sodium chloride  1 g Oral BID  . torsemide  20 mg Oral Daily   Continuous Infusions:   Active Problems:   GERD   CAD  (coronary artery disease), native coronary artery   Benign hypertension   DM (diabetes mellitus), type 2   CHF (congestive heart failure)   TIA (transient ischemic attack)   S/P mitral valve replacement with bioprosthetic valve   Atrial fibrillation   UTI (urinary tract infection)   Altered mental status    Time spent: 40 minutes    [facility] Pager [phone]. If 7PM-7AM, please contact night-coverage at [email or web address], password [facility] 02/24/2014, 4:00 PM  LOS: 1 day

## 2014-02-24 NOTE — Progress Notes (Signed)
Patient has order for Aspirin but has an intolerance to high doses (per patient).  Notified MD.

## 2014-02-24 NOTE — Progress Notes (Signed)
UR completed 

## 2014-02-24 NOTE — Evaluation (Signed)
Physical Therapy Evaluation Patient Details Name: Claudia [name] MRN: [id number] DOB: 04/03/1931 Today's Date: 02/24/2014   History of Present Illness  Pt is admitted from [facility] due to a TIA with aphasia.  She has been at [facility] since September of this year and it is unclear to me as to the reason for her going there.  She has a past medical history of HTN, CAD and Afib.  Pt is unable to give a clear timeline of events but she reports that she ambulates with a walker at [facility].  She does not appear to have expressive aphasia today but she reports that she occasionally has work finding difficulties.  Her comprehension appears good.  Pt states that she has had a stroke in the past but this is not relected in the chart.  Clinical Impression   Pt was very sleepy at the time of my visit but we were successful in getting her awake for an evaluation.  She had no other complaints other than not sleeping well last night and is very tired.  She was found to have definate right sided weakness in the right LE as  Compared to the LLE.  She needed assist with transfer OOB and to the walker.  Her gait was unsteady with right sided weakness being evident during gait.  She needed stabilization by the therapist x2.  I do not have a clear vision of her prior functional status at [facility] but my guess is that it is poorer than current status.  She will need to return for continuation of Rehab as she is a high fall risk at this time.    Follow Up Recommendations SNF    Equipment Recommendations  None recommended by PT    Recommendations for Other Services   none    Precautions / Restrictions Precautions Precautions: Fall Restrictions Weight Bearing Restrictions: No      Mobility  Bed Mobility Overal bed mobility: Needs Assistance Bed Mobility: Supine to Sit     Supine to sit: Mod assist;HOB elevated     General bed mobility comments: Pt had difficulty getting to the EOB and some of this may be  related to softness of the mattress...   Transfers Overall transfer level: Needs assistance Equipment used: Rolling walker (2 wheeled) Transfers: Sit to/from Stand Sit to Stand: Min assist         General transfer comment: pt needed max assist to stand up from a commode as it is fairly low to the floor  Ambulation/Gait Ambulation/Gait assistance: Min assist Ambulation Distance (Feet): 60 Feet Assistive device: Rolling walker (2 wheeled) Gait Pattern/deviations: Decreased step length - right;Decreased weight shift to right;Drifts right/left;Decreased dorsiflexion - right   Gait velocity interpretation: Below normal speed for age/gender General Gait Details: gait with walker is unsteady and pt tends to lose balance backwards and during turns she gets outside of the walker and loses her balance  Stairs            Wheelchair Mobility    Modified Rankin (Stroke Patients Only)       Balance Overall balance assessment: Needs assistance Sitting-balance support: No upper extremity supported;Feet supported Sitting balance-Leahy Scale: Good     Standing balance support: Bilateral upper extremity supported;During functional activity Standing balance-Leahy Scale: Fair                               Pertinent Vitals/Pain Pain Assessment: No/denies pain  Home Living Family/patient expects to be discharged to:: Skilled nursing facility                      Prior Function Level of Independence:  (unknown)         Comments: pt states that she uses a rolling walker     Hand Dominance   Dominant Hand: Right    Extremity/Trunk Assessment               Lower Extremity Assessment: RLE deficits/detail RLE Deficits / Details: RLE is 1/2 grade weaker on manual muscle test  than left in all muscle groups (3-/5) and functionally it is 1 grade weaker    Cervical / Trunk Assessment: Kyphotic  Communication   Communication: No difficulties   Cognition Arousal/Alertness: Awake/alert Behavior During Therapy: WFL for tasks assessed/performed Overall Cognitive Status: Within Functional Limits for tasks assessed                      General Comments      Exercises        Assessment/Plan    PT Assessment All further PT needs can be met in the next venue of care  PT Diagnosis Difficulty walking;Abnormality of gait;Generalized weakness;Hemiplegia dominant side   PT Problem List Decreased strength;Decreased activity tolerance;Decreased balance;Decreased mobility  PT Treatment Interventions     PT Goals (Current goals can be found in the Care Plan section) Acute Rehab PT Goals PT Goal Formulation: All assessment and education complete, DC therapy    Frequency     Barriers to discharge  none      Co-evaluation               End of Session Equipment Utilized During Treatment: Gait belt Activity Tolerance: Patient limited by fatigue Patient left: in chair;with call bell/phone within reach;with chair alarm set;with family/visitor present Nurse Communication: Mobility status    Functional Assessment Tool Used: clinical judgement Functional Limitation: Mobility: Walking and moving around Mobility: Walking and Moving Around Current Status (K8768): At least 40 percent but less than 60 percent impaired, limited or restricted Mobility: Walking and Moving Around Goal Status [phone]): At least 40 percent but less than 60 percent impaired, limited or restricted Mobility: Walking and Moving Around Discharge Status [phone]): At least 40 percent but less than 60 percent impaired, limited or restricted    Time: 1155-1224 PT Time Calculation (min) (ACUTE ONLY): 29 min   Charges:   PT Evaluation $Initial PT Evaluation Tier I: 1 Procedure     PT G Codes:   Functional Assessment Tool Used: clinical judgement Functional Limitation: Mobility: Walking and moving around    [place] 02/24/2014, 12:44 PM

## 2014-02-24 NOTE — Care Management Note (Unsigned)
    Page 1 of 1   02/24/2014     3:35:52 PM CARE MANAGEMENT NOTE 02/24/2014  Patient:  Claudia [name], Claudia [name]   Account Number:  [id number]  Date Initiated:  02/24/2014  Documentation initiated by:  Vladimir Creeks  Subjective/Objective Assessment:   Admitted with UTI, and AMS, r/o TIA. Pt is from [place], and will return there at D/C     Action/Plan:   CSW aware of pt and will assist with transfere back to SNF at D/C   Anticipated DC Date:  02/26/2014   Anticipated DC Plan:  [place]  In-house referral  Clinical Social Worker      DC Planning Services  CM consult      Choice offered to / List presented to:             Status of service:  Completed, signed off Medicare Important Message given?   (If response is "NO", the following Medicare IM given date fields will be blank) Date Medicare IM given:   Medicare IM given by:   Date Additional Medicare IM given:   Additional Medicare IM given by:    Discharge Disposition:    Per UR Regulation:  Reviewed for med. necessity/level of care/duration of stay  If discussed at [place] of Stay Meetings, dates discussed:    Comments:  02/24/14 [place] RN/CM

## 2014-02-24 NOTE — Progress Notes (Signed)
ANTIBIOTIC CONSULT NOTE - INITIAL  Pharmacy Consult for Rocephin Indication: UTI  Allergies  Allergen Reactions  . Aspirin Other (See Comments)    High doses caused stomach bleeds    Patient Measurements: Height: 5\' 4"  (162.6 cm) Weight: 191 lb 8 oz (86.864 kg) IBW/kg (Calculated) : 54.7  Vital Signs: Temp: 98 F (36.7 C) (12/21 0602) Temp Source: Oral (12/21 0602) BP: 162/59 mmHg (12/21 0602) Pulse Rate: 69 (12/21 0602) Intake/Output from previous day:   Intake/Output from this shift: Total I/O In: 120 [P.O.:120] Out: -   Labs:  Recent Labs  02/23/14 1915 02/24/14 0948  WBC 7.7 8.2  HGB 10.5* 10.0*  PLT 167 138*  CREATININE 1.43* 1.31*   Estimated Creatinine Clearance: 36.6 mL/min (by C-G formula based on Cr of 1.31). No results for input(s): VANCOTROUGH, VANCOPEAK, VANCORANDOM, GENTTROUGH, GENTPEAK, GENTRANDOM, TOBRATROUGH, TOBRAPEAK, TOBRARND, AMIKACINPEAK, AMIKACINTROU, AMIKACIN in the last 72 hours.   Microbiology: No results found for this or any previous visit (from the past 720 hour(s)).  Medical History: Past Medical History  Diagnosis Date  . GERD (gastroesophageal reflux disease)   . PUD (peptic ulcer disease)   . Anemia   . Hypertension   . S/P endoscopy Aug 2011    3 superficial gastric ulcers, NSAID-induced  . S/P colonoscopy Sept 2011    left-sided diverticula, tubular adenoma  . Coronary artery disease   . Shortness of breath   . Diabetes mellitus     insulin dependent  . Headache(784.0)     rare migraines  . Cancer     hx of skin cancer  . Arthritis   . Osteopenia   . Hypercholesterolemia   . SIADH (syndrome of inappropriate ADH production)   . CHF (congestive heart failure)   . Myocardial infarction 2013   Anti-infectives    Start     Dose/Rate Route Frequency Ordered Stop   02/24/14 2200  cefTRIAXone (ROCEPHIN) 1 g in dextrose 5 % 50 mL IVPB - Premix     1 g100 mL/hr over 30 Minutes Intravenous Every 24 hours 02/24/14 0857      02/23/14 2145  cefTRIAXone (ROCEPHIN) 1 g in dextrose 5 % 50 mL IVPB     1 g100 mL/hr over 30 Minutes Intravenous  Once 02/23/14 2131 02/23/14 2227     Assessment: 80yo female with UTI on urinalysis.  Asked to initiate Rocephin.  1st dose given on admission.  Goal of Therapy:  Eradicate infection. Avoid adverse effects  Plan:  Rocephin 1gm IV q24hrs Monitor labs, progress, and cultures Deescalate abx to PO when appropriate.    Hart Robinsons A 02/24/2014,10:57 AM

## 2014-02-24 NOTE — Progress Notes (Signed)
Inpatient Diabetes Program Recommendations  AACE/ADA: New Consensus Statement on Inpatient Glycemic Control (2013)  Target Ranges:  Prepandial:   less than 140 mg/dL      Peak postprandial:   less than 180 mg/dL (1-2 hours)      Critically ill patients:  140 - 180 mg/dL   Results for LATISA, BELAY (MRN [id number]) as of 02/24/2014 08:38  Ref. Range 02/23/2014 23:50 02/24/2014 07:53  Glucose-Capillary Latest Range: 70-99 mg/dL 206 (H) 198 (H)   Diabetes history: DM2 Outpatient Diabetes medications: Novolog 1-9 units TID with meals, Lantus 20 units BID (listed on home medication list with notation that patient has not been taking) Current orders for Inpatient glycemic control: Novolog 0-15 units AC, Novolog 0-5 units HS  Inpatient Diabetes Program Recommendations Insulin - Basal: Fasting glucose 198 mg/dl this morning. May want to consider ordering low dose basal insulin.   Thanks, Barnie Alderman, RN, MSN, CCRN, CDE Diabetes Coordinator Inpatient Diabetes Program [phone] (Team Pager) [phone] (AP office) [phone] [facility] office)

## 2014-02-24 NOTE — Progress Notes (Signed)
Contacted MD about intolerance to aspirin. 162 mg was ordered, patient takes 81 mg at home. MD made aware of dosage and stated it was okay to give. Medication dosage will be reevaluated if any further questions. Will continue to monitor patient.  Candice Camp, RN

## 2014-02-25 ENCOUNTER — Telehealth: Payer: Self-pay | Admitting: *Deleted

## 2014-02-25 ENCOUNTER — Encounter: Payer: Commercial Managed Care - HMO | Admitting: Cardiothoracic Surgery

## 2014-02-25 LAB — BASIC METABOLIC PANEL
Anion gap: 8 (ref 5–15)
BUN: 20 mg/dL (ref 6–23)
CHLORIDE: 95 meq/L — AB (ref 96–112)
CO2: 28 mmol/L (ref 19–32)
Calcium: 8.5 mg/dL (ref 8.4–10.5)
Creatinine, Ser: 1.31 mg/dL — ABNORMAL HIGH (ref 0.50–1.10)
GFR calc Af Amer: 43 mL/min — ABNORMAL LOW (ref >90)
GFR calc non Af Amer: 37 mL/min — ABNORMAL LOW (ref >90)
Glucose, Bld: 113 mg/dL — ABNORMAL HIGH (ref 70–99)
POTASSIUM: 3.3 mmol/L — AB (ref 3.5–5.1)
Sodium: 131 mmol/L — ABNORMAL LOW (ref 135–145)

## 2014-02-25 LAB — CBC
HEMATOCRIT: 28.7 % — AB (ref 36.0–46.0)
HEMOGLOBIN: 9.5 g/dL — AB (ref 12.0–15.0)
MCH: 27.9 pg (ref 26.0–34.0)
MCHC: 33.1 g/dL (ref 30.0–36.0)
MCV: 84.4 fL (ref 78.0–100.0)
PLATELETS: 130 10*3/uL — AB (ref 150–400)
RBC: 3.4 MIL/uL — AB (ref 3.87–5.11)
RDW: 18.3 % — ABNORMAL HIGH (ref 11.5–15.5)
WBC: 6.2 10*3/uL (ref 4.0–10.5)

## 2014-02-25 LAB — GLUCOSE, CAPILLARY
GLUCOSE-CAPILLARY: 206 mg/dL — AB (ref 70–99)
Glucose-Capillary: 112 mg/dL — ABNORMAL HIGH (ref 70–99)

## 2014-02-25 LAB — TSH: TSH: 1.008 u[IU]/mL (ref 0.350–4.500)

## 2014-02-25 LAB — VITAMIN B12: Vitamin B-12: 819 pg/mL (ref 211–911)

## 2014-02-25 LAB — HOMOCYSTEINE: Homocysteine: 14.3 umol/L (ref 4.0–15.4)

## 2014-02-25 MED ORDER — POTASSIUM CHLORIDE CRYS ER 20 MEQ PO TBCR: 40.0000 meq | EXTENDED_RELEASE_TABLET | Freq: Once | ORAL | Status: DC

## 2014-02-25 MED ORDER — CIPROFLOXACIN HCL 500 MG PO TABS: 500.0000 mg | ORAL_TABLET | Freq: Two times a day (BID) | ORAL | Status: DC

## 2014-02-25 MED ORDER — POTASSIUM CHLORIDE CRYS ER 20 MEQ PO TBCR
40.0000 meq | EXTENDED_RELEASE_TABLET | Freq: Once | ORAL | Status: AC
  Administered 2014-02-25: 40 meq via ORAL
  Filled 2014-02-25: qty 2

## 2014-02-25 MED ORDER — ASPIRIN 81 MG PO CHEW: 162.0000 mg | CHEWABLE_TABLET | Freq: Every day | ORAL | Status: DC

## 2014-02-25 NOTE — Discharge Summary (Signed)
Physician Discharge Summary  Claudia [name] QMV:[id number] DOB: [birth year] DOA: 02/23/2014  PCP: Odette Fraction, MD  Admit date: 02/23/2014 Discharge date: 02/25/2014  Time spent: 40 minutes  Recommendations for Outpatient Follow-up:  1. Dr Merlene Laughter to arrange holter monitor. Follow Vit B12 and Homocysteine. 2. Follow up withPCP  1-2 weeks 3. Discharge back to [facility]  Discharge Diagnoses:  Principal Problem:   Altered mental status Active Problems:   GERD   CAD (coronary artery disease), native coronary artery   Hyponatremia   Benign hypertension   DM (diabetes mellitus), type 2   CHF (congestive heart failure)   TIA (transient ischemic attack)   S/P mitral valve replacement with bioprosthetic valve   Atrial fibrillation   CKD (chronic kidney disease) stage 4, GFR 15-29 ml/min   UTI (urinary tract infection)   Aphasia   Urinary tract infectious disease   Discharge Condition: stable  Diet recommendation: heart healthy  Filed Weights   02/23/14 1812 02/23/14 2257  Weight: 85.276 kg (188 lb) 86.864 kg (191 lb 8 oz)    History of present illness:  Claudia [name] is a 80 y.o. female  With history of TIA, HTN, CAD, Atrial fibrillation, Bioprosthetic valve, GERD, and GERD. Pt presented to the hospital on 02/23/14 after having episode of aphasia while at nursing home. Symptoms quickly resolved in ED. Patient was brought in by son given symptoms. Patient poor historian and history  obtained mostly from son. CT of head in ED was negative for stroke.  Given concerns for aphasia we were consulted for medical evaluation and recommendations. UTI on urinalysis   Hospital Course:  TIA (transient ischemic attack) - symptoms resolved quickly. No further aphasia and mentation returned to  baseline. Concerns given aphasia . No events on tele. MRI with no acute infarct. Echo results below. Carotid doppler with minimal bilateral carotid bifurcation plaque. No flow limiting  stenosis. Vertebrals are patent with antegrade flow. Lipid panel within limits of normal. Hg A1C 8.8. Home meds include 85m asa as well as statin. No coumadin per cardiology as she is fall risk per VLucianne LeiTright note 01/29/14. Evaluated by neurology who recommends increase in aspirin and 4 week holter monitor. Follow B12 and Homocysteine  Active Problems: UTI (urinary tract infection): likely responsible for symptoms. Received  Rocephin. Cultures cancelled mysteriously. Discharge on cipro. She remained afebrile and non-toxic   Hyponatremia: hx of same. Chart review indicates baseline range 130-135. In that range at discharge.    Atrial fibrillation: continue home meds. Warfarin stopped by cards last month due to fall risk. INR 1.1   GERD: Stable at baseline. Protonix   CAD (coronary artery disease), native coronary artery. Denied chest pain. Troponin negative. No events on tele. Pt on aspirin and on statin   Benign hypertension: controlled. continue B blocker.   DM (diabetes mellitus), type 2. Hg 8.8.   Chronic renal failure III: remained stable at baseline. .    CHF (congestive heart failure): remained compensated.    Procedures:  Left ventricle: The cavity size was normal. Wall thickness was normal. Systolic function was normal. The estimated ejection fraction was in the range of 50% to 55%. - Aortic valve: Moderately calcified annulus. Trileaflet; moderately thickened leaflets. Valve area (VTI): 1.61 cm^2. Valve area (Vmax): 1.68 cm^2. - Mitral valve: 25-mm pericardial tissue valve ([facility] Hospital[facility] Magna Ease valve, serial #[id number] in the MV position. There was no evidence for stenosis. There was no significant regurgitation. Mean gradient (D): 5 mm Hg. Peak gradient (D):  12 mm Hg. - Left atrium: The atrium was moderately dilated. - Right atrium: The atrium was mildly dilated. - Atrial septum: There appears to be a secundum ASD with evidence of left  to right shunting. - Pulmonary arteries: Systolic pressure was moderately increased. PA peak pressure: 44 mm Hg (S). - Technically difficult study.  Consultations:  neurology  Discharge Exam: Filed Vitals:   02/25/14 1057  BP: 133/49  Pulse: 67  Temp: 98 F (36.7 C)  Resp: 18    General: appears comfortable Cardiovascular: irregularly irregular no MGR  Respiratory: normal effort BS clear bilaterally  Discharge Instructions   Discharge Instructions    Diet - low sodium heart healthy    Complete by:  As directed      Discharge instructions    Complete by:  As directed   Take medications as directed     Increase activity slowly    Complete by:  As directed           Current Discharge Medication List    START taking these medications   Details  aspirin 81 MG chewable tablet Chew 2 tablets (162 mg total) by mouth daily.    ciprofloxacin (CIPRO) 500 MG tablet Take 1 tablet (500 mg total) by mouth 2 (two) times daily. Qty: 8 tablet, Refills: 0      CONTINUE these medications which have NOT CHANGED   Details  Amino Acids-Protein Hydrolys (FEEDING SUPPLEMENT, PRO-STAT SUGAR FREE 64,) LIQD Take 30 mLs by mouth 2 (two) times daily.    feeding supplement, GLUCERNA SHAKE, (GLUCERNA SHAKE) LIQD Take 237 mLs by mouth 3 (three) times daily with meals. Refills: 0    fluticasone (FLONASE) 50 MCG/ACT nasal spray Place 1 spray into both nostrils daily. Qty: 16 g, Refills: 2    insulin aspart (NOVOLOG FLEXPEN) 100 UNIT/ML FlexPen Inject 5-10 Units into the skin 3 (three) times daily with meals. 70-120=0    251-300=5 121-150=1  301-350=7 151-200=2  351-400=9 201-250=3  400 or >, call MD  units of insulin Qty: 15 mL, Refills: 11    metoprolol tartrate (LOPRESSOR) 25 MG tablet Take 1 tablet (25 mg total) by mouth 2 (two) times daily.    ondansetron (ZOFRAN) 4 MG tablet Take 4 mg by mouth every 6 (six) hours as needed for nausea or vomiting.    pantoprazole (PROTONIX) 40  MG tablet Take 40 mg by mouth daily.    sennosides-docusate sodium (SENOKOT-S) 8.6-50 MG tablet Take 1 tablet by mouth 2 (two) times daily.    simvastatin (ZOCOR) 20 MG tablet Take 1 tablet (20 mg total) by mouth every evening. Qty: 90 tablet, Refills: 1    sodium chloride 1 G tablet Take 1 g by mouth 2 (two) times daily.    torsemide (DEMADEX) 20 MG tablet Take 20 mg by mouth daily.    acetaminophen (TYLENOL) 500 MG tablet Take 1,000 mg by mouth every 6 (six) hours as needed.    citalopram (CELEXA) 20 MG tablet Take 20 mg by mouth daily.    insulin glargine (LANTUS) 100 UNIT/ML injection Inject 0.2 mLs (20 Units total) into the skin 2 (two) times daily. Qty: 10 mL, Refills: 11    Melatonin 3 MG TABS Take 3 mg by mouth at bedtime as needed (insomnia).    temazepam (RESTORIL) 7.5 MG capsule Take 1 capsule (7.5 mg total) by mouth at bedtime as needed for sleep. Qty: 30 capsule, Refills: 0      STOP taking these medications  aspirin EC 81 MG tablet      linezolid (ZYVOX) 600 MG tablet      B-D ULTRAFINE III SHORT PEN 31G X 8 MM MISC      Blood Glucose Monitoring Suppl (ACCU-CHEK NANO SMARTVIEW) W/DEVICE KIT      demeclocycline (DECLOMYCIN) 150 MG tablet      metroNIDAZOLE (FLAGYL) 500 MG tablet      nystatin cream (MYCOSTATIN)      warfarin (COUMADIN) 4 MG tablet        Allergies  Allergen Reactions  . Aspirin Other (See Comments)    High doses caused stomach bleeds      The results of significant diagnostics from this hospitalization (including imaging, microbiology, ancillary and laboratory) are listed below for reference.    Significant Diagnostic Studies: Dg Chest 2 View  02/23/2014   CLINICAL DATA:  Confusion  EXAM: CHEST  2 VIEW  COMPARISON:  01/08/2014  FINDINGS: Evidence of median sternotomy, coronary stent placement, and mitral valvuloplasty. Heart size at upper limits of normal. Left basilar scarring is stable. Mild central vascular congestion is  noted. Trace superimposed left pleural fluid is present.  IMPRESSION: Central vascular congestion without overt edema.  Trace left pleural fluid superimposed on left basilar scarring.   Electronically Signed   By: Conchita Paris M.D.   On: 02/23/2014 20:07   Ct Head Wo Contrast  02/23/2014   CLINICAL DATA:  Altered mental status.  Fall 1 week ago.  EXAM: CT HEAD WITHOUT CONTRAST  TECHNIQUE: Contiguous axial images were obtained from the base of the skull through the vertex without intravenous contrast.  COMPARISON:  Head CT scan 12/06/2013.  FINDINGS: There is no evidence of acute intracranial abnormality including hemorrhage, infarct, mass lesion, mass effect, midline shift or abnormal extra-axial fluid collection. Atrophy and chronic microvascular ischemic change are stable in appearance. Imaged paranasal sinuses and mastoid air cells are clear.  IMPRESSION: No acute abnormality.  Atrophy and chronic microvascular ischemic change   Electronically Signed   By: Inge Rise M.D.   On: 02/23/2014 20:17   Mr Jodene Nam Head Wo Contrast  02/24/2014   CLINICAL DATA:  Aphasia.  Altered mental status.  Fall 1 week ago.  EXAM: MRI HEAD WITHOUT CONTRAST  MRA HEAD WITHOUT CONTRAST  TECHNIQUE: Multiplanar, multiecho pulse sequences of the brain and surrounding structures were obtained without intravenous contrast. Angiographic images of the head were obtained using MRA technique without contrast.  COMPARISON:  CT 02/21/2014.  FINDINGS: MRI HEAD FINDINGS  Generalized atrophy. Negative for hydrocephalus. Mild chronic white matter changes consistent with microvascular ischemia.  Negative for acute infarct.  Negative for mass lesion  Microhemorrhage right cerebellum. No other areas of hemorrhage or fluid collection.  Paranasal sinuses clear.  MRA HEAD FINDINGS  Right vertebral artery dominant and patent to the basilar. Left vertebral artery ends in PICA and is hypoplastic distally. Right PICA also patent. Basilar widely  patent. Superior cerebellar and posterior cerebral arteries are patent. Patent posterior communicating artery bilaterally which small.  Internal carotid artery patent bilaterally without stenosis. Anterior and middle cerebral arteries widely patent  Negative for cerebral aneurysm.  IMPRESSION: Atrophy and chronic microvascular ischemic change. No acute infarct.  Negative MRA head.   Electronically Signed   By: Franchot Gallo M.D.   On: 02/24/2014 09:20   Mri Brain Without Contrast  02/24/2014   CLINICAL DATA:  Aphasia.  Altered mental status.  Fall 1 week ago.  EXAM: MRI HEAD WITHOUT CONTRAST  MRA HEAD  WITHOUT CONTRAST  TECHNIQUE: Multiplanar, multiecho pulse sequences of the brain and surrounding structures were obtained without intravenous contrast. Angiographic images of the head were obtained using MRA technique without contrast.  COMPARISON:  CT 02/21/2014.  FINDINGS: MRI HEAD FINDINGS  Generalized atrophy. Negative for hydrocephalus. Mild chronic white matter changes consistent with microvascular ischemia.  Negative for acute infarct.  Negative for mass lesion  Microhemorrhage right cerebellum. No other areas of hemorrhage or fluid collection.  Paranasal sinuses clear.  MRA HEAD FINDINGS  Right vertebral artery dominant and patent to the basilar. Left vertebral artery ends in PICA and is hypoplastic distally. Right PICA also patent. Basilar widely patent. Superior cerebellar and posterior cerebral arteries are patent. Patent posterior communicating artery bilaterally which small.  Internal carotid artery patent bilaterally without stenosis. Anterior and middle cerebral arteries widely patent  Negative for cerebral aneurysm.  IMPRESSION: Atrophy and chronic microvascular ischemic change. No acute infarct.  Negative MRA head.   Electronically Signed   By: Franchot Gallo M.D.   On: 02/24/2014 09:20   US Carotid Duplex Bilateral  02/24/2014   CLINICAL DATA:  A trace hip.  EXAM: BILATERAL CAROTID DUPLEX  ULTRASOUND  TECHNIQUE: Pearline Cables scale imaging, color Doppler and duplex ultrasound were performed of bilateral carotid and vertebral arteries in the neck.  COMPARISON:  None.  FINDINGS: Criteria: Quantification of carotid stenosis is based on velocity parameters that correlate the residual internal carotid diameter with NASCET-based stenosis levels, using the diameter of the distal internal carotid lumen as the denominator for stenosis measurement.  The following velocity measurements were obtained:  RIGHT  ICA:  67/14 cm/sec  CCA:  58/5 cm/sec  SYSTOLIC ICA/CCA RATIO:  1.2  DIASTOLIC ICA/CCA RATIO:  2.2  ECA:  60 cm/sec  LEFT  ICA:   68/13 cm/sec  CCA:  61/ 11 cm/sec  SYSTOLIC ICA/CCA RATIO:  1.1  DIASTOLIC ICA/CCA RATIO:  1.2  ECA:  57 cm/sec  RIGHT CAROTID ARTERY: Minimal plaque right carotid bifurcation. No flow limiting stenosis.  RIGHT VERTEBRAL ARTERY:  Patent with antegrade flow.  LEFT CAROTID ARTERY: Minimal plaque left carotid bifurcation. No flow limiting stenosis.  LEFT VERTEBRAL ARTERY:  Patent with antegrade flow.  IMPRESSION: Minimal bilateral carotid bifurcation plaque. No flow limiting stenosis. Vertebrals are patent with antegrade flow.   Electronically Signed   By: Marcello Moores  Register   On: 02/24/2014 09:57    Microbiology: Recent Results (from the past 240 hour(s))  MRSA PCR Screening     Status: None   Collection Time: 02/24/14  8:24 AM  Result Value Ref Range Status   MRSA by PCR NEGATIVE NEGATIVE Final    Comment:        The GeneXpert MRSA Assay (FDA approved for NASAL specimens only), is one component of a comprehensive MRSA colonization surveillance program. It is not intended to diagnose MRSA infection nor to guide or monitor treatment for MRSA infections.      Labs: Basic Metabolic Panel:  Recent Labs Lab 02/23/14 1915 02/24/14 0948 02/25/14 0516  NA 131* 133* 131*  K 3.8 4.0 3.3*  CL 89* 92* 95*  CO2 26 28 28   GLUCOSE 252* 201* 113*  BUN 24* 20 20  CREATININE  1.43* 1.31* 1.31*  CALCIUM 9.1 9.0 8.5   Liver Function Tests:  Recent Labs Lab 02/23/14 1915  AST 12  ALT 9  ALKPHOS 100  BILITOT 0.9  PROT 6.9  ALBUMIN 2.9*   No results for input(s): LIPASE, AMYLASE in the last 168  hours. No results for input(s): AMMONIA in the last 168 hours. CBC:  Recent Labs Lab 02/23/14 1915 02/24/14 0948 02/25/14 0516  WBC 7.7 8.2 6.2  NEUTROABS 6.2  --   --   HGB 10.5* 10.0* 9.5*  HCT 32.0* 30.9* 28.7*  MCV 84.4 84.4 84.4  PLT 167 138* 130*   Cardiac Enzymes:  Recent Labs Lab 02/23/14 1915  TROPONINI <0.30   BNP: BNP (last 3 results)  Recent Labs  12/29/13 0340 12/30/13 0255 02/23/14 1915  PROBNP 10783.0* 12014.0* 16625.0*   CBG:  Recent Labs Lab 02/24/14 1231 02/24/14 1628 02/24/14 2058 02/25/14 0735 02/25/14 1104  GLUCAP 229* 249* 201* 112* 206*       Signed:  Dyanne Carrel M  [facility] 02/25/2014, 1:43 PM

## 2014-02-25 NOTE — Care Management Utilization Note (Signed)
UR complete 

## 2014-02-25 NOTE — Clinical Social Work Note (Signed)
Pt to d/c today back to [facility]. LaSandra with [facility]) notified and faxed authorization to [facility]. Pt and son, Dionne Bucy also aware and agreeable. Arnie to provide transport this afternoon. CSW will fax d/c summary upon completion.  [name], [place]

## 2014-02-25 NOTE — Progress Notes (Signed)
Patient has a wound to left ankle that is packed with guaze and covered with foam dressing. There were no orders present concerning dressing the wound.

## 2014-02-25 NOTE — Progress Notes (Signed)
Report called to [employer] at [facility].  Pt family here to transport to [facility].  Will continue to monitor.

## 2014-02-26 ENCOUNTER — Encounter: Payer: Commercial Managed Care - HMO | Admitting: Cardiothoracic Surgery

## 2014-03-08 DIAGNOSIS — R531 Weakness: Secondary | ICD-10-CM | POA: Diagnosis not present

## 2014-03-08 DIAGNOSIS — R609 Edema, unspecified: Secondary | ICD-10-CM | POA: Diagnosis not present

## 2014-03-08 DIAGNOSIS — E46 Unspecified protein-calorie malnutrition: Secondary | ICD-10-CM | POA: Diagnosis not present

## 2014-03-10 DIAGNOSIS — E46 Unspecified protein-calorie malnutrition: Secondary | ICD-10-CM | POA: Diagnosis not present

## 2014-03-10 DIAGNOSIS — N184 Chronic kidney disease, stage 4 (severe): Secondary | ICD-10-CM | POA: Diagnosis not present

## 2014-03-10 DIAGNOSIS — R069 Unspecified abnormalities of breathing: Secondary | ICD-10-CM | POA: Diagnosis not present

## 2014-03-10 DIAGNOSIS — E871 Hypo-osmolality and hyponatremia: Secondary | ICD-10-CM | POA: Diagnosis not present

## 2014-03-10 DIAGNOSIS — D649 Anemia, unspecified: Secondary | ICD-10-CM | POA: Diagnosis not present

## 2014-03-10 DIAGNOSIS — R609 Edema, unspecified: Secondary | ICD-10-CM | POA: Diagnosis not present

## 2014-03-10 DIAGNOSIS — F05 Delirium due to known physiological condition: Secondary | ICD-10-CM | POA: Diagnosis not present

## 2014-03-10 DIAGNOSIS — G459 Transient cerebral ischemic attack, unspecified: Secondary | ICD-10-CM | POA: Diagnosis not present

## 2014-03-12 DIAGNOSIS — L97829 Non-pressure chronic ulcer of other part of left lower leg with unspecified severity: Secondary | ICD-10-CM | POA: Diagnosis not present

## 2014-03-12 DIAGNOSIS — G459 Transient cerebral ischemic attack, unspecified: Secondary | ICD-10-CM | POA: Diagnosis not present

## 2014-03-12 DIAGNOSIS — N184 Chronic kidney disease, stage 4 (severe): Secondary | ICD-10-CM | POA: Diagnosis not present

## 2014-03-12 DIAGNOSIS — D649 Anemia, unspecified: Secondary | ICD-10-CM | POA: Diagnosis not present

## 2014-03-12 DIAGNOSIS — E871 Hypo-osmolality and hyponatremia: Secondary | ICD-10-CM | POA: Diagnosis not present

## 2014-03-12 DIAGNOSIS — E46 Unspecified protein-calorie malnutrition: Secondary | ICD-10-CM | POA: Diagnosis not present

## 2014-03-12 DIAGNOSIS — F05 Delirium due to known physiological condition: Secondary | ICD-10-CM | POA: Diagnosis not present

## 2014-03-12 DIAGNOSIS — R069 Unspecified abnormalities of breathing: Secondary | ICD-10-CM | POA: Diagnosis not present

## 2014-03-12 DIAGNOSIS — R609 Edema, unspecified: Secondary | ICD-10-CM | POA: Diagnosis not present

## 2014-03-17 DIAGNOSIS — J189 Pneumonia, unspecified organism: Secondary | ICD-10-CM | POA: Diagnosis not present

## 2014-03-17 DIAGNOSIS — R0989 Other specified symptoms and signs involving the circulatory and respiratory systems: Secondary | ICD-10-CM | POA: Diagnosis not present

## 2014-03-17 DIAGNOSIS — F05 Delirium due to known physiological condition: Secondary | ICD-10-CM | POA: Diagnosis not present

## 2014-03-17 DIAGNOSIS — D649 Anemia, unspecified: Secondary | ICD-10-CM | POA: Diagnosis not present

## 2014-03-17 DIAGNOSIS — T814XXA Infection following a procedure, initial encounter: Secondary | ICD-10-CM | POA: Diagnosis not present

## 2014-03-17 DIAGNOSIS — G459 Transient cerebral ischemic attack, unspecified: Secondary | ICD-10-CM | POA: Diagnosis not present

## 2014-03-17 DIAGNOSIS — R609 Edema, unspecified: Secondary | ICD-10-CM | POA: Diagnosis not present

## 2014-03-18 DIAGNOSIS — D649 Anemia, unspecified: Secondary | ICD-10-CM | POA: Diagnosis not present

## 2014-03-18 DIAGNOSIS — G459 Transient cerebral ischemic attack, unspecified: Secondary | ICD-10-CM | POA: Diagnosis not present

## 2014-03-18 DIAGNOSIS — R609 Edema, unspecified: Secondary | ICD-10-CM | POA: Diagnosis not present

## 2014-03-18 DIAGNOSIS — T814XXA Infection following a procedure, initial encounter: Secondary | ICD-10-CM | POA: Diagnosis not present

## 2014-03-18 DIAGNOSIS — J189 Pneumonia, unspecified organism: Secondary | ICD-10-CM | POA: Diagnosis not present

## 2014-03-18 DIAGNOSIS — F05 Delirium due to known physiological condition: Secondary | ICD-10-CM | POA: Diagnosis not present

## 2014-03-19 ENCOUNTER — Encounter: Payer: Self-pay | Admitting: Cardiothoracic Surgery

## 2014-03-19 ENCOUNTER — Ambulatory Visit (INDEPENDENT_AMBULATORY_CARE_PROVIDER_SITE_OTHER): Payer: Commercial Managed Care - HMO | Admitting: Cardiothoracic Surgery

## 2014-03-19 VITALS — BP 129/69 | HR 84 | Resp 20 | Ht 64.0 in | Wt 224.0 lb

## 2014-03-19 DIAGNOSIS — Z954 Presence of other heart-valve replacement: Secondary | ICD-10-CM

## 2014-03-19 DIAGNOSIS — Z952 Presence of prosthetic heart valve: Secondary | ICD-10-CM

## 2014-03-19 DIAGNOSIS — L97829 Non-pressure chronic ulcer of other part of left lower leg with unspecified severity: Secondary | ICD-10-CM | POA: Diagnosis not present

## 2014-03-19 NOTE — Progress Notes (Signed)
PCP is Odette Fraction, MD Referring Provider is Laverda Page, MD  Chief Complaint  Patient presents with  . Routine Post Op    1 month f/u for wound check s/p CABG    HPI: The patient returns for routine followup. The patient had CABG x2 and mitral valve replacement with a biologic valve September 2015 The patient is in a nursing home in [place] [facility] She is maintaining sinus rhythm. Surgical incisions are healed except for vein harvest-exploration site above the left ankle. The patient had poor vein and required allograft CryoVein as well as left IMA graft.  The open area above the left ankle is being treated with daily dressings at the nursing facility. Is not infected. She is not on antibiotics.  The patient has developed bilateral leg edema and is currently on torsemide.  The patient was admitted to the hospital 48 hours just before Christmas for an episode of aphasia-TIA. CT scan was negative.carotid Dopplers were negative. Echocardiogram showed normal LV function with a normal functioning mitral prosthetic valve. No cardiac thrombus. Her aspirin was increased by neurology. She's had no more TIAs.  Past Medical History  Diagnosis Date  . GERD (gastroesophageal reflux disease)   . PUD (peptic ulcer disease)   . Anemia   . Hypertension   . S/P endoscopy Aug 2011    3 superficial gastric ulcers, NSAID-induced  . S/P colonoscopy Sept 2011    left-sided diverticula, tubular adenoma  . Coronary artery disease   . Shortness of breath   . Diabetes mellitus     insulin dependent  . Headache(784.0)     rare migraines  . Cancer     hx of skin cancer  . Arthritis   . Osteopenia   . Hypercholesterolemia   . SIADH (syndrome of inappropriate ADH production)   . CHF (congestive heart failure)   . Myocardial infarction 2013  . Chronic renal failure, stage 3 (moderate)     Past Surgical History  Procedure Laterality Date  . Tonsillectomy    . Appendectomy     . Eye surgery      cataracts  . Cardiac stents  07/19/2011  . Esophagogastroduodenoscopy  10/16/09    normal without barrett's/three superficial gastric ulcers  . Colonoscopy  12/04/09    normal rectum/left-sided diverticula  . Joint replacement  arthroscopy to knee  . Toe amputation Left 2013    left second toe amputation  . Cardiac catheterization  01/22/2013  . Coronary angioplasty  07/2011  . Coronary artery bypass graft N/A 11/08/2013    Procedure: CORONARY ARTERY BYPASS GRAFTING (CABG), on pump, times two, using left internal mammary artery, cryo saphenous vein.;  Surgeon: Ivin Poot, MD;  Location: [place];  Service: Open Heart Surgery;  Laterality: N/A;  LIMA-LAD CRYOVEIN -OM  . Intraoperative transesophageal echocardiogram N/A 11/08/2013    Procedure: INTRAOPERATIVE TRANSESOPHAGEAL ECHOCARDIOGRAM;  Surgeon: Ivin Poot, MD;  Location: [place];  Service: Open Heart Surgery;  Laterality: N/A;  . Mitral valve replacement N/A 11/08/2013    Procedure: MITRAL VALVE (MV) REPLACEMENT;  Surgeon: Ivin Poot, MD;  Location: [place];  Service: Open Heart Surgery;  Laterality: N/A;  #25 MAGNA MITRAL EASE  . Left heart catheterization with coronary angiogram N/A 07/19/2011    Procedure: LEFT HEART CATHETERIZATION WITH CORONARY ANGIOGRAM;  Surgeon: Laverda Page, MD;  Location: [facility] CATH LAB;  Service: Cardiovascular;  Laterality: N/A;  . Left heart catheterization with coronary angiogram N/A 12/21/2011    Procedure:  LEFT HEART CATHETERIZATION WITH CORONARY ANGIOGRAM;  Surgeon: Laverda Page, MD;  Location: [facility] CATH LAB;  Service: Cardiovascular;  Laterality: N/A;  . Left heart catheterization with coronary angiogram N/A 02/20/2012    Procedure: LEFT HEART CATHETERIZATION WITH CORONARY ANGIOGRAM;  Surgeon: Laverda Page, MD;  Location: [facility] CATH LAB;  Service: Cardiovascular;  Laterality: N/A;  . Left heart catheterization with coronary angiogram N/A 09/03/2012    Procedure: LEFT HEART  CATHETERIZATION WITH CORONARY ANGIOGRAM;  Surgeon: Laverda Page, MD;  Location: [facility] CATH LAB;  Service: Cardiovascular;  Laterality: N/A;  . Percutaneous coronary stent intervention (pci-s)  09/03/2012    Procedure: PERCUTANEOUS CORONARY STENT INTERVENTION (PCI-S);  Surgeon: Laverda Page, MD;  Location: [facility] CATH LAB;  Service: Cardiovascular;;  . Left heart catheterization with coronary angiogram N/A 12/04/2012    Procedure: LEFT HEART CATHETERIZATION WITH CORONARY ANGIOGRAM;  Surgeon: Laverda Page, MD;  Location: [facility] CATH LAB;  Service: Cardiovascular;  Laterality: N/A;  . Left heart catheterization with coronary angiogram N/A 01/22/2013    Procedure: LEFT HEART CATHETERIZATION WITH CORONARY ANGIOGRAM;  Surgeon: Laverda Page, MD;  Location: [facility] CATH LAB;  Service: Cardiovascular;  Laterality: N/A;  . Left heart catheterization with coronary angiogram N/A 06/26/2013    Procedure: LEFT HEART CATHETERIZATION WITH CORONARY ANGIOGRAM;  Surgeon: Laverda Page, MD;  Location: [facility] CATH LAB;  Service: Cardiovascular;  Laterality: N/A;  . Left heart catheterization with coronary angiogram N/A 11/05/2013    Procedure: LEFT HEART CATHETERIZATION WITH CORONARY ANGIOGRAM;  Surgeon: Laverda Page, MD;  Location: [facility] CATH LAB;  Service: Cardiovascular;  Laterality: N/A;    No family history on file.  Social History History  Substance Use Topics  . Smoking status: Never Smoker   . Smokeless tobacco: Never Used  . Alcohol Use: No    Current Outpatient Prescriptions  Medication Sig Dispense Refill  . acetaminophen (TYLENOL) 500 MG tablet Take 1,000 mg by mouth every 6 (six) hours as needed.    . Amino Acids-Protein Hydrolys (FEEDING SUPPLEMENT, PRO-STAT SUGAR FREE 64,) LIQD Take 30 mLs by mouth 2 (two) times daily.    Marland Kitchen aspirin 81 MG chewable tablet Chew 2 tablets (162 mg total) by mouth daily.    . feeding supplement, GLUCERNA SHAKE, (GLUCERNA SHAKE) LIQD Take 237 mLs by mouth 3 (three)  times daily with meals.  0  . fluticasone (FLONASE) 50 MCG/ACT nasal spray Place 1 spray into both nostrils daily. 16 g 2  . insulin aspart (NOVOLOG FLEXPEN) 100 UNIT/ML FlexPen Inject 5-10 Units into the skin 3 (three) times daily with meals. 70-120=0    251-300=5 121-150=1  301-350=7 151-200=2  351-400=9 201-250=3  400 or >, call MD  units of insulin (Patient taking differently: Inject 1-9 Units into the skin 3 (three) times daily with meals. Below 70 call MD    251-300=5 121-150=1               301-350=7 151-200=2               351-400=9 201-250=3               400 or >, call MD  units of insulin) 15 mL 11  . insulin glargine (LANTUS) 100 UNIT/ML injection Inject 0.2 mLs (20 Units total) into the skin 2 (two) times daily. 10 mL 11  . Melatonin 3 MG TABS Take 3 mg by mouth at bedtime as needed (insomnia).    . metoprolol tartrate (LOPRESSOR) 25 MG tablet  Take 1 tablet (25 mg total) by mouth 2 (two) times daily.    . ondansetron (ZOFRAN) 4 MG tablet Take 4 mg by mouth every 6 (six) hours as needed for nausea or vomiting.    . pantoprazole (PROTONIX) 40 MG tablet Take 40 mg by mouth daily.    . sennosides-docusate sodium (SENOKOT-S) 8.6-50 MG tablet Take 1 tablet by mouth 2 (two) times daily.    . simvastatin (ZOCOR) 20 MG tablet Take 1 tablet (20 mg total) by mouth every evening. 90 tablet 1  . sodium chloride 1 G tablet Take 1 g by mouth 2 (two) times daily.    . temazepam (RESTORIL) 7.5 MG capsule Take 1 capsule (7.5 mg total) by mouth at bedtime as needed for sleep. 30 capsule 0  . torsemide (DEMADEX) 20 MG tablet Take 20 mg by mouth daily.     No current facility-administered medications for this visit.    Allergies  Allergen Reactions  . Aspirin Other (See Comments)    High doses caused stomach bleeds    Review of systems  The patient is happy at the skilled nursing facility She is currently  In a wheelchair She feels she is being well cared for and appreciates the care she  is receiving by the nursing facility physician  BP 129/69 mmHg  Pulse 84  Resp 20  Ht 5\' 4"  (1.626 m)  Wt 224 lb (101.606 kg)  BMI 38.43 kg/m2  SpO2 94% Physical Exam: Chronically ill elderly female in wheelchair Significant bilateral pedal edema Breathing comfortably Lungs clear Heart rhythm regular No murmur or gallop Open incision about left ankle superficially debrided and Neosporin-Band-Aid dressing applied  Diagnostic Tests: No chest x-ray today  Impression: Distal left leg incision granulating and slowly healing Increase in bilateral pedal edema, not responding to torsemide-add metolazone 5 mg daily for 3 days in addition to her current diuretic  Plan: Return for wound check in 6 weeks

## 2014-03-20 DIAGNOSIS — N183 Chronic kidney disease, stage 3 (moderate): Secondary | ICD-10-CM | POA: Diagnosis not present

## 2014-03-20 DIAGNOSIS — I5033 Acute on chronic diastolic (congestive) heart failure: Secondary | ICD-10-CM | POA: Diagnosis not present

## 2014-03-20 DIAGNOSIS — R600 Localized edema: Secondary | ICD-10-CM | POA: Diagnosis not present

## 2014-03-20 DIAGNOSIS — I2510 Atherosclerotic heart disease of native coronary artery without angina pectoris: Secondary | ICD-10-CM | POA: Diagnosis not present

## 2014-03-21 DIAGNOSIS — D649 Anemia, unspecified: Secondary | ICD-10-CM | POA: Diagnosis not present

## 2014-03-21 DIAGNOSIS — E871 Hypo-osmolality and hyponatremia: Secondary | ICD-10-CM | POA: Diagnosis not present

## 2014-03-21 DIAGNOSIS — R609 Edema, unspecified: Secondary | ICD-10-CM | POA: Diagnosis not present

## 2014-03-21 DIAGNOSIS — T814XXA Infection following a procedure, initial encounter: Secondary | ICD-10-CM | POA: Diagnosis not present

## 2014-03-25 DIAGNOSIS — D649 Anemia, unspecified: Secondary | ICD-10-CM | POA: Diagnosis not present

## 2014-03-25 DIAGNOSIS — E871 Hypo-osmolality and hyponatremia: Secondary | ICD-10-CM | POA: Diagnosis not present

## 2014-03-25 DIAGNOSIS — W19XXXA Unspecified fall, initial encounter: Secondary | ICD-10-CM | POA: Diagnosis not present

## 2014-03-25 DIAGNOSIS — F05 Delirium due to known physiological condition: Secondary | ICD-10-CM | POA: Diagnosis not present

## 2014-03-25 DIAGNOSIS — R609 Edema, unspecified: Secondary | ICD-10-CM | POA: Diagnosis not present

## 2014-03-26 DIAGNOSIS — L97829 Non-pressure chronic ulcer of other part of left lower leg with unspecified severity: Secondary | ICD-10-CM | POA: Diagnosis not present

## 2014-03-28 DIAGNOSIS — F05 Delirium due to known physiological condition: Secondary | ICD-10-CM | POA: Diagnosis not present

## 2014-03-28 DIAGNOSIS — R609 Edema, unspecified: Secondary | ICD-10-CM | POA: Diagnosis not present

## 2014-03-28 DIAGNOSIS — D649 Anemia, unspecified: Secondary | ICD-10-CM | POA: Diagnosis not present

## 2014-03-28 DIAGNOSIS — E871 Hypo-osmolality and hyponatremia: Secondary | ICD-10-CM | POA: Diagnosis not present

## 2014-03-28 DIAGNOSIS — W19XXXA Unspecified fall, initial encounter: Secondary | ICD-10-CM | POA: Diagnosis not present

## 2014-03-31 ENCOUNTER — Ambulatory Visit (INDEPENDENT_AMBULATORY_CARE_PROVIDER_SITE_OTHER): Payer: Medicare HMO | Admitting: Ophthalmology

## 2014-03-31 DIAGNOSIS — I2510 Atherosclerotic heart disease of native coronary artery without angina pectoris: Secondary | ICD-10-CM | POA: Diagnosis not present

## 2014-03-31 DIAGNOSIS — I209 Angina pectoris, unspecified: Secondary | ICD-10-CM | POA: Diagnosis not present

## 2014-03-31 DIAGNOSIS — E1122 Type 2 diabetes mellitus with diabetic chronic kidney disease: Secondary | ICD-10-CM | POA: Diagnosis not present

## 2014-03-31 DIAGNOSIS — N184 Chronic kidney disease, stage 4 (severe): Secondary | ICD-10-CM | POA: Diagnosis not present

## 2014-04-02 DIAGNOSIS — L853 Xerosis cutis: Secondary | ICD-10-CM | POA: Diagnosis not present

## 2014-04-02 DIAGNOSIS — D649 Anemia, unspecified: Secondary | ICD-10-CM | POA: Diagnosis not present

## 2014-04-02 DIAGNOSIS — F05 Delirium due to known physiological condition: Secondary | ICD-10-CM | POA: Diagnosis not present

## 2014-04-02 DIAGNOSIS — E871 Hypo-osmolality and hyponatremia: Secondary | ICD-10-CM | POA: Diagnosis not present

## 2014-04-02 DIAGNOSIS — R609 Edema, unspecified: Secondary | ICD-10-CM | POA: Diagnosis not present

## 2014-04-02 DIAGNOSIS — W19XXXA Unspecified fall, initial encounter: Secondary | ICD-10-CM | POA: Diagnosis not present

## 2014-04-03 DIAGNOSIS — R748 Abnormal levels of other serum enzymes: Secondary | ICD-10-CM | POA: Diagnosis not present

## 2014-04-08 DIAGNOSIS — Z79899 Other long term (current) drug therapy: Secondary | ICD-10-CM | POA: Diagnosis not present

## 2014-04-08 DIAGNOSIS — E119 Type 2 diabetes mellitus without complications: Secondary | ICD-10-CM | POA: Diagnosis not present

## 2014-04-08 DIAGNOSIS — I5033 Acute on chronic diastolic (congestive) heart failure: Secondary | ICD-10-CM | POA: Diagnosis not present

## 2014-04-08 DIAGNOSIS — D649 Anemia, unspecified: Secondary | ICD-10-CM | POA: Diagnosis not present

## 2014-04-08 DIAGNOSIS — R0602 Shortness of breath: Secondary | ICD-10-CM | POA: Diagnosis not present

## 2014-04-08 DIAGNOSIS — I249 Acute ischemic heart disease, unspecified: Secondary | ICD-10-CM | POA: Diagnosis not present

## 2014-04-09 DIAGNOSIS — N184 Chronic kidney disease, stage 4 (severe): Secondary | ICD-10-CM | POA: Diagnosis not present

## 2014-04-09 DIAGNOSIS — N189 Chronic kidney disease, unspecified: Secondary | ICD-10-CM | POA: Diagnosis not present

## 2014-04-09 DIAGNOSIS — D631 Anemia in chronic kidney disease: Secondary | ICD-10-CM | POA: Diagnosis not present

## 2014-04-09 DIAGNOSIS — D649 Anemia, unspecified: Secondary | ICD-10-CM | POA: Diagnosis not present

## 2014-04-09 DIAGNOSIS — E1129 Type 2 diabetes mellitus with other diabetic kidney complication: Secondary | ICD-10-CM | POA: Diagnosis not present

## 2014-04-09 DIAGNOSIS — N2581 Secondary hyperparathyroidism of renal origin: Secondary | ICD-10-CM | POA: Diagnosis not present

## 2014-04-15 DIAGNOSIS — Z79899 Other long term (current) drug therapy: Secondary | ICD-10-CM | POA: Diagnosis not present

## 2014-04-15 DIAGNOSIS — R0602 Shortness of breath: Secondary | ICD-10-CM | POA: Diagnosis not present

## 2014-04-15 DIAGNOSIS — I5033 Acute on chronic diastolic (congestive) heart failure: Secondary | ICD-10-CM | POA: Diagnosis not present

## 2014-04-15 DIAGNOSIS — I249 Acute ischemic heart disease, unspecified: Secondary | ICD-10-CM | POA: Diagnosis not present

## 2014-04-15 DIAGNOSIS — E119 Type 2 diabetes mellitus without complications: Secondary | ICD-10-CM | POA: Diagnosis not present

## 2014-04-15 DIAGNOSIS — N179 Acute kidney failure, unspecified: Secondary | ICD-10-CM | POA: Diagnosis not present

## 2014-04-16 DIAGNOSIS — D649 Anemia, unspecified: Secondary | ICD-10-CM | POA: Diagnosis not present

## 2014-04-16 DIAGNOSIS — N184 Chronic kidney disease, stage 4 (severe): Secondary | ICD-10-CM | POA: Diagnosis not present

## 2014-04-16 DIAGNOSIS — R609 Edema, unspecified: Secondary | ICD-10-CM | POA: Diagnosis not present

## 2014-04-16 DIAGNOSIS — E119 Type 2 diabetes mellitus without complications: Secondary | ICD-10-CM | POA: Diagnosis not present

## 2014-04-16 DIAGNOSIS — I10 Essential (primary) hypertension: Secondary | ICD-10-CM | POA: Diagnosis not present

## 2014-04-16 DIAGNOSIS — I5033 Acute on chronic diastolic (congestive) heart failure: Secondary | ICD-10-CM | POA: Diagnosis not present

## 2014-04-16 DIAGNOSIS — R0602 Shortness of breath: Secondary | ICD-10-CM | POA: Diagnosis not present

## 2014-04-16 DIAGNOSIS — E871 Hypo-osmolality and hyponatremia: Secondary | ICD-10-CM | POA: Diagnosis not present

## 2014-04-16 DIAGNOSIS — Z79899 Other long term (current) drug therapy: Secondary | ICD-10-CM | POA: Diagnosis not present

## 2014-04-22 DIAGNOSIS — D649 Anemia, unspecified: Secondary | ICD-10-CM | POA: Diagnosis not present

## 2014-04-22 DIAGNOSIS — I10 Essential (primary) hypertension: Secondary | ICD-10-CM | POA: Diagnosis not present

## 2014-04-22 DIAGNOSIS — R609 Edema, unspecified: Secondary | ICD-10-CM | POA: Diagnosis not present

## 2014-04-22 DIAGNOSIS — J302 Other seasonal allergic rhinitis: Secondary | ICD-10-CM | POA: Diagnosis not present

## 2014-04-23 DIAGNOSIS — D649 Anemia, unspecified: Secondary | ICD-10-CM | POA: Diagnosis not present

## 2014-04-23 DIAGNOSIS — E119 Type 2 diabetes mellitus without complications: Secondary | ICD-10-CM | POA: Diagnosis not present

## 2014-04-23 DIAGNOSIS — N184 Chronic kidney disease, stage 4 (severe): Secondary | ICD-10-CM | POA: Diagnosis not present

## 2014-04-23 DIAGNOSIS — F05 Delirium due to known physiological condition: Secondary | ICD-10-CM | POA: Diagnosis not present

## 2014-04-23 DIAGNOSIS — L97829 Non-pressure chronic ulcer of other part of left lower leg with unspecified severity: Secondary | ICD-10-CM | POA: Diagnosis not present

## 2014-04-23 DIAGNOSIS — E871 Hypo-osmolality and hyponatremia: Secondary | ICD-10-CM | POA: Diagnosis not present

## 2014-04-23 DIAGNOSIS — I5033 Acute on chronic diastolic (congestive) heart failure: Secondary | ICD-10-CM | POA: Diagnosis not present

## 2014-04-23 DIAGNOSIS — Z79899 Other long term (current) drug therapy: Secondary | ICD-10-CM | POA: Diagnosis not present

## 2014-04-23 DIAGNOSIS — R609 Edema, unspecified: Secondary | ICD-10-CM | POA: Diagnosis not present

## 2014-04-23 DIAGNOSIS — R0602 Shortness of breath: Secondary | ICD-10-CM | POA: Diagnosis not present

## 2014-04-24 DIAGNOSIS — T8189XA Other complications of procedures, not elsewhere classified, initial encounter: Secondary | ICD-10-CM | POA: Diagnosis not present

## 2014-04-25 ENCOUNTER — Encounter (HOSPITAL_COMMUNITY): Payer: Self-pay

## 2014-04-25 ENCOUNTER — Encounter (HOSPITAL_COMMUNITY)
Admission: RE | Admit: 2014-04-25 | Discharge: 2014-04-25 | Disposition: A | Discharge status: Home / Self Care | Payer: Commercial Managed Care - HMO | Source: Ambulatory Visit | Attending: Nephrology | Admitting: Nephrology

## 2014-04-25 DIAGNOSIS — T8189XA Other complications of procedures, not elsewhere classified, initial encounter: Secondary | ICD-10-CM | POA: Diagnosis not present

## 2014-04-25 DIAGNOSIS — D509 Iron deficiency anemia, unspecified: Secondary | ICD-10-CM | POA: Insufficient documentation

## 2014-04-25 MED ORDER — SODIUM CHLORIDE 0.9 % IV SOLN
INTRAVENOUS | Status: DC
  Administered 2014-04-25: 250 mL via INTRAVENOUS

## 2014-04-25 MED ORDER — SODIUM CHLORIDE 0.9 % IV SOLN
510.0000 mg | Freq: Once | INTRAVENOUS | Status: AC
  Administered 2014-04-25: 510 mg via INTRAVENOUS
  Filled 2014-04-25: qty 17

## 2014-04-25 NOTE — Discharge Instructions (Signed)

## 2014-04-29 ENCOUNTER — Other Ambulatory Visit: Payer: Self-pay | Admitting: Cardiothoracic Surgery

## 2014-04-29 DIAGNOSIS — Z951 Presence of aortocoronary bypass graft: Secondary | ICD-10-CM

## 2014-04-30 ENCOUNTER — Ambulatory Visit
Admission: RE | Admit: 2014-04-30 | Discharge: 2014-04-30 | Disposition: A | Discharge status: Home / Self Care | Payer: Commercial Managed Care - HMO | Source: Ambulatory Visit | Attending: Cardiothoracic Surgery | Admitting: Cardiothoracic Surgery

## 2014-04-30 ENCOUNTER — Ambulatory Visit (INDEPENDENT_AMBULATORY_CARE_PROVIDER_SITE_OTHER): Payer: Commercial Managed Care - HMO | Admitting: Cardiothoracic Surgery

## 2014-04-30 ENCOUNTER — Encounter: Payer: Self-pay | Admitting: Cardiothoracic Surgery

## 2014-04-30 VITALS — BP 135/65 | HR 71 | Resp 16 | Ht 64.0 in | Wt 186.0 lb

## 2014-04-30 DIAGNOSIS — R609 Edema, unspecified: Secondary | ICD-10-CM | POA: Diagnosis not present

## 2014-04-30 DIAGNOSIS — I2510 Atherosclerotic heart disease of native coronary artery without angina pectoris: Secondary | ICD-10-CM | POA: Diagnosis not present

## 2014-04-30 DIAGNOSIS — L97829 Non-pressure chronic ulcer of other part of left lower leg with unspecified severity: Secondary | ICD-10-CM | POA: Diagnosis not present

## 2014-04-30 DIAGNOSIS — R0602 Shortness of breath: Secondary | ICD-10-CM | POA: Diagnosis not present

## 2014-04-30 DIAGNOSIS — Z951 Presence of aortocoronary bypass graft: Secondary | ICD-10-CM | POA: Diagnosis not present

## 2014-04-30 DIAGNOSIS — Z954 Presence of other heart-valve replacement: Secondary | ICD-10-CM

## 2014-04-30 DIAGNOSIS — E119 Type 2 diabetes mellitus without complications: Secondary | ICD-10-CM | POA: Diagnosis not present

## 2014-04-30 DIAGNOSIS — Z952 Presence of prosthetic heart valve: Secondary | ICD-10-CM

## 2014-04-30 DIAGNOSIS — E871 Hypo-osmolality and hyponatremia: Secondary | ICD-10-CM | POA: Diagnosis not present

## 2014-04-30 DIAGNOSIS — I5033 Acute on chronic diastolic (congestive) heart failure: Secondary | ICD-10-CM | POA: Diagnosis not present

## 2014-04-30 DIAGNOSIS — N184 Chronic kidney disease, stage 4 (severe): Secondary | ICD-10-CM | POA: Diagnosis not present

## 2014-04-30 DIAGNOSIS — D649 Anemia, unspecified: Secondary | ICD-10-CM | POA: Diagnosis not present

## 2014-04-30 DIAGNOSIS — I249 Acute ischemic heart disease, unspecified: Secondary | ICD-10-CM | POA: Diagnosis not present

## 2014-04-30 DIAGNOSIS — I340 Nonrheumatic mitral (valve) insufficiency: Secondary | ICD-10-CM | POA: Diagnosis not present

## 2014-04-30 DIAGNOSIS — Z79899 Other long term (current) drug therapy: Secondary | ICD-10-CM | POA: Diagnosis not present

## 2014-04-30 NOTE — Progress Notes (Signed)
PCP is Odette Fraction, MD Referring Provider is Laverda Page, MD  Chief Complaint  Patient presents with  . Routine Post Op    6 wk f/u with cxr    HPI:the patient returns for her final surgical followup after undergoing combined CABG and mitral valve replacement 3 months agofor severe CAD, ischemic mitral regurgitation and heart failure. She had a prolonged postoperative recovery because of her chronic renal failure and postoperative atrial fibrillation. She was discharged to a skilled nursing facility in [place] where she is still living. She has progressed since her last visit a month ago. She is now ambulating with a walker. Her exercise tolerance is better and she is off oxygen. She is maintained sinus rhythm. Her surgical incisions are healed. She is anxious to return home to live with her son and she appears to be physically strong enough and healthy enough to return home at this point.   Past Medical History  Diagnosis Date  . GERD (gastroesophageal reflux disease)   . PUD (peptic ulcer disease)   . Anemia   . Hypertension   . S/P endoscopy Aug 2011    3 superficial gastric ulcers, NSAID-induced  . S/P colonoscopy Sept 2011    left-sided diverticula, tubular adenoma  . Coronary artery disease   . Shortness of breath   . Diabetes mellitus     insulin dependent  . Headache(784.0)     rare migraines  . Cancer     hx of skin cancer  . Arthritis   . Osteopenia   . Hypercholesterolemia   . SIADH (syndrome of inappropriate ADH production)   . CHF (congestive heart failure)   . Myocardial infarction 2013  . Chronic renal failure, stage 3 (moderate)     Past Surgical History  Procedure Laterality Date  . Tonsillectomy    . Appendectomy    . Eye surgery      cataracts  . Cardiac stents  07/19/2011  . Esophagogastroduodenoscopy  10/16/09    normal without barrett's/three superficial gastric ulcers  . Colonoscopy  12/04/09    normal rectum/left-sided diverticula   . Joint replacement  arthroscopy to knee  . Toe amputation Left 2013    left second toe amputation  . Cardiac catheterization  01/22/2013  . Coronary angioplasty  07/2011  . Coronary artery bypass graft N/A 11/08/2013    Procedure: CORONARY ARTERY BYPASS GRAFTING (CABG), on pump, times two, using left internal mammary artery, cryo saphenous vein.;  Surgeon: Ivin Poot, MD;  Location: [place];  Service: Open Heart Surgery;  Laterality: N/A;  LIMA-LAD CRYOVEIN -OM  . Intraoperative transesophageal echocardiogram N/A 11/08/2013    Procedure: INTRAOPERATIVE TRANSESOPHAGEAL ECHOCARDIOGRAM;  Surgeon: Ivin Poot, MD;  Location: [place];  Service: Open Heart Surgery;  Laterality: N/A;  . Mitral valve replacement N/A 11/08/2013    Procedure: MITRAL VALVE (MV) REPLACEMENT;  Surgeon: Ivin Poot, MD;  Location: [place];  Service: Open Heart Surgery;  Laterality: N/A;  #25 MAGNA MITRAL EASE  . Left heart catheterization with coronary angiogram N/A 07/19/2011    Procedure: LEFT HEART CATHETERIZATION WITH CORONARY ANGIOGRAM;  Surgeon: Laverda Page, MD;  Location: [facility] CATH LAB;  Service: Cardiovascular;  Laterality: N/A;  . Left heart catheterization with coronary angiogram N/A 12/21/2011    Procedure: LEFT HEART CATHETERIZATION WITH CORONARY ANGIOGRAM;  Surgeon: Laverda Page, MD;  Location: [facility] CATH LAB;  Service: Cardiovascular;  Laterality: N/A;  . Left heart catheterization with coronary angiogram N/A 02/20/2012  Procedure: LEFT HEART CATHETERIZATION WITH CORONARY ANGIOGRAM;  Surgeon: Laverda Page, MD;  Location: [facility] CATH LAB;  Service: Cardiovascular;  Laterality: N/A;  . Left heart catheterization with coronary angiogram N/A 09/03/2012    Procedure: LEFT HEART CATHETERIZATION WITH CORONARY ANGIOGRAM;  Surgeon: Laverda Page, MD;  Location: [facility] CATH LAB;  Service: Cardiovascular;  Laterality: N/A;  . Percutaneous coronary stent intervention (pci-s)  09/03/2012    Procedure: PERCUTANEOUS  CORONARY STENT INTERVENTION (PCI-S);  Surgeon: Laverda Page, MD;  Location: [facility] CATH LAB;  Service: Cardiovascular;;  . Left heart catheterization with coronary angiogram N/A 12/04/2012    Procedure: LEFT HEART CATHETERIZATION WITH CORONARY ANGIOGRAM;  Surgeon: Laverda Page, MD;  Location: [facility] CATH LAB;  Service: Cardiovascular;  Laterality: N/A;  . Left heart catheterization with coronary angiogram N/A 01/22/2013    Procedure: LEFT HEART CATHETERIZATION WITH CORONARY ANGIOGRAM;  Surgeon: Laverda Page, MD;  Location: [facility] CATH LAB;  Service: Cardiovascular;  Laterality: N/A;  . Left heart catheterization with coronary angiogram N/A 06/26/2013    Procedure: LEFT HEART CATHETERIZATION WITH CORONARY ANGIOGRAM;  Surgeon: Laverda Page, MD;  Location: [facility] CATH LAB;  Service: Cardiovascular;  Laterality: N/A;  . Left heart catheterization with coronary angiogram N/A 11/05/2013    Procedure: LEFT HEART CATHETERIZATION WITH CORONARY ANGIOGRAM;  Surgeon: Laverda Page, MD;  Location: [facility] CATH LAB;  Service: Cardiovascular;  Laterality: N/A;    No family history on file.  Social History History  Substance Use Topics  . Smoking status: Never Smoker   . Smokeless tobacco: Never Used  . Alcohol Use: No    Current Outpatient Prescriptions  Medication Sig Dispense Refill  . acetaminophen (TYLENOL) 500 MG tablet Take 1,000 mg by mouth every 6 (six) hours as needed.    . Amino Acids-Protein Hydrolys (FEEDING SUPPLEMENT, PRO-STAT SUGAR FREE 64,) LIQD Take 30 mLs by mouth 2 (two) times daily.    Marland Kitchen ascorbic acid (VITAMIN C) 500 MG tablet Take 500 mg by mouth 2 (two) times daily.    Marland Kitchen aspirin 81 MG chewable tablet Chew 2 tablets (162 mg total) by mouth daily.    . cetirizine (ZYRTEC) 10 MG tablet Take 10 mg by mouth at bedtime.    . feeding supplement, GLUCERNA SHAKE, (GLUCERNA SHAKE) LIQD Take 237 mLs by mouth 3 (three) times daily with meals.  0  . ferrous sulfate 325 (65 FE) MG tablet Take  325 mg by mouth 2 (two) times daily with a meal.    . fluticasone (FLONASE) 50 MCG/ACT nasal spray Place 1 spray into both nostrils daily. 16 g 2  . insulin aspart (NOVOLOG FLEXPEN) 100 UNIT/ML FlexPen Inject 5-10 Units into the skin 3 (three) times daily with meals. 70-120=0    251-300=5 121-150=1  301-350=7 151-200=2  351-400=9 201-250=3  400 or >, call MD  units of insulin (Patient taking differently: Inject 12 Units into the skin 2 (two) times daily with a meal. ) 15 mL 11  . insulin glargine (LANTUS) 100 UNIT/ML injection Inject 0.2 mLs (20 Units total) into the skin 2 (two) times daily. 10 mL 11  . ipratropium-albuterol (DUONEB) 0.5-2.5 (3) MG/3ML SOLN Take 3 mLs by nebulization every 6 (six) hours as needed.    . Melatonin 3 MG TABS Take 3 mg by mouth at bedtime as needed (insomnia).    . metoprolol tartrate (LOPRESSOR) 25 MG tablet Take 1 tablet (25 mg total) by mouth 2 (two) times daily.    . nitroGLYCERIN (NITROSTAT)  0.4 MG SL tablet Place 0.4 mg under the tongue every 5 (five) minutes as needed for chest pain.    Marland Kitchen ondansetron (ZOFRAN) 4 MG tablet Take 4 mg by mouth every 6 (six) hours as needed for nausea or vomiting.    . pantoprazole (PROTONIX) 40 MG tablet Take 40 mg by mouth daily.    . polyethylene glycol (MIRALAX / GLYCOLAX) packet Take 17 g by mouth every other day.    . sennosides-docusate sodium (SENOKOT-S) 8.6-50 MG tablet Take 1 tablet by mouth 2 (two) times daily.    . temazepam (RESTORIL) 7.5 MG capsule Take 1 capsule (7.5 mg total) by mouth at bedtime as needed for sleep. 30 capsule 0  . torsemide (DEMADEX) 20 MG tablet Take 20 mg by mouth daily.    . sodium chloride 1 G tablet Take 1 g by mouth 2 (two) times daily.     No current facility-administered medications for this visit.    Allergies  Allergen Reactions  . Aspirin Other (See Comments)    High doses caused stomach bleeds    Review of Systems   Gen.-no fever, weight is stable, no falls HEENT-no vision  complaints no headaches Pulmonary-no productive cough or wheezing Cardiac-no palpitations or angina Abdomen-no diarrhea or vomiting Extremities-chronic bilateral pedal edema Neurologic-no TIA dizziness or syncope Skin-no active rash BP 135/65 mmHg  Pulse 71  Resp 16  Ht 5\' 4"  (1.626 m)  Wt 186 lb (84.369 kg)  BMI 31.91 kg/m2  SpO2 97% Physical Exam Gen.-alert and comfortable appears to be very sharp today in stronger sitting in a wheelchair HEENT-normocephalic pupils equal Neck-supple without JVD Chest-breath sounds clear bilaterally Cardiac-regular rhythm, no murmur of mitral regurgitation Abdomen-soft nontender Extremities-baseline 1-2+ pedal edema Neurologic-no focal motor deficit  Diagnostic Tests: Chest x-ray taken today personally reviewed Improved aeration, no significant pleural effusions or evidence of CHF  Impression: Prolonged but now adequate recovery following combined CABG-MVR and patient appears to be in adequate health to return to live with her son at home.  Plan additional short course of by mouth metolazone will be added to her Demadex to help reduce the pedal edema. The patient will be followed by her cardiologist Dr. Adrian Prows and return here as needed.

## 2014-05-02 ENCOUNTER — Encounter (HOSPITAL_COMMUNITY)
Admission: RE | Admit: 2014-05-02 | Discharge: 2014-05-02 | Disposition: A | Discharge status: Home / Self Care | Payer: Commercial Managed Care - HMO | Source: Ambulatory Visit | Attending: Nephrology | Admitting: Nephrology

## 2014-05-02 DIAGNOSIS — D509 Iron deficiency anemia, unspecified: Secondary | ICD-10-CM | POA: Diagnosis not present

## 2014-05-02 MED ORDER — SODIUM CHLORIDE 0.9 % IV SOLN
Freq: Once | INTRAVENOUS | Status: AC
  Administered 2014-05-02: 200 mL via INTRAVENOUS

## 2014-05-02 MED ORDER — SODIUM CHLORIDE 0.9 % IV SOLN
510.0000 mg | Freq: Once | INTRAVENOUS | Status: AC
  Administered 2014-05-02: 510 mg via INTRAVENOUS
  Filled 2014-05-02: qty 17

## 2014-05-07 DIAGNOSIS — I5020 Unspecified systolic (congestive) heart failure: Secondary | ICD-10-CM | POA: Diagnosis not present

## 2014-05-07 DIAGNOSIS — Z79899 Other long term (current) drug therapy: Secondary | ICD-10-CM | POA: Diagnosis not present

## 2014-05-07 DIAGNOSIS — I4891 Unspecified atrial fibrillation: Secondary | ICD-10-CM | POA: Diagnosis not present

## 2014-05-07 DIAGNOSIS — R0602 Shortness of breath: Secondary | ICD-10-CM | POA: Diagnosis not present

## 2014-05-07 DIAGNOSIS — I10 Essential (primary) hypertension: Secondary | ICD-10-CM | POA: Diagnosis not present

## 2014-05-07 DIAGNOSIS — R609 Edema, unspecified: Secondary | ICD-10-CM | POA: Diagnosis not present

## 2014-05-07 DIAGNOSIS — D649 Anemia, unspecified: Secondary | ICD-10-CM | POA: Diagnosis not present

## 2014-05-07 DIAGNOSIS — N184 Chronic kidney disease, stage 4 (severe): Secondary | ICD-10-CM | POA: Diagnosis not present

## 2014-05-08 DIAGNOSIS — E139 Other specified diabetes mellitus without complications: Secondary | ICD-10-CM | POA: Diagnosis not present

## 2014-05-08 DIAGNOSIS — K5900 Constipation, unspecified: Secondary | ICD-10-CM | POA: Diagnosis not present

## 2014-05-08 DIAGNOSIS — G4700 Insomnia, unspecified: Secondary | ICD-10-CM | POA: Diagnosis not present

## 2014-05-08 DIAGNOSIS — J302 Other seasonal allergic rhinitis: Secondary | ICD-10-CM | POA: Diagnosis not present

## 2014-05-10 DIAGNOSIS — I10 Essential (primary) hypertension: Secondary | ICD-10-CM | POA: Diagnosis not present

## 2014-05-10 DIAGNOSIS — D649 Anemia, unspecified: Secondary | ICD-10-CM | POA: Diagnosis not present

## 2014-05-10 DIAGNOSIS — M6281 Muscle weakness (generalized): Secondary | ICD-10-CM | POA: Diagnosis not present

## 2014-05-10 DIAGNOSIS — F339 Major depressive disorder, recurrent, unspecified: Secondary | ICD-10-CM | POA: Diagnosis not present

## 2014-05-10 DIAGNOSIS — I4891 Unspecified atrial fibrillation: Secondary | ICD-10-CM | POA: Diagnosis not present

## 2014-05-10 DIAGNOSIS — I129 Hypertensive chronic kidney disease with stage 1 through stage 4 chronic kidney disease, or unspecified chronic kidney disease: Secondary | ICD-10-CM | POA: Diagnosis not present

## 2014-05-10 DIAGNOSIS — I509 Heart failure, unspecified: Secondary | ICD-10-CM | POA: Diagnosis not present

## 2014-05-10 DIAGNOSIS — E119 Type 2 diabetes mellitus without complications: Secondary | ICD-10-CM | POA: Diagnosis not present

## 2014-05-10 DIAGNOSIS — N184 Chronic kidney disease, stage 4 (severe): Secondary | ICD-10-CM | POA: Diagnosis not present

## 2014-05-13 ENCOUNTER — Encounter: Payer: Self-pay | Admitting: Family Medicine

## 2014-05-13 ENCOUNTER — Telehealth: Payer: Self-pay | Admitting: Family Medicine

## 2014-05-13 ENCOUNTER — Ambulatory Visit (INDEPENDENT_AMBULATORY_CARE_PROVIDER_SITE_OTHER): Payer: Medicare HMO | Admitting: Family Medicine

## 2014-05-13 ENCOUNTER — Other Ambulatory Visit: Payer: Self-pay | Admitting: Family Medicine

## 2014-05-13 VITALS — BP 128/72 | HR 64 | Temp 97.9°F | Resp 16 | Ht 67.0 in | Wt 179.0 lb

## 2014-05-13 DIAGNOSIS — I129 Hypertensive chronic kidney disease with stage 1 through stage 4 chronic kidney disease, or unspecified chronic kidney disease: Secondary | ICD-10-CM | POA: Diagnosis not present

## 2014-05-13 DIAGNOSIS — Z953 Presence of xenogenic heart valve: Secondary | ICD-10-CM

## 2014-05-13 DIAGNOSIS — I482 Chronic atrial fibrillation, unspecified: Secondary | ICD-10-CM

## 2014-05-13 DIAGNOSIS — Z951 Presence of aortocoronary bypass graft: Secondary | ICD-10-CM

## 2014-05-13 DIAGNOSIS — R748 Abnormal levels of other serum enzymes: Secondary | ICD-10-CM | POA: Diagnosis not present

## 2014-05-13 DIAGNOSIS — I10 Essential (primary) hypertension: Secondary | ICD-10-CM | POA: Diagnosis not present

## 2014-05-13 DIAGNOSIS — E119 Type 2 diabetes mellitus without complications: Secondary | ICD-10-CM | POA: Diagnosis not present

## 2014-05-13 DIAGNOSIS — M6281 Muscle weakness (generalized): Secondary | ICD-10-CM | POA: Diagnosis not present

## 2014-05-13 DIAGNOSIS — Z794 Long term (current) use of insulin: Secondary | ICD-10-CM

## 2014-05-13 DIAGNOSIS — I509 Heart failure, unspecified: Secondary | ICD-10-CM | POA: Diagnosis not present

## 2014-05-13 DIAGNOSIS — I4891 Unspecified atrial fibrillation: Secondary | ICD-10-CM | POA: Diagnosis not present

## 2014-05-13 DIAGNOSIS — D649 Anemia, unspecified: Secondary | ICD-10-CM | POA: Diagnosis not present

## 2014-05-13 DIAGNOSIS — N184 Chronic kidney disease, stage 4 (severe): Secondary | ICD-10-CM | POA: Diagnosis not present

## 2014-05-13 DIAGNOSIS — Z8673 Personal history of transient ischemic attack (TIA), and cerebral infarction without residual deficits: Secondary | ICD-10-CM

## 2014-05-13 DIAGNOSIS — F339 Major depressive disorder, recurrent, unspecified: Secondary | ICD-10-CM | POA: Diagnosis not present

## 2014-05-13 DIAGNOSIS — IMO0001 Reserved for inherently not codable concepts without codable children: Secondary | ICD-10-CM

## 2014-05-13 LAB — CBC WITH DIFFERENTIAL/PLATELET
Basophils Absolute: 0.1 10*3/uL (ref 0.0–0.1)
Basophils Relative: 1 % (ref 0–1)
EOS ABS: 0.2 10*3/uL (ref 0.0–0.7)
Eosinophils Relative: 3 % (ref 0–5)
HCT: 32 % — ABNORMAL LOW (ref 36.0–46.0)
HEMOGLOBIN: 9.7 g/dL — AB (ref 12.0–15.0)
Lymphocytes Relative: 13 % (ref 12–46)
Lymphs Abs: 0.7 10*3/uL (ref 0.7–4.0)
MCH: 26.7 pg (ref 26.0–34.0)
MCHC: 30.3 g/dL (ref 30.0–36.0)
MCV: 88.2 fL (ref 78.0–100.0)
MONOS PCT: 11 % (ref 3–12)
MPV: 9.4 fL (ref 8.6–12.4)
Monocytes Absolute: 0.6 10*3/uL (ref 0.1–1.0)
NEUTROS PCT: 72 % (ref 43–77)
Neutro Abs: 3.9 10*3/uL (ref 1.7–7.7)
Platelets: 286 10*3/uL (ref 150–400)
RBC: 3.63 MIL/uL — ABNORMAL LOW (ref 3.87–5.11)
RDW: 19.4 % — ABNORMAL HIGH (ref 11.5–15.5)
WBC: 5.4 10*3/uL (ref 4.0–10.5)

## 2014-05-13 LAB — COMPLETE METABOLIC PANEL WITH GFR
ALK PHOS: 374 U/L — AB (ref 39–117)
ALT: 15 U/L (ref 0–35)
AST: 18 U/L (ref 0–37)
Albumin: 3.3 g/dL — ABNORMAL LOW (ref 3.5–5.2)
BILIRUBIN TOTAL: 0.7 mg/dL (ref 0.2–1.2)
BUN: 69 mg/dL — AB (ref 6–23)
CO2: 26 mEq/L (ref 19–32)
Calcium: 8.8 mg/dL (ref 8.4–10.5)
Chloride: 94 mEq/L — ABNORMAL LOW (ref 96–112)
Creat: 2 mg/dL — ABNORMAL HIGH (ref 0.50–1.10)
GFR, Est African American: 27 mL/min — ABNORMAL LOW
GFR, Est Non African American: 23 mL/min — ABNORMAL LOW
Glucose, Bld: 335 mg/dL — ABNORMAL HIGH (ref 70–99)
Potassium: 5.3 mEq/L (ref 3.5–5.3)
Sodium: 133 mEq/L — ABNORMAL LOW (ref 135–145)
TOTAL PROTEIN: 7 g/dL (ref 6.0–8.3)

## 2014-05-13 LAB — HEMOGLOBIN A1C
Hgb A1c MFr Bld: 7.3 % — ABNORMAL HIGH (ref <5.7)
MEAN PLASMA GLUCOSE: 163 mg/dL — AB (ref <117)

## 2014-05-13 MED ORDER — UNABLE TO FIND: 1.0000 | Freq: Once | Status: DC

## 2014-05-13 NOTE — Telephone Encounter (Signed)
Done and faxed

## 2014-05-13 NOTE — Telephone Encounter (Signed)
FYI  PT will be coming 2 x week for 5 weeks then will be reassessing.  Needs WRITTEN RX (PRINTED) [facility] Hospital Bed, Bedside Commode and Rolling Walker w/seat.  Please fax to [place].

## 2014-05-13 NOTE — Progress Notes (Signed)
Subjective:    Patient ID: Claudia [name], female    DOB: 12/12/1931, 81 y.o.   MRN: [id number]  HPI Patient is a very sweet 81 year old white female who I have not seen in quite some time. She has a complicated recent medical history. Last fall she was admitted for coronary artery bypass grafting and also mitral valve replacement with a bioprosthetic valve. She had a complicated postoperative course March with congestive heart failure and a prolonged ICU stay and delirium. She also had atrial fibrillation in the postoperative course. She was initially placed on Coumadin but this was discontinued due to her persistent falling and ataxia as she was thought to be too high risk candidate for Coumadin. She was discharged to a skilled nursing facility but was then readmitted in October with congestive heart failure. Patient was adequately diuresed and was discharged to a skilled nursing facility. Unfortunately in December she was readmitted with what appeared to be a transient ischemic attack. Patient had word finding aphasia and confusion. I reviewed the evaluation at the hospital which include echocardiogram, carotid Dopplers, MRI, and Holter monitor which revealed no embolic source of stroke. There is a discussion about resuming Coumadin for stroke prevention given the history of atrial fibrillation but due to the patient's imbalance and falling he was felt the Coumadin was too high risk and she was discharged on aspirin 81 mg, 2 tablets daily. She recently was discharged from the skilled nursing facility having regained sufficient strength. She denies any chest pain shortness of breath or dyspnea on exertion. She is ambulating with a walker. She did fall recently and strike her left frontal scalp on a wall.  This occurred at night when the patient was trying to get out of bed to go to the restroom and her feet became tangled in her bed sheets. Overall she states that her balance is better and she feels much  per stable on her feet however this gives me certain pulse about resuming Coumadin. She is currently on Lantus 12 units subcutaneous twice a day for insulin-dependent diabetes mellitus. Her fasting blood sugar since discharge home have been 100 120 in the morning however her sugars prior to lunch and dinner ranging in the high 200s to 300s. She is currently using 5 units to 7 units of NovoLog with lunch and dinner and 3 units of NovoLog with breakfast. She also has a history of chronic kidney disease and anemia secondary to chronic kidney disease. She last saw her nephrologist in January. She is overdue for repeat lab work to assess for hemoglobin A1c, BMP, and a CBC. She is also due for fasting lipid panel but unfortunate she is not fasting this morning. Past Medical History  Diagnosis Date  . GERD (gastroesophageal reflux disease)   . PUD (peptic ulcer disease)   . Anemia   . Hypertension   . S/P endoscopy Aug 2011    3 superficial gastric ulcers, NSAID-induced  . S/P colonoscopy Sept 2011    left-sided diverticula, tubular adenoma  . Coronary artery disease   . Shortness of breath   . Diabetes mellitus     insulin dependent  . Headache(784.0)     rare migraines  . Cancer     hx of skin cancer  . Arthritis   . Osteopenia   . Hypercholesterolemia   . SIADH (syndrome of inappropriate ADH production)   . CHF (congestive heart failure)   . Myocardial infarction 2013  . Chronic renal failure, stage 3 (  moderate)    Past Surgical History  Procedure Laterality Date  . Tonsillectomy    . Appendectomy    . Eye surgery      cataracts  . Cardiac stents  07/19/2011  . Esophagogastroduodenoscopy  10/16/09    normal without barrett's/three superficial gastric ulcers  . Colonoscopy  12/04/09    normal rectum/left-sided diverticula  . Joint replacement  arthroscopy to knee  . Toe amputation Left 2013    left second toe amputation  . Cardiac catheterization  01/22/2013  . Coronary angioplasty   07/2011  . Coronary artery bypass graft N/A 11/08/2013    Procedure: CORONARY ARTERY BYPASS GRAFTING (CABG), on pump, times two, using left internal mammary artery, cryo saphenous vein.;  Surgeon: Ivin Poot, MD;  Location: [place];  Service: Open Heart Surgery;  Laterality: N/A;  LIMA-LAD CRYOVEIN -OM  . Intraoperative transesophageal echocardiogram N/A 11/08/2013    Procedure: INTRAOPERATIVE TRANSESOPHAGEAL ECHOCARDIOGRAM;  Surgeon: Ivin Poot, MD;  Location: [place];  Service: Open Heart Surgery;  Laterality: N/A;  . Mitral valve replacement N/A 11/08/2013    Procedure: MITRAL VALVE (MV) REPLACEMENT;  Surgeon: Ivin Poot, MD;  Location: [place];  Service: Open Heart Surgery;  Laterality: N/A;  #25 MAGNA MITRAL EASE  . Left heart catheterization with coronary angiogram N/A 07/19/2011    Procedure: LEFT HEART CATHETERIZATION WITH CORONARY ANGIOGRAM;  Surgeon: Laverda Page, MD;  Location: [facility] CATH LAB;  Service: Cardiovascular;  Laterality: N/A;  . Left heart catheterization with coronary angiogram N/A 12/21/2011    Procedure: LEFT HEART CATHETERIZATION WITH CORONARY ANGIOGRAM;  Surgeon: Laverda Page, MD;  Location: [facility] CATH LAB;  Service: Cardiovascular;  Laterality: N/A;  . Left heart catheterization with coronary angiogram N/A 02/20/2012    Procedure: LEFT HEART CATHETERIZATION WITH CORONARY ANGIOGRAM;  Surgeon: Laverda Page, MD;  Location: [facility] CATH LAB;  Service: Cardiovascular;  Laterality: N/A;  . Left heart catheterization with coronary angiogram N/A 09/03/2012    Procedure: LEFT HEART CATHETERIZATION WITH CORONARY ANGIOGRAM;  Surgeon: Laverda Page, MD;  Location: [facility] CATH LAB;  Service: Cardiovascular;  Laterality: N/A;  . Percutaneous coronary stent intervention (pci-s)  09/03/2012    Procedure: PERCUTANEOUS CORONARY STENT INTERVENTION (PCI-S);  Surgeon: Laverda Page, MD;  Location: [facility] CATH LAB;  Service: Cardiovascular;;  . Left heart catheterization with coronary  angiogram N/A 12/04/2012    Procedure: LEFT HEART CATHETERIZATION WITH CORONARY ANGIOGRAM;  Surgeon: Laverda Page, MD;  Location: [facility] CATH LAB;  Service: Cardiovascular;  Laterality: N/A;  . Left heart catheterization with coronary angiogram N/A 01/22/2013    Procedure: LEFT HEART CATHETERIZATION WITH CORONARY ANGIOGRAM;  Surgeon: Laverda Page, MD;  Location: [facility] CATH LAB;  Service: Cardiovascular;  Laterality: N/A;  . Left heart catheterization with coronary angiogram N/A 06/26/2013    Procedure: LEFT HEART CATHETERIZATION WITH CORONARY ANGIOGRAM;  Surgeon: Laverda Page, MD;  Location: [facility] CATH LAB;  Service: Cardiovascular;  Laterality: N/A;  . Left heart catheterization with coronary angiogram N/A 11/05/2013    Procedure: LEFT HEART CATHETERIZATION WITH CORONARY ANGIOGRAM;  Surgeon: Laverda Page, MD;  Location: [facility] CATH LAB;  Service: Cardiovascular;  Laterality: N/A;   Current Outpatient Prescriptions on File Prior to Visit  Medication Sig Dispense Refill  . acetaminophen (TYLENOL) 500 MG tablet Take 1,000 mg by mouth every 6 (six) hours as needed.    Marland Kitchen ascorbic acid (VITAMIN C) 500 MG tablet Take 500 mg by mouth 2 (two) times daily.    Marland Kitchen  aspirin 81 MG chewable tablet Chew 2 tablets (162 mg total) by mouth daily.    . cetirizine (ZYRTEC) 10 MG tablet Take 10 mg by mouth at bedtime.    . feeding supplement, GLUCERNA SHAKE, (GLUCERNA SHAKE) LIQD Take 237 mLs by mouth 3 (three) times daily with meals.  0  . ferrous sulfate 325 (65 FE) MG tablet Take 325 mg by mouth 2 (two) times daily with a meal.    . fluticasone (FLONASE) 50 MCG/ACT nasal spray Place 1 spray into both nostrils daily. 16 g 2  . insulin aspart (NOVOLOG FLEXPEN) 100 UNIT/ML FlexPen Inject 5-10 Units into the skin 3 (three) times daily with meals. 70-120=0    251-300=5 121-150=1  301-350=7 151-200=2  351-400=9 201-250=3  400 or >, call MD  units of insulin (Patient taking differently: Inject 12 Units into the skin 2  (two) times daily with a meal. ) 15 mL 11  . insulin glargine (LANTUS) 100 UNIT/ML injection Inject 0.2 mLs (20 Units total) into the skin 2 (two) times daily. (Patient taking differently: Inject 12 Units into the skin 2 (two) times daily. ) 10 mL 11  . ipratropium-albuterol (DUONEB) 0.5-2.5 (3) MG/3ML SOLN Take 3 mLs by nebulization every 6 (six) hours as needed.    . Melatonin 3 MG TABS Take 3 mg by mouth at bedtime as needed (insomnia).    . metoprolol tartrate (LOPRESSOR) 25 MG tablet Take 1 tablet (25 mg total) by mouth 2 (two) times daily.    . nitroGLYCERIN (NITROSTAT) 0.4 MG SL tablet Place 0.4 mg under the tongue every 5 (five) minutes as needed for chest pain.    Marland Kitchen ondansetron (ZOFRAN) 4 MG tablet Take 4 mg by mouth every 6 (six) hours as needed for nausea or vomiting.    . pantoprazole (PROTONIX) 40 MG tablet Take 40 mg by mouth daily.    . polyethylene glycol (MIRALAX / GLYCOLAX) packet Take 17 g by mouth every other day.    . sennosides-docusate sodium (SENOKOT-S) 8.6-50 MG tablet Take 1 tablet by mouth 2 (two) times daily.    . sodium chloride 1 G tablet Take 1 g by mouth 2 (two) times daily.    . temazepam (RESTORIL) 7.5 MG capsule Take 1 capsule (7.5 mg total) by mouth at bedtime as needed for sleep. 30 capsule 0  . torsemide (DEMADEX) 20 MG tablet Take 20 mg by mouth daily.     No current facility-administered medications on file prior to visit.   Allergies  Allergen Reactions  . Aspirin Other (See Comments)    High doses caused stomach bleeds   History   Social History  . Marital Status: Widowed    Spouse Name: N/A  . Number of Children: N/A  . Years of Education: N/A   Occupational History  . Not on file.   Social History Main Topics  . Smoking status: Never Smoker   . Smokeless tobacco: Never Used  . Alcohol Use: No  . Drug Use: No  . Sexual Activity: No   Other Topics Concern  . Not on file   Social History Narrative      Review of Systems  All  other systems reviewed and are negative.      Objective:   Physical Exam  Constitutional: She appears well-developed and well-nourished.  HENT:  Mouth/Throat: Oropharynx is clear and moist.  Eyes: Conjunctivae are normal. Pupils are equal, round, and reactive to light.  Neck: Neck supple. No thyromegaly present.  Cardiovascular:  Normal rate and regular rhythm.  Exam reveals no gallop and no friction rub.   Murmur heard. Pulmonary/Chest: Effort normal. She has no wheezes. She has rales. She exhibits no tenderness.  Abdominal: Soft. Bowel sounds are normal. She exhibits no distension and no mass. There is no tenderness. There is no rebound and no guarding.  Musculoskeletal: She exhibits edema.  Lymphadenopathy:    She has no cervical adenopathy.  Vitals reviewed.         Assessment & Plan:  IDDM (insulin dependent diabetes mellitus) - Plan: COMPLETE METABOLIC PANEL WITH GFR, CBC with Differential/Platelet, Hemoglobin A1c  Chronic atrial fibrillation  S/P mitral valve replacement with bioprosthetic valve  S/P CABG (coronary artery bypass graft)  Hx-TIA (transient ischemic attack)  Patient has trace bipedal edema and some faint left basilar crackles on examination. However given her history of chronic kidney disease I believe she is at a good euvolemic weight. Therefore I recommended that the patient weighed herself every day and notify me immediately if her weight increases more than 2 pounds above 179 or 2 pounds below 179 so that we can titrate her diuretics to maintain this euvolemic weight. She is currently in normal sinus rhythm. That coupled with her frequent falls makes me very hesitant to place this patient on Coumadin. Continue aspirin for secondary stroke prevention. There are no signs or symptoms of angina or ischemic cardiomyopathy. Her blood pressures well controlled. Her blood sugar on the other hand is very poorly controlled. I will check a hemoglobin A1c. Her fasting  blood sugars are excellent and therefore I will make no changes in her dose of Lantus. However I will increase her NovoLog with breakfast to 7 units. I will have her son call me with her prelunch sugars on Monday. I will increase NovoLog until her prelunch sugars are under 180 and then alter my attention to her pre-supper sugars. I will slowly and gradually titrate her insulin to avoid hypoglycemia. I will also recheck a CMP and a CBC to monitor her kidney function, her potassium, and her anemia of chronic disease/chronic kidney disease. If hemoglobin is less than 10 the patient would benefit from iron infusion and possible consideration for erythropoietin replacement but I will defer that to her nephrologist. I will see the patient back in 2 weeks.

## 2014-05-13 NOTE — Telephone Encounter (Signed)
Ok, please write the order.

## 2014-05-14 ENCOUNTER — Telehealth: Payer: Self-pay | Admitting: *Deleted

## 2014-05-14 DIAGNOSIS — I129 Hypertensive chronic kidney disease with stage 1 through stage 4 chronic kidney disease, or unspecified chronic kidney disease: Secondary | ICD-10-CM | POA: Diagnosis not present

## 2014-05-14 DIAGNOSIS — E119 Type 2 diabetes mellitus without complications: Secondary | ICD-10-CM | POA: Diagnosis not present

## 2014-05-14 DIAGNOSIS — D649 Anemia, unspecified: Secondary | ICD-10-CM | POA: Diagnosis not present

## 2014-05-14 DIAGNOSIS — I509 Heart failure, unspecified: Secondary | ICD-10-CM | POA: Diagnosis not present

## 2014-05-14 DIAGNOSIS — F339 Major depressive disorder, recurrent, unspecified: Secondary | ICD-10-CM | POA: Diagnosis not present

## 2014-05-14 DIAGNOSIS — I4891 Unspecified atrial fibrillation: Secondary | ICD-10-CM | POA: Diagnosis not present

## 2014-05-14 DIAGNOSIS — I10 Essential (primary) hypertension: Secondary | ICD-10-CM | POA: Diagnosis not present

## 2014-05-14 DIAGNOSIS — N184 Chronic kidney disease, stage 4 (severe): Secondary | ICD-10-CM | POA: Diagnosis not present

## 2014-05-14 DIAGNOSIS — M6281 Muscle weakness (generalized): Secondary | ICD-10-CM | POA: Diagnosis not present

## 2014-05-14 MED ORDER — METOPROLOL TARTRATE 25 MG PO TABS: 25.0000 mg | ORAL_TABLET | Freq: Two times a day (BID) | ORAL | Status: DC

## 2014-05-14 NOTE — Telephone Encounter (Signed)
Pt called stating he forgot to  Mention to Dr. Dennard Schaumann that pt needed a refill on Metoprolol 25mg  sent to [facility]  Medication sent to pharmacy

## 2014-05-15 DIAGNOSIS — M6281 Muscle weakness (generalized): Secondary | ICD-10-CM | POA: Diagnosis not present

## 2014-05-15 DIAGNOSIS — R600 Localized edema: Secondary | ICD-10-CM | POA: Diagnosis not present

## 2014-05-15 DIAGNOSIS — I10 Essential (primary) hypertension: Secondary | ICD-10-CM | POA: Diagnosis not present

## 2014-05-15 DIAGNOSIS — I129 Hypertensive chronic kidney disease with stage 1 through stage 4 chronic kidney disease, or unspecified chronic kidney disease: Secondary | ICD-10-CM | POA: Diagnosis not present

## 2014-05-15 DIAGNOSIS — I4891 Unspecified atrial fibrillation: Secondary | ICD-10-CM | POA: Diagnosis not present

## 2014-05-15 DIAGNOSIS — I509 Heart failure, unspecified: Secondary | ICD-10-CM | POA: Diagnosis not present

## 2014-05-15 DIAGNOSIS — F339 Major depressive disorder, recurrent, unspecified: Secondary | ICD-10-CM | POA: Diagnosis not present

## 2014-05-15 DIAGNOSIS — E119 Type 2 diabetes mellitus without complications: Secondary | ICD-10-CM | POA: Diagnosis not present

## 2014-05-15 DIAGNOSIS — E1122 Type 2 diabetes mellitus with diabetic chronic kidney disease: Secondary | ICD-10-CM | POA: Diagnosis not present

## 2014-05-15 DIAGNOSIS — D649 Anemia, unspecified: Secondary | ICD-10-CM | POA: Diagnosis not present

## 2014-05-15 DIAGNOSIS — N184 Chronic kidney disease, stage 4 (severe): Secondary | ICD-10-CM | POA: Diagnosis not present

## 2014-05-16 ENCOUNTER — Telehealth: Payer: Self-pay | Admitting: *Deleted

## 2014-05-16 DIAGNOSIS — D649 Anemia, unspecified: Secondary | ICD-10-CM | POA: Diagnosis not present

## 2014-05-16 DIAGNOSIS — I129 Hypertensive chronic kidney disease with stage 1 through stage 4 chronic kidney disease, or unspecified chronic kidney disease: Secondary | ICD-10-CM | POA: Diagnosis not present

## 2014-05-16 DIAGNOSIS — E119 Type 2 diabetes mellitus without complications: Secondary | ICD-10-CM | POA: Diagnosis not present

## 2014-05-16 DIAGNOSIS — N184 Chronic kidney disease, stage 4 (severe): Secondary | ICD-10-CM | POA: Diagnosis not present

## 2014-05-16 DIAGNOSIS — I4891 Unspecified atrial fibrillation: Secondary | ICD-10-CM | POA: Diagnosis not present

## 2014-05-16 DIAGNOSIS — F339 Major depressive disorder, recurrent, unspecified: Secondary | ICD-10-CM | POA: Diagnosis not present

## 2014-05-16 DIAGNOSIS — I10 Essential (primary) hypertension: Secondary | ICD-10-CM | POA: Diagnosis not present

## 2014-05-16 DIAGNOSIS — I509 Heart failure, unspecified: Secondary | ICD-10-CM | POA: Diagnosis not present

## 2014-05-16 DIAGNOSIS — M6281 Muscle weakness (generalized): Secondary | ICD-10-CM | POA: Diagnosis not present

## 2014-05-16 NOTE — Telephone Encounter (Signed)
Submitted [employer] referral thru acuity connect for authorization to Dr. Kela Millin, MD with authorization number [id number]  Requesting provider: Cammie Mcgee. Dennard Schaumann, MD  Treating provider: Earline Mayotte, MD  Number of visits:6  Start Date: 05/15/14  End Date:209/06/16  Dx: I25.10- Athscl heart disease of native coronary artery w/o ang pctrs  Copy has been sent to Cardiologist by [facility]

## 2014-05-16 NOTE — Telephone Encounter (Signed)
Home Health nurse from [facility] called stating that pt is needing refill on Temazepam 7.5mg  sent over to [facility], States she is out. Please call Katha Hamming with anyquestions at [place]

## 2014-05-16 NOTE — Telephone Encounter (Signed)
Ok please call in

## 2014-05-17 DIAGNOSIS — F339 Major depressive disorder, recurrent, unspecified: Secondary | ICD-10-CM | POA: Diagnosis not present

## 2014-05-17 DIAGNOSIS — I509 Heart failure, unspecified: Secondary | ICD-10-CM | POA: Diagnosis not present

## 2014-05-17 DIAGNOSIS — I10 Essential (primary) hypertension: Secondary | ICD-10-CM | POA: Diagnosis not present

## 2014-05-17 DIAGNOSIS — M6281 Muscle weakness (generalized): Secondary | ICD-10-CM | POA: Diagnosis not present

## 2014-05-17 DIAGNOSIS — E119 Type 2 diabetes mellitus without complications: Secondary | ICD-10-CM | POA: Diagnosis not present

## 2014-05-17 DIAGNOSIS — N184 Chronic kidney disease, stage 4 (severe): Secondary | ICD-10-CM | POA: Diagnosis not present

## 2014-05-17 DIAGNOSIS — I129 Hypertensive chronic kidney disease with stage 1 through stage 4 chronic kidney disease, or unspecified chronic kidney disease: Secondary | ICD-10-CM | POA: Diagnosis not present

## 2014-05-17 DIAGNOSIS — D649 Anemia, unspecified: Secondary | ICD-10-CM | POA: Diagnosis not present

## 2014-05-17 DIAGNOSIS — I4891 Unspecified atrial fibrillation: Secondary | ICD-10-CM | POA: Diagnosis not present

## 2014-05-19 ENCOUNTER — Other Ambulatory Visit: Payer: Self-pay | Admitting: Family Medicine

## 2014-05-19 ENCOUNTER — Telehealth: Payer: Self-pay | Admitting: Family Medicine

## 2014-05-19 DIAGNOSIS — N184 Chronic kidney disease, stage 4 (severe): Secondary | ICD-10-CM | POA: Diagnosis not present

## 2014-05-19 DIAGNOSIS — E119 Type 2 diabetes mellitus without complications: Secondary | ICD-10-CM | POA: Diagnosis not present

## 2014-05-19 DIAGNOSIS — I129 Hypertensive chronic kidney disease with stage 1 through stage 4 chronic kidney disease, or unspecified chronic kidney disease: Secondary | ICD-10-CM | POA: Diagnosis not present

## 2014-05-19 DIAGNOSIS — I4891 Unspecified atrial fibrillation: Secondary | ICD-10-CM | POA: Diagnosis not present

## 2014-05-19 DIAGNOSIS — M6281 Muscle weakness (generalized): Secondary | ICD-10-CM | POA: Diagnosis not present

## 2014-05-19 DIAGNOSIS — D649 Anemia, unspecified: Secondary | ICD-10-CM | POA: Diagnosis not present

## 2014-05-19 DIAGNOSIS — F339 Major depressive disorder, recurrent, unspecified: Secondary | ICD-10-CM | POA: Diagnosis not present

## 2014-05-19 DIAGNOSIS — I509 Heart failure, unspecified: Secondary | ICD-10-CM | POA: Diagnosis not present

## 2014-05-19 DIAGNOSIS — I10 Essential (primary) hypertension: Secondary | ICD-10-CM | POA: Diagnosis not present

## 2014-05-19 LAB — IRON AND TIBC
%SAT: 19 % — ABNORMAL LOW (ref 20–55)
Iron: 52 ug/dL (ref 42–145)
TIBC: 279 ug/dL (ref 250–470)
UIBC: 227 ug/dL (ref 125–400)

## 2014-05-19 LAB — GAMMA GT: GGT: 169 U/L — AB (ref 7–51)

## 2014-05-19 LAB — FERRITIN: Ferritin: 950 ng/mL — ABNORMAL HIGH (ref 10–291)

## 2014-05-19 MED ORDER — TRAZODONE HCL 50 MG PO TABS
50.0000 mg | ORAL_TABLET | Freq: Every day | ORAL | Status: DC
Start: 2014-05-19 — End: 2014-06-04

## 2014-05-19 NOTE — Telephone Encounter (Signed)
Script called into [employer], left on voice mail.

## 2014-05-19 NOTE — Telephone Encounter (Signed)
Received a PA on Temazepam as it is not covered on pt's ins. Per Dr. Dennard Schaumann change to Trazodone 50mg  po qhs. - Pt's son aware of change and med call to [facility],

## 2014-05-20 ENCOUNTER — Telehealth: Payer: Self-pay | Admitting: Family Medicine

## 2014-05-20 DIAGNOSIS — N184 Chronic kidney disease, stage 4 (severe): Secondary | ICD-10-CM | POA: Diagnosis not present

## 2014-05-20 DIAGNOSIS — I129 Hypertensive chronic kidney disease with stage 1 through stage 4 chronic kidney disease, or unspecified chronic kidney disease: Secondary | ICD-10-CM | POA: Diagnosis not present

## 2014-05-20 DIAGNOSIS — D649 Anemia, unspecified: Secondary | ICD-10-CM | POA: Diagnosis not present

## 2014-05-20 DIAGNOSIS — E119 Type 2 diabetes mellitus without complications: Secondary | ICD-10-CM | POA: Diagnosis not present

## 2014-05-20 DIAGNOSIS — M6281 Muscle weakness (generalized): Secondary | ICD-10-CM | POA: Diagnosis not present

## 2014-05-20 DIAGNOSIS — I509 Heart failure, unspecified: Secondary | ICD-10-CM | POA: Diagnosis not present

## 2014-05-20 DIAGNOSIS — I10 Essential (primary) hypertension: Secondary | ICD-10-CM | POA: Diagnosis not present

## 2014-05-20 DIAGNOSIS — F339 Major depressive disorder, recurrent, unspecified: Secondary | ICD-10-CM | POA: Diagnosis not present

## 2014-05-20 DIAGNOSIS — I4891 Unspecified atrial fibrillation: Secondary | ICD-10-CM | POA: Diagnosis not present

## 2014-05-20 NOTE — Telephone Encounter (Signed)
[phone] PT son called with pt blood sugar readings and weight for the past week  05/14/2014 Breakfast 153 Weight 178 Lunch 276 Dinner 230  05/15/2014 Breakfast 130 Weight 177 Lunch 130 Dinner 90  05/16/2014 Breakfast 122 Weight 177 Lunch 90 Dinner 203  05/17/2014 Breakfast 133 Weight 178 Lunch 123 Dinner 126  05/18/2014 Breakfast 78 Weight 176 Lunch 291 Dinner 136  05/19/2014 Breakfast 77 Weight 174 Lunch 352 Dinner 122  05/20/2014 Breakfast 95 Weight 172.8

## 2014-05-21 DIAGNOSIS — M6281 Muscle weakness (generalized): Secondary | ICD-10-CM | POA: Diagnosis not present

## 2014-05-21 DIAGNOSIS — I509 Heart failure, unspecified: Secondary | ICD-10-CM | POA: Diagnosis not present

## 2014-05-21 DIAGNOSIS — N184 Chronic kidney disease, stage 4 (severe): Secondary | ICD-10-CM | POA: Diagnosis not present

## 2014-05-21 DIAGNOSIS — E119 Type 2 diabetes mellitus without complications: Secondary | ICD-10-CM | POA: Diagnosis not present

## 2014-05-21 DIAGNOSIS — D649 Anemia, unspecified: Secondary | ICD-10-CM | POA: Diagnosis not present

## 2014-05-21 DIAGNOSIS — F339 Major depressive disorder, recurrent, unspecified: Secondary | ICD-10-CM | POA: Diagnosis not present

## 2014-05-21 DIAGNOSIS — I10 Essential (primary) hypertension: Secondary | ICD-10-CM | POA: Diagnosis not present

## 2014-05-21 DIAGNOSIS — I129 Hypertensive chronic kidney disease with stage 1 through stage 4 chronic kidney disease, or unspecified chronic kidney disease: Secondary | ICD-10-CM | POA: Diagnosis not present

## 2014-05-21 DIAGNOSIS — I4891 Unspecified atrial fibrillation: Secondary | ICD-10-CM | POA: Diagnosis not present

## 2014-05-22 DIAGNOSIS — I509 Heart failure, unspecified: Secondary | ICD-10-CM | POA: Diagnosis not present

## 2014-05-22 DIAGNOSIS — E119 Type 2 diabetes mellitus without complications: Secondary | ICD-10-CM | POA: Diagnosis not present

## 2014-05-22 DIAGNOSIS — N184 Chronic kidney disease, stage 4 (severe): Secondary | ICD-10-CM | POA: Diagnosis not present

## 2014-05-22 DIAGNOSIS — I10 Essential (primary) hypertension: Secondary | ICD-10-CM | POA: Diagnosis not present

## 2014-05-22 DIAGNOSIS — I4891 Unspecified atrial fibrillation: Secondary | ICD-10-CM | POA: Diagnosis not present

## 2014-05-22 DIAGNOSIS — M6281 Muscle weakness (generalized): Secondary | ICD-10-CM | POA: Diagnosis not present

## 2014-05-22 DIAGNOSIS — I129 Hypertensive chronic kidney disease with stage 1 through stage 4 chronic kidney disease, or unspecified chronic kidney disease: Secondary | ICD-10-CM | POA: Diagnosis not present

## 2014-05-22 DIAGNOSIS — D649 Anemia, unspecified: Secondary | ICD-10-CM | POA: Diagnosis not present

## 2014-05-22 DIAGNOSIS — F339 Major depressive disorder, recurrent, unspecified: Secondary | ICD-10-CM | POA: Diagnosis not present

## 2014-05-22 NOTE — Telephone Encounter (Signed)
Increase novolog at breakfast to 10 units.   Otherwise, other sugars are good.

## 2014-05-22 NOTE — Telephone Encounter (Signed)
Pt's son aware.

## 2014-05-23 ENCOUNTER — Telehealth: Payer: Self-pay | Admitting: *Deleted

## 2014-05-23 NOTE — Telephone Encounter (Signed)
Received call from [facility] stating that she had sent in order for pt to have a hospital bed, 3-1 bedside commode and a walker and needs it to be sent to [phone], I informed her that this was taking care of and sent to [facility] [employer] home [employer] and was faxed to them on 05/13/14, she states she called them and stated they never received the order, I refaxed to [facility] orders

## 2014-05-26 ENCOUNTER — Telehealth: Payer: Self-pay | Admitting: Family Medicine

## 2014-05-26 DIAGNOSIS — I10 Essential (primary) hypertension: Secondary | ICD-10-CM | POA: Diagnosis not present

## 2014-05-26 DIAGNOSIS — I129 Hypertensive chronic kidney disease with stage 1 through stage 4 chronic kidney disease, or unspecified chronic kidney disease: Secondary | ICD-10-CM | POA: Diagnosis not present

## 2014-05-26 DIAGNOSIS — I509 Heart failure, unspecified: Secondary | ICD-10-CM | POA: Diagnosis not present

## 2014-05-26 DIAGNOSIS — N184 Chronic kidney disease, stage 4 (severe): Secondary | ICD-10-CM | POA: Diagnosis not present

## 2014-05-26 DIAGNOSIS — M6281 Muscle weakness (generalized): Secondary | ICD-10-CM | POA: Diagnosis not present

## 2014-05-26 DIAGNOSIS — I4891 Unspecified atrial fibrillation: Secondary | ICD-10-CM | POA: Diagnosis not present

## 2014-05-26 DIAGNOSIS — F339 Major depressive disorder, recurrent, unspecified: Secondary | ICD-10-CM | POA: Diagnosis not present

## 2014-05-26 DIAGNOSIS — D649 Anemia, unspecified: Secondary | ICD-10-CM | POA: Diagnosis not present

## 2014-05-26 DIAGNOSIS — E119 Type 2 diabetes mellitus without complications: Secondary | ICD-10-CM | POA: Diagnosis not present

## 2014-05-26 NOTE — Telephone Encounter (Signed)
Called and spoke to pt's son and he states that she has gained not lost 9 pounds. He also informed me that PT was out today and they checked her over very well and did a BP and it was 106/50 something. He also states that she is not having any SOB or balloon like swelling in her extremities. She does have an appt for 05/27/14 at 4. Told him that if she gets worse, sob or c/o CP to take her to ER. He verbalized understanding.

## 2014-05-26 NOTE — Telephone Encounter (Signed)
Any suggestions before I call him back?

## 2014-05-26 NOTE — Telephone Encounter (Signed)
If she has truly lost 9 lbs, hold fluid pill and recheck weight in 24 hrs and notify us of the change.

## 2014-05-26 NOTE — Telephone Encounter (Signed)
[phone] Patients son arnold calling to speak with you regarding his mom's 9 pound weight loss in one day

## 2014-05-27 ENCOUNTER — Ambulatory Visit: Payer: Medicare HMO | Admitting: Family Medicine

## 2014-05-27 ENCOUNTER — Ambulatory Visit (INDEPENDENT_AMBULATORY_CARE_PROVIDER_SITE_OTHER): Payer: Medicare HMO | Admitting: Family Medicine

## 2014-05-27 ENCOUNTER — Encounter: Payer: Self-pay | Admitting: Family Medicine

## 2014-05-27 VITALS — BP 84/60 | HR 72 | Temp 97.5°F | Resp 18 | Wt 194.0 lb

## 2014-05-27 DIAGNOSIS — J810 Acute pulmonary edema: Secondary | ICD-10-CM

## 2014-05-27 DIAGNOSIS — I509 Heart failure, unspecified: Secondary | ICD-10-CM | POA: Diagnosis not present

## 2014-05-27 DIAGNOSIS — E119 Type 2 diabetes mellitus without complications: Secondary | ICD-10-CM | POA: Diagnosis not present

## 2014-05-27 DIAGNOSIS — I10 Essential (primary) hypertension: Secondary | ICD-10-CM | POA: Diagnosis not present

## 2014-05-27 DIAGNOSIS — F339 Major depressive disorder, recurrent, unspecified: Secondary | ICD-10-CM | POA: Diagnosis not present

## 2014-05-27 DIAGNOSIS — M6281 Muscle weakness (generalized): Secondary | ICD-10-CM | POA: Diagnosis not present

## 2014-05-27 DIAGNOSIS — I4891 Unspecified atrial fibrillation: Secondary | ICD-10-CM | POA: Diagnosis not present

## 2014-05-27 DIAGNOSIS — D649 Anemia, unspecified: Secondary | ICD-10-CM | POA: Diagnosis not present

## 2014-05-27 DIAGNOSIS — I129 Hypertensive chronic kidney disease with stage 1 through stage 4 chronic kidney disease, or unspecified chronic kidney disease: Secondary | ICD-10-CM | POA: Diagnosis not present

## 2014-05-27 DIAGNOSIS — N184 Chronic kidney disease, stage 4 (severe): Secondary | ICD-10-CM | POA: Diagnosis not present

## 2014-05-27 NOTE — Progress Notes (Signed)
Subjective:    Patient ID: Claudia [name], female    DOB: [birth year], 81 y.o.   MRN: [id number]  HPI  Please see my last office visit. We obtain the patient's renal function her creatinine had risen steadily. Patient also appears slightly dehydrated. Therefore we asked the patient to hold her torsemide and weight herself every day and we were planning to resume the torsemide if she gains more than 2 pounds. Patient weighed 179 pounds in my office at that office visit. Today she is 194 pounds. According to her son she gained this overnight. There have been no sudden changes in her diet. She denies eating more salt. She does report some mild shortness of breath. She reports weakness. She reports dizziness upon standing. Her blood pressure today is low at 84/60. She also has fallen and has a skin tear on her left elbow. It is approximately 8 cm x 4 cm. It is very superficial and it overlies the olecranon process. Past Medical History  Diagnosis Date  . GERD (gastroesophageal reflux disease)   . PUD (peptic ulcer disease)   . Anemia   . Hypertension   . S/P endoscopy Aug 2011    3 superficial gastric ulcers, NSAID-induced  . S/P colonoscopy Sept 2011    left-sided diverticula, tubular adenoma  . Coronary artery disease   . Shortness of breath   . Diabetes mellitus     insulin dependent  . Headache(784.0)     rare migraines  . Cancer     hx of skin cancer  . Arthritis   . Osteopenia   . Hypercholesterolemia   . SIADH (syndrome of inappropriate ADH production)   . CHF (congestive heart failure)   . Myocardial infarction 2013  . Chronic renal failure, stage 3 (moderate)    Past Surgical History  Procedure Laterality Date  . Tonsillectomy    . Appendectomy    . Eye surgery      cataracts  . Cardiac stents  07/19/2011  . Esophagogastroduodenoscopy  10/16/09    normal without barrett's/three superficial gastric ulcers  . Colonoscopy  12/04/09    normal rectum/left-sided diverticula    . Joint replacement  arthroscopy to knee  . Toe amputation Left 2013    left second toe amputation  . Cardiac catheterization  01/22/2013  . Coronary angioplasty  07/2011  . Coronary artery bypass graft N/A 11/08/2013    Procedure: CORONARY ARTERY BYPASS GRAFTING (CABG), on pump, times two, using left internal mammary artery, cryo saphenous vein.;  Surgeon: Ivin Poot, MD;  Location: [place];  Service: Open Heart Surgery;  Laterality: N/A;  LIMA-LAD CRYOVEIN -OM  . Intraoperative transesophageal echocardiogram N/A 11/08/2013    Procedure: INTRAOPERATIVE TRANSESOPHAGEAL ECHOCARDIOGRAM;  Surgeon: Ivin Poot, MD;  Location: [place];  Service: Open Heart Surgery;  Laterality: N/A;  . Mitral valve replacement N/A 11/08/2013    Procedure: MITRAL VALVE (MV) REPLACEMENT;  Surgeon: Ivin Poot, MD;  Location: [place];  Service: Open Heart Surgery;  Laterality: N/A;  #25 MAGNA MITRAL EASE  . Left heart catheterization with coronary angiogram N/A 07/19/2011    Procedure: LEFT HEART CATHETERIZATION WITH CORONARY ANGIOGRAM;  Surgeon: Laverda Page, MD;  Location: [facility] CATH LAB;  Service: Cardiovascular;  Laterality: N/A;  . Left heart catheterization with coronary angiogram N/A 12/21/2011    Procedure: LEFT HEART CATHETERIZATION WITH CORONARY ANGIOGRAM;  Surgeon: Laverda Page, MD;  Location: [facility] CATH LAB;  Service: Cardiovascular;  Laterality: N/A;  .  Left heart catheterization with coronary angiogram N/A 02/20/2012    Procedure: LEFT HEART CATHETERIZATION WITH CORONARY ANGIOGRAM;  Surgeon: Laverda Page, MD;  Location: [facility] CATH LAB;  Service: Cardiovascular;  Laterality: N/A;  . Left heart catheterization with coronary angiogram N/A 09/03/2012    Procedure: LEFT HEART CATHETERIZATION WITH CORONARY ANGIOGRAM;  Surgeon: Laverda Page, MD;  Location: [facility] CATH LAB;  Service: Cardiovascular;  Laterality: N/A;  . Percutaneous coronary stent intervention (pci-s)  09/03/2012    Procedure: PERCUTANEOUS  CORONARY STENT INTERVENTION (PCI-S);  Surgeon: Laverda Page, MD;  Location: [facility] CATH LAB;  Service: Cardiovascular;;  . Left heart catheterization with coronary angiogram N/A 12/04/2012    Procedure: LEFT HEART CATHETERIZATION WITH CORONARY ANGIOGRAM;  Surgeon: Laverda Page, MD;  Location: [facility] CATH LAB;  Service: Cardiovascular;  Laterality: N/A;  . Left heart catheterization with coronary angiogram N/A 01/22/2013    Procedure: LEFT HEART CATHETERIZATION WITH CORONARY ANGIOGRAM;  Surgeon: Laverda Page, MD;  Location: [facility] CATH LAB;  Service: Cardiovascular;  Laterality: N/A;  . Left heart catheterization with coronary angiogram N/A 06/26/2013    Procedure: LEFT HEART CATHETERIZATION WITH CORONARY ANGIOGRAM;  Surgeon: Laverda Page, MD;  Location: [facility] CATH LAB;  Service: Cardiovascular;  Laterality: N/A;  . Left heart catheterization with coronary angiogram N/A 11/05/2013    Procedure: LEFT HEART CATHETERIZATION WITH CORONARY ANGIOGRAM;  Surgeon: Laverda Page, MD;  Location: [facility] CATH LAB;  Service: Cardiovascular;  Laterality: N/A;   Current Outpatient Prescriptions on File Prior to Visit  Medication Sig Dispense Refill  . acetaminophen (TYLENOL) 500 MG tablet Take 1,000 mg by mouth every 6 (six) hours as needed.    Marland Kitchen ascorbic acid (VITAMIN C) 500 MG tablet Take 500 mg by mouth 2 (two) times daily.    Marland Kitchen aspirin 81 MG chewable tablet Chew 2 tablets (162 mg total) by mouth daily.    . cetirizine (ZYRTEC) 10 MG tablet Take 10 mg by mouth at bedtime.    . feeding supplement, GLUCERNA SHAKE, (GLUCERNA SHAKE) LIQD Take 237 mLs by mouth 3 (three) times daily with meals.  0  . ferrous sulfate 325 (65 FE) MG tablet Take 325 mg by mouth 2 (two) times daily with a meal.    . fluticasone (FLONASE) 50 MCG/ACT nasal spray Place 1 spray into both nostrils daily. 16 g 2  . insulin aspart (NOVOLOG FLEXPEN) 100 UNIT/ML FlexPen Inject 5-10 Units into the skin 3 (three) times daily with meals. 70-120=0     251-300=5 121-150=1  301-350=7 151-200=2  351-400=9 201-250=3  400 or >, call MD  units of insulin (Patient taking differently: Inject 12 Units into the skin 2 (two) times daily with a meal. ) 15 mL 11  . insulin glargine (LANTUS) 100 UNIT/ML injection Inject 0.2 mLs (20 Units total) into the skin 2 (two) times daily. (Patient taking differently: Inject 12 Units into the skin 2 (two) times daily. ) 10 mL 11  . ipratropium-albuterol (DUONEB) 0.5-2.5 (3) MG/3ML SOLN Take 3 mLs by nebulization every 6 (six) hours as needed.    . Melatonin 3 MG TABS Take 3 mg by mouth at bedtime as needed (insomnia).    . metoprolol tartrate (LOPRESSOR) 25 MG tablet Take 1 tablet (25 mg total) by mouth 2 (two) times daily. 60 tablet 3  . nitroGLYCERIN (NITROSTAT) 0.4 MG SL tablet Place 0.4 mg under the tongue every 5 (five) minutes as needed for chest pain.    Marland Kitchen ondansetron (ZOFRAN) 4 MG  tablet Take 4 mg by mouth every 6 (six) hours as needed for nausea or vomiting.    . pantoprazole (PROTONIX) 40 MG tablet Take 40 mg by mouth daily.    . polyethylene glycol (MIRALAX / GLYCOLAX) packet Take 17 g by mouth every other day.    . sennosides-docusate sodium (SENOKOT-S) 8.6-50 MG tablet Take 1 tablet by mouth 2 (two) times daily.    . simvastatin (ZOCOR) 20 MG tablet Take 20 mg by mouth daily.    . sodium chloride 1 G tablet Take 1 g by mouth 2 (two) times daily.    . traZODone (DESYREL) 50 MG tablet Take 1 tablet (50 mg total) by mouth at bedtime. 30 tablet 2  . UNABLE TO FIND 1 each by Does not apply route once. [place] BED 1 each o  . UNABLE TO FIND 1 each by Does not apply route once. Deliver ONE BEDSIDE COMMODE 1 each 0  . UNABLE TO FIND 1 each by Does not apply route once. Deliver ONE ROLLING WALKER WITH SEAT 1 each 0  . torsemide (DEMADEX) 20 MG tablet Take 20 mg by mouth daily.     No current facility-administered medications on file prior to visit.   Allergies  Allergen Reactions  . Aspirin  Other (See Comments)    High doses caused stomach bleeds   History   Social History  . Marital Status: Widowed    Spouse Name: N/A  . Number of Children: N/A  . Years of Education: N/A   Occupational History  . Not on file.   Social History Main Topics  . Smoking status: Never Smoker   . Smokeless tobacco: Never Used  . Alcohol Use: No  . Drug Use: No  . Sexual Activity: No   Other Topics Concern  . Not on file   Social History Narrative     Review of Systems  All other systems reviewed and are negative.      Objective:   Physical Exam  Neck: No JVD present.  Cardiovascular: Normal rate, regular rhythm and normal heart sounds.   Pulmonary/Chest: Effort normal. She has rales.  Abdominal: Soft. Bowel sounds are normal. She exhibits no distension and no mass. There is no tenderness. There is no rebound and no guarding.  Musculoskeletal: She exhibits edema.  Vitals reviewed. Patient has bibasilar crackles. She also has +1 pitting edema in both legs. There is no JVD. She does appear weak and tired. She also is fluid overloaded on exam today.        Assessment & Plan:  Acute pulmonary edema  Patient requires diuresis. I recommended they take 40 mg of torsemide right now in the office. I then want him to resume torsemide 20 mg by mouth twice a day and I will recheck her in 48 hours. She is to go to the hospital immediately if her symptoms worsen. I'm concerned about hypotension given aggressive diuresis and therefore I recommended that he decrease the metoprolol to one half a tablet which is 12.5 mg by mouth twice a day. Recheck Thursday and reassess or immediately if worse.

## 2014-05-29 ENCOUNTER — Ambulatory Visit (INDEPENDENT_AMBULATORY_CARE_PROVIDER_SITE_OTHER): Payer: Commercial Managed Care - HMO | Admitting: Family Medicine

## 2014-05-29 ENCOUNTER — Encounter: Payer: Self-pay | Admitting: Family Medicine

## 2014-05-29 VITALS — BP 110/80 | HR 62 | Temp 98.5°F | Resp 18 | Wt 188.0 lb

## 2014-05-29 DIAGNOSIS — I509 Heart failure, unspecified: Secondary | ICD-10-CM

## 2014-05-29 DIAGNOSIS — W1809XA Striking against other object with subsequent fall, initial encounter: Secondary | ICD-10-CM | POA: Diagnosis not present

## 2014-05-29 DIAGNOSIS — J810 Acute pulmonary edema: Secondary | ICD-10-CM

## 2014-05-29 DIAGNOSIS — R531 Weakness: Secondary | ICD-10-CM

## 2014-05-29 MED ORDER — CIPROFLOXACIN HCL 250 MG PO TABS
250.0000 mg | ORAL_TABLET | Freq: Two times a day (BID) | ORAL | Status: DC
Start: 2014-05-29 — End: 2014-06-17

## 2014-05-29 NOTE — Progress Notes (Signed)
Subjective:    Patient ID: Claudia [name], female    DOB: [birth year], 81 y.o.   MRN: [id number]  HPI  05/27/14 Please see my last office visit. We obtained the patient's renal function and her creatinine had risen steadily. Patient also appeared slightly dehydrated. Therefore we asked the patient to hold her torsemide and weight herself every day and we were planning to resume the torsemide if she gains more than 2 pounds. Patient weighed 179 pounds in my office at that office visit. Today she is 194 pounds. According to her son she gained this overnight. There have been no sudden changes in her diet. She denies eating more salt. She does report some mild shortness of breath. She reports weakness. She reports dizziness upon standing. Her blood pressure today is low at 84/60. She also has fallen and has a skin tear on her left elbow. It is approximately 8 cm x 4 cm. It is very superficial and it overlies the olecranon process.  At that time, my plan was: Patient requires diuresis. I recommended they take 40 mg of torsemide right now in the office. I then want him to resume torsemide 20 mg by mouth twice a day and I will recheck her in 48 hours. She is to go to the hospital immediately if her symptoms worsen. I'm concerned about hypotension given aggressive diuresis and therefore I recommended that he decrease the metoprolol to one half a tablet which is 12.5 mg by mouth twice a day. Recheck Thursday and reassess or immediately if worse.  05/29/14 Patient's weight is down to 188 pounds. This is 6 pounds better than it was 2 days ago. Her oxygen saturation is 99% on room air. Her blood pressure is also better at 110/80. Unfortunately the patient continues to feel weak. She appears very lethargic. Son states that she is intermittently confused at home. The patient's symptoms began suddenly on Sunday and Monday. It does make me worry that there is an underlying precipitating cause beyond just the fluid  overload and edema. Clinically the patient's edema is better however patient does not appear to be improving regarding her weakness and deconditioning. She does report dysuria on urination. That coupled with her intermittent delirium makes me concerned about possible urinary tract infection. Patient is also complaining of pain in her left shoulder from where she fell the other day. She is tender to palpation over the distal acromion. Past Medical History  Diagnosis Date  . GERD (gastroesophageal reflux disease)   . PUD (peptic ulcer disease)   . Anemia   . Hypertension   . S/P endoscopy Aug 2011    3 superficial gastric ulcers, NSAID-induced  . S/P colonoscopy Sept 2011    left-sided diverticula, tubular adenoma  . Coronary artery disease   . Shortness of breath   . Diabetes mellitus     insulin dependent  . Headache(784.0)     rare migraines  . Cancer     hx of skin cancer  . Arthritis   . Osteopenia   . Hypercholesterolemia   . SIADH (syndrome of inappropriate ADH production)   . CHF (congestive heart failure)   . Myocardial infarction 2013  . Chronic renal failure, stage 3 (moderate)    Past Surgical History  Procedure Laterality Date  . Tonsillectomy    . Appendectomy    . Eye surgery      cataracts  . Cardiac stents  07/19/2011  . Esophagogastroduodenoscopy  10/16/09    normal without  barrett's/three superficial gastric ulcers  . Colonoscopy  12/04/09    normal rectum/left-sided diverticula  . Joint replacement  arthroscopy to knee  . Toe amputation Left 2013    left second toe amputation  . Cardiac catheterization  01/22/2013  . Coronary angioplasty  07/2011  . Coronary artery bypass graft N/A 11/08/2013    Procedure: CORONARY ARTERY BYPASS GRAFTING (CABG), on pump, times two, using left internal mammary artery, cryo saphenous vein.;  Surgeon: Ivin Poot, MD;  Location: [place];  Service: Open Heart Surgery;  Laterality: N/A;  LIMA-LAD CRYOVEIN -OM  . Intraoperative  transesophageal echocardiogram N/A 11/08/2013    Procedure: INTRAOPERATIVE TRANSESOPHAGEAL ECHOCARDIOGRAM;  Surgeon: Ivin Poot, MD;  Location: [place];  Service: Open Heart Surgery;  Laterality: N/A;  . Mitral valve replacement N/A 11/08/2013    Procedure: MITRAL VALVE (MV) REPLACEMENT;  Surgeon: Ivin Poot, MD;  Location: [place];  Service: Open Heart Surgery;  Laterality: N/A;  #25 MAGNA MITRAL EASE  . Left heart catheterization with coronary angiogram N/A 07/19/2011    Procedure: LEFT HEART CATHETERIZATION WITH CORONARY ANGIOGRAM;  Surgeon: Laverda Page, MD;  Location: [facility] CATH LAB;  Service: Cardiovascular;  Laterality: N/A;  . Left heart catheterization with coronary angiogram N/A 12/21/2011    Procedure: LEFT HEART CATHETERIZATION WITH CORONARY ANGIOGRAM;  Surgeon: Laverda Page, MD;  Location: [facility] CATH LAB;  Service: Cardiovascular;  Laterality: N/A;  . Left heart catheterization with coronary angiogram N/A 02/20/2012    Procedure: LEFT HEART CATHETERIZATION WITH CORONARY ANGIOGRAM;  Surgeon: Laverda Page, MD;  Location: [facility] CATH LAB;  Service: Cardiovascular;  Laterality: N/A;  . Left heart catheterization with coronary angiogram N/A 09/03/2012    Procedure: LEFT HEART CATHETERIZATION WITH CORONARY ANGIOGRAM;  Surgeon: Laverda Page, MD;  Location: [facility] CATH LAB;  Service: Cardiovascular;  Laterality: N/A;  . Percutaneous coronary stent intervention (pci-s)  09/03/2012    Procedure: PERCUTANEOUS CORONARY STENT INTERVENTION (PCI-S);  Surgeon: Laverda Page, MD;  Location: [facility] CATH LAB;  Service: Cardiovascular;;  . Left heart catheterization with coronary angiogram N/A 12/04/2012    Procedure: LEFT HEART CATHETERIZATION WITH CORONARY ANGIOGRAM;  Surgeon: Laverda Page, MD;  Location: [facility] CATH LAB;  Service: Cardiovascular;  Laterality: N/A;  . Left heart catheterization with coronary angiogram N/A 01/22/2013    Procedure: LEFT HEART CATHETERIZATION WITH CORONARY ANGIOGRAM;   Surgeon: Laverda Page, MD;  Location: [facility] CATH LAB;  Service: Cardiovascular;  Laterality: N/A;  . Left heart catheterization with coronary angiogram N/A 06/26/2013    Procedure: LEFT HEART CATHETERIZATION WITH CORONARY ANGIOGRAM;  Surgeon: Laverda Page, MD;  Location: [facility] CATH LAB;  Service: Cardiovascular;  Laterality: N/A;  . Left heart catheterization with coronary angiogram N/A 11/05/2013    Procedure: LEFT HEART CATHETERIZATION WITH CORONARY ANGIOGRAM;  Surgeon: Laverda Page, MD;  Location: [facility] CATH LAB;  Service: Cardiovascular;  Laterality: N/A;   Current Outpatient Prescriptions on File Prior to Visit  Medication Sig Dispense Refill  . acetaminophen (TYLENOL) 500 MG tablet Take 1,000 mg by mouth every 6 (six) hours as needed.    Marland Kitchen ascorbic acid (VITAMIN C) 500 MG tablet Take 500 mg by mouth 2 (two) times daily.    Marland Kitchen aspirin 81 MG chewable tablet Chew 2 tablets (162 mg total) by mouth daily.    . cetirizine (ZYRTEC) 10 MG tablet Take 10 mg by mouth at bedtime.    . feeding supplement, GLUCERNA SHAKE, (GLUCERNA SHAKE) LIQD Take 237 mLs  by mouth 3 (three) times daily with meals.  0  . ferrous sulfate 325 (65 FE) MG tablet Take 325 mg by mouth 2 (two) times daily with a meal.    . fluticasone (FLONASE) 50 MCG/ACT nasal spray Place 1 spray into both nostrils daily. 16 g 2  . insulin aspart (NOVOLOG FLEXPEN) 100 UNIT/ML FlexPen Inject 5-10 Units into the skin 3 (three) times daily with meals. 70-120=0    251-300=5 121-150=1  301-350=7 151-200=2  351-400=9 201-250=3  400 or >, call MD  units of insulin (Patient taking differently: Inject 12 Units into the skin 2 (two) times daily with a meal. ) 15 mL 11  . insulin glargine (LANTUS) 100 UNIT/ML injection Inject 0.2 mLs (20 Units total) into the skin 2 (two) times daily. (Patient taking differently: Inject 12 Units into the skin 2 (two) times daily. ) 10 mL 11  . ipratropium-albuterol (DUONEB) 0.5-2.5 (3) MG/3ML SOLN Take 3 mLs by  nebulization every 6 (six) hours as needed.    . Melatonin 3 MG TABS Take 3 mg by mouth at bedtime as needed (insomnia).    . metoprolol tartrate (LOPRESSOR) 25 MG tablet Take 1 tablet (25 mg total) by mouth 2 (two) times daily. 60 tablet 3  . nitroGLYCERIN (NITROSTAT) 0.4 MG SL tablet Place 0.4 mg under the tongue every 5 (five) minutes as needed for chest pain.    Marland Kitchen ondansetron (ZOFRAN) 4 MG tablet Take 4 mg by mouth every 6 (six) hours as needed for nausea or vomiting.    . pantoprazole (PROTONIX) 40 MG tablet Take 40 mg by mouth daily.    . polyethylene glycol (MIRALAX / GLYCOLAX) packet Take 17 g by mouth every other day.    . sennosides-docusate sodium (SENOKOT-S) 8.6-50 MG tablet Take 1 tablet by mouth 2 (two) times daily.    . simvastatin (ZOCOR) 20 MG tablet Take 20 mg by mouth daily.    . sodium chloride 1 G tablet Take 1 g by mouth 2 (two) times daily.    Marland Kitchen torsemide (DEMADEX) 20 MG tablet Take 20 mg by mouth daily.    . traZODone (DESYREL) 50 MG tablet Take 1 tablet (50 mg total) by mouth at bedtime. 30 tablet 2  . UNABLE TO FIND 1 each by Does not apply route once. [place] BED 1 each o  . UNABLE TO FIND 1 each by Does not apply route once. Deliver ONE BEDSIDE COMMODE 1 each 0  . UNABLE TO FIND 1 each by Does not apply route once. Deliver ONE ROLLING WALKER WITH SEAT 1 each 0   No current facility-administered medications on file prior to visit.   Allergies  Allergen Reactions  . Aspirin Other (See Comments)    High doses caused stomach bleeds   History   Social History  . Marital Status: Widowed    Spouse Name: N/A  . Number of Children: N/A  . Years of Education: N/A   Occupational History  . Not on file.   Social History Main Topics  . Smoking status: Never Smoker   . Smokeless tobacco: Never Used  . Alcohol Use: No  . Drug Use: No  . Sexual Activity: No   Other Topics Concern  . Not on file   Social History Narrative     Review of Systems    All other systems reviewed and are negative.      Objective:   Physical Exam  Neck: No JVD present.  Cardiovascular: Normal  rate, regular rhythm and normal heart sounds.   Pulmonary/Chest: Effort normal. She has rales.  Abdominal: Soft. Bowel sounds are normal. She exhibits no distension and no mass. There is no tenderness. There is no rebound and no guarding.  Musculoskeletal: She exhibits edema.  Vitals reviewed. Patient has bibasilar crackles. She also has +1 pitting edema in both legs. There is no JVD. She does appear weak and tired. She also is fluid overloaded on exam today.        Assessment & Plan:  Acute pulmonary edema - Plan: COMPLETE METABOLIC PANEL WITH GFR, CBC with Differential/Platelet, DG Chest 2 View  CHF (congestive heart failure), NYHA class III, unspecified failure chronicity, unspecified type - Plan: COMPLETE METABOLIC PANEL WITH GFR, CBC with Differential/Platelet, DG Chest 2 View  Fall against object, initial encounter - Plan: DG Shoulder Left  Weakness - Plan: ciprofloxacin (CIPRO) 250 MG tablet, Urinalysis, Routine w reflex microscopic, Urine culture  Patient is unable to void today in clinic. I will give her the necessary equipment to collect a urine sample at home. I am concerned the patient has an underlying urinary tract infection possibly precipitating the sudden weakness, confusion, and deconditioning. Given her age and frailty I will empirically begin her on Cipro 250 mg by mouth twice a day until I have the requisite urine sample. I will also send for culture. Meanwhile I will obtain an x-ray of the chest as well as the shoulder. The x-ray of the shoulder is to rule out a fracture. As read the chest is to rule out an underlying pneumonia as opposed to pulmonary edema which I believe is what I am auscultating in the lungs. Continue torsemide 20 mg by mouth twice a day until the patient's weight is under 179 pounds. I will also check a CBC and CMP today to  monitor the patient's electrolytes, renal function, and to evaluate for leukocytosis. Recheck Monday or immediately if worse patient has my pager number to call me over the weekend in case questions arise.

## 2014-05-30 ENCOUNTER — Other Ambulatory Visit: Payer: Commercial Managed Care - HMO

## 2014-05-30 ENCOUNTER — Ambulatory Visit (HOSPITAL_COMMUNITY)
Admission: RE | Admit: 2014-05-30 | Discharge: 2014-05-30 | Disposition: A | Discharge status: Home / Self Care | Payer: Commercial Managed Care - HMO | Source: Ambulatory Visit | Attending: Family Medicine | Admitting: Family Medicine

## 2014-05-30 DIAGNOSIS — M25512 Pain in left shoulder: Secondary | ICD-10-CM | POA: Insufficient documentation

## 2014-05-30 DIAGNOSIS — J810 Acute pulmonary edema: Secondary | ICD-10-CM

## 2014-05-30 DIAGNOSIS — R0602 Shortness of breath: Secondary | ICD-10-CM | POA: Diagnosis not present

## 2014-05-30 DIAGNOSIS — Z951 Presence of aortocoronary bypass graft: Secondary | ICD-10-CM | POA: Diagnosis not present

## 2014-05-30 DIAGNOSIS — M6281 Muscle weakness (generalized): Secondary | ICD-10-CM | POA: Diagnosis not present

## 2014-05-30 DIAGNOSIS — Z952 Presence of prosthetic heart valve: Secondary | ICD-10-CM | POA: Insufficient documentation

## 2014-05-30 DIAGNOSIS — W1809XA Striking against other object with subsequent fall, initial encounter: Secondary | ICD-10-CM | POA: Insufficient documentation

## 2014-05-30 DIAGNOSIS — N184 Chronic kidney disease, stage 4 (severe): Secondary | ICD-10-CM | POA: Diagnosis not present

## 2014-05-30 DIAGNOSIS — R531 Weakness: Secondary | ICD-10-CM | POA: Diagnosis not present

## 2014-05-30 DIAGNOSIS — Z955 Presence of coronary angioplasty implant and graft: Secondary | ICD-10-CM | POA: Diagnosis not present

## 2014-05-30 DIAGNOSIS — E119 Type 2 diabetes mellitus without complications: Secondary | ICD-10-CM | POA: Diagnosis not present

## 2014-05-30 DIAGNOSIS — I129 Hypertensive chronic kidney disease with stage 1 through stage 4 chronic kidney disease, or unspecified chronic kidney disease: Secondary | ICD-10-CM | POA: Diagnosis not present

## 2014-05-30 DIAGNOSIS — I4891 Unspecified atrial fibrillation: Secondary | ICD-10-CM | POA: Diagnosis not present

## 2014-05-30 DIAGNOSIS — I509 Heart failure, unspecified: Secondary | ICD-10-CM

## 2014-05-30 DIAGNOSIS — S4992XA Unspecified injury of left shoulder and upper arm, initial encounter: Secondary | ICD-10-CM | POA: Diagnosis not present

## 2014-05-30 DIAGNOSIS — D649 Anemia, unspecified: Secondary | ICD-10-CM | POA: Diagnosis not present

## 2014-05-30 DIAGNOSIS — I10 Essential (primary) hypertension: Secondary | ICD-10-CM | POA: Diagnosis not present

## 2014-05-30 DIAGNOSIS — F339 Major depressive disorder, recurrent, unspecified: Secondary | ICD-10-CM | POA: Diagnosis not present

## 2014-05-30 LAB — COMPLETE METABOLIC PANEL WITH GFR
ALT: 40 U/L — AB (ref 0–35)
AST: 35 U/L (ref 0–37)
Albumin: 3.4 g/dL — ABNORMAL LOW (ref 3.5–5.2)
Alkaline Phosphatase: 381 U/L — ABNORMAL HIGH (ref 39–117)
BUN: 83 mg/dL — ABNORMAL HIGH (ref 6–23)
CALCIUM: 8.6 mg/dL (ref 8.4–10.5)
CHLORIDE: 97 meq/L (ref 96–112)
CO2: 21 mEq/L (ref 19–32)
Creat: 2.52 mg/dL — ABNORMAL HIGH (ref 0.50–1.10)
GFR, EST AFRICAN AMERICAN: 20 mL/min — AB
GFR, EST NON AFRICAN AMERICAN: 17 mL/min — AB
Glucose, Bld: 105 mg/dL — ABNORMAL HIGH (ref 70–99)
POTASSIUM: 4.6 meq/L (ref 3.5–5.3)
SODIUM: 130 meq/L — AB (ref 135–145)
Total Bilirubin: 0.8 mg/dL (ref 0.2–1.2)
Total Protein: 6.9 g/dL (ref 6.0–8.3)

## 2014-05-30 LAB — CBC WITH DIFFERENTIAL/PLATELET
Basophils Absolute: 0.1 10*3/uL (ref 0.0–0.1)
Basophils Relative: 1 % (ref 0–1)
Eosinophils Absolute: 0.1 10*3/uL (ref 0.0–0.7)
Eosinophils Relative: 1 % (ref 0–5)
HCT: 32.2 % — ABNORMAL LOW (ref 36.0–46.0)
Hemoglobin: 10.2 g/dL — ABNORMAL LOW (ref 12.0–15.0)
LYMPHS PCT: 10 % — AB (ref 12–46)
Lymphs Abs: 0.7 10*3/uL (ref 0.7–4.0)
MCH: 27.3 pg (ref 26.0–34.0)
MCHC: 31.7 g/dL (ref 30.0–36.0)
MCV: 86.3 fL (ref 78.0–100.0)
MONO ABS: 1.1 10*3/uL — AB (ref 0.1–1.0)
MPV: 9.5 fL (ref 8.6–12.4)
Monocytes Relative: 16 % — ABNORMAL HIGH (ref 3–12)
NEUTROS PCT: 72 % (ref 43–77)
Neutro Abs: 4.8 10*3/uL (ref 1.7–7.7)
Platelets: 252 10*3/uL (ref 150–400)
RBC: 3.73 MIL/uL — AB (ref 3.87–5.11)
RDW: 19.1 % — AB (ref 11.5–15.5)
WBC: 6.7 10*3/uL (ref 4.0–10.5)

## 2014-05-30 LAB — URINALYSIS, MICROSCOPIC ONLY
Casts: NONE SEEN
Crystals: NONE SEEN
RBC / HPF: NONE SEEN RBC/hpf (ref <3)

## 2014-05-30 LAB — URINALYSIS, ROUTINE W REFLEX MICROSCOPIC
Bilirubin Urine: NEGATIVE
GLUCOSE, UA: NEGATIVE mg/dL
Ketones, ur: NEGATIVE mg/dL
Nitrite: NEGATIVE
Protein, ur: NEGATIVE mg/dL
Specific Gravity, Urine: 1.01 (ref 1.005–1.030)
Urobilinogen, UA: 0.2 mg/dL (ref 0.0–1.0)
pH: 5.5 (ref 5.0–8.0)

## 2014-05-30 NOTE — Addendum Note (Signed)
Addended by: Carnesha Maravilla, [place] on: 05/30/2014 10:52 AM   Modules accepted: Orders

## 2014-06-02 ENCOUNTER — Ambulatory Visit (INDEPENDENT_AMBULATORY_CARE_PROVIDER_SITE_OTHER): Payer: Commercial Managed Care - HMO | Admitting: Family Medicine

## 2014-06-02 ENCOUNTER — Encounter: Payer: Self-pay | Admitting: Family Medicine

## 2014-06-02 VITALS — BP 120/50 | HR 68 | Temp 98.2°F | Resp 18 | Ht 67.0 in | Wt 190.0 lb

## 2014-06-02 DIAGNOSIS — R531 Weakness: Secondary | ICD-10-CM | POA: Diagnosis not present

## 2014-06-02 DIAGNOSIS — M6281 Muscle weakness (generalized): Secondary | ICD-10-CM | POA: Diagnosis not present

## 2014-06-02 DIAGNOSIS — N184 Chronic kidney disease, stage 4 (severe): Secondary | ICD-10-CM | POA: Diagnosis not present

## 2014-06-02 DIAGNOSIS — D649 Anemia, unspecified: Secondary | ICD-10-CM | POA: Diagnosis not present

## 2014-06-02 DIAGNOSIS — I129 Hypertensive chronic kidney disease with stage 1 through stage 4 chronic kidney disease, or unspecified chronic kidney disease: Secondary | ICD-10-CM | POA: Diagnosis not present

## 2014-06-02 DIAGNOSIS — I4891 Unspecified atrial fibrillation: Secondary | ICD-10-CM | POA: Diagnosis not present

## 2014-06-02 DIAGNOSIS — I509 Heart failure, unspecified: Secondary | ICD-10-CM | POA: Diagnosis not present

## 2014-06-02 DIAGNOSIS — E119 Type 2 diabetes mellitus without complications: Secondary | ICD-10-CM | POA: Diagnosis not present

## 2014-06-02 DIAGNOSIS — F339 Major depressive disorder, recurrent, unspecified: Secondary | ICD-10-CM | POA: Diagnosis not present

## 2014-06-02 DIAGNOSIS — I10 Essential (primary) hypertension: Secondary | ICD-10-CM | POA: Diagnosis not present

## 2014-06-02 LAB — URINE CULTURE

## 2014-06-02 MED ORDER — TORSEMIDE 20 MG PO TABS: 20.0000 mg | ORAL_TABLET | Freq: Two times a day (BID) | ORAL | Status: DC

## 2014-06-02 NOTE — Progress Notes (Signed)
Subjective:    Patient ID: Claudia [name], female    DOB: 1931/10/14, 81 y.o.   MRN: [id number]  HPI  05/27/14 Please see my last office visit. We obtained the patient's renal function and her creatinine had risen steadily. Patient also appeared slightly dehydrated. Therefore we asked the patient to hold her torsemide and weight herself every day and we were planning to resume the torsemide if she gains more than 2 pounds. Patient weighed 179 pounds in my office at that office visit. Today she is 194 pounds. According to her son she gained this overnight. There have been no sudden changes in her diet. She denies eating more salt. She does report some mild shortness of breath. She reports weakness. She reports dizziness upon standing. Her blood pressure today is low at 84/60. She also has fallen and has a skin tear on her left elbow. It is approximately 8 cm x 4 cm. It is very superficial and it overlies the olecranon process.  At that time, my plan was: Patient requires diuresis. I recommended they take 40 mg of torsemide right now in the office. I then want him to resume torsemide 20 mg by mouth twice a day and I will recheck her in 48 hours. She is to go to the hospital immediately if her symptoms worsen. I'm concerned about hypotension given aggressive diuresis and therefore I recommended that he decrease the metoprolol to one half a tablet which is 12.5 mg by mouth twice a day. Recheck Thursday and reassess or immediately if worse.  05/29/14 Patient's weight is down to 188 pounds. This is 6 pounds better than it was 2 days ago. Her oxygen saturation is 99% on room air. Her blood pressure is also better at 110/80. Unfortunately the patient continues to feel weak. She appears very lethargic. Son states that she is intermittently confused at home. The patient's symptoms began suddenly on Sunday and Monday. It does make me worry that there is an underlying precipitating cause beyond just the fluid  overload and edema. Clinically the patient's edema is better however patient does not appear to be improving regarding her weakness and deconditioning. She does report dysuria on urination. That coupled with her intermittent delirium makes me concerned about possible urinary tract infection. Patient is also complaining of pain in her left shoulder from where she fell the other day. She is tender to palpation over the distal acromion.  AT that time, my plan was:  Patient is unable to void today in clinic. I will give her the necessary equipment to collect a urine sample at home. I am concerned the patient has an underlying urinary tract infection possibly precipitating the sudden weakness, confusion, and deconditioning. Given her age and frailty I will empirically begin her on Cipro 250 mg by mouth twice a day until I have the requisite urine sample. I will also send for culture. Meanwhile I will obtain an x-ray of the chest as well as the shoulder. The x-ray of the shoulder is to rule out a fracture. As read the chest is to rule out an underlying pneumonia as opposed to pulmonary edema which I believe is what I am auscultating in the lungs. Continue torsemide 20 mg by mouth twice a day until the patient's weight is under 179 pounds. I will also check a CBC and CMP today to monitor the patient's electrolytes, renal function, and to evaluate for leukocytosis. Recheck Monday or immediately if worse patient has my pager number to call  me over the weekend in case questions arise  06/02/14 Urine CX shows Gram negative rods.  CXR showed no PNA or acute CHF.  Renal fcn was unfortunately worse 2->2.5.  Patient's weight is stable at 190. It is actually slightly higher than it was last week. However based on clinical exam she has less edema in her legs. She also has no basilar crackles any further in her lungs. I believe the patient may be getting you euvolemic weight. Furthermore I hesitate to diuresis or any further  based on her rising kidney function. The patient feels approximate 50% better ever since starting the antibiotics. She is stronger. She is able to stand now without help although she still needs to be transported in a wheelchair. Overall she feels like things are improving Past Medical History  Diagnosis Date  . GERD (gastroesophageal reflux disease)   . PUD (peptic ulcer disease)   . Anemia   . Hypertension   . S/P endoscopy Aug 2011    3 superficial gastric ulcers, NSAID-induced  . S/P colonoscopy Sept 2011    left-sided diverticula, tubular adenoma  . Coronary artery disease   . Shortness of breath   . Diabetes mellitus     insulin dependent  . Headache(784.0)     rare migraines  . Cancer     hx of skin cancer  . Arthritis   . Osteopenia   . Hypercholesterolemia   . SIADH (syndrome of inappropriate ADH production)   . CHF (congestive heart failure)   . Myocardial infarction 2013  . Chronic renal failure, stage 3 (moderate)    Past Surgical History  Procedure Laterality Date  . Tonsillectomy    . Appendectomy    . Eye surgery      cataracts  . Cardiac stents  07/19/2011  . Esophagogastroduodenoscopy  10/16/09    normal without barrett's/three superficial gastric ulcers  . Colonoscopy  12/04/09    normal rectum/left-sided diverticula  . Joint replacement  arthroscopy to knee  . Toe amputation Left 2013    left second toe amputation  . Cardiac catheterization  01/22/2013  . Coronary angioplasty  07/2011  . Coronary artery bypass graft N/A 11/08/2013    Procedure: CORONARY ARTERY BYPASS GRAFTING (CABG), on pump, times two, using left internal mammary artery, cryo saphenous vein.;  Surgeon: Ivin Poot, MD;  Location: [place];  Service: Open Heart Surgery;  Laterality: N/A;  LIMA-LAD CRYOVEIN -OM  . Intraoperative transesophageal echocardiogram N/A 11/08/2013    Procedure: INTRAOPERATIVE TRANSESOPHAGEAL ECHOCARDIOGRAM;  Surgeon: Ivin Poot, MD;  Location: [place];  Service:  Open Heart Surgery;  Laterality: N/A;  . Mitral valve replacement N/A 11/08/2013    Procedure: MITRAL VALVE (MV) REPLACEMENT;  Surgeon: Ivin Poot, MD;  Location: [place];  Service: Open Heart Surgery;  Laterality: N/A;  #25 MAGNA MITRAL EASE  . Left heart catheterization with coronary angiogram N/A 07/19/2011    Procedure: LEFT HEART CATHETERIZATION WITH CORONARY ANGIOGRAM;  Surgeon: Laverda Page, MD;  Location: [facility] CATH LAB;  Service: Cardiovascular;  Laterality: N/A;  . Left heart catheterization with coronary angiogram N/A 12/21/2011    Procedure: LEFT HEART CATHETERIZATION WITH CORONARY ANGIOGRAM;  Surgeon: Laverda Page, MD;  Location: [facility] CATH LAB;  Service: Cardiovascular;  Laterality: N/A;  . Left heart catheterization with coronary angiogram N/A 02/20/2012    Procedure: LEFT HEART CATHETERIZATION WITH CORONARY ANGIOGRAM;  Surgeon: Laverda Page, MD;  Location: [facility] CATH LAB;  Service: Cardiovascular;  Laterality: N/A;  .  Left heart catheterization with coronary angiogram N/A 09/03/2012    Procedure: LEFT HEART CATHETERIZATION WITH CORONARY ANGIOGRAM;  Surgeon: Laverda Page, MD;  Location: [facility] CATH LAB;  Service: Cardiovascular;  Laterality: N/A;  . Percutaneous coronary stent intervention (pci-s)  09/03/2012    Procedure: PERCUTANEOUS CORONARY STENT INTERVENTION (PCI-S);  Surgeon: Laverda Page, MD;  Location: [facility] CATH LAB;  Service: Cardiovascular;;  . Left heart catheterization with coronary angiogram N/A 12/04/2012    Procedure: LEFT HEART CATHETERIZATION WITH CORONARY ANGIOGRAM;  Surgeon: Laverda Page, MD;  Location: [facility] CATH LAB;  Service: Cardiovascular;  Laterality: N/A;  . Left heart catheterization with coronary angiogram N/A 01/22/2013    Procedure: LEFT HEART CATHETERIZATION WITH CORONARY ANGIOGRAM;  Surgeon: Laverda Page, MD;  Location: [facility] CATH LAB;  Service: Cardiovascular;  Laterality: N/A;  . Left heart catheterization with coronary angiogram N/A  06/26/2013    Procedure: LEFT HEART CATHETERIZATION WITH CORONARY ANGIOGRAM;  Surgeon: Laverda Page, MD;  Location: [facility] CATH LAB;  Service: Cardiovascular;  Laterality: N/A;  . Left heart catheterization with coronary angiogram N/A 11/05/2013    Procedure: LEFT HEART CATHETERIZATION WITH CORONARY ANGIOGRAM;  Surgeon: Laverda Page, MD;  Location: [facility] CATH LAB;  Service: Cardiovascular;  Laterality: N/A;   Current Outpatient Prescriptions on File Prior to Visit  Medication Sig Dispense Refill  . acetaminophen (TYLENOL) 500 MG tablet Take 1,000 mg by mouth every 6 (six) hours as needed.    Marland Kitchen ascorbic acid (VITAMIN C) 500 MG tablet Take 500 mg by mouth 2 (two) times daily.    Marland Kitchen aspirin 81 MG chewable tablet Chew 2 tablets (162 mg total) by mouth daily.    . cetirizine (ZYRTEC) 10 MG tablet Take 10 mg by mouth at bedtime.    . ciprofloxacin (CIPRO) 250 MG tablet Take 1 tablet (250 mg total) by mouth 2 (two) times daily. 14 tablet 0  . feeding supplement, GLUCERNA SHAKE, (GLUCERNA SHAKE) LIQD Take 237 mLs by mouth 3 (three) times daily with meals.  0  . ferrous sulfate 325 (65 FE) MG tablet Take 325 mg by mouth 2 (two) times daily with a meal.    . fluticasone (FLONASE) 50 MCG/ACT nasal spray Place 1 spray into both nostrils daily. 16 g 2  . insulin aspart (NOVOLOG FLEXPEN) 100 UNIT/ML FlexPen Inject 5-10 Units into the skin 3 (three) times daily with meals. 70-120=0    251-300=5 121-150=1  301-350=7 151-200=2  351-400=9 201-250=3  400 or >, call MD  units of insulin (Patient taking differently: Inject 12 Units into the skin 2 (two) times daily with a meal. ) 15 mL 11  . insulin glargine (LANTUS) 100 UNIT/ML injection Inject 0.2 mLs (20 Units total) into the skin 2 (two) times daily. (Patient taking differently: Inject 12 Units into the skin 2 (two) times daily. ) 10 mL 11  . ipratropium-albuterol (DUONEB) 0.5-2.5 (3) MG/3ML SOLN Take 3 mLs by nebulization every 6 (six) hours as needed.    .  Melatonin 3 MG TABS Take 3 mg by mouth at bedtime as needed (insomnia).    . metoprolol tartrate (LOPRESSOR) 25 MG tablet Take 1 tablet (25 mg total) by mouth 2 (two) times daily. 60 tablet 3  . nitroGLYCERIN (NITROSTAT) 0.4 MG SL tablet Place 0.4 mg under the tongue every 5 (five) minutes as needed for chest pain.    Marland Kitchen ondansetron (ZOFRAN) 4 MG tablet Take 4 mg by mouth every 6 (six) hours as needed for nausea or  vomiting.    . pantoprazole (PROTONIX) 40 MG tablet Take 40 mg by mouth daily.    . polyethylene glycol (MIRALAX / GLYCOLAX) packet Take 17 g by mouth every other day.    . sennosides-docusate sodium (SENOKOT-S) 8.6-50 MG tablet Take 1 tablet by mouth 2 (two) times daily.    . simvastatin (ZOCOR) 20 MG tablet Take 20 mg by mouth daily.    . sodium chloride 1 G tablet Take 1 g by mouth 2 (two) times daily.    Marland Kitchen torsemide (DEMADEX) 20 MG tablet Take 20 mg by mouth daily.    . traZODone (DESYREL) 50 MG tablet Take 1 tablet (50 mg total) by mouth at bedtime. 30 tablet 2  . UNABLE TO FIND 1 each by Does not apply route once. [place] BED 1 each o  . UNABLE TO FIND 1 each by Does not apply route once. Deliver ONE BEDSIDE COMMODE 1 each 0  . UNABLE TO FIND 1 each by Does not apply route once. Deliver ONE ROLLING WALKER WITH SEAT 1 each 0   No current facility-administered medications on file prior to visit.   Allergies  Allergen Reactions  . Aspirin Other (See Comments)    High doses caused stomach bleeds   History   Social History  . Marital Status: Widowed    Spouse Name: N/A  . Number of Children: N/A  . Years of Education: N/A   Occupational History  . Not on file.   Social History Main Topics  . Smoking status: Never Smoker   . Smokeless tobacco: Never Used  . Alcohol Use: No  . Drug Use: No  . Sexual Activity: No   Other Topics Concern  . Not on file   Social History Narrative     Review of Systems  All other systems reviewed and are  negative.      Objective:   Physical Exam  Neck: No JVD present.  Cardiovascular: Normal rate, regular rhythm and normal heart sounds.   Pulmonary/Chest: Effort normal and breath sounds normal. She has no rales.  Abdominal: Soft. Bowel sounds are normal. She exhibits no distension and no mass. There is no tenderness. There is no rebound and no guarding.  Musculoskeletal: She exhibits edema.  Vitals reviewed.     Assessment & Plan:  Chronic kidney disease (CKD), stage IV (severe) - Plan: COMPLETE METABOLIC PANEL WITH GFR, Brain natriuretic peptide  CHF (congestive heart failure), NYHA class III, unspecified failure chronicity, unspecified type  Weakness  Patient's weakness is improving. I believe her weakness and deconditioning and delirium was due to her urinary tract infection. She seems to be improving on antibiotics. I will recheck a urinalysis on Friday when I see the patient back. Please continue the antibiotics and complete the full course. At the present time I believe the patient may actually be euvolemic. Therefore I will continue the torsemide 20 mg by mouth twice a day. I will not try to drop her weight further. If her renal function continues to rise, we may need to decrease the torsemide to once a day. Recheck on Friday or sooner if worse .

## 2014-06-03 ENCOUNTER — Other Ambulatory Visit: Payer: Self-pay | Admitting: Family Medicine

## 2014-06-03 ENCOUNTER — Telehealth: Payer: Self-pay | Admitting: *Deleted

## 2014-06-03 DIAGNOSIS — F339 Major depressive disorder, recurrent, unspecified: Secondary | ICD-10-CM | POA: Diagnosis not present

## 2014-06-03 DIAGNOSIS — N184 Chronic kidney disease, stage 4 (severe): Secondary | ICD-10-CM | POA: Diagnosis not present

## 2014-06-03 DIAGNOSIS — E119 Type 2 diabetes mellitus without complications: Secondary | ICD-10-CM | POA: Diagnosis not present

## 2014-06-03 DIAGNOSIS — I129 Hypertensive chronic kidney disease with stage 1 through stage 4 chronic kidney disease, or unspecified chronic kidney disease: Secondary | ICD-10-CM | POA: Diagnosis not present

## 2014-06-03 DIAGNOSIS — D649 Anemia, unspecified: Secondary | ICD-10-CM | POA: Diagnosis not present

## 2014-06-03 DIAGNOSIS — R945 Abnormal results of liver function studies: Principal | ICD-10-CM

## 2014-06-03 DIAGNOSIS — M6281 Muscle weakness (generalized): Secondary | ICD-10-CM | POA: Diagnosis not present

## 2014-06-03 DIAGNOSIS — I4891 Unspecified atrial fibrillation: Secondary | ICD-10-CM | POA: Diagnosis not present

## 2014-06-03 DIAGNOSIS — R7989 Other specified abnormal findings of blood chemistry: Secondary | ICD-10-CM

## 2014-06-03 DIAGNOSIS — I10 Essential (primary) hypertension: Secondary | ICD-10-CM | POA: Diagnosis not present

## 2014-06-03 DIAGNOSIS — I509 Heart failure, unspecified: Secondary | ICD-10-CM | POA: Diagnosis not present

## 2014-06-03 LAB — COMPLETE METABOLIC PANEL WITH GFR
ALT: 36 U/L — ABNORMAL HIGH (ref 0–35)
AST: 48 U/L — AB (ref 0–37)
Albumin: 3.3 g/dL — ABNORMAL LOW (ref 3.5–5.2)
Alkaline Phosphatase: 422 U/L — ABNORMAL HIGH (ref 39–117)
BUN: 72 mg/dL — AB (ref 6–23)
CO2: 21 mEq/L (ref 19–32)
Calcium: 8.7 mg/dL (ref 8.4–10.5)
Chloride: 98 mEq/L (ref 96–112)
Creat: 2.12 mg/dL — ABNORMAL HIGH (ref 0.50–1.10)
GFR, Est African American: 25 mL/min — ABNORMAL LOW
GFR, Est Non African American: 22 mL/min — ABNORMAL LOW
Glucose, Bld: 112 mg/dL — ABNORMAL HIGH (ref 70–99)
Potassium: 5 mEq/L (ref 3.5–5.3)
SODIUM: 133 meq/L — AB (ref 135–145)
TOTAL PROTEIN: 6.9 g/dL (ref 6.0–8.3)
Total Bilirubin: 0.7 mg/dL (ref 0.2–1.2)

## 2014-06-03 LAB — BRAIN NATRIURETIC PEPTIDE: BRAIN NATRIURETIC PEPTIDE: 717.5 pg/mL — AB (ref 0.0–100.0)

## 2014-06-03 NOTE — Telephone Encounter (Signed)
Submitted [employer] referral thru acuity connect for authorization to [employer] with authorization number [id number] for [name] of abdomen  Requesting provider: Cletus Gash T. Pickard,MD  Treating provider: [facility]  Place of service: [place]  Number of visits:1  Start Date:06/03/14  End Date: 11/30/14  Dx: R79.89-Other specified abnormal findings of blood chemistry  Procedure: 61470-[id number] exam of abdomen  Type of service:USND

## 2014-06-04 ENCOUNTER — Other Ambulatory Visit: Payer: Self-pay | Admitting: Family Medicine

## 2014-06-04 DIAGNOSIS — M6281 Muscle weakness (generalized): Secondary | ICD-10-CM | POA: Diagnosis not present

## 2014-06-04 DIAGNOSIS — I4891 Unspecified atrial fibrillation: Secondary | ICD-10-CM | POA: Diagnosis not present

## 2014-06-04 DIAGNOSIS — I129 Hypertensive chronic kidney disease with stage 1 through stage 4 chronic kidney disease, or unspecified chronic kidney disease: Secondary | ICD-10-CM | POA: Diagnosis not present

## 2014-06-04 DIAGNOSIS — N184 Chronic kidney disease, stage 4 (severe): Secondary | ICD-10-CM | POA: Diagnosis not present

## 2014-06-04 DIAGNOSIS — I10 Essential (primary) hypertension: Secondary | ICD-10-CM | POA: Diagnosis not present

## 2014-06-04 DIAGNOSIS — D649 Anemia, unspecified: Secondary | ICD-10-CM | POA: Diagnosis not present

## 2014-06-04 DIAGNOSIS — F339 Major depressive disorder, recurrent, unspecified: Secondary | ICD-10-CM | POA: Diagnosis not present

## 2014-06-04 DIAGNOSIS — E119 Type 2 diabetes mellitus without complications: Secondary | ICD-10-CM | POA: Diagnosis not present

## 2014-06-04 DIAGNOSIS — I509 Heart failure, unspecified: Secondary | ICD-10-CM | POA: Diagnosis not present

## 2014-06-04 MED ORDER — INSULIN ASPART 100 UNIT/ML FLEXPEN: PEN_INJECTOR | SUBCUTANEOUS | Status: DC

## 2014-06-04 MED ORDER — IPRATROPIUM-ALBUTEROL 0.5-2.5 (3) MG/3ML IN SOLN: 3.0000 mL | Freq: Four times a day (QID) | RESPIRATORY_TRACT | Status: DC | PRN

## 2014-06-04 MED ORDER — ASPIRIN 81 MG PO CHEW: 162.0000 mg | CHEWABLE_TABLET | Freq: Every day | ORAL | Status: DC

## 2014-06-04 MED ORDER — METOPROLOL TARTRATE 25 MG PO TABS: 25.0000 mg | ORAL_TABLET | Freq: Two times a day (BID) | ORAL | Status: DC

## 2014-06-04 MED ORDER — FLUTICASONE PROPIONATE 50 MCG/ACT NA SUSP: 1.0000 | Freq: Every day | NASAL | Status: DC

## 2014-06-04 MED ORDER — INSULIN GLARGINE 100 UNIT/ML ~~LOC~~ SOLN: 12.0000 [IU] | Freq: Two times a day (BID) | SUBCUTANEOUS | Status: DC

## 2014-06-04 MED ORDER — FERROUS SULFATE 325 (65 FE) MG PO TABS: 325.0000 mg | ORAL_TABLET | Freq: Two times a day (BID) | ORAL | Status: DC

## 2014-06-04 MED ORDER — ASCORBIC ACID 500 MG PO TABS: 500.0000 mg | ORAL_TABLET | Freq: Two times a day (BID) | ORAL | Status: DC

## 2014-06-04 MED ORDER — ACETAMINOPHEN 500 MG PO TABS: 1000.0000 mg | ORAL_TABLET | Freq: Four times a day (QID) | ORAL | Status: DC | PRN

## 2014-06-04 MED ORDER — GLUCOSE BLOOD VI STRP: ORAL_STRIP | Status: DC

## 2014-06-04 MED ORDER — CETIRIZINE HCL 10 MG PO TABS: 10.0000 mg | ORAL_TABLET | Freq: Every day | ORAL | Status: AC

## 2014-06-04 MED ORDER — SIMVASTATIN 20 MG PO TABS: 20.0000 mg | ORAL_TABLET | Freq: Every day | ORAL | Status: DC

## 2014-06-04 MED ORDER — TRAZODONE HCL 50 MG PO TABS: 50.0000 mg | ORAL_TABLET | Freq: Every day | ORAL | Status: DC

## 2014-06-04 MED ORDER — POLYETHYLENE GLYCOL 3350 17 G PO PACK: 17.0000 g | PACK | ORAL | Status: DC

## 2014-06-04 MED ORDER — SENNA-DOCUSATE SODIUM 8.6-50 MG PO TABS: 1.0000 | ORAL_TABLET | Freq: Two times a day (BID) | ORAL | Status: DC

## 2014-06-04 MED ORDER — NITROGLYCERIN 0.4 MG SL SUBL: 0.4000 mg | SUBLINGUAL_TABLET | SUBLINGUAL | Status: DC | PRN

## 2014-06-04 MED ORDER — ACCU-CHEK FASTCLIX LANCETS MISC: Status: DC

## 2014-06-04 MED ORDER — ONDANSETRON HCL 4 MG PO TABS: 4.0000 mg | ORAL_TABLET | Freq: Four times a day (QID) | ORAL | Status: DC | PRN

## 2014-06-04 MED ORDER — TORSEMIDE 20 MG PO TABS: 20.0000 mg | ORAL_TABLET | Freq: Two times a day (BID) | ORAL | Status: DC

## 2014-06-04 MED ORDER — PANTOPRAZOLE SODIUM 40 MG PO TBEC: 40.0000 mg | DELAYED_RELEASE_TABLET | Freq: Every day | ORAL | Status: DC

## 2014-06-04 NOTE — Telephone Encounter (Signed)
Updated med list for pt was sent to [facility] per their request.

## 2014-06-06 ENCOUNTER — Ambulatory Visit (HOSPITAL_COMMUNITY)
Admission: RE | Admit: 2014-06-06 | Discharge: 2014-06-06 | Disposition: A | Discharge status: Home / Self Care | Payer: Commercial Managed Care - HMO | Source: Ambulatory Visit | Attending: Family Medicine | Admitting: Family Medicine

## 2014-06-06 ENCOUNTER — Ambulatory Visit (INDEPENDENT_AMBULATORY_CARE_PROVIDER_SITE_OTHER): Payer: Commercial Managed Care - HMO | Admitting: Family Medicine

## 2014-06-06 ENCOUNTER — Encounter: Payer: Self-pay | Admitting: Family Medicine

## 2014-06-06 ENCOUNTER — Telehealth: Payer: Self-pay | Admitting: Family Medicine

## 2014-06-06 VITALS — BP 104/52 | HR 78 | Temp 98.0°F | Resp 16 | Ht 67.0 in | Wt 189.0 lb

## 2014-06-06 DIAGNOSIS — R7989 Other specified abnormal findings of blood chemistry: Secondary | ICD-10-CM

## 2014-06-06 DIAGNOSIS — M25519 Pain in unspecified shoulder: Secondary | ICD-10-CM | POA: Diagnosis not present

## 2014-06-06 DIAGNOSIS — N3000 Acute cystitis without hematuria: Secondary | ICD-10-CM | POA: Diagnosis not present

## 2014-06-06 DIAGNOSIS — I509 Heart failure, unspecified: Secondary | ICD-10-CM

## 2014-06-06 DIAGNOSIS — Z9049 Acquired absence of other specified parts of digestive tract: Secondary | ICD-10-CM | POA: Insufficient documentation

## 2014-06-06 DIAGNOSIS — R531 Weakness: Secondary | ICD-10-CM

## 2014-06-06 DIAGNOSIS — R945 Abnormal results of liver function studies: Secondary | ICD-10-CM

## 2014-06-06 LAB — URINALYSIS, MICROSCOPIC ONLY
CASTS: NONE SEEN
CRYSTALS: NONE SEEN

## 2014-06-06 LAB — URINALYSIS, ROUTINE W REFLEX MICROSCOPIC
BILIRUBIN URINE: NEGATIVE
Glucose, UA: NEGATIVE mg/dL
KETONES UR: NEGATIVE mg/dL
Nitrite: POSITIVE — AB
Protein, ur: NEGATIVE mg/dL
Specific Gravity, Urine: 1.015 (ref 1.005–1.030)
Urobilinogen, UA: 0.2 mg/dL (ref 0.0–1.0)
pH: 5.5 (ref 5.0–8.0)

## 2014-06-06 MED ORDER — CIPROFLOXACIN HCL 250 MG PO TABS
250.0000 mg | ORAL_TABLET | Freq: Two times a day (BID) | ORAL | Status: DC
Start: 2014-06-06 — End: 2014-06-17

## 2014-06-06 MED ORDER — BD PEN NEEDLE SHORT U/F 31G X 8 MM MISC
5.0000 | Freq: Every day | Status: DC
Start: 2014-06-06 — End: 2015-04-16

## 2014-06-06 MED ORDER — INSULIN GLARGINE 100 UNIT/ML SOLOSTAR PEN: 12.0000 [IU] | PEN_INJECTOR | Freq: Two times a day (BID) | SUBCUTANEOUS | Status: DC

## 2014-06-06 MED ORDER — ACCU-CHEK AVIVA DEVI: Status: AC

## 2014-06-06 MED ORDER — OXYCODONE HCL 5 MG PO TABS: 2.5000 mg | ORAL_TABLET | ORAL | Status: DC | PRN

## 2014-06-06 NOTE — Telephone Encounter (Signed)
Pharmacy called to change Lantus from vials to pens.  Also given order for pen needles.  Pt needed new glucometer.  All approved

## 2014-06-06 NOTE — Addendum Note (Signed)
Addended by: Shary Decamp B on: 06/06/2014 04:05 PM   Modules accepted: Orders

## 2014-06-06 NOTE — Progress Notes (Signed)
Subjective:    Patient ID: Claudia [name], female    DOB: 05-10-1931, 81 y.o.   MRN: [id number]  HPI  05/27/14 Please see my last office visit. We obtained the patient's renal function and her creatinine had risen steadily. Patient also appeared slightly dehydrated. Therefore we asked the patient to hold her torsemide and weight herself every day and we were planning to resume the torsemide if she gains more than 2 pounds. Patient weighed 179 pounds in my office at that office visit. Today she is 194 pounds. According to her son she gained this overnight. There have been no sudden changes in her diet. She denies eating more salt. She does report some mild shortness of breath. She reports weakness. She reports dizziness upon standing. Her blood pressure today is low at 84/60. She also has fallen and has a skin tear on her left elbow. It is approximately 8 cm x 4 cm. It is very superficial and it overlies the olecranon process.  At that time, my plan was: Patient requires diuresis. I recommended they take 40 mg of torsemide right now in the office. I then want him to resume torsemide 20 mg by mouth twice a day and I will recheck her in 48 hours. She is to go to the hospital immediately if her symptoms worsen. I'm concerned about hypotension given aggressive diuresis and therefore I recommended that he decrease the metoprolol to one half a tablet which is 12.5 mg by mouth twice a day. Recheck Thursday and reassess or immediately if worse.  05/29/14 Patient's weight is down to 188 pounds. This is 6 pounds better than it was 2 days ago. Her oxygen saturation is 99% on room air. Her blood pressure is also better at 110/80. Unfortunately the patient continues to feel weak. She appears very lethargic. Son states that she is intermittently confused at home. The patient's symptoms began suddenly on Sunday and Monday. It does make me worry that there is an underlying precipitating cause beyond just the fluid  overload and edema. Clinically the patient's edema is better however patient does not appear to be improving regarding her weakness and deconditioning. She does report dysuria on urination. That coupled with her intermittent delirium makes me concerned about possible urinary tract infection. Patient is also complaining of pain in her left shoulder from where she fell the other day. She is tender to palpation over the distal acromion.  AT that time, my plan was:  Patient is unable to void today in clinic. I will give her the necessary equipment to collect a urine sample at home. I am concerned the patient has an underlying urinary tract infection possibly precipitating the sudden weakness, confusion, and deconditioning. Given her age and frailty I will empirically begin her on Cipro 250 mg by mouth twice a day until I have the requisite urine sample. I will also send for culture. Meanwhile I will obtain an x-ray of the chest as well as the shoulder. The x-ray of the shoulder is to rule out a fracture. As read the chest is to rule out an underlying pneumonia as opposed to pulmonary edema which I believe is what I am auscultating in the lungs. Continue torsemide 20 mg by mouth twice a day until the patient's weight is under 179 pounds. I will also check a CBC and CMP today to monitor the patient's electrolytes, renal function, and to evaluate for leukocytosis. Recheck Monday or immediately if worse patient has my pager number to call  me over the weekend in case questions arise  06/02/14 Urine CX shows Gram negative rods.  CXR showed no PNA or acute CHF.  Renal fcn was unfortunately worse 2->2.5.  Patient's weight is stable at 190. It is actually slightly higher than it was last week. However based on clinical exam she has less edema in her legs. She also has no basilar crackles any further in her lungs. I believe the patient may be getting you euvolemic weight. Furthermore I hesitate to diuresis or any further  based on her rising kidney function. The patient feels approximate 50% better ever since starting the antibiotics. She is stronger. She is able to stand now without help although she still needs to be transported in a wheelchair. Overall she feels like things are improving.  At that time, my plan was: Patient's weakness is improving. I believe her weakness and deconditioning and delirium was due to her urinary tract infection. She seems to be improving on antibiotics. I will recheck a urinalysis on Friday when I see the patient back. Please continue the antibiotics and complete the full course. At the present time I believe the patient may actually be euvolemic. Therefore I will continue the torsemide 20 mg by mouth twice a day. I will not try to drop her weight further. If her renal function continues to rise, we may need to decrease the torsemide to once a day. Recheck on Friday or sooner if worse.  06/06/14 Patient is here today for recheck. Her breathing is stable. Her weight is stable. The edema in her legs is stable. Her most recent renal function had actually improved and was stable. Therefore I feel that we have found a stable dose of her diuretic. Her congestive heart failure seems well managed. Her weakness is gradually improving. Unfortunately the patient continues to fall occasionally. She seems to lose her balance more easily. Her mental status has improved. The patient seems brighter and more alert. She is quickly answering all questions appropriately. Urinary tract infection seems to have improved dramatically. On repeat urinalysis however she continues to have moderate leukocyte esterase and nitrites as well as white blood cells. Therefore it seems as though her urinary tract infection persist. Unfortunately the patient's liver function tests have steadily risen. Her alkaline phosphatase over 300. Now AST and her ALT are starting to climb. Patient had a right upper quadrant ultrasound performed  this morning however the results are not back yet Past Medical History  Diagnosis Date  . GERD (gastroesophageal reflux disease)   . PUD (peptic ulcer disease)   . Anemia   . Hypertension   . S/P endoscopy Aug 2011    3 superficial gastric ulcers, NSAID-induced  . S/P colonoscopy Sept 2011    left-sided diverticula, tubular adenoma  . Coronary artery disease   . Shortness of breath   . Diabetes mellitus     insulin dependent  . Headache(784.0)     rare migraines  . Cancer     hx of skin cancer  . Arthritis   . Osteopenia   . Hypercholesterolemia   . SIADH (syndrome of inappropriate ADH production)   . CHF (congestive heart failure)   . Myocardial infarction 2013  . Chronic renal failure, stage 3 (moderate)    Past Surgical History  Procedure Laterality Date  . Tonsillectomy    . Appendectomy    . Eye surgery      cataracts  . Cardiac stents  07/19/2011  . Esophagogastroduodenoscopy  10/16/09  normal without barrett's/three superficial gastric ulcers  . Colonoscopy  12/04/09    normal rectum/left-sided diverticula  . Joint replacement  arthroscopy to knee  . Toe amputation Left 2013    left second toe amputation  . Cardiac catheterization  01/22/2013  . Coronary angioplasty  07/2011  . Coronary artery bypass graft N/A 11/08/2013    Procedure: CORONARY ARTERY BYPASS GRAFTING (CABG), on pump, times two, using left internal mammary artery, cryo saphenous vein.;  Surgeon: Ivin Poot, MD;  Location: [place];  Service: Open Heart Surgery;  Laterality: N/A;  LIMA-LAD CRYOVEIN -OM  . Intraoperative transesophageal echocardiogram N/A 11/08/2013    Procedure: INTRAOPERATIVE TRANSESOPHAGEAL ECHOCARDIOGRAM;  Surgeon: Ivin Poot, MD;  Location: [place];  Service: Open Heart Surgery;  Laterality: N/A;  . Mitral valve replacement N/A 11/08/2013    Procedure: MITRAL VALVE (MV) REPLACEMENT;  Surgeon: Ivin Poot, MD;  Location: [place];  Service: Open Heart Surgery;  Laterality: N/A;   #25 MAGNA MITRAL EASE  . Left heart catheterization with coronary angiogram N/A 07/19/2011    Procedure: LEFT HEART CATHETERIZATION WITH CORONARY ANGIOGRAM;  Surgeon: Laverda Page, MD;  Location: [facility] CATH LAB;  Service: Cardiovascular;  Laterality: N/A;  . Left heart catheterization with coronary angiogram N/A 12/21/2011    Procedure: LEFT HEART CATHETERIZATION WITH CORONARY ANGIOGRAM;  Surgeon: Laverda Page, MD;  Location: [facility] CATH LAB;  Service: Cardiovascular;  Laterality: N/A;  . Left heart catheterization with coronary angiogram N/A 02/20/2012    Procedure: LEFT HEART CATHETERIZATION WITH CORONARY ANGIOGRAM;  Surgeon: Laverda Page, MD;  Location: [facility] CATH LAB;  Service: Cardiovascular;  Laterality: N/A;  . Left heart catheterization with coronary angiogram N/A 09/03/2012    Procedure: LEFT HEART CATHETERIZATION WITH CORONARY ANGIOGRAM;  Surgeon: Laverda Page, MD;  Location: [facility] CATH LAB;  Service: Cardiovascular;  Laterality: N/A;  . Percutaneous coronary stent intervention (pci-s)  09/03/2012    Procedure: PERCUTANEOUS CORONARY STENT INTERVENTION (PCI-S);  Surgeon: Laverda Page, MD;  Location: [facility] CATH LAB;  Service: Cardiovascular;;  . Left heart catheterization with coronary angiogram N/A 12/04/2012    Procedure: LEFT HEART CATHETERIZATION WITH CORONARY ANGIOGRAM;  Surgeon: Laverda Page, MD;  Location: [facility] CATH LAB;  Service: Cardiovascular;  Laterality: N/A;  . Left heart catheterization with coronary angiogram N/A 01/22/2013    Procedure: LEFT HEART CATHETERIZATION WITH CORONARY ANGIOGRAM;  Surgeon: Laverda Page, MD;  Location: [facility] CATH LAB;  Service: Cardiovascular;  Laterality: N/A;  . Left heart catheterization with coronary angiogram N/A 06/26/2013    Procedure: LEFT HEART CATHETERIZATION WITH CORONARY ANGIOGRAM;  Surgeon: Laverda Page, MD;  Location: [facility] CATH LAB;  Service: Cardiovascular;  Laterality: N/A;  . Left heart catheterization with coronary  angiogram N/A 11/05/2013    Procedure: LEFT HEART CATHETERIZATION WITH CORONARY ANGIOGRAM;  Surgeon: Laverda Page, MD;  Location: [facility] CATH LAB;  Service: Cardiovascular;  Laterality: N/A;   Current Outpatient Prescriptions on File Prior to Visit  Medication Sig Dispense Refill  . ACCU-CHEK FASTCLIX LANCETS MISC Check BS QID - Fasting, before meals and QHS 100 each 5  . acetaminophen (TYLENOL) 500 MG tablet Take 2 tablets (1,000 mg total) by mouth every 6 (six) hours as needed. 60 tablet 11  . ascorbic acid (VITAMIN C) 500 MG tablet Take 1 tablet (500 mg total) by mouth 2 (two) times daily. 60 tablet 11  . aspirin 81 MG chewable tablet Chew 2 tablets (162 mg total) by mouth daily. 60 tablet  11  . cetirizine (ZYRTEC) 10 MG tablet Take 1 tablet (10 mg total) by mouth at bedtime. 30 tablet 11  . ciprofloxacin (CIPRO) 250 MG tablet Take 1 tablet (250 mg total) by mouth 2 (two) times daily. 14 tablet 0  . feeding supplement, GLUCERNA SHAKE, (GLUCERNA SHAKE) LIQD Take 237 mLs by mouth 3 (three) times daily with meals.  0  . ferrous sulfate 325 (65 FE) MG tablet Take 1 tablet (325 mg total) by mouth 2 (two) times daily with a meal. 60 tablet 11  . fluticasone (FLONASE) 50 MCG/ACT nasal spray Place 1 spray into both nostrils daily. 16 g 5  . glucose blood (ACCU-CHEK SMARTVIEW) test strip Check BS QID - Dx: E11.9 100 each 12  . insulin aspart (NOVOLOG FLEXPEN) 100 UNIT/ML FlexPen 70-120=0    251-300=5 121-150=1  301-350=7 151-200=2  351-400=9 201-250=3  400 or >, call MD  units of insulin 15 mL 11  . ipratropium-albuterol (DUONEB) 0.5-2.5 (3) MG/3ML SOLN Take 3 mLs by nebulization every 6 (six) hours as needed. 360 mL 1  . Melatonin 3 MG TABS Take 3 mg by mouth at bedtime as needed (insomnia).    . metoprolol tartrate (LOPRESSOR) 25 MG tablet Take 1 tablet (25 mg total) by mouth 2 (two) times daily. 60 tablet 3  . nitroGLYCERIN (NITROSTAT) 0.4 MG SL tablet Place 1 tablet (0.4 mg total) under the  tongue every 5 (five) minutes as needed for chest pain. 30 tablet 0  . ondansetron (ZOFRAN) 4 MG tablet Take 1 tablet (4 mg total) by mouth every 6 (six) hours as needed for nausea or vomiting. 30 tablet 3  . pantoprazole (PROTONIX) 40 MG tablet Take 1 tablet (40 mg total) by mouth daily. 30 tablet 11  . polyethylene glycol (MIRALAX / GLYCOLAX) packet Take 17 g by mouth every other day. 24 each 11  . sennosides-docusate sodium (SENOKOT-S) 8.6-50 MG tablet Take 1 tablet by mouth 2 (two) times daily. 60 tablet 11  . simvastatin (ZOCOR) 20 MG tablet Take 1 tablet (20 mg total) by mouth daily. 30 tablet 5  . sodium chloride 1 G tablet Take 1 g by mouth 2 (two) times daily.    Marland Kitchen torsemide (DEMADEX) 20 MG tablet Take 1 tablet (20 mg total) by mouth 2 (two) times daily. 60 tablet 11  . traZODone (DESYREL) 50 MG tablet Take 1 tablet (50 mg total) by mouth at bedtime. 30 tablet 2  . UNABLE TO FIND 1 each by Does not apply route once. [place] BED 1 each o  . UNABLE TO FIND 1 each by Does not apply route once. [place]iver ONE BEDSIDE COMMODE 1 each 0  . UNABLE TO FIND 1 each by Does not apply route once. Deliver ONE ROLLING WALKER WITH SEAT 1 each 0   No current facility-administered medications on file prior to visit.   Allergies  Allergen Reactions  . Aspirin Other (See Comments)    High doses caused stomach bleeds   History   Social History  . Marital Status: Widowed    Spouse Name: N/A  . Number of Children: N/A  . Years of Education: N/A   Occupational History  . Not on file.   Social History Main Topics  . Smoking status: Never Smoker   . Smokeless tobacco: Never Used  . Alcohol Use: No  . Drug Use: No  . Sexual Activity: No   Other Topics Concern  . Not on file   Social History Narrative  Review of Systems  All other systems reviewed and are negative.      Objective:   Physical Exam  Neck: No JVD present.  Cardiovascular: Normal rate, regular rhythm and  normal heart sounds.   Pulmonary/Chest: Effort normal and breath sounds normal. She has no rales.  Abdominal: Soft. Bowel sounds are normal. She exhibits no distension and no mass. There is no tenderness. There is no rebound and no guarding.  Musculoskeletal: She exhibits edema.  Vitals reviewed.     Assessment & Plan:  Pain in joint, shoulder region, unspecified laterality - Plan: oxyCODONE (ROXICODONE) 5 MG immediate release tablet  Acute cystitis without hematuria - Plan: Urinalysis, Routine w reflex microscopic, ciprofloxacin (CIPRO) 250 MG tablet, CANCELED: Urinalysis, Routine w reflex microscopic  CHF (congestive heart failure), NYHA class III, unspecified failure chronicity, unspecified type  Weakness  Elevated LFTs  Given her LFTs, I will the patient to discontinue all Tylenol use. Patient is still having significant pain in her shoulders. Therefore I will give her oxycodone 5 mg tablets. Given her age and frailty I recommended that she try taking just one half a tablet every 6-8 hours as needed for pain and try to use them sparingly. I will await the results of the right upper quadrant ultrasound to determine the etiology of the rise in her liver function test. I am concerned about a possible blockage or mass in the liver. Her congestive heart failure seems stable. I will make no changes in the dose of her diuretic. Her renal function is stable. Her urinary tract infection clinically has improved but still persist on her urinalysis. Therefore I will extend her antibiotics for an additional week as cultures have proved that she does have Escherichia coli sensitive to Cipro. We will recheck the patient next week pending the results of the right upper quadrant ultrasound .

## 2014-06-09 ENCOUNTER — Telehealth: Payer: Self-pay | Admitting: Family Medicine

## 2014-06-09 DIAGNOSIS — N184 Chronic kidney disease, stage 4 (severe): Secondary | ICD-10-CM | POA: Diagnosis not present

## 2014-06-09 DIAGNOSIS — I2510 Atherosclerotic heart disease of native coronary artery without angina pectoris: Secondary | ICD-10-CM | POA: Diagnosis not present

## 2014-06-09 DIAGNOSIS — D649 Anemia, unspecified: Secondary | ICD-10-CM | POA: Diagnosis not present

## 2014-06-09 DIAGNOSIS — I129 Hypertensive chronic kidney disease with stage 1 through stage 4 chronic kidney disease, or unspecified chronic kidney disease: Secondary | ICD-10-CM | POA: Diagnosis not present

## 2014-06-09 DIAGNOSIS — E119 Type 2 diabetes mellitus without complications: Secondary | ICD-10-CM | POA: Diagnosis not present

## 2014-06-09 DIAGNOSIS — M6281 Muscle weakness (generalized): Secondary | ICD-10-CM | POA: Diagnosis not present

## 2014-06-09 DIAGNOSIS — R269 Unspecified abnormalities of gait and mobility: Secondary | ICD-10-CM | POA: Diagnosis not present

## 2014-06-09 DIAGNOSIS — I890 Lymphedema, not elsewhere classified: Secondary | ICD-10-CM | POA: Diagnosis not present

## 2014-06-09 DIAGNOSIS — I509 Heart failure, unspecified: Secondary | ICD-10-CM | POA: Diagnosis not present

## 2014-06-09 DIAGNOSIS — I10 Essential (primary) hypertension: Secondary | ICD-10-CM | POA: Diagnosis not present

## 2014-06-09 DIAGNOSIS — F339 Major depressive disorder, recurrent, unspecified: Secondary | ICD-10-CM | POA: Diagnosis not present

## 2014-06-09 DIAGNOSIS — I4891 Unspecified atrial fibrillation: Secondary | ICD-10-CM | POA: Diagnosis not present

## 2014-06-09 LAB — URINE CULTURE: Colony Count: >=100000

## 2014-06-09 MED ORDER — PANTOPRAZOLE SODIUM 40 MG PO TBEC: 40.0000 mg | DELAYED_RELEASE_TABLET | Freq: Every day | ORAL | Status: DC

## 2014-06-09 MED ORDER — SIMVASTATIN 20 MG PO TABS: 20.0000 mg | ORAL_TABLET | Freq: Every day | ORAL | Status: DC

## 2014-06-09 NOTE — Telephone Encounter (Signed)
Pt is switching pharmacies to mail order but will be out of Simvastatin and Protonix before mail order comes in - med sent to [facility]

## 2014-06-10 DIAGNOSIS — D649 Anemia, unspecified: Secondary | ICD-10-CM | POA: Diagnosis not present

## 2014-06-10 DIAGNOSIS — I4891 Unspecified atrial fibrillation: Secondary | ICD-10-CM | POA: Diagnosis not present

## 2014-06-10 DIAGNOSIS — I509 Heart failure, unspecified: Secondary | ICD-10-CM | POA: Diagnosis not present

## 2014-06-10 DIAGNOSIS — I10 Essential (primary) hypertension: Secondary | ICD-10-CM | POA: Diagnosis not present

## 2014-06-10 DIAGNOSIS — I129 Hypertensive chronic kidney disease with stage 1 through stage 4 chronic kidney disease, or unspecified chronic kidney disease: Secondary | ICD-10-CM | POA: Diagnosis not present

## 2014-06-10 DIAGNOSIS — M6281 Muscle weakness (generalized): Secondary | ICD-10-CM | POA: Diagnosis not present

## 2014-06-10 DIAGNOSIS — F339 Major depressive disorder, recurrent, unspecified: Secondary | ICD-10-CM | POA: Diagnosis not present

## 2014-06-10 DIAGNOSIS — E119 Type 2 diabetes mellitus without complications: Secondary | ICD-10-CM | POA: Diagnosis not present

## 2014-06-10 DIAGNOSIS — N184 Chronic kidney disease, stage 4 (severe): Secondary | ICD-10-CM | POA: Diagnosis not present

## 2014-06-11 ENCOUNTER — Telehealth: Payer: Self-pay | Admitting: Family Medicine

## 2014-06-11 MED ORDER — NITROFURANTOIN MONOHYD MACRO 100 MG PO CAPS
100.0000 mg | ORAL_CAPSULE | Freq: Two times a day (BID) | ORAL | Status: DC
Start: 2014-06-11 — End: 2014-06-20

## 2014-06-11 NOTE — Telephone Encounter (Signed)
Patient's son aware of results.

## 2014-06-11 NOTE — Telephone Encounter (Signed)
Patient calling to get results of u/s of liver  [phone]

## 2014-06-12 DIAGNOSIS — I10 Essential (primary) hypertension: Secondary | ICD-10-CM | POA: Diagnosis not present

## 2014-06-12 DIAGNOSIS — F339 Major depressive disorder, recurrent, unspecified: Secondary | ICD-10-CM | POA: Diagnosis not present

## 2014-06-12 DIAGNOSIS — M6281 Muscle weakness (generalized): Secondary | ICD-10-CM | POA: Diagnosis not present

## 2014-06-12 DIAGNOSIS — I129 Hypertensive chronic kidney disease with stage 1 through stage 4 chronic kidney disease, or unspecified chronic kidney disease: Secondary | ICD-10-CM | POA: Diagnosis not present

## 2014-06-12 DIAGNOSIS — I4891 Unspecified atrial fibrillation: Secondary | ICD-10-CM | POA: Diagnosis not present

## 2014-06-12 DIAGNOSIS — I509 Heart failure, unspecified: Secondary | ICD-10-CM | POA: Diagnosis not present

## 2014-06-12 DIAGNOSIS — E119 Type 2 diabetes mellitus without complications: Secondary | ICD-10-CM | POA: Diagnosis not present

## 2014-06-12 DIAGNOSIS — D649 Anemia, unspecified: Secondary | ICD-10-CM | POA: Diagnosis not present

## 2014-06-12 DIAGNOSIS — N184 Chronic kidney disease, stage 4 (severe): Secondary | ICD-10-CM | POA: Diagnosis not present

## 2014-06-13 ENCOUNTER — Other Ambulatory Visit: Payer: Self-pay | Admitting: Family Medicine

## 2014-06-13 MED ORDER — NITROGLYCERIN 0.4 MG SL SUBL: 0.4000 mg | SUBLINGUAL_TABLET | SUBLINGUAL | Status: DC | PRN

## 2014-06-16 DIAGNOSIS — D649 Anemia, unspecified: Secondary | ICD-10-CM | POA: Diagnosis not present

## 2014-06-16 DIAGNOSIS — I129 Hypertensive chronic kidney disease with stage 1 through stage 4 chronic kidney disease, or unspecified chronic kidney disease: Secondary | ICD-10-CM | POA: Diagnosis not present

## 2014-06-16 DIAGNOSIS — F339 Major depressive disorder, recurrent, unspecified: Secondary | ICD-10-CM | POA: Diagnosis not present

## 2014-06-16 DIAGNOSIS — M6281 Muscle weakness (generalized): Secondary | ICD-10-CM | POA: Diagnosis not present

## 2014-06-16 DIAGNOSIS — N184 Chronic kidney disease, stage 4 (severe): Secondary | ICD-10-CM | POA: Diagnosis not present

## 2014-06-16 DIAGNOSIS — E119 Type 2 diabetes mellitus without complications: Secondary | ICD-10-CM | POA: Diagnosis not present

## 2014-06-16 DIAGNOSIS — I4891 Unspecified atrial fibrillation: Secondary | ICD-10-CM | POA: Diagnosis not present

## 2014-06-16 DIAGNOSIS — I10 Essential (primary) hypertension: Secondary | ICD-10-CM | POA: Diagnosis not present

## 2014-06-16 DIAGNOSIS — I509 Heart failure, unspecified: Secondary | ICD-10-CM | POA: Diagnosis not present

## 2014-06-17 ENCOUNTER — Emergency Department (HOSPITAL_COMMUNITY): Payer: Commercial Managed Care - HMO

## 2014-06-17 ENCOUNTER — Inpatient Hospital Stay (HOSPITAL_COMMUNITY)
Admission: EM | Admit: 2014-06-17 | Discharge: 2014-06-20 | DRG: 689 | Disposition: A | Discharge status: Home / Self Care | Payer: Commercial Managed Care - HMO | Attending: Internal Medicine | Admitting: Internal Medicine

## 2014-06-17 ENCOUNTER — Encounter (HOSPITAL_COMMUNITY): Payer: Self-pay | Admitting: Emergency Medicine

## 2014-06-17 DIAGNOSIS — B961 Klebsiella pneumoniae [K. pneumoniae] as the cause of diseases classified elsewhere: Secondary | ICD-10-CM | POA: Diagnosis present

## 2014-06-17 DIAGNOSIS — E780 Pure hypercholesterolemia: Secondary | ICD-10-CM | POA: Diagnosis present

## 2014-06-17 DIAGNOSIS — Z886 Allergy status to analgesic agent status: Secondary | ICD-10-CM | POA: Diagnosis not present

## 2014-06-17 DIAGNOSIS — N184 Chronic kidney disease, stage 4 (severe): Secondary | ICD-10-CM | POA: Diagnosis present

## 2014-06-17 DIAGNOSIS — Z955 Presence of coronary angioplasty implant and graft: Secondary | ICD-10-CM | POA: Diagnosis not present

## 2014-06-17 DIAGNOSIS — K219 Gastro-esophageal reflux disease without esophagitis: Secondary | ICD-10-CM | POA: Diagnosis present

## 2014-06-17 DIAGNOSIS — E871 Hypo-osmolality and hyponatremia: Secondary | ICD-10-CM

## 2014-06-17 DIAGNOSIS — N390 Urinary tract infection, site not specified: Secondary | ICD-10-CM | POA: Diagnosis not present

## 2014-06-17 DIAGNOSIS — Z85828 Personal history of other malignant neoplasm of skin: Secondary | ICD-10-CM

## 2014-06-17 DIAGNOSIS — E860 Dehydration: Secondary | ICD-10-CM | POA: Diagnosis present

## 2014-06-17 DIAGNOSIS — I2510 Atherosclerotic heart disease of native coronary artery without angina pectoris: Secondary | ICD-10-CM | POA: Diagnosis present

## 2014-06-17 DIAGNOSIS — Z7982 Long term (current) use of aspirin: Secondary | ICD-10-CM | POA: Diagnosis not present

## 2014-06-17 DIAGNOSIS — Z1612 Extended spectrum beta lactamase (ESBL) resistance: Secondary | ICD-10-CM

## 2014-06-17 DIAGNOSIS — G9340 Encephalopathy, unspecified: Secondary | ICD-10-CM | POA: Diagnosis present

## 2014-06-17 DIAGNOSIS — I272 Other secondary pulmonary hypertension: Secondary | ICD-10-CM | POA: Diagnosis present

## 2014-06-17 DIAGNOSIS — I129 Hypertensive chronic kidney disease with stage 1 through stage 4 chronic kidney disease, or unspecified chronic kidney disease: Secondary | ICD-10-CM | POA: Diagnosis present

## 2014-06-17 DIAGNOSIS — E1121 Type 2 diabetes mellitus with diabetic nephropathy: Secondary | ICD-10-CM | POA: Diagnosis not present

## 2014-06-17 DIAGNOSIS — R7989 Other specified abnormal findings of blood chemistry: Secondary | ICD-10-CM | POA: Diagnosis not present

## 2014-06-17 DIAGNOSIS — I252 Old myocardial infarction: Secondary | ICD-10-CM | POA: Diagnosis not present

## 2014-06-17 DIAGNOSIS — J984 Other disorders of lung: Secondary | ICD-10-CM | POA: Diagnosis not present

## 2014-06-17 DIAGNOSIS — Z8711 Personal history of peptic ulcer disease: Secondary | ICD-10-CM | POA: Diagnosis not present

## 2014-06-17 DIAGNOSIS — N289 Disorder of kidney and ureter, unspecified: Secondary | ICD-10-CM | POA: Diagnosis not present

## 2014-06-17 DIAGNOSIS — Z952 Presence of prosthetic heart valve: Secondary | ICD-10-CM | POA: Diagnosis not present

## 2014-06-17 DIAGNOSIS — Z794 Long term (current) use of insulin: Secondary | ICD-10-CM | POA: Diagnosis not present

## 2014-06-17 DIAGNOSIS — I10 Essential (primary) hypertension: Secondary | ICD-10-CM | POA: Diagnosis not present

## 2014-06-17 DIAGNOSIS — Z9049 Acquired absence of other specified parts of digestive tract: Secondary | ICD-10-CM | POA: Diagnosis present

## 2014-06-17 DIAGNOSIS — E222 Syndrome of inappropriate secretion of antidiuretic hormone: Secondary | ICD-10-CM | POA: Diagnosis not present

## 2014-06-17 DIAGNOSIS — G43909 Migraine, unspecified, not intractable, without status migrainosus: Secondary | ICD-10-CM | POA: Diagnosis present

## 2014-06-17 DIAGNOSIS — R778 Other specified abnormalities of plasma proteins: Secondary | ICD-10-CM | POA: Diagnosis present

## 2014-06-17 DIAGNOSIS — R7401 Elevation of levels of liver transaminase levels: Secondary | ICD-10-CM

## 2014-06-17 DIAGNOSIS — E861 Hypovolemia: Secondary | ICD-10-CM | POA: Diagnosis present

## 2014-06-17 DIAGNOSIS — Z951 Presence of aortocoronary bypass graft: Secondary | ICD-10-CM

## 2014-06-17 DIAGNOSIS — R188 Other ascites: Secondary | ICD-10-CM | POA: Diagnosis not present

## 2014-06-17 DIAGNOSIS — R410 Disorientation, unspecified: Secondary | ICD-10-CM | POA: Diagnosis not present

## 2014-06-17 DIAGNOSIS — N179 Acute kidney failure, unspecified: Secondary | ICD-10-CM | POA: Diagnosis present

## 2014-06-17 DIAGNOSIS — M1990 Unspecified osteoarthritis, unspecified site: Secondary | ICD-10-CM | POA: Diagnosis present

## 2014-06-17 DIAGNOSIS — M8580 Other specified disorders of bone density and structure, unspecified site: Secondary | ICD-10-CM | POA: Diagnosis present

## 2014-06-17 DIAGNOSIS — I5032 Chronic diastolic (congestive) heart failure: Secondary | ICD-10-CM | POA: Diagnosis not present

## 2014-06-17 DIAGNOSIS — J9811 Atelectasis: Secondary | ICD-10-CM | POA: Diagnosis not present

## 2014-06-17 DIAGNOSIS — I509 Heart failure, unspecified: Secondary | ICD-10-CM | POA: Diagnosis not present

## 2014-06-17 DIAGNOSIS — I248 Other forms of acute ischemic heart disease: Secondary | ICD-10-CM | POA: Diagnosis not present

## 2014-06-17 DIAGNOSIS — D649 Anemia, unspecified: Secondary | ICD-10-CM

## 2014-06-17 DIAGNOSIS — R740 Nonspecific elevation of levels of transaminase and lactic acid dehydrogenase [LDH]: Secondary | ICD-10-CM

## 2014-06-17 DIAGNOSIS — Z66 Do not resuscitate: Secondary | ICD-10-CM | POA: Diagnosis present

## 2014-06-17 DIAGNOSIS — A498 Other bacterial infections of unspecified site: Secondary | ICD-10-CM | POA: Diagnosis present

## 2014-06-17 DIAGNOSIS — E1122 Type 2 diabetes mellitus with diabetic chronic kidney disease: Secondary | ICD-10-CM | POA: Diagnosis not present

## 2014-06-17 DIAGNOSIS — R945 Abnormal results of liver function studies: Secondary | ICD-10-CM

## 2014-06-17 DIAGNOSIS — E119 Type 2 diabetes mellitus without complications: Secondary | ICD-10-CM

## 2014-06-17 LAB — COMPREHENSIVE METABOLIC PANEL
ALT: 38 U/L — ABNORMAL HIGH (ref 0–35)
AST: 50 U/L — AB (ref 0–37)
Albumin: 3.2 g/dL — ABNORMAL LOW (ref 3.5–5.2)
Alkaline Phosphatase: 540 U/L — ABNORMAL HIGH (ref 39–117)
Anion gap: 11 (ref 5–15)
BUN: 63 mg/dL — ABNORMAL HIGH (ref 6–23)
CALCIUM: 9.1 mg/dL (ref 8.4–10.5)
CO2: 25 mmol/L (ref 19–32)
CREATININE: 2.29 mg/dL — AB (ref 0.50–1.10)
Chloride: 94 mmol/L — ABNORMAL LOW (ref 96–112)
GFR calc Af Amer: 22 mL/min — ABNORMAL LOW (ref >90)
GFR calc non Af Amer: 19 mL/min — ABNORMAL LOW (ref >90)
Glucose, Bld: 86 mg/dL (ref 70–99)
Potassium: 4.4 mmol/L (ref 3.5–5.1)
Sodium: 130 mmol/L — ABNORMAL LOW (ref 135–145)
TOTAL PROTEIN: 7.9 g/dL (ref 6.0–8.3)
Total Bilirubin: 1.1 mg/dL (ref 0.3–1.2)

## 2014-06-17 LAB — TROPONIN I
Troponin I: 0.04 ng/mL — ABNORMAL HIGH (ref <0.031)
Troponin I: 0.03 ng/mL (ref <0.031)

## 2014-06-17 LAB — GLUCOSE, CAPILLARY: Glucose-Capillary: 83 mg/dL (ref 70–99)

## 2014-06-17 LAB — CBC WITH DIFFERENTIAL/PLATELET
Basophils Absolute: 0 10*3/uL (ref 0.0–0.1)
Basophils Relative: 1 % (ref 0–1)
EOS PCT: 2 % (ref 0–5)
Eosinophils Absolute: 0.1 10*3/uL (ref 0.0–0.7)
HEMATOCRIT: 34.1 % — AB (ref 36.0–46.0)
Hemoglobin: 10.8 g/dL — ABNORMAL LOW (ref 12.0–15.0)
LYMPHS ABS: 1 10*3/uL (ref 0.7–4.0)
Lymphocytes Relative: 15 % (ref 12–46)
MCH: 28.3 pg (ref 26.0–34.0)
MCHC: 31.7 g/dL (ref 30.0–36.0)
MCV: 89.5 fL (ref 78.0–100.0)
MONOS PCT: 17 % — AB (ref 3–12)
Monocytes Absolute: 1.1 10*3/uL — ABNORMAL HIGH (ref 0.1–1.0)
Neutro Abs: 4.3 10*3/uL (ref 1.7–7.7)
Neutrophils Relative %: 65 % (ref 43–77)
Platelets: 212 10*3/uL (ref 150–400)
RBC: 3.81 MIL/uL — AB (ref 3.87–5.11)
RDW: 17.8 % — ABNORMAL HIGH (ref 11.5–15.5)
WBC: 6.5 10*3/uL (ref 4.0–10.5)

## 2014-06-17 LAB — URINALYSIS, ROUTINE W REFLEX MICROSCOPIC
Bilirubin Urine: NEGATIVE
Glucose, UA: NEGATIVE mg/dL
HGB URINE DIPSTICK: NEGATIVE
Ketones, ur: NEGATIVE mg/dL
Nitrite: NEGATIVE
Protein, ur: NEGATIVE mg/dL
Specific Gravity, Urine: 1.015 (ref 1.005–1.030)
Urobilinogen, UA: 0.2 mg/dL (ref 0.0–1.0)
pH: 5 (ref 5.0–8.0)

## 2014-06-17 LAB — AMMONIA: AMMONIA: 35 umol/L — AB (ref 11–32)

## 2014-06-17 LAB — URINE MICROSCOPIC-ADD ON

## 2014-06-17 LAB — BRAIN NATRIURETIC PEPTIDE: B Natriuretic Peptide: 888 pg/mL — ABNORMAL HIGH (ref 0.0–100.0)

## 2014-06-17 LAB — LACTIC ACID, PLASMA
LACTIC ACID, VENOUS: 0.9 mmol/L (ref 0.5–2.0)
LACTIC ACID, VENOUS: 0.9 mmol/L (ref 0.5–2.0)

## 2014-06-17 MED ORDER — METOPROLOL TARTRATE 25 MG PO TABS
25.0000 mg | ORAL_TABLET | Freq: Two times a day (BID) | ORAL | Status: DC
  Administered 2014-06-17 – 2014-06-19 (×5): 25 mg via ORAL
  Filled 2014-06-17 (×6): qty 1

## 2014-06-17 MED ORDER — ONDANSETRON HCL 4 MG/2ML IJ SOLN: 4.0000 mg | Freq: Four times a day (QID) | INTRAMUSCULAR | Status: DC | PRN

## 2014-06-17 MED ORDER — DEXTROSE 5 % IV SOLN
1.0000 g | Freq: Once | INTRAVENOUS | Status: AC
  Administered 2014-06-17: 1 g via INTRAVENOUS
  Filled 2014-06-17: qty 10

## 2014-06-17 MED ORDER — SIMVASTATIN 20 MG PO TABS
20.0000 mg | ORAL_TABLET | Freq: Every day | ORAL | Status: DC
  Administered 2014-06-18 – 2014-06-19 (×2): 20 mg via ORAL
  Filled 2014-06-17 (×2): qty 1

## 2014-06-17 MED ORDER — CEFTRIAXONE SODIUM IN DEXTROSE 20 MG/ML IV SOLN
1.0000 g | INTRAVENOUS | Status: DC
  Filled 2014-06-17: qty 50

## 2014-06-17 MED ORDER — PANTOPRAZOLE SODIUM 40 MG PO TBEC
40.0000 mg | DELAYED_RELEASE_TABLET | Freq: Every day | ORAL | Status: DC
  Administered 2014-06-18 – 2014-06-20 (×3): 40 mg via ORAL
  Filled 2014-06-17 (×3): qty 1

## 2014-06-17 MED ORDER — OXYCODONE HCL 5 MG PO TABS
2.5000 mg | ORAL_TABLET | ORAL | Status: DC | PRN
  Administered 2014-06-18 – 2014-06-20 (×8): 2.5 mg via ORAL
  Filled 2014-06-17 (×8): qty 1

## 2014-06-17 MED ORDER — IPRATROPIUM-ALBUTEROL 0.5-2.5 (3) MG/3ML IN SOLN: 3.0000 mL | Freq: Four times a day (QID) | RESPIRATORY_TRACT | Status: DC | PRN

## 2014-06-17 MED ORDER — ASPIRIN 81 MG PO CHEW
162.0000 mg | CHEWABLE_TABLET | Freq: Every day | ORAL | Status: DC
Start: 2014-06-18 — End: 2014-06-20
  Administered 2014-06-18 – 2014-06-20 (×3): 162 mg via ORAL
  Filled 2014-06-17 (×4): qty 2

## 2014-06-17 MED ORDER — ACETAMINOPHEN 650 MG RE SUPP
650.0000 mg | Freq: Four times a day (QID) | RECTAL | Status: DC | PRN
Start: 2014-06-17 — End: 2014-06-20

## 2014-06-17 MED ORDER — NITROGLYCERIN 0.4 MG SL SUBL: 0.4000 mg | SUBLINGUAL_TABLET | SUBLINGUAL | Status: DC | PRN

## 2014-06-17 MED ORDER — GLUCERNA SHAKE PO LIQD
237.0000 mL | Freq: Three times a day (TID) | ORAL | Status: DC
  Administered 2014-06-18 – 2014-06-19 (×4): 237 mL via ORAL

## 2014-06-17 MED ORDER — INSULIN ASPART 100 UNIT/ML ~~LOC~~ SOLN: 0.0000 [IU] | Freq: Three times a day (TID) | SUBCUTANEOUS | Status: DC

## 2014-06-17 MED ORDER — TRAZODONE HCL 50 MG PO TABS
50.0000 mg | ORAL_TABLET | Freq: Every day | ORAL | Status: DC
  Administered 2014-06-17 – 2014-06-19 (×3): 50 mg via ORAL
  Filled 2014-06-17 (×3): qty 1

## 2014-06-17 MED ORDER — ACETAMINOPHEN 500 MG PO TABS
1000.0000 mg | ORAL_TABLET | Freq: Four times a day (QID) | ORAL | Status: DC | PRN
  Administered 2014-06-20: 1000 mg via ORAL
  Filled 2014-06-17 (×3): qty 2

## 2014-06-17 MED ORDER — POLYETHYLENE GLYCOL 3350 17 G PO PACK: 17.0000 g | PACK | ORAL | Status: DC

## 2014-06-17 MED ORDER — ONDANSETRON HCL 4 MG PO TABS
4.0000 mg | ORAL_TABLET | Freq: Four times a day (QID) | ORAL | Status: DC | PRN
  Administered 2014-06-19: 4 mg via ORAL
  Filled 2014-06-17: qty 1

## 2014-06-17 MED ORDER — ACETAMINOPHEN 325 MG PO TABS: 650.0000 mg | ORAL_TABLET | Freq: Four times a day (QID) | ORAL | Status: DC | PRN

## 2014-06-17 MED ORDER — ENOXAPARIN SODIUM 30 MG/0.3ML ~~LOC~~ SOLN
30.0000 mg | SUBCUTANEOUS | Status: DC
  Administered 2014-06-17 – 2014-06-19 (×3): 30 mg via SUBCUTANEOUS
  Filled 2014-06-17 (×3): qty 0.3

## 2014-06-17 MED ORDER — SODIUM CHLORIDE 0.9 % IV SOLN
INTRAVENOUS | Status: AC
  Administered 2014-06-17: 22:00:00 via INTRAVENOUS

## 2014-06-17 NOTE — ED Provider Notes (Signed)
CSN: [id number]     Arrival date & time 06/17/14  1714 History   First MD Initiated Contact with Patient 06/17/14 1729     Chief Complaint  Patient presents with  . Urinary Tract Infection      HPI Pt was seen at 1735. Per pt and her son, c/o gradual onset and worsening of persistent "confusion" and generalized weakness for the past 1 to 2 weeks. Pt's son states pt will intermittently "have her head down" and "not respond" during the day, so he has to "snap my fingers and call her name to get her to respond to me." Pt was evaluated by her PMD last week and was dx with UTI, rx cipro. Pt only c/o mild intermittent nausea, but denies any other complaints. Denies vomiting/diarrhea, no abd pain, no CP/SOB, no fevers, no back pain, no falls, no seizure activity, no apnea.    Past Medical History  Diagnosis Date  . GERD (gastroesophageal reflux disease)   . PUD (peptic ulcer disease)   . Anemia   . Hypertension   . S/P endoscopy Aug 2011    3 superficial gastric ulcers, NSAID-induced  . S/P colonoscopy Sept 2011    left-sided diverticula, tubular adenoma  . Coronary artery disease   . Shortness of breath   . Diabetes mellitus     insulin dependent  . Headache(784.0)     rare migraines  . Cancer     hx of skin cancer  . Arthritis   . Osteopenia   . Hypercholesterolemia   . SIADH (syndrome of inappropriate ADH production)   . CHF (congestive heart failure)   . Myocardial infarction 2013  . Chronic renal failure, stage 3 (moderate)    Past Surgical History  Procedure Laterality Date  . Tonsillectomy    . Appendectomy    . Eye surgery      cataracts  . Cardiac stents  07/19/2011  . Esophagogastroduodenoscopy  10/16/09    normal without barrett's/three superficial gastric ulcers  . Colonoscopy  12/04/09    normal rectum/left-sided diverticula  . Joint replacement  arthroscopy to knee  . Toe amputation Left 2013    left second toe amputation  . Cardiac catheterization  01/22/2013   . Coronary angioplasty  07/2011  . Coronary artery bypass graft N/A 11/08/2013    Procedure: CORONARY ARTERY BYPASS GRAFTING (CABG), on pump, times two, using left internal mammary artery, cryo saphenous vein.;  Surgeon: Ivin Poot, MD;  Location: [place];  Service: Open Heart Surgery;  Laterality: N/A;  LIMA-LAD CRYOVEIN -OM  . Intraoperative transesophageal echocardiogram N/A 11/08/2013    Procedure: INTRAOPERATIVE TRANSESOPHAGEAL ECHOCARDIOGRAM;  Surgeon: Ivin Poot, MD;  Location: [place];  Service: Open Heart Surgery;  Laterality: N/A;  . Mitral valve replacement N/A 11/08/2013    Procedure: MITRAL VALVE (MV) REPLACEMENT;  Surgeon: Ivin Poot, MD;  Location: [place];  Service: Open Heart Surgery;  Laterality: N/A;  #25 MAGNA MITRAL EASE  . Left heart catheterization with coronary angiogram N/A 07/19/2011    Procedure: LEFT HEART CATHETERIZATION WITH CORONARY ANGIOGRAM;  Surgeon: Laverda Page, MD;  Location: [facility] CATH LAB;  Service: Cardiovascular;  Laterality: N/A;  . Left heart catheterization with coronary angiogram N/A 12/21/2011    Procedure: LEFT HEART CATHETERIZATION WITH CORONARY ANGIOGRAM;  Surgeon: Laverda Page, MD;  Location: [facility] CATH LAB;  Service: Cardiovascular;  Laterality: N/A;  . Left heart catheterization with coronary angiogram N/A 02/20/2012    Procedure: LEFT HEART CATHETERIZATION  WITH CORONARY ANGIOGRAM;  Surgeon: Laverda Page, MD;  Location: [facility] CATH LAB;  Service: Cardiovascular;  Laterality: N/A;  . Left heart catheterization with coronary angiogram N/A 09/03/2012    Procedure: LEFT HEART CATHETERIZATION WITH CORONARY ANGIOGRAM;  Surgeon: Laverda Page, MD;  Location: [facility] CATH LAB;  Service: Cardiovascular;  Laterality: N/A;  . Percutaneous coronary stent intervention (pci-s)  09/03/2012    Procedure: PERCUTANEOUS CORONARY STENT INTERVENTION (PCI-S);  Surgeon: Laverda Page, MD;  Location: [facility] CATH LAB;  Service: Cardiovascular;;  . Left heart  catheterization with coronary angiogram N/A 12/04/2012    Procedure: LEFT HEART CATHETERIZATION WITH CORONARY ANGIOGRAM;  Surgeon: Laverda Page, MD;  Location: [facility] CATH LAB;  Service: Cardiovascular;  Laterality: N/A;  . Left heart catheterization with coronary angiogram N/A 01/22/2013    Procedure: LEFT HEART CATHETERIZATION WITH CORONARY ANGIOGRAM;  Surgeon: Laverda Page, MD;  Location: [facility] CATH LAB;  Service: Cardiovascular;  Laterality: N/A;  . Left heart catheterization with coronary angiogram N/A 06/26/2013    Procedure: LEFT HEART CATHETERIZATION WITH CORONARY ANGIOGRAM;  Surgeon: Laverda Page, MD;  Location: [facility] CATH LAB;  Service: Cardiovascular;  Laterality: N/A;  . Left heart catheterization with coronary angiogram N/A 11/05/2013    Procedure: LEFT HEART CATHETERIZATION WITH CORONARY ANGIOGRAM;  Surgeon: Laverda Page, MD;  Location: [facility] CATH LAB;  Service: Cardiovascular;  Laterality: N/A;    History  Substance Use Topics  . Smoking status: Never Smoker   . Smokeless tobacco: Never Used  . Alcohol Use: No    Review of Systems ROS: Statement: All systems negative except as marked or noted in the HPI; Constitutional: Negative for fever and chills. ; ; Eyes: Negative for eye pain, redness and discharge. ; ; ENMT: Negative for ear pain, hoarseness, nasal congestion, sinus pressure and sore throat. ; ; Cardiovascular: Negative for chest pain, palpitations, diaphoresis, dyspnea and peripheral edema. ; ; Respiratory: Negative for cough, wheezing and stridor. ; ; Gastrointestinal: +nausea. Negative for vomiting, diarrhea, abdominal pain, blood in stool, hematemesis, jaundice and rectal bleeding. . ; ; Genitourinary: Negative for dysuria, flank pain and hematuria. ; ; Musculoskeletal: Negative for back pain and neck pain. Negative for swelling and trauma.; ; Skin: Negative for pruritus, rash, abrasions, blisters, bruising and skin lesion.; ; Neuro: +"confusion," "not responding,"  generalized weakness. Negative for headache, lightheadedness and neck stiffness. Negative for extremity weakness, paresthesias, involuntary movement, seizure.    Allergies  Aspirin  Home Medications   Prior to Admission medications   Medication Sig Start Date End Date Taking? Authorizing Provider  acetaminophen (TYLENOL) 500 MG tablet Take 2 tablets (1,000 mg total) by mouth every 6 (six) hours as needed. 06/04/14  Yes Susy Frizzle, MD  ascorbic acid (VITAMIN C) 500 MG tablet Take 1 tablet (500 mg total) by mouth 2 (two) times daily. 06/04/14  Yes Susy Frizzle, MD  aspirin 81 MG chewable tablet Chew 2 tablets (162 mg total) by mouth daily. 06/04/14  Yes Susy Frizzle, MD  cetirizine (ZYRTEC) 10 MG tablet Take 1 tablet (10 mg total) by mouth at bedtime. 06/04/14  Yes Susy Frizzle, MD  feeding supplement, GLUCERNA SHAKE, (GLUCERNA SHAKE) LIQD Take 237 mLs by mouth 3 (three) times daily with meals. 12/13/13  Yes Donielle Liston Alba, PA-C  ferrous sulfate 325 (65 FE) MG tablet Take 1 tablet (325 mg total) by mouth 2 (two) times daily with a meal. 06/04/14  Yes Susy Frizzle, MD  fluticasone [facility]) 50  MCG/ACT nasal spray Place 1 spray into both nostrils daily. 06/04/14  Yes Susy Frizzle, MD  insulin aspart (NOVOLOG FLEXPEN) 100 UNIT/ML FlexPen 70-120=0    251-300=5 121-150=1  301-350=7 151-200=2  351-400=9 201-250=3  400 or >, call MD  units of insulin Patient taking differently: Inject 0-10 Units into the skin 3 (three) times daily with meals. 70-120=0    251-300=5 121-150=1  301-350=7 151-200=2  351-400=9 201-250=3  400 or >, call MD  units of insulin 06/04/14  Yes Susy Frizzle, MD  Insulin Glargine (LANTUS) 100 UNIT/ML Solostar Pen Inject 12 Units into the skin 2 (two) times daily. 06/06/14  Yes Susy Frizzle, MD  ipratropium-albuterol (DUONEB) 0.5-2.5 (3) MG/3ML SOLN Take 3 mLs by nebulization every 6 (six) hours as needed. 06/04/14  Yes Susy Frizzle, MD   Melatonin 3 MG TABS Take 1.5 mg by mouth at bedtime as needed (insomnia).    Yes Historical Provider, MD  metoprolol tartrate (LOPRESSOR) 25 MG tablet Take 1 tablet (25 mg total) by mouth 2 (two) times daily. 06/04/14  Yes Susy Frizzle, MD  nitrofurantoin, macrocrystal-monohydrate, (MACROBID) 100 MG capsule Take 1 capsule (100 mg total) by mouth 2 (two) times daily. 06/11/14  Yes Susy Frizzle, MD  nitroGLYCERIN (NITROSTAT) 0.4 MG SL tablet Place 1 tablet (0.4 mg total) under the tongue every 5 (five) minutes as needed for chest pain. 06/13/14  Yes Susy Frizzle, MD  ondansetron (ZOFRAN) 4 MG tablet Take 1 tablet (4 mg total) by mouth every 6 (six) hours as needed for nausea or vomiting. 06/04/14  Yes Susy Frizzle, MD  oxyCODONE (ROXICODONE) 5 MG immediate release tablet Take 0.5 tablets (2.5 mg total) by mouth every 4 (four) hours as needed for severe pain. 06/06/14  Yes Susy Frizzle, MD  pantoprazole (PROTONIX) 40 MG tablet Take 1 tablet (40 mg total) by mouth daily. 06/09/14  Yes Susy Frizzle, MD  polyethylene glycol [facility] / GLYCOLAX) packet Take 17 g by mouth every other day. 06/04/14  Yes Susy Frizzle, MD  sodium chloride 1 G tablet Take 1 g by mouth 2 (two) times daily.   Yes Historical Provider, MD  torsemide (DEMADEX) 20 MG tablet Take 1 tablet (20 mg total) by mouth 2 (two) times daily. 06/04/14  Yes Susy Frizzle, MD  traZODone (DESYREL) 50 MG tablet Take 1 tablet (50 mg total) by mouth at bedtime. 06/04/14  Yes Susy Frizzle, MD  ACCU-CHEK FASTCLIX LANCETS MISC Check BS QID - Fasting, before meals and QHS 06/04/14   Susy Frizzle, MD  B-D ULTRAFINE III SHORT PEN 31G X 8 MM MISC 5 each by Does not apply route 5 (five) times daily. 06/06/14   Susy Frizzle, MD  Blood Glucose Monitoring Suppl (ACCU-CHEK AVIVA) device Use as instructed 06/06/14 06/06/15  Susy Frizzle, MD  ciprofloxacin (CIPRO) 250 MG tablet Take 1 tablet (250 mg total) by mouth 2 (two) times  daily. Patient not taking: Reported on 06/17/2014 05/29/14   Susy Frizzle, MD  ciprofloxacin (CIPRO) 250 MG tablet Take 1 tablet (250 mg total) by mouth 2 (two) times daily. Patient not taking: Reported on 06/17/2014 06/06/14   Susy Frizzle, MD  glucose blood (ACCU-CHEK SMARTVIEW) test strip Check BS QID - Dx: E11.9 06/04/14   Susy Frizzle, MD  sennosides-docusate sodium (SENOKOT-S) 8.6-50 MG tablet Take 1 tablet by mouth 2 (two) times daily. Patient not taking: Reported on 06/17/2014 06/04/14  Susy Frizzle, MD  simvastatin (ZOCOR) 20 MG tablet Take 1 tablet (20 mg total) by mouth daily. 06/09/14   Susy Frizzle, MD  UNABLE TO FIND 1 each by Does not apply route once. Datil BED 05/13/14   Susy Frizzle, MD  UNABLE TO FIND 1 each by Does not apply route once. Deliver ONE BEDSIDE COMMODE 05/13/14   Susy Frizzle, MD  UNABLE TO FIND 1 each by Does not apply route once. Deliver ONE Vassie Moselle WITH SEAT 05/13/14   Susy Frizzle, MD   BP 139/64 mmHg  Pulse 63  Temp(Src) 99.2 F (37.3 C) (Rectal)  Resp 17  Ht 5\' 7"  (1.702 m)  Wt 183 lb (83.008 kg)  BMI 28.66 kg/m2  SpO2 97% Physical Exam  1740: Physical examination:  Nursing notes reviewed; Vital signs and O2 SAT reviewed;  Constitutional: Well developed, Well nourished, Well hydrated, In no acute distress; Head:  Normocephalic, atraumatic; Eyes: EOMI, PERRL, No scleral icterus; ENMT: Mouth and pharynx normal, Mucous membranes moist; Neck: Supple, Full range of motion, No lymphadenopathy; Cardiovascular: Regular rate and rhythm, No gallop; Respiratory: Breath sounds clear & equal bilaterally, No wheezes.  Speaking full sentences with ease, Normal respiratory effort/excursion; Chest: Nontender, Movement normal; Abdomen: Soft, Nontender, Nondistended, Normal bowel sounds; Genitourinary: No CVA tenderness; Extremities: Pulses normal, No tenderness, +2 pedal edema bilat without calf asymmetry.; Neuro: Awake, alert, mildly  confused re: events. Major CN grossly intact. Speech clear.  No facial droop.  No nystagmus. Grips equal. Strength 5/5 equal bilat UE's and LE's.  DTR 2/4 equal bilat UE's and LE's.  No gross sensory deficits.  Normal cerebellar testing bilat UE's (finger-nose) and LE's (heel-shin)..; Skin: Color normal, Warm, Dry.   ED Course  Procedures     EKG Interpretation   Date/Time:  Tuesday June 17 2014 17:51:14 EDT Ventricular Rate:  61 PR Interval:  161 QRS Duration: 93 QT Interval:  450 QTC Calculation: 453 R Axis:   20 Text Interpretation:  Sinus rhythm Repol abnrm suggests ischemia, lateral  leads Baseline wander Artifact When compared with ECG of 02/23/2014 No  significant change was found Confirmed by [facility]  MD, Nunzio Cory [phone])  on 06/17/2014 6:26:17 PM       MDM  MDM Reviewed: previous chart, nursing note and vitals Reviewed previous: labs and ECG Interpretation: labs, ECG and x-ray     Results for orders placed or performed during the hospital encounter of 06/17/14  Urinalysis, Routine w reflex microscopic  Result Value Ref Range   Color, Urine YELLOW YELLOW   APPearance CLEAR CLEAR   Specific Gravity, Urine 1.015 1.005 - 1.030   pH 5.0 5.0 - 8.0   Glucose, UA NEGATIVE NEGATIVE mg/dL   Hgb urine dipstick NEGATIVE NEGATIVE   Bilirubin Urine NEGATIVE NEGATIVE   Ketones, ur NEGATIVE NEGATIVE mg/dL   Protein, ur NEGATIVE NEGATIVE mg/dL   Urobilinogen, UA 0.2 0.0 - 1.0 mg/dL   Nitrite NEGATIVE NEGATIVE   Leukocytes, UA SMALL (A) NEGATIVE  Comprehensive metabolic panel  Result Value Ref Range   Sodium 130 (L) 135 - 145 mmol/L   Potassium 4.4 3.5 - 5.1 mmol/L   Chloride 94 (L) 96 - 112 mmol/L   CO2 25 19 - 32 mmol/L   Glucose, Bld 86 70 - 99 mg/dL   BUN 63 (H) 6 - 23 mg/dL   Creatinine, Ser 2.29 (H) 0.50 - 1.10 mg/dL   Calcium 9.1 8.4 - 10.5 mg/dL   Total Protein  7.9 6.0 - 8.3 g/dL   Albumin 3.2 (L) 3.5 - 5.2 g/dL   AST 50 (H) 0 - 37 U/L   ALT 38 (H) 0 - 35  U/L   Alkaline Phosphatase 540 (H) 39 - 117 U/L   Total Bilirubin 1.1 0.3 - 1.2 mg/dL   GFR calc non Af Amer 19 (L) >90 mL/min   GFR calc Af Amer 22 (L) >90 mL/min   Anion gap 11 5 - 15  CBC with Differential  Result Value Ref Range   WBC 6.5 4.0 - 10.5 K/uL   RBC 3.81 (L) 3.87 - 5.11 MIL/uL   Hemoglobin 10.8 (L) 12.0 - 15.0 g/dL   HCT 34.1 (L) 36.0 - 46.0 %   MCV 89.5 78.0 - 100.0 fL   MCH 28.3 26.0 - 34.0 pg   MCHC 31.7 30.0 - 36.0 g/dL   RDW 17.8 (H) 11.5 - 15.5 %   Platelets 212 150 - 400 K/uL   Neutrophils Relative % 65 43 - 77 %   Neutro Abs 4.3 1.7 - 7.7 K/uL   Lymphocytes Relative 15 12 - 46 %   Lymphs Abs 1.0 0.7 - 4.0 K/uL   Monocytes Relative 17 (H) 3 - 12 %   Monocytes Absolute 1.1 (H) 0.1 - 1.0 K/uL   Eosinophils Relative 2 0 - 5 %   Eosinophils Absolute 0.1 0.0 - 0.7 K/uL   Basophils Relative 1 0 - 1 %   Basophils Absolute 0.0 0.0 - 0.1 K/uL  Troponin I  Result Value Ref Range   Troponin I 0.04 (H) <0.031 ng/mL  Lactic acid, plasma  Result Value Ref Range   Lactic Acid, Venous 0.9 0.5 - 2.0 mmol/L  Ammonia  Result Value Ref Range   Ammonia 35 (H) 11 - 32 umol/L  Urine microscopic-add on  Result Value Ref Range   Squamous Epithelial / LPF FEW (A) RARE   WBC, UA 7-10 <3 WBC/hpf   Bacteria, UA MANY (A) RARE   Dg Chest 2 View 06/17/2014   CLINICAL DATA:  Confusion. "Kidney infection" .  EXAM: CHEST  2 VIEW  COMPARISON:  05/30/2014  FINDINGS: Lateral view degraded by patient arm position. Prior median sternotomy. Lateral view else degraded by artifact posteriorly.  Mitral valve repair. Chin overlies the right apex. Cardiomegaly accentuated by AP portable technique. Atherosclerosis in the transverse aorta. No definite pleural fluid. Diminished lung volumes. Suspect mild interstitial edema. Left mid lung scarring. Minimal bibasilar volume loss.  IMPRESSION: Cardiomegaly with diminished lung volumes. Suspect mild interstitial edema.  Left mid lung scarring with mild  bibasilar volume loss and subsegmental atelectasis.   Electronically Signed   By: Abigail Miyamoto M.D.   On: 06/17/2014 18:47    1940:  BUN/Cr, H/H, LFT's and Na near baseline. Ammonia mildly elevated from previous. Troponin also mildly elevated, but EKG without new STTW changes. +UTI, UC pending; will dose IV rocephin. Dx and testing d/w pt and family.  Questions answered.  Verb understanding, agreeable to admit.  T/C to [facility] Dr. Darrick Meigs, case discussed, including:  HPI, pertinent PM/SHx, VS/PE, dx testing, ED course and treatment:  Agreeable to admit, requests to write temporary orders, obtain tele bed to team APAdmits.     Francine Graven, DO 06/19/14 [phone]

## 2014-06-17 NOTE — H&P (Addendum)
PCP:   Odette Fraction, MD   Chief Complaint:  Lethargy  HPI:  81 year old female who  has a past medical history of GERD (gastroesophageal reflux disease); PUD (peptic ulcer disease); Anemia; Hypertension; S/P endoscopy (Aug 2011); S/P colonoscopy (Sept 2011); Coronary artery disease; Shortness of breath; Diabetes mellitus; Headache(784.0); Cancer; Arthritis; Osteopenia; Hypercholesterolemia; SIADH (syndrome of inappropriate ADH production); CHF (congestive heart failure); Myocardial infarction (2013); and Chronic renal failure, stage 3 (moderate). Today was brought to the hospital by patient's son for worsening lethargy and weakness. As per patient's son she was diagnosed with UTI last week and started on Cipro, the antibiotics were changed  by PCP as per patient's son as Cipro did not work. Patient continues to feel generalized weakness, lethargy. She denies dysuria but complains of taking long time to empty the bladder. Patient has been having episodic memory loss and intermittently not responding during the day. As per patient's son this has been going on for a while. She does not have a history of stroke or seizures in the past. Patient denies any chest pain or shortness of breath no nausea vomiting or diarrhea. She denies fever. In the ED patient found to have low blood sugar 86. She is on Lantus 12 units twice a day. Troponin mildly elevated 0.04, EKG shows ST-T changes in the lateral leads.  Allergies:   Allergies  Allergen Reactions  . Aspirin Other (See Comments)    High doses caused stomach bleeds      Past Medical History  Diagnosis Date  . GERD (gastroesophageal reflux disease)   . PUD (peptic ulcer disease)   . Anemia   . Hypertension   . S/P endoscopy Aug 2011    3 superficial gastric ulcers, NSAID-induced  . S/P colonoscopy Sept 2011    left-sided diverticula, tubular adenoma  . Coronary artery disease   . Shortness of breath   . Diabetes mellitus     insulin  dependent  . Headache(784.0)     rare migraines  . Cancer     hx of skin cancer  . Arthritis   . Osteopenia   . Hypercholesterolemia   . SIADH (syndrome of inappropriate ADH production)   . CHF (congestive heart failure)   . Myocardial infarction 2013  . Chronic renal failure, stage 3 (moderate)     Past Surgical History  Procedure Laterality Date  . Tonsillectomy    . Appendectomy    . Eye surgery      cataracts  . Cardiac stents  07/19/2011  . Esophagogastroduodenoscopy  10/16/09    normal without barrett's/three superficial gastric ulcers  . Colonoscopy  12/04/09    normal rectum/left-sided diverticula  . Joint replacement  arthroscopy to knee  . Toe amputation Left 2013    left second toe amputation  . Cardiac catheterization  01/22/2013  . Coronary angioplasty  07/2011  . Coronary artery bypass graft N/A 11/08/2013    Procedure: CORONARY ARTERY BYPASS GRAFTING (CABG), on pump, times two, using left internal mammary artery, cryo saphenous vein.;  Surgeon: Ivin Poot, MD;  Location: [place];  Service: Open Heart Surgery;  Laterality: N/A;  LIMA-LAD CRYOVEIN -OM  . Intraoperative transesophageal echocardiogram N/A 11/08/2013    Procedure: INTRAOPERATIVE TRANSESOPHAGEAL ECHOCARDIOGRAM;  Surgeon: Ivin Poot, MD;  Location: [place];  Service: Open Heart Surgery;  Laterality: N/A;  . Mitral valve replacement N/A 11/08/2013    Procedure: MITRAL VALVE (MV) REPLACEMENT;  Surgeon: Ivin Poot, MD;  Location: [place];  Service: Open Heart Surgery;  Laterality: N/A;  #25 MAGNA MITRAL EASE  . Left heart catheterization with coronary angiogram N/A 07/19/2011    Procedure: LEFT HEART CATHETERIZATION WITH CORONARY ANGIOGRAM;  Surgeon: Laverda Page, MD;  Location: [facility] CATH LAB;  Service: Cardiovascular;  Laterality: N/A;  . Left heart catheterization with coronary angiogram N/A 12/21/2011    Procedure: LEFT HEART CATHETERIZATION WITH CORONARY ANGIOGRAM;  Surgeon: Laverda Page, MD;   Location: [facility] CATH LAB;  Service: Cardiovascular;  Laterality: N/A;  . Left heart catheterization with coronary angiogram N/A 02/20/2012    Procedure: LEFT HEART CATHETERIZATION WITH CORONARY ANGIOGRAM;  Surgeon: Laverda Page, MD;  Location: [facility] CATH LAB;  Service: Cardiovascular;  Laterality: N/A;  . Left heart catheterization with coronary angiogram N/A 09/03/2012    Procedure: LEFT HEART CATHETERIZATION WITH CORONARY ANGIOGRAM;  Surgeon: Laverda Page, MD;  Location: [facility] CATH LAB;  Service: Cardiovascular;  Laterality: N/A;  . Percutaneous coronary stent intervention (pci-s)  09/03/2012    Procedure: PERCUTANEOUS CORONARY STENT INTERVENTION (PCI-S);  Surgeon: Laverda Page, MD;  Location: [facility] CATH LAB;  Service: Cardiovascular;;  . Left heart catheterization with coronary angiogram N/A 12/04/2012    Procedure: LEFT HEART CATHETERIZATION WITH CORONARY ANGIOGRAM;  Surgeon: Laverda Page, MD;  Location: [facility] CATH LAB;  Service: Cardiovascular;  Laterality: N/A;  . Left heart catheterization with coronary angiogram N/A 01/22/2013    Procedure: LEFT HEART CATHETERIZATION WITH CORONARY ANGIOGRAM;  Surgeon: Laverda Page, MD;  Location: [facility] CATH LAB;  Service: Cardiovascular;  Laterality: N/A;  . Left heart catheterization with coronary angiogram N/A 06/26/2013    Procedure: LEFT HEART CATHETERIZATION WITH CORONARY ANGIOGRAM;  Surgeon: Laverda Page, MD;  Location[facility] CATH LAB;  Service: Cardiovascular;  Laterality: N/A;  . Left heart catheterization with coronary angiogram N/A 11/05/2013    Procedure: LEFT HEART CATHETERIZATION WITH CORONARY ANGIOGRAM;  Surgeon: Laverda Page, MD;  Location: [facility] CATH LAB;  Service: Cardiovascular;  Laterality: N/A;    Prior to Admission medications   Medication Sig Start Date End Date Taking? Authorizing Provider  acetaminophen (TYLENOL) 500 MG tablet Take 2 tablets (1,000 mg total) by mouth every 6 (six) hours as needed. 06/04/14  Yes Susy Frizzle,  MD  ascorbic acid (VITAMIN C) 500 MG tablet Take 1 tablet (500 mg total) by mouth 2 (two) times daily. 06/04/14  Yes Susy Frizzle, MD  aspirin 81 MG chewable tablet Chew 2 tablets (162 mg total) by mouth daily. 06/04/14  Yes Susy Frizzle, MD  cetirizine (ZYRTEC) 10 MG tablet Take 1 tablet (10 mg total) by mouth at bedtime. 06/04/14  Yes Susy Frizzle, MD  feeding supplement, GLUCERNA SHAKE, (GLUCERNA SHAKE) LIQD Take 237 mLs by mouth 3 (three) times daily with meals. 12/13/13  Yes Donielle Liston Alba, PA-C  ferrous sulfate 325 (65 FE) MG tablet Take 1 tablet (325 mg total) by mouth 2 (two) times daily with a meal. 06/04/14  Yes Susy Frizzle, MD  fluticasone (FLONASE) 50 MCG/ACT nasal spray Place 1 spray into both nostrils daily. 06/04/14  Yes Susy Frizzle, MD  insulin aspart (NOVOLOG FLEXPEN) 100 UNIT/ML FlexPen 70-120=0    251-300=5 121-150=1  301-350=7 151-200=2  351-400=9 201-250=3  400 or >, call MD  units of insulin Patient taking differently: Inject 0-10 Units into the skin 3 (three) times daily with meals. 70-120=0    251-300=5 121-150=1  301-350=7 151-200=2  351-400=9 201-250=3  400 or >, call MD  units of insulin 06/04/14  Yes Susy Frizzle, MD  Insulin Glargine (LANTUS) 100 UNIT/ML Solostar Pen Inject 12 Units into the skin 2 (two) times daily. 06/06/14  Yes Susy Frizzle, MD  ipratropium-albuterol (DUONEB) 0.5-2.5 (3) MG/3ML SOLN Take 3 mLs by nebulization every 6 (six) hours as needed. 06/04/14  Yes Susy Frizzle, MD  Melatonin 3 MG TABS Take 1.5 mg by mouth at bedtime as needed (insomnia).    Yes Historical Provider, MD  metoprolol tartrate (LOPRESSOR) 25 MG tablet Take 1 tablet (25 mg total) by mouth 2 (two) times daily. 06/04/14  Yes Susy Frizzle, MD  nitrofurantoin, macrocrystal-monohydrate, (MACROBID) 100 MG capsule Take 1 capsule (100 mg total) by mouth 2 (two) times daily. 06/11/14  Yes Susy Frizzle, MD  nitroGLYCERIN (NITROSTAT) 0.4 MG SL tablet Place  1 tablet (0.4 mg total) under the tongue every 5 (five) minutes as needed for chest pain. 06/13/14  Yes Susy Frizzle, MD  ondansetron (ZOFRAN) 4 MG tablet Take 1 tablet (4 mg total) by mouth every 6 (six) hours as needed for nausea or vomiting. 06/04/14  Yes Susy Frizzle, MD  oxyCODONE (ROXICODONE) 5 MG immediate release tablet Take 0.5 tablets (2.5 mg total) by mouth every 4 (four) hours as needed for severe pain. 06/06/14  Yes Susy Frizzle, MD  pantoprazole (PROTONIX) 40 MG tablet Take 1 tablet (40 mg total) by mouth daily. 06/09/14  Yes Susy Frizzle, MD  polyethylene glycol [facility] / GLYCOLAX) packet Take 17 g by mouth every other day. 06/04/14  Yes Susy Frizzle, MD  sodium chloride 1 G tablet Take 1 g by mouth 2 (two) times daily.   Yes Historical Provider, MD  torsemide (DEMADEX) 20 MG tablet Take 1 tablet (20 mg total) by mouth 2 (two) times daily. 06/04/14  Yes Susy Frizzle, MD  traZODone (DESYREL) 50 MG tablet Take 1 tablet (50 mg total) by mouth at bedtime. 06/04/14  Yes Susy Frizzle, MD  ACCU-CHEK FASTCLIX LANCETS MISC Check BS QID - Fasting, before meals and QHS 06/04/14   Susy Frizzle, MD  B-D ULTRAFINE III SHORT PEN 31G X 8 MM MISC 5 each by Does not apply route 5 (five) times daily. 06/06/14   Susy Frizzle, MD  Blood Glucose Monitoring Suppl (ACCU-CHEK AVIVA) device Use as instructed 06/06/14 06/06/15  Susy Frizzle, MD  glucose blood (ACCU-CHEK SMARTVIEW) test strip Check BS QID - Dx: E11.9 06/04/14   Susy Frizzle, MD  sennosides-docusate sodium (SENOKOT-S) 8.6-50 MG tablet Take 1 tablet by mouth 2 (two) times daily. Patient not taking: Reported on 06/17/2014 06/04/14   Susy Frizzle, MD  simvastatin (ZOCOR) 20 MG tablet Take 1 tablet (20 mg total) by mouth daily. 06/09/14   Susy Frizzle, MD  UNABLE TO FIND 1 each by Does not apply route once. [place] BED 05/13/14   Susy Frizzle, MD  UNABLE TO FIND 1 each by Does not apply route once. Deliver  ONE BEDSIDE COMMODE 05/13/14   Susy Frizzle, MD  UNABLE TO FIND 1 each by Does not apply route once. Deliver ONE [place] WITH SEAT 05/13/14   Susy Frizzle, MD    Social History:  reports that she has never smoked. She has never used smokeless tobacco. She reports that she does not drink alcohol or use illicit drugs.  Family history- patient's father had heart problems, her sister has history of stroke  All  the positives are listed in BOLD  Review of Systems:  HEENT: Headache, blurred vision, runny nose, sore throat Neck: Hypothyroidism, hyperthyroidism,,lymphadenopathy Chest : Shortness of breath, history of COPD, Asthma Heart : Chest pain, history of coronary arterey disease GI:  Nausea, vomiting, diarrhea, constipation, GERD GU: Dysuria, urgency, frequency of urination, hematuria,  Neuro: Stroke, seizures, syncope Psych: Depression, anxiety, hallucinations   Physical Exam: Blood pressure 132/55, pulse 63, temperature 99.2 F (37.3 C), temperature source Rectal, resp. rate 24, height 5\' 7"  (1.702 m), weight 83.008 kg (183 lb), SpO2 95 %. Constitutional:   Patient is a well-developed and well-nourished female* in no acute distress and cooperative with exam. Head: Normocephalic and atraumatic Mouth: Mucus membranes moist Eyes: PERRL, EOMI, conjunctivae normal Neck: Supple, No Thyromegaly Cardiovascular: RRR, S1 normal, S2 normal Pulmonary/Chest: CTAB, no wheezes, rales, or rhonchi Abdominal: Soft. Non-tender, non-distended, bowel sounds are normal, no masses, organomegaly, or guarding present.  Neurological: A&O x3, Strength is normal and symmetric bilaterally, cranial nerve II-XII are grossly intact, no focal motor deficit, sensory intact to light touch bilaterally.  Extremities : No Cyanosis, Clubbing, bilateral 1+ pitting edema  Labs on Admission:  Basic Metabolic Panel:  Recent Labs Lab 06/17/14 1800  NA 130*  K 4.4  CL 94*  CO2 25  GLUCOSE 86  BUN 63*    CREATININE 2.29*  CALCIUM 9.1   Liver Function Tests:  Recent Labs Lab 06/17/14 1800  AST 50*  ALT 38*  ALKPHOS 540*  BILITOT 1.1  PROT 7.9  ALBUMIN 3.2*   No results for input(s): LIPASE, AMYLASE in the last 168 hours.  Recent Labs Lab 06/17/14 1800  AMMONIA 35*   CBC:  Recent Labs Lab 06/17/14 1800  WBC 6.5  NEUTROABS 4.3  HGB 10.8*  HCT 34.1*  MCV 89.5  PLT 212   Cardiac Enzymes:  Recent Labs Lab 06/17/14 1800  TROPONINI 0.04*    BNP (last 3 results) No results for input(s): BNP in the last 8760 hours.  ProBNP (last 3 results)  Recent Labs  12/29/13 0340 12/30/13 0255 02/23/14 1915  PROBNP 10783.0* 12014.0* 16625.0*    CBG: No results for input(s): GLUCAP in the last 168 hours.  Radiological Exams on Admission: Dg Chest 2 View  06/17/2014   CLINICAL DATA:  Confusion. "Kidney infection" .  EXAM: CHEST  2 VIEW  COMPARISON:  05/30/2014  FINDINGS: Lateral view degraded by patient arm position. Prior median sternotomy. Lateral view else degraded by artifact posteriorly.  Mitral valve repair. Chin overlies the right apex. Cardiomegaly accentuated by AP portable technique. Atherosclerosis in the transverse aorta. No definite pleural fluid. Diminished lung volumes. Suspect mild interstitial edema. Left mid lung scarring. Minimal bibasilar volume loss.  IMPRESSION: Cardiomegaly with diminished lung volumes. Suspect mild interstitial edema.  Left mid lung scarring with mild bibasilar volume loss and subsegmental atelectasis.   Electronically Signed   By: Abigail Miyamoto M.D.   On: 06/17/2014 18:47    EKG: Independently reviewed. Normal sinus rhythm, ST T changes in the lead 1 aVL V4 V5 and V6   Assessment/Plan Active Problems:   Elevated troponin   UTI (lower urinary tract infection)   Diabetes mellitus   Transaminitis     UTI Patient recently diagnosed with UTI started on Cipro as outpatient. Also patient on Macrobid. Will hold these medications and  continue with Rocephin. Urine culture has been obtained.  Elevated troponin Likely from demand ischemia Patient has mild elevation of troponin 0.04, no chest pain or shortness  of breath. Chest x-ray shows mild interstitial edema. Will check 2-D echocardiogram as well as BNP. Will cycle the cardiac enzymes.  CAD Patient has history of non-STEMI, status post CABG 2. Patient denies chest pain at this time, has mild elevation of troponin., Will continue with aspirin, nitroglycerin when necessary  Acute on chronic kidney disease stage IV Patient has acute on CK D stage IV, will give gentle IV hydration with normal saline at 75 mL per hour. Follow BMP in a.m.  Diabetes mellitus Patient's blood glucose is 86 in the ED, will hold Lantus at this time and initiate sliding scale insulin with NovoLog. Patient might need adjustment of Lantus at the time of discharge.  Transaminitis Patient has elevated liver enzymes as well as alkaline phosphatase. Will obtain abdominal ultrasound in a.m.  Hyponatremia Sodium was 130, likely from dehydration. Will obtain serum osmolality, and follow BMP in a.m.  DVT prophylaxis Lovenox  Code status:DO NOT RESUSCITATE  Family discussion: Admission, patients condition and plan of care including tests being ordered have been discussed with the patient and *her son at bedside  who indicate understanding and agree with the plan and Code Status.   Time Spent on Admission: 65 minutes  [facility] Pager: [phone] 06/17/2014, 8:37 PM  If 7PM-7AM, please contact night-coverage  [email or web address]  Password [email or web address]

## 2014-06-17 NOTE — ED Notes (Signed)
Patient up to bedside commode, tolerated well.

## 2014-06-17 NOTE — ED Notes (Signed)
Was treated by [facility] for UTI about 2 weeks ago.  C/o weakness and having confusion.  C/o intermitted nausea.  Treated with Cipro and 2nd antibiotics (Son is not sure of name.  Was told by Pt's PCP that she needed to be treated at the hospital.

## 2014-06-18 ENCOUNTER — Observation Stay (HOSPITAL_COMMUNITY): Payer: Commercial Managed Care - HMO

## 2014-06-18 ENCOUNTER — Ambulatory Visit: Payer: Commercial Managed Care - HMO | Admitting: Family Medicine

## 2014-06-18 DIAGNOSIS — M8580 Other specified disorders of bone density and structure, unspecified site: Secondary | ICD-10-CM | POA: Diagnosis present

## 2014-06-18 DIAGNOSIS — G9340 Encephalopathy, unspecified: Secondary | ICD-10-CM | POA: Diagnosis present

## 2014-06-18 DIAGNOSIS — Z952 Presence of prosthetic heart valve: Secondary | ICD-10-CM | POA: Diagnosis not present

## 2014-06-18 DIAGNOSIS — I5032 Chronic diastolic (congestive) heart failure: Secondary | ICD-10-CM | POA: Diagnosis present

## 2014-06-18 DIAGNOSIS — Z955 Presence of coronary angioplasty implant and graft: Secondary | ICD-10-CM | POA: Diagnosis not present

## 2014-06-18 DIAGNOSIS — N189 Chronic kidney disease, unspecified: Secondary | ICD-10-CM

## 2014-06-18 DIAGNOSIS — Z7982 Long term (current) use of aspirin: Secondary | ICD-10-CM | POA: Diagnosis not present

## 2014-06-18 DIAGNOSIS — N179 Acute kidney failure, unspecified: Secondary | ICD-10-CM | POA: Diagnosis present

## 2014-06-18 DIAGNOSIS — E222 Syndrome of inappropriate secretion of antidiuretic hormone: Secondary | ICD-10-CM | POA: Diagnosis present

## 2014-06-18 DIAGNOSIS — E861 Hypovolemia: Secondary | ICD-10-CM | POA: Diagnosis present

## 2014-06-18 DIAGNOSIS — N184 Chronic kidney disease, stage 4 (severe): Secondary | ICD-10-CM | POA: Diagnosis present

## 2014-06-18 DIAGNOSIS — E1122 Type 2 diabetes mellitus with diabetic chronic kidney disease: Secondary | ICD-10-CM

## 2014-06-18 DIAGNOSIS — G43909 Migraine, unspecified, not intractable, without status migrainosus: Secondary | ICD-10-CM | POA: Diagnosis present

## 2014-06-18 DIAGNOSIS — I129 Hypertensive chronic kidney disease with stage 1 through stage 4 chronic kidney disease, or unspecified chronic kidney disease: Secondary | ICD-10-CM | POA: Diagnosis present

## 2014-06-18 DIAGNOSIS — R410 Disorientation, unspecified: Secondary | ICD-10-CM | POA: Diagnosis present

## 2014-06-18 DIAGNOSIS — I248 Other forms of acute ischemic heart disease: Secondary | ICD-10-CM | POA: Diagnosis present

## 2014-06-18 DIAGNOSIS — K219 Gastro-esophageal reflux disease without esophagitis: Secondary | ICD-10-CM | POA: Diagnosis present

## 2014-06-18 DIAGNOSIS — E1121 Type 2 diabetes mellitus with diabetic nephropathy: Secondary | ICD-10-CM | POA: Diagnosis present

## 2014-06-18 DIAGNOSIS — I2510 Atherosclerotic heart disease of native coronary artery without angina pectoris: Secondary | ICD-10-CM | POA: Diagnosis present

## 2014-06-18 DIAGNOSIS — B961 Klebsiella pneumoniae [K. pneumoniae] as the cause of diseases classified elsewhere: Secondary | ICD-10-CM | POA: Diagnosis present

## 2014-06-18 DIAGNOSIS — N390 Urinary tract infection, site not specified: Principal | ICD-10-CM

## 2014-06-18 DIAGNOSIS — I509 Heart failure, unspecified: Secondary | ICD-10-CM | POA: Diagnosis not present

## 2014-06-18 DIAGNOSIS — R7989 Other specified abnormal findings of blood chemistry: Secondary | ICD-10-CM | POA: Diagnosis not present

## 2014-06-18 DIAGNOSIS — Z9049 Acquired absence of other specified parts of digestive tract: Secondary | ICD-10-CM | POA: Diagnosis present

## 2014-06-18 DIAGNOSIS — Z66 Do not resuscitate: Secondary | ICD-10-CM | POA: Diagnosis present

## 2014-06-18 DIAGNOSIS — I272 Other secondary pulmonary hypertension: Secondary | ICD-10-CM | POA: Diagnosis present

## 2014-06-18 DIAGNOSIS — R188 Other ascites: Secondary | ICD-10-CM | POA: Diagnosis not present

## 2014-06-18 DIAGNOSIS — Z8711 Personal history of peptic ulcer disease: Secondary | ICD-10-CM | POA: Diagnosis not present

## 2014-06-18 DIAGNOSIS — Z886 Allergy status to analgesic agent status: Secondary | ICD-10-CM | POA: Diagnosis not present

## 2014-06-18 DIAGNOSIS — E860 Dehydration: Secondary | ICD-10-CM | POA: Diagnosis present

## 2014-06-18 DIAGNOSIS — Z85828 Personal history of other malignant neoplasm of skin: Secondary | ICD-10-CM | POA: Diagnosis not present

## 2014-06-18 DIAGNOSIS — Z794 Long term (current) use of insulin: Secondary | ICD-10-CM | POA: Diagnosis not present

## 2014-06-18 DIAGNOSIS — Z951 Presence of aortocoronary bypass graft: Secondary | ICD-10-CM | POA: Diagnosis not present

## 2014-06-18 DIAGNOSIS — E780 Pure hypercholesterolemia: Secondary | ICD-10-CM | POA: Diagnosis present

## 2014-06-18 DIAGNOSIS — I252 Old myocardial infarction: Secondary | ICD-10-CM | POA: Diagnosis not present

## 2014-06-18 DIAGNOSIS — M1990 Unspecified osteoarthritis, unspecified site: Secondary | ICD-10-CM | POA: Diagnosis present

## 2014-06-18 LAB — GLUCOSE, CAPILLARY
GLUCOSE-CAPILLARY: 65 mg/dL — AB (ref 70–99)
GLUCOSE-CAPILLARY: 229 mg/dL — AB (ref 70–99)
Glucose-Capillary: 71 mg/dL (ref 70–99)
Glucose-Capillary: 84 mg/dL (ref 70–99)
Glucose-Capillary: 114 mg/dL — ABNORMAL HIGH (ref 70–99)
Glucose-Capillary: 369 mg/dL — ABNORMAL HIGH (ref 70–99)

## 2014-06-18 LAB — COMPREHENSIVE METABOLIC PANEL
ALK PHOS: 433 U/L — AB (ref 39–117)
ALT: 29 U/L (ref 0–35)
AST: 33 U/L (ref 0–37)
Albumin: 2.6 g/dL — ABNORMAL LOW (ref 3.5–5.2)
Anion gap: 6 (ref 5–15)
BUN: 62 mg/dL — AB (ref 6–23)
CALCIUM: 8.5 mg/dL (ref 8.4–10.5)
CHLORIDE: 99 mmol/L (ref 96–112)
CO2: 26 mmol/L (ref 19–32)
Creatinine, Ser: 2.05 mg/dL — ABNORMAL HIGH (ref 0.50–1.10)
GFR, EST AFRICAN AMERICAN: 25 mL/min — AB (ref >90)
GFR, EST NON AFRICAN AMERICAN: 22 mL/min — AB (ref >90)
GLUCOSE: 72 mg/dL (ref 70–99)
POTASSIUM: 3.9 mmol/L (ref 3.5–5.1)
SODIUM: 131 mmol/L — AB (ref 135–145)
TOTAL PROTEIN: 6.5 g/dL (ref 6.0–8.3)
Total Bilirubin: 0.9 mg/dL (ref 0.3–1.2)

## 2014-06-18 LAB — CBC
HCT: 31.2 % — ABNORMAL LOW (ref 36.0–46.0)
HEMOGLOBIN: 9.9 g/dL — AB (ref 12.0–15.0)
MCH: 28.1 pg (ref 26.0–34.0)
MCHC: 31.7 g/dL (ref 30.0–36.0)
MCV: 88.6 fL (ref 78.0–100.0)
Platelets: 185 10*3/uL (ref 150–400)
RBC: 3.52 MIL/uL — AB (ref 3.87–5.11)
RDW: 17.5 % — ABNORMAL HIGH (ref 11.5–15.5)
WBC: 5.2 10*3/uL (ref 4.0–10.5)

## 2014-06-18 LAB — TROPONIN I
TROPONIN I: 0.03 ng/mL (ref <0.031)
Troponin I: 0.03 ng/mL (ref <0.031)

## 2014-06-18 LAB — OSMOLALITY: Osmolality: 297 mOsm/kg (ref 275–300)

## 2014-06-18 MED ORDER — INSULIN ASPART 100 UNIT/ML ~~LOC~~ SOLN
5.0000 [IU] | Freq: Once | SUBCUTANEOUS | Status: AC
  Administered 2014-06-18: 5 [IU] via SUBCUTANEOUS

## 2014-06-18 MED ORDER — SODIUM CHLORIDE 0.9 % IV SOLN
250.0000 mg | Freq: Four times a day (QID) | INTRAVENOUS | Status: DC
  Administered 2014-06-18 – 2014-06-19 (×5): 250 mg via INTRAVENOUS
  Filled 2014-06-18 (×9): qty 250

## 2014-06-18 NOTE — Progress Notes (Signed)
MD paged to make aware of patients sugar of 229 this evening.  SSI was d/c'd when patient was hypoglycemic and NPO this morning.

## 2014-06-18 NOTE — Progress Notes (Signed)
Hypoglycemic Event  CBG: 65  Treatment: 15 GM carbohydrate snack  Symptoms: None  Follow-up CBG: Time:0806 CBG Result:84  Possible Reasons for Event: Inadequate meal intake -  NPO  Comments/MD notified:MD paged and made aware    Claudia [name]  Remember to initiate Hypoglycemia Order Set & complete

## 2014-06-18 NOTE — Evaluation (Signed)
Physical Therapy Treatment Patient Details Name: Claudia [name] MRN: [id number] DOB: 04-20-1931 Today's Date: 06/18/2014    History of Present Illness 81 year old female who  has a past medical history of GERD (gastroesophageal reflux disease); PUD (peptic ulcer disease); Anemia; Hypertension; S/P endoscopy (Aug 2011); S/P colonoscopy (Sept 2011); Coronary artery disease; Shortness of breath; Diabetes mellitus; Headache(784.0); Cancer; Arthritis; Osteopenia; Hypercholesterolemia; SIADH (syndrome of inappropriate ADH production); CHF (congestive heart failure); Myocardial infarction (2013); and Chronic renal failure, stage 3 (moderate).    PT Comments    Pt was seen for evaluation.  She is a very pleasant pt who states that she feels much weaker than normal due to recent illness.  She is found to be mildly deconditioned and transfers are much more labored than usual.  Her gait is stable with a walker but she does tire easily.  She had been receiving HHPT and HHOT and she would like to resume this at d/c.  Follow Up Recommendations  Home health PT     Equipment Recommendations  None recommended by PT    Recommendations for Other Services       Precautions / Restrictions Precautions Precautions: Fall Restrictions Weight Bearing Restrictions: No    Mobility  Bed Mobility Overal bed mobility: Modified Independent                Transfers Overall transfer level: Needs assistance Equipment used: Rolling walker (2 wheeled) Transfers: Sit to/from Stand Sit to Stand: Min guard         General transfer comment: has increased difficulty standing from a low rise commode  Ambulation/Gait Ambulation/Gait assistance: Supervision Ambulation Distance (Feet): 120 Feet Assistive device: Rolling walker (2 wheeled) Gait Pattern/deviations: Trunk flexed   Gait velocity interpretation: at or above normal speed for age/gender     Stairs            Wheelchair Mobility     Modified Rankin (Stroke Patients Only)       Balance Overall balance assessment: Needs assistance Sitting-balance support: No upper extremity supported;Feet supported Sitting balance-Leahy Scale: Good     Standing balance support: No upper extremity supported Standing balance-Leahy Scale: Fair                      Cognition Arousal/Alertness: Awake/alert Behavior During Therapy: WFL for tasks assessed/performed Overall Cognitive Status: Within Functional Limits for tasks assessed                      Exercises      General Comments        Pertinent Vitals/Pain Pain Assessment:  (premedicated)    Home Living Family/patient expects to be discharged to:: Private residence Living Arrangements: Children Available Help at Discharge: Family;Available 24 hours/day Type of Home: House Home Access: Stairs to enter Entrance Stairs-Rails: Right Home Layout: One level Home Equipment: Walker - 2 wheels;Cane - single point      Prior Function Level of Independence: Independent with assistive device(s)      Comments: uses a walker   PT Goals (current goals can now be found in the care plan section) Acute Rehab PT Goals Patient Stated Goal: return to functional independence PT Goal Formulation: With patient Time For Goal Achievement: 07/02/14 Potential to Achieve Goals: Good    Frequency  Min 3X/week    PT Plan      Co-evaluation             End of Session Equipment Utilized  During Treatment: Gait belt Activity Tolerance: Patient tolerated treatment well Patient left: in bed;with call bell/phone within reach;with bed alarm set     Time: 3729-0211 PT Time Calculation (min) (ACUTE ONLY): 50 min  Charges:                       G Codes:  Functional Assessment Tool Used: clinical judgement Functional Limitation: Mobility: Walking and moving around Mobility: Walking and Moving Around Current Status ([id number]): At least 20 percent but less than  40 percent impaired, limited or restricted Mobility: Walking and Moving Around Goal Status [phone]): At least 1 percent but less than 20 percent impaired, limited or restricted   Sable Feil 06/18/2014, 11:44 AM

## 2014-06-18 NOTE — Progress Notes (Signed)
UR completed 

## 2014-06-18 NOTE — Progress Notes (Signed)
  Echocardiogram 2D Echocardiogram has been performed.  Claudia [name] 06/18/2014, 9:39 AM

## 2014-06-18 NOTE — Progress Notes (Signed)
ANTIBIOTIC CONSULT NOTE - INITIAL  Pharmacy Consult for Primaxin Indication: ESBL infection, UTI  Allergies  Allergen Reactions  . Aspirin Other (See Comments)    High doses caused stomach bleeds   Patient Measurements: Height: 5\' 7"  (170.2 cm) Weight: 187 lb 9.6 oz (85.095 kg) IBW/kg (Calculated) : 61.6  Vital Signs: Temp: 97.4 F (36.3 C) (04/13 0555) Temp Source: Oral (04/13 0555) BP: 129/55 mmHg (04/13 0555) Pulse Rate: 63 (04/13 0555) Intake/Output from previous day: 04/12 0701 - 04/13 0700 In: 607.5 [I.V.:607.5] Out: 13 [Urine:13] Intake/Output from this shift:    Labs:  Recent Labs  06/17/14 1800 06/18/14 0228  WBC 6.5 5.2  HGB 10.8* 9.9*  PLT 212 185  CREATININE 2.29* 2.05*   Estimated Creatinine Clearance: 24.5 mL/min (by C-G formula based on Cr of 2.05). No results for input(s): VANCOTROUGH, VANCOPEAK, VANCORANDOM, GENTTROUGH, GENTPEAK, GENTRANDOM, TOBRATROUGH, TOBRAPEAK, TOBRARND, AMIKACINPEAK, AMIKACINTROU, AMIKACIN in the last 72 hours.   Microbiology: Recent Results (from the past 720 hour(s))  Urine culture     Status: None   Collection Time: 05/30/14 10:52 AM  Result Value Ref Range Status   Culture KLEBSIELLA OXYTOCA  Final   Colony Count >=100,000 COLONIES/ML  Final   Organism ID, Bacteria KLEBSIELLA OXYTOCA  Final    Comment: Confirmed Extended Spectrum Beta-Lactamase Producer (ESBL)       Susceptibility   Klebsiella oxytoca -  (no method available)    AMPICILLIN >=32 Resistant     AMOX/CLAVULANIC 16 Intermediate     AMPICILLIN/SULBACTAM >=32 Resistant     PIP/TAZO 64 Intermediate     IMIPENEM 0.5 Sensitive     CEFAZOLIN >=64 Resistant     CEFTRIAXONE >=64 Resistant     CEFTAZIDIME  Resistant     CEFEPIME >=64 Resistant     GENTAMICIN >=16 Resistant     TOBRAMYCIN >=16 Resistant     CIPROFLOXACIN 0.5 Sensitive     LEVOFLOXACIN 0.25 Sensitive     NITROFURANTOIN 64 Intermediate     TRIMETH/SULFA >=320 Resistant   Urine culture      Status: None   Collection Time: 06/06/14  4:10 PM  Result Value Ref Range Status   Culture KLEBSIELLA OXYTOCA  Final   Colony Count >=100,000 COLONIES/ML  Final   Organism ID, Bacteria KLEBSIELLA OXYTOCA  Final    Comment: Confirmed Extended Spectrum Beta-Lactamase Producer (ESBL)       Susceptibility   Klebsiella oxytoca -  (no method available)    AMPICILLIN >=32 Resistant     AMOX/CLAVULANIC 16 Intermediate     AMPICILLIN/SULBACTAM >=32 Resistant     PIP/TAZO 32 Intermediate     IMIPENEM <=0.25 Sensitive     CEFAZOLIN >=64 Resistant     CEFTRIAXONE >=64 Resistant     CEFTAZIDIME  Resistant     CEFEPIME  Resistant     GENTAMICIN >=16 Resistant     TOBRAMYCIN >=16 Resistant     CIPROFLOXACIN >=4 Resistant     LEVOFLOXACIN >=8 Resistant     NITROFURANTOIN 64 Intermediate     TRIMETH/SULFA >=320 Resistant     Medical History: Past Medical History  Diagnosis Date  . GERD (gastroesophageal reflux disease)   . PUD (peptic ulcer disease)   . Anemia   . Hypertension   . S/P endoscopy Aug 2011    3 superficial gastric ulcers, NSAID-induced  . S/P colonoscopy Sept 2011    left-sided diverticula, tubular adenoma  . Coronary artery disease   . Shortness of breath   .  Diabetes mellitus     insulin dependent  . Headache(784.0)     rare migraines  . Cancer     hx of skin cancer  . Arthritis   . Osteopenia   . Hypercholesterolemia   . SIADH (syndrome of inappropriate ADH production)   . CHF (congestive heart failure)   . Myocardial infarction 2013  . Chronic renal failure, stage 3 (moderate)    Anti-infectives    Start     Dose/Rate Route Frequency Ordered Stop   06/18/14 2000  cefTRIAXone (ROCEPHIN) 1 g in dextrose 5 % 50 mL IVPB - Premix  Status:  Discontinued     1 g 100 mL/hr over 30 Minutes Intravenous Every 24 hours 06/17/14 2055 06/18/14 1002   06/18/14 1200  imipenem-cilastatin (PRIMAXIN) 250 mg in sodium chloride 0.9 % 100 mL IVPB     250 mg 200 mL/hr over 30  Minutes Intravenous 4 times per day 06/18/14 1002     06/17/14 1930  cefTRIAXone (ROCEPHIN) 1 g in dextrose 5 % 50 mL IVPB     1 g 100 mL/hr over 30 Minutes Intravenous  Once 06/17/14 1924 06/17/14 2034     Assessment: 81yo female.  Pt has elevated SCr.   Acute encephalopathy/?due to UTI (lower urinary tract infection): - She's had 2 previous urine cultures that show Klebsiella oxytoca which is only sensitive to imipenem. - Afebrile, no leukocytosis.  Goal of Therapy:  Eradicate infection.  Plan:  Continue Primaxin 250mg  IV q6hrs (dose appropriate for Wt and renal fxn) Monitor labs, renal fxn, progress, and cultures  Claudia [name], Claudia [name] 06/18/2014,10:28 AM

## 2014-06-18 NOTE — Care Management Note (Addendum)
    Page 1 of 1   06/20/2014     9:24:18 AM CARE MANAGEMENT NOTE 06/20/2014  Patient:  Claudia [name], Claudia [name]   Account Number:  [id number]  Date Initiated:  06/18/2014  Documentation initiated by:  Jolene Provost  Subjective/Objective Assessment:   Pt admitted for UTI. Pt is from home, lives with son and grandaughter. Pt is active with [facility] for PT services. Pt has a walker. Pt plans to return home at discharge with resumption of [facility] services.     Action/Plan:   Sheppard Evens at [facility] made aware of admission, will cont to update. Pt requested information on area foodbanks. Information provided. Will cont to follow for CM needs.   Anticipated DC Date:  06/21/2014   Anticipated DC Plan:  Rule  CM consult      [facility] Choice  Resumption Of Svcs/PTA Mavryk Pino   Choice offered to / List presented to:             Status of service:  Completed, signed off Medicare Important Message given?  YES (If response is "NO", the following Medicare IM given date fields will be blank) Date Medicare IM given:  06/20/2014 Medicare IM given by:  Jolene Provost Date Additional Medicare IM given:   Additional Medicare IM given by:    Discharge Disposition:  [place]  Per UR Regulation:  Reviewed for med. necessity/level of care/duration of stay  If discussed at [place] of Stay Meetings, dates discussed:    Comments:  06/20/2014 0900 Jolene Provost, RN, MSN, CM Pt discharing home today with resumption of [facility] services. [place], notified of resumption of services and will obtain pt information from chart. No further CM needs. 06/18/2014 [place], RN, MSN, CM

## 2014-06-18 NOTE — Progress Notes (Addendum)
[facility] PROGRESS NOTE  Assessment/Plan: Acute encephalopathy/?due to UTI (lower urinary tract infection): - She's had 2 previous urine cultures that show Klebsiella oxytoca which is only sensitive to imipenem. - Afebrile, no leukocytosis. - Get PT OT.  Hyponatremia: - Seems to be hypovolemic hyponatremia, her baseline creatinine is a lot 1.5-1.7. - Continue IV hydration.  Acute on chronic kidney disease stage III/diabetic nephropathy: - Unclear etiology seems to be hypovolemia, she was started on IV fluids and her creatinine has slowly improved. - There is no relevance at this time in getting urinary sodium urinary creatinine. I will continue her IV hydration continue monitor her creatinine.  Elevated troponin: - Resolved with hydration she continues to be asymptomatic.  DM (diabetes mellitus), type 2: - Seems to be controlled, pressure was borderline low in the morning. We'll DC all insulins. Continue check CBGs.    Code Status: DNR Family Communication: son at bedside who indicate understanding and agree with the plan and Code Status. Disposition Plan: 3-4 days  Consultants:  none  Procedures:  [name] abd  Antibiotics:  Rocephin 4.12.2016-4.13.2016  Imipenem 4.13.2016  HPI/Subjective: She will she has some suprapubic tenderness, she still feels weak.  Objective: Filed Vitals:   06/17/14 1930 06/17/14 2000 06/17/14 2109 06/18/14 0555  BP: 150/52 145/59 153/55 129/55  Pulse: 66 63 66 63  Temp:   98.2 F (36.8 C) 97.4 F (36.3 C)  TempSrc:   Oral Oral  Resp: 15 12 20 17   Height:      Weight:   85.095 kg (187 lb 9.6 oz)   SpO2: 99% 100% 100% 96%    Intake/Output Summary (Last 24 hours) at 06/18/14 0934 Last data filed at 06/18/14 4098  Gross per 24 hour  Intake  607.5 ml  Output     13 ml  Net  594.5 ml   Filed Weights   06/17/14 1718 06/17/14 2109  Weight: 83.008 kg (183 lb) 85.095 kg (187 lb 9.6 oz)    Exam:  General: Alert, awake,  oriented x3, in no acute distress.  HEENT: No bruits, no goiter.  Heart: Regular rate and rhythm. Lungs: Good air movement, clear Abdomen: Soft, suprapubic tenderness, nondistended, positive bowel sounds.  Neuro: Grossly intact, nonfocal.   Data Reviewed: Basic Metabolic Panel:  Recent Labs Lab 06/17/14 1800 06/18/14 0228  NA 130* 131*  K 4.4 3.9  CL 94* 99  CO2 25 26  GLUCOSE 86 72  BUN 63* 62*  CREATININE 2.29* 2.05*  CALCIUM 9.1 8.5   Liver Function Tests:  Recent Labs Lab 06/17/14 1800 06/18/14 0228  AST 50* 33  ALT 38* 29  ALKPHOS 540* 433*  BILITOT 1.1 0.9  PROT 7.9 6.5  ALBUMIN 3.2* 2.6*   No results for input(s): LIPASE, AMYLASE in the last 168 hours.  Recent Labs Lab 06/17/14 1800  AMMONIA 35*   CBC:  Recent Labs Lab 06/17/14 1800 06/18/14 0228  WBC 6.5 5.2  NEUTROABS 4.3  --   HGB 10.8* 9.9*  HCT 34.1* 31.2*  MCV 89.5 88.6  PLT 212 185   Cardiac Enzymes:  Recent Labs Lab 06/17/14 1800 06/17/14 2115 06/18/14 0228  TROPONINI 0.04* 0.03 0.03   BNP (last 3 results)  Recent Labs  06/17/14 1800  BNP 888.0*    ProBNP (last 3 results)  Recent Labs  12/29/13 0340 12/30/13 0255 02/23/14 1915  PROBNP 10783.0* 12014.0* 16625.0*    CBG:  Recent Labs Lab 06/17/14 2129 06/18/14 0249 06/18/14 1191 06/18/14 4782  GLUCAP 83 71 65* 84    No results found for this or any previous visit (from the past 240 hour(s)).   Studies: Dg Chest 2 View  06/17/2014   CLINICAL DATA:  Confusion. "Kidney infection" .  EXAM: CHEST  2 VIEW  COMPARISON:  05/30/2014  FINDINGS: Lateral view degraded by patient arm position. Prior median sternotomy. Lateral view else degraded by artifact posteriorly.  Mitral valve repair. Chin overlies the right apex. Cardiomegaly accentuated by AP portable technique. Atherosclerosis in the transverse aorta. No definite pleural fluid. Diminished lung volumes. Suspect mild interstitial edema. Left mid lung scarring.  Minimal bibasilar volume loss.  IMPRESSION: Cardiomegaly with diminished lung volumes. Suspect mild interstitial edema.  Left mid lung scarring with mild bibasilar volume loss and subsegmental atelectasis.   Electronically Signed   By: Abigail Miyamoto M.D.   On: 06/17/2014 18:47   US Abdomen Complete  06/18/2014   CLINICAL DATA:  Elevated LFTs.  EXAM: ULTRASOUND ABDOMEN COMPLETE  COMPARISON:  CT 10/14/2009  FINDINGS: Gallbladder: Prior cholecystectomy  Common bile duct: Diameter: Normal caliber, 6 mm  Liver: No focal lesion identified. Within normal limits in parenchymal echogenicity.  IVC: No abnormality visualized.  Pancreas: Visualized portion unremarkable.  Spleen: Size and appearance within normal limits.  Right Kidney: Length: 12.3 cm. Multiple small complex cysts, the largest 1.7 cm in the lower pole with internal septation. These likely represent benign complex cysts. No hydronephrosis.  Left Kidney: Length: 11.8 cm. 2.4 cm simple appearing lower pole cyst. No hydronephrosis.  Abdominal aorta: Atherosclerotic plaque and irregularity of the wall. No aneurysm.  Other findings: Mild ascites adjacent to the liver. Small right pleural effusion.  IMPRESSION: No acute findings.  Mild ascites, small right pleural effusion.   Electronically Signed   By: Rolm Baptise M.D.   On: 06/18/2014 08:19    Scheduled Meds: . sodium chloride   Intravenous STAT  . aspirin  162 mg Oral Daily  . cefTRIAXone (ROCEPHIN)  IV  1 g Intravenous Q24H  . enoxaparin (LOVENOX) injection  30 mg Subcutaneous Q24H  . feeding supplement (GLUCERNA SHAKE)  237 mL Oral TID WC  . insulin aspart  0-9 Units Subcutaneous TID WC  . metoprolol tartrate  25 mg Oral BID  . pantoprazole  40 mg Oral Daily  . [START ON 06/19/2014] polyethylene glycol  17 g Oral QODAY  . simvastatin  20 mg Oral q1800  . traZODone  50 mg Oral QHS   Continuous Infusions:    Charlynne Cousins  [facility] Pager [phone]. If 7PM-7AM, please contact  night-coverage at [email or web address], password [facility] 06/18/2014, 9:34 AM

## 2014-06-19 ENCOUNTER — Telehealth: Payer: Self-pay | Admitting: *Deleted

## 2014-06-19 ENCOUNTER — Ambulatory Visit: Payer: Commercial Managed Care - HMO | Admitting: Family Medicine

## 2014-06-19 DIAGNOSIS — R7989 Other specified abnormal findings of blood chemistry: Secondary | ICD-10-CM

## 2014-06-19 LAB — HEMOGLOBIN A1C
HEMOGLOBIN A1C: 7.2 % — AB (ref 4.8–5.6)
MEAN PLASMA GLUCOSE: 160 mg/dL

## 2014-06-19 LAB — GLUCOSE, CAPILLARY
GLUCOSE-CAPILLARY: 270 mg/dL — AB (ref 70–99)
GLUCOSE-CAPILLARY: 235 mg/dL — AB (ref 70–99)
Glucose-Capillary: 252 mg/dL — ABNORMAL HIGH (ref 70–99)
Glucose-Capillary: 235 mg/dL — ABNORMAL HIGH (ref 70–99)

## 2014-06-19 LAB — BASIC METABOLIC PANEL
ANION GAP: 10 (ref 5–15)
BUN: 51 mg/dL — ABNORMAL HIGH (ref 6–23)
CALCIUM: 8.5 mg/dL (ref 8.4–10.5)
CO2: 24 mmol/L (ref 19–32)
Chloride: 97 mmol/L (ref 96–112)
Creatinine, Ser: 1.99 mg/dL — ABNORMAL HIGH (ref 0.50–1.10)
GFR calc non Af Amer: 23 mL/min — ABNORMAL LOW (ref >90)
GFR, EST AFRICAN AMERICAN: 26 mL/min — AB (ref >90)
Glucose, Bld: 295 mg/dL — ABNORMAL HIGH (ref 70–99)
Potassium: 4 mmol/L (ref 3.5–5.1)
SODIUM: 131 mmol/L — AB (ref 135–145)

## 2014-06-19 MED ORDER — FOSFOMYCIN TROMETHAMINE 3 G PO PACK
3.0000 g | PACK | ORAL | Status: DC
  Administered 2014-06-19: 3 g via ORAL
  Filled 2014-06-19 (×2): qty 3

## 2014-06-19 MED ORDER — INSULIN GLARGINE 100 UNIT/ML ~~LOC~~ SOLN
5.0000 [IU] | Freq: Two times a day (BID) | SUBCUTANEOUS | Status: DC
  Administered 2014-06-19 – 2014-06-20 (×3): 5 [IU] via SUBCUTANEOUS
  Filled 2014-06-19 (×9): qty 0.05

## 2014-06-19 MED ORDER — INSULIN ASPART 100 UNIT/ML ~~LOC~~ SOLN
0.0000 [IU] | Freq: Every day | SUBCUTANEOUS | Status: DC
  Administered 2014-06-19: 2 [IU] via SUBCUTANEOUS

## 2014-06-19 MED ORDER — INSULIN ASPART 100 UNIT/ML ~~LOC~~ SOLN
0.0000 [IU] | Freq: Three times a day (TID) | SUBCUTANEOUS | Status: DC
  Administered 2014-06-19 (×2): 5 [IU] via SUBCUTANEOUS
  Administered 2014-06-19: 3 [IU] via SUBCUTANEOUS

## 2014-06-19 MED ORDER — INSULIN ASPART 100 UNIT/ML ~~LOC~~ SOLN
3.0000 [IU] | Freq: Three times a day (TID) | SUBCUTANEOUS | Status: DC
  Administered 2014-06-19 (×3): 3 [IU] via SUBCUTANEOUS

## 2014-06-19 NOTE — Telephone Encounter (Signed)
Received fax from [facility] care mgmt Dept stating that pt has been admitted to the acute care hospital.  Hospital: [facility]  Admit Date: 06/17/14  Dx: R41.0- Disorientation, unspecified  Admitting physician: Eleonore Chiquito, MD  Pending authorization:[id number]

## 2014-06-19 NOTE — Progress Notes (Signed)
PT Cancellation Note  Patient Details Name: REESE SENK MRN: [id number] DOB: 03/07/1932   Cancelled Treatment:    Reason Eval/Treat Not Completed: Fatigue/lethargy limiting ability to participate.  Pt would awaken momentarily but then immediately fall back to sleep.  RN states that she has only had pain med this AM.  No sedatives.   Demetrios Isaacs L 06/19/2014, 11:42 AM

## 2014-06-19 NOTE — Progress Notes (Signed)
Physical Therapy Treatment Patient Details Name: Claudia [name] MRN: [id number] DOB: 1932/02/06 Today's Date: 06/19/2014    History of Present Illness 81 year old female who  has a past medical history of GERD (gastroesophageal reflux disease); PUD (peptic ulcer disease); Anemia; Hypertension; S/P endoscopy (Aug 2011); S/P colonoscopy (Sept 2011); Coronary artery disease; Shortness of breath; Diabetes mellitus; Headache(784.0); Cancer; Arthritis; Osteopenia; Hypercholesterolemia; SIADH (syndrome of inappropriate ADH production); CHF (congestive heart failure); Myocardial infarction (2013); and Chronic renal failure, stage 3 (moderate).    PT Comments    Pt more arousable this afternoon.  She continues to have difficulty with rising from a low seat height so this was focused on during tx.  She was instructed in gait with a walker but had decreased overall endurance.  She was only able to participate in 2 seated exercises.  I am not sure why her endurance is decreased from yesterday but will keep a close eye on her to ensure her ability to go home at d/c.  Follow Up Recommendations  Home health PT     Equipment Recommendations  None recommended by PT    Recommendations for Other Services  none     Precautions / Restrictions Precautions Precautions: Fall Restrictions Weight Bearing Restrictions: No    Mobility  Bed Mobility Overal bed mobility: Modified Independent                Transfers Overall transfer level: Needs assistance Equipment used: Rolling walker (2 wheeled) Transfers: Sit to/from Stand Sit to Stand: Min guard         General transfer comment: min assist rising from commode  Ambulation/Gait Ambulation/Gait assistance: Supervision Ambulation Distance (Feet): 90 Feet Assistive device: Rolling walker (2 wheeled) Gait Pattern/deviations: Trunk flexed   Gait velocity interpretation: Below normal speed for age/gender     Stairs             Wheelchair Mobility    Modified Rankin (Stroke Patients Only)       Balance Overall balance assessment: Needs assistance   Sitting balance-Leahy Scale: Good     Standing balance support: No upper extremity supported Standing balance-Leahy Scale: Fair                      Cognition Arousal/Alertness: Awake/alert Behavior During Therapy: WFL for tasks assessed/performed Overall Cognitive Status: Within Functional Limits for tasks assessed                      Exercises General Exercises - Lower Extremity Long Arc Quad: AROM;Both;10 reps;Seated Hip Flexion/Marching: AROM;Both;10 reps;Seated    General Comments        Pertinent Vitals/Pain Pain Assessment: No/denies pain    Home Living Family/patient expects to be discharged to:: Private residence                    Prior Function            PT Goals (current goals can now be found in the care plan section) Progress towards PT goals: Progressing toward goals    Frequency  Min 3X/week    PT Plan Current plan remains appropriate    Co-evaluation             End of Session Equipment Utilized During Treatment: Gait belt Activity Tolerance: Patient limited by fatigue Patient left: in chair;with call bell/phone within reach     Time: 1339-1413 PT Time Calculation (min) (ACUTE ONLY): 34 min  Charges:  $Gait  Training: 8-22 mins $Therapeutic Activity: 8-22 mins                    G Codes:      Sable Feil 07-17-14, 2:18 PM

## 2014-06-19 NOTE — Progress Notes (Signed)
[facility] PROGRESS NOTE  Assessment/Plan: Acute encephalopathy/?due to UTI (lower urinary tract infection): - She's had 2 previous urine cultures that show Klebsiella oxytoca which is only sensitive to imipenem. Insert PICC line on 4.15.2016. - Afebrile, no leukocytosis. Urine cultures pending. - PT rec home health.  Hyponatremia: - Seems to be hypovolemic hyponatremia, basic metabolic panel pending. - KVO her IV fluids.  Acute on chronic kidney disease stage III/diabetic nephropathy: - Unclear etiology seems to be hypovolemia, she was started on IV fluids and her creatinine has slowly improved.  Elevated troponin: - Resolved with hydration she continues to be asymptomatic. - Echocardiogram shows normal motion abnormality.  DM (diabetes mellitus), type 2: - Seems to be controlled, pressure was borderline low in the morning. We'll DC all insulins. Continue check CBGs.   Chronic diastolic heart failure: - Stable, seems to be euvolemic. KVO IV fluids.  History of aortic bioprosthetic valve replacement:  Mild pulmonary hypertension by echocardiogram: Follow-up with cardiology is no patient.  Elevated LFTs: -  Showed no acute findings, mostly due to UTI.   Code Status: DNR Family Communication: son at bedside who indicate understanding and agree with the plan and Code Status. Disposition Plan:1 days  Consultants:  none  Procedures: [name] abd: No acute findings.  Mild ascites, small right pleural effusion.    Antibiotics:  Rocephin 4.12.2016-4.13.2016  Imipenem 4.13.2016  HPI/Subjective: Suprapubic tenderness improved, she still feels weak.  Objective: Filed Vitals:   06/18/14 1444 06/18/14 2015 06/18/14 2146 06/19/14 0539  BP: 135/51  162/54 145/54  Pulse: 57  58 66  Temp: 98.5 F (36.9 C)  98.7 F (37.1 C) 98.8 F (37.1 C)  TempSrc: Oral  Oral Oral  Resp: 18  20 18   Height:      Weight:      SpO2: 97% 95% 96% 94%    Intake/Output  Summary (Last 24 hours) at 06/19/14 0835 Last data filed at 06/19/14 0600  Gross per 24 hour  Intake    780 ml  Output      0 ml  Net    780 ml   Filed Weights   06/17/14 1718 06/17/14 2109  Weight: 83.008 kg (183 lb) 85.095 kg (187 lb 9.6 oz)    Exam:  General: Alert, awake, oriented x3, in no acute distress.  HEENT: No bruits, no goiter.  Heart: Regular rate and rhythm. Lungs: Good air movement, clear Abdomen: Soft, suprapubic tenderness, nondistended, positive bowel sounds.  Neuro: Grossly intact, nonfocal.   Data Reviewed: Basic Metabolic Panel:  Recent Labs Lab 06/17/14 1800 06/18/14 0228  NA 130* 131*  K 4.4 3.9  CL 94* 99  CO2 25 26  GLUCOSE 86 72  BUN 63* 62*  CREATININE 2.29* 2.05*  CALCIUM 9.1 8.5   Liver Function Tests:  Recent Labs Lab 06/17/14 1800 06/18/14 0228  AST 50* 33  ALT 38* 29  ALKPHOS 540* 433*  BILITOT 1.1 0.9  PROT 7.9 6.5  ALBUMIN 3.2* 2.6*   No results for input(s): LIPASE, AMYLASE in the last 168 hours.  Recent Labs Lab 06/17/14 1800  AMMONIA 35*   CBC:  Recent Labs Lab 06/17/14 1800 06/18/14 0228  WBC 6.5 5.2  NEUTROABS 4.3  --   HGB 10.8* 9.9*  HCT 34.1* 31.2*  MCV 89.5 88.6  PLT 212 185   Cardiac Enzymes:  Recent Labs Lab 06/17/14 1800 06/17/14 2115 06/18/14 0228 06/18/14 0850  TROPONINI 0.04* 0.03 0.03 0.03   BNP (last 3 results)  Recent Labs  06/17/14 1800  BNP 888.0*    ProBNP (last 3 results)  Recent Labs  12/29/13 0340 12/30/13 0255 02/23/14 1915  PROBNP 10783.0* 12014.0* 16625.0*    CBG:  Recent Labs Lab 06/18/14 0806 06/18/14 1101 06/18/14 1606 06/18/14 2144 06/19/14 0716  GLUCAP 84 114* 229* 369* 252*    Recent Results (from the past 240 hour(s))  Urine culture     Status: None (Preliminary result)   Collection Time: 06/17/14  6:01 PM  Result Value Ref Range Status   Specimen Description URINE, CATHETERIZED  Final   Special Requests NONE  Final   Colony Count    Final    >=100,000 COLONIES/ML Performed at [place] Performed at [facility]    Report Status PENDING  Incomplete     Studies: Dg Chest 2 View  06/17/2014   CLINICAL DATA:  Confusion. "Kidney infection" .  EXAM: CHEST  2 VIEW  COMPARISON:  05/30/2014  FINDINGS: Lateral view degraded by patient arm position. Prior median sternotomy. Lateral view else degraded by artifact posteriorly.  Mitral valve repair. Chin overlies the right apex. Cardiomegaly accentuated by AP portable technique. Atherosclerosis in the transverse aorta. No definite pleural fluid. Diminished lung volumes. Suspect mild interstitial edema. Left mid lung scarring. Minimal bibasilar volume loss.  IMPRESSION: Cardiomegaly with diminished lung volumes. Suspect mild interstitial edema.  Left mid lung scarring with mild bibasilar volume loss and subsegmental atelectasis.   Electronically Signed   By: Abigail Miyamoto M.D.   On: 06/17/2014 18:47   US Abdomen Complete  06/18/2014   CLINICAL DATA:  Elevated LFTs.  EXAM: ULTRASOUND ABDOMEN COMPLETE  COMPARISON:  CT 10/14/2009  FINDINGS: Gallbladder: Prior cholecystectomy  Common bile duct: Diameter: Normal caliber, 6 mm  Liver: No focal lesion identified. Within normal limits in parenchymal echogenicity.  IVC: No abnormality visualized.  Pancreas: Visualized portion unremarkable.  Spleen: Size and appearance within normal limits.  Right Kidney: Length: 12.3 cm. Multiple small complex cysts, the largest 1.7 cm in the lower pole with internal septation. These likely represent benign complex cysts. No hydronephrosis.  Left Kidney: Length: 11.8 cm. 2.4 cm simple appearing lower pole cyst. No hydronephrosis.  Abdominal aorta: Atherosclerotic plaque and irregularity of the wall. No aneurysm.  Other findings: Mild ascites adjacent to the liver. Small right pleural effusion.  IMPRESSION: No acute findings.  Mild ascites, small right pleural  effusion.   Electronically Signed   By: Rolm Baptise M.D.   On: 06/18/2014 08:19    Scheduled Meds: . aspirin  162 mg Oral Daily  . enoxaparin (LOVENOX) injection  30 mg Subcutaneous Q24H  . feeding supplement (GLUCERNA SHAKE)  237 mL Oral TID WC  . imipenem-cilastatin  250 mg Intravenous 4 times per day  . insulin aspart  0-5 Units Subcutaneous QHS  . insulin aspart  0-9 Units Subcutaneous TID WC  . insulin aspart  3 Units Subcutaneous TID WC  . metoprolol tartrate  25 mg Oral BID  . pantoprazole  40 mg Oral Daily  . polyethylene glycol  17 g Oral QODAY  . simvastatin  20 mg Oral q1800  . traZODone  50 mg Oral QHS   Continuous Infusions:    Charlynne Cousins  [facility] Pager [phone]. If 7PM-7AM, please contact night-coverage at [email or web address], password [facility] 06/19/2014, 8:35 AM  LOS: 1 day

## 2014-06-19 NOTE — Progress Notes (Signed)
Inpatient Diabetes Program Recommendations  AACE/ADA: New Consensus Statement on Inpatient Glycemic Control (2013)  Target Ranges:  Prepandial:   less than 140 mg/dL      Peak postprandial:   less than 180 mg/dL (1-2 hours)      Critically ill patients:  140 - 180 mg/dL   Results for Claudia [name], Claudia [name] (MRN [id number]) as of 06/19/2014 08:20  Ref. Range 06/18/2014 07:18 06/18/2014 08:06 06/18/2014 11:01 06/18/2014 16:06 06/18/2014 21:44 06/19/2014 07:16  Glucose-Capillary Latest Ref Range: 70-99 mg/dL 65 (L) 84 114 (H) 229 (H) 369 (H) 252 (H)   Diabetes history: DM2 Outpatient Diabetes medications: Lantus 12 units BID, Novolog 0-10 units TID with meals Current orders for Inpatient glycemic control: Novolog 0-9 units TID with meals, Novolog 0-5 units HS, Novolog 3 units TID with meals for meal coverage  Inpatient Diabetes Program Recommendations Insulin - Basal: Hypoglycemia has resolved and CBGs now stable with fasting glucose of 252 mg/dl this morning. Please consider ordering low dose basal insulin. Recommend starting with Lantus 8 units daily starting now. Diet: Currently ordered Regular diet. Please consider changing diet to Carb Modified diet.  Thanks, Barnie Alderman, RN, MSN, CCRN, CDE Diabetes Coordinator Inpatient Diabetes Program [phone] (Team Pager from [place] to [place]) [phone] (AP office) [phone] [facility] office)

## 2014-06-20 DIAGNOSIS — Z1612 Extended spectrum beta lactamase (ESBL) resistance: Secondary | ICD-10-CM

## 2014-06-20 DIAGNOSIS — A498 Other bacterial infections of unspecified site: Secondary | ICD-10-CM

## 2014-06-20 LAB — BASIC METABOLIC PANEL
Anion gap: 8 (ref 5–15)
BUN: 53 mg/dL — AB (ref 6–23)
CALCIUM: 8.3 mg/dL — AB (ref 8.4–10.5)
CO2: 25 mmol/L (ref 19–32)
Chloride: 98 mmol/L (ref 96–112)
Creatinine, Ser: 1.95 mg/dL — ABNORMAL HIGH (ref 0.50–1.10)
GFR, EST AFRICAN AMERICAN: 27 mL/min — AB (ref >90)
GFR, EST NON AFRICAN AMERICAN: 23 mL/min — AB (ref >90)
Glucose, Bld: 182 mg/dL — ABNORMAL HIGH (ref 70–99)
POTASSIUM: 4.5 mmol/L (ref 3.5–5.1)
Sodium: 131 mmol/L — ABNORMAL LOW (ref 135–145)

## 2014-06-20 LAB — GLUCOSE, CAPILLARY
Glucose-Capillary: 179 mg/dL — ABNORMAL HIGH (ref 70–99)
Glucose-Capillary: 222 mg/dL — ABNORMAL HIGH (ref 70–99)

## 2014-06-20 MED ORDER — FOSFOMYCIN TROMETHAMINE 3 G PO PACK: 3.0000 g | PACK | ORAL | Status: DC

## 2014-06-20 NOTE — Progress Notes (Signed)
Patient discharged with instructions, prescription, and care notes.  Verbalized understanding via teach back.  IV was removed and the site was WNL. Patient voiced no further complaints or concerns at the time of discharge.  Appointments scheduled per instructions.  Patient left the floor via w/c with staff and family in stable condition. 

## 2014-06-20 NOTE — Discharge Summary (Addendum)
Physician Discharge Summary  Claudia [name] KZS:[id number] DOB: 06-08-1931 DOA: 06/17/2014  PCP: Odette Fraction, MD  Admit date: 06/17/2014 Discharge date: 06/20/2014  Time spent: 35 minutes  Recommendations for Outpatient Follow-up:  1. Follow-up with PCP in 2 weeks.  Discharge Diagnoses:  Principal Problem:   Infection with ESBL Klebsiella oxytoca Active Problems:   Acute encephalopathy   DM (diabetes mellitus), type 2   CKD (chronic kidney disease) stage 4, GFR 15-29 ml/min   Elevated troponin   UTI (lower urinary tract infection)   Discharge Condition: Stable  Diet recommendation: heart healthy  Filed Weights   06/17/14 1718 06/17/14 2109  Weight: 83.008 kg (183 lb) 85.095 kg (187 lb 9.6 oz)    History of present illness:  81 year old female who  has a past medical history of GERD (gastroesophageal reflux disease); PUD (peptic ulcer disease); Anemia; Hypertension; S/P endoscopy (Aug 2011); S/P colonoscopy (Sept 2011); Coronary artery disease; Shortness of breath; Diabetes mellitus; Headache(784.0); Cancer; Arthritis; Osteopenia; Hypercholesterolemia; SIADH (syndrome of inappropriate ADH production); CHF (congestive heart failure); Myocardial infarction (2013); and Chronic renal failure, stage 3 (moderate). Today was brought to the hospital by patient's son for worsening lethargy and weakness. As per patient's son she was diagnosed with UTI last week and started on Cipro, the antibiotics were changed by PCP as per patient's son as Cipro did not work. Patient continues to feel generalized weakness, lethargy. She denies dysuria but complains of taking long time to empty the bladder. Patient has been having episodic memory loss and intermittently not responding during the day. As per patient's son this has been going on for a while. She does not have a history of stroke or seizures in the past. Patient denies any chest pain or shortness of breath no nausea vomiting or  diarrhea.  Hospital Course:  Acute encephalopathy/?due to UTI (lower urinary tract infection) with ESBL Klebsiella oxytoca: - She's had 2 previous urine cultures that show Klebsiella oxytoca which is  sensitive to imipenem and fosfomycin - Afebrile, no leukocytosis.  - she will cont Fosfomycin for 3 additional days. - PT rec home health.  Hyponatremia: - Seems to be hypovolemic hyponatremia, basic metabolic panel showed improved. - Treated with IV hydration.  Acute on chronic kidney disease stage III/diabetic nephropathy: - Unclear etiology seems to be hypovolemia, she was started on IV fluids and her creatinine has slowly improved. - baseline Creatinine 1.5-1.9  Elevated troponin: - Resolved with hydration she continues to be asymptomatic. - Echocardiogram shows normal motion abnormality.  DM (diabetes mellitus), type 2: - no changes were made to her regimen.  Chronic diastolic heart failure: - Stable, seems to be euvolemic. KVO IV fluids.  History of aortic bioprosthetic valve replacement:  Mild pulmonary hypertension by echocardiogram: Follow-up with cardiology is no patient.  Elevated LFTs: - Showed no acute findings, mostly due to UTI.  Procedures: Abdominal ultrasound 2-D echo Chest x-ray Consultations:  none  Discharge Exam: Filed Vitals:   06/20/14 0535  BP: 119/49  Pulse: 60  Temp: 98 F (36.7 C)  Resp: 20    General: A&O x3 Cardiovascular: RRR Respiratory: good air movement CTA B/L  Discharge Instructions   Discharge Instructions    Diet - low sodium heart healthy    Complete by:  As directed      Increase activity slowly    Complete by:  As directed           Current Discharge Medication List    START taking these medications  Details  fosfomycin (MONUROL) 3 G PACK Take 3 g by mouth every 3 (three) days. Qty: 3 g, Refills: 0      CONTINUE these medications which have NOT CHANGED   Details  acetaminophen (TYLENOL) 500 MG  tablet Take 2 tablets (1,000 mg total) by mouth every 6 (six) hours as needed. Qty: 60 tablet, Refills: 11    ascorbic acid (VITAMIN C) 500 MG tablet Take 1 tablet (500 mg total) by mouth 2 (two) times daily. Qty: 60 tablet, Refills: 11    aspirin 81 MG chewable tablet Chew 2 tablets (162 mg total) by mouth daily. Qty: 60 tablet, Refills: 11    cetirizine (ZYRTEC) 10 MG tablet Take 1 tablet (10 mg total) by mouth at bedtime. Qty: 30 tablet, Refills: 11    feeding supplement, GLUCERNA SHAKE, (GLUCERNA SHAKE) LIQD Take 237 mLs by mouth 3 (three) times daily with meals. Refills: 0    ferrous sulfate 325 (65 FE) MG tablet Take 1 tablet (325 mg total) by mouth 2 (two) times daily with a meal. Qty: 60 tablet, Refills: 11    fluticasone (FLONASE) 50 MCG/ACT nasal spray Place 1 spray into both nostrils daily. Qty: 16 g, Refills: 5    insulin aspart (NOVOLOG FLEXPEN) 100 UNIT/ML FlexPen 70-120=0    251-300=5 121-150=1  301-350=7 151-200=2  351-400=9 201-250=3  400 or >, call MD  units of insulin Qty: 15 mL, Refills: 11    Insulin Glargine (LANTUS) 100 UNIT/ML Solostar Pen Inject 12 Units into the skin 2 (two) times daily. Qty: 15 mL, Refills: 5    ipratropium-albuterol (DUONEB) 0.5-2.5 (3) MG/3ML SOLN Take 3 mLs by nebulization every 6 (six) hours as needed. Qty: 360 mL, Refills: 1    Melatonin 3 MG TABS Take 1.5 mg by mouth at bedtime as needed (insomnia).     metoprolol tartrate (LOPRESSOR) 25 MG tablet Take 1 tablet (25 mg total) by mouth 2 (two) times daily. Qty: 60 tablet, Refills: 3    nitroGLYCERIN (NITROSTAT) 0.4 MG SL tablet Place 1 tablet (0.4 mg total) under the tongue every 5 (five) minutes as needed for chest pain. Qty: 25 tablet, Refills: 2    ondansetron (ZOFRAN) 4 MG tablet Take 1 tablet (4 mg total) by mouth every 6 (six) hours as needed for nausea or vomiting. Qty: 30 tablet, Refills: 3    oxyCODONE (ROXICODONE) 5 MG immediate release tablet Take 0.5 tablets (2.5  mg total) by mouth every 4 (four) hours as needed for severe pain. Qty: 30 tablet, Refills: 0   Associated Diagnoses: Pain in joint, shoulder region, unspecified laterality    pantoprazole (PROTONIX) 40 MG tablet Take 1 tablet (40 mg total) by mouth daily. Qty: 30 tablet, Refills: 3    polyethylene glycol (MIRALAX / GLYCOLAX) packet Take 17 g by mouth every other day. Qty: 24 each, Refills: 11    sodium chloride 1 G tablet Take 1 g by mouth 2 (two) times daily.    torsemide (DEMADEX) 20 MG tablet Take 1 tablet (20 mg total) by mouth 2 (two) times daily. Qty: 60 tablet, Refills: 11    traZODone (DESYREL) 50 MG tablet Take 1 tablet (50 mg total) by mouth at bedtime. Qty: 30 tablet, Refills: 2    ACCU-CHEK FASTCLIX LANCETS MISC Check BS QID - Fasting, before meals and QHS Qty: 100 each, Refills: 5    B-D ULTRAFINE III SHORT PEN 31G X 8 MM MISC 5 each by Does not apply route 5 (five) times daily.  Qty: 150 each, Refills: 11    Blood Glucose Monitoring Suppl (ACCU-CHEK AVIVA) device Use as instructed Qty: 1 each, Refills: 0    glucose blood (ACCU-CHEK SMARTVIEW) test strip Check BS QID - Dx: E11.9 Qty: 100 each, Refills: 12    sennosides-docusate sodium (SENOKOT-S) 8.6-50 MG tablet Take 1 tablet by mouth 2 (two) times daily. Qty: 60 tablet, Refills: 11    simvastatin (ZOCOR) 20 MG tablet Take 1 tablet (20 mg total) by mouth daily. Qty: 30 tablet, Refills: 5      STOP taking these medications     nitrofurantoin, macrocrystal-monohydrate, (MACROBID) 100 MG capsule      UNABLE TO FIND      UNABLE TO FIND      UNABLE TO FIND        Allergies  Allergen Reactions  . Aspirin Other (See Comments)    High doses caused stomach bleeds   Follow-up Information    Follow up with Rozann Lesches, MD On 06/27/2014.   Specialty:  Cardiology   Why:  10:30   Contact information:   [place] [address] [phone]       Follow up with [facility].    Specialty:  Home Health Services   Contact information:   [place] [address] [phone]        The results of significant diagnostics from this hospitalization (including imaging, microbiology, ancillary and laboratory) are listed below for reference.    Significant Diagnostic Studies: Dg Chest 2 View  06/17/2014   CLINICAL DATA:  Confusion. "Kidney infection" .  EXAM: CHEST  2 VIEW  COMPARISON:  05/30/2014  FINDINGS: Lateral view degraded by patient arm position. Prior median sternotomy. Lateral view else degraded by artifact posteriorly.  Mitral valve repair. Chin overlies the right apex. Cardiomegaly accentuated by AP portable technique. Atherosclerosis in the transverse aorta. No definite pleural fluid. Diminished lung volumes. Suspect mild interstitial edema. Left mid lung scarring. Minimal bibasilar volume loss.  IMPRESSION: Cardiomegaly with diminished lung volumes. Suspect mild interstitial edema.  Left mid lung scarring with mild bibasilar volume loss and subsegmental atelectasis.   Electronically Signed   By: Abigail Miyamoto M.D.   On: 06/17/2014 18:47   Dg Chest 2 View  05/30/2014   CLINICAL DATA:  [place] 1 week ago. Left-sided shoulder region pain. Shortness of breath.  EXAM: CHEST  2 VIEW  COMPARISON:  04/30/2014  FINDINGS: Previous median sternotomy, CABG and mitral valve replacement. Previous coronary stents. The heart is mildly enlarged. There is calcification of the thoracic aorta. Chronic linear scarring in the left lung is again demonstrated. No evidence of rib fracture, pneumothorax or hemothorax. No evidence of active heart failure.  IMPRESSION: No acute finding. Cardiovascular findings as above. Left-sided pulmonary scarring.   Electronically Signed   By: Nelson Chimes M.D.   On: 05/30/2014 11:33   US Abdomen Complete  06/18/2014   CLINICAL DATA:  Elevated LFTs.  EXAM: ULTRASOUND ABDOMEN COMPLETE  COMPARISON:  CT 10/14/2009  FINDINGS: Gallbladder: Prior  cholecystectomy  Common bile duct: Diameter: Normal caliber, 6 mm  Liver: No focal lesion identified. Within normal limits in parenchymal echogenicity.  IVC: No abnormality visualized.  Pancreas: Visualized portion unremarkable.  Spleen: Size and appearance within normal limits.  Right Kidney: Length: 12.3 cm. Multiple small complex cysts, the largest 1.7 cm in the lower pole with internal septation. These likely represent benign complex cysts. No hydronephrosis.  Left Kidney: Length: 11.8 cm. 2.4 cm  simple appearing lower pole cyst. No hydronephrosis.  Abdominal aorta: Atherosclerotic plaque and irregularity of the wall. No aneurysm.  Other findings: Mild ascites adjacent to the liver. Small right pleural effusion.  IMPRESSION: No acute findings.  Mild ascites, small right pleural effusion.   Electronically Signed   By: Rolm Baptise M.D.   On: 06/18/2014 08:19   Dg Shoulder Left  05/30/2014   CLINICAL DATA:  [place] 1 week ago.  Pain.  Initial evaluation.  EXAM: LEFT SHOULDER - 2+ VIEW  COMPARISON:  None.  FINDINGS: No evidence of fracture dislocation. No evidence of separation. Acromioclavicular glenohumeral degenerative change. Vascular calcification. Left base subsegmental atelectasis and/or scarring. Prior CABG.  IMPRESSION: No acute abnormality. Acromioclavicular glenohumeral degenerative change.   Electronically Signed   By: [place]   On: 05/30/2014 11:35   US Abdomen Limited Ruq  06/06/2014   CLINICAL DATA:  Elevated LFTs.  Prior cholecystectomy.  EXAM: US ABDOMEN LIMITED - RIGHT UPPER QUADRANT  COMPARISON:  None.  FINDINGS: Gallbladder:  Absent.  Common bile duct:  Diameter: 5 mm in caliber.  Liver:  No focal lesion identified. Within normal limits in parenchymal echogenicity.  IMPRESSION: Postcholecystectomy.  Within normal limits.   Electronically Signed   By: Marybelle Killings M.D.   On: 06/06/2014 09:19    Microbiology: Recent Results (from the past 240 hour(s))  Urine culture     Status:  None (Preliminary result)   Collection Time: 06/17/14  6:01 PM  Result Value Ref Range Status   Specimen Description URINE, CATHETERIZED  Final   Special Requests NONE  Final   Colony Count   Final    >=100,000 COLONIES/ML Performed at [facility]    Culture   Final    KLEBSIELLA OXYTOCA Note: Confirmed Extended Spectrum Beta-Lactamase Producer (ESBL) CRITICAL RESULT CALLED TO, READ BACK BY AND VERIFIED WITH: VICTORIA CUMMINGS AT 4:46 A.M. ON 06/20/2014 WARRB Performed at [facility]    Report Status PENDING  Incomplete   Organism ID, Bacteria KLEBSIELLA OXYTOCA  Final      Susceptibility   Klebsiella oxytoca - MIC*    AMPICILLIN >=32 RESISTANT Resistant     CEFAZOLIN >=64 RESISTANT Resistant     CEFTRIAXONE >=64 RESISTANT Resistant     CIPROFLOXACIN >=4 RESISTANT Resistant     GENTAMICIN >=16 RESISTANT Resistant     LEVOFLOXACIN >=8 RESISTANT Resistant     NITROFURANTOIN 256 RESISTANT Resistant     TOBRAMYCIN >=16 RESISTANT Resistant     TRIMETH/SULFA >=320 RESISTANT Resistant     IMIPENEM <=0.25 SENSITIVE Sensitive     PIP/TAZO 32 INTERMEDIATE Intermediate     * KLEBSIELLA OXYTOCA     Labs: Basic Metabolic Panel:  Recent Labs Lab 06/17/14 1800 06/18/14 0228 06/19/14 0912 06/20/14 0555  NA 130* 131* 131* 131*  K 4.4 3.9 4.0 4.5  CL 94* 99 97 98  CO2 25 26 24 25   GLUCOSE 86 72 295* 182*  BUN 63* 62* 51* 53*  CREATININE 2.29* 2.05* 1.99* 1.95*  CALCIUM 9.1 8.5 8.5 8.3*   Liver Function Tests:  Recent Labs Lab 06/17/14 1800 06/18/14 0228  AST 50* 33  ALT 38* 29  ALKPHOS 540* 433*  BILITOT 1.1 0.9  PROT 7.9 6.5  ALBUMIN 3.2* 2.6*   No results for input(s): LIPASE, AMYLASE in the last 168 hours.  Recent Labs Lab 06/17/14 1800  AMMONIA 35*   CBC:  Recent Labs Lab 06/17/14 1800 06/18/14 0228  WBC 6.5 5.2  NEUTROABS 4.3  --   HGB 10.8* 9.9*  HCT 34.1* 31.2*  MCV 89.5 88.6  PLT 212 185   Cardiac Enzymes:  Recent Labs Lab  06/17/14 1800 06/17/14 2115 06/18/14 0228 06/18/14 0850  TROPONINI 0.04* 0.03 0.03 0.03   BNP: BNP (last 3 results)  Recent Labs  06/17/14 1800  BNP 888.0*    ProBNP (last 3 results)  Recent Labs  12/29/13 0340 12/30/13 0255 02/23/14 1915  PROBNP 10783.0* 12014.0* 16625.0*    CBG:  Recent Labs Lab 06/19/14 0716 06/19/14 1127 06/19/14 1622 06/19/14 2145 06/20/14 0752  GLUCAP 252* 270* 235* 235* 179*       Signed:  Charlynne Cousins  [facility] 06/20/2014, 10:39 AM

## 2014-06-21 DIAGNOSIS — F339 Major depressive disorder, recurrent, unspecified: Secondary | ICD-10-CM | POA: Diagnosis not present

## 2014-06-21 DIAGNOSIS — N184 Chronic kidney disease, stage 4 (severe): Secondary | ICD-10-CM | POA: Diagnosis not present

## 2014-06-21 DIAGNOSIS — I509 Heart failure, unspecified: Secondary | ICD-10-CM | POA: Diagnosis not present

## 2014-06-21 DIAGNOSIS — I129 Hypertensive chronic kidney disease with stage 1 through stage 4 chronic kidney disease, or unspecified chronic kidney disease: Secondary | ICD-10-CM | POA: Diagnosis not present

## 2014-06-21 DIAGNOSIS — I4891 Unspecified atrial fibrillation: Secondary | ICD-10-CM | POA: Diagnosis not present

## 2014-06-21 DIAGNOSIS — M6281 Muscle weakness (generalized): Secondary | ICD-10-CM | POA: Diagnosis not present

## 2014-06-21 DIAGNOSIS — I10 Essential (primary) hypertension: Secondary | ICD-10-CM | POA: Diagnosis not present

## 2014-06-21 DIAGNOSIS — E119 Type 2 diabetes mellitus without complications: Secondary | ICD-10-CM | POA: Diagnosis not present

## 2014-06-21 DIAGNOSIS — D649 Anemia, unspecified: Secondary | ICD-10-CM | POA: Diagnosis not present

## 2014-06-21 LAB — URINE CULTURE: Colony Count: >=100000

## 2014-06-23 ENCOUNTER — Telehealth: Payer: Self-pay | Admitting: *Deleted

## 2014-06-23 NOTE — Telephone Encounter (Signed)
Submitted [facility] referral thru acuity connect for authorization 06/20/14 to Dr. Verlin Dike cardiologist with authorization number [id number]  Requesting provider:Warren Pickard,MD  Treating provider: Verlin Dike  Number of visits: 6  Start Date: 06/27/14  End Date: 12/24/14  Dx: I25.10-[place] Heart disease of native coronary artery w/o ang pectrs  Copy has been faxed to Dr. Johnsie Cancel office for review and records

## 2014-06-24 ENCOUNTER — Telehealth: Payer: Self-pay | Admitting: Family Medicine

## 2014-06-24 ENCOUNTER — Telehealth: Payer: Self-pay | Admitting: *Deleted

## 2014-06-24 DIAGNOSIS — M25519 Pain in unspecified shoulder: Secondary | ICD-10-CM

## 2014-06-24 DIAGNOSIS — D649 Anemia, unspecified: Secondary | ICD-10-CM | POA: Diagnosis not present

## 2014-06-24 DIAGNOSIS — I4891 Unspecified atrial fibrillation: Secondary | ICD-10-CM | POA: Diagnosis not present

## 2014-06-24 DIAGNOSIS — F339 Major depressive disorder, recurrent, unspecified: Secondary | ICD-10-CM | POA: Diagnosis not present

## 2014-06-24 DIAGNOSIS — N184 Chronic kidney disease, stage 4 (severe): Secondary | ICD-10-CM | POA: Diagnosis not present

## 2014-06-24 DIAGNOSIS — E119 Type 2 diabetes mellitus without complications: Secondary | ICD-10-CM | POA: Diagnosis not present

## 2014-06-24 DIAGNOSIS — I509 Heart failure, unspecified: Secondary | ICD-10-CM | POA: Diagnosis not present

## 2014-06-24 DIAGNOSIS — I129 Hypertensive chronic kidney disease with stage 1 through stage 4 chronic kidney disease, or unspecified chronic kidney disease: Secondary | ICD-10-CM | POA: Diagnosis not present

## 2014-06-24 DIAGNOSIS — M6281 Muscle weakness (generalized): Secondary | ICD-10-CM | POA: Diagnosis not present

## 2014-06-24 DIAGNOSIS — I10 Essential (primary) hypertension: Secondary | ICD-10-CM | POA: Diagnosis not present

## 2014-06-24 MED ORDER — OXYCODONE HCL 5 MG PO TABS: 2.5000 mg | ORAL_TABLET | ORAL | Status: DC | PRN

## 2014-06-24 NOTE — Telephone Encounter (Signed)
Tead from [facility] called and left a VM stating he had went out to the pts home today for a PT evaluation from her recent discharge from the hospital and is needing verbal orders, they will be going out there for the next 3 weeks [phone]

## 2014-06-24 NOTE — Telephone Encounter (Signed)
ok 

## 2014-06-24 NOTE — Telephone Encounter (Signed)
?   OK to Refill  

## 2014-06-24 NOTE — Telephone Encounter (Signed)
Received fax from [facility] on 06/23/14 Discharged from [facility]  Date of admit:06/17/14  Date of discharge:06/20/14  Level of Care:Inpatient Acute Medical care  [facility]  Attending physician:Gagan Darrick Meigs, MD  IBB:[id number] Pickard,MD  Discharge Dispostion:Discharged/transferred to Home under care of organized home health service  DX: R41.0-Disorientation, unspecified

## 2014-06-24 NOTE — Telephone Encounter (Signed)
RX printed, left up front and patient aware to pick up via vm 

## 2014-06-24 NOTE — Telephone Encounter (Signed)
Claudia [name] calling for mom saying she needs refill on oxycodone  [phone]

## 2014-06-24 NOTE — Telephone Encounter (Signed)
Called Tead back and verbal order given

## 2014-06-25 ENCOUNTER — Telehealth: Payer: Self-pay | Admitting: *Deleted

## 2014-06-25 NOTE — Telephone Encounter (Signed)
Received a fax from [facility] care mgmt informing us that Claudia [name] is closing this members case from care management services related to member has refused care management services d/t outstanding issues or barries family member (son) reports no barries to care, has declined [facility] program as he feels he has adequate support at home.   If have any questions contact [facility] care mgmt at [phone]

## 2014-06-26 ENCOUNTER — Ambulatory Visit (INDEPENDENT_AMBULATORY_CARE_PROVIDER_SITE_OTHER): Payer: Commercial Managed Care - HMO | Admitting: Family Medicine

## 2014-06-26 ENCOUNTER — Encounter: Payer: Self-pay | Admitting: Family Medicine

## 2014-06-26 VITALS — BP 108/60 | HR 58 | Temp 98.1°F | Resp 16 | Ht 67.0 in | Wt 197.0 lb

## 2014-06-26 DIAGNOSIS — M25512 Pain in left shoulder: Secondary | ICD-10-CM

## 2014-06-26 DIAGNOSIS — J810 Acute pulmonary edema: Secondary | ICD-10-CM

## 2014-06-26 DIAGNOSIS — Z09 Encounter for follow-up examination after completed treatment for conditions other than malignant neoplasm: Secondary | ICD-10-CM | POA: Diagnosis not present

## 2014-06-26 DIAGNOSIS — N390 Urinary tract infection, site not specified: Secondary | ICD-10-CM | POA: Diagnosis not present

## 2014-06-26 LAB — URINALYSIS, ROUTINE W REFLEX MICROSCOPIC
BILIRUBIN URINE: NEGATIVE
GLUCOSE, UA: NEGATIVE mg/dL
HGB URINE DIPSTICK: NEGATIVE
KETONES UR: NEGATIVE mg/dL
Leukocytes, UA: NEGATIVE
Nitrite: NEGATIVE
PH: 5 (ref 5.0–8.0)
Protein, ur: NEGATIVE mg/dL
Specific Gravity, Urine: 1.015 (ref 1.005–1.030)
Urobilinogen, UA: 0.2 mg/dL (ref 0.0–1.0)

## 2014-06-26 NOTE — Progress Notes (Signed)
Subjective:    Patient ID: Claudia [name], female    DOB: Aug 27, 1931, 81 y.o.   MRN: [id number]  HPI  please see my last office visits.   Patient developed a pan resistant urinary tract infection with Klebsiella.   She failed outpatient management with Macrobid which had only intermediate sensitivities. Therefore I recommended she go to the hospital for IV antibiotics. I have copied relevant portions of the discharge summary and included them below for my reference:  Admit date: 06/17/2014 Discharge date: 06/20/2014  Time spent: 35 minutes  Recommendations for Outpatient Follow-up:  1. Follow-up with PCP in 2 weeks.  Discharge Diagnoses:  Principal Problem:  Infection with ESBL Klebsiella oxytoca Active Problems:  Acute encephalopathy  DM (diabetes mellitus), type 2  CKD (chronic kidney disease) stage 4, GFR 15-29 ml/min  Elevated troponin  UTI (lower urinary tract infection)   Discharge Condition: Stable  Diet recommendation: heart healthy  Filed Weights   06/17/14 1718 06/17/14 2109  Weight: 83.008 kg (183 lb) 85.095 kg (187 lb 9.6 oz)    History of present illness:  81 year old female who has a past medical history of GERD (gastroesophageal reflux disease); PUD (peptic ulcer disease); Anemia; Hypertension; S/P endoscopy (Aug 2011); S/P colonoscopy (Sept 2011); Coronary artery disease; Shortness of breath; Diabetes mellitus; Headache(784.0); Cancer; Arthritis; Osteopenia; Hypercholesterolemia; SIADH (syndrome of inappropriate ADH production); CHF (congestive heart failure); Myocardial infarction (2013); and Chronic renal failure, stage 3 (moderate). Today was brought to the hospital by patient's son for worsening lethargy and weakness. As per patient's son she was diagnosed with UTI last week and started on Cipro, the antibiotics were changed by PCP as per patient's son as Cipro did not work. Patient continues to feel generalized weakness, lethargy. She  denies dysuria but complains of taking long time to empty the bladder. Patient has been having episodic memory loss and intermittently not responding during the day. As per patient's son this has been going on for a while. She does not have a history of stroke or seizures in the past. Patient denies any chest pain or shortness of breath no nausea vomiting or diarrhea.  Hospital Course:  Acute encephalopathy/?due to UTI (lower urinary tract infection) with ESBL Klebsiella oxytoca: - She's had 2 previous urine cultures that show Klebsiella oxytoca which is sensitive to imipenem and fosfomycin - Afebrile, no leukocytosis.  - she will cont Fosfomycin for 3 additional days. - PT rec home health.  Hyponatremia: - Seems to be hypovolemic hyponatremia, basic metabolic panel showed improved. - Treated with IV hydration.  Acute on chronic kidney disease stage III/diabetic nephropathy: - Unclear etiology seems to be hypovolemia, she was started on IV fluids and her creatinine has slowly improved. - baseline Creatinine 1.5-1.9  Elevated troponin: - Resolved with hydration she continues to be asymptomatic. - Echocardiogram shows normal motion abnormality.  DM (diabetes mellitus), type 2: - no changes were made to her regimen.  Chronic diastolic heart failure: - Stable, seems to be euvolemic. KVO IV fluids.  History of aortic bioprosthetic valve replacement:  Mild pulmonary hypertension by echocardiogram: Follow-up with cardiology is no patient.  Elevated LFTs: - Showed no acute findings, mostly due to UTI.  Procedures: Abdominal ultrasound 2-D echo Chest x-ray Consultations:  none  Discharge Exam: Filed Vitals:   06/20/14 0535  BP: 119/49  Pulse: 60  Temp: 98 F (36.7 C)  Resp: 20    General: A&O x3 Cardiovascular: RRR Respiratory: good air movement CTA B/L  Discharge Instructions  Discharge Instructions    Diet - low sodium heart healthy   Complete by: As directed      Increase activity slowly  Complete by: As directed           Current Discharge Medication List    START taking these medications   Details  fosfomycin (MONUROL) 3 G PACK Take 3 g by mouth every 3 (three) days. Qty: 3 g, Refills: 0      CONTINUE these medications which have NOT CHANGED   Details  acetaminophen (TYLENOL) 500 MG tablet Take 2 tablets (1,000 mg total) by mouth every 6 (six) hours as needed. Qty: 60 tablet, Refills: 11    ascorbic acid (VITAMIN C) 500 MG tablet Take 1 tablet (500 mg total) by mouth 2 (two) times daily. Qty: 60 tablet, Refills: 11    aspirin 81 MG chewable tablet Chew 2 tablets (162 mg total) by mouth daily. Qty: 60 tablet, Refills: 11    cetirizine (ZYRTEC) 10 MG tablet Take 1 tablet (10 mg total) by mouth at bedtime. Qty: 30 tablet, Refills: 11    feeding supplement, GLUCERNA SHAKE, (GLUCERNA SHAKE) LIQD Take 237 mLs by mouth 3 (three) times daily with meals. Refills: 0    ferrous sulfate 325 (65 FE) MG tablet Take 1 tablet (325 mg total) by mouth 2 (two) times daily with a meal. Qty: 60 tablet, Refills: 11    fluticasone (FLONASE) 50 MCG/ACT nasal spray Place 1 spray into both nostrils daily. Qty: 16 g, Refills: 5    insulin aspart (NOVOLOG FLEXPEN) 100 UNIT/ML FlexPen 70-120=0 251-300=5 121-150=1 301-350=7 151-200=2 351-400=9 201-250=3 400 or >, call MD units of insulin Qty: 15 mL, Refills: 11    Insulin Glargine (LANTUS) 100 UNIT/ML Solostar Pen Inject 12 Units into the skin 2 (two) times daily. Qty: 15 mL, Refills: 5    ipratropium-albuterol (DUONEB) 0.5-2.5 (3) MG/3ML SOLN Take 3 mLs by nebulization every 6 (six) hours as needed. Qty: 360 mL, Refills: 1    Melatonin 3 MG TABS Take 1.5 mg by mouth at bedtime as needed (insomnia).     metoprolol tartrate (LOPRESSOR) 25 MG tablet Take 1 tablet (25 mg total) by mouth 2 (two) times  daily. Qty: 60 tablet, Refills: 3    nitroGLYCERIN (NITROSTAT) 0.4 MG SL tablet Place 1 tablet (0.4 mg total) under the tongue every 5 (five) minutes as needed for chest pain. Qty: 25 tablet, Refills: 2    ondansetron (ZOFRAN) 4 MG tablet Take 1 tablet (4 mg total) by mouth every 6 (six) hours as needed for nausea or vomiting. Qty: 30 tablet, Refills: 3    oxyCODONE (ROXICODONE) 5 MG immediate release tablet Take 0.5 tablets (2.5 mg total) by mouth every 4 (four) hours as needed for severe pain. Qty: 30 tablet, Refills: 0   Associated Diagnoses: Pain in joint, shoulder region, unspecified laterality    pantoprazole (PROTONIX) 40 MG tablet Take 1 tablet (40 mg total) by mouth daily. Qty: 30 tablet, Refills: 3    polyethylene glycol (MIRALAX / GLYCOLAX) packet Take 17 g by mouth every other day. Qty: 24 each, Refills: 11    sodium chloride 1 G tablet Take 1 g by mouth 2 (two) times daily.    torsemide (DEMADEX) 20 MG tablet Take 1 tablet (20 mg total) by mouth 2 (two) times daily. Qty: 60 tablet, Refills: 11    traZODone (DESYREL) 50 MG tablet Take 1 tablet (50 mg total) by mouth at bedtime. Qty: 30 tablet, Refills: 2  ACCU-CHEK FASTCLIX LANCETS MISC Check BS QID - Fasting, before meals and QHS Qty: 100 each, Refills: 5    B-D ULTRAFINE III SHORT PEN 31G X 8 MM MISC 5 each by Does not apply route 5 (five) times daily. Qty: 150 each, Refills: 11    Blood Glucose Monitoring Suppl (ACCU-CHEK AVIVA) device Use as instructed Qty: 1 each, Refills: 0    glucose blood (ACCU-CHEK SMARTVIEW) test strip Check BS QID - Dx: E11.9 Qty: 100 each, Refills: 12    sennosides-docusate sodium (SENOKOT-S) 8.6-50 MG tablet Take 1 tablet by mouth 2 (two) times daily. Qty: 60 tablet, Refills: 11    simvastatin (ZOCOR) 20 MG tablet Take 1 tablet (20 mg total) by mouth daily. Qty: 30 tablet, Refills: 5          patient  Looks like she feels poorly.  She is getting increasingly weaker. She denies any chest pain or shortness of breath however she continues to have severe pain in her left shoulder. Initial x-rays of the shoulder were negative. Cold weather as an exacerbating factor to the shoulder pain. Movement does not seem to aggravate the shoulder pain. At times the pain will radiate into her left elbow. Oxycodone does not seem to help the pain at all. Her weight is up 7 pounds since hospitalization. She was given IV fluids in the hospital. She is fluid overloaded on exam today. She has +1 pitting edema in both legs and by basilar crackles in both lungs. She reports weakness and lethargy. However she denies confusion or delirium. Past Medical History  Diagnosis Date  . GERD (gastroesophageal reflux disease)   . PUD (peptic ulcer disease)   . Anemia   . Hypertension   . S/P endoscopy Aug 2011    3 superficial gastric ulcers, NSAID-induced  . S/P colonoscopy Sept 2011    left-sided diverticula, tubular adenoma  . Coronary artery disease   . Shortness of breath   . Diabetes mellitus     insulin dependent  . Headache(784.0)     rare migraines  . Cancer     hx of skin cancer  . Arthritis   . Osteopenia   . Hypercholesterolemia   . SIADH (syndrome of inappropriate ADH production)   . CHF (congestive heart failure)   . Myocardial infarction 2013  . Chronic renal failure, stage 3 (moderate)    Past Surgical History  Procedure Laterality Date  . Tonsillectomy    . Appendectomy    . Eye surgery      cataracts  . Cardiac stents  07/19/2011  . Esophagogastroduodenoscopy  10/16/09    normal without barrett's/three superficial gastric ulcers  . Colonoscopy  12/04/09    normal rectum/left-sided diverticula  . Joint replacement  arthroscopy to knee  . Toe amputation Left 2013    left second toe amputation  . Cardiac catheterization  01/22/2013  . Coronary angioplasty  07/2011  . Coronary artery bypass graft N/A 11/08/2013    Procedure:  CORONARY ARTERY BYPASS GRAFTING (CABG), on pump, times two, using left internal mammary artery, cryo saphenous vein.;  Surgeon: Ivin Poot, MD;  Location: [place];  Service: Open Heart Surgery;  Laterality: N/A;  LIMA-LAD CRYOVEIN -OM  . Intraoperative transesophageal echocardiogram N/A 11/08/2013    Procedure: INTRAOPERATIVE TRANSESOPHAGEAL ECHOCARDIOGRAM;  Surgeon: Ivin Poot, MD;  Location: [place];  Service: Open Heart Surgery;  Laterality: N/A;  . Mitral valve replacement N/A 11/08/2013    Procedure: MITRAL VALVE (MV) REPLACEMENT;  Surgeon:  Ivin Poot, MD;  Location: [place];  Service: Open Heart Surgery;  Laterality: N/A;  #25 MAGNA MITRAL EASE  . Left heart catheterization with coronary angiogram N/A 07/19/2011    Procedure: LEFT HEART CATHETERIZATION WITH CORONARY ANGIOGRAM;  Surgeon: Laverda Page, MD;  Location: [facility] C[facility] LAB;  Service: Cardiovascular;  Laterality: N/A;  . Left heart catheterization with coronary angiogram N/A 12/21/2011    Procedure: LEFT HEART CATHETERIZATION WITH CORONARY ANGIOGRAM;  Surgeon: Laverda Page, MD;  Location: [facility] CATH LAB;  Service: Cardiovascular;  Laterality: N/A;  . Left heart catheterization with coronary angiogram N/A 02/20/2012    Procedure: LEFT HEART CATHETERIZATION WITH CORONARY ANGIOGRAM;  Surgeon: Laverda Page, MD;  Location: [facility] CATH LAB;  Service: Cardiovascular;  Laterality: N/A;  . Left heart catheterization with coronary angiogram N/A 09/03/2012    Procedure: LEFT HEART CATHETERIZATION WITH CORONARY ANGIOGRAM;  Surgeon: Laverda Page, MD;  Location: [facility] C[facility] LAB;  Service: Cardiovascular;  Laterality: N/A;  . Percutaneous coronary stent intervention (pci-s)  09/03/2012    Procedure: PERCUTANEOUS CORONARY STENT INTERVENTION (PCI-S);  Surgeon: Laverda Page, MD;  Location: [facility] CATH LAB;  Service: Cardiovascular;;  . Left heart catheterization with coronary angiogram N/A 12/04/2012    Procedure: LEFT HEART CATHETERIZATION WITH  CORONARY ANGIOGRAM;  Surgeon: Laverda Page, MD;  Location: [facility] CATH LAB;  Service: Cardiovascular;  Laterality: N/A;  . Left heart catheterization with coronary angiogram N/A 01/22/2013    Procedure: LEFT HEART CATHETERIZATION WITH CORONARY ANGIOGRAM;  Surgeon: Laverda Page, MD;  Location: [facility] CATH LAB;  Service: Cardiovascular;  Laterality: N/A;  . Left heart catheterization with coronary angiogram N/A 06/26/2013    Procedure: LEFT HEART CATHETERIZATION WITH CORONARY ANGIOGRAM;  Surgeon: Laverda Page, MD;  Location: [facility] CATH LAB;  Service: Cardiovascular;  Laterality: N/A;  . Left heart catheterization with coronary angiogram N/A 11/05/2013    Procedure: LEFT HEART CATHETERIZATION WITH CORONARY ANGIOGRAM;  Surgeon: Laverda Page, MD;  Location: [facility] CATH LAB;  Service: Cardiovascular;  Laterality: N/A;   Current Outpatient Prescriptions on File Prior to Visit  Medication Sig Dispense Refill  . ACCU-CHEK FASTCLIX LANCETS MISC Check BS QID - Fasting, before meals and QHS 100 each 5  . acetaminophen (TYLENOL) 500 MG tablet Take 2 tablets (1,000 mg total) by mouth every 6 (six) hours as needed. 60 tablet 11  . ascorbic acid (VITAMIN C) 500 MG tablet Take 1 tablet (500 mg total) by mouth 2 (two) times daily. 60 tablet 11  . aspirin 81 MG chewable tablet Chew 2 tablets (162 mg total) by mouth daily. 60 tablet 11  . B-D ULTRAFINE III SHORT PEN 31G X 8 MM MISC 5 each by Does not apply route 5 (five) times daily. 150 each 11  . Blood Glucose Monitoring Suppl (ACCU-CHEK AVIVA) device Use as instructed 1 each 0  . cetirizine (ZYRTEC) 10 MG tablet Take 1 tablet (10 mg total) by mouth at bedtime. 30 tablet 11  . feeding supplement, GLUCERNA SHAKE, (GLUCERNA SHAKE) LIQD Take 237 mLs by mouth 3 (three) times daily with meals.  0  . ferrous sulfate 325 (65 FE) MG tablet Take 1 tablet (325 mg total) by mouth 2 (two) times daily with a meal. 60 tablet 11  . fluticasone (FLONASE) 50 MCG/ACT nasal spray  Place 1 spray into both nostrils daily. 16 g 5  . fosfomycin (MONUROL) 3 G PACK Take 3 g by mouth every 3 (three) days. 3 g 0  .  glucose blood (ACCU-CHEK SMARTVIEW) test strip Check BS QID - Dx: E11.9 100 each 12  . insulin aspart (NOVOLOG FLEXPEN) 100 UNIT/ML FlexPen 70-120=0    251-300=5 121-150=1  301-350=7 151-200=2  351-400=9 201-250=3  400 or >, call MD  units of insulin (Patient taking differently: Inject 0-10 Units into the skin 3 (three) times daily with meals. 70-120=0    251-300=5 121-150=1  301-350=7 151-200=2  351-400=9 201-250=3  400 or >, call MD  units of insulin) 15 mL 11  . Insulin Glargine (LANTUS) 100 UNIT/ML Solostar Pen Inject 12 Units into the skin 2 (two) times daily. 15 mL 5  . ipratropium-albuterol (DUONEB) 0.5-2.5 (3) MG/3ML SOLN Take 3 mLs by nebulization every 6 (six) hours as needed. 360 mL 1  . Melatonin 3 MG TABS Take 1.5 mg by mouth at bedtime as needed (insomnia).     . metoprolol tartrate (LOPRESSOR) 25 MG tablet Take 1 tablet (25 mg total) by mouth 2 (two) times daily. 60 tablet 3  . nitroGLYCERIN (NITROSTAT) 0.4 MG SL tablet Place 1 tablet (0.4 mg total) under the tongue every 5 (five) minutes as needed for chest pain. 25 tablet 2  . ondansetron (ZOFRAN) 4 MG tablet Take 1 tablet (4 mg total) by mouth every 6 (six) hours as needed for nausea or vomiting. 30 tablet 3  . oxyCODONE (ROXICODONE) 5 MG immediate release tablet Take 0.5 tablets (2.5 mg total) by mouth every 4 (four) hours as needed for severe pain. 30 tablet 0  . pantoprazole (PROTONIX) 40 MG tablet Take 1 tablet (40 mg total) by mouth daily. 30 tablet 3  . polyethylene glycol (MIRALAX / GLYCOLAX) packet Take 17 g by mouth every other day. 24 each 11  . sennosides-docusate sodium (SENOKOT-S) 8.6-50 MG tablet Take 1 tablet by mouth 2 (two) times daily. 60 tablet 11  . sodium chloride 1 G tablet Take 1 g by mouth 2 (two) times daily.    Marland Kitchen torsemide (DEMADEX) 20 MG tablet Take 1 tablet (20 mg total)  by mouth 2 (two) times daily. 60 tablet 11  . traZODone (DESYREL) 50 MG tablet Take 1 tablet (50 mg total) by mouth at bedtime. 30 tablet 2  . simvastatin (ZOCOR) 20 MG tablet Take 1 tablet (20 mg total) by mouth daily. (Patient not taking: Reported on 06/26/2014) 30 tablet 5   No current facility-administered medications on file prior to visit.   Allergies  Allergen Reactions  . Aspirin Other (See Comments)    High doses caused stomach bleeds   History   Social History  . Marital Status: Widowed    Spouse Name: N/A  . Number of Children: N/A  . Years of Education: N/A   Occupational History  . Not on file.   Social History Main Topics  . Smoking status: Never Smoker   . Smokeless tobacco: Never Used  . Alcohol Use: No  . Drug Use: No  . Sexual Activity: No   Other Topics Concern  . Not on file   Social History Narrative      Review of Systems  All other systems reviewed and are negative.      Objective:   Physical Exam  Constitutional: She appears well-developed. She appears distressed.  Neck: Neck supple. No JVD present.  Cardiovascular: Normal rate, regular rhythm and normal heart sounds.   No murmur heard. Pulmonary/Chest: Effort normal. She has rales.  Abdominal: Soft. Bowel sounds are normal. She exhibits no distension and no mass. There is no  tenderness. There is no rebound and no guarding.  Musculoskeletal: She exhibits edema.  Vitals reviewed.   Patient is obviously in pain in her left shoulder.       Assessment & Plan:  Hospital discharge follow-up - Plan: COMPLETE METABOLIC PANEL WITH GFR, CBC with Differential/Platelet, Urine culture, Urinalysis, Routine w reflex microscopic, Ambulatory referral to Cardiology  Left shoulder pain  Acute pulmonary edema   I will check a CMP today to monitor her renal function. Baseline creatinine is between 2 and 2.5. I do believe she is fluid overloaded on examination today. I have advised her to continue the  torsemide 20 mg by mouth twice a day. She is already lost 6 pounds since discharge from the hospital. Blood pressure is stable today. We will continue her current dose of metoprolol. I will check a urine culture and urinalysis to ensure resolution of the urinary tract infection. I will also schedule her to follow-up with her cardiologist, Dr. Einar Gip.   Recheck next week.

## 2014-06-26 NOTE — Progress Notes (Signed)
   Subjective:    Patient ID: Claudia [name], female    DOB: [birth year], 81 y.o.   MRN: [id number]  HPI    Review of Systems     Objective:   Physical Exam        Assessment & Plan:   for the patient's left arm pain, she elected to proceed with a cortisone injection. Using sterile technique, I injected her left subacromial space with 2 mL of 0.1% lidocaine, 2 mL of Marcaine, and 2 mL of 40 mg per mL Kenalog. Patient tolerated the procedure wel without complication.

## 2014-06-27 ENCOUNTER — Telehealth: Payer: Self-pay | Admitting: Family Medicine

## 2014-06-27 ENCOUNTER — Other Ambulatory Visit: Payer: Self-pay | Admitting: Family Medicine

## 2014-06-27 ENCOUNTER — Ambulatory Visit: Payer: Commercial Managed Care - HMO | Admitting: Cardiovascular Disease

## 2014-06-27 DIAGNOSIS — I4891 Unspecified atrial fibrillation: Secondary | ICD-10-CM | POA: Diagnosis not present

## 2014-06-27 DIAGNOSIS — D649 Anemia, unspecified: Secondary | ICD-10-CM | POA: Diagnosis not present

## 2014-06-27 DIAGNOSIS — M6281 Muscle weakness (generalized): Secondary | ICD-10-CM | POA: Diagnosis not present

## 2014-06-27 DIAGNOSIS — F339 Major depressive disorder, recurrent, unspecified: Secondary | ICD-10-CM | POA: Diagnosis not present

## 2014-06-27 DIAGNOSIS — I10 Essential (primary) hypertension: Secondary | ICD-10-CM | POA: Diagnosis not present

## 2014-06-27 DIAGNOSIS — N184 Chronic kidney disease, stage 4 (severe): Secondary | ICD-10-CM | POA: Diagnosis not present

## 2014-06-27 DIAGNOSIS — E119 Type 2 diabetes mellitus without complications: Secondary | ICD-10-CM | POA: Diagnosis not present

## 2014-06-27 DIAGNOSIS — I129 Hypertensive chronic kidney disease with stage 1 through stage 4 chronic kidney disease, or unspecified chronic kidney disease: Secondary | ICD-10-CM | POA: Diagnosis not present

## 2014-06-27 DIAGNOSIS — I509 Heart failure, unspecified: Secondary | ICD-10-CM | POA: Diagnosis not present

## 2014-06-27 LAB — COMPLETE METABOLIC PANEL WITH GFR
ALT: 20 U/L (ref 0–35)
AST: 31 U/L (ref 0–37)
Albumin: 3 g/dL — ABNORMAL LOW (ref 3.5–5.2)
Alkaline Phosphatase: 513 U/L — ABNORMAL HIGH (ref 39–117)
BUN: 50 mg/dL — AB (ref 6–23)
CHLORIDE: 95 meq/L — AB (ref 96–112)
CO2: 24 mEq/L (ref 19–32)
CREATININE: 2.33 mg/dL — AB (ref 0.50–1.10)
Calcium: 8.3 mg/dL — ABNORMAL LOW (ref 8.4–10.5)
GFR, Est African American: 22 mL/min — ABNORMAL LOW
GFR, Est Non African American: 19 mL/min — ABNORMAL LOW
GLUCOSE: 99 mg/dL (ref 70–99)
POTASSIUM: 5.2 meq/L (ref 3.5–5.3)
Sodium: 132 mEq/L — ABNORMAL LOW (ref 135–145)
TOTAL PROTEIN: 6.4 g/dL (ref 6.0–8.3)
Total Bilirubin: 0.8 mg/dL (ref 0.2–1.2)

## 2014-06-27 LAB — CBC WITH DIFFERENTIAL/PLATELET
BASOS ABS: 0.1 10*3/uL (ref 0.0–0.1)
Basophils Relative: 1 % (ref 0–1)
EOS ABS: 0.2 10*3/uL (ref 0.0–0.7)
EOS PCT: 3 % (ref 0–5)
HCT: 34.7 % — ABNORMAL LOW (ref 36.0–46.0)
HEMOGLOBIN: 10.8 g/dL — AB (ref 12.0–15.0)
LYMPHS PCT: 15 % (ref 12–46)
Lymphs Abs: 0.8 10*3/uL (ref 0.7–4.0)
MCH: 27.5 pg (ref 26.0–34.0)
MCHC: 31.1 g/dL (ref 30.0–36.0)
MCV: 88.3 fL (ref 78.0–100.0)
MONO ABS: 0.8 10*3/uL (ref 0.1–1.0)
MPV: 10.1 fL (ref 8.6–12.4)
Monocytes Relative: 15 % — ABNORMAL HIGH (ref 3–12)
Neutro Abs: 3.5 10*3/uL (ref 1.7–7.7)
Neutrophils Relative %: 66 % (ref 43–77)
Platelets: 253 10*3/uL (ref 150–400)
RBC: 3.93 MIL/uL (ref 3.87–5.11)
RDW: 18 % — ABNORMAL HIGH (ref 11.5–15.5)
WBC: 5.3 10*3/uL (ref 4.0–10.5)

## 2014-06-27 MED ORDER — HYDROMORPHONE HCL 2 MG PO TABS: 2.0000 mg | ORAL_TABLET | ORAL | Status: DC | PRN

## 2014-06-27 MED ORDER — NITROGLYCERIN 0.4 MG SL SUBL: 0.4000 mg | SUBLINGUAL_TABLET | SUBLINGUAL | Status: AC | PRN

## 2014-06-27 NOTE — Telephone Encounter (Signed)
LMTRC

## 2014-06-27 NOTE — Telephone Encounter (Signed)
We could try dilaudid 2 mg every 4 hours prn pain (30)

## 2014-06-27 NOTE — Telephone Encounter (Signed)
Medication called/sent to requested pharmacy  

## 2014-06-27 NOTE — Telephone Encounter (Signed)
RX printed, left up front and patient's son aware to pick up  

## 2014-06-27 NOTE — Telephone Encounter (Signed)
Roselie Awkward is calling to see if any thing stronger can be called in for pain  [phone]

## 2014-06-28 LAB — URINE CULTURE
COLONY COUNT: NO GROWTH
Organism ID, Bacteria: NO GROWTH

## 2014-06-30 ENCOUNTER — Telehealth: Payer: Self-pay | Admitting: Family Medicine

## 2014-06-30 NOTE — Telephone Encounter (Signed)
Spoke to pt's son and the vein is still there but she is not complaining of a HA just being really tired. Told son that if it changed or she c/o headache or she started acting out of character to take her to the ER. Also informed him that her BP would fluctuate under the current situation and if it got above 200/100 he needs to seek immediate medical attention. He verbalized understanding.

## 2014-06-30 NOTE — Telephone Encounter (Signed)
Spoke to pt's son and her BP has been elevated  - 173/78 and he give her nitro after this on Sat and it came down to 145/73. He states that all she wants to do is sleep and he was talking to her this am and a hugh vein popped out starting at her left ear and expanding across her forehead. He did not know if this was something to be concerned about. Also the pain med (dilaudid) did not help at all she is still in a lot of pain with that left arm.

## 2014-06-30 NOTE — Telephone Encounter (Signed)
Claudia [name] calling to say that his mom is not acting right and has a funny vein sticking up in her head

## 2014-06-30 NOTE — Telephone Encounter (Signed)
Patient needs to be seen to determine what this pain is that is not normal. It is possible that she may have some type of obstruction or clot causing that.

## 2014-07-01 DIAGNOSIS — I4891 Unspecified atrial fibrillation: Secondary | ICD-10-CM | POA: Diagnosis not present

## 2014-07-01 DIAGNOSIS — I129 Hypertensive chronic kidney disease with stage 1 through stage 4 chronic kidney disease, or unspecified chronic kidney disease: Secondary | ICD-10-CM | POA: Diagnosis not present

## 2014-07-01 DIAGNOSIS — E119 Type 2 diabetes mellitus without complications: Secondary | ICD-10-CM | POA: Diagnosis not present

## 2014-07-01 DIAGNOSIS — F339 Major depressive disorder, recurrent, unspecified: Secondary | ICD-10-CM | POA: Diagnosis not present

## 2014-07-01 DIAGNOSIS — I10 Essential (primary) hypertension: Secondary | ICD-10-CM | POA: Diagnosis not present

## 2014-07-01 DIAGNOSIS — D649 Anemia, unspecified: Secondary | ICD-10-CM | POA: Diagnosis not present

## 2014-07-01 DIAGNOSIS — I509 Heart failure, unspecified: Secondary | ICD-10-CM | POA: Diagnosis not present

## 2014-07-01 DIAGNOSIS — M6281 Muscle weakness (generalized): Secondary | ICD-10-CM | POA: Diagnosis not present

## 2014-07-01 DIAGNOSIS — N184 Chronic kidney disease, stage 4 (severe): Secondary | ICD-10-CM | POA: Diagnosis not present

## 2014-07-02 DIAGNOSIS — I129 Hypertensive chronic kidney disease with stage 1 through stage 4 chronic kidney disease, or unspecified chronic kidney disease: Secondary | ICD-10-CM | POA: Diagnosis not present

## 2014-07-02 DIAGNOSIS — I4891 Unspecified atrial fibrillation: Secondary | ICD-10-CM | POA: Diagnosis not present

## 2014-07-02 DIAGNOSIS — F339 Major depressive disorder, recurrent, unspecified: Secondary | ICD-10-CM | POA: Diagnosis not present

## 2014-07-02 DIAGNOSIS — I509 Heart failure, unspecified: Secondary | ICD-10-CM | POA: Diagnosis not present

## 2014-07-02 DIAGNOSIS — M6281 Muscle weakness (generalized): Secondary | ICD-10-CM | POA: Diagnosis not present

## 2014-07-02 DIAGNOSIS — E119 Type 2 diabetes mellitus without complications: Secondary | ICD-10-CM | POA: Diagnosis not present

## 2014-07-02 DIAGNOSIS — N184 Chronic kidney disease, stage 4 (severe): Secondary | ICD-10-CM | POA: Diagnosis not present

## 2014-07-02 DIAGNOSIS — D649 Anemia, unspecified: Secondary | ICD-10-CM | POA: Diagnosis not present

## 2014-07-02 DIAGNOSIS — I10 Essential (primary) hypertension: Secondary | ICD-10-CM | POA: Diagnosis not present

## 2014-07-03 DIAGNOSIS — E1122 Type 2 diabetes mellitus with diabetic chronic kidney disease: Secondary | ICD-10-CM | POA: Diagnosis not present

## 2014-07-03 DIAGNOSIS — I2510 Atherosclerotic heart disease of native coronary artery without angina pectoris: Secondary | ICD-10-CM | POA: Diagnosis not present

## 2014-07-03 DIAGNOSIS — N184 Chronic kidney disease, stage 4 (severe): Secondary | ICD-10-CM | POA: Diagnosis not present

## 2014-07-03 DIAGNOSIS — I5033 Acute on chronic diastolic (congestive) heart failure: Secondary | ICD-10-CM | POA: Diagnosis not present

## 2014-07-04 ENCOUNTER — Encounter: Payer: Self-pay | Admitting: Family Medicine

## 2014-07-04 ENCOUNTER — Ambulatory Visit (INDEPENDENT_AMBULATORY_CARE_PROVIDER_SITE_OTHER): Payer: Commercial Managed Care - HMO | Admitting: Family Medicine

## 2014-07-04 VITALS — BP 122/56 | HR 58 | Temp 98.1°F | Resp 18 | Ht 67.0 in | Wt 190.0 lb

## 2014-07-04 DIAGNOSIS — Z794 Long term (current) use of insulin: Secondary | ICD-10-CM | POA: Diagnosis not present

## 2014-07-04 DIAGNOSIS — IMO0001 Reserved for inherently not codable concepts without codable children: Secondary | ICD-10-CM

## 2014-07-04 DIAGNOSIS — R531 Weakness: Secondary | ICD-10-CM

## 2014-07-04 DIAGNOSIS — R748 Abnormal levels of other serum enzymes: Secondary | ICD-10-CM

## 2014-07-04 DIAGNOSIS — E119 Type 2 diabetes mellitus without complications: Secondary | ICD-10-CM

## 2014-07-04 NOTE — Progress Notes (Signed)
Subjective:    Patient ID: Claudia [name], female    DOB: 1931/07/17, 81 y.o.   MRN: [id number]  HPI 06/26/14  please see my last office visits.   Patient developed a pan resistant urinary tract infection with Klebsiella.   She failed outpatient management with Macrobid which had only intermediate sensitivities. Therefore I recommended she go to the hospital for IV antibiotics. I have copied relevant portions of the discharge summary and included them below for my reference:  Admit date: 06/17/2014 Discharge date: 06/20/2014  Time spent: 35 minutes  Recommendations for Outpatient Follow-up:  1. Follow-up with PCP in 2 weeks.  Discharge Diagnoses:  Principal Problem:  Infection with ESBL Klebsiella oxytoca Active Problems:  Acute encephalopathy  DM (diabetes mellitus), type 2  CKD (chronic kidney disease) stage 4, GFR 15-29 ml/min  Elevated troponin  UTI (lower urinary tract infection)   Discharge Condition: Stable  Diet recommendation: heart healthy  Filed Weights   06/17/14 1718 06/17/14 2109  Weight: 83.008 kg (183 lb) 85.095 kg (187 lb 9.6 oz)    History of present illness:  81 year old female who has a past medical history of GERD (gastroesophageal reflux disease); PUD (peptic ulcer disease); Anemia; Hypertension; S/P endoscopy (Aug 2011); S/P colonoscopy (Sept 2011); Coronary artery disease; Shortness of breath; Diabetes mellitus; Headache(784.0); Cancer; Arthritis; Osteopenia; Hypercholesterolemia; SIADH (syndrome of inappropriate ADH production); CHF (congestive heart failure); Myocardial infarction (2013); and Chronic renal failure, stage 3 (moderate). Today was brought to the hospital by patient's son for worsening lethargy and weakness. As per patient's son she was diagnosed with UTI last week and started on Cipro, the antibiotics were changed by PCP as per patient's son as Cipro did not work. Patient continues to feel generalized weakness, lethargy.  She denies dysuria but complains of taking long time to empty the bladder. Patient has been having episodic memory loss and intermittently not responding during the day. As per patient's son this has been going on for a while. She does not have a history of stroke or seizures in the past. Patient denies any chest pain or shortness of breath no nausea vomiting or diarrhea.  Hospital Course:  Acute encephalopathy/?due to UTI (lower urinary tract infection) with ESBL Klebsiella oxytoca: - She's had 2 previous urine cultures that show Klebsiella oxytoca which is sensitive to imipenem and fosfomycin - Afebrile, no leukocytosis.  - she will cont Fosfomycin for 3 additional days. - PT rec home health.  Hyponatremia: - Seems to be hypovolemic hyponatremia, basic metabolic panel showed improved. - Treated with IV hydration.  Acute on chronic kidney disease stage III/diabetic nephropathy: - Unclear etiology seems to be hypovolemia, she was started on IV fluids and her creatinine has slowly improved. - baseline Creatinine 1.5-1.9  Elevated troponin: - Resolved with hydration she continues to be asymptomatic. - Echocardiogram shows normal motion abnormality.  DM (diabetes mellitus), type 2: - no changes were made to her regimen.  Chronic diastolic heart failure: - Stable, seems to be euvolemic. KVO IV fluids.  History of aortic bioprosthetic valve replacement:  Mild pulmonary hypertension by echocardiogram: Follow-up with cardiology is no patient.  Elevated LFTs: - Showed no acute findings, mostly due to UTI.  Procedures: Abdominal ultrasound 2-D echo Chest x-ray Consultations:  none  Discharge Exam: Filed Vitals:   06/20/14 0535  BP: 119/49  Pulse: 60  Temp: 98 F (36.7 C)  Resp: 20    General: A&O x3 Cardiovascular: RRR Respiratory: good air movement CTA B/L  Discharge Instructions  Discharge Instructions    Diet - low sodium heart healthy   Complete by: As directed      Increase activity slowly  Complete by: As directed           Current Discharge Medication List    START taking these medications   Details  fosfomycin (MONUROL) 3 G PACK Take 3 g by mouth every 3 (three) days. Qty: 3 g, Refills: 0      CONTINUE these medications which have NOT CHANGED   Details  acetaminophen (TYLENOL) 500 MG tablet Take 2 tablets (1,000 mg total) by mouth every 6 (six) hours as needed. Qty: 60 tablet, Refills: 11    ascorbic acid (VITAMIN C) 500 MG tablet Take 1 tablet (500 mg total) by mouth 2 (two) times daily. Qty: 60 tablet, Refills: 11    aspirin 81 MG chewable tablet Chew 2 tablets (162 mg total) by mouth daily. Qty: 60 tablet, Refills: 11    cetirizine (ZYRTEC) 10 MG tablet Take 1 tablet (10 mg total) by mouth at bedtime. Qty: 30 tablet, Refills: 11    feeding supplement, GLUCERNA SHAKE, (GLUCERNA SHAKE) LIQD Take 237 mLs by mouth 3 (three) times daily with meals. Refills: 0    ferrous sulfate 325 (65 FE) MG tablet Take 1 tablet (325 mg total) by mouth 2 (two) times daily with a meal. Qty: 60 tablet, Refills: 11    fluticasone (FLONASE) 50 MCG/ACT nasal spray Place 1 spray into both nostrils daily. Qty: 16 g, Refills: 5    insulin aspart (NOVOLOG FLEXPEN) 100 UNIT/ML FlexPen 70-120=0 251-300=5 121-150=1 301-350=7 151-200=2 351-400=9 201-250=3 400 or >, call MD units of insulin Qty: 15 mL, Refills: 11    Insulin Glargine (LANTUS) 100 UNIT/ML Solostar Pen Inject 12 Units into the skin 2 (two) times daily. Qty: 15 mL, Refills: 5    ipratropium-albuterol (DUONEB) 0.5-2.5 (3) MG/3ML SOLN Take 3 mLs by nebulization every 6 (six) hours as needed. Qty: 360 mL, Refills: 1    Melatonin 3 MG TABS Take 1.5 mg by mouth at bedtime as needed (insomnia).     metoprolol tartrate (LOPRESSOR) 25 MG tablet Take 1 tablet (25 mg total) by mouth 2 (two) times  daily. Qty: 60 tablet, Refills: 3    nitroGLYCERIN (NITROSTAT) 0.4 MG SL tablet Place 1 tablet (0.4 mg total) under the tongue every 5 (five) minutes as needed for chest pain. Qty: 25 tablet, Refills: 2    ondansetron (ZOFRAN) 4 MG tablet Take 1 tablet (4 mg total) by mouth every 6 (six) hours as needed for nausea or vomiting. Qty: 30 tablet, Refills: 3    oxyCODONE (ROXICODONE) 5 MG immediate release tablet Take 0.5 tablets (2.5 mg total) by mouth every 4 (four) hours as needed for severe pain. Qty: 30 tablet, Refills: 0   Associated Diagnoses: Pain in joint, shoulder region, unspecified laterality    pantoprazole (PROTONIX) 40 MG tablet Take 1 tablet (40 mg total) by mouth daily. Qty: 30 tablet, Refills: 3    polyethylene glycol (MIRALAX / GLYCOLAX) packet Take 17 g by mouth every other day. Qty: 24 each, Refills: 11    sodium chloride 1 G tablet Take 1 g by mouth 2 (two) times daily.    torsemide (DEMADEX) 20 MG tablet Take 1 tablet (20 mg total) by mouth 2 (two) times daily. Qty: 60 tablet, Refills: 11    traZODone (DESYREL) 50 MG tablet Take 1 tablet (50 mg total) by mouth at bedtime. Qty: 30 tablet, Refills: 2  ACCU-CHEK FASTCLIX LANCETS MISC Check BS QID - Fasting, before meals and QHS Qty: 100 each, Refills: 5    B-D ULTRAFINE III SHORT PEN 31G X 8 MM MISC 5 each by Does not apply route 5 (five) times daily. Qty: 150 each, Refills: 11    Blood Glucose Monitoring Suppl (ACCU-CHEK AVIVA) device Use as instructed Qty: 1 each, Refills: 0    glucose blood (ACCU-CHEK SMARTVIEW) test strip Check BS QID - Dx: E11.9 Qty: 100 each, Refills: 12    sennosides-docusate sodium (SENOKOT-S) 8.6-50 MG tablet Take 1 tablet by mouth 2 (two) times daily. Qty: 60 tablet, Refills: 11    simvastatin (ZOCOR) 20 MG tablet Take 1 tablet (20 mg total) by mouth daily. Qty: 30 tablet, Refills: 5          patient  Looks like she feels poorly.  She is getting increasingly weaker. She denies any chest pain or shortness of breath however she continues to have severe pain in her left shoulder. Initial x-rays of the shoulder were negative. Cold weather as an exacerbating factor to the shoulder pain. Movement does not seem to aggravate the shoulder pain. At times the pain will radiate into her left elbow. Oxycodone does not seem to help the pain at all. Her weight is up 7 pounds since hospitalization. She was given IV fluids in the hospital. She is fluid overloaded on exam today. She has +1 pitting edema in both legs and by basilar crackles in both lungs. She reports weakness and lethargy. However she denies confusion or delirium.  At that time, my plan was:  I will check a CMP today to monitor her renal function. Baseline creatinine is between 2 and 2.5. I do believe she is fluid overloaded on examination today. I have advised her to continue the torsemide 20 mg by mouth twice a day. She is already lost 6 pounds since discharge from the hospital. Blood pressure is stable today. We will continue her current dose of metoprolol. I will check a urine culture and urinalysis to ensure resolution of the urinary tract infection. I will also schedule her to follow-up with her cardiologist, Dr. Einar Gip.   Recheck next week.  07/04/14 Patient has since seen Dr. Einar Gip.  He started her on Zaroxolyn for edema in addition to her Lasix. Overall the patient seems to improve. Her energy is better now that she has had adequate treatment for urinary tract infection. Her weight is stable. She continues to have pitting edema in both legs. The crackles in her lungs are slightly diminished. Overall she is starting to feel better. Unfortunately her sugars have been ranging 250-300 on 12 units of Lantus twice daily.  On recent lab work her alkaline phosphatase levels continue to remain elevated. GGT was mildly elevated in the past however a right upper quadrant ultrasound was normal.  She continues to have migratory bone pain in her shoulders and in her elbows and her legs that as of yet has no definitive cause. The pain in her shoulder did not improve after the cortisone injection. It does seem to be responding to Dilaudid. She also has a superficial thrombophlebitis in the vein on her left forehead. This seems to be improving with time and with warm compresses. Past Medical History  Diagnosis Date  . GERD (gastroesophageal reflux disease)   . PUD (peptic ulcer disease)   . Anemia   . Hypertension   . S/P endoscopy Aug 2011    3 superficial gastric ulcers, NSAID-induced  .  S/P colonoscopy Sept 2011    left-sided diverticula, tubular adenoma  . Coronary artery disease   . Shortness of breath   . Diabetes mellitus     insulin dependent  . Headache(784.0)     rare migraines  . Cancer     hx of skin cancer  . Arthritis   . Osteopenia   . Hypercholesterolemia   . SIADH (syndrome of inappropriate ADH production)   . CHF (congestive heart failure)   . Myocardial infarction 2013  . Chronic renal failure, stage 3 (moderate)    Past Surgical History  Procedure Laterality Date  . Tonsillectomy    . Appendectomy    . Eye surgery      cataracts  . Cardiac stents  07/19/2011  . Esophagogastroduodenoscopy  10/16/09    normal without barrett's/three superficial gastric ulcers  . Colonoscopy  12/04/09    normal rectum/left-sided diverticula  . Joint replacement  arthroscopy to knee  . Toe amputation Left 2013    left second toe amputation  . Cardiac catheterization  01/22/2013  . Coronary angioplasty  07/2011  . Coronary artery bypass graft N/A 11/08/2013    Procedure: CORONARY ARTERY BYPASS GRAFTING (CABG), on pump, times two, using left internal mammary artery, cryo saphenous vein.;  Surgeon: Ivin Poot, MD;  Location: [place];  Service: Open Heart Surgery;  Laterality: N/A;  LIMA-LAD CRYOVEIN -OM  . Intraoperative transesophageal echocardiogram N/A 11/08/2013     Procedure: INTRAOPERATIVE TRANSESOPHAGEAL ECHOCARDIOGRAM;  Surgeon: Ivin Poot, MD;  Location: [place];  Service: Open Heart Surgery;  Laterality: N/A;  . Mitral valve replacement N/A 11/08/2013    Procedure: MITRAL VALVE (MV) REPLACEMENT;  Surgeon: Ivin Poot, MD;  Location: [place];  Service: Open Heart Surgery;  Laterality: N/A;  #25 MAGNA MITRAL EASE  . Left heart catheterization with coronary angiogram N/A 07/19/2011    Procedure: LEFT HEART CATHETERIZATION WITH CORONARY ANGIOGRAM;  Surgeon: Laverda Page, MD;  Location: [facility] CATH LAB;  Service: Cardiovascular;  Laterality: N/A;  . Left heart catheterization with coronary angiogram N/A 12/21/2011    Procedure: LEFT HEART CATHETERIZATION WITH CORONARY ANGIOGRAM;  Surgeon: Laverda Page, MD;  Location: [facility] CATH LAB;  Service: Cardiovascular;  Laterality: N/A;  . Left heart catheterization with coronary angiogram N/A 02/20/2012    Procedure: LEFT HEART CATHETERIZATION WITH CORONARY ANGIOGRAM;  Surgeon: Laverda Page, MD;  Location: [facility] CATH LAB;  Service: Cardiovascular;  Laterality: N/A;  . Left heart catheterization with coronary angiogram N/A 09/03/2012    Procedure: LEFT HEART CATHETERIZATION WITH CORONARY ANGIOGRAM;  Surgeon: Laverda Page, MD;  Location: [facility] CATH LAB;  Service: Cardiovascular;  Laterality: N/A;  . Percutaneous coronary stent intervention (pci-s)  09/03/2012    Procedure: PERCUTANEOUS CORONARY STENT INTERVENTION (PCI-S);  Surgeon: Laverda Page, MD;  Location: [facility] CATH LAB;  Service: Cardiovascular;;  . Left heart catheterization with coronary angiogram N/A 12/04/2012    Procedure: LEFT HEART CATHETERIZATION WITH CORONARY ANGIOGRAM;  Surgeon: Laverda Page, MD;  Location: [facility] CATH LAB;  Service: Cardiovascular;  Laterality: N/A;  . Left heart catheterization with coronary angiogram N/A 01/22/2013    Procedure: LEFT HEART CATHETERIZATION WITH CORONARY ANGIOGRAM;  Surgeon: Laverda Page, MD;  Location: [facility]  CATH LAB;  Service: Cardiovascular;  Laterality: N/A;  . Left heart catheterization with coronary angiogram N/A 06/26/2013    Procedure: LEFT HEART CATHETERIZATION WITH CORONARY ANGIOGRAM;  Surgeon: Laverda Page, MD;  Location: [facility] CATH LAB;  Service: Cardiovascular;  Laterality: N/A;  . Left heart catheterization with coronary angiogram N/A 11/05/2013    Procedure: LEFT HEART CATHETERIZATION WITH CORONARY ANGIOGRAM;  Surgeon: Laverda Page, MD;  Location: [facility] CATH LAB;  Service: Cardiovascular;  Laterality: N/A;   Current Outpatient Prescriptions on File Prior to Visit  Medication Sig Dispense Refill  . ACCU-CHEK FASTCLIX LANCETS MISC Check BS QID - Fasting, before meals and QHS 100 each 5  . acetaminophen (TYLENOL) 500 MG tablet Take 2 tablets (1,000 mg total) by mouth every 6 (six) hours as needed. 60 tablet 11  . ascorbic acid (VITAMIN C) 500 MG tablet Take 1 tablet (500 mg total) by mouth 2 (two) times daily. 60 tablet 11  . aspirin 81 MG chewable tablet Chew 2 tablets (162 mg total) by mouth daily. 60 tablet 11  . B-D ULTRAFINE III SHORT PEN 31G X 8 MM MISC 5 each by Does not apply route 5 (five) times daily. 150 each 11  . Blood Glucose Monitoring Suppl (ACCU-CHEK AVIVA) device Use as instructed 1 each 0  . cetirizine (ZYRTEC) 10 MG tablet Take 1 tablet (10 mg total) by mouth at bedtime. 30 tablet 11  . feeding supplement, GLUCERNA SHAKE, (GLUCERNA SHAKE) LIQD Take 237 mLs by mouth 3 (three) times daily with meals.  0  . ferrous sulfate 325 (65 FE) MG tablet Take 1 tablet (325 mg total) by mouth 2 (two) times daily with a meal. 60 tablet 11  . fluticasone (FLONASE) 50 MCG/ACT nasal spray Place 1 spray into both nostrils daily. 16 g 5  . fosfomycin (MONUROL) 3 G PACK Take 3 g by mouth every 3 (three) days. 3 g 0  . glucose blood (ACCU-CHEK SMARTVIEW) test strip Check BS QID - Dx: E11.9 100 each 12  . HYDROmorphone (DILAUDID) 2 MG tablet Take 1 tablet (2 mg total) by mouth every 4 (four)  hours as needed for severe pain. 30 tablet 0  . insulin aspart (NOVOLOG FLEXPEN) 100 UNIT/ML FlexPen 70-120=0    251-300=5 121-150=1  301-350=7 151-200=2  351-400=9 201-250=3  400 or >, call MD  units of insulin (Patient taking differently: Inject 0-10 Units into the skin 3 (three) times daily with meals. 70-120=0    251-300=5 121-150=1  301-350=7 151-200=2  351-400=9 201-250=3  400 or >, call MD  units of insulin) 15 mL 11  . Insulin Glargine (LANTUS) 100 UNIT/ML Solostar Pen Inject 12 Units into the skin 2 (two) times daily. 15 mL 5  . ipratropium-albuterol (DUONEB) 0.5-2.5 (3) MG/3ML SOLN Take 3 mLs by nebulization every 6 (six) hours as needed. 360 mL 1  . Melatonin 3 MG TABS Take 1.5 mg by mouth at bedtime as needed (insomnia).     . metoprolol tartrate (LOPRESSOR) 25 MG tablet Take 1 tablet (25 mg total) by mouth 2 (two) times daily. 60 tablet 3  . nitroGLYCERIN (NITROSTAT) 0.4 MG SL tablet Place 1 tablet (0.4 mg total) under the tongue every 5 (five) minutes as needed for chest pain. 25 tablet 2  . ondansetron (ZOFRAN) 4 MG tablet Take 1 tablet (4 mg total) by mouth every 6 (six) hours as needed for nausea or vomiting. 30 tablet 3  . oxyCODONE (ROXICODONE) 5 MG immediate release tablet Take 0.5 tablets (2.5 mg total) by mouth every 4 (four) hours as needed for severe pain. 30 tablet 0  . pantoprazole (PROTONIX) 40 MG tablet Take 1 tablet (40 mg total) by mouth daily. 30 tablet 3  . polyethylene glycol (MIRALAX / GLYCOLAX)  packet Take 17 g by mouth every other day. 24 each 11  . sennosides-docusate sodium (SENOKOT-S) 8.6-50 MG tablet Take 1 tablet by mouth 2 (two) times daily. 60 tablet 11  . simvastatin (ZOCOR) 20 MG tablet Take 1 tablet (20 mg total) by mouth daily. 30 tablet 5  . sodium chloride 1 G tablet Take 1 g by mouth 2 (two) times daily.    Marland Kitchen torsemide (DEMADEX) 20 MG tablet Take 1 tablet (20 mg total) by mouth 2 (two) times daily. 60 tablet 11  . traZODone (DESYREL) 50 MG  tablet Take 1 tablet (50 mg total) by mouth at bedtime. 30 tablet 2   No current facility-administered medications on file prior to visit.   Allergies  Allergen Reactions  . Aspirin Other (See Comments)    High doses caused stomach bleeds   History   Social History  . Marital Status: Widowed    Spouse Name: N/A  . Number of Children: N/A  . Years of Education: N/A   Occupational History  . Not on file.   Social History Main Topics  . Smoking status: Never Smoker   . Smokeless tobacco: Never Used  . Alcohol Use: No  . Drug Use: No  . Sexual Activity: No   Other Topics Concern  . Not on file   Social History Narrative      Review of Systems  All other systems reviewed and are negative.      Objective:   Physical Exam  Constitutional: She appears well-developed. No distress.  Neck: Neck supple. No JVD present.  Cardiovascular: Normal rate, regular rhythm and normal heart sounds.   No murmur heard. Pulmonary/Chest: Effort normal. She has no rales.  Abdominal: Soft. Bowel sounds are normal. She exhibits no distension and no mass. There is no tenderness. There is no rebound and no guarding.  Musculoskeletal: She exhibits edema.  Vitals reviewed.         Assessment & Plan:  Weakness  IDDM (insulin dependent diabetes mellitus)  Elevated alkaline phosphatase level  Patient now seems stable. She has severe chronic underlying medical conditions but at the present time they're stable. Her blood pressures adequate. Her edema seems to be slowly improving. Hopefully with the addition of Zaroxolyn the edema will improve further. I asked the patient to increase her Lantus to 18 units twice daily and call me with the sugar readings next week. I will like her blood sugars to be between 102 100 given her frailty. Her urinary tract infection was confirmed to be completely treated on her last urine sample. The patient seems slowly to be improving. Therefore we discussed the  elevated alkaline phosphatase level. Given her migratory bone pain, I am concerned about possible skeletal base malignancy such as multiple myeloma or other condition such as Paget's disease. I recommended a bone scan. The patient would like to consider this with her son and notify me next week if she wants me to schedule the cause due to her age and her frailty and how poorly she feels, she is not sure that she would want to pursue any further workup.

## 2014-07-08 ENCOUNTER — Other Ambulatory Visit: Payer: Self-pay | Admitting: Family Medicine

## 2014-07-08 DIAGNOSIS — F339 Major depressive disorder, recurrent, unspecified: Secondary | ICD-10-CM | POA: Diagnosis not present

## 2014-07-08 DIAGNOSIS — E119 Type 2 diabetes mellitus without complications: Secondary | ICD-10-CM | POA: Diagnosis not present

## 2014-07-08 DIAGNOSIS — I10 Essential (primary) hypertension: Secondary | ICD-10-CM | POA: Diagnosis not present

## 2014-07-08 DIAGNOSIS — N184 Chronic kidney disease, stage 4 (severe): Secondary | ICD-10-CM | POA: Diagnosis not present

## 2014-07-08 DIAGNOSIS — I129 Hypertensive chronic kidney disease with stage 1 through stage 4 chronic kidney disease, or unspecified chronic kidney disease: Secondary | ICD-10-CM | POA: Diagnosis not present

## 2014-07-08 DIAGNOSIS — I4891 Unspecified atrial fibrillation: Secondary | ICD-10-CM | POA: Diagnosis not present

## 2014-07-08 DIAGNOSIS — M6281 Muscle weakness (generalized): Secondary | ICD-10-CM | POA: Diagnosis not present

## 2014-07-08 DIAGNOSIS — I509 Heart failure, unspecified: Secondary | ICD-10-CM | POA: Diagnosis not present

## 2014-07-08 DIAGNOSIS — D649 Anemia, unspecified: Secondary | ICD-10-CM | POA: Diagnosis not present

## 2014-07-08 NOTE — Telephone Encounter (Signed)
?   OK to Refill  

## 2014-07-09 ENCOUNTER — Other Ambulatory Visit: Payer: Self-pay | Admitting: Family Medicine

## 2014-07-09 DIAGNOSIS — I509 Heart failure, unspecified: Secondary | ICD-10-CM | POA: Diagnosis not present

## 2014-07-09 DIAGNOSIS — R269 Unspecified abnormalities of gait and mobility: Secondary | ICD-10-CM | POA: Diagnosis not present

## 2014-07-09 DIAGNOSIS — I890 Lymphedema, not elsewhere classified: Secondary | ICD-10-CM | POA: Diagnosis not present

## 2014-07-09 DIAGNOSIS — I2510 Atherosclerotic heart disease of native coronary artery without angina pectoris: Secondary | ICD-10-CM | POA: Diagnosis not present

## 2014-07-09 NOTE — Telephone Encounter (Signed)
?   OK to Refill  

## 2014-07-09 NOTE — Telephone Encounter (Signed)
Patient's son calling to ask for refill on her oxycodone if possible  [phone]

## 2014-07-10 MED ORDER — HYDROMORPHONE HCL 2 MG PO TABS: 2.0000 mg | ORAL_TABLET | ORAL | Status: DC | PRN

## 2014-07-10 NOTE — Telephone Encounter (Signed)
The wrong medication was requested in note - he/she needs a refill on Dilaudid - she is not taking Oxycodone. Also she would like to have the bone scan done. (What do I need to order?)

## 2014-07-10 NOTE — Telephone Encounter (Signed)
I thought she was on dilaudid?  I am ok with refill but I believe her last pain pill was dilaudid.  What did they decide on bone scan?

## 2014-07-14 ENCOUNTER — Encounter: Payer: Self-pay | Admitting: Family Medicine

## 2014-07-14 ENCOUNTER — Ambulatory Visit (INDEPENDENT_AMBULATORY_CARE_PROVIDER_SITE_OTHER): Payer: Commercial Managed Care - HMO | Admitting: Family Medicine

## 2014-07-14 VITALS — BP 150/60 | HR 60 | Temp 98.2°F | Resp 12

## 2014-07-14 DIAGNOSIS — R410 Disorientation, unspecified: Secondary | ICD-10-CM | POA: Diagnosis not present

## 2014-07-14 DIAGNOSIS — R748 Abnormal levels of other serum enzymes: Secondary | ICD-10-CM

## 2014-07-14 DIAGNOSIS — N390 Urinary tract infection, site not specified: Secondary | ICD-10-CM

## 2014-07-14 DIAGNOSIS — R0602 Shortness of breath: Secondary | ICD-10-CM | POA: Diagnosis not present

## 2014-07-14 LAB — URINALYSIS, ROUTINE W REFLEX MICROSCOPIC
Bilirubin Urine: NEGATIVE
Glucose, UA: NEGATIVE mg/dL
Hgb urine dipstick: NEGATIVE
Ketones, ur: NEGATIVE mg/dL
NITRITE: NEGATIVE
PH: 6 (ref 5.0–8.0)
Protein, ur: NEGATIVE mg/dL
Urobilinogen, UA: 0.2 mg/dL (ref 0.0–1.0)

## 2014-07-14 LAB — URINALYSIS, MICROSCOPIC ONLY
Casts: NONE SEEN
Crystals: NONE SEEN

## 2014-07-14 MED ORDER — FOSFOMYCIN TROMETHAMINE 3 G PO PACK: PACK | ORAL | Status: DC

## 2014-07-14 NOTE — Addendum Note (Signed)
Addended by: Haskel Khan A on: 07/14/2014 04:25 PM   Modules accepted: Orders

## 2014-07-14 NOTE — Progress Notes (Signed)
Subjective:    Patient ID: Claudia [name], female    DOB: 1931-09-03, 81 y.o.   MRN: [id number]  HPI 06/26/14  please see my last office visits.   Patient developed a pan resistant urinary tract infection with Klebsiella.   She failed outpatient management with Macrobid which had only intermediate sensitivities. Therefore I recommended she go to the hospital for IV antibiotics. I have copied relevant portions of the discharge summary and included them below for my reference:  Admit date: 06/17/2014 Discharge date: 06/20/2014  Time spent: 35 minutes  Recommendations for Outpatient Follow-up:  1. Follow-up with PCP in 2 weeks.  Discharge Diagnoses:  Principal Problem:  Infection with ESBL Klebsiella oxytoca Active Problems:  Acute encephalopathy  DM (diabetes mellitus), type 2  CKD (chronic kidney disease) stage 4, GFR 15-29 ml/min  Elevated troponin  UTI (lower urinary tract infection)   Discharge Condition: Stable  Diet recommendation: heart healthy  Filed Weights   06/17/14 1718 06/17/14 2109  Weight: 83.008 kg (183 lb) 85.095 kg (187 lb 9.6 oz)    History of present illness:  81 year old female who has a past medical history of GERD (gastroesophageal reflux disease); PUD (peptic ulcer disease); Anemia; Hypertension; S/P endoscopy (Aug 2011); S/P colonoscopy (Sept 2011); Coronary artery disease; Shortness of breath; Diabetes mellitus; Headache(784.0); Cancer; Arthritis; Osteopenia; Hypercholesterolemia; SIADH (syndrome of inappropriate ADH production); CHF (congestive heart failure); Myocardial infarction (2013); and Chronic renal failure, stage 3 (moderate). Today was brought to the hospital by patient's son for worsening lethargy and weakness. As per patient's son she was diagnosed with UTI last week and started on Cipro, the antibiotics were changed by PCP as per patient's son as Cipro did not work. Patient continues to feel generalized weakness, lethargy.  She denies dysuria but complains of taking long time to empty the bladder. Patient has been having episodic memory loss and intermittently not responding during the day. As per patient's son this has been going on for a while. She does not have a history of stroke or seizures in the past. Patient denies any chest pain or shortness of breath no nausea vomiting or diarrhea.  Hospital Course:  Acute encephalopathy/?due to UTI (lower urinary tract infection) with ESBL Klebsiella oxytoca: - She's had 2 previous urine cultures that show Klebsiella oxytoca which is sensitive to imipenem and fosfomycin - Afebrile, no leukocytosis.  - she will cont Fosfomycin for 3 additional days. - PT rec home health.  Hyponatremia: - Seems to be hypovolemic hyponatremia, basic metabolic panel showed improved. - Treated with IV hydration.  Acute on chronic kidney disease stage III/diabetic nephropathy: - Unclear etiology seems to be hypovolemia, she was started on IV fluids and her creatinine has slowly improved. - baseline Creatinine 1.5-1.9  Elevated troponin: - Resolved with hydration she continues to be asymptomatic. - Echocardiogram shows normal motion abnormality.  DM (diabetes mellitus), type 2: - no changes were made to her regimen.  Chronic diastolic heart failure: - Stable, seems to be euvolemic. KVO IV fluids.  History of aortic bioprosthetic valve replacement:  Mild pulmonary hypertension by echocardiogram: Follow-up with cardiology is no patient.  Elevated LFTs: - Showed no acute findings, mostly due to UTI.  Procedures: Abdominal ultrasound 2-D echo Chest x-ray Consultations:  none  Discharge Exam: Filed Vitals:   06/20/14 0535  BP: 119/49  Pulse: 60  Temp: 98 F (36.7 C)  Resp: 20    General: A&O x3 Cardiovascular: RRR Respiratory: good air movement CTA B/L  Discharge Instructions  Discharge Instructions    Diet - low sodium heart healthy   Complete by: As directed      Increase activity slowly  Complete by: As directed           Current Discharge Medication List    START taking these medications   Details  fosfomycin (MONUROL) 3 G PACK Take 3 g by mouth every 3 (three) days. Qty: 3 g, Refills: 0      CONTINUE these medications which have NOT CHANGED   Details  acetaminophen (TYLENOL) 500 MG tablet Take 2 tablets (1,000 mg total) by mouth every 6 (six) hours as needed. Qty: 60 tablet, Refills: 11    ascorbic acid (VITAMIN C) 500 MG tablet Take 1 tablet (500 mg total) by mouth 2 (two) times daily. Qty: 60 tablet, Refills: 11    aspirin 81 MG chewable tablet Chew 2 tablets (162 mg total) by mouth daily. Qty: 60 tablet, Refills: 11    cetirizine (ZYRTEC) 10 MG tablet Take 1 tablet (10 mg total) by mouth at bedtime. Qty: 30 tablet, Refills: 11    feeding supplement, GLUCERNA SHAKE, (GLUCERNA SHAKE) LIQD Take 237 mLs by mouth 3 (three) times daily with meals. Refills: 0    ferrous sulfate 325 (65 FE) MG tablet Take 1 tablet (325 mg total) by mouth 2 (two) times daily with a meal. Qty: 60 tablet, Refills: 11    fluticasone (FLONASE) 50 MCG/ACT nasal spray Place 1 spray into both nostrils daily. Qty: 16 g, Refills: 5    insulin aspart (NOVOLOG FLEXPEN) 100 UNIT/ML FlexPen 70-120=0 251-300=5 121-150=1 301-350=7 151-200=2 351-400=9 201-250=3 400 or >, call MD units of insulin Qty: 15 mL, Refills: 11    Insulin Glargine (LANTUS) 100 UNIT/ML Solostar Pen Inject 12 Units into the skin 2 (two) times daily. Qty: 15 mL, Refills: 5    ipratropium-albuterol (DUONEB) 0.5-2.5 (3) MG/3ML SOLN Take 3 mLs by nebulization every 6 (six) hours as needed. Qty: 360 mL, Refills: 1    Melatonin 3 MG TABS Take 1.5 mg by mouth at bedtime as needed (insomnia).     metoprolol tartrate (LOPRESSOR) 25 MG tablet Take 1 tablet (25 mg total) by mouth 2 (two) times  daily. Qty: 60 tablet, Refills: 3    nitroGLYCERIN (NITROSTAT) 0.4 MG SL tablet Place 1 tablet (0.4 mg total) under the tongue every 5 (five) minutes as needed for chest pain. Qty: 25 tablet, Refills: 2    ondansetron (ZOFRAN) 4 MG tablet Take 1 tablet (4 mg total) by mouth every 6 (six) hours as needed for nausea or vomiting. Qty: 30 tablet, Refills: 3    oxyCODONE (ROXICODONE) 5 MG immediate release tablet Take 0.5 tablets (2.5 mg total) by mouth every 4 (four) hours as needed for severe pain. Qty: 30 tablet, Refills: 0   Associated Diagnoses: Pain in joint, shoulder region, unspecified laterality    pantoprazole (PROTONIX) 40 MG tablet Take 1 tablet (40 mg total) by mouth daily. Qty: 30 tablet, Refills: 3    polyethylene glycol (MIRALAX / GLYCOLAX) packet Take 17 g by mouth every other day. Qty: 24 each, Refills: 11    sodium chloride 1 G tablet Take 1 g by mouth 2 (two) times daily.    torsemide (DEMADEX) 20 MG tablet Take 1 tablet (20 mg total) by mouth 2 (two) times daily. Qty: 60 tablet, Refills: 11    traZODone (DESYREL) 50 MG tablet Take 1 tablet (50 mg total) by mouth at bedtime. Qty: 30 tablet, Refills: 2  ACCU-CHEK FASTCLIX LANCETS MISC Check BS QID - Fasting, before meals and QHS Qty: 100 each, Refills: 5    B-D ULTRAFINE III SHORT PEN 31G X 8 MM MISC 5 each by Does not apply route 5 (five) times daily. Qty: 150 each, Refills: 11    Blood Glucose Monitoring Suppl (ACCU-CHEK AVIVA) device Use as instructed Qty: 1 each, Refills: 0    glucose blood (ACCU-CHEK SMARTVIEW) test strip Check BS QID - Dx: E11.9 Qty: 100 each, Refills: 12    sennosides-docusate sodium (SENOKOT-S) 8.6-50 MG tablet Take 1 tablet by mouth 2 (two) times daily. Qty: 60 tablet, Refills: 11    simvastatin (ZOCOR) 20 MG tablet Take 1 tablet (20 mg total) by mouth daily. Qty: 30 tablet, Refills: 5          patient  Looks like she feels poorly.  She is getting increasingly weaker. She denies any chest pain or shortness of breath however she continues to have severe pain in her left shoulder. Initial x-rays of the shoulder were negative. Cold weather as an exacerbating factor to the shoulder pain. Movement does not seem to aggravate the shoulder pain. At times the pain will radiate into her left elbow. Oxycodone does not seem to help the pain at all. Her weight is up 7 pounds since hospitalization. She was given IV fluids in the hospital. She is fluid overloaded on exam today. She has +1 pitting edema in both legs and by basilar crackles in both lungs. She reports weakness and lethargy. However she denies confusion or delirium.  At that time, my plan was:  I will check a CMP today to monitor her renal function. Baseline creatinine is between 2 and 2.5. I do believe she is fluid overloaded on examination today. I have advised her to continue the torsemide 20 mg by mouth twice a day. She is already lost 6 pounds since discharge from the hospital. Blood pressure is stable today. We will continue her current dose of metoprolol. I will check a urine culture and urinalysis to ensure resolution of the urinary tract infection. I will also schedule her to follow-up with her cardiologist, Dr. Einar Gip.   Recheck next week.  07/04/14 Patient has since seen Dr. Einar Gip.  He started her on Zaroxolyn for edema in addition to her Lasix. Overall the patient seems to improve. Her energy is better now that she has had adequate treatment for urinary tract infection. Her weight is stable. She continues to have pitting edema in both legs. The crackles in her lungs are slightly diminished. Overall she is starting to feel better. Unfortunately her sugars have been ranging 250-300 on 12 units of Lantus twice daily.  On recent lab work her alkaline phosphatase levels continue to remain elevated. GGT was mildly elevated in the past however a right upper quadrant ultrasound was normal.  She continues to have migratory bone pain in her shoulders and in her elbows and her legs that as of yet has no definitive cause. The pain in her shoulder did not improve after the cortisone injection. It does seem to be responding to Dilaudid. She also has a superficial thrombophlebitis in the vein on her left forehead. This seems to be improving with time and with warm compresses.  At that time, my plan was: Patient now seems stable. She has severe chronic underlying medical conditions but at the present time they're stable. Her blood pressures adequate. Her edema seems to be slowly improving. Hopefully with the addition of Zaroxolyn the  edema will improve further. I asked the patient to increase her Lantus to 18 units twice daily and call me with the sugar readings next week. I will like her blood sugars to be between 100-200 given her frailty. Her urinary tract infection was confirmed to be completely treated on her last urine sample. The patient seems slowly to be improving. Therefore we discussed the elevated alkaline phosphatase level. Given her migratory bone pain, I am concerned about possible skeletal- based malignancy such as multiple myeloma or other condition such as Paget's disease. I recommended a bone scan. The patient would like to consider this with her son and notify me next week if she wants me to schedule cause due to her age and her frailty and how poorly she feels, she is not sure that she would want to pursue any further workup.  07/14/14 Patient is here with her son.   He states that she has been more confused Sat.  She is disoriented, lethargic, not eating well.  On Mini-Mental status exam today, the patient does not know the date or the year. She does know my name and the location. She is unable to spell world backward. She is unable to remember 3 objects on recall, she can only remember one. She is unable to perform serial sevens. There is no evidence of neurologic deficit. There is no  evidence of a recent stroke. She denies any word finding aphasia. There is been no facial droop or slurred speech Past Medical History  Diagnosis Date  . GERD (gastroesophageal reflux disease)   . PUD (peptic ulcer disease)   . Anemia   . Hypertension   . S/P endoscopy Aug 2011    3 superficial gastric ulcers, NSAID-induced  . S/P colonoscopy Sept 2011    left-sided diverticula, tubular adenoma  . Coronary artery disease   . Shortness of breath   . Diabetes mellitus     insulin dependent  . Headache(784.0)     rare migraines  . Cancer     hx of skin cancer  . Arthritis   . Osteopenia   . Hypercholesterolemia   . SIADH (syndrome of inappropriate ADH production)   . CHF (congestive heart failure)   . Myocardial infarction 2013  . Chronic renal failure, stage 3 (moderate)    Past Surgical History  Procedure Laterality Date  . Tonsillectomy    . Appendectomy    . Eye surgery      cataracts  . Cardiac stents  07/19/2011  . Esophagogastroduodenoscopy  10/16/09    normal without barrett's/three superficial gastric ulcers  . Colonoscopy  12/04/09    normal rectum/left-sided diverticula  . Joint replacement  arthroscopy to knee  . Toe amputation Left 2013    left second toe amputation  . Cardiac catheterization  01/22/2013  . Coronary angioplasty  07/2011  . Coronary artery bypass graft N/A 11/08/2013    Procedure: CORONARY ARTERY BYPASS GRAFTING (CABG), on pump, times two, using left internal mammary artery, cryo saphenous vein.;  Surgeon: Ivin Poot, MD;  Location: [place];  Service: Open Heart Surgery;  Laterality: N/A;  LIMA-LAD CRYOVEIN -OM  . Intraoperative transesophageal echocardiogram N/A 11/08/2013    Procedure: INTRAOPERATIVE TRANSESOPHAGEAL ECHOCARDIOGRAM;  Surgeon: Ivin Poot, MD;  Location: [place];  Service: Open Heart Surgery;  Laterality: N/A;  . Mitral valve replacement N/A 11/08/2013    Procedure: MITRAL VALVE (MV) REPLACEMENT;  Surgeon: Ivin Poot, MD;   Location: [place];  Service: Open Heart  Surgery;  Laterality: N/A;  #25 MAGNA MITRAL EASE  . Left heart catheterization with coronary angiogram N/A 07/19/2011    Procedure: LEFT HEART CATHETERIZATION WITH CORONARY ANGIOGRAM;  Surgeon: Laverda Page, MD;  Location: [facility] CATH LAB;  Service: Cardiovascular;  Laterality: N/A;  . Left heart catheterization with coronary angiogram N/A 12/21/2011    Procedure: LEFT HEART CATHETERIZATION WITH CORONARY ANGIOGRAM;  Surgeon: Laverda Page, MD;  Location: [facility] CATH LAB;  Service: Cardiovascular;  Laterality: N/A;  . Left heart catheterization with coronary angiogram N/A 02/20/2012    Procedure: LEFT HEART CATHETERIZATION WITH CORONARY ANGIOGRAM;  Surgeon: Laverda Page, MD;  Location: [facility] CATH LAB;  Service: Cardiovascular;  Laterality: N/A;  . Left heart catheterization with coronary angiogram N/A 09/03/2012    Procedure: LEFT HEART CATHETERIZATION WITH CORONARY ANGIOGRAM;  Surgeon: Laverda Page, MD;  Location: [facility] CATH LAB;  Service: Cardiovascular;  Laterality: N/A;  . Percutaneous coronary stent intervention (pci-s)  09/03/2012    Procedure: PERCUTANEOUS CORONARY STENT INTERVENTION (PCI-S);  Surgeon: Laverda Page, MD;  Location: [facility] CATH LAB;  Service: Cardiovascular;;  . Left heart catheterization with coronary angiogram N/A 12/04/2012    Procedure: LEFT HEART CATHETERIZATION WITH CORONARY ANGIOGRAM;  Surgeon: Laverda Page, MD;  Location: [facility] CATH LAB;  Service: Cardiovascular;  Laterality: N/A;  . Left heart catheterization with coronary angiogram N/A 01/22/2013    Procedure: LEFT HEART CATHETERIZATION WITH CORONARY ANGIOGRAM;  Surgeon: Laverda Page, MD;  Location: [facility] CATH LAB;  Service: Cardiovascular;  Laterality: N/A;  . Left heart catheterization with coronary angiogram N/A 06/26/2013    Procedure: LEFT HEART CATHETERIZATION WITH CORONARY ANGIOGRAM;  Surgeon: Laverda Page, MD;  Location: [facility] CATH LAB;  Service: Cardiovascular;   Laterality: N/A;  . Left heart catheterization with coronary angiogram N/A 11/05/2013    Procedure: LEFT HEART CATHETERIZATION WITH CORONARY ANGIOGRAM;  Surgeon: Laverda Page, MD;  Location: [facility] CATH LAB;  Service: Cardiovascular;  Laterality: N/A;   Current Outpatient Prescriptions on File Prior to Visit  Medication Sig Dispense Refill  . ACCU-CHEK FASTCLIX LANCETS MISC Check BS QID - Fasting, before meals and QHS 100 each 5  . acetaminophen (TYLENOL) 500 MG tablet Take 2 tablets (1,000 mg total) by mouth every 6 (six) hours as needed. 60 tablet 11  . ascorbic acid (VITAMIN C) 500 MG tablet Take 1 tablet (500 mg total) by mouth 2 (two) times daily. 60 tablet 11  . aspirin 81 MG chewable tablet Chew 2 tablets (162 mg total) by mouth daily. 60 tablet 11  . B-D ULTRAFINE III SHORT PEN 31G X 8 MM MISC 5 each by Does not apply route 5 (five) times daily. 150 each 11  . Blood Glucose Monitoring Suppl (ACCU-CHEK AVIVA) device Use as instructed 1 each 0  . cetirizine (ZYRTEC) 10 MG tablet Take 1 tablet (10 mg total) by mouth at bedtime. 30 tablet 11  . feeding supplement, GLUCERNA SHAKE, (GLUCERNA SHAKE) LIQD Take 237 mLs by mouth 3 (three) times daily with meals.  0  . ferrous sulfate 325 (65 FE) MG tablet Take 1 tablet (325 mg total) by mouth 2 (two) times daily with a meal. 60 tablet 11  . fluticasone (FLONASE) 50 MCG/ACT nasal spray Place 1 spray into both nostrils daily. 16 g 5  . fosfomycin (MONUROL) 3 G PACK Take 3 g by mouth every 3 (three) days. 3 g 0  . glucose blood (ACCU-CHEK SMARTVIEW) test strip Check BS QID - Dx: E11.9 100  each 12  . HYDROmorphone (DILAUDID) 2 MG tablet Take 1 tablet (2 mg total) by mouth every 4 (four) hours as needed for severe pain. 30 tablet 0  . insulin aspart (NOVOLOG FLEXPEN) 100 UNIT/ML FlexPen 70-120=0    251-300=5 121-150=1  301-350=7 151-200=2  351-400=9 201-250=3  400 or >, call MD  units of insulin (Patient taking differently: Inject 0-10 Units into the  skin 3 (three) times daily with meals. 70-120=0    251-300=5 121-150=1  301-350=7 151-200=2  351-400=9 201-250=3  400 or >, call MD  units of insulin) 15 mL 11  . Insulin Glargine (LANTUS) 100 UNIT/ML Solostar Pen Inject 12 Units into the skin 2 (two) times daily. 15 mL 5  . ipratropium-albuterol (DUONEB) 0.5-2.5 (3) MG/3ML SOLN Take 3 mLs by nebulization every 6 (six) hours as needed. 360 mL 1  . Melatonin 3 MG TABS Take 1.5 mg by mouth at bedtime as needed (insomnia).     . metolazone (ZAROXOLYN) 5 MG tablet     . metoprolol tartrate (LOPRESSOR) 25 MG tablet Take 1 tablet (25 mg total) by mouth 2 (two) times daily. 60 tablet 3  . nitroGLYCERIN (NITROSTAT) 0.4 MG SL tablet Place 1 tablet (0.4 mg total) under the tongue every 5 (five) minutes as needed for chest pain. 25 tablet 2  . ondansetron (ZOFRAN) 4 MG tablet Take 1 tablet (4 mg total) by mouth every 6 (six) hours as needed for nausea or vomiting. 30 tablet 3  . oxyCODONE (ROXICODONE) 5 MG immediate release tablet Take 0.5 tablets (2.5 mg total) by mouth every 4 (four) hours as needed for severe pain. 30 tablet 0  . pantoprazole (PROTONIX) 40 MG tablet Take 1 tablet (40 mg total) by mouth daily. 30 tablet 3  . polyethylene glycol (MIRALAX / GLYCOLAX) packet Take 17 g by mouth every other day. 24 each 11  . sennosides-docusate sodium (SENOKOT-S) 8.6-50 MG tablet Take 1 tablet by mouth 2 (two) times daily. 60 tablet 11  . simvastatin (ZOCOR) 20 MG tablet Take 1 tablet (20 mg total) by mouth daily. 30 tablet 5  . sodium chloride 1 G tablet Take 1 g by mouth 2 (two) times daily.    Marland Kitchen torsemide (DEMADEX) 20 MG tablet Take 1 tablet (20 mg total) by mouth 2 (two) times daily. 60 tablet 11  . traZODone (DESYREL) 50 MG tablet Take 1 tablet (50 mg total) by mouth at bedtime. 30 tablet 2   No current facility-administered medications on file prior to visit.   Allergies  Allergen Reactions  . Aspirin Other (See Comments)    High doses caused  stomach bleeds   History   Social History  . Marital Status: Widowed    Spouse Name: N/A  . Number of Children: N/A  . Years of Education: N/A   Occupational History  . Not on file.   Social History Main Topics  . Smoking status: Never Smoker   . Smokeless tobacco: Never Used  . Alcohol Use: No  . Drug Use: No  . Sexual Activity: No   Other Topics Concern  . Not on file   Social History Narrative      Review of Systems  All other systems reviewed and are negative.      Objective:   Physical Exam  Constitutional: She appears well-developed. No distress.  Neck: Neck supple. No JVD present.  Cardiovascular: Normal rate, regular rhythm and normal heart sounds.   No murmur heard. Pulmonary/Chest: Effort normal. She has  no rales.  Abdominal: Soft. Bowel sounds are normal. She exhibits no distension and no mass. There is no tenderness. There is no rebound and no guarding.  Musculoskeletal: She exhibits edema.  Vitals reviewed.         Assessment & Plan:  Acute delirium - Plan: fosfomycin (MONUROL) 3 G PACK, CBC with Differential/Platelet, COMPLETE METABOLIC PANEL WITH GFR, Urinalysis, Routine w reflex microscopic  I suspect that the patient has a urinary tract infection. Recently she has had a very resistant urinary tract infection. I will obtain a urinalysis, CBC, and a CMP. I'll start the patient on fosfomycin 3 g every 3 days  For 3 doses. If no better or no evidence of urinary tract infection, consider a TIA versus stroke. If no evidence on a head CT, consider underlying dementia. I will also schedule the patient for a bone scan given her persistently elevated alkaline phosphatase

## 2014-07-15 LAB — COMPLETE METABOLIC PANEL WITH GFR
ALBUMIN: 3.3 g/dL — AB (ref 3.5–5.2)
ALT: 16 U/L (ref 0–35)
AST: 21 U/L (ref 0–37)
Alkaline Phosphatase: 254 U/L — ABNORMAL HIGH (ref 39–117)
BUN: 63 mg/dL — AB (ref 6–23)
CALCIUM: 8.9 mg/dL (ref 8.4–10.5)
CO2: 27 meq/L (ref 19–32)
Chloride: 95 mEq/L — ABNORMAL LOW (ref 96–112)
Creat: 1.76 mg/dL — ABNORMAL HIGH (ref 0.50–1.10)
GFR, EST AFRICAN AMERICAN: 31 mL/min — AB
GFR, Est Non African American: 27 mL/min — ABNORMAL LOW
GLUCOSE: 77 mg/dL (ref 70–99)
Potassium: 4 mEq/L (ref 3.5–5.3)
SODIUM: 135 meq/L (ref 135–145)
Total Bilirubin: 1 mg/dL (ref 0.2–1.2)
Total Protein: 6.8 g/dL (ref 6.0–8.3)

## 2014-07-15 LAB — CBC WITH DIFFERENTIAL/PLATELET
BASOS ABS: 0.1 10*3/uL (ref 0.0–0.1)
Basophils Relative: 1 % (ref 0–1)
EOS ABS: 0.2 10*3/uL (ref 0.0–0.7)
EOS PCT: 3 % (ref 0–5)
HCT: 39.3 % (ref 36.0–46.0)
Hemoglobin: 12.8 g/dL (ref 12.0–15.0)
Lymphocytes Relative: 11 % — ABNORMAL LOW (ref 12–46)
Lymphs Abs: 0.7 10*3/uL (ref 0.7–4.0)
MCH: 27.9 pg (ref 26.0–34.0)
MCHC: 32.6 g/dL (ref 30.0–36.0)
MCV: 85.8 fL (ref 78.0–100.0)
MONO ABS: 0.7 10*3/uL (ref 0.1–1.0)
MPV: 9.7 fL (ref 8.6–12.4)
Monocytes Relative: 11 % (ref 3–12)
Neutro Abs: 4.8 10*3/uL (ref 1.7–7.7)
Neutrophils Relative %: 74 % (ref 43–77)
Platelets: 198 10*3/uL (ref 150–400)
RBC: 4.58 MIL/uL (ref 3.87–5.11)
RDW: 17 % — AB (ref 11.5–15.5)
WBC: 6.5 10*3/uL (ref 4.0–10.5)

## 2014-07-16 DIAGNOSIS — G4734 Idiopathic sleep related nonobstructive alveolar hypoventilation: Secondary | ICD-10-CM | POA: Diagnosis not present

## 2014-07-17 ENCOUNTER — Other Ambulatory Visit: Payer: Self-pay | Admitting: Family Medicine

## 2014-07-17 LAB — URINE CULTURE

## 2014-07-17 MED ORDER — CEPHALEXIN 500 MG PO CAPS: 500.0000 mg | ORAL_CAPSULE | Freq: Three times a day (TID) | ORAL | Status: DC

## 2014-07-18 ENCOUNTER — Ambulatory Visit (INDEPENDENT_AMBULATORY_CARE_PROVIDER_SITE_OTHER): Payer: Commercial Managed Care - HMO | Admitting: Family Medicine

## 2014-07-18 ENCOUNTER — Encounter: Payer: Self-pay | Admitting: Family Medicine

## 2014-07-18 VITALS — BP 160/68 | HR 74 | Temp 98.0°F | Resp 18 | Ht 67.0 in | Wt 169.0 lb

## 2014-07-18 DIAGNOSIS — I5020 Unspecified systolic (congestive) heart failure: Secondary | ICD-10-CM | POA: Diagnosis not present

## 2014-07-18 DIAGNOSIS — N390 Urinary tract infection, site not specified: Secondary | ICD-10-CM

## 2014-07-18 DIAGNOSIS — I509 Heart failure, unspecified: Secondary | ICD-10-CM | POA: Diagnosis not present

## 2014-07-18 DIAGNOSIS — R410 Disorientation, unspecified: Secondary | ICD-10-CM | POA: Diagnosis not present

## 2014-07-18 DIAGNOSIS — R748 Abnormal levels of other serum enzymes: Secondary | ICD-10-CM

## 2014-07-18 NOTE — Progress Notes (Signed)
Subjective:    Patient ID: Claudia [name], female    DOB: 1931/07/17, 81 y.o.   MRN: [id number]  HPI 06/26/14  please see my last office visits.   Patient developed a pan resistant urinary tract infection with Klebsiella.   She failed outpatient management with Macrobid which had only intermediate sensitivities. Therefore I recommended she go to the hospital for IV antibiotics. I have copied relevant portions of the discharge summary and included them below for my reference:  Admit date: 06/17/2014 Discharge date: 06/20/2014  Time spent: 35 minutes  Recommendations for Outpatient Follow-up:  1. Follow-up with PCP in 2 weeks.  Discharge Diagnoses:  Principal Problem:  Infection with ESBL Klebsiella oxytoca Active Problems:  Acute encephalopathy  DM (diabetes mellitus), type 2  CKD (chronic kidney disease) stage 4, GFR 15-29 ml/min  Elevated troponin  UTI (lower urinary tract infection)   Discharge Condition: Stable  Diet recommendation: heart healthy  Filed Weights   06/17/14 1718 06/17/14 2109  Weight: 83.008 kg (183 lb) 85.095 kg (187 lb 9.6 oz)    History of present illness:  81 year old female who has a past medical history of GERD (gastroesophageal reflux disease); PUD (peptic ulcer disease); Anemia; Hypertension; S/P endoscopy (Aug 2011); S/P colonoscopy (Sept 2011); Coronary artery disease; Shortness of breath; Diabetes mellitus; Headache(784.0); Cancer; Arthritis; Osteopenia; Hypercholesterolemia; SIADH (syndrome of inappropriate ADH production); CHF (congestive heart failure); Myocardial infarction (2013); and Chronic renal failure, stage 3 (moderate). Today was brought to the hospital by patient's son for worsening lethargy and weakness. As per patient's son she was diagnosed with UTI last week and started on Cipro, the antibiotics were changed by PCP as per patient's son as Cipro did not work. Patient continues to feel generalized weakness, lethargy.  She denies dysuria but complains of taking long time to empty the bladder. Patient has been having episodic memory loss and intermittently not responding during the day. As per patient's son this has been going on for a while. She does not have a history of stroke or seizures in the past. Patient denies any chest pain or shortness of breath no nausea vomiting or diarrhea.  Hospital Course:  Acute encephalopathy/?due to UTI (lower urinary tract infection) with ESBL Klebsiella oxytoca: - She's had 2 previous urine cultures that show Klebsiella oxytoca which is sensitive to imipenem and fosfomycin - Afebrile, no leukocytosis.  - she will cont Fosfomycin for 3 additional days. - PT rec home health.  Hyponatremia: - Seems to be hypovolemic hyponatremia, basic metabolic panel showed improved. - Treated with IV hydration.  Acute on chronic kidney disease stage III/diabetic nephropathy: - Unclear etiology seems to be hypovolemia, she was started on IV fluids and her creatinine has slowly improved. - baseline Creatinine 1.5-1.9  Elevated troponin: - Resolved with hydration she continues to be asymptomatic. - Echocardiogram shows normal motion abnormality.  DM (diabetes mellitus), type 2: - no changes were made to her regimen.  Chronic diastolic heart failure: - Stable, seems to be euvolemic. KVO IV fluids.  History of aortic bioprosthetic valve replacement:  Mild pulmonary hypertension by echocardiogram: Follow-up with cardiology is no patient.  Elevated LFTs: - Showed no acute findings, mostly due to UTI.  Procedures: Abdominal ultrasound 2-D echo Chest x-ray Consultations:  none  Discharge Exam: Filed Vitals:   06/20/14 0535  BP: 119/49  Pulse: 60  Temp: 98 F (36.7 C)  Resp: 20    General: A&O x3 Cardiovascular: RRR Respiratory: good air movement CTA B/L  Discharge Instructions  Discharge Instructions    Diet - low sodium heart healthy   Complete by: As directed      Increase activity slowly  Complete by: As directed           Current Discharge Medication List    START taking these medications   Details  fosfomycin (MONUROL) 3 G PACK Take 3 g by mouth every 3 (three) days. Qty: 3 g, Refills: 0      CONTINUE these medications which have NOT CHANGED   Details  acetaminophen (TYLENOL) 500 MG tablet Take 2 tablets (1,000 mg total) by mouth every 6 (six) hours as needed. Qty: 60 tablet, Refills: 11    ascorbic acid (VITAMIN C) 500 MG tablet Take 1 tablet (500 mg total) by mouth 2 (two) times daily. Qty: 60 tablet, Refills: 11    aspirin 81 MG chewable tablet Chew 2 tablets (162 mg total) by mouth daily. Qty: 60 tablet, Refills: 11    cetirizine (ZYRTEC) 10 MG tablet Take 1 tablet (10 mg total) by mouth at bedtime. Qty: 30 tablet, Refills: 11    feeding supplement, GLUCERNA SHAKE, (GLUCERNA SHAKE) LIQD Take 237 mLs by mouth 3 (three) times daily with meals. Refills: 0    ferrous sulfate 325 (65 FE) MG tablet Take 1 tablet (325 mg total) by mouth 2 (two) times daily with a meal. Qty: 60 tablet, Refills: 11    fluticasone (FLONASE) 50 MCG/ACT nasal spray Place 1 spray into both nostrils daily. Qty: 16 g, Refills: 5    insulin aspart (NOVOLOG FLEXPEN) 100 UNIT/ML FlexPen 70-120=0 251-300=5 121-150=1 301-350=7 151-200=2 351-400=9 201-250=3 400 or >, call MD units of insulin Qty: 15 mL, Refills: 11    Insulin Glargine (LANTUS) 100 UNIT/ML Solostar Pen Inject 12 Units into the skin 2 (two) times daily. Qty: 15 mL, Refills: 5    ipratropium-albuterol (DUONEB) 0.5-2.5 (3) MG/3ML SOLN Take 3 mLs by nebulization every 6 (six) hours as needed. Qty: 360 mL, Refills: 1    Melatonin 3 MG TABS Take 1.5 mg by mouth at bedtime as needed (insomnia).     metoprolol tartrate (LOPRESSOR) 25 MG tablet Take 1 tablet (25 mg total) by mouth 2 (two) times  daily. Qty: 60 tablet, Refills: 3    nitroGLYCERIN (NITROSTAT) 0.4 MG SL tablet Place 1 tablet (0.4 mg total) under the tongue every 5 (five) minutes as needed for chest pain. Qty: 25 tablet, Refills: 2    ondansetron (ZOFRAN) 4 MG tablet Take 1 tablet (4 mg total) by mouth every 6 (six) hours as needed for nausea or vomiting. Qty: 30 tablet, Refills: 3    oxyCODONE (ROXICODONE) 5 MG immediate release tablet Take 0.5 tablets (2.5 mg total) by mouth every 4 (four) hours as needed for severe pain. Qty: 30 tablet, Refills: 0   Associated Diagnoses: Pain in joint, shoulder region, unspecified laterality    pantoprazole (PROTONIX) 40 MG tablet Take 1 tablet (40 mg total) by mouth daily. Qty: 30 tablet, Refills: 3    polyethylene glycol (MIRALAX / GLYCOLAX) packet Take 17 g by mouth every other day. Qty: 24 each, Refills: 11    sodium chloride 1 G tablet Take 1 g by mouth 2 (two) times daily.    torsemide (DEMADEX) 20 MG tablet Take 1 tablet (20 mg total) by mouth 2 (two) times daily. Qty: 60 tablet, Refills: 11    traZODone (DESYREL) 50 MG tablet Take 1 tablet (50 mg total) by mouth at bedtime. Qty: 30 tablet, Refills: 2  ACCU-CHEK FASTCLIX LANCETS MISC Check BS QID - Fasting, before meals and QHS Qty: 100 each, Refills: 5    B-D ULTRAFINE III SHORT PEN 31G X 8 MM MISC 5 each by Does not apply route 5 (five) times daily. Qty: 150 each, Refills: 11    Blood Glucose Monitoring Suppl (ACCU-CHEK AVIVA) device Use as instructed Qty: 1 each, Refills: 0    glucose blood (ACCU-CHEK SMARTVIEW) test strip Check BS QID - Dx: E11.9 Qty: 100 each, Refills: 12    sennosides-docusate sodium (SENOKOT-S) 8.6-50 MG tablet Take 1 tablet by mouth 2 (two) times daily. Qty: 60 tablet, Refills: 11    simvastatin (ZOCOR) 20 MG tablet Take 1 tablet (20 mg total) by mouth daily. Qty: 30 tablet, Refills: 5          patient  Looks like she feels poorly.  She is getting increasingly weaker. She denies any chest pain or shortness of breath however she continues to have severe pain in her left shoulder. Initial x-rays of the shoulder were negative. Cold weather as an exacerbating factor to the shoulder pain. Movement does not seem to aggravate the shoulder pain. At times the pain will radiate into her left elbow. Oxycodone does not seem to help the pain at all. Her weight is up 7 pounds since hospitalization. She was given IV fluids in the hospital. She is fluid overloaded on exam today. She has +1 pitting edema in both legs and by basilar crackles in both lungs. She reports weakness and lethargy. However she denies confusion or delirium.  At that time, my plan was:  I will check a CMP today to monitor her renal function. Baseline creatinine is between 2 and 2.5. I do believe she is fluid overloaded on examination today. I have advised her to continue the torsemide 20 mg by mouth twice a day. She is already lost 6 pounds since discharge from the hospital. Blood pressure is stable today. We will continue her current dose of metoprolol. I will check a urine culture and urinalysis to ensure resolution of the urinary tract infection. I will also schedule her to follow-up with her cardiologist, Dr. Einar Gip.   Recheck next week.  07/04/14 Patient has since seen Dr. Einar Gip.  He started her on Zaroxolyn for edema in addition to her Lasix. Overall the patient seems to improve. Her energy is better now that she has had adequate treatment for urinary tract infection. Her weight is stable. She continues to have pitting edema in both legs. The crackles in her lungs are slightly diminished. Overall she is starting to feel better. Unfortunately her sugars have been ranging 250-300 on 12 units of Lantus twice daily.  On recent lab work her alkaline phosphatase levels continue to remain elevated. GGT was mildly elevated in the past however a right upper quadrant ultrasound was normal.  She continues to have migratory bone pain in her shoulders and in her elbows and her legs that as of yet has no definitive cause. The pain in her shoulder did not improve after the cortisone injection. It does seem to be responding to Dilaudid. She also has a superficial thrombophlebitis in the vein on her left forehead. This seems to be improving with time and with warm compresses.  At that time, my plan was: Patient now seems stable. She has severe chronic underlying medical conditions but at the present time they're stable. Her blood pressures adequate. Her edema seems to be slowly improving. Hopefully with the addition of Zaroxolyn the  edema will improve further. I asked the patient to increase her Lantus to 18 units twice daily and call me with the sugar readings next week. I will like her blood sugars to be between 100-200 given her frailty. Her urinary tract infection was confirmed to be completely treated on her last urine sample. The patient seems slowly to be improving. Therefore we discussed the elevated alkaline phosphatase level. Given her migratory bone pain, I am concerned about possible skeletal- based malignancy such as multiple myeloma or other condition such as Paget's disease. I recommended a bone scan. The patient would like to consider this with her son and notify me next week if she wants me to schedule cause due to her age and her frailty and how poorly she feels, she is not sure that she would want to pursue any further workup.  07/14/14 Patient is here with her son.   He states that she has been more confused Sat.  She is disoriented, lethargic, not eating well.  On Mini-Mental status exam today, the patient does not know the date or the year. She does know my name and the location. She is unable to spell world backward. She is unable to remember 3 objects on recall, she can only remember one. She is unable to perform serial sevens. There is no evidence of neurologic deficit. There is no  evidence of a recent stroke. She denies any word finding aphasia. There is been no facial droop or slurred speech.  At that time, my plan was: I suspect that the patient has a urinary tract infection. Recently she has had a very resistant urinary tract infection. I will obtain a urinalysis, CBC, and a CMP. I'll start the patient on fosfomycin 3 g every 3 days  For 3 doses. If no better or no evidence of urinary tract infection, consider a TIA versus stroke. If no evidence on a head CT, consider underlying dementia. I will also schedule the patient for a bone scan given her persistently elevated alkaline phosphatase   07/18/14 Patient is here today for follow-up. Urinalysis and urine culture confirmed a urinary tract infection with Klebsiella pneumonia. It was sensitive to Keflex. I'll switch the patient to Keflex. She is doing much better. Her mentation is improving. Her energy level is improving. This is the third or fourth serious urinary tract infection the patient has had an less than 6 weeks. As soon as she discontinues antibiotics the symptoms seem to return. The family is interested in seeing a urologist for prevention. She is scheduled to have a bone scan next week to evaluate for causes for her elevated alkaline phosphatase. Past Medical History  Diagnosis Date  . GERD (gastroesophageal reflux disease)   . PUD (peptic ulcer disease)   . Anemia   . Hypertension   . S/P endoscopy Aug 2011    3 superficial gastric ulcers, NSAID-induced  . S/P colonoscopy Sept 2011    left-sided diverticula, tubular adenoma  . Coronary artery disease   . Shortness of breath   . Diabetes mellitus     insulin dependent  . Headache(784.0)     rare migraines  . Cancer     hx of skin cancer  . Arthritis   . Osteopenia   . Hypercholesterolemia   . SIADH (syndrome of inappropriate ADH production)   . CHF (congestive heart failure)   . Myocardial infarction 2013  . Chronic renal failure, stage 3 (moderate)     Past Surgical History  Procedure Laterality  Date  . Tonsillectomy    . Appendectomy    . Eye surgery      cataracts  . Cardiac stents  07/19/2011  . Esophagogastroduodenoscopy  10/16/09    normal without barrett's/three superficial gastric ulcers  . Colonoscopy  12/04/09    normal rectum/left-sided diverticula  . Joint replacement  arthroscopy to knee  . Toe amputation Left 2013    left second toe amputation  . Cardiac catheterization  01/22/2013  . Coronary angioplasty  07/2011  . Coronary artery bypass graft N/A 11/08/2013    Procedure: CORONARY ARTERY BYPASS GRAFTING (CABG), on pump, times two, using left internal mammary artery, cryo saphenous vein.;  Surgeon: Ivin Poot, MD;  Location: [place];  Service: Open Heart Surgery;  Laterality: N/A;  LIMA-LAD CRYOVEIN -OM  . Intraoperative transesophageal echocardiogram N/A 11/08/2013    Procedure: INTRAOPERATIVE TRANSESOPHAGEAL ECHOCARDIOGRAM;  Surgeon: Ivin Poot, MD;  Location: [place];  Service: Open Heart Surgery;  Laterality: N/A;  . Mitral valve replacement N/A 11/08/2013    Procedure: MITRAL VALVE (MV) REPLACEMENT;  Surgeon: Ivin Poot, MD;  Location: [place];  Service: Open Heart Surgery;  Laterality: N/A;  #25 MAGNA MITRAL EASE  . Left heart catheterization with coronary angiogram N/A 07/19/2011    Procedure: LEFT HEART CATHETERIZATION WITH CORONARY ANGIOGRAM;  Surgeon: Laverda Page, MD;  Location: [facility] CATH LAB;  Service: Cardiovascular;  Laterality: N/A;  . Left heart catheterization with coronary angiogram N/A 12/21/2011    Procedure: LEFT HEART CATHETERIZATION WITH CORONARY ANGIOGRAM;  Surgeon: Laverda Page, MD;  Location: [facility] CATH LAB;  Service: Cardiovascular;  Laterality: N/A;  . Left heart catheterization with coronary angiogram N/A 02/20/2012    Procedure: LEFT HEART CATHETERIZATION WITH CORONARY ANGIOGRAM;  Surgeon: Laverda Page, MD;  Location: [facility] CATH LAB;  Service: Cardiovascular;  Laterality: N/A;  . Left  heart catheterization with coronary angiogram N/A 09/03/2012    Procedure: LEFT HEART CATHETERIZATION WITH CORONARY ANGIOGRAM;  Surgeon: Laverda Page, MD;  Location: [facility] CATH LAB;  Service: Cardiovascular;  Laterality: N/A;  . Percutaneous coronary stent intervention (pci-s)  09/03/2012    Procedure: PERCUTANEOUS CORONARY STENT INTERVENTION (PCI-S);  Surgeon: Laverda Page, MD;  Location: [facility] CATH LAB;  Service: Cardiovascular;;  . Left heart catheterization with coronary angiogram N/A 12/04/2012    Procedure: LEFT HEART CATHETERIZATION WITH CORONARY ANGIOGRAM;  Surgeon: Laverda Page, MD;  Location: [facility] CATH LAB;  Service: Cardiovascular;  Laterality: N/A;  . Left heart catheterization with coronary angiogram N/A 01/22/2013    Procedure: LEFT HEART CATHETERIZATION WITH CORONARY ANGIOGRAM;  Surgeon: Laverda Page, MD;  Location: [facility] CATH LAB;  Service: Cardiovascular;  Laterality: N/A;  . Left heart catheterization with coronary angiogram N/A 06/26/2013    Procedure: LEFT HEART CATHETERIZATION WITH CORONARY ANGIOGRAM;  Surgeon: Laverda Page, MD;  Location: [facility] CATH LAB;  Service: Cardiovascular;  Laterality: N/A;  . Left heart catheterization with coronary angiogram N/A 11/05/2013    Procedure: LEFT HEART CATHETERIZATION WITH CORONARY ANGIOGRAM;  Surgeon: Laverda Page, MD;  Location: [facility] CATH LAB;  Service: Cardiovascular;  Laterality: N/A;   Current Outpatient Prescriptions on File Prior to Visit  Medication Sig Dispense Refill  . ACCU-CHEK FASTCLIX LANCETS MISC Check BS QID - Fasting, before meals and QHS 100 each 5  . acetaminophen (TYLENOL) 500 MG tablet Take 2 tablets (1,000 mg total) by mouth every 6 (six) hours as needed. 60 tablet 11  . ascorbic acid (VITAMIN C) 500 MG  tablet Take 1 tablet (500 mg total) by mouth 2 (two) times daily. 60 tablet 11  . aspirin 81 MG chewable tablet Chew 2 tablets (162 mg total) by mouth daily. 60 tablet 11  . B-D ULTRAFINE III SHORT PEN 31G X 8  MM MISC 5 each by Does not apply route 5 (five) times daily. 150 each 11  . Blood Glucose Monitoring Suppl (ACCU-CHEK AVIVA) device Use as instructed 1 each 0  . cetirizine (ZYRTEC) 10 MG tablet Take 1 tablet (10 mg total) by mouth at bedtime. 30 tablet 11  . feeding supplement, GLUCERNA SHAKE, (GLUCERNA SHAKE) LIQD Take 237 mLs by mouth 3 (three) times daily with meals.  0  . ferrous sulfate 325 (65 FE) MG tablet Take 1 tablet (325 mg total) by mouth 2 (two) times daily with a meal. 60 tablet 11  . fluticasone (FLONASE) 50 MCG/ACT nasal spray Place 1 spray into both nostrils daily. 16 g 5  . fosfomycin (MONUROL) 3 G PACK Take 3 g by mouth every 3 (three) days. 3 g 0  . fosfomycin (MONUROL) 3 G PACK 3 g every 3 days for 3 doses 9 g 0  . glucose blood (ACCU-CHEK SMARTVIEW) test strip Check BS QID - Dx: E11.9 100 each 12  . HYDROmorphone (DILAUDID) 2 MG tablet Take 1 tablet (2 mg total) by mouth every 4 (four) hours as needed for severe pain. 30 tablet 0  . insulin aspart (NOVOLOG FLEXPEN) 100 UNIT/ML FlexPen 70-120=0    251-300=5 121-150=1  301-350=7 151-200=2  351-400=9 201-250=3  400 or >, call MD  units of insulin (Patient taking differently: Inject 0-10 Units into the skin 3 (three) times daily with meals. 70-120=0    251-300=5 121-150=1  301-350=7 151-200=2  351-400=9 201-250=3  400 or >, call MD  units of insulin) 15 mL 11  . Insulin Glargine (LANTUS) 100 UNIT/ML Solostar Pen Inject 12 Units into the skin 2 (two) times daily. 15 mL 5  . ipratropium-albuterol (DUONEB) 0.5-2.5 (3) MG/3ML SOLN Take 3 mLs by nebulization every 6 (six) hours as needed. 360 mL 1  . Melatonin 3 MG TABS Take 1.5 mg by mouth at bedtime as needed (insomnia).     . metolazone (ZAROXOLYN) 5 MG tablet     . metoprolol tartrate (LOPRESSOR) 25 MG tablet Take 1 tablet (25 mg total) by mouth 2 (two) times daily. 60 tablet 3  . nitroGLYCERIN (NITROSTAT) 0.4 MG SL tablet Place 1 tablet (0.4 mg total) under the tongue every  5 (five) minutes as needed for chest pain. 25 tablet 2  . ondansetron (ZOFRAN) 4 MG tablet Take 1 tablet (4 mg total) by mouth every 6 (six) hours as needed for nausea or vomiting. 30 tablet 3  . oxyCODONE (ROXICODONE) 5 MG immediate release tablet Take 0.5 tablets (2.5 mg total) by mouth every 4 (four) hours as needed for severe pain. 30 tablet 0  . pantoprazole (PROTONIX) 40 MG tablet Take 1 tablet (40 mg total) by mouth daily. 30 tablet 3  . polyethylene glycol (MIRALAX / GLYCOLAX) packet Take 17 g by mouth every other day. 24 each 11  . sennosides-docusate sodium (SENOKOT-S) 8.6-50 MG tablet Take 1 tablet by mouth 2 (two) times daily. 60 tablet 11  . simvastatin (ZOCOR) 20 MG tablet Take 1 tablet (20 mg total) by mouth daily. 30 tablet 5  . sodium chloride 1 G tablet Take 1 g by mouth 2 (two) times daily.    Marland Kitchen torsemide (DEMADEX) 20 MG  tablet Take 1 tablet (20 mg total) by mouth 2 (two) times daily. 60 tablet 11  . traZODone (DESYREL) 50 MG tablet Take 1 tablet (50 mg total) by mouth at bedtime. 30 tablet 2  . cephALEXin (KEFLEX) 500 MG capsule Take 1 capsule (500 mg total) by mouth 3 (three) times daily. X 10 days (Patient not taking: Reported on 07/18/2014) 30 capsule 0   No current facility-administered medications on file prior to visit.   Allergies  Allergen Reactions  . Aspirin Other (See Comments)    High doses caused stomach bleeds   History   Social History  . Marital Status: Widowed    Spouse Name: N/A  . Number of Children: N/A  . Years of Education: N/A   Occupational History  . Not on file.   Social History Main Topics  . Smoking status: Never Smoker   . Smokeless tobacco: Never Used  . Alcohol Use: No  . Drug Use: No  . Sexual Activity: No   Other Topics Concern  . Not on file   Social History Narrative      Review of Systems  All other systems reviewed and are negative.      Objective:   Physical Exam  Constitutional: She appears well-developed.  No distress.  Neck: Neck supple. No JVD present.  Cardiovascular: Normal rate, regular rhythm and normal heart sounds.   No murmur heard. Pulmonary/Chest: Effort normal. She has no rales.  Abdominal: Soft. Bowel sounds are normal. She exhibits no distension and no mass. There is no tenderness. There is no rebound and no guarding.  Musculoskeletal: She exhibits edema.  Vitals reviewed.         Assessment & Plan:  Systolic congestive heart failure, unspecified congestive heart failure chronicity - Plan: BASIC METABOLIC PANEL WITH GFR  Elevated alkaline phosphatase level  Acute delirium  Urinary tract infection without hematuria, site unspecified  Urinary tract infection seems to be improving. Complete the ten-day course of Keflex. I will also schedule the patient to see a urologist to discuss possible strategies to help prevent the recurrence of these urinary tract infections since they're happening so frequently. Patient may benefit from a daily antibiotic dose. I will await the results of bone scan to determine the cause of her elevated alkaline phosphatase. Patient has lost almost 20 pounds over the last few weeks. I believe the majority of this is fluid. She may be getting back to her euvolemic state. I will check her renal function and if increasing will decrease her dose of diuretics.

## 2014-07-19 LAB — BASIC METABOLIC PANEL WITH GFR
BUN: 65 mg/dL — AB (ref 6–23)
CO2: 26 mEq/L (ref 19–32)
Calcium: 8.9 mg/dL (ref 8.4–10.5)
Chloride: 94 mEq/L — ABNORMAL LOW (ref 96–112)
Creat: 2.08 mg/dL — ABNORMAL HIGH (ref 0.50–1.10)
GFR, EST NON AFRICAN AMERICAN: 22 mL/min — AB
GFR, Est African American: 25 mL/min — ABNORMAL LOW
Glucose, Bld: 338 mg/dL — ABNORMAL HIGH (ref 70–99)
Potassium: 4.3 mEq/L (ref 3.5–5.3)
SODIUM: 131 meq/L — AB (ref 135–145)

## 2014-07-21 ENCOUNTER — Telehealth: Payer: Self-pay | Admitting: *Deleted

## 2014-07-21 NOTE — Telephone Encounter (Signed)
Submitted [employer] referral thru acuity connect for authorization on 07/16/14 to [facility] for Bone scan with authorization number [id number]  Requesting provider:Warren Pickard,MD  Treating provider: [facility]  Number of visits:1  Start Date:07/16/14  End Date:01/12/15  Dx: R74.8-abnormal levels of other serum enzymes  Type of service:NUCLR  Procedures:78315-Bone Imaging 3 phase

## 2014-07-22 ENCOUNTER — Ambulatory Visit (HOSPITAL_COMMUNITY)
Admission: RE | Admit: 2014-07-22 | Discharge: 2014-07-22 | Disposition: A | Discharge status: Home / Self Care | Payer: Commercial Managed Care - HMO | Source: Ambulatory Visit | Attending: Family Medicine | Admitting: Family Medicine

## 2014-07-22 ENCOUNTER — Encounter (HOSPITAL_COMMUNITY): Payer: Self-pay

## 2014-07-22 ENCOUNTER — Encounter (HOSPITAL_COMMUNITY)
Admission: RE | Admit: 2014-07-22 | Discharge: 2014-07-22 | Disposition: A | Discharge status: Home / Self Care | Payer: Commercial Managed Care - HMO | Source: Ambulatory Visit | Attending: Family Medicine | Admitting: Family Medicine

## 2014-07-22 DIAGNOSIS — R748 Abnormal levels of other serum enzymes: Secondary | ICD-10-CM

## 2014-07-22 DIAGNOSIS — M25551 Pain in right hip: Secondary | ICD-10-CM | POA: Diagnosis not present

## 2014-07-22 MED ORDER — TECHNETIUM TC 99M MEDRONATE IV KIT
25.0000 | PACK | Freq: Once | INTRAVENOUS | Status: AC | PRN
  Administered 2014-07-22: 25 via INTRAVENOUS

## 2014-07-25 ENCOUNTER — Other Ambulatory Visit: Payer: Self-pay | Admitting: Family Medicine

## 2014-07-25 DIAGNOSIS — M25551 Pain in right hip: Secondary | ICD-10-CM

## 2014-07-28 ENCOUNTER — Telehealth: Payer: Self-pay | Admitting: Family Medicine

## 2014-07-28 MED ORDER — HYDROMORPHONE HCL 2 MG PO TABS: 2.0000 mg | ORAL_TABLET | ORAL | Status: DC | PRN

## 2014-07-28 NOTE — Telephone Encounter (Signed)
ok 

## 2014-07-28 NOTE — Telephone Encounter (Signed)
RX printed, left up front and patient aware to pick up  

## 2014-07-28 NOTE — Telephone Encounter (Signed)
Patient calling to get refill on hydromorphone  [phone]

## 2014-07-28 NOTE — Telephone Encounter (Signed)
?   OK to Refill  

## 2014-07-29 ENCOUNTER — Other Ambulatory Visit: Payer: Self-pay | Admitting: Family Medicine

## 2014-07-29 ENCOUNTER — Ambulatory Visit
Admission: RE | Admit: 2014-07-29 | Discharge: 2014-07-29 | Disposition: A | Discharge status: Home / Self Care | Payer: Commercial Managed Care - HMO | Source: Ambulatory Visit | Attending: Family Medicine | Admitting: Family Medicine

## 2014-07-29 DIAGNOSIS — I10 Essential (primary) hypertension: Secondary | ICD-10-CM | POA: Diagnosis not present

## 2014-07-29 DIAGNOSIS — M25551 Pain in right hip: Secondary | ICD-10-CM

## 2014-07-29 DIAGNOSIS — R600 Localized edema: Secondary | ICD-10-CM | POA: Diagnosis not present

## 2014-07-29 DIAGNOSIS — I5033 Acute on chronic diastolic (congestive) heart failure: Secondary | ICD-10-CM | POA: Diagnosis not present

## 2014-07-29 DIAGNOSIS — S79911A Unspecified injury of right hip, initial encounter: Secondary | ICD-10-CM | POA: Diagnosis not present

## 2014-08-09 DIAGNOSIS — I890 Lymphedema, not elsewhere classified: Secondary | ICD-10-CM | POA: Diagnosis not present

## 2014-08-09 DIAGNOSIS — R269 Unspecified abnormalities of gait and mobility: Secondary | ICD-10-CM | POA: Diagnosis not present

## 2014-08-09 DIAGNOSIS — I509 Heart failure, unspecified: Secondary | ICD-10-CM | POA: Diagnosis not present

## 2014-08-09 DIAGNOSIS — I2510 Atherosclerotic heart disease of native coronary artery without angina pectoris: Secondary | ICD-10-CM | POA: Diagnosis not present

## 2014-08-16 DIAGNOSIS — G4734 Idiopathic sleep related nonobstructive alveolar hypoventilation: Secondary | ICD-10-CM | POA: Diagnosis not present

## 2014-08-22 ENCOUNTER — Encounter: Payer: Self-pay | Admitting: Family Medicine

## 2014-08-22 ENCOUNTER — Ambulatory Visit (INDEPENDENT_AMBULATORY_CARE_PROVIDER_SITE_OTHER): Payer: Commercial Managed Care - HMO | Admitting: Family Medicine

## 2014-08-22 VITALS — BP 98/54 | HR 58 | Temp 98.2°F | Resp 16 | Ht 67.0 in | Wt 172.0 lb

## 2014-08-22 DIAGNOSIS — E119 Type 2 diabetes mellitus without complications: Secondary | ICD-10-CM | POA: Diagnosis not present

## 2014-08-22 DIAGNOSIS — Z0000 Encounter for general adult medical examination without abnormal findings: Secondary | ICD-10-CM | POA: Diagnosis not present

## 2014-08-22 DIAGNOSIS — D485 Neoplasm of uncertain behavior of skin: Secondary | ICD-10-CM

## 2014-08-22 LAB — COMPLETE METABOLIC PANEL WITH GFR
ALK PHOS: 138 U/L — AB (ref 39–117)
ALT: 19 U/L (ref 0–35)
AST: 23 U/L (ref 0–37)
Albumin: 3.7 g/dL (ref 3.5–5.2)
BILIRUBIN TOTAL: 0.9 mg/dL (ref 0.2–1.2)
BUN: 58 mg/dL — ABNORMAL HIGH (ref 6–23)
CALCIUM: 9 mg/dL (ref 8.4–10.5)
CO2: 26 mEq/L (ref 19–32)
Chloride: 99 mEq/L (ref 96–112)
Creat: 1.69 mg/dL — ABNORMAL HIGH (ref 0.50–1.10)
GFR, Est African American: 33 mL/min — ABNORMAL LOW
GFR, Est Non African American: 28 mL/min — ABNORMAL LOW
Glucose, Bld: 96 mg/dL (ref 70–99)
POTASSIUM: 4.2 meq/L (ref 3.5–5.3)
SODIUM: 138 meq/L (ref 135–145)
Total Protein: 6.8 g/dL (ref 6.0–8.3)

## 2014-08-22 LAB — URINALYSIS, MICROSCOPIC ONLY
CRYSTALS: NONE SEEN
Casts: NONE SEEN
RBC / HPF: NONE SEEN RBC/hpf (ref <3)

## 2014-08-22 LAB — CBC WITH DIFFERENTIAL/PLATELET
BASOS PCT: 1 % (ref 0–1)
Basophils Absolute: 0 10*3/uL (ref 0.0–0.1)
EOS ABS: 0.1 10*3/uL (ref 0.0–0.7)
EOS PCT: 2 % (ref 0–5)
HCT: 37.6 % (ref 36.0–46.0)
HEMOGLOBIN: 12.1 g/dL (ref 12.0–15.0)
LYMPHS ABS: 0.9 10*3/uL (ref 0.7–4.0)
Lymphocytes Relative: 21 % (ref 12–46)
MCH: 28.9 pg (ref 26.0–34.0)
MCHC: 32.2 g/dL (ref 30.0–36.0)
MCV: 89.7 fL (ref 78.0–100.0)
MPV: 10 fL (ref 8.6–12.4)
Monocytes Absolute: 0.4 10*3/uL (ref 0.1–1.0)
Monocytes Relative: 10 % (ref 3–12)
NEUTROS PCT: 66 % (ref 43–77)
Neutro Abs: 2.9 10*3/uL (ref 1.7–7.7)
Platelets: 172 10*3/uL (ref 150–400)
RBC: 4.19 MIL/uL (ref 3.87–5.11)
RDW: 16.2 % — AB (ref 11.5–15.5)
WBC: 4.4 10*3/uL (ref 4.0–10.5)

## 2014-08-22 LAB — URINALYSIS, ROUTINE W REFLEX MICROSCOPIC
Bilirubin Urine: NEGATIVE
GLUCOSE, UA: NEGATIVE mg/dL
HGB URINE DIPSTICK: NEGATIVE
Ketones, ur: NEGATIVE mg/dL
NITRITE: NEGATIVE
PROTEIN: NEGATIVE mg/dL
Specific Gravity, Urine: 1.01 (ref 1.005–1.030)
UROBILINOGEN UA: 0.2 mg/dL (ref 0.0–1.0)
pH: 5.5 (ref 5.0–8.0)

## 2014-08-22 LAB — HEMOGLOBIN A1C
Hgb A1c MFr Bld: 7.1 % — ABNORMAL HIGH (ref <5.7)
Mean Plasma Glucose: 157 mg/dL — ABNORMAL HIGH (ref <117)

## 2014-08-22 NOTE — Progress Notes (Signed)
Subjective:    Patient ID: [name], female    DOB: 04/03/1931, 81 y.o.   MRN: [id number]  HPI  Patient is here today for complete physical exam. She looks much better from the last time I saw her. She is now able to stand up from a seated position just holding onto the counter. This is a marked improvement from when she was wheelchair-bound just a month ago. She is more alert. Her memory has improved. She is exercising around the house by walking with a walker. She has not had a urinary tract infection and almost a month. Overall she seems to be improving dramatically. She is still very weak and she still requires a walker to ambulate but again I cannot emphasize how much improvement she is demonstrating from her last visit. We had a long discussion today regarding cancer screening. Just earlier this year I was discussing hospice placement with this patient. Therefore I do not believe it makes medical sense to pursue cancer screening such as a mammogram, Pap smear, colonoscopy. Both the patient and her son are in agreement. She is due for Pneumovax 23 today. She will receive that. She has had Prevnar 13. Otherwise her preventative care is up-to-date Past Medical History  Diagnosis Date  . GERD (gastroesophageal reflux disease)   . PUD (peptic ulcer disease)   . Anemia   . Hypertension   . S/P endoscopy Aug 2011    3 superficial gastric ulcers, NSAID-induced  . S/P colonoscopy Sept 2011    left-sided diverticula, tubular adenoma  . Coronary artery disease   . Shortness of breath   . Diabetes mellitus     insulin dependent  . Headache(784.0)     rare migraines  . Cancer     hx of skin cancer  . Arthritis   . Osteopenia   . Hypercholesterolemia   . SIADH (syndrome of inappropriate ADH production)   . CHF (congestive heart failure)   . Myocardial infarction 2013  . Chronic renal failure, stage 3 (moderate)    Past Surgical History  Procedure Laterality Date  . Tonsillectomy     . Appendectomy    . Eye surgery      cataracts  . Cardiac stents  07/19/2011  . Esophagogastroduodenoscopy  10/16/09    normal without barrett's/three superficial gastric ulcers  . Colonoscopy  12/04/09    normal rectum/left-sided diverticula  . Joint replacement  arthroscopy to knee  . Toe amputation Left 2013    left second toe amputation  . Cardiac catheterization  01/22/2013  . Coronary angioplasty  07/2011  . Coronary artery bypass graft N/A 11/08/2013    Procedure: CORONARY ARTERY BYPASS GRAFTING (CABG), on pump, times two, using left internal mammary artery, cryo saphenous vein.;  Surgeon: Ivin Poot, MD;  Location: [place];  Service: Open Heart Surgery;  Laterality: N/A;  LIMA-LAD CRYOVEIN -OM  . Intraoperative transesophageal echocardiogram N/A 11/08/2013    Procedure: INTRAOPERATIVE TRANSESOPHAGEAL ECHOCARDIOGRAM;  Surgeon: Ivin Poot, MD;  Location: [place];  Service: Open Heart Surgery;  Laterality: N/A;  . Mitral valve replacement N/A 11/08/2013    Procedure: MITRAL VALVE (MV) REPLACEMENT;  Surgeon: Ivin Poot, MD;  Location: [place];  Service: Open Heart Surgery;  Laterality: N/A;  #25 MAGNA MITRAL EASE  . Left heart catheterization with coronary angiogram N/A 07/19/2011    Procedure: LEFT HEART CATHETERIZATION WITH CORONARY ANGIOGRAM;  Surgeon: Laverda Page, MD;  Location: [facility] CATH LAB;  Service: Cardiovascular;  Laterality: N/A;  . Left heart catheterization with coronary angiogram N/A 12/21/2011    Procedure: LEFT HEART CATHETERIZATION WITH CORONARY ANGIOGRAM;  Surgeon: Laverda Page, MD;  Location: [facility] CATH LAB;  Service: Cardiovascular;  Laterality: N/A;  . Left heart catheterization with coronary angiogram N/A 02/20/2012    Procedure: LEFT HEART CATHETERIZATION WITH CORONARY ANGIOGRAM;  Surgeon: Laverda Page, MD;  Location: [facility] CATH LAB;  Service: Cardiovascular;  Laterality: N/A;  . Left heart catheterization with coronary angiogram N/A 09/03/2012     Procedure: LEFT HEART CATHETERIZATION WITH CORONARY ANGIOGRAM;  Surgeon: Laverda Page, MD;  Location: [facility] CATH LAB;  Service: Cardiovascular;  Laterality: N/A;  . Percutaneous coronary stent intervention (pci-s)  09/03/2012    Procedure: PERCUTANEOUS CORONARY STENT INTERVENTION (PCI-S);  Surgeon: Laverda Page, MD;  Location: [facility] CATH LAB;  Service: Cardiovascular;;  . Left heart catheterization with coronary angiogram N/A 12/04/2012    Procedure: LEFT HEART CATHETERIZATION WITH CORONARY ANGIOGRAM;  Surgeon: Laverda Page, MD;  Location: [facility] CATH LAB;  Service: Cardiovascular;  Laterality: N/A;  . Left heart catheterization with coronary angiogram N/A 01/22/2013    Procedure: LEFT HEART CATHETERIZATION WITH CORONARY ANGIOGRAM;  Surgeon: Laverda Page, MD;  Location: [facility] CATH LAB;  Service: Cardiovascular;  Laterality: N/A;  . Left heart catheterization with coronary angiogram N/A 06/26/2013    Procedure: LEFT HEART CATHETERIZATION WITH CORONARY ANGIOGRAM;  Surgeon: Laverda Page, MD;  Location: [facility] CATH LAB;  Service: Cardiovascular;  Laterality: N/A;  . Left heart catheterization with coronary angiogram N/A 11/05/2013    Procedure: LEFT HEART CATHETERIZATION WITH CORONARY ANGIOGRAM;  Surgeon: Laverda Page, MD;  Location: [facility] CATH LAB;  Service: Cardiovascular;  Laterality: N/A;   Current Outpatient Prescriptions on File Prior to Visit  Medication Sig Dispense Refill  . ACCU-CHEK FASTCLIX LANCETS MISC Check BS QID - Fasting, before meals and QHS 100 each 5  . acetaminophen (TYLENOL) 500 MG tablet Take 2 tablets (1,000 mg total) by mouth every 6 (six) hours as needed. 60 tablet 11  . ascorbic acid (VITAMIN C) 500 MG tablet Take 1 tablet (500 mg total) by mouth 2 (two) times daily. 60 tablet 11  . aspirin 81 MG chewable tablet Chew 2 tablets (162 mg total) by mouth daily. 60 tablet 11  . B-D ULTRAFINE III SHORT PEN 31G X 8 MM MISC 5 each by Does not apply route 5 (five) times daily.  150 each 11  . Blood Glucose Monitoring Suppl (ACCU-CHEK AVIVA) device Use as instructed 1 each 0  . cephALEXin (KEFLEX) 500 MG capsule Take 1 capsule (500 mg total) by mouth 3 (three) times daily. X 10 days (Patient not taking: Reported on 07/18/2014) 30 capsule 0  . cetirizine (ZYRTEC) 10 MG tablet Take 1 tablet (10 mg total) by mouth at bedtime. 30 tablet 11  . feeding supplement, GLUCERNA SHAKE, (GLUCERNA SHAKE) LIQD Take 237 mLs by mouth 3 (three) times daily with meals.  0  . ferrous sulfate 325 (65 FE) MG tablet Take 1 tablet (325 mg total) by mouth 2 (two) times daily with a meal. 60 tablet 11  . fluticasone (FLONASE) 50 MCG/ACT nasal spray Place 1 spray into both nostrils daily. 16 g 5  . fosfomycin (MONUROL) 3 G PACK Take 3 g by mouth every 3 (three) days. 3 g 0  . fosfomycin (MONUROL) 3 G PACK 3 g every 3 days for 3 doses 9 g 0  . glucose blood (ACCU-CHEK SMARTVIEW) test strip Check  BS QID - Dx: E11.9 100 each 12  . HYDROmorphone (DILAUDID) 2 MG tablet Take 1 tablet (2 mg total) by mouth every 4 (four) hours as needed for severe pain. 30 tablet 0  . insulin aspart (NOVOLOG FLEXPEN) 100 UNIT/ML FlexPen 70-120=0    251-300=5 121-150=1  301-350=7 151-200=2  351-400=9 201-250=3  400 or >, call MD  units of insulin (Patient taking differently: Inject 0-10 Units into the skin 3 (three) times daily with meals. 70-120=0    251-300=5 121-150=1  301-350=7 151-200=2  351-400=9 201-250=3  400 or >, call MD  units of insulin) 15 mL 11  . Insulin Glargine (LANTUS) 100 UNIT/ML Solostar Pen Inject 12 Units into the skin 2 (two) times daily. 15 mL 5  . ipratropium-albuterol (DUONEB) 0.5-2.5 (3) MG/3ML SOLN Take 3 mLs by nebulization every 6 (six) hours as needed. 360 mL 1  . Melatonin 3 MG TABS Take 1.5 mg by mouth at bedtime as needed (insomnia).     . metolazone (ZAROXOLYN) 5 MG tablet     . metoprolol tartrate (LOPRESSOR) 25 MG tablet Take 1 tablet (25 mg total) by mouth 2 (two) times daily. 60  tablet 3  . nitroGLYCERIN (NITROSTAT) 0.4 MG SL tablet Place 1 tablet (0.4 mg total) under the tongue every 5 (five) minutes as needed for chest pain. 25 tablet 2  . ondansetron (ZOFRAN) 4 MG tablet Take 1 tablet (4 mg total) by mouth every 6 (six) hours as needed for nausea or vomiting. 30 tablet 3  . oxyCODONE (ROXICODONE) 5 MG immediate release tablet Take 0.5 tablets (2.5 mg total) by mouth every 4 (four) hours as needed for severe pain. 30 tablet 0  . pantoprazole (PROTONIX) 40 MG tablet Take 1 tablet (40 mg total) by mouth daily. 30 tablet 3  . polyethylene glycol (MIRALAX / GLYCOLAX) packet Take 17 g by mouth every other day. 24 each 11  . sennosides-docusate sodium (SENOKOT-S) 8.6-50 MG tablet Take 1 tablet by mouth 2 (two) times daily. 60 tablet 11  . simvastatin (ZOCOR) 20 MG tablet Take 1 tablet (20 mg total) by mouth daily. 30 tablet 5  . sodium chloride 1 G tablet Take 1 g by mouth 2 (two) times daily.    Marland Kitchen torsemide (DEMADEX) 20 MG tablet Take 1 tablet (20 mg total) by mouth 2 (two) times daily. 60 tablet 11  . traZODone (DESYREL) 50 MG tablet Take 1 tablet (50 mg total) by mouth at bedtime. 30 tablet 2   No current facility-administered medications on file prior to visit.   Allergies  Allergen Reactions  . Aspirin Other (See Comments)    High doses caused stomach bleeds   History   Social History  . Marital Status: Widowed    Spouse Name: N/A  . Number of Children: N/A  . Years of Education: N/A   Occupational History  . Not on file.   Social History Main Topics  . Smoking status: Never Smoker   . Smokeless tobacco: Never Used  . Alcohol Use: No  . Drug Use: No  . Sexual Activity: No   Other Topics Concern  . Not on file   Social History Narrative   No family history on file.    Review of Systems  All other systems reviewed and are negative.      Objective:   Physical Exam  Constitutional: She is oriented to person, place, and time. She appears  well-developed and well-nourished. No distress.  HENT:  Head: Normocephalic and  atraumatic.  Right Ear: External ear normal.  Left Ear: External ear normal.  Nose: Nose normal.  Mouth/Throat: Oropharynx is clear and moist.  Eyes: Conjunctivae and EOM are normal. Pupils are equal, round, and reactive to light. Right eye exhibits no discharge. Left eye exhibits no discharge. No scleral icterus.  Neck: Normal range of motion. Neck supple. No JVD present. No tracheal deviation present. No thyromegaly present.  Cardiovascular: Normal rate, regular rhythm, normal heart sounds and intact distal pulses.  Exam reveals no gallop and no friction rub.   No murmur heard. Pulmonary/Chest: Effort normal and breath sounds normal. No stridor. No respiratory distress. She has no wheezes. She has no rales. She exhibits no tenderness.  Abdominal: Soft. Bowel sounds are normal. She exhibits no distension and no mass. There is no tenderness. There is no rebound and no guarding.  Musculoskeletal: She exhibits edema.  Lymphadenopathy:    She has no cervical adenopathy.  Neurological: She is alert and oriented to person, place, and time. She has normal reflexes. She displays normal reflexes. No cranial nerve deficit. She exhibits abnormal muscle tone. Coordination normal.  Skin: Skin is warm. No rash noted. She is not diaphoretic. No erythema. No pallor.  Psychiatric: She has a normal mood and affect. Her behavior is normal. Judgment and thought content normal.  Vitals reviewed.        Assessment & Plan:  Routine general medical examination at a health care facility - Plan: CBC with Differential/Platelet, COMPLETE METABOLIC PANEL WITH GFR, Hemoglobin A1c, Urinalysis, Routine w reflex microscopic (not at [facility])  Neoplasm of uncertain behavior of skin - Plan: Ambulatory referral to Dermatology  Physical exam is significant for several cancerous appearing lesions on her left cheek. One appears to be a basal cell  cancer. The other appears to be a squamous cell cancer. Given their size and location, I will refer the patient to a dermatologist. Patient received Pneumovax 23 today. Her weight has stabilized and has actually improved 3 pounds. She is no longer rapidly losing weight. Her pulmonary edema has resolved. She does have some trace peripheral edema but this is good for this patient and is at her baseline. Her muscle strength and deconditioning appears to be improving. I will obtain a CBC, CMP, fasting lipid panel, hemoglobin A1c, and a urinalysis. Goal hemoglobin A1c is less than 8 given the patient's frailty. At the present time on Lantus 18 units twice daily, her blood sugars are all less than 200 and I believe this is acceptable. I will check a urinalysis to rule out a urinary tract infection as this is seem to be the cause of the patient's decompensation recently and I want to be proactive about finding that prior to it causing her to decompensate.

## 2014-09-01 ENCOUNTER — Other Ambulatory Visit: Payer: Self-pay

## 2014-09-04 ENCOUNTER — Other Ambulatory Visit: Payer: Self-pay | Admitting: Family Medicine

## 2014-09-04 MED ORDER — GLUCOSE BLOOD VI STRP: 1.0000 | ORAL_STRIP | Freq: Three times a day (TID) | Status: DC

## 2014-09-04 MED ORDER — ACCU-CHEK FASTCLIX LANCETS MISC: 1.0000 | Freq: Three times a day (TID) | Status: DC

## 2014-09-04 NOTE — Telephone Encounter (Signed)
Test strips, lancets refilled

## 2014-09-05 ENCOUNTER — Telehealth: Payer: Self-pay | Admitting: *Deleted

## 2014-09-05 NOTE — Telephone Encounter (Signed)
Submitted humana referral thru acuity connect for authorization on 09/04/14 to Dr. Gertie Gowda with authorization number [id number]  Requesting provider:Warren T. Pickard,MD  Treating provider: Gertie Gowda  Number of visits:6  Start Date: 09/11/14  End Date: 03/10/2015  Dx:D48.5-Neoplasm of uncertain behavior of skin  Copy has been faxed to Dr. Rozann Lesches for records/Review

## 2014-09-08 DIAGNOSIS — I509 Heart failure, unspecified: Secondary | ICD-10-CM | POA: Diagnosis not present

## 2014-09-08 DIAGNOSIS — I890 Lymphedema, not elsewhere classified: Secondary | ICD-10-CM | POA: Diagnosis not present

## 2014-09-08 DIAGNOSIS — I2510 Atherosclerotic heart disease of native coronary artery without angina pectoris: Secondary | ICD-10-CM | POA: Diagnosis not present

## 2014-09-08 DIAGNOSIS — R269 Unspecified abnormalities of gait and mobility: Secondary | ICD-10-CM | POA: Diagnosis not present

## 2014-09-09 NOTE — Patient Outreach (Signed)
[place] [facility]) Care Management  09/09/2014  Claudia [name] 05-31-1931 [id number]   Referral per [place] Stratification [place], [name], RN assigned.  [name]. [place], [place] Management [place] Assistant Phone: [phone] Fax: [phone]

## 2014-09-15 DIAGNOSIS — G4734 Idiopathic sleep related nonobstructive alveolar hypoventilation: Secondary | ICD-10-CM | POA: Diagnosis not present

## 2014-09-19 ENCOUNTER — Other Ambulatory Visit: Payer: Self-pay | Admitting: *Deleted

## 2014-09-19 NOTE — Patient Outreach (Signed)
[place] [facility]) Care Management  09/19/2014  YICEL SHANNON 11/03/1931 [id number]   Referral per [employer] Risk Stratification [place]:  Telephone call to patient; busy x 2.  Plan: will follow up.  [name], RN BSN [place] Management Coordinator [facility] Care Management  [phone]

## 2014-09-22 ENCOUNTER — Other Ambulatory Visit: Payer: Self-pay | Admitting: *Deleted

## 2014-09-22 NOTE — Patient Outreach (Signed)
[place] [facility]) Care Management  09/22/2014  Claudia [name] June 11, 1931 [id number]   Telephone call x 2; spoke with patient's son who states she is resting currently and unable to come to phone. Voices better to call back between 3-5pm.  Contact information left requesting call back from patient.  Plan: will follow up.  [name], RN BSN [place] Management Coordinator [facility] Care Management  [phone]

## 2014-09-26 ENCOUNTER — Ambulatory Visit: Payer: Self-pay | Admitting: *Deleted

## 2014-09-29 IMAGING — CR DG CHEST 1V PORT
1 series · 1 of 1 positions shown · non-contrast
Comparison: 12/01/2013

CLINICAL DATA: Status post stroke and cardiac surgery

EXAM:
PORTABLE CHEST - 1 VIEW

[AP]
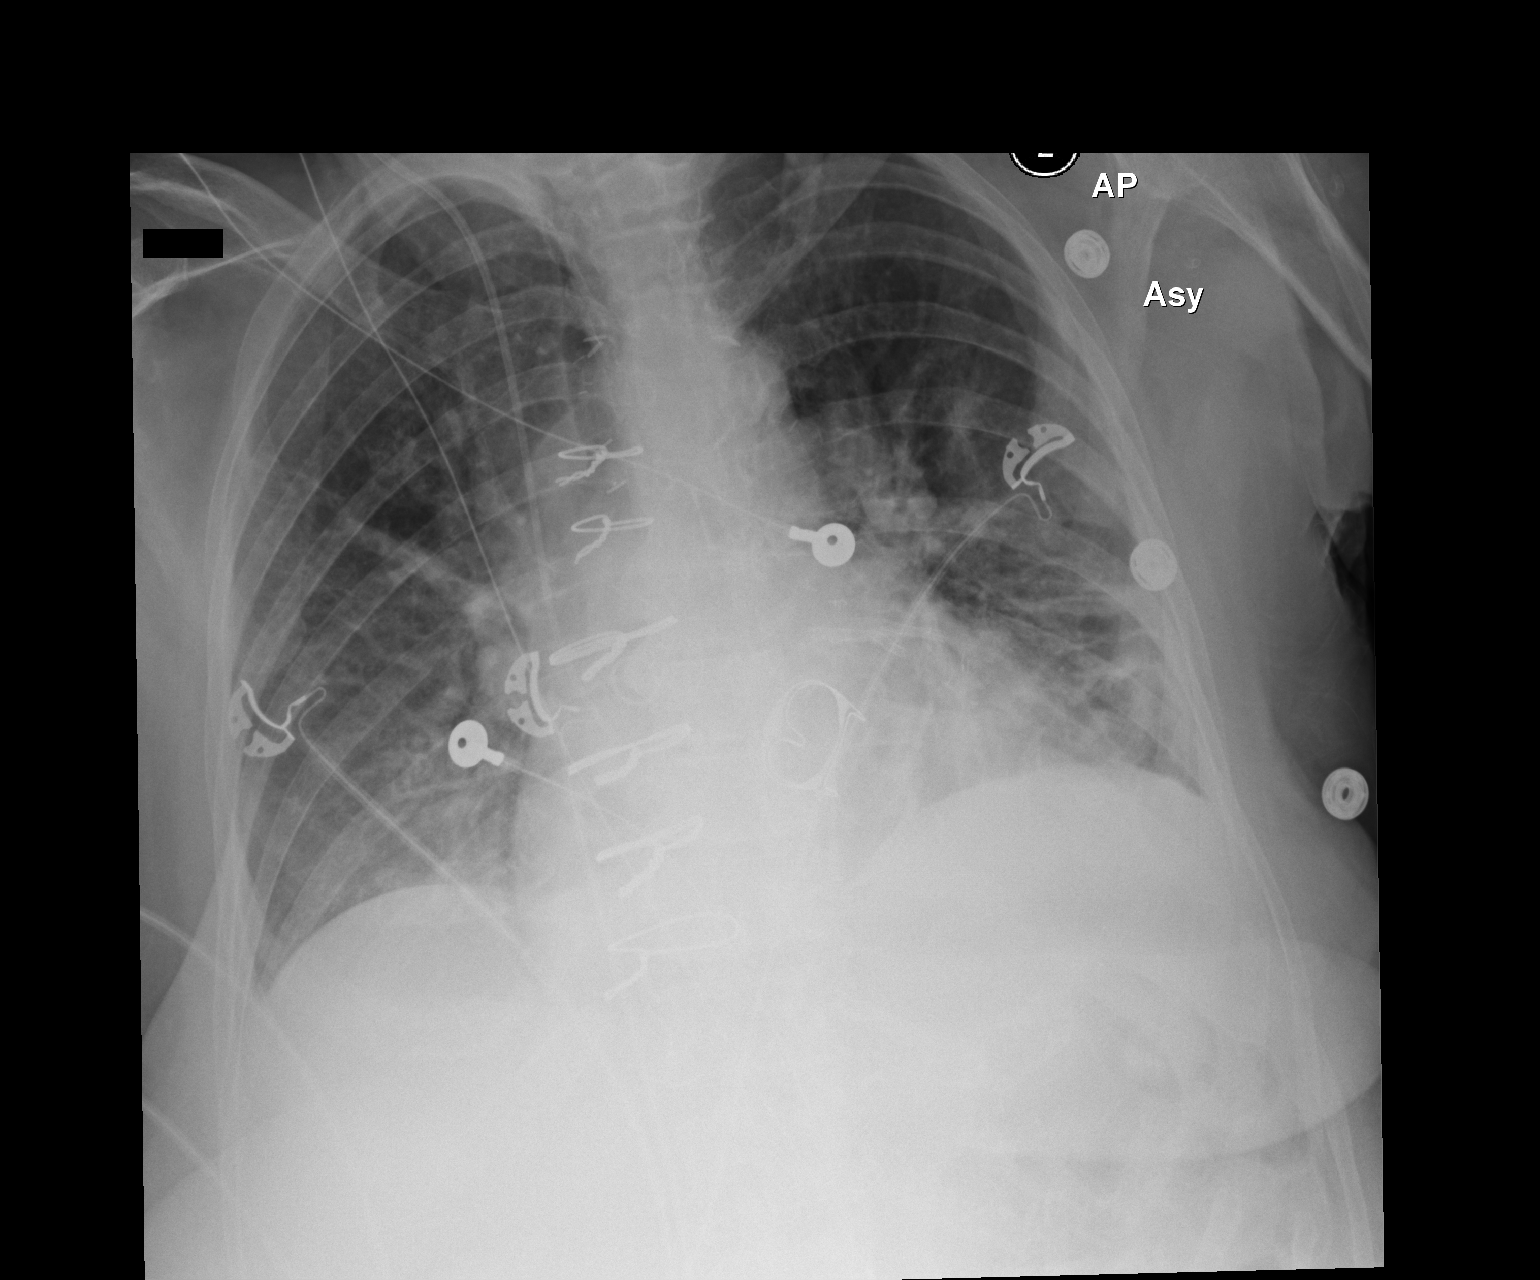

[1 of 1 positions shown; findings below may reference images not displayed]

FINDINGS: The right jugular central line is again noted at the cavoatrial
junction. Postsurgical changes are again seen and stable. The
cardiac shadow is stable. The overall inspiratory effort is stable.
Patchy atelectatic changes are again seen and stable. No new focal
confluent infiltrate is seen.
IMPRESSION: Stable appearance of the chest when compare with the prior exam.

## 2014-09-30 IMAGING — CR DG CHEST 1V PORT
1 series · 1 of 1 positions shown · non-contrast
Comparison: December 02, 2013

CLINICAL DATA: Mitral valve disease

EXAM:
PORTABLE CHEST - 1 VIEW

[portable]
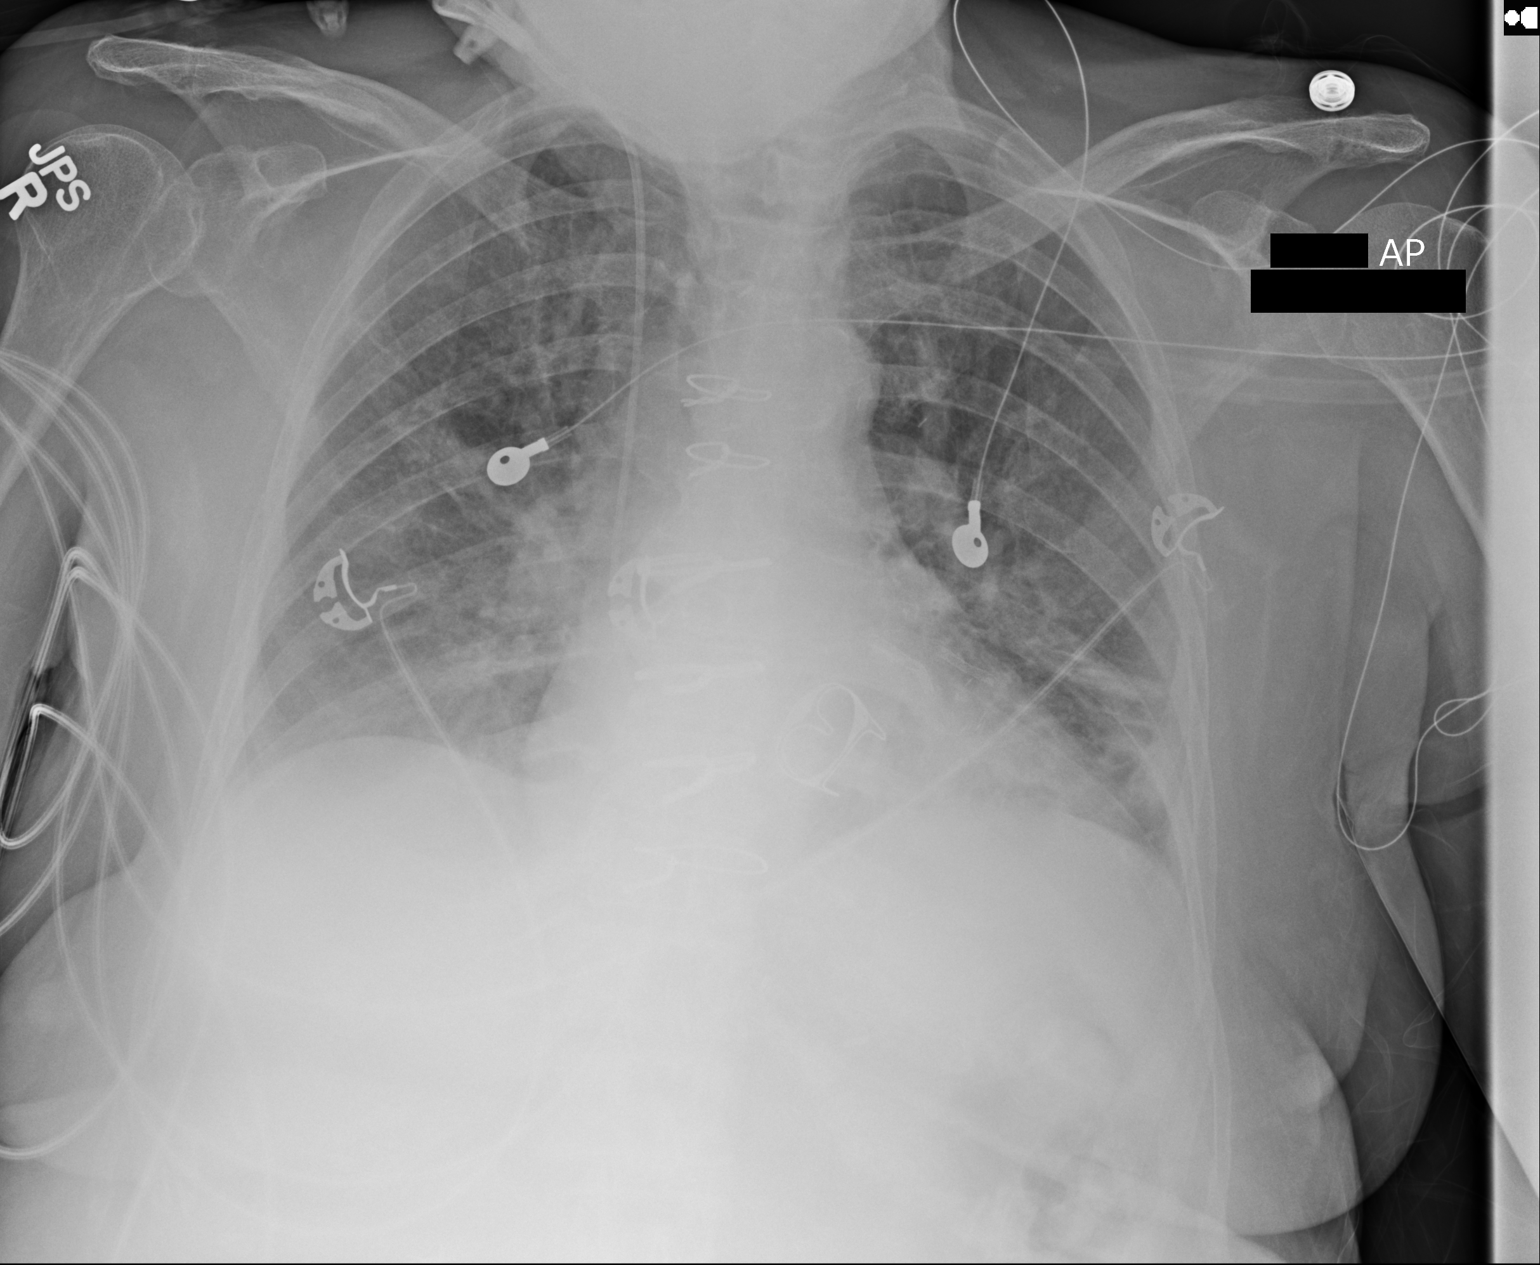

[1 of 1 positions shown; findings below may reference images not displayed]

FINDINGS: There is patchy atelectasis in the left lower lobe which appears
stable. There is atelectasis in the right mid lung is well, stable.
There is no frank edema or consolidation. Heart is borderline
enlarged with pulmonary vascularity within normal limits. Central
catheter tip is at the cavoatrial junction. No pneumothorax. Patient
is status post mitral valve replacement and coronary artery bypass
grafting.
IMPRESSION: Areas of patchy atelectatic change bilaterally are stable. No
effusions are appreciable. Heart is prominent but stable. No
pneumothorax.

## 2014-10-01 IMAGING — CR DG CHEST 1V PORT
1 series · 1 of 1 positions shown · non-contrast
Comparison: Portable exam 2280 hr compared to 12/03/2013

CLINICAL DATA: Pleural effusion

EXAM:
PORTABLE CHEST - 1 VIEW

[AP]
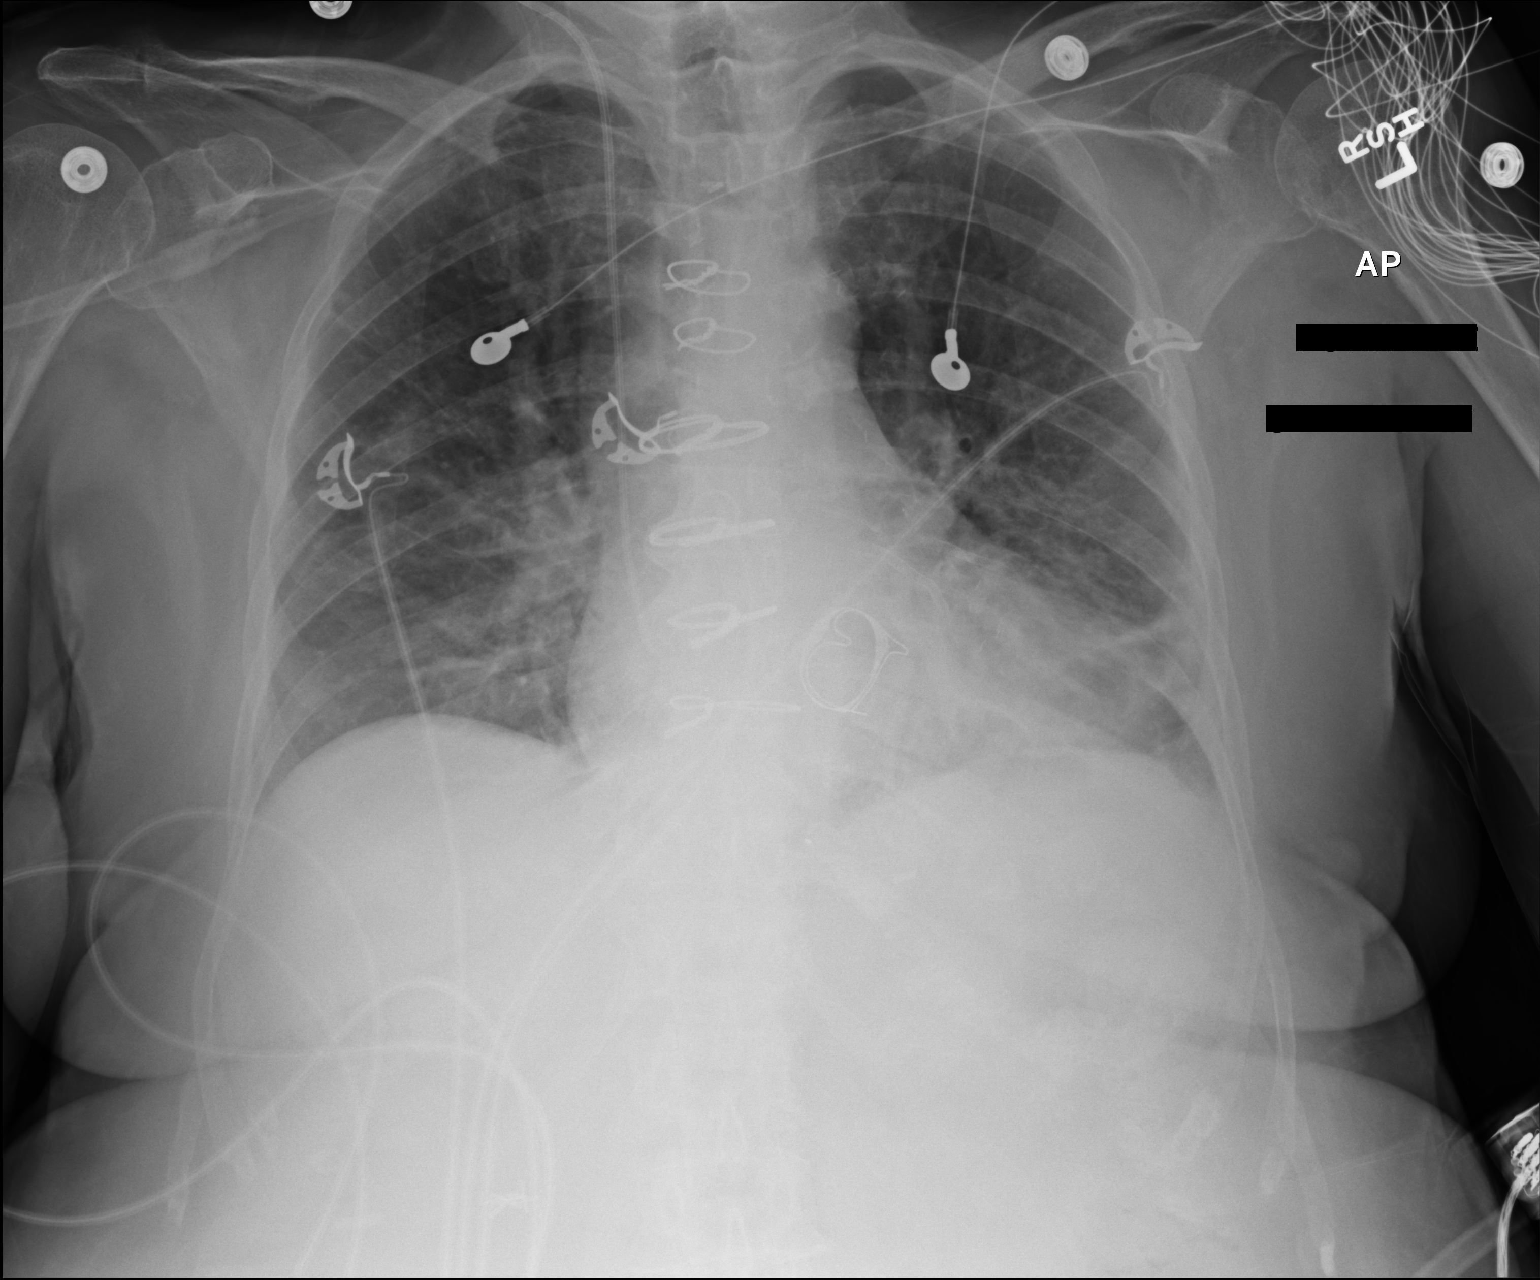

[1 of 1 positions shown; findings below may reference images not displayed]

FINDINGS: Tip of RIGHT jugular central venous catheter projects over high
RIGHT atrium, [DATE] cm for positioning at the cavoatrial
junction.

Enlargement of cardiac silhouette post coronary stenting, CABG, and
MVR.

Mediastinal contours and pulmonary vascularity normal.

Atherosclerotic calcification aortic arch.

Bibasilar atelectasis without pulmonary infiltrate or gross pleural
effusion.

No pneumothorax.

Bones unremarkable.
IMPRESSION: Enlargement of cardiac silhouette post CABG, coronary PTCA and MVR.

Bibasilar atelectasis.

Tip of RIGHT jugular central venous catheter projects over high
RIGHT atrium as above.

## 2014-10-02 DIAGNOSIS — I5033 Acute on chronic diastolic (congestive) heart failure: Secondary | ICD-10-CM | POA: Diagnosis not present

## 2014-10-02 DIAGNOSIS — R600 Localized edema: Secondary | ICD-10-CM | POA: Diagnosis not present

## 2014-10-02 DIAGNOSIS — E1122 Type 2 diabetes mellitus with diabetic chronic kidney disease: Secondary | ICD-10-CM | POA: Diagnosis not present

## 2014-10-02 DIAGNOSIS — I10 Essential (primary) hypertension: Secondary | ICD-10-CM | POA: Diagnosis not present

## 2014-10-09 DIAGNOSIS — R269 Unspecified abnormalities of gait and mobility: Secondary | ICD-10-CM | POA: Diagnosis not present

## 2014-10-09 DIAGNOSIS — I2510 Atherosclerotic heart disease of native coronary artery without angina pectoris: Secondary | ICD-10-CM | POA: Diagnosis not present

## 2014-10-09 DIAGNOSIS — I509 Heart failure, unspecified: Secondary | ICD-10-CM | POA: Diagnosis not present

## 2014-10-09 DIAGNOSIS — I890 Lymphedema, not elsewhere classified: Secondary | ICD-10-CM | POA: Diagnosis not present

## 2014-10-16 ENCOUNTER — Other Ambulatory Visit: Payer: Self-pay | Admitting: Family Medicine

## 2014-10-16 DIAGNOSIS — G4734 Idiopathic sleep related nonobstructive alveolar hypoventilation: Secondary | ICD-10-CM | POA: Diagnosis not present

## 2014-10-17 NOTE — Telephone Encounter (Signed)
Medication refilled per protocol. 

## 2014-10-25 ENCOUNTER — Other Ambulatory Visit: Payer: Self-pay | Admitting: Family Medicine

## 2014-10-31 ENCOUNTER — Other Ambulatory Visit: Payer: Self-pay | Admitting: *Deleted

## 2014-10-31 ENCOUNTER — Encounter: Payer: Self-pay | Admitting: *Deleted

## 2014-10-31 NOTE — Patient Outreach (Signed)
[place] [facility]) Care Management  10/31/2014  Claudia [name] [birth year] [id number]  Telephone call to patient; left message on voice mail requesting call bck.  Plan: will sent outreach letter and follow up in 10 business. [name], RN BSN [place] Management Coordinator [facility] Care Management  [phone]

## 2014-11-05 IMAGING — CR DG CHEST 2V
2 series · 2 of 2 positions shown · non-contrast
Comparison: 12/23/2013

CLINICAL DATA: Patient underwent cardiac surgery November 2013 and
now complains of cough, personal history of atrial fibrillation and
hypertension, no smoking history

EXAM:
CHEST  2 VIEW

[w chest lat]
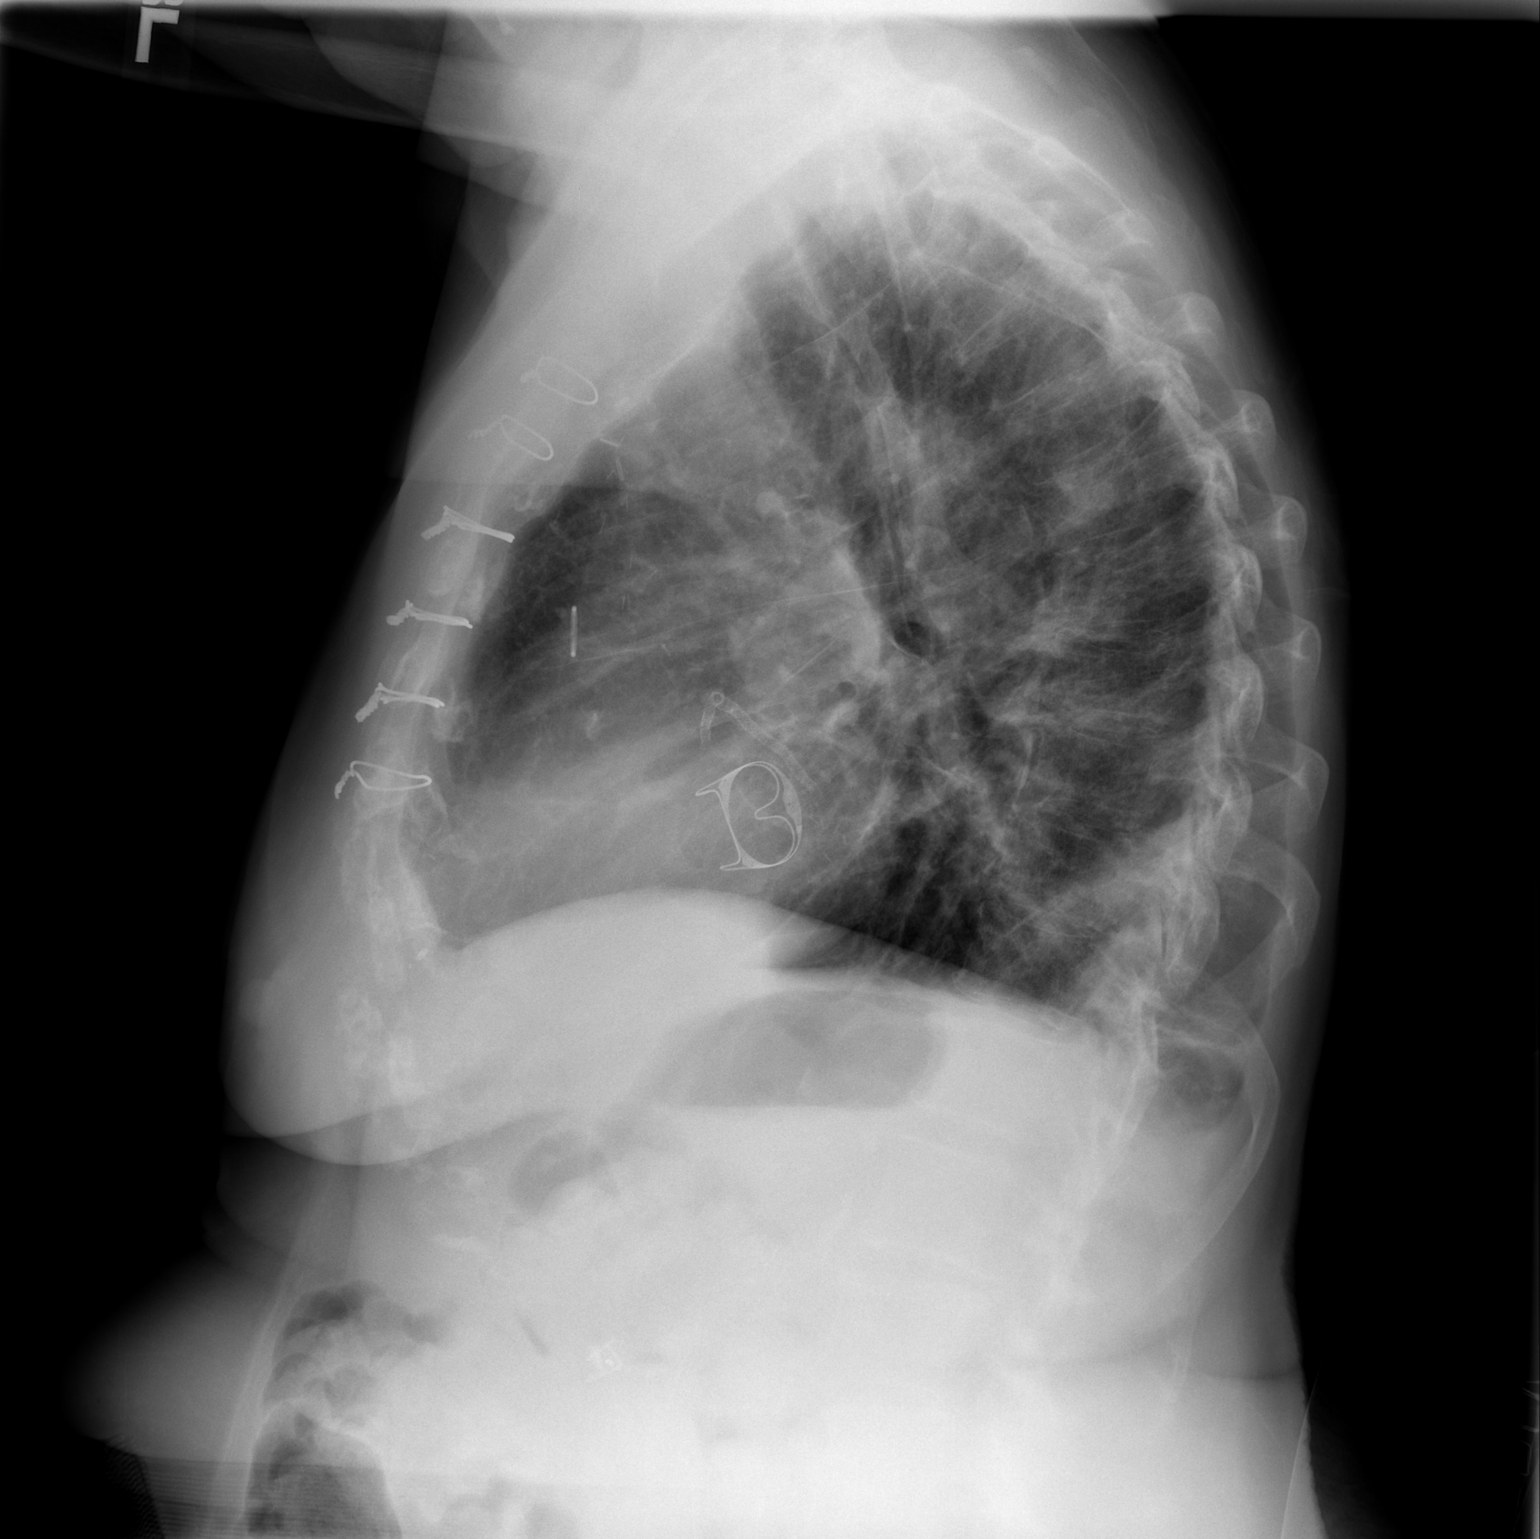

[w chest ap]
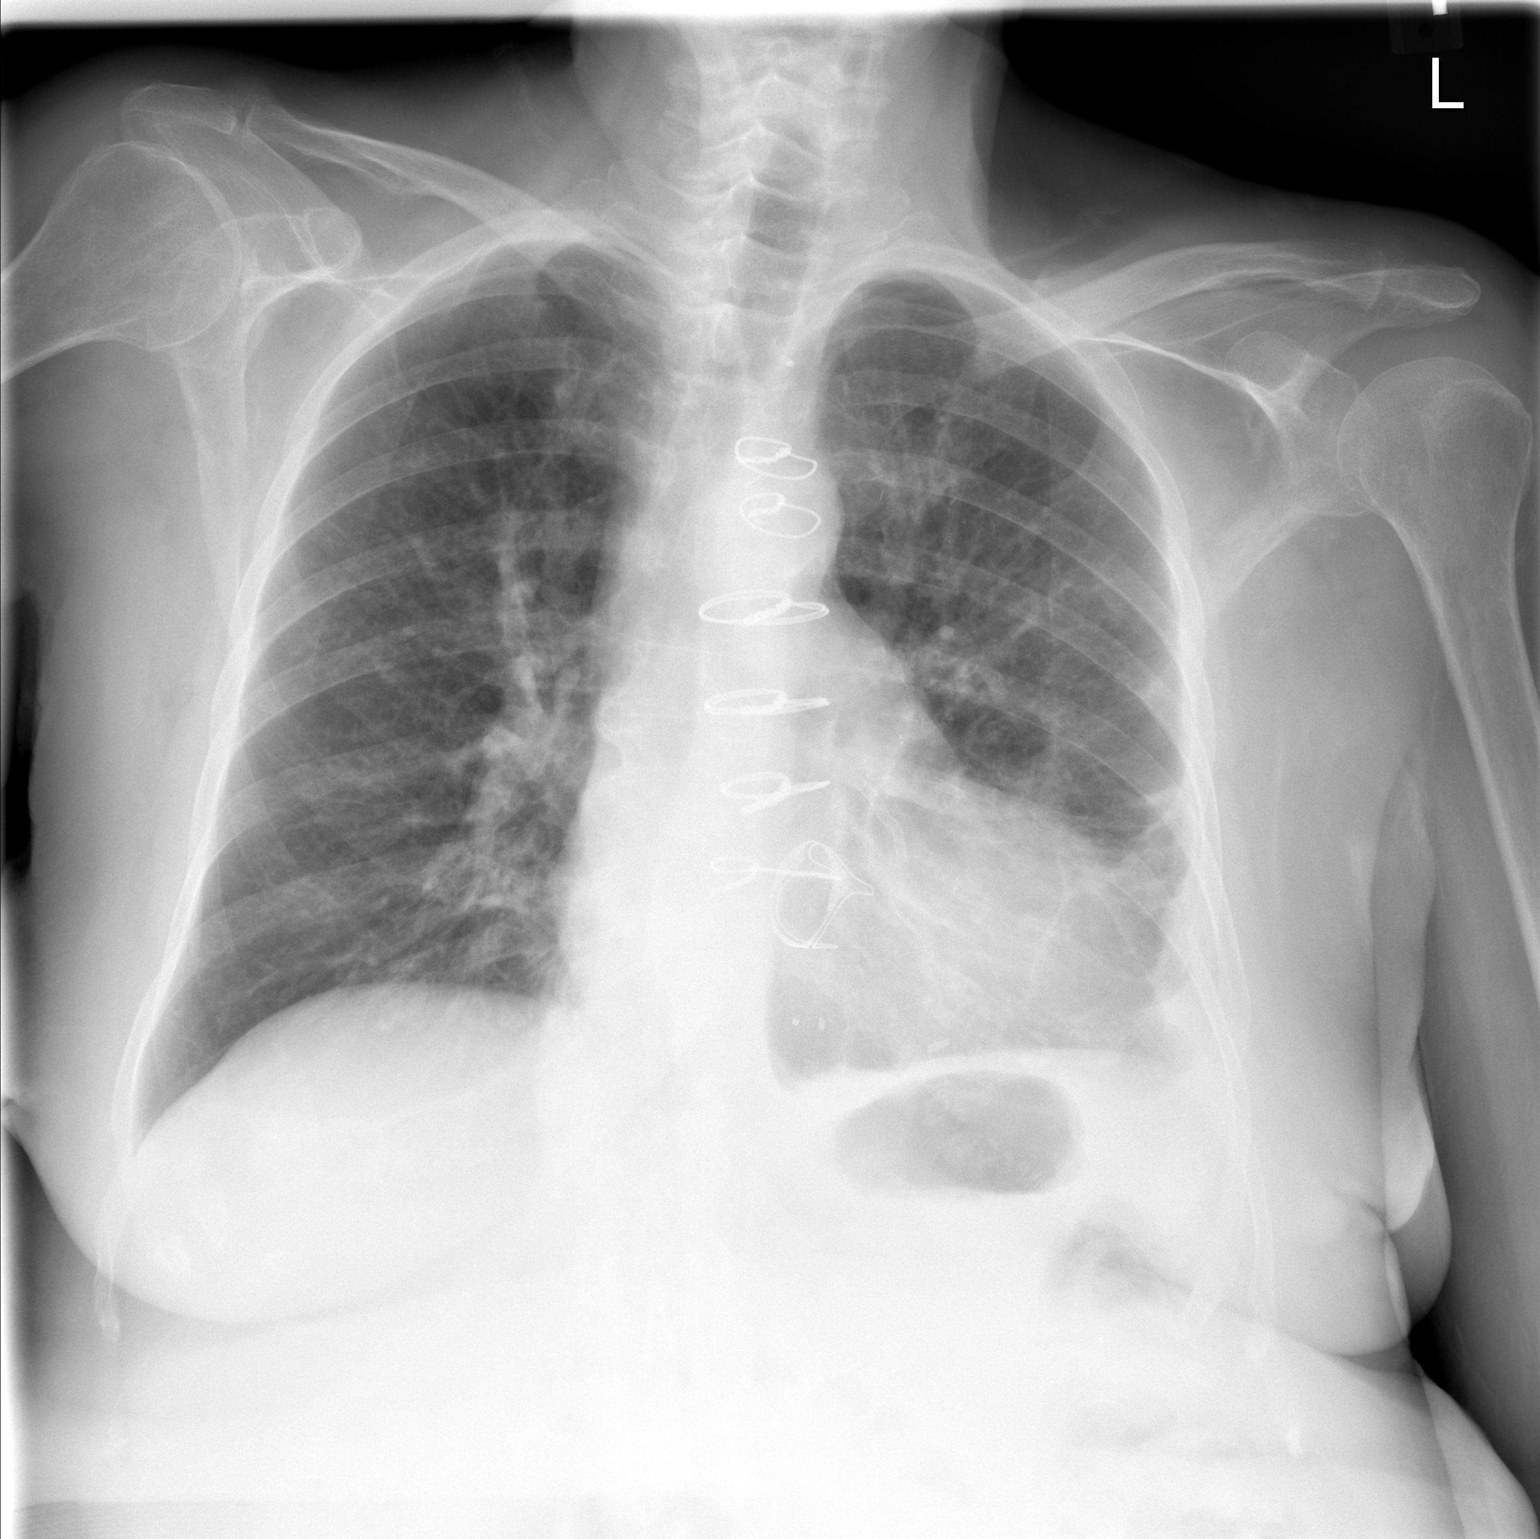

[2 of 2 positions shown; findings below may reference images not displayed]

FINDINGS: Left coronary artery stents and mitral valve replacement again
noted. Stable mild cardiac enlargement. Vascular pattern is normal.

Right lung is clear. On the left, there is stable chronic appearing
opacity in the lower lobe most consistent with scarring. Trace
pleural effusions stable to smaller.
IMPRESSION: Chronic findings with no acute process. Left lower lobe scarring
stable.

## 2014-11-09 DIAGNOSIS — R269 Unspecified abnormalities of gait and mobility: Secondary | ICD-10-CM | POA: Diagnosis not present

## 2014-11-09 DIAGNOSIS — I2510 Atherosclerotic heart disease of native coronary artery without angina pectoris: Secondary | ICD-10-CM | POA: Diagnosis not present

## 2014-11-09 DIAGNOSIS — I890 Lymphedema, not elsewhere classified: Secondary | ICD-10-CM | POA: Diagnosis not present

## 2014-11-09 DIAGNOSIS — I509 Heart failure, unspecified: Secondary | ICD-10-CM | POA: Diagnosis not present

## 2014-11-14 DIAGNOSIS — E119 Type 2 diabetes mellitus without complications: Secondary | ICD-10-CM | POA: Diagnosis not present

## 2014-11-14 DIAGNOSIS — I509 Heart failure, unspecified: Secondary | ICD-10-CM | POA: Diagnosis not present

## 2014-11-14 DIAGNOSIS — I10 Essential (primary) hypertension: Secondary | ICD-10-CM | POA: Diagnosis not present

## 2014-11-14 DIAGNOSIS — I4891 Unspecified atrial fibrillation: Secondary | ICD-10-CM | POA: Diagnosis not present

## 2014-11-16 DIAGNOSIS — G4734 Idiopathic sleep related nonobstructive alveolar hypoventilation: Secondary | ICD-10-CM | POA: Diagnosis not present

## 2014-11-17 ENCOUNTER — Encounter: Payer: Self-pay | Admitting: *Deleted

## 2014-11-17 NOTE — Patient Outreach (Signed)
[place] [facility]) Care Management  11/17/2014  SAMEEN LEAS [birth year] [id number]   MD letter sent; case closed. [name], RN BSN [place] Management Coordinator [facility] Care Management  [phone]

## 2014-11-17 NOTE — Patient Outreach (Signed)
[place] [facility]) Care Management  11/17/2014  Claudia [name] [birth year] [id number]   No response to telephone calls or outreach letter.  Plan: Will send MD closure letter and close out case.  [name], RN BSN [place] Management Coordinator [facility] Care Management  [phone]

## 2014-11-18 ENCOUNTER — Encounter: Payer: Self-pay | Admitting: Internal Medicine

## 2014-12-09 DIAGNOSIS — I890 Lymphedema, not elsewhere classified: Secondary | ICD-10-CM | POA: Diagnosis not present

## 2014-12-09 DIAGNOSIS — R269 Unspecified abnormalities of gait and mobility: Secondary | ICD-10-CM | POA: Diagnosis not present

## 2014-12-09 DIAGNOSIS — I509 Heart failure, unspecified: Secondary | ICD-10-CM | POA: Diagnosis not present

## 2014-12-09 DIAGNOSIS — I2510 Atherosclerotic heart disease of native coronary artery without angina pectoris: Secondary | ICD-10-CM | POA: Diagnosis not present

## 2014-12-11 DIAGNOSIS — I509 Heart failure, unspecified: Secondary | ICD-10-CM | POA: Diagnosis not present

## 2014-12-11 DIAGNOSIS — I10 Essential (primary) hypertension: Secondary | ICD-10-CM | POA: Diagnosis not present

## 2014-12-11 DIAGNOSIS — E119 Type 2 diabetes mellitus without complications: Secondary | ICD-10-CM | POA: Diagnosis not present

## 2014-12-11 DIAGNOSIS — I4891 Unspecified atrial fibrillation: Secondary | ICD-10-CM | POA: Diagnosis not present

## 2014-12-16 DIAGNOSIS — G4734 Idiopathic sleep related nonobstructive alveolar hypoventilation: Secondary | ICD-10-CM | POA: Diagnosis not present

## 2014-12-18 DIAGNOSIS — I10 Essential (primary) hypertension: Secondary | ICD-10-CM | POA: Diagnosis not present

## 2014-12-18 DIAGNOSIS — E1122 Type 2 diabetes mellitus with diabetic chronic kidney disease: Secondary | ICD-10-CM | POA: Diagnosis not present

## 2014-12-18 DIAGNOSIS — R600 Localized edema: Secondary | ICD-10-CM | POA: Diagnosis not present

## 2014-12-18 DIAGNOSIS — I2510 Atherosclerotic heart disease of native coronary artery without angina pectoris: Secondary | ICD-10-CM | POA: Diagnosis not present

## 2014-12-24 ENCOUNTER — Telehealth: Payer: Self-pay | Admitting: Family Medicine

## 2014-12-24 ENCOUNTER — Telehealth: Payer: Self-pay | Admitting: *Deleted

## 2014-12-24 NOTE — Telephone Encounter (Signed)
Submitted humana referral thru acuity connect for authorization on 12/17/14 to Dr. Melvyn Neth with authorization [id number]  Requesting provider: Julious Payer  Treating provider: Kela Millin, MD  Number of visits:6  Start Date: 12/18/14  End Date: 06/16/15  Dx: I25.10-Athscl heart disease of native coronary artery w/o ang petrs  PCP: Flonnie Hailstone

## 2014-12-24 NOTE — Telephone Encounter (Signed)
Patients son arnold calling to say that Claudia [name] has diarrhea and would like to speak to you regarding this  [phone]

## 2014-12-24 NOTE — Telephone Encounter (Signed)
Called and spoke to son and he stated that pt has had Diarrhea since Saturday and then all day on Sunday and then it stopped then last night it started back but by mid morning it had eased up some, I advised pt to try using [employer] AD and follow the package instructions and if not relieved by morning to give [name] a call, son states she has appt scheduled for Friday any ways and will wait then if not relived.

## 2014-12-26 ENCOUNTER — Ambulatory Visit (INDEPENDENT_AMBULATORY_CARE_PROVIDER_SITE_OTHER): Payer: Commercial Managed Care - HMO | Admitting: Family Medicine

## 2014-12-26 ENCOUNTER — Encounter: Payer: Self-pay | Admitting: Family Medicine

## 2014-12-26 VITALS — BP 100/62 | HR 60 | Temp 97.8°F | Resp 16 | Wt 164.0 lb

## 2014-12-26 DIAGNOSIS — Z794 Long term (current) use of insulin: Secondary | ICD-10-CM

## 2014-12-26 DIAGNOSIS — Z23 Encounter for immunization: Secondary | ICD-10-CM

## 2014-12-26 DIAGNOSIS — E119 Type 2 diabetes mellitus without complications: Secondary | ICD-10-CM | POA: Diagnosis not present

## 2014-12-26 DIAGNOSIS — IMO0001 Reserved for inherently not codable concepts without codable children: Secondary | ICD-10-CM

## 2014-12-26 DIAGNOSIS — N184 Chronic kidney disease, stage 4 (severe): Secondary | ICD-10-CM

## 2014-12-26 DIAGNOSIS — I509 Heart failure, unspecified: Secondary | ICD-10-CM | POA: Diagnosis not present

## 2014-12-26 NOTE — Progress Notes (Signed)
Subjective:    Patient ID: Claudia [name], female    DOB: 1931-08-23, 81 y.o.   MRN: [id number]  HPI  08/2014 Patient is here today for complete physical exam. She looks much better from the last time I saw her. She is now able to stand up from a seated position just holding onto the counter. This is a marked improvement from when she was wheelchair-bound just a month ago. She is more alert. Her memory has improved. She is exercising around the house by walking with a walker. She has not had a urinary tract infection and almost a month. Overall she seems to be improving dramatically. She is still very weak and she still requires a walker to ambulate but again I cannot emphasize how much improvement she is demonstrating from her last visit. We had a long discussion today regarding cancer screening. Just earlier this year I was discussing hospice placement with this patient. Therefore I do not believe it makes medical sense to pursue cancer screening such as a mammogram, Pap smear, colonoscopy. Both the patient and her son are in agreement. She is due for Pneumovax 23 today. She will receive that. She has had Prevnar 13. Otherwise her preventative care is up-to-date.  At that time, my plan was: Physical exam is significant for several cancerous appearing lesions on her left cheek. One appears to be a basal cell cancer. The other appears to be a squamous cell cancer. Given their size and location, I will refer the patient to a dermatologist. Patient received Pneumovax 23 today. Her weight has stabilized and has actually improved 3 pounds. She is no longer rapidly losing weight. Her pulmonary edema has resolved. She does have some trace peripheral edema but this is good for this patient and is at her baseline. Her muscle strength and deconditioning appears to be improving. I will obtain a CBC, CMP, fasting lipid panel, hemoglobin A1c, and a urinalysis. Goal hemoglobin A1c is less than 8 given the patient's  frailty. At the present time on Lantus 18 units twice daily, her blood sugars are all less than 200 and I believe this is acceptable. I will check a urinalysis to rule out a urinary tract infection as this is seem to be the cause of the patient's decompensation recently and I want to be proactive about finding that prior to it causing her to decompensate.  12/26/14 Patient's weight has fallen to 164 pounds. She reports dry mouth as well as some orthostatic dizziness. Her blood pressure today is relatively low at 100/62. Her heart rate typically runs between 50 and 60 bpm. She is currently on metoprolol 12.5 mg by mouth twice a day as well as torsemide by mouth twice a day.  However the patient has done remarkably well since when I last saw her. She is now walking around her home without the use of a walker which I feel is a minor miracle given how decompensated she was earlier this year. At that time I was even considering hospice but the patient has rallied  And her muscle tone and strength has improved dramatically. Earlier last week she was even walking around [place] simply using a buggy for stability.  Her blood pressure typically ranges between 68-341 systolic over 96-22 diastolic.  Her blood sugars have been fluctuating between 70 and 200 although typically they're less than 170 which I feel is entirely appropriate given her age and comorbidities. Her last hemoglobin A1c was excellent at 7.1. She is due today  for her flu shot Past Medical History  Diagnosis Date  . GERD (gastroesophageal reflux disease)   . PUD (peptic ulcer disease)   . Anemia   . Hypertension   . S/P endoscopy Aug 2011    3 superficial gastric ulcers, NSAID-induced  . S/P colonoscopy Sept 2011    left-sided diverticula, tubular adenoma  . Coronary artery disease   . Shortness of breath   . Diabetes mellitus     insulin dependent  . Headache(784.0)     rare migraines  . Cancer     hx of skin cancer  . Arthritis   .  Osteopenia   . Hypercholesterolemia   . SIADH (syndrome of inappropriate ADH production)   . CHF (congestive heart failure)   . Myocardial infarction 2013  . Chronic renal failure, stage 3 (moderate)    Past Surgical History  Procedure Laterality Date  . Tonsillectomy    . Appendectomy    . Eye surgery      cataracts  . Cardiac stents  07/19/2011  . Esophagogastroduodenoscopy  10/16/09    normal without barrett's/three superficial gastric ulcers  . Colonoscopy  12/04/09    normal rectum/left-sided diverticula  . Joint replacement  arthroscopy to knee  . Toe amputation Left 2013    left second toe amputation  . Cardiac catheterization  01/22/2013  . Coronary angioplasty  07/2011  . Coronary artery bypass graft N/A 11/08/2013    Procedure: CORONARY ARTERY BYPASS GRAFTING (CABG), on pump, times two, using left internal mammary artery, cryo saphenous vein.;  Surgeon: Ivin Poot, MD;  Location: [place];  Service: Open Heart Surgery;  Laterality: N/A;  LIMA-LAD CRYOVEIN -OM  . Intraoperative transesophageal echocardiogram N/A 11/08/2013    Procedure: INTRAOPERATIVE TRANSESOPHAGEAL ECHOCARDIOGRAM;  Surgeon: Ivin Poot, MD;  Location: [facility] [place];  Service: Open Heart Surgery;  Laterality: N/A;  . Mitral valve replacement N/A 11/08/2013    Procedure: MITRAL VALVE (MV) REPLACEMENT;  Surgeon: Ivin Poot, MD;  Location: [facility];  Service: Open Heart Surgery;  Laterality: N/A;  #25 MAGNA MITRAL EASE  . Left heart catheterization with coronary angiogram N/A 07/19/2011    Procedure: LEFT HEART CATHETERIZATION WITH CORONARY ANGIOGRAM;  Surgeon: Laverda Page, MD;  Location: [facility] CATH LAB;  Service: Cardiovascular;  Laterality: N/A;  . Left heart catheterization with coronary angiogram N/A 12/21/2011    Procedure: LEFT HEART CATHETERIZATION WITH CORONARY ANGIOGRAM;  Surgeon: Laverda Page, MD;  Location: [facility] CATH LAB;  Service: Cardiovascular;  Laterality: N/A;  . Left heart catheterization with  coronary angiogram N/A 02/20/2012    Procedure: LEFT HEART CATHETERIZATION WITH CORONARY ANGIOGRAM;  Surgeon: Laverda Page, MD;  Location: [facility] CATH LAB;  Service: Cardiovascular;  Laterality: N/A;  . Left heart catheterization with coronary angiogram N/A 09/03/2012    Procedure: LEFT HEART CATHETERIZATION WITH CORONARY ANGIOGRAM;  Surgeon: Laverda Page, MD;  Location: [facility] CATH LAB;  Service: Cardiovascular;  Laterality: N/A;  . Percutaneous coronary stent intervention (pci-s)  09/03/2012    Procedure: PERCUTANEOUS CORONARY STENT INTERVENTION (PCI-S);  Surgeon: Laverda Page, MD;  Location: [facility] CATH LAB;  Service: Cardiovascular;;  . Left heart catheterization with coronary angiogram N/A 12/04/2012    Procedure: LEFT HEART CATHETERIZATION WITH CORONARY ANGIOGRAM;  Surgeon: Laverda Page, MD;  Location: [facility] CATH LAB;  Service: Cardiovascular;  Laterality: N/A;  . Left heart catheterization with coronary angiogram N/A 01/22/2013    Procedure: LEFT HEART CATHETERIZATION WITH CORONARY ANGIOGRAM;  Surgeon: Turner Daniels  Einar Gip, MD;  Location: [place] CATH LAB;  Service: Cardiovascular;  Laterality: N/A;  . Left heart catheterization with coronary angiogram N/A 06/26/2013    Procedure: LEFT HEART CATHETERIZATION WITH CORONARY ANGIOGRAM;  Surgeon: Laverda Page, MD;  Location: [facility] CATH LAB;  Service: Cardiovascular;  Laterality: N/A;  . Left heart catheterization with coronary angiogram N/A 11/05/2013    Procedure: LEFT HEART CATHETERIZATION WITH CORONARY ANGIOGRAM;  Surgeon: Laverda Page, MD;  Location: [facility] CATH LAB;  Service: Cardiovascular;  Laterality: N/A;   Current Outpatient Prescriptions on File Prior to Visit  Medication Sig Dispense Refill  . ACCU-CHEK FASTCLIX LANCETS MISC 1 each by Does not apply route 4 (four) times daily -  before meals and at bedtime. Check BS QID - Fasting, before meals and QHS 150 each 11  . acetaminophen (TYLENOL) 500 MG tablet Take 2 tablets (1,000 mg total) by  mouth every 6 (six) hours as needed. 60 tablet 11  . ascorbic acid (VITAMIN C) 500 MG tablet Take 1 tablet (500 mg total) by mouth 2 (two) times daily. 60 tablet 11  . aspirin 81 MG chewable tablet Chew 2 tablets (162 mg total) by mouth daily. 60 tablet 11  . B-D ULTRAFINE III SHORT PEN 31G X 8 MM MISC 5 each by Does not apply route 5 (five) times daily. 150 each 11  . Blood Glucose Monitoring Suppl (ACCU-CHEK AVIVA) device Use as instructed 1 each 0  . cetirizine (ZYRTEC) 10 MG tablet Take 1 tablet (10 mg total) by mouth at bedtime. 30 tablet 11  . feeding supplement, GLUCERNA SHAKE, (GLUCERNA SHAKE) LIQD Take 237 mLs by mouth 3 (three) times daily with meals.  0  . ferrous sulfate 325 (65 FE) MG tablet Take 1 tablet (325 mg total) by mouth 2 (two) times daily with a meal. 60 tablet 11  . fluticasone (FLONASE) 50 MCG/ACT nasal spray Place 1 spray into both nostrils daily. (Patient not taking: Reported on 08/22/2014) 16 g 5  . glucose blood (ACCU-CHEK SMARTVIEW) test strip 1 each by Other route 4 (four) times daily -  before meals and at bedtime. Check BS QID - Dx: E11.9 150 each 11  . HYDROmorphone (DILAUDID) 2 MG tablet Take 1 tablet (2 mg total) by mouth every 4 (four) hours as needed for severe pain. 30 tablet 0  . insulin aspart (NOVOLOG FLEXPEN) 100 UNIT/ML FlexPen 70-120=0    251-300=5 121-150=1  301-350=7 151-200=2  351-400=9 201-250=3  400 or >, call MD  units of insulin (Patient not taking: Reported on 08/22/2014) 15 mL 11  . Insulin Glargine (LANTUS) 100 UNIT/ML Solostar Pen Inject 12 Units into the skin 2 (two) times daily. (Patient taking differently: Inject 18 Units into the skin 2 (two) times daily. ) 15 mL 5  . Melatonin 3 MG TABS Take 1.5 mg by mouth at bedtime as needed (insomnia).     . metoprolol tartrate (LOPRESSOR) 25 MG tablet Take 1 tablet (25 mg total) by mouth 2 (two) times daily. (Patient taking differently: Take 12.5 mg by mouth 2 (two) times daily. ) 60 tablet 3  .  nitroGLYCERIN (NITROSTAT) 0.4 MG SL tablet Place 1 tablet (0.4 mg total) under the tongue every 5 (five) minutes as needed for chest pain. 25 tablet 2  . ondansetron (ZOFRAN) 4 MG tablet Take 1 tablet (4 mg total) by mouth every 6 (six) hours as needed for nausea or vomiting. 30 tablet 3  . pantoprazole (PROTONIX) 40 MG tablet Take 1 tablet (40  mg total) by mouth daily. 30 tablet 3  . polyethylene glycol (MIRALAX / GLYCOLAX) packet Take 17 g by mouth every other day. 24 each 11  . simvastatin (ZOCOR) 20 MG tablet Take 1 tablet (20 mg total) by mouth daily. 30 tablet 5  . torsemide (DEMADEX) 20 MG tablet Take 1 tablet (20 mg total) by mouth 2 (two) times daily. 60 tablet 11  . torsemide (DEMADEX) 20 MG tablet TAKE 1 TABLET(20 MG) BY MOUTH TWICE DAILY 60 tablet 11  . traZODone (DESYREL) 50 MG tablet TAKE 1 TABLET(50 MG) BY MOUTH AT BEDTIME 30 tablet 2   No current facility-administered medications on file prior to visit.   Allergies  Allergen Reactions  . Aspirin Other (See Comments)    High doses caused stomach bleeds   Social History   Social History  . Marital Status: Widowed    Spouse Name: N/A  . Number of Children: N/A  . Years of Education: N/A   Occupational History  . Not on file.   Social History Main Topics  . Smoking status: Never Smoker   . Smokeless tobacco: Never Used  . Alcohol Use: No  . Drug Use: No  . Sexual Activity: No   Other Topics Concern  . Not on file   Social History Narrative   No family history on file.    Review of Systems  All other systems reviewed and are negative.      Objective:   Physical Exam  Constitutional: She is oriented to person, place, and time. She appears well-developed and well-nourished. No distress.  HENT:  Head: Normocephalic and atraumatic.  Right Ear: External ear normal.  Left Ear: External ear normal.  Nose: Nose normal.  Mouth/Throat: Oropharynx is clear and moist.  Eyes: Conjunctivae and EOM are normal.  Pupils are equal, round, and reactive to light. Right eye exhibits no discharge. Left eye exhibits no discharge. No scleral icterus.  Neck: Normal range of motion. Neck supple. No JVD present. No tracheal deviation present. No thyromegaly present.  Cardiovascular: Normal rate, regular rhythm, normal heart sounds and intact distal pulses.  Exam reveals no gallop and no friction rub.   No murmur heard. Pulmonary/Chest: Effort normal and breath sounds normal. No stridor. No respiratory distress. She has no wheezes. She has no rales. She exhibits no tenderness.  Abdominal: Soft. Bowel sounds are normal. She exhibits no distension and no mass. There is no tenderness. There is no rebound and no guarding.  Musculoskeletal: She exhibits no edema.  Lymphadenopathy:    She has no cervical adenopathy.  Neurological: She is alert and oriented to person, place, and time. She has normal reflexes. No cranial nerve deficit. She exhibits normal muscle tone. Coordination normal.  Skin: Skin is warm. No rash noted. She is not diaphoretic. No erythema. No pallor.  Psychiatric: She has a normal mood and affect. Her behavior is normal. Judgment and thought content normal.  Vitals reviewed.        Assessment & Plan:  Need for immunization against influenza - Plan: Flu Vaccine QUAD 36+ mos PF IM (Fluarix & Fluzone Quad PF)  CHF (congestive heart failure), NYHA class III, unspecified failure chronicity, unspecified type (HCC)  Chronic kidney disease (CKD), stage IV (severe) (HCC)  IDDM (insulin dependent diabetes mellitus) ([place])  Given her low blood pressure and her  Relative bradycardia, I will continue metoprolol 12.5 mg by mouth twice a day. However given her chronic kidney disease, or loss of weight, and her dry  mouth, I would recommend decreasing torsemide to once a day. Her insulin seems appropriately dosed based on her blood sugars. She's been taking 10 units in the morning and 8 units in the evening. I  have recommended just taking 18 units once a day for simplicity sake. She also received her flu shot today. Recheck fasting lab work after the first of the year.

## 2014-12-29 ENCOUNTER — Other Ambulatory Visit: Payer: Self-pay | Admitting: Physician Assistant

## 2014-12-29 ENCOUNTER — Other Ambulatory Visit: Payer: Self-pay | Admitting: Family Medicine

## 2014-12-29 NOTE — Telephone Encounter (Signed)
Medication refilled per protocol. 

## 2014-12-30 NOTE — Patient Outreach (Signed)
[place] [facility]) Care Management  12/30/2014  NAHOMI HEGNER 08-Dec-1931 [id number]   Referral from [facility] tier 4 list, assigned to [name], RN for patient outreach.  Kyndra Condron L. Korianna Washer, [place] Care Management Assistant

## 2015-01-05 ENCOUNTER — Other Ambulatory Visit: Payer: Self-pay | Admitting: *Deleted

## 2015-01-05 NOTE — Patient Outreach (Signed)
[place]) Care Management  01/05/2015  Claudia [name] [birth year] [id number]   Referral from [place] [facility]: Telephone call to patient; left message on voice mail requesting return call.  Plan: will follow up with patient. [name], RN BSN [place] Management Coordinator [facility] Care Management  [phone]

## 2015-01-06 ENCOUNTER — Other Ambulatory Visit: Payer: Self-pay | Admitting: *Deleted

## 2015-01-06 NOTE — Patient Outreach (Signed)
[place] [facility]) Care Management  01/06/2015  Claudia [name] 1931/12/08 [id number]  Telephone call to patient; person who answered phone stated patient was not available but took number and will have patient return my call.  Plan: Will follow up.  [name], RN BSN [place] Management Coordinator [facility] Care Management  [phone]

## 2015-01-09 ENCOUNTER — Encounter: Payer: Self-pay | Admitting: *Deleted

## 2015-01-09 ENCOUNTER — Other Ambulatory Visit: Payer: Self-pay | Admitting: *Deleted

## 2015-01-09 DIAGNOSIS — I890 Lymphedema, not elsewhere classified: Secondary | ICD-10-CM | POA: Diagnosis not present

## 2015-01-09 DIAGNOSIS — I2510 Atherosclerotic heart disease of native coronary artery without angina pectoris: Secondary | ICD-10-CM | POA: Diagnosis not present

## 2015-01-09 DIAGNOSIS — I509 Heart failure, unspecified: Secondary | ICD-10-CM | POA: Diagnosis not present

## 2015-01-09 DIAGNOSIS — R269 Unspecified abnormalities of gait and mobility: Secondary | ICD-10-CM | POA: Diagnosis not present

## 2015-01-09 NOTE — Patient Outreach (Signed)
[place] [facility]) Care Management  01/09/2015  Kaeli ALYDA MEGNA [birth year] [id number]   Telephone call to [place] message on voice mail requesting call back.  Call made to alternate number-no answer-fast busy signal.  Plan: Will send outreach letter and follow up.   [name], RN BSN [place] Management Coordinator [facility] Care Management  [phone]

## 2015-01-16 DIAGNOSIS — G4734 Idiopathic sleep related nonobstructive alveolar hypoventilation: Secondary | ICD-10-CM | POA: Diagnosis not present

## 2015-01-19 ENCOUNTER — Telehealth: Payer: Self-pay | Admitting: Family Medicine

## 2015-01-19 MED ORDER — FLUTICASONE PROPIONATE 50 MCG/ACT NA SUSP: 1.0000 | Freq: Every day | NASAL | Status: DC

## 2015-01-19 NOTE — Telephone Encounter (Signed)
Medication called/sent to requested pharmacy  

## 2015-01-19 NOTE — Telephone Encounter (Signed)
PHARMACY: [facility]   MEDICATION: FLONASE   QTY:    SIG:    PHYSICIAN: PICKARD   PT. PHONE #: [phone]

## 2015-01-26 ENCOUNTER — Other Ambulatory Visit: Payer: Self-pay | Admitting: Family Medicine

## 2015-01-26 NOTE — Telephone Encounter (Signed)
Refill appropriate and filled per protocol. 

## 2015-01-28 ENCOUNTER — Encounter: Payer: Self-pay | Admitting: *Deleted

## 2015-01-28 ENCOUNTER — Other Ambulatory Visit: Payer: Self-pay | Admitting: *Deleted

## 2015-01-28 NOTE — Patient Outreach (Signed)
[facility]) Care Management  01/28/2015  Claudia [name] 17-Oct-1931 [id number]   No response from patient to telephone calls or outreach letter.  Plan: Send MD closure letter.  Notify care management assistant to close out case. [name], RN BSN [place] Management Coordinator [facility] Care Management  [phone]

## 2015-02-08 DIAGNOSIS — I509 Heart failure, unspecified: Secondary | ICD-10-CM | POA: Diagnosis not present

## 2015-02-08 DIAGNOSIS — I2510 Atherosclerotic heart disease of native coronary artery without angina pectoris: Secondary | ICD-10-CM | POA: Diagnosis not present

## 2015-02-08 DIAGNOSIS — R269 Unspecified abnormalities of gait and mobility: Secondary | ICD-10-CM | POA: Diagnosis not present

## 2015-02-08 DIAGNOSIS — I890 Lymphedema, not elsewhere classified: Secondary | ICD-10-CM | POA: Diagnosis not present

## 2015-02-13 ENCOUNTER — Other Ambulatory Visit: Payer: Self-pay | Admitting: Family Medicine

## 2015-02-13 NOTE — Telephone Encounter (Signed)
Medication refilled per protocol. 

## 2015-02-15 DIAGNOSIS — G4734 Idiopathic sleep related nonobstructive alveolar hypoventilation: Secondary | ICD-10-CM | POA: Diagnosis not present

## 2015-02-24 ENCOUNTER — Encounter: Payer: Self-pay | Admitting: *Deleted

## 2015-02-24 DIAGNOSIS — I700 Atherosclerosis of aorta: Secondary | ICD-10-CM | POA: Insufficient documentation

## 2015-03-06 ENCOUNTER — Other Ambulatory Visit: Payer: Self-pay | Admitting: Physician Assistant

## 2015-03-07 ENCOUNTER — Other Ambulatory Visit: Payer: Self-pay | Admitting: Family Medicine

## 2015-03-09 ENCOUNTER — Other Ambulatory Visit: Payer: Self-pay | Admitting: Family Medicine

## 2015-03-10 NOTE — Telephone Encounter (Signed)
Prescription sent to pharmacy.

## 2015-03-10 NOTE — Telephone Encounter (Signed)
ok 

## 2015-03-10 NOTE — Telephone Encounter (Signed)
?   OK to Refill  

## 2015-03-11 DIAGNOSIS — R269 Unspecified abnormalities of gait and mobility: Secondary | ICD-10-CM | POA: Diagnosis not present

## 2015-03-11 DIAGNOSIS — I2510 Atherosclerotic heart disease of native coronary artery without angina pectoris: Secondary | ICD-10-CM | POA: Diagnosis not present

## 2015-03-11 DIAGNOSIS — I509 Heart failure, unspecified: Secondary | ICD-10-CM | POA: Diagnosis not present

## 2015-03-11 DIAGNOSIS — I890 Lymphedema, not elsewhere classified: Secondary | ICD-10-CM | POA: Diagnosis not present

## 2015-03-18 DIAGNOSIS — G4734 Idiopathic sleep related nonobstructive alveolar hypoventilation: Secondary | ICD-10-CM | POA: Diagnosis not present

## 2015-03-19 DIAGNOSIS — I10 Essential (primary) hypertension: Secondary | ICD-10-CM | POA: Diagnosis not present

## 2015-03-19 DIAGNOSIS — E7800 Pure hypercholesterolemia, unspecified: Secondary | ICD-10-CM | POA: Diagnosis not present

## 2015-03-19 DIAGNOSIS — I2510 Atherosclerotic heart disease of native coronary artery without angina pectoris: Secondary | ICD-10-CM | POA: Diagnosis not present

## 2015-03-19 DIAGNOSIS — R600 Localized edema: Secondary | ICD-10-CM | POA: Diagnosis not present

## 2015-04-02 DIAGNOSIS — E7800 Pure hypercholesterolemia, unspecified: Secondary | ICD-10-CM | POA: Diagnosis not present

## 2015-04-02 DIAGNOSIS — I2510 Atherosclerotic heart disease of native coronary artery without angina pectoris: Secondary | ICD-10-CM | POA: Diagnosis not present

## 2015-04-06 ENCOUNTER — Other Ambulatory Visit: Payer: Self-pay | Admitting: Family Medicine

## 2015-04-06 NOTE — Telephone Encounter (Signed)
Refill appropriate and filled per protocol. 

## 2015-04-10 DIAGNOSIS — R600 Localized edema: Secondary | ICD-10-CM | POA: Diagnosis not present

## 2015-04-10 DIAGNOSIS — I2510 Atherosclerotic heart disease of native coronary artery without angina pectoris: Secondary | ICD-10-CM | POA: Diagnosis not present

## 2015-04-10 DIAGNOSIS — E7800 Pure hypercholesterolemia, unspecified: Secondary | ICD-10-CM | POA: Diagnosis not present

## 2015-04-10 DIAGNOSIS — I10 Essential (primary) hypertension: Secondary | ICD-10-CM | POA: Diagnosis not present

## 2015-04-11 DIAGNOSIS — I2510 Atherosclerotic heart disease of native coronary artery without angina pectoris: Secondary | ICD-10-CM | POA: Diagnosis not present

## 2015-04-11 DIAGNOSIS — I890 Lymphedema, not elsewhere classified: Secondary | ICD-10-CM | POA: Diagnosis not present

## 2015-04-11 DIAGNOSIS — R269 Unspecified abnormalities of gait and mobility: Secondary | ICD-10-CM | POA: Diagnosis not present

## 2015-04-11 DIAGNOSIS — I509 Heart failure, unspecified: Secondary | ICD-10-CM | POA: Diagnosis not present

## 2015-04-16 ENCOUNTER — Other Ambulatory Visit: Payer: Self-pay | Admitting: *Deleted

## 2015-04-16 MED ORDER — TRAZODONE HCL 50 MG PO TABS: ORAL_TABLET | ORAL | Status: DC

## 2015-04-16 MED ORDER — BD PEN NEEDLE SHORT U/F 31G X 8 MM MISC: 5.0000 | Freq: Every day | Status: DC

## 2015-04-16 MED ORDER — SIMVASTATIN 20 MG PO TABS: ORAL_TABLET | ORAL | Status: DC

## 2015-04-16 MED ORDER — INSULIN ASPART 100 UNIT/ML FLEXPEN: PEN_INJECTOR | SUBCUTANEOUS | Status: DC

## 2015-04-16 MED ORDER — ACCU-CHEK FASTCLIX LANCETS MISC: Status: DC

## 2015-04-16 MED ORDER — TORSEMIDE 20 MG PO TABS: 20.0000 mg | ORAL_TABLET | Freq: Two times a day (BID) | ORAL | Status: DC

## 2015-04-16 MED ORDER — FLUTICASONE PROPIONATE 50 MCG/ACT NA SUSP: 1.0000 | Freq: Every day | NASAL | Status: DC

## 2015-04-16 MED ORDER — METOPROLOL TARTRATE 25 MG PO TABS: ORAL_TABLET | ORAL | Status: DC

## 2015-04-16 MED ORDER — GLUCOSE BLOOD VI STRP: ORAL_STRIP | Status: DC

## 2015-04-16 MED ORDER — PANTOPRAZOLE SODIUM 40 MG PO TBEC: DELAYED_RELEASE_TABLET | ORAL | Status: DC

## 2015-04-16 MED ORDER — INSULIN GLARGINE 100 UNIT/ML SOLOSTAR PEN: PEN_INJECTOR | SUBCUTANEOUS | Status: DC

## 2015-04-16 NOTE — Telephone Encounter (Signed)
Received fax requesting refill on DM supplies and routine medications.   Refill appropriate and filled per protocol.

## 2015-04-18 DIAGNOSIS — G4734 Idiopathic sleep related nonobstructive alveolar hypoventilation: Secondary | ICD-10-CM | POA: Diagnosis not present

## 2015-04-27 ENCOUNTER — Encounter: Payer: Self-pay | Admitting: Family Medicine

## 2015-04-27 ENCOUNTER — Ambulatory Visit (INDEPENDENT_AMBULATORY_CARE_PROVIDER_SITE_OTHER): Payer: Commercial Managed Care - HMO | Admitting: Family Medicine

## 2015-04-27 VITALS — BP 160/62 | HR 58 | Temp 97.8°F | Resp 16 | Ht 67.0 in | Wt 167.0 lb

## 2015-04-27 DIAGNOSIS — N184 Chronic kidney disease, stage 4 (severe): Secondary | ICD-10-CM | POA: Diagnosis not present

## 2015-04-27 DIAGNOSIS — E119 Type 2 diabetes mellitus without complications: Secondary | ICD-10-CM | POA: Diagnosis not present

## 2015-04-27 DIAGNOSIS — I10 Essential (primary) hypertension: Secondary | ICD-10-CM

## 2015-04-27 DIAGNOSIS — Z794 Long term (current) use of insulin: Secondary | ICD-10-CM | POA: Diagnosis not present

## 2015-04-27 DIAGNOSIS — I509 Heart failure, unspecified: Secondary | ICD-10-CM

## 2015-04-27 DIAGNOSIS — IMO0001 Reserved for inherently not codable concepts without codable children: Secondary | ICD-10-CM

## 2015-04-27 MED ORDER — AMLODIPINE BESYLATE 5 MG PO TABS: 5.0000 mg | ORAL_TABLET | Freq: Every day | ORAL | Status: DC

## 2015-04-27 NOTE — Progress Notes (Signed)
Subjective:    Patient ID: Claudia [name], female    DOB: March 08, 1931, 82 y.o.   MRN: [id number]  HPI  08/2014 Patient is here today for complete physical exam. She looks much better from the last time I saw her. She is now able to stand up from a seated position just holding onto the counter. This is a marked improvement from when she was wheelchair-bound just a month ago. She is more alert. Her memory has improved. She is exercising around the house by walking with a walker. She has not had a urinary tract infection and almost a month. Overall she seems to be improving dramatically. She is still very weak and she still requires a walker to ambulate but again I cannot emphasize how much improvement she is demonstrating from her last visit. We had a long discussion today regarding cancer screening. Just earlier this year I was discussing hospice placement with this patient. Therefore I do not believe it makes medical sense to pursue cancer screening such as a mammogram, Pap smear, colonoscopy. Both the patient and her son are in agreement. She is due for Pneumovax 23 today. She will receive that. She has had Prevnar 13. Otherwise her preventative care is up-to-date.  At that time, my plan was: Physical exam is significant for several cancerous appearing lesions on her left cheek. One appears to be a basal cell cancer. The other appears to be a squamous cell cancer. Given their size and location, I will refer the patient to a dermatologist. Patient received Pneumovax 23 today. Her weight has stabilized and has actually improved 3 pounds. She is no longer rapidly losing weight. Her pulmonary edema has resolved. She does have some trace peripheral edema but this is good for this patient and is at her baseline. Her muscle strength and deconditioning appears to be improving. I will obtain a CBC, CMP, fasting lipid panel, hemoglobin A1c, and a urinalysis. Goal hemoglobin A1c is less than 8 given the patient's  frailty. At the present time on Lantus 18 units twice daily, her blood sugars are all less than 200 and I believe this is acceptable. I will check a urinalysis to rule out a urinary tract infection as this is seem to be the cause of the patient's decompensation recently and I want to be proactive about finding that prior to it causing her to decompensate.  12/26/14 Patient's weight has fallen to 164 pounds. She reports dry mouth as well as some orthostatic dizziness. Her blood pressure today is relatively low at 100/62. Her heart rate typically runs between 50 and 60 bpm. She is currently on metoprolol 12.5 mg by mouth twice a day as well as torsemide by mouth twice a day.  However the patient has done remarkably well since when I last saw her. She is now walking around her home without the use of a walker which I feel is a minor miracle given how decompensated she was earlier this year. At that time I was even considering hospice but the patient has rallied  And her muscle tone and strength has improved dramatically. Earlier last week she was even walking around [place] simply using a buggy for stability.  Her blood pressure typically ranges between [id number] systolic over XX123456 diastolic.  Her blood sugars have been fluctuating between 70 and 200 although typically they're less than 170 which I feel is entirely appropriate given her age and comorbidities. Her last hemoglobin A1c was excellent at 7.1. She is due today  for her flu shot.  At that time, my plan was: Given her low blood pressure and her  Relative bradycardia, I will continue metoprolol 12.5 mg by mouth twice a day. However given her chronic kidney disease, or loss of weight, and her dry mouth, I would recommend decreasing torsemide to once a day. Her insulin seems appropriately dosed based on her blood sugars. She's been taking 10 units in the morning and 8 units in the evening. I have recommended just taking 18 units once a day for simplicity sake.  She also received her flu shot today. Recheck fasting lab work after the first of the year.  04/27/15 Since I last saw the patient, she has been doing exceptionally well. She is currently taking Lantus 20 units subcutaneous daily. She may use 1-2 units of NovoLog with meals depending upon what her sugars are. Her most recent hemoglobin A1c which was checked at her cardiologist's office was found to be 6.9. Her fasting blood sugars are under 130 and her 2 hour postprandial sugars are well below 180.  She denies any hypoglycemia. Her strength continues to improve. She is now able to walk around [place] with minimal assistance. She is able to walk out and get into her son's truck without assistance. This is dramatically improved from last time I saw her. Her weight remains stable at 167 pounds. She denies any chest pain shortness of breath or dyspnea on exertion. However her blood pressure has been elevated recently. Her cardiologist recently started her on losartan 25 mg by mouth daily. Her blood pressure continues to remain high at 160/62. However her creatinine recently increased as well since her cardiologist started her on losartan. Past Medical History  Diagnosis Date  . GERD (gastroesophageal reflux disease)   . PUD (peptic ulcer disease)   . Anemia   . Hypertension   . S/P endoscopy Aug 2011    3 superficial gastric ulcers, NSAID-induced  . S/P colonoscopy Sept 2011    left-sided diverticula, tubular adenoma  . Coronary artery disease   . Shortness of breath   . Diabetes mellitus     insulin dependent  . Headache(784.0)     rare migraines  . Cancer (HCC)     hx of skin cancer  . Arthritis   . Osteopenia   . Hypercholesterolemia   . SIADH (syndrome of inappropriate ADH production) ([place])   . CHF (congestive heart failure) ([place])   . Myocardial infarction ([place]) 2013  . Chronic renal failure, stage 3 (moderate)    Past Surgical History  Procedure Laterality Date  . Tonsillectomy    .  Appendectomy    . Eye surgery      cataracts  . Cardiac stents  07/19/2011  . Esophagogastroduodenoscopy  10/16/09    normal without barrett's/three superficial gastric ulcers  . Colonoscopy  12/04/09    normal rectum/left-sided diverticula  . Joint replacement  arthroscopy to knee  . Toe amputation Left 2013    left second toe amputation  . Cardiac catheterization  01/22/2013  . Coronary angioplasty  07/2011  . Coronary artery bypass graft N/A 11/08/2013    Procedure: CORONARY ARTERY BYPASS GRAFTING (CABG), on pump, times two, using left internal mammary artery, cryo saphenous vein.;  Surgeon: Ivin Poot, MD;  Location: [facility];  Service: Open Heart Surgery;  Laterality: N/A;  LIMA-LAD CRYOVEIN -OM  . Intraoperative transesophageal echocardiogram N/A 11/08/2013    Procedure: INTRAOPERATIVE TRANSESOPHAGEAL ECHOCARDIOGRAM;  Surgeon: Ivin Poot, MD;  Location: [facility]  OR;  Service: Open Heart Surgery;  Laterality: N/A;  . Mitral valve replacement N/A 11/08/2013    Procedure: MITRAL VALVE (MV) REPLACEMENT;  Surgeon: Ivin Poot, MD;  Location: [facility];  Service: Open Heart Surgery;  Laterality: N/A;  #25 MAGNA MITRAL EASE  . Left heart catheterization with coronary angiogram N/A 07/19/2011    Procedure: LEFT HEART CATHETERIZATION WITH CORONARY ANGIOGRAM;  Surgeon: Laverda Page, MD;  Location: [facility] CATH LAB;  Service: Cardiovascular;  Laterality: N/A;  . Left heart catheterization with coronary angiogram N/A 12/21/2011    Procedure: LEFT HEART CATHETERIZATION WITH CORONARY ANGIOGRAM;  Surgeon: Laverda Page, MD;  Location: [facility] CATH LAB;  Service: Cardiovascular;  Laterality: N/A;  . Left heart catheterization with coronary angiogram N/A 02/20/2012    Procedure: LEFT HEART CATHETERIZATION WITH CORONARY ANGIOGRAM;  Surgeon: Laverda Page, MD;  Location: [facility] CATH LAB;  Service: Cardiovascular;  Laterality: N/A;  . Left heart catheterization with coronary angiogram N/A 09/03/2012    Procedure:  LEFT HEART CATHETERIZATION WITH CORONARY ANGIOGRAM;  Surgeon: Laverda Page, MD;  Location: [facility] CATH LAB;  Service: Cardiovascular;  Laterality: N/A;  . Percutaneous coronary stent intervention (pci-s)  09/03/2012    Procedure: PERCUTANEOUS CORONARY STENT INTERVENTION (PCI-S);  Surgeon: Laverda Page, MD;  Location: [facility] CATH LAB;  Service: Cardiovascular;;  . Left heart catheterization with coronary angiogram N/A 12/04/2012    Procedure: LEFT HEART CATHETERIZATION WITH CORONARY ANGIOGRAM;  Surgeon: Laverda Page, MD;  Location: [facility] CATH LAB;  Service: Cardiovascular;  Laterality: N/A;  . Left heart catheterization with coronary angiogram N/A 01/22/2013    Procedure: LEFT HEART CATHETERIZATION WITH CORONARY ANGIOGRAM;  Surgeon: Laverda Page, MD;  Location: [facility] CATH LAB;  Service: Cardiovascular;  Laterality: N/A;  . Left heart catheterization with coronary angiogram N/A 06/26/2013    Procedure: LEFT HEART CATHETERIZATION WITH CORONARY ANGIOGRAM;  Surgeon: Laverda Page, MD;  Location: [facility] CATH LAB;  Service: Cardiovascular;  Laterality: N/A;  . Left heart catheterization with coronary angiogram N/A 11/05/2013    Procedure: LEFT HEART CATHETERIZATION WITH CORONARY ANGIOGRAM;  Surgeon: Laverda Page, MD;  Location: [facility] CATH LAB;  Service: Cardiovascular;  Laterality: N/A;   Current Outpatient Prescriptions on File Prior to Visit  Medication Sig Dispense Refill  . ACCU-CHEK FASTCLIX LANCETS MISC Check BS 4x daily due to fluctuating blood sugars - Fasting, before meals and QHS- Dx: E11.65 500 each 3  . ascorbic acid (VITAMIN C) 500 MG tablet Take 1 tablet (500 mg total) by mouth 2 (two) times daily. 60 tablet 11  . aspirin 81 MG chewable tablet Chew 2 tablets (162 mg total) by mouth daily. 60 tablet 11  . B-D ULTRAFINE III SHORT PEN 31G X 8 MM MISC 5 each by Does not apply route 5 (five) times daily. Use to inject insulin 5x daily 500 each 3  . Blood Glucose Monitoring Suppl (ACCU-CHEK  AVIVA) device Use as instructed 1 each 0  . cetirizine (ZYRTEC) 10 MG tablet Take 1 tablet (10 mg total) by mouth at bedtime. 30 tablet 11  . feeding supplement, GLUCERNA SHAKE, (GLUCERNA SHAKE) LIQD Take 237 mLs by mouth 3 (three) times daily with meals.  0  . ferrous sulfate 325 (65 FE) MG tablet Take 1 tablet (325 mg total) by mouth 2 (two) times daily with a meal. 60 tablet 11  . glucose blood (ACCU-CHEK SMARTVIEW) test strip Check BS 4x daily due to fluctuating blood sugars - Fasting, before meals and QHS-  Dx: E11.65 500 each 3  . insulin aspart (NOVOLOG FLEXPEN) 100 UNIT/ML FlexPen 70-120=0    251-300=5 121-150=1  301-350=7 151-200=2  351-400=9 201-250=3  400 or >, call MD  units of insulin 45 mL 3  . Insulin Glargine (LANTUS SOLOSTAR) 100 UNIT/ML Solostar Pen INJECT 20 UNITS SUBCUTANEOUSLY TWICE DAILY 45 mL 3  . Melatonin 3 MG TABS Take 1.5 mg by mouth at bedtime as needed (insomnia).     . metoprolol tartrate (LOPRESSOR) 25 MG tablet TAKE 1 TABLET(25 MG) BY MOUTH TWICE DAILY (Patient taking differently: 12.5 mg. TAKE 1 TABLET(25 MG) BY MOUTH TWICE DAILY) 180 tablet 1  . nitroGLYCERIN (NITROSTAT) 0.4 MG SL tablet Place 1 tablet (0.4 mg total) under the tongue every 5 (five) minutes as needed for chest pain. 25 tablet 2  . pantoprazole (PROTONIX) 40 MG tablet TAKE 1 TABLET(40 MG) BY MOUTH DAILY 90 tablet 1  . simvastatin (ZOCOR) 20 MG tablet TAKE 1 TABLET(20 MG) BY MOUTH DAILY 90 tablet 1  . torsemide (DEMADEX) 20 MG tablet Take 1 tablet (20 mg total) by mouth 2 (two) times daily. (Patient taking differently: Take 20 mg by mouth 2 (two) times daily. M/W/F pt takes BIDother days QD) 180 tablet 1  . traZODone (DESYREL) 50 MG tablet TAKE 1 TABLET(50 MG) BY MOUTH AT BEDTIME 90 tablet 1  . fluticasone (FLONASE) 50 MCG/ACT nasal spray Place 1 spray into both nostrils daily. 48 g 3   No current facility-administered medications on file prior to visit.   Allergies  Allergen Reactions  . Aspirin  Other (See Comments)    High doses caused stomach bleeds   Social History   Social History  . Marital Status: Widowed    Spouse Name: N/A  . Number of Children: N/A  . Years of Education: N/A   Occupational History  . Not on file.   Social History Main Topics  . Smoking status: Never Smoker   . Smokeless tobacco: Never Used  . Alcohol Use: No  . Drug Use: No  . Sexual Activity: No   Other Topics Concern  . Not on file   Social History Narrative   No family history on file.    Review of Systems  All other systems reviewed and are negative.      Objective:   Physical Exam  Constitutional: She is oriented to person, place, and time. She appears well-developed and well-nourished. No distress.  HENT:  Head: Normocephalic and atraumatic.  Right Ear: External ear normal.  Left Ear: External ear normal.  Nose: Nose normal.  Mouth/Throat: Oropharynx is clear and moist.  Eyes: Conjunctivae and EOM are normal. Pupils are equal, round, and reactive to light. Right eye exhibits no discharge. Left eye exhibits no discharge. No scleral icterus.  Neck: Normal range of motion. Neck supple. No JVD present. No tracheal deviation present. No thyromegaly present.  Cardiovascular: Normal rate, regular rhythm, normal heart sounds and intact distal pulses.  Exam reveals no gallop and no friction rub.   No murmur heard. Pulmonary/Chest: Effort normal and breath sounds normal. No stridor. No respiratory distress. She has no wheezes. She has no rales. She exhibits no tenderness.  Abdominal: Soft. Bowel sounds are normal. She exhibits no distension and no mass. There is no tenderness. There is no rebound and no guarding.  Musculoskeletal: She exhibits no edema.  Lymphadenopathy:    She has no cervical adenopathy.  Neurological: She is alert and oriented to person, place, and time. She has normal  reflexes. No cranial nerve deficit. She exhibits normal muscle tone. Coordination normal.  Skin:  Skin is warm. No rash noted. She is not diaphoretic. No erythema. No pallor.  Psychiatric: She has a normal mood and affect. Her behavior is normal. Judgment and thought content normal.  Vitals reviewed.        Assessment & Plan:  Benign essential HTN - Plan: amLODipine (NORVASC) 5 MG tablet  CHF (congestive heart failure), NYHA class III, unspecified failure chronicity, unspecified type (HCC)  Chronic kidney disease (CKD), stage IV (severe) (HCC)  IDDM (insulin dependent diabetes mellitus) ([place]) I am very pleased with her blood sugars. I did ask her son to bring a copy of her lab work and so that I can look at her cholesterol, her CMP, fasting lipid panel, and her hemoglobin A1c but I would make no changes based on the numbers he is telling me. His blood pressure is slightly elevated and therefore I also added amlodipine 5 mg by mouth daily to help control her blood pressure without exacerbating her chronic kidney disease. Recheck blood pressure in one month but otherwise I am ecstatic about how well her sugars are controlled on how well she is doing. I will make no changes in her medication at this time. Her goal LDL cholesterol is less than 70.  Because of her CKD, I believe she is a poor candidate for entresto.

## 2015-04-28 ENCOUNTER — Other Ambulatory Visit: Payer: Self-pay | Admitting: *Deleted

## 2015-04-28 NOTE — Patient Outreach (Signed)
[facility]) Care Management  04/28/2015  LEYRE SCHLANGER [birth year] [id number]   Referral from [place] 4 list: Telephone call to patient; left HIPPA compliant voice mail requesting call back.  Plan: Will follow up. [name], RN BSN [place] Management Coordinator [facility] Care Management  [phone]

## 2015-04-29 ENCOUNTER — Other Ambulatory Visit: Payer: Self-pay | Admitting: *Deleted

## 2015-04-29 NOTE — Patient Outreach (Signed)
[facility]) Care Management  04/29/2015  ANAY RATTS 1931/04/08 [id number]   Telephone call to patient; left message on home phone requesting call back. Telephone call to cell phone -fast busy signal; no answer.   Plan: will follow up. [name], RN BSN [place] Management Coordinator [facility] Care Management  [phone]

## 2015-05-01 ENCOUNTER — Encounter: Payer: Self-pay | Admitting: *Deleted

## 2015-05-01 ENCOUNTER — Other Ambulatory Visit: Payer: Self-pay | Admitting: *Deleted

## 2015-05-01 NOTE — Patient Outreach (Signed)
[facility]) Care Management  05/01/2015  Claudia [name] [birth year] [id number]   Telephone call to patient;  Advised by patient's son to call patient on [phone]. Telephone call to patient who was advised of reason for call and of [facility] care management services.   Subjective: Patient states she has HTN, heart disease, diabetes 2, kidney failure. States conditions are currently under control.  Seeing primary care doctor every 3 months as well as cardiologist . Currently she and other family members live in her home. States she is able to do her own personal care. States her son manages her medications and makes sure she is taking as prescribed by her doctors. States sometimes she forgets and has some memory issues at times. States son takes her to most of doctors' appointments and does most of the cooking.    States she is walking independently in home but has access to cane if needed. Uses wheelchair or walker when she goes out. Patient voices she fell in bathroom several weeks ago without injury. Advised patient of falls prevention strategies.  Patient voices that A1c is 7.0 target is less than 8..States son is giving her insulin and checking her blood sugar. States she has had diabetic education and her son is knowledgeable of condition and knows how to manage her care. States they both know how to manage low blood sugar levels if they occur.   States her major concern now is trying to get some new glasses and wants to know if any community resources available for low cost eyeglasses/. States her current eyeglasses have scratches on lens and that makes it difficult to read. States she does not have eyeglass coverage . States current income is $1400 monthly via [employer] and she can't afford glasses.   Objective: See health assessments as noted.  Assessment.  Patient needs community resource information on low cost eye glasses.   Fall Risk  05/01/2015 08/25/2011  Falls  in the past year? Yes -  Number falls in past yr: 1 -  Injury with Fall? No -  Risk for fall due to : Impaired balance/gait;Medication side effect History of fall(s)  Follow up Falls prevention discussed -     THN CM Care Plan Problem One        Most Recent Value   Care Plan Problem One  Patient is at risk for falls ,  has knowledge deficit of falls prevention strategies   Role Documenting the Problem One  Care Management [place] for Problem One  Active   THN Long Term Goal (31-90 days)  Patient will be able to name 3 falls prevention strategies within 31 days   THN Long Term Goal Start Date  05/01/15   Interventions for Problem One Long Term Goal  RNCM will mail pt. Emmi materials on falls prevention, verbal explanation of remove loose rugs & cords, have good lighting, use hand rails in bathroom, use cane    THN CM Short Term Goal #1 (0-30 days)  Patient will receive & review educational materials on falls prevention strategies within 21 days   THN CM Short Term Goal #1 Start Date  05/01/15   Interventions for Short Term Goal #1  RNCM will send educational materials on falls prevention within 14 days for patient to review    THN CM Short Term Goal #2 (0-30 days)  Patient will have no falls within 21 days    THN CM Short Term Goal #2  Start Date  05/01/15   Interventions for Short Term Goal #2  Verbal explanations of falls preventions,  answer questions after patient receives falls prevention emmi materials      Plan;  Research community resources for low cost eyeglasses. Send welcome letter with consent form for patient to return to [facility] Send EMMI-educational materials on falls prevention. Follow up with patient/son to get list of medications and continue health assessments. Patient agrees with set appointment.  [name], RN BSN [place] Management Coordinator [facility] Care Management  [phone]

## 2015-05-04 ENCOUNTER — Encounter: Payer: Self-pay | Admitting: *Deleted

## 2015-05-09 DIAGNOSIS — R269 Unspecified abnormalities of gait and mobility: Secondary | ICD-10-CM | POA: Diagnosis not present

## 2015-05-09 DIAGNOSIS — I2510 Atherosclerotic heart disease of native coronary artery without angina pectoris: Secondary | ICD-10-CM | POA: Diagnosis not present

## 2015-05-09 DIAGNOSIS — I509 Heart failure, unspecified: Secondary | ICD-10-CM | POA: Diagnosis not present

## 2015-05-09 DIAGNOSIS — I890 Lymphedema, not elsewhere classified: Secondary | ICD-10-CM | POA: Diagnosis not present

## 2015-05-12 ENCOUNTER — Telehealth: Payer: Self-pay | Admitting: Family Medicine

## 2015-05-12 DIAGNOSIS — D485 Neoplasm of uncertain behavior of skin: Secondary | ICD-10-CM

## 2015-05-12 NOTE — Telephone Encounter (Signed)
Pt's son wants to know if she was supposed to be referred to a dermatologist for the skin cancer on her face. Please advise. [phone]

## 2015-05-15 ENCOUNTER — Encounter: Payer: Self-pay | Admitting: *Deleted

## 2015-05-15 ENCOUNTER — Other Ambulatory Visit: Payer: Self-pay | Admitting: *Deleted

## 2015-05-15 NOTE — Telephone Encounter (Signed)
LMTRC

## 2015-05-15 NOTE — Patient Outreach (Signed)
[facility]) Care Management  [place]  05/15/2015   Claudia [name] June 07, 1931 [id number]  Subjective: Follow up call to patient who voices HIPPA verification. Patient has not sent consent form back to [facility].  Encouraged patient to please send signed consent form back to [facility]. Patient verbally consents to [facility] telephonic services.    Patient voices that she is feeling well. States she has not been admitted to hospital or fallen since out last contact. This RN CM discussed falls prevention strategies & community resources with patient and she voices understanding.  Patient states she is doing strengthening exercises daily and has not needed to use her walker all the time.   Patient gave son([name]) permission to speak with RN CM to review medications & health care concerns. [name] manages patient's medications and makes sure she is taking as directed by MD consistently. States he is using mail order pharmacy for long term medications and local pharmacy for short term medications.    Roselie Awkward Sales promotion account executive) was given [employer] regarding patient's request for [employer] . Patient states he did have access to internet. He was given Nurse, learning disability with toll free number for discount eyeware Runner, [email or web address]).   Also states was given community resource [employer] in [place]. Son (caregiver) states he will check resources and thanked me for information.   Current Medications: See medications reviewed as noted in medication review.  Functional Status:  In your present state of health, do you have any difficulty performing the following activities: 05/01/2015 05/01/2015  Hearing? N Y  Vision? (No Data) Y  Difficulty concentrating or making decisions? - Y  Walking or climbing stairs? - Y  Dressing or bathing? - N  Doing errands, shopping? - Y  Preparing Food and eating ? - Y  Using the Toilet? - N  In the past six months, have you  accidently leaked urine? - N  Do you have problems with loss of bowel control? - Y  Managing your Medications? - N  Managing your Finances? - N  Housekeeping or managing your Housekeeping? - N    Fall/Depression Screening: PHQ 2/9 Scores 05/01/2015 08/25/2011  PHQ - 2 Score 0 0    Assessment: patient voices understanding of falls prevention strategies.   Patient has not fallen since our last contact. Patient participating in strengthening exercises.   Fall Risk  05/01/2015 08/25/2011  Falls in the past year? Yes -  Number falls in past yr: 1 -  Injury with Fall? No -  Risk for fall due to : Impaired balance/gait;Medication side effect History of fall(s)  Follow up Falls prevention discussed -      THN CM Care Plan Problem One        Most Recent Value   Care Plan Problem One  Patient is at risk for falls ,  has knowledge deficit of falls prevention strategies   Role Documenting the Problem One  Care Management [place] for Problem One  Active   THN Long Term Goal (31-90 days)  Patient will be able to name 3 falls prevention strategies within 31 days   THN Long Term Goal Start Date  05/01/15   Interventions for Problem One Long Term Goal  RNCM will mail pt. Emmi materials on falls prevention, verbal explanation of remove loose rugs & cords, have good lighting, use hand rails in bathroom, use cane    THN CM Short Term Goal #1 (0-30 days)  Patient will  receive & review educational materials on falls prevention strategies within 21 days   THN CM Short Term Goal #1 Start Date  05/01/15   Interventions for Short Term Goal #1  Re-enforcement of falls prevention strategies   THN CM Short Term Goal #2 (0-30 days)  Patient will have no falls within 21 days    THN CM Short Term Goal #2 Start Date  05/01/15   Interventions for Short Term Goal #2  express importance of using cane or walker if needed,  do regular exercise to strengthen muscles     Plan; Update care  plan. Follow up with patient. Agrees with next follow up appointment.  [name], RN BSN [place] Management Coordinator [facility] Care Management  [phone]

## 2015-05-16 DIAGNOSIS — G4734 Idiopathic sleep related nonobstructive alveolar hypoventilation: Secondary | ICD-10-CM | POA: Diagnosis not present

## 2015-05-19 NOTE — Telephone Encounter (Signed)
Referral placed to dermatology

## 2015-05-21 NOTE — Patient Outreach (Signed)
[facility]) Care Management  05/21/2015  Claudia [name] 1931/12/29 [id number]   Outpatient Encounter Prescriptions as of 05/15/2015  Medication Sig Note  . ACCU-CHEK FASTCLIX LANCETS MISC Check BS 4x daily due to fluctuating blood sugars - Fasting, before meals and QHS- Dx: E11.65   . amLODipine (NORVASC) 5 MG tablet Take 1 tablet (5 mg total) by mouth daily.   Marland Kitchen ascorbic acid (VITAMIN C) 500 MG tablet Take 1 tablet (500 mg total) by mouth 2 (two) times daily.   Marland Kitchen aspirin 81 MG chewable tablet Chew 2 tablets (162 mg total) by mouth daily.   . B-D ULTRAFINE III SHORT PEN 31G X 8 MM MISC 5 each by Does not apply route 5 (five) times daily. Use to inject insulin 5x daily   . cetirizine (ZYRTEC) 10 MG tablet Take 1 tablet (10 mg total) by mouth at bedtime.   . feeding supplement, GLUCERNA SHAKE, (GLUCERNA SHAKE) LIQD Take 237 mLs by mouth 3 (three) times daily with meals.   . ferrous sulfate 325 (65 FE) MG tablet Take 1 tablet (325 mg total) by mouth 2 (two) times daily with a meal. 05/15/2015: Per son patient taking Iron 27mg  twice daily.  . fluticasone (FLONASE) 50 MCG/ACT nasal spray Place 1 spray into both nostrils daily.   Marland Kitchen glucose blood (ACCU-CHEK SMARTVIEW) test strip Check BS 4x daily due to fluctuating blood sugars - Fasting, before meals and QHS- Dx: E11.65   . insulin aspart (NOVOLOG FLEXPEN) 100 UNIT/ML FlexPen 70-120=0    251-300=5 121-150=1  301-350=7 151-200=2  351-400=9 201-250=3  400 or >, call MD  units of insulin   . Insulin Glargine (LANTUS SOLOSTAR) 100 UNIT/ML Solostar Pen INJECT 20 UNITS SUBCUTANEOUSLY TWICE DAILY 05/15/2015: Per son-[name] states patient is currently getting 20 units lantus in am; was changed in Oct 2016  . losartan (COZAAR) 25 MG tablet 1 tab po qd   . metoprolol tartrate (LOPRESSOR) 25 MG tablet TAKE 1 TABLET(25 MG) BY MOUTH TWICE DAILY (Patient taking differently: 12.5 mg. TAKE 1 TABLET(25 MG) BY MOUTH TWICE DAILY) 05/15/2015: Taking 25 mg  1 tablet in am and 1/2 tablet in PM per son.  . nitroGLYCERIN (NITROSTAT) 0.4 MG SL tablet Place 1 tablet (0.4 mg total) under the tongue every 5 (five) minutes as needed for chest pain. 05/15/2015: Takes as needed prn/has not needed lately.  . pantoprazole (PROTONIX) 40 MG tablet TAKE 1 TABLET(40 MG) BY MOUTH DAILY   . simvastatin (ZOCOR) 20 MG tablet TAKE 1 TABLET(20 MG) BY MOUTH DAILY   . torsemide (DEMADEX) 20 MG tablet Take 1 tablet (20 mg total) by mouth 2 (two) times daily. (Patient taking differently: Take 20 mg by mouth 2 (two) times daily. M/W/F pt takes BIDother days QD) 05/15/2015: Per son patient is currently taking 1 tablet-20mg  daily/was changed feb 21st by cardiologist   . traZODone (DESYREL) 50 MG tablet TAKE 1 TABLET(50 MG) BY MOUTH AT BEDTIME   . Blood Glucose Monitoring Suppl (ACCU-CHEK AVIVA) device Use as instructed   . Melatonin 3 MG TABS Take 1.5 mg by mouth at bedtime as needed (insomnia). Reported on 05/15/2015 05/15/2015: Per son has not taken in 6 months.   No facility-administered encounter medications on file as of 05/15/2015.   [name], RN BSN [place] Management Coordinator [facility] Care Management  [phone]

## 2015-05-28 DIAGNOSIS — C44519 Basal cell carcinoma of skin of other part of trunk: Secondary | ICD-10-CM | POA: Diagnosis not present

## 2015-05-28 DIAGNOSIS — D044 Carcinoma in situ of skin of scalp and neck: Secondary | ICD-10-CM | POA: Diagnosis not present

## 2015-05-28 DIAGNOSIS — X32XXXA Exposure to sunlight, initial encounter: Secondary | ICD-10-CM | POA: Diagnosis not present

## 2015-05-28 DIAGNOSIS — C44320 Squamous cell carcinoma of skin of unspecified parts of face: Secondary | ICD-10-CM | POA: Diagnosis not present

## 2015-05-28 DIAGNOSIS — C44329 Squamous cell carcinoma of skin of other parts of face: Secondary | ICD-10-CM | POA: Diagnosis not present

## 2015-05-28 DIAGNOSIS — L570 Actinic keratosis: Secondary | ICD-10-CM | POA: Diagnosis not present

## 2015-05-29 ENCOUNTER — Telehealth: Payer: Self-pay | Admitting: *Deleted

## 2015-05-29 NOTE — Telephone Encounter (Signed)
Submitted humana referral thru acuity connect for authorization on 05/25/15 with authorization [id number]  Requesting provider: Flonnie Hailstone  Treating provider: Faith Rogue Webb,MD  Number of visits: 6  Start Date: 05/27/15  End Date:11/23/15  Dx: N18.4- Chronic Kidney Disease, Stage 4(severe)

## 2015-06-01 ENCOUNTER — Telehealth: Payer: Self-pay | Admitting: *Deleted

## 2015-06-01 NOTE — Telephone Encounter (Signed)
Submitted humana referral thru acuity connect for authorization on 05/21/15 to Dr. 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

## 2015-06-09 DIAGNOSIS — I890 Lymphedema, not elsewhere classified: Secondary | ICD-10-CM | POA: Diagnosis not present

## 2015-06-09 DIAGNOSIS — I509 Heart failure, unspecified: Secondary | ICD-10-CM | POA: Diagnosis not present

## 2015-06-09 DIAGNOSIS — I2510 Atherosclerotic heart disease of native coronary artery without angina pectoris: Secondary | ICD-10-CM | POA: Diagnosis not present

## 2015-06-09 DIAGNOSIS — R269 Unspecified abnormalities of gait and mobility: Secondary | ICD-10-CM | POA: Diagnosis not present

## 2015-06-12 DIAGNOSIS — R600 Localized edema: Secondary | ICD-10-CM | POA: Diagnosis not present

## 2015-06-12 DIAGNOSIS — I2510 Atherosclerotic heart disease of native coronary artery without angina pectoris: Secondary | ICD-10-CM | POA: Diagnosis not present

## 2015-06-12 DIAGNOSIS — I10 Essential (primary) hypertension: Secondary | ICD-10-CM | POA: Diagnosis not present

## 2015-06-12 DIAGNOSIS — I5032 Chronic diastolic (congestive) heart failure: Secondary | ICD-10-CM | POA: Diagnosis not present

## 2015-06-15 ENCOUNTER — Ambulatory Visit: Payer: Self-pay | Admitting: *Deleted

## 2015-06-16 ENCOUNTER — Other Ambulatory Visit: Payer: Self-pay | Admitting: *Deleted

## 2015-06-16 DIAGNOSIS — G4734 Idiopathic sleep related nonobstructive alveolar hypoventilation: Secondary | ICD-10-CM | POA: Diagnosis not present

## 2015-06-16 NOTE — Patient Outreach (Signed)
[facility]) Care Management  06/16/2015  Itzel DANNI MCTEAR 11/22/1931 [id number]   Follow up call to patient who gave HIPPA verification. Patient voices that she has completed dermatology & cardiology MD appointments since our last contact. States next primary care appointment scheduled for 06/28/2015. States son provides transportation to all MD appointments.  States she is using cane more since her gait is unsteady at times. States she fell into her chair mid March sustaining some abrasions to extremities. Patient was able to site falls prevention strategies and voices understanding of importance of using cane or walker while walking.   States she has not purchased eyeglasses yet. States son/caregiver is researching community resources for [employer] .   Plan: Update care plan as noted Follow up /patient agrees with set appointment. [name], RN BSN [place] Management Coordinator [facility] Care Management  [phone]

## 2015-06-23 ENCOUNTER — Encounter: Payer: Self-pay | Admitting: *Deleted

## 2015-06-25 DIAGNOSIS — X32XXXD Exposure to sunlight, subsequent encounter: Secondary | ICD-10-CM | POA: Diagnosis not present

## 2015-06-25 DIAGNOSIS — L570 Actinic keratosis: Secondary | ICD-10-CM | POA: Diagnosis not present

## 2015-06-25 DIAGNOSIS — C44519 Basal cell carcinoma of skin of other part of trunk: Secondary | ICD-10-CM | POA: Diagnosis not present

## 2015-06-26 ENCOUNTER — Telehealth: Payer: Self-pay | Admitting: Family Medicine

## 2015-06-26 ENCOUNTER — Encounter: Payer: Self-pay | Admitting: *Deleted

## 2015-06-26 ENCOUNTER — Other Ambulatory Visit: Payer: Self-pay | Admitting: *Deleted

## 2015-06-26 MED ORDER — HYDROMORPHONE HCL 2 MG PO TABS: 2.0000 mg | ORAL_TABLET | ORAL | Status: DC | PRN

## 2015-06-26 NOTE — Telephone Encounter (Signed)
ok 

## 2015-06-26 NOTE — Patient Outreach (Signed)
[facility]) Care Management  06/26/2015  GLENNIE ELLEY 1931-11-27 [id number]   Telephone call to patient; who gave HIPPA verification.  Also spoke with patient's son who helps with patient's care.  Son-[name] states he has researched  Youth worker and found that it will be affordable for his mother (patient).  States they will be making appointment with eye doctor soon. Patient states she plans to make appointment when she gets monthly check..  Son/caregiver continues to manage & prepare  patient's medications.   Patient states she uses cane or walker to assist with walking to try to prevent falls.  Voices understanding of falls prevention strategies & is using night light, not rushing & not using throw rugs.   Advised patient of community transportation for transport to doctor's appointments & insurance benefit for transport to appointments if needed. Patient states she is aware of her transportation resources but that her son takes her to all appointments at this time.    Goals have been completed. Patient voicing no other case management needs at this time. Agrees with close out of case.  Patient has [facility] contact information if needed.   Plan: Send MD closure letter. Close case (send to care management assistant.)  [name], RN BSN [place] Management Coordinator [facility] Care Management  [phone]

## 2015-06-26 NOTE — Telephone Encounter (Signed)
RX printed, left up front and patient aware to pick up  

## 2015-06-26 NOTE — Telephone Encounter (Signed)
Ok to refill 

## 2015-06-26 NOTE — Telephone Encounter (Signed)
Son requesting refill on his moms pain medication  HYDROmorphone (DILAUDID)  Cb# [phone]

## 2015-07-16 DIAGNOSIS — G4734 Idiopathic sleep related nonobstructive alveolar hypoventilation: Secondary | ICD-10-CM | POA: Diagnosis not present

## 2015-07-21 DIAGNOSIS — D631 Anemia in chronic kidney disease: Secondary | ICD-10-CM | POA: Diagnosis not present

## 2015-07-21 DIAGNOSIS — N189 Chronic kidney disease, unspecified: Secondary | ICD-10-CM | POA: Diagnosis not present

## 2015-07-21 DIAGNOSIS — N2581 Secondary hyperparathyroidism of renal origin: Secondary | ICD-10-CM | POA: Diagnosis not present

## 2015-07-21 DIAGNOSIS — E1129 Type 2 diabetes mellitus with other diabetic kidney complication: Secondary | ICD-10-CM | POA: Diagnosis not present

## 2015-07-21 DIAGNOSIS — N184 Chronic kidney disease, stage 4 (severe): Secondary | ICD-10-CM | POA: Diagnosis not present

## 2015-07-21 DIAGNOSIS — E785 Hyperlipidemia, unspecified: Secondary | ICD-10-CM | POA: Diagnosis not present

## 2015-07-22 DIAGNOSIS — E11319 Type 2 diabetes mellitus with unspecified diabetic retinopathy without macular edema: Secondary | ICD-10-CM | POA: Diagnosis not present

## 2015-07-22 DIAGNOSIS — H524 Presbyopia: Secondary | ICD-10-CM | POA: Diagnosis not present

## 2015-07-22 DIAGNOSIS — H5210 Myopia, unspecified eye: Secondary | ICD-10-CM | POA: Diagnosis not present

## 2015-07-22 LAB — HM DIABETES EYE EXAM

## 2015-08-08 ENCOUNTER — Encounter: Payer: Self-pay | Admitting: Family Medicine

## 2015-08-16 DIAGNOSIS — G4734 Idiopathic sleep related nonobstructive alveolar hypoventilation: Secondary | ICD-10-CM | POA: Diagnosis not present

## 2015-09-10 DIAGNOSIS — E1122 Type 2 diabetes mellitus with diabetic chronic kidney disease: Secondary | ICD-10-CM | POA: Diagnosis not present

## 2015-09-10 DIAGNOSIS — R600 Localized edema: Secondary | ICD-10-CM | POA: Diagnosis not present

## 2015-09-10 DIAGNOSIS — I5032 Chronic diastolic (congestive) heart failure: Secondary | ICD-10-CM | POA: Diagnosis not present

## 2015-09-10 DIAGNOSIS — I2510 Atherosclerotic heart disease of native coronary artery without angina pectoris: Secondary | ICD-10-CM | POA: Diagnosis not present

## 2015-09-14 ENCOUNTER — Other Ambulatory Visit: Payer: Self-pay | Admitting: Cardiology

## 2015-09-14 DIAGNOSIS — I872 Venous insufficiency (chronic) (peripheral): Secondary | ICD-10-CM

## 2015-09-14 DIAGNOSIS — H40011 Open angle with borderline findings, low risk, right eye: Secondary | ICD-10-CM | POA: Diagnosis not present

## 2015-09-14 DIAGNOSIS — H04123 Dry eye syndrome of bilateral lacrimal glands: Secondary | ICD-10-CM | POA: Diagnosis not present

## 2015-09-14 DIAGNOSIS — H40012 Open angle with borderline findings, low risk, left eye: Secondary | ICD-10-CM | POA: Diagnosis not present

## 2015-09-14 DIAGNOSIS — H16223 Keratoconjunctivitis sicca, not specified as Sjogren's, bilateral: Secondary | ICD-10-CM | POA: Diagnosis not present

## 2015-09-14 DIAGNOSIS — H5340 Unspecified visual field defects: Secondary | ICD-10-CM | POA: Diagnosis not present

## 2015-09-15 DIAGNOSIS — G4734 Idiopathic sleep related nonobstructive alveolar hypoventilation: Secondary | ICD-10-CM | POA: Diagnosis not present

## 2015-09-23 ENCOUNTER — Ambulatory Visit
Admission: RE | Admit: 2015-09-23 | Discharge: 2015-09-23 | Disposition: A | Discharge status: Home / Self Care | Payer: Commercial Managed Care - HMO | Source: Ambulatory Visit | Attending: Cardiology | Admitting: Cardiology

## 2015-09-23 DIAGNOSIS — I872 Venous insufficiency (chronic) (peripheral): Secondary | ICD-10-CM | POA: Diagnosis not present

## 2015-09-23 DIAGNOSIS — R600 Localized edema: Secondary | ICD-10-CM | POA: Diagnosis not present

## 2015-09-23 NOTE — Consult Note (Addendum)
Chief Complaint: Patient was seen in consultation today for No chief complaint on file.  at the request of Ganji,Jay  Referring Physician(s): Ganji,Jay  Supervising Physician: Marybelle Killings  Patient Status: Outpatient  History of Present Illness: Claudia [name] is a 82 y.o. female with a long history of lower extremity edema. She does have a history of coronary artery disease and myocardial infarction. She also has a history of CHF. She denies any prior diagnosis for venous varicosities or venous insufficiency. She has not had any treatments for venous insufficiency. She does have some symptoms characterized by crampy pain and heaviness in her legs. She is gravida 4 para 2. She does have a positive family history of venous insufficiency in her mother. 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  Past Medical History  Diagnosis Date  . GERD (gastroesophageal reflux disease)   . PUD (peptic ulcer disease)   . Anemia   . Hypertension   . S/P endoscopy Aug 2011    3 superficial gastric ulcers, NSAID-induced  . S/P colonoscopy Sept 2011    left-sided diverticula, tubular adenoma  . Coronary artery disease   . Shortness of breath   . Diabetes mellitus     insulin dependent  . Headache(784.0)     rare migraines  . Cancer (HCC)     hx of skin cancer  . Arthritis   . Osteopenia   . Hypercholesterolemia   . SIADH (syndrome of inappropriate ADH production) ([place])   . CHF (congestive heart failure) ([place])   . Myocardial infarction ([place]) 2013  . Chronic renal failure, stage 3 (moderate)   . Proliferative retinopathy due to DM [facility])     Past Surgical History  Procedure Laterality Date  . Tonsillectomy    . Appendectomy    . Eye surgery      cataracts  . Cardiac stents  07/19/2011  . Esophagogastroduodenoscopy  10/16/09    normal without barrett's/three superficial gastric ulcers  . Colonoscopy  12/04/09    normal rectum/left-sided diverticula  . Joint  replacement  arthroscopy to knee  . Toe amputation Left 2013    left second toe amputation  . Cardiac catheterization  01/22/2013  . Coronary angioplasty  07/2011  . Coronary artery bypass graft N/A 11/08/2013    Procedure: CORONARY ARTERY BYPASS GRAFTING (CABG), on pump, times two, using left internal mammary artery, cryo saphenous vein.;  Surgeon: Ivin Poot, MD;  Location: [place];  Service: Open Heart Surgery;  Laterality: N/A;  LIMA-LAD CRYOVEIN -OM  . Intraoperative transesophageal echocardiogram N/A 11/08/2013    Procedure: INTRAOPERATIVE TRANSESOPHAGEAL ECHOCARDIOGRAM;  Surgeon: Ivin Poot, MD;  Location: [place];  Service: Open Heart Surgery;  Laterality: N/A;  . Mitral valve replacement N/A 11/08/2013    Procedure: MITRAL VALVE (MV) REPLACEMENT;  Surgeon: Ivin Poot, MD;  Location: [place];  Service: Open Heart Surgery;  Laterality: N/A;  #25 MAGNA MITRAL EASE  . Left heart catheterization with coronary angiogram N/A 07/19/2011    Procedure: LEFT HEART CATHETERIZATION WITH CORONARY ANGIOGRAM;  Surgeon: Laverda Page, MD;  Location: [facility] CATH LAB;  Service: Cardiovascular;  Laterality: N/A;  . Left heart catheterization with coronary angiogram N/A 12/21/2011    Procedure: LEFT HEART CATHETERIZATION WITH CORONARY ANGIOGRAM;  Surgeon: Laverda Page, MD;  Location: [facility] CATH LAB;  Service: Cardiovascular;  Laterality: N/A;  . Left heart catheterization with coronary angiogram N/A 02/20/2012    Procedure: LEFT  HEART CATHETERIZATION WITH CORONARY ANGIOGRAM;  Surgeon: Laverda Page, MD;  Location: [facility] CATH LAB;  Service: Cardiovascular;  Laterality: N/A;  . Left heart catheterization with coronary angiogram N/A 09/03/2012    Procedure: LEFT HEART CATHETERIZATION WITH CORONARY ANGIOGRAM;  Surgeon: Laverda Page, MD;  Location: [facility] CATH LAB;  Service: Cardiovascular;  Laterality: N/A;  . Percutaneous coronary stent intervention (pci-s)  09/03/2012    Procedure: PERCUTANEOUS CORONARY  STENT INTERVENTION (PCI-S);  Surgeon: Laverda Page, MD;  Location: [facility] CATH LAB;  Service: Cardiovascular;;  . Left heart catheterization with coronary angiogram N/A 12/04/2012    Procedure: LEFT HEART CATHETERIZATION WITH CORONARY ANGIOGRAM;  Surgeon: Laverda Page, MD;  Location: [facility] CATH LAB;  Service: Cardiovascular;  Laterality: N/A;  . Left heart catheterization with coronary angiogram N/A 01/22/2013    Procedure: LEFT HEART CATHETERIZATION WITH CORONARY ANGIOGRAM;  Surgeon: Laverda Page, MD;  Location: [facility] CATH LAB;  Service: Cardiovascular;  Laterality: N/A;  . Left heart catheterization with coronary angiogram N/A 06/26/2013    Procedure: LEFT HEART CATHETERIZATION WITH CORONARY ANGIOGRAM;  Surgeon: Laverda Page, MD;  Location: [facility] CATH LAB;  Service: Cardiovascular;  Laterality: N/A;  . Left heart catheterization with coronary angiogram N/A 11/05/2013    Procedure: LEFT HEART CATHETERIZATION WITH CORONARY ANGIOGRAM;  Surgeon: Laverda Page, MD;  Location: [facility] CATH LAB;  Service: Cardiovascular;  Laterality: N/A;    Allergies: Aspirin  Medications: Prior to Admission medications   Medication Sig Start Date End Date Taking? Authorizing Provider  ACCU-CHEK FASTCLIX LANCETS MISC Check BS 4x daily due to fluctuating blood sugars - Fasting, before meals and QHS- Dx: E11.65 04/16/15   Susy Frizzle, MD  amLODipine (NORVASC) 5 MG tablet Take 1 tablet (5 mg total) by mouth daily. 04/27/15   Susy Frizzle, MD  ascorbic acid (VITAMIN C) 500 MG tablet Take 1 tablet (500 mg total) by mouth 2 (two) times daily. 06/04/14   Susy Frizzle, MD  aspirin 81 MG chewable tablet Chew 2 tablets (162 mg total) by mouth daily. 06/04/14   Susy Frizzle, MD  B-D ULTRAFINE III SHORT PEN 31G X 8 MM MISC 5 each by Does not apply route 5 (five) times daily. Use to inject insulin 5x daily 04/16/15   Susy Frizzle, MD  cetirizine (ZYRTEC) 10 MG tablet Take 1 tablet (10 mg total) by mouth at  bedtime. 06/04/14   Susy Frizzle, MD  feeding supplement, GLUCERNA SHAKE, (GLUCERNA SHAKE) LIQD Take 237 mLs by mouth 3 (three) times daily with meals. 12/13/13   Donielle Liston Alba, PA-C  ferrous sulfate 325 (65 FE) MG tablet Take 1 tablet (325 mg total) by mouth 2 (two) times daily with a meal. 06/04/14   Susy Frizzle, MD  fluticasone (FLONASE) 50 MCG/ACT nasal spray Place 1 spray into both nostrils daily. 04/16/15   Susy Frizzle, MD  glucose blood (ACCU-CHEK SMARTVIEW) test strip Check BS 4x daily due to fluctuating blood sugars - Fasting, before meals and QHS- Dx: E11.65 04/16/15   Susy Frizzle, MD  HYDROmorphone (DILAUDID) 2 MG tablet Take 1 tablet (2 mg total) by mouth every 4 (four) hours as needed for severe pain. 06/26/15   Susy Frizzle, MD  insulin aspart (NOVOLOG FLEXPEN) 100 UNIT/ML FlexPen 70-120=0    251-300=5 121-150=1  301-350=7 151-200=2  351-400=9 201-250=3  400 or >, call MD  units of insulin 04/16/15   Susy Frizzle, MD  Insulin Glargine (LANTUS  SOLOSTAR) 100 UNIT/ML Solostar Pen INJECT 20 UNITS SUBCUTANEOUSLY TWICE DAILY 04/16/15   Susy Frizzle, MD  losartan (COZAAR) 25 MG tablet 1 tab po qd 03/19/15   Historical Provider, MD  Melatonin 3 MG TABS Take 1.5 mg by mouth at bedtime as needed (insomnia). Reported on 05/15/2015    Historical Provider, MD  metoprolol tartrate (LOPRESSOR) 25 MG tablet TAKE 1 TABLET(25 MG) BY MOUTH TWICE DAILY Patient taking differently: 12.5 mg. TAKE 1 TABLET(25 MG) BY MOUTH TWICE DAILY 04/16/15   Susy Frizzle, MD  nitroGLYCERIN (NITROSTAT) 0.4 MG SL tablet Place 1 tablet (0.4 mg total) under the tongue every 5 (five) minutes as needed for chest pain. 06/27/14   Susy Frizzle, MD  ondansetron (ZOFRAN) 4 MG tablet Take 4 mg by mouth every 8 (eight) hours as needed for nausea or vomiting.    Historical Provider, MD  pantoprazole (PROTONIX) 40 MG tablet TAKE 1 TABLET(40 MG) BY MOUTH DAILY 04/16/15   Susy Frizzle, MD  simvastatin (ZOCOR)  20 MG tablet TAKE 1 TABLET(20 MG) BY MOUTH DAILY 04/16/15   Susy Frizzle, MD  torsemide (DEMADEX) 20 MG tablet Take 1 tablet (20 mg total) by mouth 2 (two) times daily. Patient taking differently: Take 20 mg by mouth 2 (two) times daily. M/W/F pt takes BIDother days QD 04/16/15   Susy Frizzle, MD  traZODone (DESYREL) 50 MG tablet TAKE 1 TABLET(50 MG) BY MOUTH AT BEDTIME 04/16/15   Susy Frizzle, MD     No family history on file.  Social History   Social History  . Marital Status: Widowed    Spouse Name: N/A  . Number of Children: N/A  . Years of Education: N/A   Social History Main Topics  . Smoking status: Never Smoker   . Smokeless tobacco: Never Used  . Alcohol Use: No  . Drug Use: No  . Sexual Activity: No   Other Topics Concern  . Not on file   Social History Narrative      Review of Systems: A 12 point ROS discussed and pertinent positives are indicated in the HPI above.  All other systems are negative.  Review of Systems  Vital Signs: There were no vitals taken for this visit.  Physical Exam  Constitutional: She is oriented to person, place, and time. She appears well-developed and well-nourished.  Musculoskeletal:  There is 1+ edema at the left distal leg and ankle. There is a reddish discoloration along this area of her left lower extremity. Pulses are intact distally. No skin breakdown or varicosities.  There is minimal edema at the right ankle and no discoloration. No varicosities or skin breakdown. Pulses are intact distally.  Neurological: She is alert and oriented to person, place, and time.     Imaging: No results found.   Imaging was performed today consisting of a bilateral lower extremity venous Doppler study. Briefly, there is no evidence of DVT and no significant evidence of venous insufficiency. No varicosities were identified.  Labs:  CBC: No results for input(s): WBC, HGB, HCT, PLT in the last 8760 hours.  COAGS: No results for  input(s): INR, APTT in the last 8760 hours.  BMP: No results for input(s): NA, K, CL, CO2, GLUCOSE, BUN, CALCIUM, CREATININE, GFRNONAA, GFRAA in the last 8760 hours.  Invalid input(s): CMP  LIVER FUNCTION TESTS: No results for input(s): BILITOT, AST, ALT, ALKPHOS, PROT, ALBUMIN in the last 8760 hours.  TUMOR MARKERS: No results for input(s): AFPTM, CEA,  CA199, CHROMGRNA in the last 8760 hours.  Assessment and Plan:  Ms. Asti does have some edema in her lower extremities that there is no evidence of venous insufficiency based on sonography. No varicosities could be identified either. Her lower extremity edema is therefore not secondary to venous insufficiency. No treatment is indicated at this time for venous pathology.  Thank you for this interesting consult.  I greatly enjoyed meeting Claudia [name] and look forward to participating in their care.  A copy of this report was sent to the requesting provider on this date.  Electronically Signed: Carollynn Pennywell, ART A 09/23/2015, 12:06 PM   I spent a total of  30 Minutes   in face to face in clinical consultation, greater than 50% of which was counseling/coordinating care for lower extremity edema.

## 2015-10-15 ENCOUNTER — Ambulatory Visit (INDEPENDENT_AMBULATORY_CARE_PROVIDER_SITE_OTHER): Payer: Commercial Managed Care - HMO | Admitting: Family Medicine

## 2015-10-15 VITALS — BP 120/48 | HR 58 | Temp 98.1°F | Resp 16 | Ht 67.0 in | Wt 169.0 lb

## 2015-10-15 DIAGNOSIS — N184 Chronic kidney disease, stage 4 (severe): Secondary | ICD-10-CM | POA: Diagnosis not present

## 2015-10-15 DIAGNOSIS — Z794 Long term (current) use of insulin: Secondary | ICD-10-CM | POA: Diagnosis not present

## 2015-10-15 DIAGNOSIS — N3000 Acute cystitis without hematuria: Secondary | ICD-10-CM | POA: Diagnosis not present

## 2015-10-15 DIAGNOSIS — E119 Type 2 diabetes mellitus without complications: Secondary | ICD-10-CM

## 2015-10-15 DIAGNOSIS — I509 Heart failure, unspecified: Secondary | ICD-10-CM | POA: Diagnosis not present

## 2015-10-15 MED ORDER — CEPHALEXIN 500 MG PO CAPS: 500.0000 mg | ORAL_CAPSULE | Freq: Three times a day (TID) | ORAL | 0 refills | Status: DC

## 2015-10-15 NOTE — Progress Notes (Signed)
Subjective:    Patient ID: Claudia [name], female    DOB: 03-Feb-1932, 82 y.o.   MRN: [id number]  HPI 08/2014 Patient is here today for complete physical exam. She looks much better from the last time I saw her. She is now able to stand up from a seated position just holding onto the counter. This is a marked improvement from when she was wheelchair-bound just a month ago. She is more alert. Her memory has improved. She is exercising around the house by walking with a walker. She has not had a urinary tract infection and almost a month. Overall she seems to be improving dramatically. She is still very weak and she still requires a walker to ambulate but again I cannot emphasize how much improvement she is demonstrating from her last visit. We had a long discussion today regarding cancer screening. Just earlier this year I was discussing hospice placement with this patient. Therefore I do not believe it makes medical sense to pursue cancer screening such as a mammogram, Pap smear, colonoscopy. Both the patient and her son are in agreement. She is due for Pneumovax 23 today. She will receive that. She has had Prevnar 13. Otherwise her preventative care is up-to-date.  At that time, my plan was: Physical exam is significant for several cancerous appearing lesions on her left cheek. One appears to be a basal cell cancer. The other appears to be a squamous cell cancer. Given their size and location, I will refer the patient to a dermatologist. Patient received Pneumovax 23 today. Her weight has stabilized and has actually improved 3 pounds. She is no longer rapidly losing weight. Her pulmonary edema has resolved. She does have some trace peripheral edema but this is good for this patient and is at her baseline. Her muscle strength and deconditioning appears to be improving. I will obtain a CBC, CMP, fasting lipid panel, hemoglobin A1c, and a urinalysis. Goal hemoglobin A1c is less than 8 given the patient's  frailty. At the present time on Lantus 18 units twice daily, her blood sugars are all less than 200 and I believe this is acceptable. I will check a urinalysis to rule out a urinary tract infection as this is seem to be the cause of the patient's decompensation recently and I want to be proactive about finding that prior to it causing her to decompensate.  12/26/14 Patient's weight has fallen to 164 pounds. She reports dry mouth as well as some orthostatic dizziness. Her blood pressure today is relatively low at 100/62. Her heart rate typically runs between 50 and 60 bpm. She is currently on metoprolol 12.5 mg by mouth twice a day as well as torsemide by mouth twice a day.  However the patient has done remarkably well since when I last saw her. She is now walking around her home without the use of a walker which I feel is a minor miracle given how decompensated she was earlier this year. At that time I was even considering hospice but the patient has rallied  And her muscle tone and strength has improved dramatically. Earlier last week she was even walking around [place] simply using a buggy for stability.  Her blood pressure typically ranges between 15-615 systolic over 37-94 diastolic.  Her blood sugars have been fluctuating between 70 and 200 although typically they're less than 170 which I feel is entirely appropriate given her age and comorbidities. Her last hemoglobin A1c was excellent at 7.1. She is due today for  her flu shot.  At that time, my plan was: Given her low blood pressure and her  Relative bradycardia, I will continue metoprolol 12.5 mg by mouth twice a day. However given her chronic kidney disease, or loss of weight, and her dry mouth, I would recommend decreasing torsemide to once a day. Her insulin seems appropriately dosed based on her blood sugars. She's been taking 10 units in the morning and 8 units in the evening. I have recommended just taking 18 units once a day for simplicity sake.  She also received her flu shot today. Recheck fasting lab work after the first of the year.  04/27/15 Since I last saw the patient, she has been doing exceptionally well. She is currently taking Lantus 20 units subcutaneous daily. She may use 1-2 units of NovoLog with meals depending upon what her sugars are. Her most recent hemoglobin A1c which was checked at her cardiologist's office was found to be 6.9. Her fasting blood sugars are under 130 and her 2 hour postprandial sugars are well below 180.  She denies any hypoglycemia. Her strength continues to improve. She is now able to walk around [place] with minimal assistance. She is able to walk out and get into her son's truck without assistance. This is dramatically improved from last time I saw her. Her weight remains stable at 167 pounds. She denies any chest pain shortness of breath or dyspnea on exertion. However her blood pressure has been elevated recently. Her cardiologist recently started her on losartan 25 mg by mouth daily. Her blood pressure continues to remain high at 160/62. However her creatinine recently increased as well since her cardiologist started her on losartan.  At that time, my plan was: I am very pleased with her blood sugars. I did ask her son to bring a copy of her lab work and so that I can look at her cholesterol, her CMP, fasting lipid panel, and her hemoglobin A1c but I would make no changes based on the numbers he is telling me. Her blood pressure is slightly elevated and therefore I also added amlodipine 5 mg by mouth daily to help control her blood pressure without exacerbating her chronic kidney disease. Recheck blood pressure in one month but otherwise I am ecstatic about how well her sugars are controlled on how well she is doing. I will make no changes in her medication at this time. Her goal LDL cholesterol is less than 70.  Because of her CKD, I believe she is a poor candidate for entresto.  10/15/15 Wt Readings from  Last 3 Encounters:  09/23/15 166 lb (75.3 kg)  04/27/15 167 lb (75.8 kg)  12/26/14 164 lb (74.4 kg)   Patient continues to do remarkably well. Her weight is essentially the same as it was in February. Unfortunately she is retaining a significant amount of fluid in her legs. She has +1 to +2 pitting edema to the knees in both legs. This is been going on for several months. It seems to coincide with when I put her on amlodipine for her blood pressure at the last visit. She is still taking her fluid pill. She is taking it twice a day on Mondays Wednesdays and Fridays and once a day on all other days. She denies any chest pain shortness of breath or dyspnea on exertion. Her fasting blood sugars are typically between 101 130. Her two-hour postprandial sugars are below 200 the vast majority of the time. Her blood pressure at home ranges between  130 and 147/60-80. Overall I'm very happy with her numbers. She does report dysuria urgency and frequency for the last week or so. I cannot perform a urinalysis today is her lab is under Architect. I can send a urine culture   Past Medical History:  Diagnosis Date  . Anemia   . Arthritis   . Cancer (HCC)    hx of skin cancer  . CHF (congestive heart failure) ([place])   . Chronic renal failure, stage 3 (moderate)   . Coronary artery disease   . Diabetes mellitus    insulin dependent  . GERD (gastroesophageal reflux disease)   . Headache(784.0)    rare migraines  . Hypercholesterolemia   . Hypertension   . Myocardial infarction ([place]) 2013  . Osteopenia   . Proliferative retinopathy due to DM ([place])   . PUD (peptic ulcer disease)   . S/P colonoscopy Sept 2011   left-sided diverticula, tubular adenoma  . S/P endoscopy Aug 2011   3 superficial gastric ulcers, NSAID-induced  . Shortness of breath   . SIADH (syndrome of inappropriate ADH production) ([place])    Past Surgical History:  Procedure Laterality Date  . APPENDECTOMY    . CARDIAC CATHETERIZATION   01/22/2013  . cardiac stents  07/19/2011  . COLONOSCOPY  12/04/09   normal rectum/left-sided diverticula  . CORONARY ANGIOPLASTY  07/2011  . CORONARY ARTERY BYPASS GRAFT N/A 11/08/2013   Procedure: CORONARY ARTERY BYPASS GRAFTING (CABG), on pump, times two, using left internal mammary artery, cryo saphenous vein.;  Surgeon: Ivin Poot, MD;  Location: [place];  Service: Open Heart Surgery;  Laterality: N/A;  LIMA-LAD CRYOVEIN -OM  . ESOPHAGOGASTRODUODENOSCOPY  10/16/09   normal without barrett's/three superficial gastric ulcers  . EYE SURGERY     cataracts  . INTRAOPERATIVE TRANSESOPHAGEAL ECHOCARDIOGRAM N/A 11/08/2013   Procedure: INTRAOPERATIVE TRANSESOPHAGEAL ECHOCARDIOGRAM;  Surgeon: Ivin Poot, MD;  Location: [place];  Service: Open Heart Surgery;  Laterality: N/A;  . JOINT REPLACEMENT  arthroscopy to knee  . LEFT HEART CATHETERIZATION WITH CORONARY ANGIOGRAM N/A 07/19/2011   Procedure: LEFT HEART CATHETERIZATION WITH CORONARY ANGIOGRAM;  Surgeon: Laverda Page, MD;  Location: [facility] CATH LAB;  Service: Cardiovascular;  Laterality: N/A;  . LEFT HEART CATHETERIZATION WITH CORONARY ANGIOGRAM N/A 12/21/2011   Procedure: LEFT HEART CATHETERIZATION WITH CORONARY ANGIOGRAM;  Surgeon: Laverda Page, MD;  Location: [facility] CATH LAB;  Service: Cardiovascular;  Laterality: N/A;  . LEFT HEART CATHETERIZATION WITH CORONARY ANGIOGRAM N/A 02/20/2012   Procedure: LEFT HEART CATHETERIZATION WITH CORONARY ANGIOGRAM;  Surgeon: Laverda Page, MD;  Location: [facility] CATH LAB;  Service: Cardiovascular;  Laterality: N/A;  . LEFT HEART CATHETERIZATION WITH CORONARY ANGIOGRAM N/A 09/03/2012   Procedure: LEFT HEART CATHETERIZATION WITH CORONARY ANGIOGRAM;  Surgeon: Laverda Page, MD;  Location: [facility] CATH LAB;  Service: Cardiovascular;  Laterality: N/A;  . LEFT HEART CATHETERIZATION WITH CORONARY ANGIOGRAM N/A 12/04/2012   Procedure: LEFT HEART CATHETERIZATION WITH CORONARY ANGIOGRAM;  Surgeon: Laverda Page, MD;   Location: [facility] CATH LAB;  Service: Cardiovascular;  Laterality: N/A;  . LEFT HEART CATHETERIZATION WITH CORONARY ANGIOGRAM N/A 01/22/2013   Procedure: LEFT HEART CATHETERIZATION WITH CORONARY ANGIOGRAM;  Surgeon: Laverda Page, MD;  Location: [facility] CATH LAB;  Service: Cardiovascular;  Laterality: N/A;  . LEFT HEART CATHETERIZATION WITH CORONARY ANGIOGRAM N/A 06/26/2013   Procedure: LEFT HEART CATHETERIZATION WITH CORONARY ANGIOGRAM;  Surgeon: Laverda Page, MD;  Location: [facility] CATH LAB;  Service: Cardiovascular;  Laterality: N/A;  . LEFT  HEART CATHETERIZATION WITH CORONARY ANGIOGRAM N/A 11/05/2013   Procedure: LEFT HEART CATHETERIZATION WITH CORONARY ANGIOGRAM;  Surgeon: Laverda Page, MD;  Location: [facility] CATH LAB;  Service: Cardiovascular;  Laterality: N/A;  . MITRAL VALVE REPLACEMENT N/A 11/08/2013   Procedure: MITRAL VALVE (MV) REPLACEMENT;  Surgeon: Ivin Poot, MD;  Location: [place];  Service: Open Heart Surgery;  Laterality: N/A;  #25 MAGNA MITRAL EASE  . PERCUTANEOUS CORONARY STENT INTERVENTION (PCI-S)  09/03/2012   Procedure: PERCUTANEOUS CORONARY STENT INTERVENTION (PCI-S);  Surgeon: Laverda Page, MD;  Location: [facility] CATH LAB;  Service: Cardiovascular;;  . TOE AMPUTATION Left 2013   left second toe amputation  . TONSILLECTOMY     Current Outpatient Prescriptions on File Prior to Visit  Medication Sig Dispense Refill  . ACCU-CHEK FASTCLIX LANCETS MISC Check BS 4x daily due to fluctuating blood sugars - Fasting, before meals and QHS- Dx: E11.65 500 each 3  . amLODipine (NORVASC) 5 MG tablet Take 1 tablet (5 mg total) by mouth daily. 90 tablet 3  . ascorbic acid (VITAMIN C) 500 MG tablet Take 1 tablet (500 mg total) by mouth 2 (two) times daily. 60 tablet 11  . aspirin 81 MG chewable tablet Chew 2 tablets (162 mg total) by mouth daily. 60 tablet 11  . B-D ULTRAFINE III SHORT PEN 31G X 8 MM MISC 5 each by Does not apply route 5 (five) times daily. Use to inject insulin 5x daily 500 each  3  . cetirizine (ZYRTEC) 10 MG tablet Take 1 tablet (10 mg total) by mouth at bedtime. 30 tablet 11  . feeding supplement, GLUCERNA SHAKE, (GLUCERNA SHAKE) LIQD Take 237 mLs by mouth 3 (three) times daily with meals.  0  . ferrous sulfate 325 (65 FE) MG tablet Take 1 tablet (325 mg total) by mouth 2 (two) times daily with a meal. 60 tablet 11  . fluticasone (FLONASE) 50 MCG/ACT nasal spray Place 1 spray into both nostrils daily. 48 g 3  . glucose blood (ACCU-CHEK SMARTVIEW) test strip Check BS 4x daily due to fluctuating blood sugars - Fasting, before meals and QHS- Dx: E11.65 500 each 3  . HYDROmorphone (DILAUDID) 2 MG tablet Take 1 tablet (2 mg total) by mouth every 4 (four) hours as needed for severe pain. 30 tablet 0  . insulin aspart (NOVOLOG FLEXPEN) 100 UNIT/ML FlexPen 70-120=0    251-300=5 121-150=1  301-350=7 151-200=2  351-400=9 201-250=3  400 or >, call MD  units of insulin 45 mL 3  . Insulin Glargine (LANTUS SOLOSTAR) 100 UNIT/ML Solostar Pen INJECT 20 UNITS SUBCUTANEOUSLY TWICE DAILY 45 mL 3  . losartan (COZAAR) 25 MG tablet 1 tab po qd  2  . Melatonin 3 MG TABS Take 1.5 mg by mouth at bedtime as needed (insomnia). Reported on 05/15/2015    . metoprolol tartrate (LOPRESSOR) 25 MG tablet TAKE 1 TABLET(25 MG) BY MOUTH TWICE DAILY (Patient taking differently: 12.5 mg. TAKE 1 TABLET(25 MG) BY MOUTH TWICE DAILY) 180 tablet 1  . nitroGLYCERIN (NITROSTAT) 0.4 MG SL tablet Place 1 tablet (0.4 mg total) under the tongue every 5 (five) minutes as needed for chest pain. 25 tablet 2  . ondansetron (ZOFRAN) 4 MG tablet Take 4 mg by mouth every 8 (eight) hours as needed for nausea or vomiting.    . pantoprazole (PROTONIX) 40 MG tablet TAKE 1 TABLET(40 MG) BY MOUTH DAILY 90 tablet 1  . simvastatin (ZOCOR) 20 MG tablet TAKE 1 TABLET(20 MG) BY MOUTH DAILY 90  tablet 1  . torsemide (DEMADEX) 20 MG tablet Take 1 tablet (20 mg total) by mouth 2 (two) times daily. (Patient taking differently: Take 20 mg by  mouth 2 (two) times daily. M/W/F pt takes BIDother days QD) 180 tablet 1  . traZODone (DESYREL) 50 MG tablet TAKE 1 TABLET(50 MG) BY MOUTH AT BEDTIME 90 tablet 1   No current facility-administered medications on file prior to visit.    Allergies  Allergen Reactions  . Aspirin Other (See Comments)    High doses caused stomach bleeds   Social History   Social History  . Marital status: Widowed    Spouse name: N/A  . Number of children: N/A  . Years of education: N/A   Occupational History  . Not on file.   Social History Main Topics  . Smoking status: Never Smoker  . Smokeless tobacco: Never Used  . Alcohol use No  . Drug use: No  . Sexual activity: No   Other Topics Concern  . Not on file   Social History Narrative  . No narrative on file   No family history on file.    Review of Systems  All other systems reviewed and are negative.      Objective:   Physical Exam  Constitutional: She is oriented to person, place, and time. She appears well-developed and well-nourished. No distress.  HENT:  Head: Normocephalic and atraumatic.  Right Ear: External ear normal.  Left Ear: External ear normal.  Nose: Nose normal.  Mouth/Throat: Oropharynx is clear and moist.  Eyes: Conjunctivae and EOM are normal. Pupils are equal, round, and reactive to light. Right eye exhibits no discharge. Left eye exhibits no discharge. No scleral icterus.  Neck: Normal range of motion. Neck supple. No JVD present. No tracheal deviation present. No thyromegaly present.  Cardiovascular: Normal rate, regular rhythm, normal heart sounds and intact distal pulses.  Exam reveals no gallop and no friction rub.   No murmur heard. Pulmonary/Chest: Effort normal and breath sounds normal. No stridor. No respiratory distress. She has no wheezes. She has no rales. She exhibits no tenderness.  Abdominal: Soft. Bowel sounds are normal. She exhibits no distension and no mass. There is no tenderness. There is  no rebound and no guarding.  Musculoskeletal: She exhibits no edema.  Lymphadenopathy:    She has no cervical adenopathy.  Neurological: She is alert and oriented to person, place, and time. She has normal reflexes. No cranial nerve deficit. She exhibits normal muscle tone. Coordination normal.  Skin: Skin is warm. No rash noted. She is not diaphoretic. No erythema. No pallor.  Psychiatric: She has a normal mood and affect. Her behavior is normal. Judgment and thought content normal.  Vitals reviewed.        Assessment & Plan:  Diabetes mellitus, type II, insulin dependent ([place]) - Plan: Ambulatory referral to Podiatry, CBC with Differential/Platelet, COMPLETE METABOLIC PANEL WITH GFR, Hemoglobin A1c  Acute cystitis without hematuria - Plan: CULTURE, URINE COMPREHENSIVE, cephALEXin (KEFLEX) 500 MG capsule  Symptoms sound like a urinary tract infection. Begin Keflex 500 mg by mouth 3 times a day for 7 days. Send urine culture. Blood pressure at home is excellent but I will discontinue amlodipine because of the fluid retention. To compensate I will increase losartan to 50 mg a day. I will check her renal function today and check it again in one week when I recheck her blood pressure. I will also check a hemoglobin A1c. Given her age, hemoglobin A1c  less than 7 would be outstanding. For the most part I believe we will be there based on the sugars that she is reporting. Increase fluid pill to torsemide 20 mg twice a day for the next week and then recheck in one week. At that time we will likely have her resume her previous dose of diuretic.

## 2015-10-16 DIAGNOSIS — G4734 Idiopathic sleep related nonobstructive alveolar hypoventilation: Secondary | ICD-10-CM | POA: Diagnosis not present

## 2015-10-16 LAB — CBC WITH DIFFERENTIAL/PLATELET
BASOS PCT: 1 %
Basophils Absolute: 42 cells/uL (ref 0–200)
EOS PCT: 3 %
Eosinophils Absolute: 126 cells/uL (ref 15–500)
HEMATOCRIT: 32.7 % — AB (ref 35.0–45.0)
HEMOGLOBIN: 10.6 g/dL — AB (ref 12.0–15.0)
LYMPHS ABS: 1092 {cells}/uL (ref 850–3900)
Lymphocytes Relative: 26 %
MCH: 30.1 pg (ref 27.0–33.0)
MCHC: 32.4 g/dL (ref 32.0–36.0)
MCV: 92.9 fL (ref 80.0–100.0)
MONO ABS: 378 {cells}/uL (ref 200–950)
MPV: 10.7 fL (ref 7.5–12.5)
Monocytes Relative: 9 %
NEUTROS ABS: 2562 {cells}/uL (ref 1500–7800)
NEUTROS PCT: 61 %
Platelets: 157 10*3/uL (ref 140–400)
RBC: 3.52 MIL/uL — AB (ref 3.80–5.10)
RDW: 14.3 % (ref 11.0–15.0)
WBC: 4.2 10*3/uL (ref 3.8–10.8)

## 2015-10-16 LAB — COMPLETE METABOLIC PANEL WITH GFR
ALBUMIN: 4.1 g/dL (ref 3.6–5.1)
ALK PHOS: 100 U/L (ref 33–130)
ALT: 29 U/L (ref 6–29)
AST: 27 U/L (ref 10–35)
BUN: 74 mg/dL — AB (ref 7–25)
CO2: 24 mmol/L (ref 20–31)
CREATININE: 2.76 mg/dL — AB (ref 0.60–0.88)
Calcium: 9 mg/dL (ref 8.6–10.4)
Chloride: 102 mmol/L (ref 98–110)
GFR, Est African American: 18 mL/min — ABNORMAL LOW (ref >=60)
GFR, Est Non African American: 16 mL/min — ABNORMAL LOW (ref >=60)
GLUCOSE: 146 mg/dL — AB (ref 70–99)
POTASSIUM: 5.2 mmol/L (ref 3.5–5.3)
SODIUM: 136 mmol/L (ref 135–146)
Total Bilirubin: 0.7 mg/dL (ref 0.2–1.2)
Total Protein: 6.8 g/dL (ref 6.1–8.1)

## 2015-10-16 LAB — HEMOGLOBIN A1C
Hgb A1c MFr Bld: 5.9 % — ABNORMAL HIGH (ref <5.7)
Mean Plasma Glucose: 123 mg/dL

## 2015-10-17 LAB — CULTURE, URINE COMPREHENSIVE: Organism ID, Bacteria: NO GROWTH

## 2015-10-22 ENCOUNTER — Ambulatory Visit (INDEPENDENT_AMBULATORY_CARE_PROVIDER_SITE_OTHER): Payer: Commercial Managed Care - HMO | Admitting: Family Medicine

## 2015-10-22 ENCOUNTER — Encounter: Payer: Self-pay | Admitting: Family Medicine

## 2015-10-22 VITALS — BP 112/56 | HR 62 | Temp 98.4°F | Resp 16 | Ht 67.0 in | Wt 168.0 lb

## 2015-10-22 DIAGNOSIS — N184 Chronic kidney disease, stage 4 (severe): Secondary | ICD-10-CM | POA: Diagnosis not present

## 2015-10-22 NOTE — Progress Notes (Signed)
Subjective:    Patient ID: Claudia [name], female    DOB: 03-Feb-1932, 82 y.o.   MRN: [id number]  HPI 08/2014 Patient is here today for complete physical exam. She looks much better from the last time I saw her. She is now able to stand up from a seated position just holding onto the counter. This is a marked improvement from when she was wheelchair-bound just a month ago. She is more alert. Her memory has improved. She is exercising around the house by walking with a walker. She has not had a urinary tract infection and almost a month. Overall she seems to be improving dramatically. She is still very weak and she still requires a walker to ambulate but again I cannot emphasize how much improvement she is demonstrating from her last visit. We had a long discussion today regarding cancer screening. Just earlier this year I was discussing hospice placement with this patient. Therefore I do not believe it makes medical sense to pursue cancer screening such as a mammogram, Pap smear, colonoscopy. Both the patient and her son are in agreement. She is due for Pneumovax 23 today. She will receive that. She has had Prevnar 13. Otherwise her preventative care is up-to-date.  At that time, my plan was: Physical exam is significant for several cancerous appearing lesions on her left cheek. One appears to be a basal cell cancer. The other appears to be a squamous cell cancer. Given their size and location, I will refer the patient to a dermatologist. Patient received Pneumovax 23 today. Her weight has stabilized and has actually improved 3 pounds. She is no longer rapidly losing weight. Her pulmonary edema has resolved. She does have some trace peripheral edema but this is good for this patient and is at her baseline. Her muscle strength and deconditioning appears to be improving. I will obtain a CBC, CMP, fasting lipid panel, hemoglobin A1c, and a urinalysis. Goal hemoglobin A1c is less than 8 given the patient's  frailty. At the present time on Lantus 18 units twice daily, her blood sugars are all less than 200 and I believe this is acceptable. I will check a urinalysis to rule out a urinary tract infection as this is seem to be the cause of the patient's decompensation recently and I want to be proactive about finding that prior to it causing her to decompensate.  12/26/14 Patient's weight has fallen to 164 pounds. She reports dry mouth as well as some orthostatic dizziness. Her blood pressure today is relatively low at 100/62. Her heart rate typically runs between 50 and 60 bpm. She is currently on metoprolol 12.5 mg by mouth twice a day as well as torsemide by mouth twice a day.  However the patient has done remarkably well since when I last saw her. She is now walking around her home without the use of a walker which I feel is a minor miracle given how decompensated she was earlier this year. At that time I was even considering hospice but the patient has rallied  And her muscle tone and strength has improved dramatically. Earlier last week she was even walking around [place] simply using a buggy for stability.  Her blood pressure typically ranges between 15-615 systolic over 37-94 diastolic.  Her blood sugars have been fluctuating between 70 and 200 although typically they're less than 170 which I feel is entirely appropriate given her age and comorbidities. Her last hemoglobin A1c was excellent at 7.1. She is due today for  her flu shot.  At that time, my plan was: Given her low blood pressure and her  Relative bradycardia, I will continue metoprolol 12.5 mg by mouth twice a day. However given her chronic kidney disease, or loss of weight, and her dry mouth, I would recommend decreasing torsemide to once a day. Her insulin seems appropriately dosed based on her blood sugars. She's been taking 10 units in the morning and 8 units in the evening. I have recommended just taking 18 units once a day for simplicity sake.  She also received her flu shot today. Recheck fasting lab work after the first of the year.  04/27/15 Since I last saw the patient, she has been doing exceptionally well. She is currently taking Lantus 20 units subcutaneous daily. She may use 1-2 units of NovoLog with meals depending upon what her sugars are. Her most recent hemoglobin A1c which was checked at her cardiologist's office was found to be 6.9. Her fasting blood sugars are under 130 and her 2 hour postprandial sugars are well below 180.  She denies any hypoglycemia. Her strength continues to improve. She is now able to walk around [place] with minimal assistance. She is able to walk out and get into her son's truck without assistance. This is dramatically improved from last time I saw her. Her weight remains stable at 167 pounds. She denies any chest pain shortness of breath or dyspnea on exertion. However her blood pressure has been elevated recently. Her cardiologist recently started her on losartan 25 mg by mouth daily. Her blood pressure continues to remain high at 160/62. However her creatinine recently increased as well since her cardiologist started her on losartan.  At that time, my plan was: I am very pleased with her blood sugars. I did ask her son to bring a copy of her lab work and so that I can look at her cholesterol, her CMP, fasting lipid panel, and her hemoglobin A1c but I would make no changes based on the numbers he is telling me. Her blood pressure is slightly elevated and therefore I also added amlodipine 5 mg by mouth daily to help control her blood pressure without exacerbating her chronic kidney disease. Recheck blood pressure in one month but otherwise I am ecstatic about how well her sugars are controlled on how well she is doing. I will make no changes in her medication at this time. Her goal LDL cholesterol is less than 70.  Because of her CKD, I believe she is a poor candidate for entresto.  10/15/15 Wt Readings from  Last 3 Encounters:  10/15/15 169 lb (76.7 kg)  09/23/15 166 lb (75.3 kg)  04/27/15 167 lb (75.8 kg)   Patient continues to do remarkably well. Her weight is essentially the same as it was in February. Unfortunately she is retaining a significant amount of fluid in her legs. She has +1 to +2 pitting edema to the knees in both legs. This is been going on for several months. It seems to coincide with when I put her on amlodipine for her blood pressure at the last visit. She is still taking her fluid pill. She is taking it twice a day on Mondays Wednesdays and Fridays and once a day on all other days. She denies any chest pain shortness of breath or dyspnea on exertion. Her fasting blood sugars are typically between 101 130. Her two-hour postprandial sugars are below 200 the vast majority of the time. Her blood pressure at home ranges between  130 and 147/60-80. Overall I'm very happy with her numbers. She does report dysuria urgency and frequency for the last week or so. I cannot perform a urinalysis today is her lab is under Architect. I can send a urine culture.  At that time, my plan was: Symptoms sound like a urinary tract infection. Begin Keflex 500 mg by mouth 3 times a day for 7 days. Send urine culture. Blood pressure at home is excellent but I will discontinue amlodipine because of the fluid retention. To compensate I will increase losartan to 50 mg a day. I will check her renal function today and check it again in one week when I recheck her blood pressure. I will also check a hemoglobin A1c. Given her age, hemoglobin A1c less than 7 would be outstanding. For the most part I believe we will be there based on the sugars that she is reporting. Increase fluid pill to torsemide 20 mg twice a day for the next week and then recheck in one week. At that time we will likely have her resume her previous dose of diuretic.  No visits with results within 1 Week(s) from this visit.  Latest known visit with  results is:  Office Visit on 10/15/2015  Component Date Value Ref Range Status  . WBC 10/16/2015 4.2  3.8 - 10.8 K/uL Final  . RBC 10/16/2015 3.52* 3.80 - 5.10 MIL/uL Final  . Hemoglobin 10/16/2015 10.6* 12.0 - 15.0 g/dL Final  . HCT 10/16/2015 32.7* 35.0 - 45.0 % Final  . MCV 10/16/2015 92.9  80.0 - 100.0 fL Final  . MCH 10/16/2015 30.1  27.0 - 33.0 pg Final  . MCHC 10/16/2015 32.4  32.0 - 36.0 g/dL Final  . RDW 10/16/2015 14.3  11.0 - 15.0 % Final  . Platelets 10/16/2015 157  140 - 400 K/uL Final  . MPV 10/16/2015 10.7  7.5 - 12.5 fL Final  . Neutro Abs 10/16/2015 2562  1,500 - 7,800 cells/uL Final  . Lymphs Abs 10/16/2015 1092  850 - 3,900 cells/uL Final  . Monocytes Absolute 10/16/2015 378  200 - 950 cells/uL Final  . Eosinophils Absolute 10/16/2015 126  15 - 500 cells/uL Final  . Basophils Absolute 10/16/2015 42  0 - 200 cells/uL Final  . Neutrophils Relative % 10/16/2015 61  % Final  . Lymphocytes Relative 10/16/2015 26  % Final  . Monocytes Relative 10/16/2015 9  % Final  . Eosinophils Relative 10/16/2015 3  % Final  . Basophils Relative 10/16/2015 1  % Final  . Smear Review 10/16/2015 Criteria for review not met   Final  . Sodium 10/16/2015 136  135 - 146 mmol/L Final  . Potassium 10/16/2015 5.2  3.5 - 5.3 mmol/L Final  . Chloride 10/16/2015 102  98 - 110 mmol/L Final  . CO2 10/16/2015 24  20 - 31 mmol/L Final  . Glucose, Bld 10/16/2015 146* 70 - 99 mg/dL Final  . BUN 10/16/2015 74* 7 - 25 mg/dL Final  . Creat 10/16/2015 2.76* 0.60 - 0.88 mg/dL Final   Comment:   For patients > or = 80 years of age: The upper reference limit for Creatinine is approximately 13% higher for people identified as African-American.     . Total Bilirubin 10/16/2015 0.7  0.2 - 1.2 mg/dL Final  . Alkaline Phosphatase 10/16/2015 100  33 - 130 U/L Final  . AST 10/16/2015 27  10 - 35 U/L Final  . ALT 10/16/2015 29  6 - 29 U/L Final  .  Total Protein 10/16/2015 6.8  6.1 - 8.1 g/dL Final  . Albumin  10/16/2015 4.1  3.6 - 5.1 g/dL Final  . Calcium 10/16/2015 9.0  8.6 - 10.4 mg/dL Final  . GFR, Est African American 10/16/2015 18* >=60 mL/min Final  . GFR, Est Non African American 10/16/2015 16* >=60 mL/min Final  . Hgb A1c MFr Bld 10/16/2015 5.9* <5.7 % Final   Comment:   For someone without known diabetes, a hemoglobin A1c value between 5.7% and 6.4% is consistent with prediabetes and should be confirmed with a follow-up test.   For someone with known diabetes, a value <7% indicates that their diabetes is well controlled. A1c targets should be individualized based on duration of diabetes, age, co-morbid conditions and other considerations.   This assay result is consistent with an increased risk of diabetes.   Currently, no consensus exists regarding use of hemoglobin A1c for diagnosis of diabetes in children.     . Mean Plasma Glucose 10/16/2015 123  mg/dL Final  . Organism ID, Bacteria 10/17/2015 NO GROWTH   Final   10/22/15 Renal function was worse, so I had the patient hold amlodipine, I did not increase losartan, and I did increase the lasix as planned.   Unfortunately, we were unable to reach the son with this information. Therefore she doubled her dose of Lasix and increase her losartan. The swelling in her legs is much better. They're still pitting edema around her ankles but not as prevalent as it was earlier this week. Her blood pressure is excellent. In fact is a little on the low side. She denies any chest pain or shortness of breath.  Past Medical History:  Diagnosis Date  . Anemia   . Arthritis   . Cancer (HCC)    hx of skin cancer  . CHF (congestive heart failure) ([place])   . Chronic renal failure, stage 3 (moderate)   . Coronary artery disease   . Diabetes mellitus    insulin dependent  . GERD (gastroesophageal reflux disease)   . Headache(784.0)    rare migraines  . Hypercholesterolemia   . Hypertension   . Myocardial infarction ([place]) 2013  . Osteopenia    . Proliferative retinopathy due to DM ([place])   . PUD (peptic ulcer disease)   . S/P colonoscopy Sept 2011   left-sided diverticula, tubular adenoma  . S/P endoscopy Aug 2011   3 superficial gastric ulcers, NSAID-induced  . Shortness of breath   . SIADH (syndrome of inappropriate ADH production) ([place])    Past Surgical History:  Procedure Laterality Date  . APPENDECTOMY    . CARDIAC CATHETERIZATION  01/22/2013  . cardiac stents  07/19/2011  . COLONOSCOPY  12/04/09   normal rectum/left-sided diverticula  . CORONARY ANGIOPLASTY  07/2011  . CORONARY ARTERY BYPASS GRAFT N/A 11/08/2013   Procedure: CORONARY ARTERY BYPASS GRAFTING (CABG), on pump, times two, using left internal mammary artery, cryo saphenous vein.;  Surgeon: Ivin Poot, MD;  Location: [place];  Service: Open Heart Surgery;  Laterality: N/A;  LIMA-LAD CRYOVEIN -OM  . ESOPHAGOGASTRODUODENOSCOPY  10/16/09   normal without barrett's/three superficial gastric ulcers  . EYE SURGERY     cataracts  . INTRAOPERATIVE TRANSESOPHAGEAL ECHOCARDIOGRAM N/A 11/08/2013   Procedure: INTRAOPERATIVE TRANSESOPHAGEAL ECHOCARDIOGRAM;  Surgeon: Ivin Poot, MD;  Location: [place];  Service: Open Heart Surgery;  Laterality: N/A;  . JOINT REPLACEMENT  arthroscopy to knee  . LEFT HEART CATHETERIZATION WITH CORONARY ANGIOGRAM N/A 07/19/2011   Procedure: LEFT HEART  CATHETERIZATION WITH CORONARY ANGIOGRAM;  Surgeon: Laverda Page, MD;  Location[facility] CATH LAB;  Service: Cardiovascular;  Laterality: N/A;  . LEFT HEART CATHETERIZATION WITH CORONARY ANGIOGRAM N/A 12/21/2011   Procedure: LEFT HEART CATHETERIZATION WITH CORONARY ANGIOGRAM;  Surgeon: Laverda Page, MD;  Location: [facility] CATH LAB;  Service: Cardiovascular;  Laterality: N/A;  . LEFT HEART CATHETERIZATION WITH CORONARY ANGIOGRAM N/A 02/20/2012   Procedure: LEFT HEART CATHETERIZATION WITH CORONARY ANGIOGRAM;  Surgeon: Laverda Page, MD;  Location: [facility] CATH LAB;  Service: Cardiovascular;   Laterality: N/A;  . LEFT HEART CATHETERIZATION WITH CORONARY ANGIOGRAM N/A 09/03/2012   Procedure: LEFT HEART CATHETERIZATION WITH CORONARY ANGIOGRAM;  Surgeon: Laverda Page, MD;  Location: [facility] CATH LAB;  Service: Cardiovascular;  Laterality: N/A;  . LEFT HEART CATHETERIZATION WITH CORONARY ANGIOGRAM N/A 12/04/2012   Procedure: LEFT HEART CATHETERIZATION WITH CORONARY ANGIOGRAM;  Surgeon: Laverda Page, MD;  Location: [facility] CATH LAB;  Service: Cardiovascular;  Laterality: N/A;  . LEFT HEART CATHETERIZATION WITH CORONARY ANGIOGRAM N/A 01/22/2013   Procedure: LEFT HEART CATHETERIZATION WITH CORONARY ANGIOGRAM;  Surgeon: Laverda Page, MD;  Location: [facility] CATH LAB;  Service: Cardiovascular;  Laterality: N/A;  . LEFT HEART CATHETERIZATION WITH CORONARY ANGIOGRAM N/A 06/26/2013   Procedure: LEFT HEART CATHETERIZATION WITH CORONARY ANGIOGRAM;  Surgeon: Laverda Page, MD;  Location: [facility] CATH LAB;  Service: Cardiovascular;  Laterality: N/A;  . LEFT HEART CATHETERIZATION WITH CORONARY ANGIOGRAM N/A 11/05/2013   Procedure: LEFT HEART CATHETERIZATION WITH CORONARY ANGIOGRAM;  Surgeon: Laverda Page, MD;  Location: [facility] CATH LAB;  Service: Cardiovascular;  Laterality: N/A;  . MITRAL VALVE REPLACEMENT N/A 11/08/2013   Procedure: MITRAL VALVE (MV) REPLACEMENT;  Surgeon: Ivin Poot, MD;  Location: [place];  Service: Open Heart Surgery;  Laterality: N/A;  #25 MAGNA MITRAL EASE  . PERCUTANEOUS CORONARY STENT INTERVENTION (PCI-S)  09/03/2012   Procedure: PERCUTANEOUS CORONARY STENT INTERVENTION (PCI-S);  Surgeon: Laverda Page, MD;  Location: [facility] CATH LAB;  Service: Cardiovascular;;  . TOE AMPUTATION Left 2013   left second toe amputation  . TONSILLECTOMY     Current Outpatient Prescriptions on File Prior to Visit  Medication Sig Dispense Refill  . ACCU-CHEK FASTCLIX LANCETS MISC Check BS 4x daily due to fluctuating blood sugars - Fasting, before meals and QHS- Dx: E11.65 500 each 3  . amLODipine  (NORVASC) 5 MG tablet Take 1 tablet (5 mg total) by mouth daily. 90 tablet 3  . ascorbic acid (VITAMIN C) 500 MG tablet Take 1 tablet (500 mg total) by mouth 2 (two) times daily. 60 tablet 11  . aspirin 81 MG chewable tablet Chew 2 tablets (162 mg total) by mouth daily. 60 tablet 11  . B-D ULTRAFINE III SHORT PEN 31G X 8 MM MISC 5 each by Does not apply route 5 (five) times daily. Use to inject insulin 5x daily 500 each 3  . cephALEXin (KEFLEX) 500 MG capsule Take 1 capsule (500 mg total) by mouth 3 (three) times daily. 21 capsule 0  . cetirizine (ZYRTEC) 10 MG tablet Take 1 tablet (10 mg total) by mouth at bedtime. 30 tablet 11  . feeding supplement, GLUCERNA SHAKE, (GLUCERNA SHAKE) LIQD Take 237 mLs by mouth 3 (three) times daily with meals.  0  . ferrous sulfate 325 (65 FE) MG tablet Take 1 tablet (325 mg total) by mouth 2 (two) times daily with a meal. 60 tablet 11  . fluticasone (FLONASE) 50 MCG/ACT nasal spray Place 1 spray into both nostrils  daily. 48 g 3  . glucose blood (ACCU-CHEK SMARTVIEW) test strip Check BS 4x daily due to fluctuating blood sugars - Fasting, before meals and QHS- Dx: E11.65 500 each 3  . HYDROmorphone (DILAUDID) 2 MG tablet Take 1 tablet (2 mg total) by mouth every 4 (four) hours as needed for severe pain. (Patient not taking: Reported on 10/15/2015) 30 tablet 0  . insulin aspart (NOVOLOG FLEXPEN) 100 UNIT/ML FlexPen 70-120=0    251-300=5 121-150=1  301-350=7 151-200=2  351-400=9 201-250=3  400 or >, call MD  units of insulin (Patient not taking: Reported on 10/15/2015) 45 mL 3  . Insulin Glargine (LANTUS SOLOSTAR) 100 UNIT/ML Solostar Pen INJECT 20 UNITS SUBCUTANEOUSLY TWICE DAILY (Patient taking differently: Inject 20 Units into the skin daily. INJECT 20 UNITS SUBCUTANEOUSLY TWICE DAILY) 45 mL 3  . losartan (COZAAR) 25 MG tablet 1 tab po qd  2  . metoprolol tartrate (LOPRESSOR) 25 MG tablet TAKE 1 TABLET(25 MG) BY MOUTH TWICE DAILY (Patient taking differently: 12.5 mg.  TAKE 1 TABLET(25 MG) BY MOUTH TWICE DAILY) 180 tablet 1  . nitroGLYCERIN (NITROSTAT) 0.4 MG SL tablet Place 1 tablet (0.4 mg total) under the tongue every 5 (five) minutes as needed for chest pain. 25 tablet 2  . ondansetron (ZOFRAN) 4 MG tablet Take 4 mg by mouth every 8 (eight) hours as needed for nausea or vomiting.    . pantoprazole (PROTONIX) 40 MG tablet TAKE 1 TABLET(40 MG) BY MOUTH DAILY 90 tablet 1  . simvastatin (ZOCOR) 20 MG tablet TAKE 1 TABLET(20 MG) BY MOUTH DAILY 90 tablet 1  . torsemide (DEMADEX) 20 MG tablet Take 1 tablet (20 mg total) by mouth 2 (two) times daily. (Patient taking differently: Take 20 mg by mouth 2 (two) times daily. M/W/F pt takes BIDother days QD) 180 tablet 1  . traZODone (DESYREL) 50 MG tablet TAKE 1 TABLET(50 MG) BY MOUTH AT BEDTIME 90 tablet 1   No current facility-administered medications on file prior to visit.    Allergies  Allergen Reactions  . Aspirin Other (See Comments)    High doses caused stomach bleeds   Social History   Social History  . Marital status: Widowed    Spouse name: N/A  . Number of children: N/A  . Years of education: N/A   Occupational History  . Not on file.   Social History Main Topics  . Smoking status: Never Smoker  . Smokeless tobacco: Never Used  . Alcohol use No  . Drug use: No  . Sexual activity: No   Other Topics Concern  . Not on file   Social History Narrative  . No narrative on file   No family history on file.    Review of Systems  All other systems reviewed and are negative.      Objective:   Physical Exam  Constitutional: She is oriented to person, place, and time. She appears well-developed and well-nourished. No distress.  HENT:  Head: Normocephalic and atraumatic.  Right Ear: External ear normal.  Left Ear: External ear normal.  Nose: Nose normal.  Mouth/Throat: Oropharynx is clear and moist.  Eyes: Conjunctivae and EOM are normal. Pupils are equal, round, and reactive to light.  Right eye exhibits no discharge. Left eye exhibits no discharge. No scleral icterus.  Neck: Normal range of motion. Neck supple. No JVD present. No tracheal deviation present. No thyromegaly present.  Cardiovascular: Normal rate, regular rhythm, normal heart sounds and intact distal pulses.  Exam reveals no gallop  and no friction rub.   No murmur heard. Pulmonary/Chest: Effort normal and breath sounds normal. No stridor. No respiratory distress. She has no wheezes. She has no rales. She exhibits no tenderness.  Abdominal: Soft. Bowel sounds are normal. She exhibits no distension and no mass. There is no tenderness. There is no rebound and no guarding.  Musculoskeletal: She exhibits no edema.  Lymphadenopathy:    She has no cervical adenopathy.  Neurological: She is alert and oriented to person, place, and time. She has normal reflexes. No cranial nerve deficit. She exhibits normal muscle tone. Coordination normal.  Skin: Skin is warm. No rash noted. She is not diaphoretic. No erythema. No pallor.  Psychiatric: She has a normal mood and affect. Her behavior is normal. Judgment and thought content normal.  Vitals reviewed.  Wt Readings from Last 3 Encounters:  10/22/15 168 lb (76.2 kg)  10/15/15 169 lb (76.7 kg)  09/23/15 166 lb (75.3 kg)         Assessment & Plan:   Chronic kidney disease (CKD), stage IV (severe) ([place]) - Plan: BASIC METABOLIC PANEL WITH GFR  Given her renal function I want him to decrease losartan back to 25 mg a day. Stay off the amlodipine as this is likely contributing to her swelling. Resume her previous dose of Lasix. Recheck her renal function. If worsening, we may need to hold losartan for short period of time. I encouraged the patient to drink 4-5 glasses of water a day

## 2015-10-23 LAB — BASIC METABOLIC PANEL WITH GFR
BUN: 60 mg/dL — ABNORMAL HIGH (ref 7–25)
CALCIUM: 8.7 mg/dL (ref 8.6–10.4)
CO2: 21 mmol/L (ref 20–31)
CREATININE: 2.24 mg/dL — AB (ref 0.60–0.88)
Chloride: 103 mmol/L (ref 98–110)
GFR, EST AFRICAN AMERICAN: 23 mL/min — AB (ref >=60)
GFR, EST NON AFRICAN AMERICAN: 20 mL/min — AB (ref >=60)
Glucose, Bld: 126 mg/dL — ABNORMAL HIGH (ref 70–99)
Potassium: 4.5 mmol/L (ref 3.5–5.3)
SODIUM: 138 mmol/L (ref 135–146)

## 2015-10-27 ENCOUNTER — Telehealth: Payer: Self-pay | Admitting: Family Medicine

## 2015-10-27 NOTE — Telephone Encounter (Signed)
Given her kidney problems I would not increase losartan further.  Make sur eshe has stopped amlodipine and start cardura 2 mg poqday in its place.  Recheck bp in 2 weeks.

## 2015-10-27 NOTE — Telephone Encounter (Signed)
Son states that since changing pt's BP meds her BP has been running high - this morning it was 168/74 and has been like that since her OV.

## 2015-10-28 NOTE — Telephone Encounter (Signed)
Patient's son aware of providers recommendations via vm and instructed to call back in 2 weeks with BP readings.

## 2015-10-29 ENCOUNTER — Other Ambulatory Visit: Payer: Self-pay | Admitting: Family Medicine

## 2015-10-29 MED ORDER — DOXAZOSIN MESYLATE 2 MG PO TABS: 2.0000 mg | ORAL_TABLET | Freq: Every day | ORAL | 3 refills | Status: DC

## 2015-10-29 NOTE — Telephone Encounter (Signed)
Cardura called into pharm

## 2015-11-13 ENCOUNTER — Ambulatory Visit (INDEPENDENT_AMBULATORY_CARE_PROVIDER_SITE_OTHER): Payer: Commercial Managed Care - HMO | Admitting: Family Medicine

## 2015-11-13 ENCOUNTER — Encounter: Payer: Self-pay | Admitting: Family Medicine

## 2015-11-13 VITALS — BP 119/58 | Temp 98.1°F | Resp 16 | Ht 67.0 in | Wt 164.0 lb

## 2015-11-13 DIAGNOSIS — I10 Essential (primary) hypertension: Secondary | ICD-10-CM | POA: Diagnosis not present

## 2015-11-13 DIAGNOSIS — Z23 Encounter for immunization: Secondary | ICD-10-CM | POA: Diagnosis not present

## 2015-11-13 NOTE — Progress Notes (Signed)
Subjective:    Patient ID: Claudia [name], female    DOB: 03-Feb-1932, 82 y.o.   MRN: [id number]  HPI 08/2014 Patient is here today for complete physical exam. She looks much better from the last time I saw her. She is now able to stand up from a seated position just holding onto the counter. This is a marked improvement from when she was wheelchair-bound just a month ago. She is more alert. Her memory has improved. She is exercising around the house by walking with a walker. She has not had a urinary tract infection and almost a month. Overall she seems to be improving dramatically. She is still very weak and she still requires a walker to ambulate but again I cannot emphasize how much improvement she is demonstrating from her last visit. We had a long discussion today regarding cancer screening. Just earlier this year I was discussing hospice placement with this patient. Therefore I do not believe it makes medical sense to pursue cancer screening such as a mammogram, Pap smear, colonoscopy. Both the patient and her son are in agreement. She is due for Pneumovax 23 today. She will receive that. She has had Prevnar 13. Otherwise her preventative care is up-to-date.  At that time, my plan was: Physical exam is significant for several cancerous appearing lesions on her left cheek. One appears to be a basal cell cancer. The other appears to be a squamous cell cancer. Given their size and location, I will refer the patient to a dermatologist. Patient received Pneumovax 23 today. Her weight has stabilized and has actually improved 3 pounds. She is no longer rapidly losing weight. Her pulmonary edema has resolved. She does have some trace peripheral edema but this is good for this patient and is at her baseline. Her muscle strength and deconditioning appears to be improving. I will obtain a CBC, CMP, fasting lipid panel, hemoglobin A1c, and a urinalysis. Goal hemoglobin A1c is less than 8 given the patient's  frailty. At the present time on Lantus 18 units twice daily, her blood sugars are all less than 200 and I believe this is acceptable. I will check a urinalysis to rule out a urinary tract infection as this is seem to be the cause of the patient's decompensation recently and I want to be proactive about finding that prior to it causing her to decompensate.  12/26/14 Patient's weight has fallen to 164 pounds. She reports dry mouth as well as some orthostatic dizziness. Her blood pressure today is relatively low at 100/62. Her heart rate typically runs between 50 and 60 bpm. She is currently on metoprolol 12.5 mg by mouth twice a day as well as torsemide by mouth twice a day.  However the patient has done remarkably well since when I last saw her. She is now walking around her home without the use of a walker which I feel is a minor miracle given how decompensated she was earlier this year. At that time I was even considering hospice but the patient has rallied  And her muscle tone and strength has improved dramatically. Earlier last week she was even walking around [place] simply using a buggy for stability.  Her blood pressure typically ranges between 15-615 systolic over 37-94 diastolic.  Her blood sugars have been fluctuating between 70 and 200 although typically they're less than 170 which I feel is entirely appropriate given her age and comorbidities. Her last hemoglobin A1c was excellent at 7.1. She is due today for  her flu shot.  At that time, my plan was: Given her low blood pressure and her  Relative bradycardia, I will continue metoprolol 12.5 mg by mouth twice a day. However given her chronic kidney disease, or loss of weight, and her dry mouth, I would recommend decreasing torsemide to once a day. Her insulin seems appropriately dosed based on her blood sugars. She's been taking 10 units in the morning and 8 units in the evening. I have recommended just taking 18 units once a day for simplicity sake.  She also received her flu shot today. Recheck fasting lab work after the first of the year.  04/27/15 Since I last saw the patient, she has been doing exceptionally well. She is currently taking Lantus 20 units subcutaneous daily. She may use 1-2 units of NovoLog with meals depending upon what her sugars are. Her most recent hemoglobin A1c which was checked at her cardiologist's office was found to be 6.9. Her fasting blood sugars are under 130 and her 2 hour postprandial sugars are well below 180.  She denies any hypoglycemia. Her strength continues to improve. She is now able to walk around [place] with minimal assistance. She is able to walk out and get into her son's truck without assistance. This is dramatically improved from last time I saw her. Her weight remains stable at 167 pounds. She denies any chest pain shortness of breath or dyspnea on exertion. However her blood pressure has been elevated recently. Her cardiologist recently started her on losartan 25 mg by mouth daily. Her blood pressure continues to remain high at 160/62. However her creatinine recently increased as well since her cardiologist started her on losartan.  At that time, my plan was: I am very pleased with her blood sugars. I did ask her son to bring a copy of her lab work and so that I can look at her cholesterol, her CMP, fasting lipid panel, and her hemoglobin A1c but I would make no changes based on the numbers he is telling me. Her blood pressure is slightly elevated and therefore I also added amlodipine 5 mg by mouth daily to help control her blood pressure without exacerbating her chronic kidney disease. Recheck blood pressure in one month but otherwise I am ecstatic about how well her sugars are controlled on how well she is doing. I will make no changes in her medication at this time. Her goal LDL cholesterol is less than 70.  Because of her CKD, I believe she is a poor candidate for entresto.  10/15/15 Wt Readings from  Last 3 Encounters:  10/22/15 168 lb (76.2 kg)  10/15/15 169 lb (76.7 kg)  09/23/15 166 lb (75.3 kg)   Patient continues to do remarkably well. Her weight is essentially the same as it was in February. Unfortunately she is retaining a significant amount of fluid in her legs. She has +1 to +2 pitting edema to the knees in both legs. This is been going on for several months. It seems to coincide with when I put her on amlodipine for her blood pressure at the last visit. She is still taking her fluid pill. She is taking it twice a day on Mondays Wednesdays and Fridays and once a day on all other days. She denies any chest pain shortness of breath or dyspnea on exertion. Her fasting blood sugars are typically between 101 130. Her two-hour postprandial sugars are below 200 the vast majority of the time. Her blood pressure at home ranges between  130 and 147/60-80. Overall I'm very happy with her numbers. She does report dysuria urgency and frequency for the last week or so. I cannot perform a urinalysis today is her lab is under Architect. I can send a urine culture.  At that time, my plan was: Symptoms sound like a urinary tract infection. Begin Keflex 500 mg by mouth 3 times a day for 7 days. Send urine culture. Blood pressure at home is excellent but I will discontinue amlodipine because of the fluid retention. To compensate I will increase losartan to 50 mg a day. I will check her renal function today and check it again in one week when I recheck her blood pressure. I will also check a hemoglobin A1c. Given her age, hemoglobin A1c less than 7 would be outstanding. For the most part I believe we will be there based on the sugars that she is reporting. Increase fluid pill to torsemide 20 mg twice a day for the next week and then recheck in one week. At that time we will likely have her resume her previous dose of diuretic.  No visits with results within 1 Week(s) from this visit.  Latest known visit with  results is:  Office Visit on 10/22/2015  Component Date Value Ref Range Status  . Sodium 10/23/2015 138  135 - 146 mmol/L Final  . Potassium 10/23/2015 4.5  3.5 - 5.3 mmol/L Final  . Chloride 10/23/2015 103  98 - 110 mmol/L Final  . CO2 10/23/2015 21  20 - 31 mmol/L Final  . Glucose, Bld 10/23/2015 126* 70 - 99 mg/dL Final  . BUN 10/23/2015 60* 7 - 25 mg/dL Final  . Creat 10/23/2015 2.24* 0.60 - 0.88 mg/dL Final   Comment:   For patients > or = 80 years of age: The upper reference limit for Creatinine is approximately 13% higher for people identified as African-American.     . Calcium 10/23/2015 8.7  8.6 - 10.4 mg/dL Final  . GFR, Est African American 10/23/2015 23* >=60 mL/min Final  . GFR, Est Non African American 10/23/2015 20* >=60 mL/min Final   10/22/15 Renal function was worse, so I had the patient hold amlodipine, I did not increase losartan, and I did increase the lasix as planned.   Unfortunately, we were unable to reach the son with this information. Therefore she doubled her dose of Lasix and increase her losartan. The swelling in her legs is much better. They're still pitting edema around her ankles but not as prevalent as it was earlier this week. Her blood pressure is excellent. In fact is a little on the low side. She denies any chest pain or shortness of breath.  At that time, my plan was: Given her renal function I want him to decrease losartan back to 25 mg a day. Stay off the amlodipine as this is likely contributing to her swelling. Resume her previous dose of Lasix. Recheck her renal function. If worsening, we may need to hold losartan for short period of time. I encouraged the patient to drink 4-5 glasses of water a day  11/13/15 Ultimately, had to add cardura 2 mg poqday for HTN.  Here for follow up.  BP is elevated at home 150-160/80.  Weight is down to 164 lbs.  No residual edema.  Past Medical History:  Diagnosis Date  . Anemia   . Arthritis   . Cancer (HCC)      hx of skin cancer  . CHF (congestive heart failure) ([place])   .  Chronic renal failure, stage 3 (moderate)   . Coronary artery disease   . Diabetes mellitus    insulin dependent  . GERD (gastroesophageal reflux disease)   . Headache(784.0)    rare migraines  . Hypercholesterolemia   . Hypertension   . Myocardial infarction ([place]) 2013  . Osteopenia   . Proliferative retinopathy due to DM ([place])   . PUD (peptic ulcer disease)   . S/P colonoscopy Sept 2011   left-sided diverticula, tubular adenoma  . S/P endoscopy Aug 2011   3 superficial gastric ulcers, NSAID-induced  . Shortness of breath   . SIADH (syndrome of inappropriate ADH production) ([place])    Past Surgical History:  Procedure Laterality Date  . APPENDECTOMY    . CARDIAC CATHETERIZATION  01/22/2013  . cardiac stents  07/19/2011  . COLONOSCOPY  12/04/09   normal rectum/left-sided diverticula  . CORONARY ANGIOPLASTY  07/2011  . CORONARY ARTERY BYPASS GRAFT N/A 11/08/2013   Procedure: CORONARY ARTERY BYPASS GRAFTING (CABG), on pump, times two, using left internal mammary artery, cryo saphenous vein.;  Surgeon: Ivin Poot, MD;  Location: [place];  Service: Open Heart Surgery;  Laterality: N/A;  LIMA-LAD CRYOVEIN -OM  . ESOPHAGOGASTRODUODENOSCOPY  10/16/09   normal without barrett's/three superficial gastric ulcers  . EYE SURGERY     cataracts  . INTRAOPERATIVE TRANSESOPHAGEAL ECHOCARDIOGRAM N/A 11/08/2013   Procedure: INTRAOPERATIVE TRANSESOPHAGEAL ECHOCARDIOGRAM;  Surgeon: Ivin Poot, MD;  Location: [place];  Service: Open Heart Surgery;  Laterality: N/A;  . JOINT REPLACEMENT  arthroscopy to knee  . LEFT HEART CATHETERIZATION WITH CORONARY ANGIOGRAM N/A 07/19/2011   Procedure: LEFT HEART CATHETERIZATION WITH CORONARY ANGIOGRAM;  Surgeon: Laverda Page, MD;  Location: [facility] CATH LAB;  Service: Cardiovascular;  Laterality: N/A;  . LEFT HEART CATHETERIZATION WITH CORONARY ANGIOGRAM N/A 12/21/2011   Procedure: LEFT HEART  CATHETERIZATION WITH CORONARY ANGIOGRAM;  Surgeon: Laverda Page, MD;  Location: [facility] CATH LAB;  Service: Cardiovascular;  Laterality: N/A;  . LEFT HEART CATHETERIZATION WITH CORONARY ANGIOGRAM N/A 02/20/2012   Procedure: LEFT HEART CATHETERIZATION WITH CORONARY ANGIOGRAM;  Surgeon: Laverda Page, MD;  Location: [facility] CATH LAB;  Service: Cardiovascular;  Laterality: N/A;  . LEFT HEART CATHETERIZATION WITH CORONARY ANGIOGRAM N/A 09/03/2012   Procedure: LEFT HEART CATHETERIZATION WITH CORONARY ANGIOGRAM;  Surgeon: Laverda Page, MD;  Location: [facility] CATH LAB;  Service: Cardiovascular;  Laterality: N/A;  . LEFT HEART CATHETERIZATION WITH CORONARY ANGIOGRAM N/A 12/04/2012   Procedure: LEFT HEART CATHETERIZATION WITH CORONARY ANGIOGRAM;  Surgeon: Laverda Page, MD;  Location: [facility] CATH LAB;  Service: Cardiovascular;  Laterality: N/A;  . LEFT HEART CATHETERIZATION WITH CORONARY ANGIOGRAM N/A 01/22/2013   Procedure: LEFT HEART CATHETERIZATION WITH CORONARY ANGIOGRAM;  Surgeon: Laverda Page, MD;  Location: [facility] CATH LAB;  Service: Cardiovascular;  Laterality: N/A;  . LEFT HEART CATHETERIZATION WITH CORONARY ANGIOGRAM N/A 06/26/2013   Procedure: LEFT HEART CATHETERIZATION WITH CORONARY ANGIOGRAM;  Surgeon: Laverda Page, MD;  Location: [facility] CATH LAB;  Service: Cardiovascular;  Laterality: N/A;  . LEFT HEART CATHETERIZATION WITH CORONARY ANGIOGRAM N/A 11/05/2013   Procedure: LEFT HEART CATHETERIZATION WITH CORONARY ANGIOGRAM;  Surgeon: Laverda Page, MD;  Location: [facility] CATH LAB;  Service: Cardiovascular;  Laterality: N/A;  . MITRAL VALVE REPLACEMENT N/A 11/08/2013   Procedure: MITRAL VALVE (MV) REPLACEMENT;  Surgeon: Ivin Poot, MD;  Location: [place];  Service: Open Heart Surgery;  Laterality: N/A;  #25 MAGNA MITRAL EASE  . PERCUTANEOUS CORONARY STENT INTERVENTION (PCI-S)  09/03/2012  Procedure: PERCUTANEOUS CORONARY STENT INTERVENTION (PCI-S);  Surgeon: Laverda Page, MD;  Location: [facility] CATH  LAB;  Service: Cardiovascular;;  . TOE AMPUTATION Left 2013   left second toe amputation  . TONSILLECTOMY     Current Outpatient Prescriptions on File Prior to Visit  Medication Sig Dispense Refill  . ACCU-CHEK FASTCLIX LANCETS MISC Check BS 4x daily due to fluctuating blood sugars - Fasting, before meals and QHS- Dx: E11.65 500 each 3  . amLODipine (NORVASC) 5 MG tablet Take 1 tablet (5 mg total) by mouth daily. (Patient not taking: Reported on 10/22/2015) 90 tablet 3  . ascorbic acid (VITAMIN C) 500 MG tablet Take 1 tablet (500 mg total) by mouth 2 (two) times daily. 60 tablet 11  . aspirin 81 MG chewable tablet Chew 2 tablets (162 mg total) by mouth daily. 60 tablet 11  . B-D ULTRAFINE III SHORT PEN 31G X 8 MM MISC 5 each by Does not apply route 5 (five) times daily. Use to inject insulin 5x daily 500 each 3  . cephALEXin (KEFLEX) 500 MG capsule Take 1 capsule (500 mg total) by mouth 3 (three) times daily. 21 capsule 0  . cetirizine (ZYRTEC) 10 MG tablet Take 1 tablet (10 mg total) by mouth at bedtime. 30 tablet 11  . doxazosin (CARDURA) 2 MG tablet Take 1 tablet (2 mg total) by mouth daily. 90 tablet 3  . feeding supplement, GLUCERNA SHAKE, (GLUCERNA SHAKE) LIQD Take 237 mLs by mouth 3 (three) times daily with meals.  0  . ferrous sulfate 325 (65 FE) MG tablet Take 1 tablet (325 mg total) by mouth 2 (two) times daily with a meal. 60 tablet 11  . fluticasone (FLONASE) 50 MCG/ACT nasal spray Place 1 spray into both nostrils daily. 48 g 3  . glucose blood (ACCU-CHEK SMARTVIEW) test strip Check BS 4x daily due to fluctuating blood sugars - Fasting, before meals and QHS- Dx: E11.65 500 each 3  . HYDROmorphone (DILAUDID) 2 MG tablet Take 1 tablet (2 mg total) by mouth every 4 (four) hours as needed for severe pain. 30 tablet 0  . insulin aspart (NOVOLOG FLEXPEN) 100 UNIT/ML FlexPen 70-120=0    251-300=5 121-150=1  301-350=7 151-200=2  351-400=9 201-250=3  400 or >, call MD  units of insulin 45 mL 3    . Insulin Glargine (LANTUS SOLOSTAR) 100 UNIT/ML Solostar Pen INJECT 20 UNITS SUBCUTANEOUSLY TWICE DAILY (Patient taking differently: Inject 20 Units into the skin daily. INJECT 20 UNITS SUBCUTANEOUSLY TWICE DAILY) 45 mL 3  . losartan (COZAAR) 25 MG tablet 2 tab po qd  2  . metoprolol tartrate (LOPRESSOR) 25 MG tablet TAKE 1 TABLET TWICE DAILY 180 tablet 1  . nitroGLYCERIN (NITROSTAT) 0.4 MG SL tablet Place 1 tablet (0.4 mg total) under the tongue every 5 (five) minutes as needed for chest pain. 25 tablet 2  . ondansetron (ZOFRAN) 4 MG tablet Take 4 mg by mouth every 8 (eight) hours as needed for nausea or vomiting.    . pantoprazole (PROTONIX) 40 MG tablet TAKE 1 TABLET(40 MG) BY MOUTH DAILY 90 tablet 1  . simvastatin (ZOCOR) 20 MG tablet TAKE 1 TABLET(20 MG) BY MOUTH DAILY 90 tablet 1  . torsemide (DEMADEX) 20 MG tablet Take 1 tablet (20 mg total) by mouth 2 (two) times daily. (Patient taking differently: Take 20 mg by mouth 2 (two) times daily. M/W/F pt takes BIDother days QD) 180 tablet 1  . traZODone (DESYREL) 50 MG tablet TAKE 1 TABLET(50 MG)  BY MOUTH AT BEDTIME 90 tablet 1   No current facility-administered medications on file prior to visit.    Allergies  Allergen Reactions  . Aspirin Other (See Comments)    High doses caused stomach bleeds   Social History   Social History  . Marital status: Widowed    Spouse name: N/A  . Number of children: N/A  . Years of education: N/A   Occupational History  . Not on file.   Social History Main Topics  . Smoking status: Never Smoker  . Smokeless tobacco: Never Used  . Alcohol use No  . Drug use: No  . Sexual activity: No   Other Topics Concern  . Not on file   Social History Narrative  . No narrative on file   No family history on file.    Review of Systems  All other systems reviewed and are negative.      Objective:   Physical Exam  Constitutional: She is oriented to person, place, and time. She appears  well-developed and well-nourished. No distress.  HENT:  Head: Normocephalic and atraumatic.  Right Ear: External ear normal.  Left Ear: External ear normal.  Nose: Nose normal.  Mouth/Throat: Oropharynx is clear and moist.  Eyes: Conjunctivae and EOM are normal. Pupils are equal, round, and reactive to light. Right eye exhibits no discharge. Left eye exhibits no discharge. No scleral icterus.  Neck: Normal range of motion. Neck supple. No JVD present. No tracheal deviation present. No thyromegaly present.  Cardiovascular: Normal rate, regular rhythm, normal heart sounds and intact distal pulses.  Exam reveals no gallop and no friction rub.   No murmur heard. Pulmonary/Chest: Effort normal and breath sounds normal. No stridor. No respiratory distress. She has no wheezes. She has no rales. She exhibits no tenderness.  Abdominal: Soft. Bowel sounds are normal. She exhibits no distension and no mass. There is no tenderness. There is no rebound and no guarding.  Musculoskeletal: She exhibits no edema.  Lymphadenopathy:    She has no cervical adenopathy.  Neurological: She is alert and oriented to person, place, and time. She has normal reflexes. No cranial nerve deficit. She exhibits normal muscle tone. Coordination normal.  Skin: Skin is warm. No rash noted. She is not diaphoretic. No erythema. No pallor.  Psychiatric: She has a normal mood and affect. Her behavior is normal. Judgment and thought content normal.  Vitals reviewed.  Wt Readings from Last 3 Encounters:  10/22/15 168 lb (76.2 kg)  10/15/15 169 lb (76.7 kg)  09/23/15 166 lb (75.3 kg)         Assessment & Plan:  Benign essential HTN - Plan: BASIC METABOLIC PANEL WITH GFR  Up cardura to 4 mg a day and recheck bp in 2 weeks.  Repeat bmp and if renal fcn is declining, may need to reduce torsemide due to weight loss and dehydration.

## 2015-11-13 NOTE — Addendum Note (Signed)
Addended by: Shary Decamp B on: 11/13/2015 11:09 AM   Modules accepted: Orders

## 2015-11-14 LAB — BASIC METABOLIC PANEL WITH GFR
BUN: 50 mg/dL — AB (ref 7–25)
CHLORIDE: 106 mmol/L (ref 98–110)
CO2: 21 mmol/L (ref 20–31)
CREATININE: 2.1 mg/dL — AB (ref 0.60–0.88)
Calcium: 8.8 mg/dL (ref 8.6–10.4)
GFR, Est African American: 25 mL/min — ABNORMAL LOW (ref >=60)
GFR, Est Non African American: 22 mL/min — ABNORMAL LOW (ref >=60)
Glucose, Bld: 154 mg/dL — ABNORMAL HIGH (ref 70–99)
POTASSIUM: 4.2 mmol/L (ref 3.5–5.3)
Sodium: 140 mmol/L (ref 135–146)

## 2015-11-15 ENCOUNTER — Other Ambulatory Visit: Payer: Self-pay | Admitting: Family Medicine

## 2015-11-16 ENCOUNTER — Ambulatory Visit (INDEPENDENT_AMBULATORY_CARE_PROVIDER_SITE_OTHER): Payer: Commercial Managed Care - HMO | Admitting: Podiatry

## 2015-11-16 ENCOUNTER — Encounter: Payer: Self-pay | Admitting: Podiatry

## 2015-11-16 DIAGNOSIS — M79674 Pain in right toe(s): Secondary | ICD-10-CM

## 2015-11-16 DIAGNOSIS — B351 Tinea unguium: Secondary | ICD-10-CM | POA: Diagnosis not present

## 2015-11-16 DIAGNOSIS — M79675 Pain in left toe(s): Secondary | ICD-10-CM | POA: Diagnosis not present

## 2015-11-16 DIAGNOSIS — E119 Type 2 diabetes mellitus without complications: Secondary | ICD-10-CM

## 2015-11-16 DIAGNOSIS — G4734 Idiopathic sleep related nonobstructive alveolar hypoventilation: Secondary | ICD-10-CM | POA: Diagnosis not present

## 2015-11-16 NOTE — Patient Instructions (Signed)
Diabetes and Foot Care Diabetes may cause you to have problems because of poor blood supply (circulation) to your feet and legs. This may cause the skin on your feet to become thinner, break easier, and heal more slowly. Your skin may become dry, and the skin may peel and crack. You may also have nerve damage in your legs and feet causing decreased feeling in them. You may not notice minor injuries to your feet that could lead to infections or more serious problems. Taking care of your feet is one of the most important things you can do for yourself.  HOME CARE INSTRUCTIONS  Wear shoes at all times, even in the house. Do not go barefoot. Bare feet are easily injured.  Check your feet daily for blisters, cuts, and redness. If you cannot see the bottom of your feet, use a mirror or ask someone for help.  Wash your feet with warm water (do not use hot water) and mild soap. Then pat your feet and the areas between your toes until they are completely dry. Do not soak your feet as this can dry your skin.  Apply a moisturizing lotion or petroleum jelly (that does not contain alcohol and is unscented) to the skin on your feet and to dry, brittle toenails. Do not apply lotion between your toes.  Trim your toenails straight across. Do not dig under them or around the cuticle. File the edges of your nails with an emery board or nail file.  Do not cut corns or calluses or try to remove them with medicine.  Wear clean socks or stockings every day. Make sure they are not too tight. Do not wear knee-high stockings since they may decrease blood flow to your legs.  Wear shoes that fit properly and have enough cushioning. To break in new shoes, wear them for just a few hours a day. This prevents you from injuring your feet. Always look in your shoes before you put them on to be sure there are no objects inside.  Do not cross your legs. This may decrease the blood flow to your feet.  If you find a minor scrape,  cut, or break in the skin on your feet, keep it and the skin around it clean and dry. These areas may be cleansed with mild soap and water. Do not cleanse the area with peroxide, alcohol, or iodine.  When you remove an adhesive bandage, be sure not to damage the skin around it.  If you have a wound, look at it several times a day to make sure it is healing.  Do not use heating pads or hot water bottles. They may burn your skin. If you have lost feeling in your feet or legs, you may not know it is happening until it is too late.  Make sure your health care provider performs a complete foot exam at least annually or more often if you have foot problems. Report any cuts, sores, or bruises to your health care provider immediately. SEEK MEDICAL CARE IF:   You have an injury that is not healing.  You have cuts or breaks in the skin.  You have an ingrown nail.  You notice redness on your legs or feet.  You feel burning or tingling in your legs or feet.  You have pain or cramps in your legs and feet.  Your legs or feet are numb.  Your feet always feel cold. SEEK IMMEDIATE MEDICAL CARE IF:   There is increasing redness,   swelling, or pain in or around a wound.  There is a red line that goes up your leg.  Pus is coming from a wound.  You develop a fever or as directed by your health care provider.  You notice a bad smell coming from an ulcer or wound.   This information is not intended to replace advice given to you by your health care provider. Make sure you discuss any questions you have with your health care provider.   Document Released: 02/19/2000 Document Revised: 10/24/2012 Document Reviewed: 07/31/2012 Elsevier Interactive Patient Education 2016 [employer]  

## 2015-11-16 NOTE — Progress Notes (Signed)
   Subjective:    Patient ID: Claudia [name], female    DOB: Dec 25, 1931, 82 y.o.   MRN: [id number]  HPI  82 year old female presents the office they for concerns of thick, painful, elongated toenails that she cannot trim herself. She states that her son was present during the toenails but due to the thickness he is unable to do so. She denies any redness or drainage coming from the toenails.  Review of Systems  Cardiovascular:       HEART ATTACK       Objective:   Physical Exam General: AAO x3, NAD  Dermatological: Nails are hypertrophic, dystrophic, brittle, discolored, elongated . No surrounding redness or drainage. Tenderness nails 1-5 bilaterally except for the left 2nd. No open lesions or pre-ulcerative lesions are identified today.   Vascular: Dorsalis Pedis artery and Posterior Tibial artery pedal pulses are palpable bilateral with immedate capillary fill time mild chronic bilateral lower extremity edema. No ecchymosis or erythema or warmth or any weeping legs.. There is no pain with calf compression, warmth, erythema.   Neruologic: Grossly intact via light touch bilateral. Vibratory intact via tuning fork bilateral. Protective threshold with Semmes Wienstein monofilament intact to all pedal sites bilateral.   Musculoskeletal: . No pain, crepitus, or limitation noted with foot and ankle range of motion bilateral. Muscular strength 5/5 in all groups tested bilateral.      Assessment & Plan:  82 year old female symptomatic onychomycosis -Treatment options discussed including all alternatives, risks, and complications -Etiology of symptoms were discussed -Nails debrided 10 without complications or bleeding. -Daily foot inspection -Follow-up in 3 months or sooner if any problems arise. In the meantime, encouraged to call the office with any questions, concerns, change in symptoms.   Celesta Gentile, DPM

## 2015-11-25 ENCOUNTER — Telehealth: Payer: Self-pay | Admitting: Family Medicine

## 2015-11-25 NOTE — Telephone Encounter (Signed)
Claudia [name] called and states that BP on new medications is averaging 153/65 and the highest it has been is 158/70.

## 2015-11-26 NOTE — Telephone Encounter (Signed)
For her age, that is pretty good.

## 2015-11-27 MED ORDER — DOXAZOSIN MESYLATE 4 MG PO TABS: 4.0000 mg | ORAL_TABLET | Freq: Every day | ORAL | 3 refills | Status: DC

## 2015-11-27 NOTE — Telephone Encounter (Signed)
pts son aware and med sent to [facility]

## 2015-12-10 DIAGNOSIS — R600 Localized edema: Secondary | ICD-10-CM | POA: Diagnosis not present

## 2015-12-10 DIAGNOSIS — I2510 Atherosclerotic heart disease of native coronary artery without angina pectoris: Secondary | ICD-10-CM | POA: Diagnosis not present

## 2015-12-10 DIAGNOSIS — E1122 Type 2 diabetes mellitus with diabetic chronic kidney disease: Secondary | ICD-10-CM | POA: Diagnosis not present

## 2015-12-10 DIAGNOSIS — I5032 Chronic diastolic (congestive) heart failure: Secondary | ICD-10-CM | POA: Diagnosis not present

## 2015-12-14 ENCOUNTER — Encounter (INDEPENDENT_AMBULATORY_CARE_PROVIDER_SITE_OTHER): Payer: Commercial Managed Care - HMO | Admitting: Ophthalmology

## 2015-12-14 DIAGNOSIS — H35372 Puckering of macula, left eye: Secondary | ICD-10-CM | POA: Diagnosis not present

## 2015-12-14 DIAGNOSIS — I10 Essential (primary) hypertension: Secondary | ICD-10-CM | POA: Diagnosis not present

## 2015-12-14 DIAGNOSIS — E11311 Type 2 diabetes mellitus with unspecified diabetic retinopathy with macular edema: Secondary | ICD-10-CM

## 2015-12-14 DIAGNOSIS — E113512 Type 2 diabetes mellitus with proliferative diabetic retinopathy with macular edema, left eye: Secondary | ICD-10-CM | POA: Diagnosis not present

## 2015-12-14 DIAGNOSIS — H43813 Vitreous degeneration, bilateral: Secondary | ICD-10-CM | POA: Diagnosis not present

## 2015-12-14 DIAGNOSIS — E113591 Type 2 diabetes mellitus with proliferative diabetic retinopathy without macular edema, right eye: Secondary | ICD-10-CM

## 2015-12-14 DIAGNOSIS — H35033 Hypertensive retinopathy, bilateral: Secondary | ICD-10-CM | POA: Diagnosis not present

## 2015-12-14 LAB — HM DIABETES EYE EXAM

## 2015-12-16 DIAGNOSIS — G4734 Idiopathic sleep related nonobstructive alveolar hypoventilation: Secondary | ICD-10-CM | POA: Diagnosis not present

## 2015-12-20 ENCOUNTER — Other Ambulatory Visit: Payer: Self-pay | Admitting: Family Medicine

## 2015-12-23 ENCOUNTER — Encounter: Payer: Self-pay | Admitting: Family Medicine

## 2015-12-25 ENCOUNTER — Emergency Department (HOSPITAL_COMMUNITY)
Admission: EM | Admit: 2015-12-25 | Discharge: 2015-12-25 | Disposition: A | Discharge status: Home / Self Care | Payer: Commercial Managed Care - HMO | Attending: Emergency Medicine | Admitting: Emergency Medicine

## 2015-12-25 ENCOUNTER — Emergency Department (HOSPITAL_COMMUNITY): Payer: Commercial Managed Care - HMO

## 2015-12-25 ENCOUNTER — Encounter (HOSPITAL_COMMUNITY): Payer: Self-pay | Admitting: Emergency Medicine

## 2015-12-25 ENCOUNTER — Telehealth: Payer: Self-pay | Admitting: Family Medicine

## 2015-12-25 DIAGNOSIS — Y999 Unspecified external cause status: Secondary | ICD-10-CM | POA: Insufficient documentation

## 2015-12-25 DIAGNOSIS — Y92009 Unspecified place in unspecified non-institutional (private) residence as the place of occurrence of the external cause: Secondary | ICD-10-CM

## 2015-12-25 DIAGNOSIS — Z7984 Long term (current) use of oral hypoglycemic drugs: Secondary | ICD-10-CM | POA: Insufficient documentation

## 2015-12-25 DIAGNOSIS — W19XXXA Unspecified fall, initial encounter: Secondary | ICD-10-CM

## 2015-12-25 DIAGNOSIS — Z7982 Long term (current) use of aspirin: Secondary | ICD-10-CM | POA: Insufficient documentation

## 2015-12-25 DIAGNOSIS — N184 Chronic kidney disease, stage 4 (severe): Secondary | ICD-10-CM | POA: Diagnosis not present

## 2015-12-25 DIAGNOSIS — S42352A Displaced comminuted fracture of shaft of humerus, left arm, initial encounter for closed fracture: Secondary | ICD-10-CM | POA: Diagnosis not present

## 2015-12-25 DIAGNOSIS — S42292A Other displaced fracture of upper end of left humerus, initial encounter for closed fracture: Secondary | ICD-10-CM | POA: Insufficient documentation

## 2015-12-25 DIAGNOSIS — Y939 Activity, unspecified: Secondary | ICD-10-CM | POA: Insufficient documentation

## 2015-12-25 DIAGNOSIS — E119 Type 2 diabetes mellitus without complications: Secondary | ICD-10-CM | POA: Diagnosis not present

## 2015-12-25 DIAGNOSIS — I130 Hypertensive heart and chronic kidney disease with heart failure and stage 1 through stage 4 chronic kidney disease, or unspecified chronic kidney disease: Secondary | ICD-10-CM | POA: Diagnosis not present

## 2015-12-25 DIAGNOSIS — I509 Heart failure, unspecified: Secondary | ICD-10-CM | POA: Diagnosis not present

## 2015-12-25 DIAGNOSIS — I2510 Atherosclerotic heart disease of native coronary artery without angina pectoris: Secondary | ICD-10-CM | POA: Diagnosis not present

## 2015-12-25 DIAGNOSIS — R52 Pain, unspecified: Secondary | ICD-10-CM | POA: Diagnosis not present

## 2015-12-25 DIAGNOSIS — Y929 Unspecified place or not applicable: Secondary | ICD-10-CM | POA: Insufficient documentation

## 2015-12-25 DIAGNOSIS — S42302D Unspecified fracture of shaft of humerus, left arm, subsequent encounter for fracture with routine healing: Secondary | ICD-10-CM

## 2015-12-25 DIAGNOSIS — W1839XA Other fall on same level, initial encounter: Secondary | ICD-10-CM | POA: Diagnosis not present

## 2015-12-25 DIAGNOSIS — M79603 Pain in arm, unspecified: Secondary | ICD-10-CM | POA: Diagnosis not present

## 2015-12-25 DIAGNOSIS — S4992XA Unspecified injury of left shoulder and upper arm, initial encounter: Secondary | ICD-10-CM | POA: Diagnosis present

## 2015-12-25 DIAGNOSIS — S42202A Unspecified fracture of upper end of left humerus, initial encounter for closed fracture: Secondary | ICD-10-CM | POA: Diagnosis not present

## 2015-12-25 DIAGNOSIS — S42302A Unspecified fracture of shaft of humerus, left arm, initial encounter for closed fracture: Secondary | ICD-10-CM

## 2015-12-25 MED ORDER — METHOCARBAMOL 500 MG PO TABS: ORAL_TABLET | ORAL | 0 refills | Status: DC

## 2015-12-25 MED ORDER — OXYCODONE-ACETAMINOPHEN 5-325 MG PO TABS: 1.0000 | ORAL_TABLET | Freq: Four times a day (QID) | ORAL | 0 refills | Status: DC | PRN

## 2015-12-25 MED ORDER — OXYCODONE-ACETAMINOPHEN 5-325 MG PO TABS
1.0000 | ORAL_TABLET | Freq: Once | ORAL | Status: AC
  Administered 2015-12-25: 1 via ORAL
  Filled 2015-12-25: qty 1

## 2015-12-25 NOTE — ED Triage Notes (Addendum)
Pt fell tonight, states unsteady gait normally, fell back and hit her left arm. Per EMS some deformity noted. Denies hitting her head or LOC; is on blood thinner. 4mg  Morphine PTA.

## 2015-12-25 NOTE — Telephone Encounter (Signed)
Pt fell at home last night and fractured left Humerus.  Seen and treated at [facility] ED.  Needs [employer] referral to see Dr Aline Brochure for fracture care.  Ref placed for Dr Aline Brochure and approved # [id number] for 6 visits from10/20/17 - 06/22/16 Dx [id number]

## 2015-12-25 NOTE — Discharge Instructions (Signed)
Ice packs over the area for comfort. Take the percocet for pain. Wear the sling to support the splint. Call Dr Ruthe Mannan office today to get an appointment. You have a "midshaft fracture of your left humerus".  Return to the ED if you get pain, swelling or numbness in your fingers or you get new symptoms or injuries you weren't aware of tonight.

## 2015-12-25 NOTE — ED Notes (Signed)
Pt verbalized understanding of discharge instructions.Pt wheeled to car in wheelchair.

## 2015-12-25 NOTE — Telephone Encounter (Signed)
asolutely

## 2015-12-25 NOTE — ED Provider Notes (Signed)
[place] DEPT Provider Note   CSN: [id number] Arrival date & time: 12/25/15  0220  Time seen 02:26 AM   History   Chief Complaint Chief Complaint  Patient presents with  . Fall    Arm injury    HPI Claudia [name] is a 82 y.o. female.  HPI patient reports she was in the living room and all the sudden she started stumbling backward and fell onto a small table. She states her shoulder went between the legs of the table. She complains of pain in her left upper arm just  below the area of the shoulder. She denies any numbness of her fingers. She states any movement of her arm causes pain in that same area. She denies any elbow or wrist pain in the same arm. She denies hitting her head or loss of consciousness. She has no chest pain, abdominal pain, or other extremity pain. Patient states she is right handed.  EMS has already given her morphine 4 mg IV.  Patient states sometimes her balance is not great. She states when she had open-heart surgery she had had a stroke he sometimes she has to use a wheelchair and other times she is able to ambulate without it.  PCP Dr Dennard Schaumann  Past Medical History:  Diagnosis Date  . Anemia   . Arthritis   . Cancer (HCC)    hx of skin cancer  . CHF (congestive heart failure) ([place])   . Chronic renal failure, stage 3 (moderate)   . Coronary artery disease   . Diabetes mellitus    insulin dependent  . GERD (gastroesophageal reflux disease)   . Headache(784.0)    rare migraines  . Hypercholesterolemia   . Hypertension   . Myocardial infarction 2013  . Osteopenia   . Proliferative retinopathy due to DM ([place])   . PUD (peptic ulcer disease)   . S/P colonoscopy Sept 2011   left-sided diverticula, tubular adenoma  . S/P endoscopy Aug 2011   3 superficial gastric ulcers, NSAID-induced  . Shortness of breath   . SIADH (syndrome of inappropriate ADH production) [facility])     Patient Active Problem List   Diagnosis Date Noted  .  Atherosclerosis of aorta (Home) 02/24/2015  . Infection with ESBL Klebsiella oxytoca 06/20/2014  . Acute encephalopathy 06/18/2014  . Elevated troponin 06/17/2014  . UTI (lower urinary tract infection) 06/17/2014  . Altered mental status 02/24/2014  . Aphasia   . Urinary tract infectious disease   . UTI (urinary tract infection) 02/23/2014  . CKD (chronic kidney disease) stage 4, GFR 15-29 ml/min (HCC) 12/24/2013  . Fluid overload 12/24/2013  . Volume overload 12/23/2013  . S/P mitral valve replacement with bioprosthetic valve 12/09/2013  . Atrial fibrillation ([place]) 12/09/2013  . TIA (transient ischemic attack) 12/01/2013  . S/P CABG x 2 11/08/2013  . Unstable angina pectoris ([place]) 11/05/2013  . ACS (acute coronary syndrome) ([place]) 06/26/2013  . NSTEMI (non-ST elevated myocardial infarction) ([place]) 06/26/2013  . CHF (congestive heart failure) ([place]) 06/25/2012  . Hypercholesterolemia   . Osteopenia   . Unstable angina ([place]) 12/21/2011  . Osteomyelitis of toe ([place]) 10/26/2011  . ARF (acute renal failure) ([place]) 10/26/2011  . Hyperkalemia 10/26/2011  . Diarrhea 07/29/2011  . Hyponatremia 07/28/2011  . Nausea and vomiting 07/28/2011  . Benign hypertension 07/28/2011  . DM (diabetes mellitus), type 2 ([place]) 07/28/2011  . CAD (coronary artery disease), native coronary artery 07/20/2011  . GERD 12/28/2009  . PUD, HX OF 12/28/2009  .  COLONIC POLYPS, ADENOMATOUS, HX OF 12/28/2009  . ANEMIA 10/22/2009    Past Surgical History:  Procedure Laterality Date  . APPENDECTOMY    . CARDIAC CATHETERIZATION  01/22/2013  . cardiac stents  07/19/2011  . COLONOSCOPY  12/04/09   normal rectum/left-sided diverticula  . CORONARY ANGIOPLASTY  07/2011  . CORONARY ARTERY BYPASS GRAFT N/A 11/08/2013   Procedure: CORONARY ARTERY BYPASS GRAFTING (CABG), on pump, times two, using left internal mammary artery, cryo saphenous vein.;  Surgeon: Ivin Poot, MD;  Location: [place];  Service: Open Heart Surgery;   Laterality: N/A;  LIMA-LAD CRYOVEIN -OM  . ESOPHAGOGASTRODUODENOSCOPY  10/16/09   normal without barrett's/three superficial gastric ulcers  . EYE SURGERY     cataracts  . INTRAOPERATIVE TRANSESOPHAGEAL ECHOCARDIOGRAM N/A 11/08/2013   Procedure: INTRAOPERATIVE TRANSESOPHAGEAL ECHOCARDIOGRAM;  Surgeon: Ivin Poot, MD;  Location: [place];  Service: Open Heart Surgery;  Laterality: N/A;  . JOINT REPLACEMENT  arthroscopy to knee  . LEFT HEART CATHETERIZATION WITH CORONARY ANGIOGRAM N/A 07/19/2011   Procedure: LEFT HEART CATHETERIZATION WITH CORONARY ANGIOGRAM;  Surgeon: Laverda Page, MD;  Location[facility] CATH LAB;  Service: Cardiovascular;  Laterality: N/A;  . LEFT HEART CATHETERIZATION WITH CORONARY ANGIOGRAM N/A 12/21/2011   Procedure: LEFT HEART CATHETERIZATION WITH CORONARY ANGIOGRAM;  Surgeon: Laverda Page, MD;  Location: [facility] CATH LAB;  Service: Cardiovascular;  Laterality: N/A;  . LEFT HEART CATHETERIZATION WITH CORONARY ANGIOGRAM N/A 02/20/2012   Procedure: LEFT HEART CATHETERIZATION WITH CORONARY ANGIOGRAM;  Surgeon: Laverda Page, MD;  Location: [facility] CATH LAB;  Service: Cardiovascular;  Laterality: N/A;  . LEFT HEART CATHETERIZATION WITH CORONARY ANGIOGRAM N/A 09/03/2012   Procedure: LEFT HEART CATHETERIZATION WITH CORONARY ANGIOGRAM;  Surgeon: Laverda Page, MD;  Location: [facility] CATH LAB;  Service: Cardiovascular;  Laterality: N/A;  . LEFT HEART CATHETERIZATION WITH CORONARY ANGIOGRAM N/A 12/04/2012   Procedure: LEFT HEART CATHETERIZATION WITH CORONARY ANGIOGRAM;  Surgeon: Laverda Page, MD;  Location: [facility] CATH LAB;  Service: Cardiovascular;  Laterality: N/A;  . LEFT HEART CATHETERIZATION WITH CORONARY ANGIOGRAM N/A 01/22/2013   Procedure: LEFT HEART CATHETERIZATION WITH CORONARY ANGIOGRAM;  Surgeon: Laverda Page, MD;  Location: [facility] CATH LAB;  Service: Cardiovascular;  Laterality: N/A;  . LEFT HEART CATHETERIZATION WITH CORONARY ANGIOGRAM N/A 06/26/2013   Procedure: LEFT HEART  CATHETERIZATION WITH CORONARY ANGIOGRAM;  Surgeon: Laverda Page, MD;  Location: [facility] CATH LAB;  Service: Cardiovascular;  Laterality: N/A;  . LEFT HEART CATHETERIZATION WITH CORONARY ANGIOGRAM N/A 11/05/2013   Procedure: LEFT HEART CATHETERIZATION WITH CORONARY ANGIOGRAM;  Surgeon: Laverda Page, MD;  Location: [facility] CATH LAB;  Service: Cardiovascular;  Laterality: N/A;  . MITRAL VALVE REPLACEMENT N/A 11/08/2013   Procedure: MITRAL VALVE (MV) REPLACEMENT;  Surgeon: Ivin Poot, MD;  Location: [place];  Service: Open Heart Surgery;  Laterality: N/A;  #25 MAGNA MITRAL EASE  . PERCUTANEOUS CORONARY STENT INTERVENTION (PCI-S)  09/03/2012   Procedure: PERCUTANEOUS CORONARY STENT INTERVENTION (PCI-S);  Surgeon: Laverda Page, MD;  Location: [facility] CATH LAB;  Service: Cardiovascular;;  . TOE AMPUTATION Left 2013   left second toe amputation  . TONSILLECTOMY      OB History    No data available       Home Medications    Prior to Admission medications   Medication Sig Start Date End Date Taking? Authorizing Provider  ACCU-CHEK FASTCLIX LANCETS MISC Check BS 4x daily due to fluctuating blood sugars - Fasting, before meals and QHS- Dx: E11.65 04/16/15  Susy Frizzle, MD  ascorbic acid (VITAMIN C) 500 MG tablet Take 1 tablet (500 mg total) by mouth 2 (two) times daily. 06/04/14   Susy Frizzle, MD  aspirin 81 MG chewable tablet Chew 2 tablets (162 mg total) by mouth daily. 06/04/14   Susy Frizzle, MD  B-D ULTRAFINE III SHORT PEN 31G X 8 MM MISC 5 each by Does not apply route 5 (five) times daily. Use to inject insulin 5x daily 04/16/15   Susy Frizzle, MD  cetirizine (ZYRTEC) 10 MG tablet Take 1 tablet (10 mg total) by mouth at bedtime. 06/04/14   Susy Frizzle, MD  doxazosin (CARDURA) 4 MG tablet Take 1 tablet (4 mg total) by mouth daily. 11/27/15   Susy Frizzle, MD  feeding supplement, GLUCERNA SHAKE, (GLUCERNA SHAKE) LIQD Take 237 mLs by mouth 3 (three) times daily with meals.  12/13/13   Donielle Liston Alba, PA-C  ferrous sulfate 325 (65 FE) MG tablet Take 1 tablet (325 mg total) by mouth 2 (two) times daily with a meal. 06/04/14   Susy Frizzle, MD  fluticasone (FLONASE) 50 MCG/ACT nasal spray Place 1 spray into both nostrils daily. 04/16/15   Susy Frizzle, MD  glucose blood (ACCU-CHEK SMARTVIEW) test strip Check BS 4x daily due to fluctuating blood sugars - Fasting, before meals and QHS- Dx: E11.65 04/16/15   Susy Frizzle, MD  HYDROmorphone (DILAUDID) 2 MG tablet Take 1 tablet (2 mg total) by mouth every 4 (four) hours as needed for severe pain. 06/26/15   Susy Frizzle, MD  insulin aspart (NOVOLOG FLEXPEN) 100 UNIT/ML FlexPen 70-120=0    251-300=5 121-150=1  301-350=7 151-200=2  351-400=9 201-250=3  400 or >, call MD  units of insulin 04/16/15   Susy Frizzle, MD  Insulin Glargine (LANTUS SOLOSTAR) 100 UNIT/ML Solostar Pen INJECT 20 UNITS SUBCUTANEOUSLY TWICE DAILY Patient taking differently: Inject 20 Units into the skin daily. INJECT 20 UNITS SUBCUTANEOUSLY TWICE DAILY 04/16/15   Susy Frizzle, MD  losartan (COZAAR) 25 MG tablet 2 tab po qd 03/19/15   Historical Provider, MD  methocarbamol (ROBAXIN) 500 MG tablet Take 1 po q8hrs prn muscle spasms 12/25/15   Rolland Porter, MD  metoprolol tartrate (LOPRESSOR) 25 MG tablet TAKE 1 TABLET TWICE DAILY 10/29/15   Susy Frizzle, MD  nitroGLYCERIN (NITROSTAT) 0.4 MG SL tablet Place 1 tablet (0.4 mg total) under the tongue every 5 (five) minutes as needed for chest pain. 06/27/14   Susy Frizzle, MD  ondansetron (ZOFRAN) 4 MG tablet Take 4 mg by mouth every 8 (eight) hours as needed for nausea or vomiting.    Historical Provider, MD  oxyCODONE-acetaminophen (PERCOCET/ROXICET) 5-325 MG tablet Take 1 tablet by mouth every 6 (six) hours as needed for severe pain. 12/25/15   Rolland Porter, MD  pantoprazole (PROTONIX) 40 MG tablet TAKE 1 TABLET(40 MG) BY MOUTH DAILY 04/16/15   Susy Frizzle, MD  simvastatin (ZOCOR) 20 MG tablet  TAKE 1 TABLET(20 MG) BY MOUTH DAILY 04/16/15   Susy Frizzle, MD  torsemide (DEMADEX) 20 MG tablet TAKE 1 TABLET TWICE DAILY 11/16/15   Susy Frizzle, MD  traZODone (DESYREL) 50 MG tablet TAKE 1 TABLET AT BEDTIME 12/21/15   Susy Frizzle, MD    Family History History reviewed. No pertinent family history.  Social History Social History  Substance Use Topics  . Smoking status: Never Smoker  . Smokeless tobacco: Never Used  . Alcohol use No  lives at home Lives with son who is handicapped   Allergies   Aspirin   Review of Systems Review of Systems  All other systems reviewed and are negative.    Physical Exam Updated Vital Signs BP 168/60 (BP Location: Right Arm)   Pulse 60   Temp 97.8 F (36.6 C) (Oral)   Resp 20   Ht 5\' 7"  (1.702 m)   Wt 169 lb (76.7 kg)   SpO2 100%   BMI 26.47 kg/m   Vital signs normal except for hypertension   Physical Exam  Constitutional: She is oriented to person, place, and time.  Non-toxic appearance. She does not appear ill. No distress.  Pleasant elderly frail female  HENT:  Head: Normocephalic and atraumatic.  Right Ear: External ear normal.  Left Ear: External ear normal.  Nose: Nose normal. No mucosal edema or rhinorrhea.  Mouth/Throat: Oropharynx is clear and moist and mucous membranes are normal. No dental abscesses or uvula swelling.  Eyes: Conjunctivae and EOM are normal. Pupils are equal, round, and reactive to light.  Neck: Normal range of motion and full passive range of motion without pain. Neck supple.  Cardiovascular: Normal rate, regular rhythm and normal heart sounds.  Exam reveals no gallop and no friction rub.   No murmur heard. Pulmonary/Chest: Effort normal and breath sounds normal. No respiratory distress. She has no wheezes. She has no rhonchi. She has no rales. She exhibits no tenderness and no crepitus.  Abdominal: Soft. Normal appearance and bowel sounds are normal. She exhibits no distension. There is  no tenderness. There is no rebound and no guarding.  Musculoskeletal: Normal range of motion. She exhibits tenderness and deformity. She exhibits no edema.  Moves all extremities well except for left upper extremity. She has some tenderness in the proximal left humerus with some deformity. She has no pain to palpation of her elbow or elbow pain with range of motion. She has no pain in her left wrist or forearm. She has no pain on range of motion of her lower extremities or if her right upper arm.  Neurological: She is alert and oriented to person, place, and time. She has normal strength. No cranial nerve deficit.  Skin: Skin is warm, dry and intact. No rash noted. No erythema. No pallor.  Psychiatric: She has a normal mood and affect. Her speech is normal and behavior is normal. Her mood appears not anxious.  Nursing note and vitals reviewed.    ED Treatments / Results  Labs (all labs ordered are listed, but only abnormal results are displayed) Labs Reviewed - No data to display  EKG  EKG Interpretation None       Radiology Dg Shoulder Left  Result Date: 12/25/2015 CLINICAL DATA:  Patient fell and hit left arm on unknown object. EXAM: LEFT SHOULDER - 2+ VIEW COMPARISON:  05/30/2014 FINDINGS: There is an acute, closed, comminuted fracture of the proximal diaphysis of the left humerus without intra-articular involvement. There is lateral angulation of the distal fracture fragment. Vascular calcifications are noted along the left axilla and proximal left arm. Small ossific densities in the soft tissues of interposed between the humeral head and acromion may reflect rotator cuff tendinopathy. The glenohumeral and AC joints are maintained. The patient is status post mitral valvular replacement and CABG. Subsegmental atelectasis is seen in the left mid lung. No acute osseous abnormality of the visualized ribs. IMPRESSION: Acute closed angulated comminuted fracture of the proximal diaphysis of  the left humerus. Electronically Signed  By: Ashley Royalty M.D.   On: 12/25/2015 04:20    Procedures Procedures (including critical care time)  Medications Ordered in ED Medications  oxyCODONE-acetaminophen (PERCOCET/ROXICET) 5-325 MG per tablet 1 tablet (1 tablet Oral Given 12/25/15 0345)     Initial Impression / Assessment and Plan / ED Course  I have reviewed the triage vital signs and the nursing notes.  Pertinent labs & imaging results that were available during my care of the patient were reviewed by me and considered in my medical decision making (see chart for details).  Clinical Course   Patient was sent to radiology to get x-rays of her left shoulder.  After reviewing her x-rays she was placed in a long-arm splint and a shoulder immobilizer.  4:28 AM her x-rays have been read by radiology. She was discharged home with Percocet for pain and Robaxin for muscle spasms. She was given referral to Dr. Aline Brochure, orthopedist. She was advised on things to return to the ED such as pain or swelling or numbness in her fingers.  Review of the [place] shows no entries in the past 6 months.   Final Clinical Impressions(s) / ED Diagnoses   Final diagnoses:  Fall in home, initial encounter  Closed fracture of shaft of left humerus, unspecified fracture morphology, initial encounter    New Prescriptions New Prescriptions   METHOCARBAMOL (ROBAXIN) 500 MG TABLET    Take 1 po q8hrs prn muscle spasms   OXYCODONE-ACETAMINOPHEN (PERCOCET/ROXICET) 5-325 MG TABLET    Take 1 tablet by mouth every 6 (six) hours as needed for severe pain.    Plan discharge  Rolland Porter, MD, Barbette Or, MD 12/25/15 [phone]

## 2015-12-28 ENCOUNTER — Ambulatory Visit (INDEPENDENT_AMBULATORY_CARE_PROVIDER_SITE_OTHER): Payer: Commercial Managed Care - HMO | Admitting: Orthopedic Surgery

## 2015-12-28 ENCOUNTER — Encounter: Payer: Self-pay | Admitting: Orthopedic Surgery

## 2015-12-28 DIAGNOSIS — S42335A Nondisplaced oblique fracture of shaft of humerus, left arm, initial encounter for closed fracture: Secondary | ICD-10-CM

## 2015-12-28 NOTE — Progress Notes (Signed)
Patient ID: Claudia [name], female   DOB: February 10, 1932, 82 y.o.   MRN: [id number]  Chief Complaint  Patient presents with  . Shoulder Injury    left humerus fracture, DOI 12/24/15    HPI Claudia [name] is a 82 y.o. female.  82 year old female fell at home and she leaned over. She does have some balance issues. HPI She sustained a fracture of her left humerus with extension proximally but basically the sub-midshaft humerus fracture. She complains of pain over the fracture site which is dull moderate and of 4 days duration. She had a posterior splint applied in the ER. Pain is constant. Worse with movement. Review of Systems Review of Systems  Constitutional: Negative for fever.  Musculoskeletal:       Frequent falls and balance issue  Neurological: Negative for numbness.     Past Medical History:  Diagnosis Date  . Anemia   . Arthritis   . Cancer (HCC)    hx of skin cancer  . CHF (congestive heart failure) ([place])   . Chronic renal failure, stage 3 (moderate)   . Coronary artery disease   . Diabetes mellitus    insulin dependent  . GERD (gastroesophageal reflux disease)   . Headache(784.0)    rare migraines  . Hypercholesterolemia   . Hypertension   . Myocardial infarction 2013  . Osteopenia   . Proliferative retinopathy due to DM ([place])   . PUD (peptic ulcer disease)   . S/P colonoscopy Sept 2011   left-sided diverticula, tubular adenoma  . S/P endoscopy Aug 2011   3 superficial gastric ulcers, NSAID-induced  . Shortness of breath   . SIADH (syndrome of inappropriate ADH production) ([place])     Past Surgical History:  Procedure Laterality Date  . APPENDECTOMY    . CARDIAC CATHETERIZATION  01/22/2013  . cardiac stents  07/19/2011  . COLONOSCOPY  12/04/09   normal rectum/left-sided diverticula  . CORONARY ANGIOPLASTY  07/2011  . CORONARY ARTERY BYPASS GRAFT N/A 11/08/2013   Procedure: CORONARY ARTERY BYPASS GRAFTING (CABG), on pump, times two, using left internal mammary  artery, cryo saphenous vein.;  Surgeon: Ivin Poot, MD;  Location: [place];  Service: Open Heart Surgery;  Laterality: N/A;  LIMA-LAD CRYOVEIN -OM  . ESOPHAGOGASTRODUODENOSCOPY  10/16/09   normal without barrett's/three superficial gastric ulcers  . EYE SURGERY     cataracts  . INTRAOPERATIVE TRANSESOPHAGEAL ECHOCARDIOGRAM N/A 11/08/2013   Procedure: INTRAOPERATIVE TRANSESOPHAGEAL ECHOCARDIOGRAM;  Surgeon: Ivin Poot, MD;  Location: [place];  Service: Open Heart Surgery;  Laterality: N/A;  . JOINT REPLACEMENT  arthroscopy to knee  . LEFT HEART CATHETERIZATION WITH CORONARY ANGIOGRAM N/A 07/19/2011   Procedure: LEFT HEART CATHETERIZATION WITH CORONARY ANGIOGRAM;  Surgeon: Laverda Page, MD;  Location: [facility] CATH LAB;  Service: Cardiovascular;  Laterality: N/A;  . LEFT HEART CATHETERIZATION WITH CORONARY ANGIOGRAM N/A 12/21/2011   Procedure: LEFT HEART CATHETERIZATION WITH CORONARY ANGIOGRAM;  Surgeon: Laverda Page, MD;  Location: [facility] CATH LAB;  Service: Cardiovascular;  Laterality: N/A;  . LEFT HEART CATHETERIZATION WITH CORONARY ANGIOGRAM N/A 02/20/2012   Procedure: LEFT HEART CATHETERIZATION WITH CORONARY ANGIOGRAM;  Surgeon: Laverda Page, MD;  Location: [facility] CATH LAB;  Service: Cardiovascular;  Laterality: N/A;  . LEFT HEART CATHETERIZATION WITH CORONARY ANGIOGRAM N/A 09/03/2012   Procedure: LEFT HEART CATHETERIZATION WITH CORONARY ANGIOGRAM;  Surgeon: Laverda Page, MD;  Location: [facility] C[facility] LAB;  Service: Cardiovascular;  Laterality: N/A;  . LEFT HEART CATHETERIZATION WITH CORONARY ANGIOGRAM N/A  12/04/2012   Procedure: LEFT HEART CATHETERIZATION WITH CORONARY ANGIOGRAM;  Surgeon: Laverda Page, MD;  Location: [facility] CATH LAB;  Service: Cardiovascular;  Laterality: N/A;  . LEFT HEART CATHETERIZATION WITH CORONARY ANGIOGRAM N/A 01/22/2013   Procedure: LEFT HEART CATHETERIZATION WITH CORONARY ANGIOGRAM;  Surgeon: Laverda Page, MD;  Location: [facility] CATH LAB;  Service: Cardiovascular;   Laterality: N/A;  . LEFT HEART CATHETERIZATION WITH CORONARY ANGIOGRAM N/A 06/26/2013   Procedure: LEFT HEART CATHETERIZATION WITH CORONARY ANGIOGRAM;  Surgeon: Laverda Page, MD;  Location: [facility] CATH LAB;  Service: Cardiovascular;  Laterality: N/A;  . LEFT HEART CATHETERIZATION WITH CORONARY ANGIOGRAM N/A 11/05/2013   Procedure: LEFT HEART CATHETERIZATION WITH CORONARY ANGIOGRAM;  Surgeon: Laverda Page, MD;  Location: [facility] CATH LAB;  Service: Cardiovascular;  Laterality: N/A;  . MITRAL VALVE REPLACEMENT N/A 11/08/2013   Procedure: MITRAL VALVE (MV) REPLACEMENT;  Surgeon: Ivin Poot, MD;  Location: [place];  Service: Open Heart Surgery;  Laterality: N/A;  #25 MAGNA MITRAL EASE  . PERCUTANEOUS CORONARY STENT INTERVENTION (PCI-S)  09/03/2012   Procedure: PERCUTANEOUS CORONARY STENT INTERVENTION (PCI-S);  Surgeon: Laverda Page, MD;  Location: [facility] CATH LAB;  Service: Cardiovascular;;  . TOE AMPUTATION Left 2013   left second toe amputation  . TONSILLECTOMY        Social History Social History  Substance Use Topics  . Smoking status: Never Smoker  . Smokeless tobacco: Never Used  . Alcohol use No    Allergies  Allergen Reactions  . Aspirin Other (See Comments)    High doses caused stomach bleeds    Current Outpatient Prescriptions  Medication Sig Dispense Refill  . ACCU-CHEK FASTCLIX LANCETS MISC Check BS 4x daily due to fluctuating blood sugars - Fasting, before meals and QHS- Dx: E11.65 500 each 3  . ascorbic acid (VITAMIN C) 500 MG tablet Take 1 tablet (500 mg total) by mouth 2 (two) times daily. 60 tablet 11  . aspirin 81 MG chewable tablet Chew 2 tablets (162 mg total) by mouth daily. 60 tablet 11  . B-D ULTRAFINE III SHORT PEN 31G X 8 MM MISC 5 each by Does not apply route 5 (five) times daily. Use to inject insulin 5x daily 500 each 3  . cetirizine (ZYRTEC) 10 MG tablet Take 1 tablet (10 mg total) by mouth at bedtime. 30 tablet 11  . doxazosin (CARDURA) 4 MG tablet Take 1  tablet (4 mg total) by mouth daily. 90 tablet 3  . ferrous sulfate 325 (65 FE) MG tablet Take 1 tablet (325 mg total) by mouth 2 (two) times daily with a meal. 60 tablet 11  . fluticasone (FLONASE) 50 MCG/ACT nasal spray Place 1 spray into both nostrils daily. 48 g 3  . glucose blood (ACCU-CHEK SMARTVIEW) test strip Check BS 4x daily due to fluctuating blood sugars - Fasting, before meals and QHS- Dx: E11.65 500 each 3  . insulin aspart (NOVOLOG FLEXPEN) 100 UNIT/ML FlexPen 70-120=0    251-300=5 121-150=1  301-350=7 151-200=2  351-400=9 201-250=3  400 or >, call MD  units of insulin 45 mL 3  . Insulin Glargine (LANTUS SOLOSTAR) 100 UNIT/ML Solostar Pen INJECT 20 UNITS SUBCUTANEOUSLY TWICE DAILY (Patient taking differently: Inject 20 Units into the skin daily. INJECT 20 UNITS SUBCUTANEOUSLY TWICE DAILY) 45 mL 3  . losartan (COZAAR) 25 MG tablet 2 tab po qd  2  . methocarbamol (ROBAXIN) 500 MG tablet Take 1 po q8hrs prn muscle spasms 20 tablet 0  . metoprolol tartrate (  LOPRESSOR) 25 MG tablet TAKE 1 TABLET TWICE DAILY 180 tablet 1  . nitroGLYCERIN (NITROSTAT) 0.4 MG SL tablet Place 1 tablet (0.4 mg total) under the tongue every 5 (five) minutes as needed for chest pain. 25 tablet 2  . oxyCODONE-acetaminophen (PERCOCET/ROXICET) 5-325 MG tablet Take 1 tablet by mouth every 6 (six) hours as needed for severe pain. 30 tablet 0  . pantoprazole (PROTONIX) 40 MG tablet TAKE 1 TABLET(40 MG) BY MOUTH DAILY 90 tablet 1  . simvastatin (ZOCOR) 20 MG tablet TAKE 1 TABLET(20 MG) BY MOUTH DAILY 90 tablet 1  . torsemide (DEMADEX) 20 MG tablet TAKE 1 TABLET TWICE DAILY 180 tablet 3  . traZODone (DESYREL) 50 MG tablet TAKE 1 TABLET AT BEDTIME 90 tablet 1   No current facility-administered medications for this visit.        Physical Exam There were no vitals taken for this visit. Physical Exam Ambulatory status She came in in a wheelchair normally ambulates independently  Left upper extremity Inspection  tenderness in the midshaft humerus and also proximal humerus with ecchymosis in the biceps region  Range of motion in the hand is normal we did not test elbow or shoulder because of pain and fracture stability concerns  Stability tests wrist normal    Motor exam specifically she had good finger extension at the MP joints and role of thumb extension was normal.   Neurovascular examination is intact  Lymph node palpation is normal  The opposite extremity exhibits normal range of motion stability and strength neurovascular exam is intact, lymph nodes are negative and there is no swelling or tenderness  The lower extremities were normally aligned as was the right upper extremity Data Reviewed  independent image interpretation :  I looked at her x-ray shows a midshaft oblique humerus fracture with some extension proximally but not into the joint  Assessment    Encounter Diagnosis  Name Primary?  . Closed nondisplaced oblique fracture of shaft of left humerus, initial encounter Yes        Plan    We applied a coaptation splint x-ray in 3 weeks in the cast and then application of a fracture cuff of the humerus fracture  Arther Abbott, MD 12/28/2015 2:34 PM

## 2016-01-11 ENCOUNTER — Other Ambulatory Visit: Payer: Self-pay | Admitting: Family Medicine

## 2016-01-16 DIAGNOSIS — G4734 Idiopathic sleep related nonobstructive alveolar hypoventilation: Secondary | ICD-10-CM | POA: Diagnosis not present

## 2016-01-18 ENCOUNTER — Encounter: Payer: Self-pay | Admitting: Orthopedic Surgery

## 2016-01-18 ENCOUNTER — Ambulatory Visit (INDEPENDENT_AMBULATORY_CARE_PROVIDER_SITE_OTHER): Payer: Self-pay | Admitting: Orthopedic Surgery

## 2016-01-18 ENCOUNTER — Ambulatory Visit (INDEPENDENT_AMBULATORY_CARE_PROVIDER_SITE_OTHER): Payer: Commercial Managed Care - HMO

## 2016-01-18 DIAGNOSIS — S42335D Nondisplaced oblique fracture of shaft of humerus, left arm, subsequent encounter for fracture with routine healing: Secondary | ICD-10-CM

## 2016-01-18 MED ORDER — OXYCODONE-ACETAMINOPHEN 5-325 MG PO TABS: 1.0000 | ORAL_TABLET | Freq: Four times a day (QID) | ORAL | 0 refills | Status: DC | PRN

## 2016-01-18 NOTE — Patient Instructions (Signed)
Wear fracture cuff al the time

## 2016-01-18 NOTE — Progress Notes (Signed)
Patient ID: Claudia [name], female   DOB: June 12, 1931, 82 y.o.   MRN: [id number]  Chief Complaint  Patient presents with  . Follow-up    left humerus fracture 12/24/15    HPI Claudia [name] is a 82 y.o. female.   HPI  Proximal humerus fracture on October 19, 3-1/2 weeks post injury treated with coaptation splint  Review of Systems Review of Systems   Physical Exam There were no vitals taken for this visit.   Physical Exam Radial nerve function intact  X-rays show fracture aligned perfectly  Patient placed in fracture cuff    x-ray in 4 weeks

## 2016-02-09 ENCOUNTER — Telehealth: Payer: Self-pay | Admitting: Family Medicine

## 2016-02-09 NOTE — Telephone Encounter (Signed)
Claudia [name] # [id number] for 6 visits with Dr Einar Gip, cardiology, from 12/10/15 - 06/07/16 Dx I25.10 AHD

## 2016-02-09 NOTE — Telephone Encounter (Signed)
[employer] ref Josem Kaufmann # [id number] for Dr Tempie Hoist at [place] for 6 visits 12/14/15 -06/11/16 Dx H26.9 cataracts

## 2016-02-15 ENCOUNTER — Ambulatory Visit: Payer: Commercial Managed Care - HMO | Admitting: Podiatry

## 2016-02-15 ENCOUNTER — Ambulatory Visit (INDEPENDENT_AMBULATORY_CARE_PROVIDER_SITE_OTHER): Payer: Commercial Managed Care - HMO | Admitting: Orthopedic Surgery

## 2016-02-15 ENCOUNTER — Ambulatory Visit (INDEPENDENT_AMBULATORY_CARE_PROVIDER_SITE_OTHER): Payer: Commercial Managed Care - HMO

## 2016-02-15 ENCOUNTER — Encounter: Payer: Self-pay | Admitting: Orthopedic Surgery

## 2016-02-15 DIAGNOSIS — G4734 Idiopathic sleep related nonobstructive alveolar hypoventilation: Secondary | ICD-10-CM | POA: Diagnosis not present

## 2016-02-15 DIAGNOSIS — S42202D Unspecified fracture of upper end of left humerus, subsequent encounter for fracture with routine healing: Secondary | ICD-10-CM | POA: Diagnosis not present

## 2016-02-15 MED ORDER — HYDROCODONE-ACETAMINOPHEN 5-325 MG PO TABS: 1.0000 | ORAL_TABLET | Freq: Four times a day (QID) | ORAL | 0 refills | Status: DC | PRN

## 2016-02-15 NOTE — Progress Notes (Signed)
Follow-up left midshaft humerus fracture treated with a fracture cuff  Radial nerve is working intact with intact flexion extension IP joint extension of the thumb MP joint of the hand.  X-rays show fracture is in good alignment with good healing  We will x-ray again in 6 weeks continue fracture cuff 6 weeks. We gave her some medicine for pain Encounter Diagnosis  Name Primary?  . Closed fracture of proximal end of left humerus with routine healing, unspecified fracture morphology, subsequent encounter Yes    X-ray 6 weeks out of the fracture cuff

## 2016-03-02 ENCOUNTER — Encounter (HOSPITAL_COMMUNITY): Payer: Self-pay | Admitting: *Deleted

## 2016-03-02 ENCOUNTER — Emergency Department (HOSPITAL_COMMUNITY)
Admission: EM | Admit: 2016-03-02 | Discharge: 2016-03-02 | Disposition: A | Discharge status: Home / Self Care | Payer: Commercial Managed Care - HMO | Attending: Emergency Medicine | Admitting: Emergency Medicine

## 2016-03-02 DIAGNOSIS — Z955 Presence of coronary angioplasty implant and graft: Secondary | ICD-10-CM | POA: Insufficient documentation

## 2016-03-02 DIAGNOSIS — E1122 Type 2 diabetes mellitus with diabetic chronic kidney disease: Secondary | ICD-10-CM | POA: Diagnosis not present

## 2016-03-02 DIAGNOSIS — Y9389 Activity, other specified: Secondary | ICD-10-CM | POA: Diagnosis not present

## 2016-03-02 DIAGNOSIS — W1839XA Other fall on same level, initial encounter: Secondary | ICD-10-CM | POA: Diagnosis not present

## 2016-03-02 DIAGNOSIS — Y929 Unspecified place or not applicable: Secondary | ICD-10-CM | POA: Diagnosis not present

## 2016-03-02 DIAGNOSIS — Z79899 Other long term (current) drug therapy: Secondary | ICD-10-CM | POA: Diagnosis not present

## 2016-03-02 DIAGNOSIS — I509 Heart failure, unspecified: Secondary | ICD-10-CM | POA: Insufficient documentation

## 2016-03-02 DIAGNOSIS — I130 Hypertensive heart and chronic kidney disease with heart failure and stage 1 through stage 4 chronic kidney disease, or unspecified chronic kidney disease: Secondary | ICD-10-CM | POA: Insufficient documentation

## 2016-03-02 DIAGNOSIS — Z794 Long term (current) use of insulin: Secondary | ICD-10-CM | POA: Diagnosis not present

## 2016-03-02 DIAGNOSIS — I2510 Atherosclerotic heart disease of native coronary artery without angina pectoris: Secondary | ICD-10-CM | POA: Insufficient documentation

## 2016-03-02 DIAGNOSIS — Z85828 Personal history of other malignant neoplasm of skin: Secondary | ICD-10-CM | POA: Insufficient documentation

## 2016-03-02 DIAGNOSIS — Z7982 Long term (current) use of aspirin: Secondary | ICD-10-CM | POA: Insufficient documentation

## 2016-03-02 DIAGNOSIS — S51811A Laceration without foreign body of right forearm, initial encounter: Secondary | ICD-10-CM

## 2016-03-02 DIAGNOSIS — Y999 Unspecified external cause status: Secondary | ICD-10-CM | POA: Diagnosis not present

## 2016-03-02 DIAGNOSIS — N184 Chronic kidney disease, stage 4 (severe): Secondary | ICD-10-CM | POA: Insufficient documentation

## 2016-03-02 NOTE — ED Triage Notes (Signed)
Pt states she fell backward tonight after losing her balance; pt has large laceration to right forearm; bleeding controlled at this time

## 2016-03-02 NOTE — ED Provider Notes (Signed)
[place] DEPT Provider Note   CSN: [id number] Arrival date & time: 03/02/16  0201     History   Chief Complaint Chief Complaint  Patient presents with  . Laceration    HPI Claudia [name] is a 81 y.o. female.  HPI Patient presents emergency department after falling backwards this evening when attempting to use the restroom.  She fell resulting in a large skin tear to her right posterior forearm.  She presents requesting repair for skin tear.  No head injury.  No neck pain.  No chest pain shortness of breath.  No abdominal pain.  Denies weakness in her upper lower extremities.  She reports she's had issues with balance over the past several months.  She uses a walker but reports that she feels like she's been falling backwards more lately.   Past Medical History:  Diagnosis Date  . Anemia   . Arthritis   . Cancer (HCC)    hx of skin cancer  . CHF (congestive heart failure) ([place])   . Chronic renal failure, stage 3 (moderate)   . Coronary artery disease   . Diabetes mellitus    insulin dependent  . GERD (gastroesophageal reflux disease)   . Headache(784.0)    rare migraines  . Hypercholesterolemia   . Hypertension   . Myocardial infarction 2013  . Osteopenia   . Proliferative retinopathy due to DM ([place])   . PUD (peptic ulcer disease)   . S/P colonoscopy Sept 2011   left-sided diverticula, tubular adenoma  . S/P endoscopy Aug 2011   3 superficial gastric ulcers, NSAID-induced  . Shortness of breath   . SIADH (syndrome of inappropriate ADH production) [facility])     Patient Active Problem List   Diagnosis Date Noted  . Atherosclerosis of aorta ([place]) 02/24/2015  . Infection with ESBL Klebsiella oxytoca 06/20/2014  . Acute encephalopathy 06/18/2014  . Elevated troponin 06/17/2014  . UTI (lower urinary tract infection) 06/17/2014  . Altered mental status 02/24/2014  . Aphasia   . Urinary tract infectious disease   . UTI (urinary tract infection) 02/23/2014  .  CKD (chronic kidney disease) stage 4, GFR 15-29 ml/min (HCC) 12/24/2013  . Fluid overload 12/24/2013  . Volume overload 12/23/2013  . S/P mitral valve replacement with bioprosthetic valve 12/09/2013  . Atrial fibrillation ([place]) 12/09/2013  . TIA (transient ischemic attack) 12/01/2013  . S/P CABG x 2 11/08/2013  . Unstable angina pectoris ([place]) 11/05/2013  . ACS (acute coronary syndrome) ([place]) 06/26/2013  . NSTEMI (non-ST elevated myocardial infarction) ([place]) 06/26/2013  . CHF (congestive heart failure) ([place]) 06/25/2012  . Hypercholesterolemia   . Osteopenia   . Unstable angina ([place]) 12/21/2011  . Osteomyelitis of toe ([place]) 10/26/2011  . ARF (acute renal failure) ([place]) 10/26/2011  . Hyperkalemia 10/26/2011  . Diarrhea 07/29/2011  . Hyponatremia 07/28/2011  . Nausea and vomiting 07/28/2011  . Benign hypertension 07/28/2011  . DM (diabetes mellitus), type 2 ([place]) 07/28/2011  . CAD (coronary artery disease), native coronary artery 07/20/2011  . GERD 12/28/2009  . PUD, HX OF 12/28/2009  . COLONIC POLYPS, ADENOMATOUS, HX OF 12/28/2009  . ANEMIA 10/22/2009    Past Surgical History:  Procedure Laterality Date  . APPENDECTOMY    . CARDIAC CATHETERIZATION  01/22/2013  . cardiac stents  07/19/2011  . COLONOSCOPY  12/04/09   normal rectum/left-sided diverticula  . CORONARY ANGIOPLASTY  07/2011  . CORONARY ARTERY BYPASS GRAFT N/A 11/08/2013   Procedure: CORONARY ARTERY BYPASS GRAFTING (CABG), on pump, times two,  using left internal mammary artery, cryo saphenous vein.;  Surgeon: Ivin Poot, MD;  Location: [place];  Service: Open Heart Surgery;  Laterality: N/A;  LIMA-LAD CRYOVEIN -OM  . ESOPHAGOGASTRODUODENOSCOPY  10/16/09   normal without barrett's/three superficial gastric ulcers  . EYE SURGERY     cataracts  . INTRAOPERATIVE TRANSESOPHAGEAL ECHOCARDIOGRAM N/A 11/08/2013   Procedure: INTRAOPERATIVE TRANSESOPHAGEAL ECHOCARDIOGRAM;  Surgeon: Ivin Poot, MD;  Location: [place];  Service:  Open Heart Surgery;  Laterality: N/A;  . JOINT REPLACEMENT  arthroscopy to knee  . LEFT HEART CATHETERIZATION WITH CORONARY ANGIOGRAM N/A 07/19/2011   Procedure: LEFT HEART CATHETERIZATION WITH CORONARY ANGIOGRAM;  Surgeon: Laverda Page, MD;  Location: [facility] CATH LAB;  Service: Cardiovascular;  Laterality: N/A;  . LEFT HEART CATHETERIZATION WITH CORONARY ANGIOGRAM N/A 12/21/2011   Procedure: LEFT HEART CATHETERIZATION WITH CORONARY ANGIOGRAM;  Surgeon: Laverda Page, MD;  Location: [facility] CATH LAB;  Service: Cardiovascular;  Laterality: N/A;  . LEFT HEART CATHETERIZATION WITH CORONARY ANGIOGRAM N/A 02/20/2012   Procedure: LEFT HEART CATHETERIZATION WITH CORONARY ANGIOGRAM;  Surgeon: Laverda Page, MD;  Location: [facility] CATH LAB;  Service: Cardiovascular;  Laterality: N/A;  . LEFT HEART CATHETERIZATION WITH CORONARY ANGIOGRAM N/A 09/03/2012   Procedure: LEFT HEART CATHETERIZATION WITH CORONARY ANGIOGRAM;  Surgeon: Laverda Page, MD;  Location: [facility] CATH LAB;  Service: Cardiovascular;  Laterality: N/A;  . LEFT HEART CATHETERIZATION WITH CORONARY ANGIOGRAM N/A 12/04/2012   Procedure: LEFT HEART CATHETERIZATION WITH CORONARY ANGIOGRAM;  Surgeon: Laverda Page, MD;  Location: [facility] CATH LAB;  Service: Cardiovascular;  Laterality: N/A;  . LEFT HEART CATHETERIZATION WITH CORONARY ANGIOGRAM N/A 01/22/2013   Procedure: LEFT HEART CATHETERIZATION WITH CORONARY ANGIOGRAM;  Surgeon: Laverda Page, MD;  Location: [facility] CATH LAB;  Service: Cardiovascular;  Laterality: N/A;  . LEFT HEART CATHETERIZATION WITH CORONARY ANGIOGRAM N/A 06/26/2013   Procedure: LEFT HEART CATHETERIZATION WITH CORONARY ANGIOGRAM;  Surgeon: Laverda Page, MD;  Location: [facility] CATH LAB;  Service: Cardiovascular;  Laterality: N/A;  . LEFT HEART CATHETERIZATION WITH CORONARY ANGIOGRAM N/A 11/05/2013   Procedure: LEFT HEART CATHETERIZATION WITH CORONARY ANGIOGRAM;  Surgeon: Laverda Page, MD;  Location: [facility] CATH LAB;  Service:  Cardiovascular;  Laterality: N/A;  . MITRAL VALVE REPLACEMENT N/A 11/08/2013   Procedure: MITRAL VALVE (MV) REPLACEMENT;  Surgeon: Ivin Poot, MD;  Location: [place];  Service: Open Heart Surgery;  Laterality: N/A;  #25 MAGNA MITRAL EASE  . PERCUTANEOUS CORONARY STENT INTERVENTION (PCI-S)  09/03/2012   Procedure: PERCUTANEOUS CORONARY STENT INTERVENTION (PCI-S);  Surgeon: Laverda Page, MD;  Location: [facility] CATH LAB;  Service: Cardiovascular;;  . TOE AMPUTATION Left 2013   left second toe amputation  . TONSILLECTOMY      OB History    No data available       Home Medications    Prior to Admission medications   Medication Sig Start Date End Date Taking? Authorizing Provider  ACCU-CHEK FASTCLIX LANCETS MISC Check BS 4x daily due to fluctuating blood sugars - Fasting, before meals and QHS- Dx: E11.65 04/16/15   Susy Frizzle, MD  ascorbic acid (VITAMIN C) 500 MG tablet Take 1 tablet (500 mg total) by mouth 2 (two) times daily. 06/04/14   Susy Frizzle, MD  aspirin 81 MG chewable tablet Chew 2 tablets (162 mg total) by mouth daily. 06/04/14   Susy Frizzle, MD  B-D ULTRAFINE III SHORT PEN 31G X 8 MM MISC 5 each by Does not apply route 5 (  five) times daily. Use to inject insulin 5x daily 04/16/15   Susy Frizzle, MD  cetirizine (ZYRTEC) 10 MG tablet Take 1 tablet (10 mg total) by mouth at bedtime. 06/04/14   Susy Frizzle, MD  doxazosin (CARDURA) 4 MG tablet Take 1 tablet (4 mg total) by mouth daily. 11/27/15   Susy Frizzle, MD  ferrous sulfate 325 (65 FE) MG tablet Take 1 tablet (325 mg total) by mouth 2 (two) times daily with a meal. 06/04/14   Susy Frizzle, MD  fluticasone (FLONASE) 50 MCG/ACT nasal spray Place 1 spray into both nostrils daily. 04/16/15   Susy Frizzle, MD  glucose blood (ACCU-CHEK SMARTVIEW) test strip Check BS 4x daily due to fluctuating blood sugars - Fasting, before meals and QHS- Dx: E11.65 04/16/15   Susy Frizzle, MD  HYDROcodone-acetaminophen  (NORCO/VICODIN) 5-325 MG tablet Take 1 tablet by mouth every 6 (six) hours as needed for moderate pain. 02/15/16   Carole Civil, MD  insulin aspart (NOVOLOG FLEXPEN) 100 UNIT/ML FlexPen 70-120=0    251-300=5 121-150=1  301-350=7 151-200=2  351-400=9 201-250=3  400 or >, call MD  units of insulin 04/16/15   Susy Frizzle, MD  Insulin Glargine (LANTUS SOLOSTAR) 100 UNIT/ML Solostar Pen INJECT 20 UNITS SUBCUTANEOUSLY TWICE DAILY Patient taking differently: Inject 20 Units into the skin daily. INJECT 20 UNITS SUBCUTANEOUSLY TWICE DAILY 04/16/15   Susy Frizzle, MD  losartan (COZAAR) 25 MG tablet 2 tab po qd 03/19/15   Historical Provider, MD  methocarbamol (ROBAXIN) 500 MG tablet Take 1 po q8hrs prn muscle spasms 12/25/15   Rolland Porter, MD  metoprolol tartrate (LOPRESSOR) 25 MG tablet TAKE 1 TABLET TWICE DAILY 10/29/15   Susy Frizzle, MD  nitroGLYCERIN (NITROSTAT) 0.4 MG SL tablet Place 1 tablet (0.4 mg total) under the tongue every 5 (five) minutes as needed for chest pain. 06/27/14   Susy Frizzle, MD  oxyCODONE-acetaminophen (PERCOCET/ROXICET) 5-325 MG tablet Take 1 tablet by mouth every 6 (six) hours as needed for severe pain. 01/18/16   Carole Civil, MD  pantoprazole (PROTONIX) 40 MG tablet TAKE 1 TABLET EVERY DAY 01/11/16   Susy Frizzle, MD  simvastatin (ZOCOR) 20 MG tablet TAKE 1 TABLET EVERY DAY 01/11/16   Susy Frizzle, MD  torsemide (DEMADEX) 20 MG tablet TAKE 1 TABLET TWICE DAILY 11/16/15   Susy Frizzle, MD  traZODone (DESYREL) 50 MG tablet TAKE 1 TABLET AT BEDTIME 12/21/15   Susy Frizzle, MD    Family History History reviewed. No pertinent family history.  Social History Social History  Substance Use Topics  . Smoking status: Never Smoker  . Smokeless tobacco: Never Used  . Alcohol use No     Allergies   Aspirin   Review of Systems Review of Systems  All other systems reviewed and are negative.    Physical Exam Updated Vital Signs BP 150/55  (BP Location: Right Arm)   Pulse (!) 59   Temp 97.7 F (36.5 C) (Oral)   Resp 16   Ht 5\' 6"  (1.676 m)   Wt 166 lb (75.3 kg)   SpO2 99%   BMI 26.79 kg/m   Physical Exam  Constitutional: She is oriented to person, place, and time. She appears well-developed and well-nourished.  HENT:  Head: Normocephalic and atraumatic.  Eyes: EOM are normal.  Neck: Normal range of motion. Neck supple.  Pulmonary/Chest: Effort normal.  Abdominal: She exhibits no distension.  Musculoskeletal:  Full  range of motion bilateral hips and knees.  Full range of motion bilateral wrists, elbows, shoulders.  Large skin tear to the posterior right proximal forearm without bleeding  Neurological: She is alert and oriented to person, place, and time.  Psychiatric: She has a normal mood and affect.  Nursing note and vitals reviewed.    ED Treatments / Results  Labs (all labs ordered are listed, but only abnormal results are displayed) Labs Reviewed - No data to display  EKG  EKG Interpretation None       Radiology No results found.  Procedures Procedures (including critical care time)   +++++++++++++++++++++++++++++++++++++++++++++++++++++  LACERATION REPAIR/SKIN TEAR REPAIR Performed by: Hoy Morn Consent: Verbal consent obtained. Risks and benefits: risks, benefits and alternatives were discussed Patient identity confirmed: provided demographic data Time out performed prior to procedure Prepped and Draped in normal sterile fashion Wound explored Laceration Location: right posterior forearm Laceration Length: 8cm No Foreign Bodies seen or palpated Anesthesia: none Amount of cleaning: standard Skin closure: steri strips and dermabond Number of sutures or staples: 7 steri strips  Technique: as above Patient tolerance: Patient tolerated the procedure well with no immediate complications.   Medications Ordered in ED Medications - No data to display   Initial Impression /  Assessment and Plan / ED Course  I have reviewed the triage vital signs and the nursing notes.  Pertinent labs & imaging results that were available during my care of the patient were reviewed by me and considered in my medical decision making (see chart for details).  Clinical Course     Primary care and neurology follow-up for recurrent falls.  I also recommended she may benefit from physical therapy for balance training.  Skin tear repaired with a combination of Dermabond and Steri-Strips.  Infection warnings given.  No joint pain.  No indication for additional workup at this time  Final Clinical Impressions(s) / ED Diagnoses   Final diagnoses:  Skin tear of right forearm without complication, initial encounter    New Prescriptions New Prescriptions   No medications on file     Jola Schmidt, MD 03/02/16 [phone]

## 2016-03-14 DIAGNOSIS — E1122 Type 2 diabetes mellitus with diabetic chronic kidney disease: Secondary | ICD-10-CM | POA: Diagnosis not present

## 2016-03-14 DIAGNOSIS — I10 Essential (primary) hypertension: Secondary | ICD-10-CM | POA: Diagnosis not present

## 2016-03-14 DIAGNOSIS — I5032 Chronic diastolic (congestive) heart failure: Secondary | ICD-10-CM | POA: Diagnosis not present

## 2016-03-14 DIAGNOSIS — I2510 Atherosclerotic heart disease of native coronary artery without angina pectoris: Secondary | ICD-10-CM | POA: Diagnosis not present

## 2016-03-15 ENCOUNTER — Encounter: Payer: Self-pay | Admitting: Family Medicine

## 2016-03-15 ENCOUNTER — Other Ambulatory Visit: Payer: Self-pay | Admitting: Family Medicine

## 2016-03-15 ENCOUNTER — Ambulatory Visit (INDEPENDENT_AMBULATORY_CARE_PROVIDER_SITE_OTHER): Payer: Medicare HMO | Admitting: Family Medicine

## 2016-03-15 VITALS — BP 126/58 | HR 58 | Temp 98.5°F | Resp 18 | Ht 67.0 in | Wt 165.0 lb

## 2016-03-15 DIAGNOSIS — R269 Unspecified abnormalities of gait and mobility: Secondary | ICD-10-CM

## 2016-03-15 DIAGNOSIS — R296 Repeated falls: Secondary | ICD-10-CM | POA: Diagnosis not present

## 2016-03-15 DIAGNOSIS — R29898 Other symptoms and signs involving the musculoskeletal system: Secondary | ICD-10-CM | POA: Diagnosis not present

## 2016-03-15 NOTE — Progress Notes (Signed)
Subjective:    Patient ID: Claudia [name], female    DOB: 1931/10/27, 83 y.o.   MRN: [id number]  HPI 08/2014 Patient is here today for complete physical exam. She looks much better from the last time I saw her. She is now able to stand up from a seated position just holding onto the counter. This is a marked improvement from when she was wheelchair-bound just a month ago. She is more alert. Her memory has improved. She is exercising around the house by walking with a walker. She has not had a urinary tract infection and almost a month. Overall she seems to be improving dramatically. She is still very weak and she still requires a walker to ambulate but again I cannot emphasize how much improvement she is demonstrating from her last visit. We had a long discussion today regarding cancer screening. Just earlier this year I was discussing hospice placement with this patient. Therefore I do not believe it makes medical sense to pursue cancer screening such as a mammogram, Pap smear, colonoscopy. Both the patient and her son are in agreement. She is due for Pneumovax 23 today. She will receive that. She has had Prevnar 13. Otherwise her preventative care is up-to-date.  At that time, my plan was: Physical exam is significant for several cancerous appearing lesions on her left cheek. One appears to be a basal cell cancer. The other appears to be a squamous cell cancer. Given their size and location, I will refer the patient to a dermatologist. Patient received Pneumovax 23 today. Her weight has stabilized and has actually improved 3 pounds. She is no longer rapidly losing weight. Her pulmonary edema has resolved. She does have some trace peripheral edema but this is good for this patient and is at her baseline. Her muscle strength and deconditioning appears to be improving. I will obtain a CBC, CMP, fasting lipid panel, hemoglobin A1c, and a urinalysis. Goal hemoglobin A1c is less than 8 given the patient's  frailty. At the present time on Lantus 18 units twice daily, her blood sugars are all less than 200 and I believe this is acceptable. I will check a urinalysis to rule out a urinary tract infection as this is seem to be the cause of the patient's decompensation recently and I want to be proactive about finding that prior to it causing her to decompensate.  12/26/14 Patient's weight has fallen to 164 pounds. She reports dry mouth as well as some orthostatic dizziness. Her blood pressure today is relatively low at 100/62. Her heart rate typically runs between 50 and 60 bpm. She is currently on metoprolol 12.5 mg by mouth twice a day as well as torsemide by mouth twice a day.  However the patient has done remarkably well since when I last saw her. She is now walking around her home without the use of a walker which I feel is a minor miracle given how decompensated she was earlier this year. At that time I was even considering hospice but the patient has rallied  And her muscle tone and strength has improved dramatically. Earlier last week she was even walking around [place] simply using a buggy for stability.  Her blood pressure typically ranges between [id number] systolic over [id number] diastolic.  Her blood sugars have been fluctuating between 70 and 200 although typically they're less than 170 which I feel is entirely appropriate given her age and comorbidities. Her last hemoglobin A1c was excellent at 7.1. She is due today  for her flu shot.  At that time, my plan was: Given her low blood pressure and her  Relative bradycardia, I will continue metoprolol 12.5 mg by mouth twice a day. However given her chronic kidney disease, or loss of weight, and her dry mouth, I would recommend decreasing torsemide to once a day. Her insulin seems appropriately dosed based on her blood sugars. She's been taking 10 units in the morning and 8 units in the evening. I have recommended just taking 18 units once a day for simplicity sake.  She also received her flu shot today. Recheck fasting lab work after the first of the year.  04/27/15 Since I last saw the patient, she has been doing exceptionally well. She is currently taking Lantus 20 units subcutaneous daily. She may use 1-2 units of NovoLog with meals depending upon what her sugars are. Her most recent hemoglobin A1c which was checked at her cardiologist's office was found to be 6.9. Her fasting blood sugars are under 130 and her 2 hour postprandial sugars are well below 180.  She denies any hypoglycemia. Her strength continues to improve. She is now able to walk around [place] with minimal assistance. She is able to walk out and get into her son's truck without assistance. This is dramatically improved from last time I saw her. Her weight remains stable at 167 pounds. She denies any chest pain shortness of breath or dyspnea on exertion. However her blood pressure has been elevated recently. Her cardiologist recently started her on losartan 25 mg by mouth daily. Her blood pressure continues to remain high at 160/62. However her creatinine recently increased as well since her cardiologist started her on losartan.  At that time, my plan was: I am very pleased with her blood sugars. I did ask her son to bring a copy of her lab work and so that I can look at her cholesterol, her CMP, fasting lipid panel, and her hemoglobin A1c but I would make no changes based on the numbers he is telling me. Her blood pressure is slightly elevated and therefore I also added amlodipine 5 mg by mouth daily to help control her blood pressure without exacerbating her chronic kidney disease. Recheck blood pressure in one month but otherwise I am ecstatic about how well her sugars are controlled on how well she is doing. I will make no changes in her medication at this time. Her goal LDL cholesterol is less than 70.  Because of her CKD, I believe she is a poor candidate for entresto.  10/15/15 Wt Readings from  Last 3 Encounters:  03/15/16 165 lb (74.8 kg)  03/02/16 166 lb (75.3 kg)  12/25/15 169 lb (76.7 kg)   Patient continues to do remarkably well. Her weight is essentially the same as it was in February. Unfortunately she is retaining a significant amount of fluid in her legs. She has +1 to +2 pitting edema to the knees in both legs. This is been going on for several months. It seems to coincide with when I put her on amlodipine for her blood pressure at the last visit. She is still taking her fluid pill. She is taking it twice a day on Mondays Wednesdays and Fridays and once a day on all other days. She denies any chest pain shortness of breath or dyspnea on exertion. Her fasting blood sugars are typically between 101 130. Her two-hour postprandial sugars are below 200 the vast majority of the time. Her blood pressure at home ranges  between 130 and 147/60-80. Overall I'm very happy with her numbers. She does report dysuria urgency and frequency for the last week or so. I cannot perform a urinalysis today is her lab is under Architect. I can send a urine culture.  At that time, my plan was: Symptoms sound like a urinary tract infection. Begin Keflex 500 mg by mouth 3 times a day for 7 days. Send urine culture. Blood pressure at home is excellent but I will discontinue amlodipine because of the fluid retention. To compensate I will increase losartan to 50 mg a day. I will check her renal function today and check it again in one week when I recheck her blood pressure. I will also check a hemoglobin A1c. Given her age, hemoglobin A1c less than 7 would be outstanding. For the most part I believe we will be there based on the sugars that she is reporting. Increase fluid pill to torsemide 20 mg twice a day for the next week and then recheck in one week. At that time we will likely have her resume her previous dose of diuretic.  No visits with results within 1 Week(s) from this visit.  Latest known visit with  results is:  Abstract on 12/23/2015  Component Date Value Ref Range Status  . HM Diabetic Eye Exam 12/14/2015 Retinopathy* No Retinopathy Final   10/22/15 Renal function was worse, so I had the patient hold amlodipine, I did not increase losartan, and I did increase the lasix as planned.   Unfortunately, we were unable to reach the son with this information. Therefore she doubled her dose of Lasix and increase her losartan. The swelling in her legs is much better. They're still pitting edema around her ankles but not as prevalent as it was earlier this week. Her blood pressure is excellent. In fact is a little on the low side. She denies any chest pain or shortness of breath.  At that time, my plan was: Given her renal function I want him to decrease losartan back to 25 mg a day. Stay off the amlodipine as this is likely contributing to her swelling. Resume her previous dose of Lasix. Recheck her renal function. If worsening, we may need to hold losartan for short period of time. I encouraged the patient to drink 4-5 glasses of water a day  11/13/15 Ultimately, had to add cardura 2 mg poqday for HTN.  Here for follow up.  BP is elevated at home 150-160/80.  Weight is down to 164 lbs.  No residual edema.  At that time, my plan was: Up cardura to 4 mg a day and recheck bp in 2 weeks.  Repeat bmp and if renal fcn is declining, may need to reduce torsemide due to weight loss and dehydration.  03/15/16 Since I last saw the patient, she has fallen twice. In October she bent over to pick up something off the floor and lost her balance striking her left arm against a metal chair fracturing her left humerus. Recently she fell backward after standing up suffering a skin tear to the right forearm. She was seen in emergency room and the wound was dressed and bandaged with Steri-Strips. There is a long skin tear on the dorsal surface of her right forearm. However the skin edges were well approximated with Steri-Strips  and the tear has essentially healed except at the very medial and lateral margins. At the lateral margin there is an area the size of a quarter that is a superficial ulcer  showing underlying dermis total with a Tegaderm. At the medial margin there is a similar area the size of a dime which I covered with a nonadherent gauze and wrapped in Coban and. I recommended removing the dressing in 72 hours and I expect that the medial portion was healed at that point. They can remove the Tegaderm after 5 days. I believe this is all that this point will need. My biggest concern is wide as the patient keeps falling. I believe this is a combination of deconditioning, peripheral neuropathy, and possibly orthostatic dizziness. I will check orthostatic vitals today  Past Medical History:  Diagnosis Date  . Anemia   . Arthritis   . Cancer (HCC)    hx of skin cancer  . CHF (congestive heart failure) ([place])   . Chronic renal failure, stage 3 (moderate)   . Coronary artery disease   . Diabetes mellitus    insulin dependent  . GERD (gastroesophageal reflux disease)   . Headache(784.0)    rare migraines  . Hypercholesterolemia   . Hypertension   . Myocardial infarction 2013  . Osteopenia   . Proliferative retinopathy due to DM ([place])   . PUD (peptic ulcer disease)   . S/P colonoscopy Sept 2011   left-sided diverticula, tubular adenoma  . S/P endoscopy Aug 2011   3 superficial gastric ulcers, NSAID-induced  . Shortness of breath   . SIADH (syndrome of inappropriate ADH production) ([place])    Past Surgical History:  Procedure Laterality Date  . APPENDECTOMY    . CARDIAC CATHETERIZATION  01/22/2013  . cardiac stents  07/19/2011  . COLONOSCOPY  12/04/09   normal rectum/left-sided diverticula  . CORONARY ANGIOPLASTY  07/2011  . CORONARY ARTERY BYPASS GRAFT N/A 11/08/2013   Procedure: CORONARY ARTERY BYPASS GRAFTING (CABG), on pump, times two, using left internal mammary artery, cryo saphenous vein.;  Surgeon:  Ivin Poot, MD;  Location: [facility];  Service: Open Heart Surgery;  Laterality: N/A;  LIMA-LAD CRYOVEIN -OM  . ESOPHAGOGASTRODUODENOSCOPY  10/16/09   normal without barrett's/three superficial gastric ulcers  . EYE SURGERY     cataracts  . INTRAOPERATIVE TRANSESOPHAGEAL ECHOCARDIOGRAM N/A 11/08/2013   Procedure: INTRAOPERATIVE TRANSESOPHAGEAL ECHOCARDIOGRAM;  Surgeon: Ivin Poot, MD;  Location: [facility];  Service: Open Heart Surgery;  Laterality: N/A;  . JOINT REPLACEMENT  arthroscopy to knee  . LEFT HEART CATHETERIZATION WITH CORONARY ANGIOGRAM N/A 07/19/2011   Procedure: LEFT HEART CATHETERIZATION WITH CORONARY ANGIOGRAM;  Surgeon: Laverda Page, MD;  Location: [facility] CATH LAB;  Service: Cardiovascular;  Laterality: N/A;  . LEFT HEART CATHETERIZATION WITH CORONARY ANGIOGRAM N/A 12/21/2011   Procedure: LEFT HEART CATHETERIZATION WITH CORONARY ANGIOGRAM;  Surgeon: Laverda Page, MD;  Location: [facility] CATH LAB;  Service: Cardiovascular;  Laterality: N/A;  . LEFT HEART CATHETERIZATION WITH CORONARY ANGIOGRAM N/A 02/20/2012   Procedure: LEFT HEART CATHETERIZATION WITH CORONARY ANGIOGRAM;  Surgeon: Laverda Page, MD;  Location: [facility] CATH LAB;  Service: Cardiovascular;  Laterality: N/A;  . LEFT HEART CATHETERIZATION WITH CORONARY ANGIOGRAM N/A 09/03/2012   Procedure: LEFT HEART CATHETERIZATION WITH CORONARY ANGIOGRAM;  Surgeon: Laverda Page, MD;  Location: [facility] CATH LAB;  Service: Cardiovascular;  Laterality: N/A;  . LEFT HEART CATHETERIZATION WITH CORONARY ANGIOGRAM N/A 12/04/2012   Procedure: LEFT HEART CATHETERIZATION WITH CORONARY ANGIOGRAM;  Surgeon: Laverda Page, MD;  Location: [facility] CATH LAB;  Service: Cardiovascular;  Laterality: N/A;  . LEFT HEART CATHETERIZATION WITH CORONARY ANGIOGRAM N/A 01/22/2013   Procedure: LEFT HEART CATHETERIZATION WITH CORONARY  ANGIOGRAM;  Surgeon: Laverda Page, MD;  Location: [facility] CATH LAB;  Service: Cardiovascular;  Laterality: N/A;  . LEFT HEART  CATHETERIZATION WITH CORONARY ANGIOGRAM N/A 06/26/2013   Procedure: LEFT HEART CATHETERIZATION WITH CORONARY ANGIOGRAM;  Surgeon: Laverda Page, MD;  Location: [facility] CATH LAB;  Service: Cardiovascular;  Laterality: N/A;  . LEFT HEART CATHETERIZATION WITH CORONARY ANGIOGRAM N/A 11/05/2013   Procedure: LEFT HEART CATHETERIZATION WITH CORONARY ANGIOGRAM;  Surgeon: Laverda Page, MD;  Location: [facility] CATH LAB;  Service: Cardiovascular;  Laterality: N/A;  . MITRAL VALVE REPLACEMENT N/A 11/08/2013   Procedure: MITRAL VALVE (MV) REPLACEMENT;  Surgeon: Ivin Poot, MD;  Location: [place];  Service: Open Heart Surgery;  Laterality: N/A;  #25 MAGNA MITRAL EASE  . PERCUTANEOUS CORONARY STENT INTERVENTION (PCI-S)  09/03/2012   Procedure: PERCUTANEOUS CORONARY STENT INTERVENTION (PCI-S);  Surgeon: Laverda Page, MD;  Location: [facility] CATH LAB;  Service: Cardiovascular;;  . TOE AMPUTATION Left 2013   left second toe amputation  . TONSILLECTOMY     Current Outpatient Prescriptions on File Prior to Visit  Medication Sig Dispense Refill  . ACCU-CHEK FASTCLIX LANCETS MISC Check BS 4x daily due to fluctuating blood sugars - Fasting, before meals and QHS- Dx: E11.65 500 each 3  . ascorbic acid (VITAMIN C) 500 MG tablet Take 1 tablet (500 mg total) by mouth 2 (two) times daily. 60 tablet 11  . aspirin 81 MG chewable tablet Chew 2 tablets (162 mg total) by mouth daily. 60 tablet 11  . B-D ULTRAFINE III SHORT PEN 31G X 8 MM MISC 5 each by Does not apply route 5 (five) times daily. Use to inject insulin 5x daily 500 each 3  . cetirizine (ZYRTEC) 10 MG tablet Take 1 tablet (10 mg total) by mouth at bedtime. 30 tablet 11  . doxazosin (CARDURA) 4 MG tablet Take 1 tablet (4 mg total) by mouth daily. 90 tablet 3  . ferrous sulfate 325 (65 FE) MG tablet Take 1 tablet (325 mg total) by mouth 2 (two) times daily with a meal. 60 tablet 11  . fluticasone (FLONASE) 50 MCG/ACT nasal spray Place 1 spray into both nostrils daily. 48 g  3  . glucose blood (ACCU-CHEK SMARTVIEW) test strip Check BS 4x daily due to fluctuating blood sugars - Fasting, before meals and QHS- Dx: E11.65 500 each 3  . HYDROcodone-acetaminophen (NORCO/VICODIN) 5-325 MG tablet Take 1 tablet by mouth every 6 (six) hours as needed for moderate pain. 56 tablet 0  . insulin aspart (NOVOLOG FLEXPEN) 100 UNIT/ML FlexPen 70-120=0    251-300=5 121-150=1  301-350=7 151-200=2  351-400=9 201-250=3  400 or >, call MD  units of insulin 45 mL 3  . Insulin Glargine (LANTUS SOLOSTAR) 100 UNIT/ML Solostar Pen INJECT 20 UNITS SUBCUTANEOUSLY TWICE DAILY (Patient taking differently: Inject 20 Units into the skin daily. INJECT 20 UNITS SUBCUTANEOUSLY TWICE DAILY) 45 mL 3  . losartan (COZAAR) 25 MG tablet 2 tab po qd  2  . methocarbamol (ROBAXIN) 500 MG tablet Take 1 po q8hrs prn muscle spasms 20 tablet 0  . metoprolol tartrate (LOPRESSOR) 25 MG tablet TAKE 1 TABLET TWICE DAILY 180 tablet 1  . nitroGLYCERIN (NITROSTAT) 0.4 MG SL tablet Place 1 tablet (0.4 mg total) under the tongue every 5 (five) minutes as needed for chest pain. 25 tablet 2  . oxyCODONE-acetaminophen (PERCOCET/ROXICET) 5-325 MG tablet Take 1 tablet by mouth every 6 (six) hours as needed for severe pain. 36 tablet 0  . pantoprazole (  PROTONIX) 40 MG tablet TAKE 1 TABLET EVERY DAY 90 tablet 1  . simvastatin (ZOCOR) 20 MG tablet TAKE 1 TABLET EVERY DAY 90 tablet 1  . torsemide (DEMADEX) 20 MG tablet TAKE 1 TABLET TWICE DAILY 180 tablet 3  . traZODone (DESYREL) 50 MG tablet TAKE 1 TABLET AT BEDTIME 90 tablet 1   No current facility-administered medications on file prior to visit.    Allergies  Allergen Reactions  . Aspirin Other (See Comments)    High doses caused stomach bleeds   Social History   Social History  . Marital status: Widowed    Spouse name: N/A  . Number of children: N/A  . Years of education: N/A   Occupational History  . Not on file.   Social History Main Topics  . Smoking status:  Never Smoker  . Smokeless tobacco: Never Used  . Alcohol use No  . Drug use: No  . Sexual activity: No   Other Topics Concern  . Not on file   Social History Narrative  . No narrative on file   No family history on file.    Review of Systems  All other systems reviewed and are negative.      Objective:   Physical Exam  Constitutional: She is oriented to person, place, and time. She appears well-developed and well-nourished. No distress.  HENT:  Head: Normocephalic and atraumatic.  Right Ear: External ear normal.  Left Ear: External ear normal.  Nose: Nose normal.  Mouth/Throat: Oropharynx is clear and moist.  Eyes: Conjunctivae and EOM are normal. Pupils are equal, round, and reactive to light. Right eye exhibits no discharge. Left eye exhibits no discharge. No scleral icterus.  Neck: Normal range of motion. Neck supple. No JVD present. No tracheal deviation present. No thyromegaly present.  Cardiovascular: Normal rate, regular rhythm, normal heart sounds and intact distal pulses.  Exam reveals no gallop and no friction rub.   No murmur heard. Pulmonary/Chest: Effort normal and breath sounds normal. No stridor. No respiratory distress. She has no wheezes. She has no rales. She exhibits no tenderness.  Abdominal: Soft. Bowel sounds are normal. She exhibits no distension and no mass. There is no tenderness. There is no rebound and no guarding.  Musculoskeletal: She exhibits no edema.  Lymphadenopathy:    She has no cervical adenopathy.  Neurological: She is alert and oriented to person, place, and time. She has normal reflexes. No cranial nerve deficit. She exhibits normal muscle tone. Coordination normal.  Skin: Skin is warm. No rash noted. She is not diaphoretic. No erythema. No pallor.  Psychiatric: She has a normal mood and affect. Her behavior is normal. Judgment and thought content normal.  Vitals reviewed.  Wt Readings from Last 3 Encounters:  03/15/16 165 lb (74.8  kg)  03/02/16 166 lb (75.3 kg)  12/25/15 169 lb (76.7 kg)         Assessment & Plan:     Frequent falls, orthostatic dizziness, gait instability.    Patient has a shuffling gait however I do not believe this is due to Parkinson disease but is rather because she feels unsteady on her feet. She walks like a patient who is walking on ice afraid to fall. I believe that she will benefit dramatically from physical therapy at home to improve her balance and her gait and help prevent future falls. I do not see signs of Parkinson's disease and therefore I do not see the role in a neurology consultation at this time.  Orthostatic vitals showed blood pressure of 120/56 with a heart rate of 54 lying down, sitting 118/50 with a heart rate of 56, and standing blood pressure is 112/52 with a heart rate of 60 showing no evidence of orthostatic hypotension. However the patient did become extremely dizzy from the lying to the sitting. Therefore I believe it would be beneficial to discontinue the Doxil Zosyn in case this is contributing at all to orthostatic dizziness.

## 2016-03-17 DIAGNOSIS — G4734 Idiopathic sleep related nonobstructive alveolar hypoventilation: Secondary | ICD-10-CM | POA: Diagnosis not present

## 2016-03-21 ENCOUNTER — Encounter: Payer: Self-pay | Admitting: Family Medicine

## 2016-03-28 ENCOUNTER — Other Ambulatory Visit: Payer: Self-pay | Admitting: Family Medicine

## 2016-03-28 ENCOUNTER — Ambulatory Visit (INDEPENDENT_AMBULATORY_CARE_PROVIDER_SITE_OTHER): Payer: Self-pay | Admitting: Orthopedic Surgery

## 2016-03-28 ENCOUNTER — Ambulatory Visit (INDEPENDENT_AMBULATORY_CARE_PROVIDER_SITE_OTHER): Payer: Medicare HMO

## 2016-03-28 DIAGNOSIS — S42302D Unspecified fracture of shaft of humerus, left arm, subsequent encounter for fracture with routine healing: Secondary | ICD-10-CM

## 2016-03-28 DIAGNOSIS — S42202D Unspecified fracture of upper end of left humerus, subsequent encounter for fracture with routine healing: Secondary | ICD-10-CM | POA: Diagnosis not present

## 2016-03-28 NOTE — Progress Notes (Signed)
Fracture care follow-up  Chief Complaint  Patient presents with  . Follow-up    Recheck left humerus fracture, DOI 12-24-15.    12 weeks status post left midshaft humerus fracture treated with fracture cuff  The fracture is healing nicely the brace is fitting well x-rays show callus formation around the fracture with minimal angulation patient will wear the brace for more weeks and x-ray out of brace with intent on leaving the brace off

## 2016-03-28 NOTE — Patient Instructions (Signed)
Continue brace 4 weeks repeat x-ray in 4 weeks

## 2016-03-29 DIAGNOSIS — S42301D Unspecified fracture of shaft of humerus, right arm, subsequent encounter for fracture with routine healing: Secondary | ICD-10-CM | POA: Diagnosis not present

## 2016-03-29 DIAGNOSIS — R296 Repeated falls: Secondary | ICD-10-CM | POA: Diagnosis not present

## 2016-03-29 DIAGNOSIS — I509 Heart failure, unspecified: Secondary | ICD-10-CM | POA: Diagnosis not present

## 2016-03-29 DIAGNOSIS — I2510 Atherosclerotic heart disease of native coronary artery without angina pectoris: Secondary | ICD-10-CM | POA: Diagnosis not present

## 2016-03-29 DIAGNOSIS — I110 Hypertensive heart disease with heart failure: Secondary | ICD-10-CM | POA: Diagnosis not present

## 2016-03-29 DIAGNOSIS — E113599 Type 2 diabetes mellitus with proliferative diabetic retinopathy without macular edema, unspecified eye: Secondary | ICD-10-CM | POA: Diagnosis not present

## 2016-03-30 ENCOUNTER — Telehealth: Payer: Self-pay | Admitting: Family Medicine

## 2016-03-30 ENCOUNTER — Telehealth: Payer: Self-pay | Admitting: Orthopedic Surgery

## 2016-03-30 NOTE — Telephone Encounter (Signed)
[facility] calling.  Needs verbal approval for Physical Therapy 2 x week x 6 weeks Safety steps Program.  Also to add Occupational Therapy to help patient with shoulder mobility, bathing, dressing.  Approval given.  Need to send order somewhere for a 4 wheel Rolator walker w/seat.  This would help her the most.

## 2016-03-30 NOTE — Telephone Encounter (Signed)
Are we Reading the note the note says that the brace will continue for 4 weeks  As far as the range of motion goes passive and active assisted range of motion shoulder elbow wrist and hand

## 2016-03-30 NOTE — Telephone Encounter (Signed)
ROUTING TO DR HARRISON TO ADVISE 

## 2016-03-30 NOTE — Telephone Encounter (Signed)
Claudia [name] left message stating that she needed orders clarified on this patient. Range of motion for Left Shoulder, can they do more with the left shoulder. Also, does the patient need to continue to wear the brace.  [phone]  Please call and advise

## 2016-03-31 DIAGNOSIS — I2510 Atherosclerotic heart disease of native coronary artery without angina pectoris: Secondary | ICD-10-CM | POA: Diagnosis not present

## 2016-03-31 DIAGNOSIS — R296 Repeated falls: Secondary | ICD-10-CM | POA: Diagnosis not present

## 2016-03-31 DIAGNOSIS — E113599 Type 2 diabetes mellitus with proliferative diabetic retinopathy without macular edema, unspecified eye: Secondary | ICD-10-CM | POA: Diagnosis not present

## 2016-03-31 DIAGNOSIS — I509 Heart failure, unspecified: Secondary | ICD-10-CM | POA: Diagnosis not present

## 2016-03-31 DIAGNOSIS — I110 Hypertensive heart disease with heart failure: Secondary | ICD-10-CM | POA: Diagnosis not present

## 2016-03-31 DIAGNOSIS — S42301D Unspecified fracture of shaft of humerus, right arm, subsequent encounter for fracture with routine healing: Secondary | ICD-10-CM | POA: Diagnosis not present

## 2016-03-31 NOTE — Telephone Encounter (Signed)
DEBBIE AWARE

## 2016-03-31 NOTE — Telephone Encounter (Signed)
Can we please send the order for the roller walker with seat.

## 2016-03-31 NOTE — Telephone Encounter (Signed)
Order for walker fax to [facility]

## 2016-04-04 ENCOUNTER — Telehealth: Payer: Self-pay | Admitting: Family Medicine

## 2016-04-04 ENCOUNTER — Telehealth: Payer: Self-pay | Admitting: Orthopedic Surgery

## 2016-04-04 NOTE — Telephone Encounter (Signed)
Call from [facility], occupational therapist, requests verbal orders for occupational therapy for left shoulder - states patient had been receiving as well as physical therapy. Asking for occupational therapy:  1 x per week for first week, then 2 x per week for 6 weeks.  Also needs to know if to include shoulder flexion as well as abduction.  In addition, asking for shoulder ADL's and IADL's. Please call her at [phone].

## 2016-04-04 NOTE — Telephone Encounter (Signed)
verbal permission for home health occupational therapy, contact [facility]ory [phone]

## 2016-04-04 NOTE — Telephone Encounter (Signed)
ROUTING TO DR HARRISON TO ADVISE 

## 2016-04-04 NOTE — Telephone Encounter (Signed)
No more orders other than what was stated

## 2016-04-05 ENCOUNTER — Other Ambulatory Visit: Payer: Self-pay | Admitting: Family Medicine

## 2016-04-05 DIAGNOSIS — Z780 Asymptomatic menopausal state: Secondary | ICD-10-CM

## 2016-04-05 DIAGNOSIS — I110 Hypertensive heart disease with heart failure: Secondary | ICD-10-CM | POA: Diagnosis not present

## 2016-04-05 DIAGNOSIS — I2510 Atherosclerotic heart disease of native coronary artery without angina pectoris: Secondary | ICD-10-CM | POA: Diagnosis not present

## 2016-04-05 DIAGNOSIS — E113599 Type 2 diabetes mellitus with proliferative diabetic retinopathy without macular edema, unspecified eye: Secondary | ICD-10-CM | POA: Diagnosis not present

## 2016-04-05 DIAGNOSIS — S42301D Unspecified fracture of shaft of humerus, right arm, subsequent encounter for fracture with routine healing: Secondary | ICD-10-CM | POA: Diagnosis not present

## 2016-04-05 DIAGNOSIS — R296 Repeated falls: Secondary | ICD-10-CM | POA: Diagnosis not present

## 2016-04-05 DIAGNOSIS — I509 Heart failure, unspecified: Secondary | ICD-10-CM | POA: Diagnosis not present

## 2016-04-05 NOTE — Telephone Encounter (Signed)
Verbal order given  

## 2016-04-06 DIAGNOSIS — E113599 Type 2 diabetes mellitus with proliferative diabetic retinopathy without macular edema, unspecified eye: Secondary | ICD-10-CM | POA: Diagnosis not present

## 2016-04-06 DIAGNOSIS — I509 Heart failure, unspecified: Secondary | ICD-10-CM | POA: Diagnosis not present

## 2016-04-06 DIAGNOSIS — S42301D Unspecified fracture of shaft of humerus, right arm, subsequent encounter for fracture with routine healing: Secondary | ICD-10-CM | POA: Diagnosis not present

## 2016-04-06 DIAGNOSIS — I110 Hypertensive heart disease with heart failure: Secondary | ICD-10-CM | POA: Diagnosis not present

## 2016-04-06 DIAGNOSIS — I2510 Atherosclerotic heart disease of native coronary artery without angina pectoris: Secondary | ICD-10-CM | POA: Diagnosis not present

## 2016-04-06 DIAGNOSIS — R296 Repeated falls: Secondary | ICD-10-CM | POA: Diagnosis not present

## 2016-04-07 NOTE — Telephone Encounter (Signed)
Therapist aware 

## 2016-04-08 DIAGNOSIS — R296 Repeated falls: Secondary | ICD-10-CM | POA: Diagnosis not present

## 2016-04-08 DIAGNOSIS — S42301D Unspecified fracture of shaft of humerus, right arm, subsequent encounter for fracture with routine healing: Secondary | ICD-10-CM | POA: Diagnosis not present

## 2016-04-08 DIAGNOSIS — I110 Hypertensive heart disease with heart failure: Secondary | ICD-10-CM | POA: Diagnosis not present

## 2016-04-08 DIAGNOSIS — I509 Heart failure, unspecified: Secondary | ICD-10-CM | POA: Diagnosis not present

## 2016-04-08 DIAGNOSIS — E113599 Type 2 diabetes mellitus with proliferative diabetic retinopathy without macular edema, unspecified eye: Secondary | ICD-10-CM | POA: Diagnosis not present

## 2016-04-08 DIAGNOSIS — I2510 Atherosclerotic heart disease of native coronary artery without angina pectoris: Secondary | ICD-10-CM | POA: Diagnosis not present

## 2016-04-11 DIAGNOSIS — R296 Repeated falls: Secondary | ICD-10-CM | POA: Diagnosis not present

## 2016-04-11 DIAGNOSIS — R2681 Unsteadiness on feet: Secondary | ICD-10-CM | POA: Diagnosis not present

## 2016-04-13 DIAGNOSIS — I509 Heart failure, unspecified: Secondary | ICD-10-CM | POA: Diagnosis not present

## 2016-04-13 DIAGNOSIS — R296 Repeated falls: Secondary | ICD-10-CM | POA: Diagnosis not present

## 2016-04-13 DIAGNOSIS — E113599 Type 2 diabetes mellitus with proliferative diabetic retinopathy without macular edema, unspecified eye: Secondary | ICD-10-CM | POA: Diagnosis not present

## 2016-04-13 DIAGNOSIS — I110 Hypertensive heart disease with heart failure: Secondary | ICD-10-CM | POA: Diagnosis not present

## 2016-04-13 DIAGNOSIS — I2510 Atherosclerotic heart disease of native coronary artery without angina pectoris: Secondary | ICD-10-CM | POA: Diagnosis not present

## 2016-04-13 DIAGNOSIS — S42301D Unspecified fracture of shaft of humerus, right arm, subsequent encounter for fracture with routine healing: Secondary | ICD-10-CM | POA: Diagnosis not present

## 2016-04-14 ENCOUNTER — Ambulatory Visit (HOSPITAL_COMMUNITY)
Admission: RE | Admit: 2016-04-14 | Discharge: 2016-04-14 | Disposition: A | Discharge status: Home / Self Care | Payer: Medicare HMO | Source: Ambulatory Visit | Attending: Family Medicine | Admitting: Family Medicine

## 2016-04-14 DIAGNOSIS — Z780 Asymptomatic menopausal state: Secondary | ICD-10-CM | POA: Diagnosis not present

## 2016-04-14 DIAGNOSIS — M810 Age-related osteoporosis without current pathological fracture: Secondary | ICD-10-CM | POA: Diagnosis not present

## 2016-04-15 ENCOUNTER — Encounter: Payer: Self-pay | Admitting: Family Medicine

## 2016-04-15 ENCOUNTER — Ambulatory Visit (INDEPENDENT_AMBULATORY_CARE_PROVIDER_SITE_OTHER): Payer: Medicare HMO | Admitting: Family Medicine

## 2016-04-15 VITALS — BP 120/54 | HR 52 | Temp 98.0°F | Resp 20 | Ht 67.0 in | Wt 162.0 lb

## 2016-04-15 DIAGNOSIS — I2510 Atherosclerotic heart disease of native coronary artery without angina pectoris: Secondary | ICD-10-CM | POA: Diagnosis not present

## 2016-04-15 DIAGNOSIS — E1121 Type 2 diabetes mellitus with diabetic nephropathy: Secondary | ICD-10-CM | POA: Diagnosis not present

## 2016-04-15 DIAGNOSIS — S42301D Unspecified fracture of shaft of humerus, right arm, subsequent encounter for fracture with routine healing: Secondary | ICD-10-CM | POA: Diagnosis not present

## 2016-04-15 DIAGNOSIS — E113599 Type 2 diabetes mellitus with proliferative diabetic retinopathy without macular edema, unspecified eye: Secondary | ICD-10-CM | POA: Diagnosis not present

## 2016-04-15 DIAGNOSIS — I509 Heart failure, unspecified: Secondary | ICD-10-CM | POA: Diagnosis not present

## 2016-04-15 DIAGNOSIS — R296 Repeated falls: Secondary | ICD-10-CM

## 2016-04-15 DIAGNOSIS — K921 Melena: Secondary | ICD-10-CM | POA: Diagnosis not present

## 2016-04-15 DIAGNOSIS — I110 Hypertensive heart disease with heart failure: Secondary | ICD-10-CM | POA: Diagnosis not present

## 2016-04-15 LAB — CBC WITH DIFFERENTIAL/PLATELET
BASOS ABS: 38 {cells}/uL (ref 0–200)
Basophils Relative: 1 %
EOS ABS: 114 {cells}/uL (ref 15–500)
Eosinophils Relative: 3 %
HEMATOCRIT: 30.4 % — AB (ref 35.0–45.0)
HEMOGLOBIN: 9.8 g/dL — AB (ref 12.0–15.0)
LYMPHS ABS: 836 {cells}/uL — AB (ref 850–3900)
Lymphocytes Relative: 22 %
MCH: 29.1 pg (ref 27.0–33.0)
MCHC: 32.2 g/dL (ref 32.0–36.0)
MCV: 90.2 fL (ref 80.0–100.0)
MONO ABS: 456 {cells}/uL (ref 200–950)
MPV: 10.1 fL (ref 7.5–12.5)
Monocytes Relative: 12 %
Neutro Abs: 2356 cells/uL (ref 1500–7800)
Neutrophils Relative %: 62 %
Platelets: 182 10*3/uL (ref 140–400)
RBC: 3.37 MIL/uL — ABNORMAL LOW (ref 3.80–5.10)
RDW: 14.6 % (ref 11.0–15.0)
WBC: 3.8 10*3/uL (ref 3.8–10.8)

## 2016-04-15 LAB — COMPLETE METABOLIC PANEL WITH GFR
ALBUMIN: 3.8 g/dL (ref 3.6–5.1)
ALK PHOS: 110 U/L (ref 33–130)
ALT: 10 U/L (ref 6–29)
AST: 19 U/L (ref 10–35)
BILIRUBIN TOTAL: 0.7 mg/dL (ref 0.2–1.2)
BUN: 61 mg/dL — ABNORMAL HIGH (ref 7–25)
CO2: 23 mmol/L (ref 20–31)
CREATININE: 2.16 mg/dL — AB (ref 0.60–0.88)
Calcium: 8.7 mg/dL (ref 8.6–10.4)
Chloride: 104 mmol/L (ref 98–110)
GFR, EST AFRICAN AMERICAN: 24 mL/min — AB (ref >=60)
GFR, EST NON AFRICAN AMERICAN: 21 mL/min — AB (ref >=60)
Glucose, Bld: 154 mg/dL — ABNORMAL HIGH (ref 70–99)
Potassium: 4.3 mmol/L (ref 3.5–5.3)
Sodium: 137 mmol/L (ref 135–146)
TOTAL PROTEIN: 6.9 g/dL (ref 6.1–8.1)

## 2016-04-15 NOTE — Progress Notes (Signed)
Subjective:    Patient ID: Claudia [name], female    DOB: [birth year], 83 y.o.   MRN: [id number]  HPI 08/2014 Patient is here today for complete physical exam. She looks much better from the last time I saw her. She is now able to stand up from a seated position just holding onto the counter. This is a marked improvement from when she was wheelchair-bound just a month ago. She is more alert. Her memory has improved. She is exercising around the house by walking with a walker. She has not had a urinary tract infection and almost a month. Overall she seems to be improving dramatically. She is still very weak and she still requires a walker to ambulate but again I cannot emphasize how much improvement she is demonstrating from her last visit. We had a long discussion today regarding cancer screening. Just earlier this year I was discussing hospice placement with this patient. Therefore I do not believe it makes medical sense to pursue cancer screening such as a mammogram, Pap smear, colonoscopy. Both the patient and her son are in agreement. She is due for Pneumovax 23 today. She will receive that. She has had Prevnar 13. Otherwise her preventative care is up-to-date.  At that time, my plan was: Physical exam is significant for several cancerous appearing lesions on her left cheek. One appears to be a basal cell cancer. The other appears to be a squamous cell cancer. Given their size and location, I will refer the patient to a dermatologist. Patient received Pneumovax 23 today. Her weight has stabilized and has actually improved 3 pounds. She is no longer rapidly losing weight. Her pulmonary edema has resolved. She does have some trace peripheral edema but this is good for this patient and is at her baseline. Her muscle strength and deconditioning appears to be improving. I will obtain a CBC, CMP, fasting lipid panel, hemoglobin A1c, and a urinalysis. Goal hemoglobin A1c is less than 8 given the patient's  frailty. At the present time on Lantus 18 units twice daily, her blood sugars are all less than 200 and I believe this is acceptable. I will check a urinalysis to rule out a urinary tract infection as this is seem to be the cause of the patient's decompensation recently and I want to be proactive about finding that prior to it causing her to decompensate.  12/26/14 Patient's weight has fallen to 164 pounds. She reports dry mouth as well as some orthostatic dizziness. Her blood pressure today is relatively low at 100/62. Her heart rate typically runs between 50 and 60 bpm. She is currently on metoprolol 12.5 mg by mouth twice a day as well as torsemide by mouth twice a day.  However the patient has done remarkably well since when I last saw her. She is now walking around her home without the use of a walker which I feel is a minor miracle given how decompensated she was earlier this year. At that time I was even considering hospice but the patient has rallied  And her muscle tone and strength has improved dramatically. Earlier last week she was even walking around [place] simply using a buggy for stability.  Her blood pressure typically ranges between [id number] systolic over [id number] diastolic.  Her blood sugars have been fluctuating between 70 and 200 although typically they're less than 170 which I feel is entirely appropriate given her age and comorbidities. Her last hemoglobin A1c was excellent at 7.1. She is due today  for her flu shot.  At that time, my plan was: Given her low blood pressure and her  Relative bradycardia, I will continue metoprolol 12.5 mg by mouth twice a day. However given her chronic kidney disease, or loss of weight, and her dry mouth, I would recommend decreasing torsemide to once a day. Her insulin seems appropriately dosed based on her blood sugars. She's been taking 10 units in the morning and 8 units in the evening. I have recommended just taking 18 units once a day for simplicity sake.  She also received her flu shot today. Recheck fasting lab work after the first of the year.  04/27/15 Since I last saw the patient, she has been doing exceptionally well. She is currently taking Lantus 20 units subcutaneous daily. She may use 1-2 units of NovoLog with meals depending upon what her sugars are. Her most recent hemoglobin A1c which was checked at her cardiologist's office was found to be 6.9. Her fasting blood sugars are under 130 and her 2 hour postprandial sugars are well below 180.  She denies any hypoglycemia. Her strength continues to improve. She is now able to walk around [place] with minimal assistance. She is able to walk out and get into her son's truck without assistance. This is dramatically improved from last time I saw her. Her weight remains stable at 167 pounds. She denies any chest pain shortness of breath or dyspnea on exertion. However her blood pressure has been elevated recently. Her cardiologist recently started her on losartan 25 mg by mouth daily. Her blood pressure continues to remain high at 160/62. However her creatinine recently increased as well since her cardiologist started her on losartan.  At that time, my plan was: I am very pleased with her blood sugars. I did ask her son to bring a copy of her lab work and so that I can look at her cholesterol, her CMP, fasting lipid panel, and her hemoglobin A1c but I would make no changes based on the numbers he is telling me. Her blood pressure is slightly elevated and therefore I also added amlodipine 5 mg by mouth daily to help control her blood pressure without exacerbating her chronic kidney disease. Recheck blood pressure in one month but otherwise I am ecstatic about how well her sugars are controlled on how well she is doing. I will make no changes in her medication at this time. Her goal LDL cholesterol is less than 70.  Because of her CKD, I believe she is a poor candidate for entresto.  10/15/15 Wt Readings from  Last 3 Encounters:  04/15/16 162 lb (73.5 kg)  03/15/16 165 lb (74.8 kg)  03/02/16 166 lb (75.3 kg)   Patient continues to do remarkably well. Her weight is essentially the same as it was in February. Unfortunately she is retaining a significant amount of fluid in her legs. She has +1 to +2 pitting edema to the knees in both legs. This is been going on for several months. It seems to coincide with when I put her on amlodipine for her blood pressure at the last visit. She is still taking her fluid pill. She is taking it twice a day on Mondays Wednesdays and Fridays and once a day on all other days. She denies any chest pain shortness of breath or dyspnea on exertion. Her fasting blood sugars are typically between 101 130. Her two-hour postprandial sugars are below 200 the vast majority of the time. Her blood pressure at home ranges  between 130 and 147/60-80. Overall I'm very happy with her numbers. She does report dysuria urgency and frequency for the last week or so. I cannot perform a urinalysis today is her lab is under [facility]. I can send a urine culture.  At that time, my plan was: Symptoms sound like a urinary tract infection. Begin Keflex 500 mg by mouth 3 times a day for 7 days. Send urine culture. Blood pressure at home is excellent but I will discontinue amlodipine because of the fluid retention. To compensate I will increase losartan to 50 mg a day. I will check her renal function today and check it again in one week when I recheck her blood pressure. I will also check a hemoglobin A1c. Given her age, hemoglobin A1c less than 7 would be outstanding. For the most part I believe we will be there based on the sugars that she is reporting. Increase fluid pill to torsemide 20 mg twice a day for the next week and then recheck in one week. At that time we will likely have her resume her previous dose of diuretic.  No visits with results within 1 Week(s) from this visit.  Latest known visit with  results is:  Abstract on 12/23/2015  Component Date Value Ref Range Status  . HM Diabetic Eye Exam 12/14/2015 Retinopathy* No Retinopathy Final   10/22/15 Renal function was worse, so I had the patient hold amlodipine, I did not increase losartan, and I did increase the lasix as planned.   Unfortunately, we were unable to reach the son with this information. Therefore she doubled her dose of Lasix and increase her losartan. The swelling in her legs is much better. They're still pitting edema around her ankles but not as prevalent as it was earlier this week. Her blood pressure is excellent. In fact is a little on the low side. She denies any chest pain or shortness of breath.  At that time, my plan was: Given her renal function I want him to decrease losartan back to 25 mg a day. Stay off the amlodipine as this is likely contributing to her swelling. Resume her previous dose of Lasix. Recheck her renal function. If worsening, we may need to hold losartan for short period of time. I encouraged the patient to drink 4-5 glasses of water a day  11/13/15 Ultimately, had to add cardura 2 mg poqday for HTN.  Here for follow up.  BP is elevated at home 150-160/80.  Weight is down to 164 lbs.  No residual edema.  At that time, my plan was: Up cardura to 4 mg a day and recheck bp in 2 weeks.  Repeat bmp and if renal fcn is declining, may need to reduce torsemide due to weight loss and dehydration.  03/15/16 Since I last saw the patient, she has fallen twice. In October she bent over to pick up something off the floor and lost her balance striking her left arm against a metal chair fracturing her left humerus. Recently she fell backward after standing up suffering a skin tear to the right forearm. She was seen in emergency room and the wound was dressed and bandaged with Steri-Strips. There is a long skin tear on the dorsal surface of her right forearm. However the skin edges were well approximated with Steri-Strips  and the tear has essentially healed except at the very medial and lateral margins. At the lateral margin there is an area the size of a quarter that is a superficial ulcer  showing underlying dermis total with a Tegaderm. At the medial margin there is a similar area the size of a dime which I covered with a nonadherent gauze and wrapped in Coban and. I recommended removing the dressing in 72 hours and I expect that the medial portion was healed at that point. They can remove the Tegaderm after 5 days. I believe this is all that this point will need. My biggest concern is wide as the patient keeps falling. I believe this is a combination of deconditioning, peripheral neuropathy, and possibly orthostatic dizziness. I will check orthostatic vitals today.  At that time, my plan was: Patient has a shuffling gait however I do not believe this is due to Parkinson disease but is rather because she feels unsteady on her feet. She walks like a patient who is walking on ice afraid to fall. I believe that she will benefit dramatically from physical therapy at home to improve her balance and her gait and help prevent future falls. I do not see signs of Parkinson's disease and therefore I do not see the role in a neurology consultation at this time. Orthostatic vitals showed blood pressure of 120/56 with a heart rate of 54 lying down, sitting 118/50 with a heart rate of 56, and standing blood pressure is 112/52 with a heart rate of 60 showing no evidence of orthostatic hypotension. However the patient did become extremely dizzy from the lying to the sitting. Therefore I believe it would be beneficial to discontinue the Doxazosin in case this is contributing at all to orthostatic dizziness.  04/15/16 Patient feels slightly better since discontinuing Cardura. However she continues to remain bradycardic with heart rate between 48 and 58. The home health care nurse reports a blood pressure of 100/60 with heart rate of 48 this  morning. Patient continues to have orthostatic dizziness and feels unsteady on her feet. Furthermore she reports several episodes of bright red blood per rectum at times it can be heavy. She has a history of hemorrhoids however the amount of blood that she describes sounds like it may be more from diverticulosis  Past Medical History:  Diagnosis Date  . Anemia   . Arthritis   . Cancer (HCC)    hx of skin cancer  . CHF (congestive heart failure) (HCC)    diastolic   . CKD (chronic kidney disease) stage 4, GFR 15-29 ml/min (HCC)   . Coronary artery disease   . Diabetes mellitus    insulin dependent  . GERD (gastroesophageal reflux disease)   . Headache(784.0)    rare migraines  . Hypercholesterolemia   . Hypertension   . Myocardial infarction 2013  . Osteoporosis   . Proliferative retinopathy due to DM ([place])   . PUD (peptic ulcer disease)   . S/P colonoscopy Sept 2011   left-sided diverticula, tubular adenoma  . S/P endoscopy Aug 2011   3 superficial gastric ulcers, NSAID-induced  . Shortness of breath   . SIADH (syndrome of inappropriate ADH production) ([place])    Past Surgical History:  Procedure Laterality Date  . APPENDECTOMY    . CARDIAC CATHETERIZATION  01/22/2013  . cardiac stents  07/19/2011  . COLONOSCOPY  12/04/09   normal rectum/left-sided diverticula  . CORONARY ANGIOPLASTY  07/2011  . CORONARY ARTERY BYPASS GRAFT N/A 11/08/2013   Procedure: CORONARY ARTERY BYPASS GRAFTING (CABG), on pump, times two, using left internal mammary artery, cryo saphenous vein.;  Surgeon: Ivin Poot, MD;  Location: [place];  Service: Open Heart  Surgery;  Laterality: N/A;  LIMA-LAD CRYOVEIN -OM  . ESOPHAGOGASTRODUODENOSCOPY  10/16/09   normal without barrett's/three superficial gastric ulcers  . EYE SURGERY     cataracts  . INTRAOPERATIVE TRANSESOPHAGEAL ECHOCARDIOGRAM N/A 11/08/2013   Procedure: INTRAOPERATIVE TRANSESOPHAGEAL ECHOCARDIOGRAM;  Surgeon: Ivin Poot, MD;  Location: [facility];   Service: Open Heart Surgery;  Laterality: N/A;  . JOINT REPLACEMENT  arthroscopy to knee  . LEFT HEART CATHETERIZATION WITH CORONARY ANGIOGRAM N/A 07/19/2011   Procedure: LEFT HEART CATHETERIZATION WITH CORONARY ANGIOGRAM;  Surgeon: Laverda Page, MD;  Location: [facility] CATH LAB;  Service: Cardiovascular;  Laterality: N/A;  . LEFT HEART CATHETERIZATION WITH CORONARY ANGIOGRAM N/A 12/21/2011   Procedure: LEFT HEART CATHETERIZATION WITH CORONARY ANGIOGRAM;  Surgeon: Laverda Page, MD;  Location: [facility] CATH LAB;  Service: Cardiovascular;  Laterality: N/A;  . LEFT HEART CATHETERIZATION WITH CORONARY ANGIOGRAM N/A 02/20/2012   Procedure: LEFT HEART CATHETERIZATION WITH CORONARY ANGIOGRAM;  Surgeon: Laverda Page, MD;  Location: [facility] CATH LAB;  Service: Cardiovascular;  Laterality: N/A;  . LEFT HEART CATHETERIZATION WITH CORONARY ANGIOGRAM N/A 09/03/2012   Procedure: LEFT HEART CATHETERIZATION WITH CORONARY ANGIOGRAM;  Surgeon: Laverda Page, MD;  Location: [facility] CATH LAB;  Service: Cardiovascular;  Laterality: N/A;  . LEFT HEART CATHETERIZATION WITH CORONARY ANGIOGRAM N/A 12/04/2012   Procedure: LEFT HEART CATHETERIZATION WITH CORONARY ANGIOGRAM;  Surgeon: Laverda Page, MD;  Location: [facility] CATH LAB;  Service: Cardiovascular;  Laterality: N/A;  . LEFT HEART CATHETERIZATION WITH CORONARY ANGIOGRAM N/A 01/22/2013   Procedure: LEFT HEART CATHETERIZATION WITH CORONARY ANGIOGRAM;  Surgeon: Laverda Page, MD;  Location: [facility] CATH LAB;  Service: Cardiovascular;  Laterality: N/A;  . LEFT HEART CATHETERIZATION WITH CORONARY ANGIOGRAM N/A 06/26/2013   Procedure: LEFT HEART CATHETERIZATION WITH CORONARY ANGIOGRAM;  Surgeon: Laverda Page, MD;  Location: [facility] CATH LAB;  Service: Cardiovascular;  Laterality: N/A;  . LEFT HEART CATHETERIZATION WITH CORONARY ANGIOGRAM N/A 11/05/2013   Procedure: LEFT HEART CATHETERIZATION WITH CORONARY ANGIOGRAM;  Surgeon: Laverda Page, MD;  Location: [facility] CATH LAB;  Service:  Cardiovascular;  Laterality: N/A;  . MITRAL VALVE REPLACEMENT N/A 11/08/2013   Procedure: MITRAL VALVE (MV) REPLACEMENT;  Surgeon: Ivin Poot, MD;  Location: [place];  Service: Open Heart Surgery;  Laterality: N/A;  #25 MAGNA MITRAL EASE  . PERCUTANEOUS CORONARY STENT INTERVENTION (PCI-S)  09/03/2012   Procedure: PERCUTANEOUS CORONARY STENT INTERVENTION (PCI-S);  Surgeon: Laverda Page, MD;  Location: [facility] CATH LAB;  Service: Cardiovascular;;  . TOE AMPUTATION Left 2013   left second toe amputation  . TONSILLECTOMY     Current Outpatient Prescriptions on File Prior to Visit  Medication Sig Dispense Refill  . ACCU-CHEK FASTCLIX LANCETS MISC USE  TO TEST BLOOD SUGAR FOUR TIMES DAILY FASTING, BEFORE MEALS  AND AT BEDTIME DUE TO FLUCTUATING BLOOD SUGARS 408 each 3  . ascorbic acid (VITAMIN C) 500 MG tablet Take 1 tablet (500 mg total) by mouth 2 (two) times daily. 60 tablet 11  . aspirin 81 MG chewable tablet Chew 2 tablets (162 mg total) by mouth daily. 60 tablet 11  . B-D ULTRAFINE III SHORT PEN 31G X 8 MM MISC 5 each by Does not apply route 5 (five) times daily. Use to inject insulin 5x daily 500 each 3  . cetirizine (ZYRTEC) 10 MG tablet Take 1 tablet (10 mg total) by mouth at bedtime. 30 tablet 11  . ferrous sulfate 325 (65 FE) MG tablet Take 1 tablet (325 mg total) by  mouth 2 (two) times daily with a meal. 60 tablet 11  . fluticasone (FLONASE) 50 MCG/ACT nasal spray Place 1 spray into both nostrils daily. 48 g 3  . glucose blood (ACCU-CHEK SMARTVIEW) test strip Check BS 4x daily due to fluctuating blood sugars - Fasting, before meals and QHS- Dx: E11.65 500 each 3  . insulin aspart (NOVOLOG FLEXPEN) 100 UNIT/ML FlexPen 70-120=0    251-300=5 121-150=1  301-350=7 151-200=2  351-400=9 201-250=3  400 or >, call MD  units of insulin 45 mL 3  . Insulin Glargine (LANTUS SOLOSTAR) 100 UNIT/ML Solostar Pen INJECT 20 UNITS SUBCUTANEOUSLY TWICE DAILY (Patient taking differently: Inject 20 Units into the  skin daily. INJECT 20 UNITS SUBCUTANEOUSLY TWICE DAILY) 45 mL 3  . losartan (COZAAR) 25 MG tablet 2 tab po qd  2  . methocarbamol (ROBAXIN) 500 MG tablet Take 1 po q8hrs prn muscle spasms 20 tablet 0  . metoprolol tartrate (LOPRESSOR) 25 MG tablet TAKE 1 TABLET TWICE DAILY 180 tablet 1  . nitroGLYCERIN (NITROSTAT) 0.4 MG SL tablet Place 1 tablet (0.4 mg total) under the tongue every 5 (five) minutes as needed for chest pain. 25 tablet 2  . pantoprazole (PROTONIX) 40 MG tablet TAKE 1 TABLET EVERY DAY 90 tablet 1  . simvastatin (ZOCOR) 20 MG tablet TAKE 1 TABLET EVERY DAY 90 tablet 1  . torsemide (DEMADEX) 20 MG tablet TAKE 1 TABLET TWICE DAILY 180 tablet 3  . traZODone (DESYREL) 50 MG tablet TAKE 1 TABLET AT BEDTIME 90 tablet 1  . doxazosin (CARDURA) 4 MG tablet Take 1 tablet (4 mg total) by mouth daily. (Patient not taking: Reported on 04/15/2016) 90 tablet 3   No current facility-administered medications on file prior to visit.    Allergies  Allergen Reactions  . Aspirin Other (See Comments)    High doses caused stomach bleeds   Social History   Social History  . Marital status: Widowed    Spouse name: N/A  . Number of children: N/A  . Years of education: N/A   Occupational History  . Not on file.   Social History Main Topics  . Smoking status: Never Smoker  . Smokeless tobacco: Never Used  . Alcohol use No  . Drug use: No  . Sexual activity: No   Other Topics Concern  . Not on file   Social History Narrative  . No narrative on file   No family history on file.    Review of Systems  All other systems reviewed and are negative.      Objective:   Physical Exam  Constitutional: She is oriented to person, place, and time. She appears well-developed and well-nourished. No distress.  HENT:  Head: Normocephalic and atraumatic.  Right Ear: External ear normal.  Left Ear: External ear normal.  Nose: Nose normal.  Mouth/Throat: Oropharynx is clear and moist.  Eyes:  Conjunctivae and EOM are normal. Pupils are equal, round, and reactive to light. Right eye exhibits no discharge. Left eye exhibits no discharge. No scleral icterus.  Neck: Normal range of motion. Neck supple. No JVD present. No tracheal deviation present. No thyromegaly present.  Cardiovascular: Normal rate, regular rhythm, normal heart sounds and intact distal pulses.  Exam reveals no gallop and no friction rub.   No murmur heard. Pulmonary/Chest: Effort normal and breath sounds normal. No stridor. No respiratory distress. She has no wheezes. She has no rales. She exhibits no tenderness.  Abdominal: Soft. Bowel sounds are normal. She exhibits no distension  and no mass. There is no tenderness. There is no rebound and no guarding.  Musculoskeletal: She exhibits no edema.  Lymphadenopathy:    She has no cervical adenopathy.  Neurological: She is alert and oriented to person, place, and time. She has normal reflexes. No cranial nerve deficit. She exhibits normal muscle tone. Coordination normal.  Skin: Skin is warm. No rash noted. She is not diaphoretic. No erythema. No pallor.  Psychiatric: She has a normal mood and affect. Her behavior is normal. Judgment and thought content normal.  Vitals reviewed.  Wt Readings from Last 3 Encounters:  04/15/16 162 lb (73.5 kg)  03/15/16 165 lb (74.8 kg)  03/02/16 166 lb (75.3 kg)         Assessment & Plan:   Controlled type 2 diabetes mellitus with diabetic nephropathy, without long-term current use of insulin ([place]) - Plan: CBC with Differential/Platelet, COMPLETE METABOLIC PANEL WITH GFR, Hemoglobin A1c  Frequent falls  Hematochezia I will check a CBC today to determine if the patient's becoming profoundly anemic due to the lower GI bleed. I will consult GI to see if she is a candidate for a sigmoidoscopy to determine the source of the bleeding and any potential treatment options. I'll have the patient temporarily discontinue metoprolol as her  bradycardia and hypotension may be also contributing to her dizziness and falls.

## 2016-04-16 LAB — HEMOGLOBIN A1C
Hgb A1c MFr Bld: 5.9 % — ABNORMAL HIGH (ref <5.7)
Mean Plasma Glucose: 123 mg/dL

## 2016-04-17 DIAGNOSIS — G4734 Idiopathic sleep related nonobstructive alveolar hypoventilation: Secondary | ICD-10-CM | POA: Diagnosis not present

## 2016-04-18 ENCOUNTER — Encounter: Payer: Self-pay | Admitting: Internal Medicine

## 2016-04-18 DIAGNOSIS — S42301D Unspecified fracture of shaft of humerus, right arm, subsequent encounter for fracture with routine healing: Secondary | ICD-10-CM | POA: Diagnosis not present

## 2016-04-18 DIAGNOSIS — I509 Heart failure, unspecified: Secondary | ICD-10-CM | POA: Diagnosis not present

## 2016-04-18 DIAGNOSIS — R296 Repeated falls: Secondary | ICD-10-CM | POA: Diagnosis not present

## 2016-04-18 DIAGNOSIS — E113599 Type 2 diabetes mellitus with proliferative diabetic retinopathy without macular edema, unspecified eye: Secondary | ICD-10-CM | POA: Diagnosis not present

## 2016-04-18 DIAGNOSIS — I2510 Atherosclerotic heart disease of native coronary artery without angina pectoris: Secondary | ICD-10-CM | POA: Diagnosis not present

## 2016-04-18 DIAGNOSIS — I110 Hypertensive heart disease with heart failure: Secondary | ICD-10-CM | POA: Diagnosis not present

## 2016-04-20 DIAGNOSIS — S42301D Unspecified fracture of shaft of humerus, right arm, subsequent encounter for fracture with routine healing: Secondary | ICD-10-CM | POA: Diagnosis not present

## 2016-04-20 DIAGNOSIS — R296 Repeated falls: Secondary | ICD-10-CM | POA: Diagnosis not present

## 2016-04-20 DIAGNOSIS — I509 Heart failure, unspecified: Secondary | ICD-10-CM | POA: Diagnosis not present

## 2016-04-20 DIAGNOSIS — I2510 Atherosclerotic heart disease of native coronary artery without angina pectoris: Secondary | ICD-10-CM | POA: Diagnosis not present

## 2016-04-20 DIAGNOSIS — I110 Hypertensive heart disease with heart failure: Secondary | ICD-10-CM | POA: Diagnosis not present

## 2016-04-20 DIAGNOSIS — E113599 Type 2 diabetes mellitus with proliferative diabetic retinopathy without macular edema, unspecified eye: Secondary | ICD-10-CM | POA: Diagnosis not present

## 2016-04-21 DIAGNOSIS — S42301D Unspecified fracture of shaft of humerus, right arm, subsequent encounter for fracture with routine healing: Secondary | ICD-10-CM | POA: Diagnosis not present

## 2016-04-21 DIAGNOSIS — R296 Repeated falls: Secondary | ICD-10-CM | POA: Diagnosis not present

## 2016-04-21 DIAGNOSIS — E113599 Type 2 diabetes mellitus with proliferative diabetic retinopathy without macular edema, unspecified eye: Secondary | ICD-10-CM | POA: Diagnosis not present

## 2016-04-21 DIAGNOSIS — I110 Hypertensive heart disease with heart failure: Secondary | ICD-10-CM | POA: Diagnosis not present

## 2016-04-21 DIAGNOSIS — I2510 Atherosclerotic heart disease of native coronary artery without angina pectoris: Secondary | ICD-10-CM | POA: Diagnosis not present

## 2016-04-21 DIAGNOSIS — I509 Heart failure, unspecified: Secondary | ICD-10-CM | POA: Diagnosis not present

## 2016-04-22 DIAGNOSIS — S42301D Unspecified fracture of shaft of humerus, right arm, subsequent encounter for fracture with routine healing: Secondary | ICD-10-CM | POA: Diagnosis not present

## 2016-04-22 DIAGNOSIS — E113599 Type 2 diabetes mellitus with proliferative diabetic retinopathy without macular edema, unspecified eye: Secondary | ICD-10-CM | POA: Diagnosis not present

## 2016-04-22 DIAGNOSIS — R296 Repeated falls: Secondary | ICD-10-CM | POA: Diagnosis not present

## 2016-04-22 DIAGNOSIS — I509 Heart failure, unspecified: Secondary | ICD-10-CM | POA: Diagnosis not present

## 2016-04-22 DIAGNOSIS — I2510 Atherosclerotic heart disease of native coronary artery without angina pectoris: Secondary | ICD-10-CM | POA: Diagnosis not present

## 2016-04-22 DIAGNOSIS — I110 Hypertensive heart disease with heart failure: Secondary | ICD-10-CM | POA: Diagnosis not present

## 2016-04-25 DIAGNOSIS — R296 Repeated falls: Secondary | ICD-10-CM | POA: Diagnosis not present

## 2016-04-25 DIAGNOSIS — S42301D Unspecified fracture of shaft of humerus, right arm, subsequent encounter for fracture with routine healing: Secondary | ICD-10-CM | POA: Diagnosis not present

## 2016-04-25 DIAGNOSIS — I2510 Atherosclerotic heart disease of native coronary artery without angina pectoris: Secondary | ICD-10-CM | POA: Diagnosis not present

## 2016-04-25 DIAGNOSIS — I110 Hypertensive heart disease with heart failure: Secondary | ICD-10-CM | POA: Diagnosis not present

## 2016-04-25 DIAGNOSIS — E113599 Type 2 diabetes mellitus with proliferative diabetic retinopathy without macular edema, unspecified eye: Secondary | ICD-10-CM | POA: Diagnosis not present

## 2016-04-25 DIAGNOSIS — I509 Heart failure, unspecified: Secondary | ICD-10-CM | POA: Diagnosis not present

## 2016-04-26 ENCOUNTER — Encounter: Payer: Self-pay | Admitting: Orthopedic Surgery

## 2016-04-26 ENCOUNTER — Ambulatory Visit (INDEPENDENT_AMBULATORY_CARE_PROVIDER_SITE_OTHER): Payer: Medicare HMO | Admitting: Orthopedic Surgery

## 2016-04-26 ENCOUNTER — Ambulatory Visit (HOSPITAL_COMMUNITY)
Admission: RE | Admit: 2016-04-26 | Discharge: 2016-04-26 | Disposition: A | Discharge status: Home / Self Care | Payer: Medicare HMO | Source: Ambulatory Visit | Attending: Orthopedic Surgery | Admitting: Orthopedic Surgery

## 2016-04-26 DIAGNOSIS — X58XXXD Exposure to other specified factors, subsequent encounter: Secondary | ICD-10-CM | POA: Insufficient documentation

## 2016-04-26 DIAGNOSIS — S42302D Unspecified fracture of shaft of humerus, left arm, subsequent encounter for fracture with routine healing: Secondary | ICD-10-CM

## 2016-04-26 DIAGNOSIS — M85822 Other specified disorders of bone density and structure, left upper arm: Secondary | ICD-10-CM | POA: Insufficient documentation

## 2016-04-26 DIAGNOSIS — S42302K Unspecified fracture of shaft of humerus, left arm, subsequent encounter for fracture with nonunion: Secondary | ICD-10-CM | POA: Diagnosis not present

## 2016-04-26 DIAGNOSIS — S42202A Unspecified fracture of upper end of left humerus, initial encounter for closed fracture: Secondary | ICD-10-CM | POA: Diagnosis not present

## 2016-04-26 NOTE — Progress Notes (Signed)
Chief Complaint  Patient presents with  . Follow-up    left humerus fracture, DOI 12-24-15   16+ weeks left humerus fracture treated with fracture cuff  Fracture shows callus in normal alignment. Fracture line still visible. Patient can lift her arm about 120 of flexion no radial nerve injury and no motion at the fracture site no tenderness at the fracture site  Fracture cuff repair removing with restrictions on weightbearing through the arm when sitting or getting up but she can weight-bear with a walker  Follow-up in 4 weeks continue therapy

## 2016-04-27 ENCOUNTER — Telehealth: Payer: Self-pay | Admitting: Family Medicine

## 2016-04-27 DIAGNOSIS — I2510 Atherosclerotic heart disease of native coronary artery without angina pectoris: Secondary | ICD-10-CM | POA: Diagnosis not present

## 2016-04-27 DIAGNOSIS — I110 Hypertensive heart disease with heart failure: Secondary | ICD-10-CM | POA: Diagnosis not present

## 2016-04-27 DIAGNOSIS — I509 Heart failure, unspecified: Secondary | ICD-10-CM | POA: Diagnosis not present

## 2016-04-27 DIAGNOSIS — E113599 Type 2 diabetes mellitus with proliferative diabetic retinopathy without macular edema, unspecified eye: Secondary | ICD-10-CM | POA: Diagnosis not present

## 2016-04-27 DIAGNOSIS — R296 Repeated falls: Secondary | ICD-10-CM | POA: Diagnosis not present

## 2016-04-27 DIAGNOSIS — S42301D Unspecified fracture of shaft of humerus, right arm, subsequent encounter for fracture with routine healing: Secondary | ICD-10-CM | POA: Diagnosis not present

## 2016-04-27 NOTE — Telephone Encounter (Signed)
Claudia [name] called and states that since stopping the Metoprolol the pt's pulse has been around 76.

## 2016-04-28 NOTE — Telephone Encounter (Signed)
ok 

## 2016-04-29 ENCOUNTER — Telehealth: Payer: Self-pay

## 2016-04-29 ENCOUNTER — Other Ambulatory Visit: Payer: Self-pay | Admitting: Family Medicine

## 2016-04-29 ENCOUNTER — Ambulatory Visit (INDEPENDENT_AMBULATORY_CARE_PROVIDER_SITE_OTHER): Payer: Medicare HMO | Admitting: Internal Medicine

## 2016-04-29 ENCOUNTER — Other Ambulatory Visit: Payer: Self-pay

## 2016-04-29 ENCOUNTER — Encounter: Payer: Self-pay | Admitting: Internal Medicine

## 2016-04-29 VITALS — BP 112/52 | HR 52 | Temp 97.8°F | Ht 66.0 in | Wt 156.4 lb

## 2016-04-29 DIAGNOSIS — D500 Iron deficiency anemia secondary to blood loss (chronic): Secondary | ICD-10-CM

## 2016-04-29 DIAGNOSIS — K921 Melena: Secondary | ICD-10-CM

## 2016-04-29 DIAGNOSIS — I509 Heart failure, unspecified: Secondary | ICD-10-CM | POA: Diagnosis not present

## 2016-04-29 DIAGNOSIS — I110 Hypertensive heart disease with heart failure: Secondary | ICD-10-CM | POA: Diagnosis not present

## 2016-04-29 DIAGNOSIS — R296 Repeated falls: Secondary | ICD-10-CM | POA: Diagnosis not present

## 2016-04-29 DIAGNOSIS — I2510 Atherosclerotic heart disease of native coronary artery without angina pectoris: Secondary | ICD-10-CM | POA: Diagnosis not present

## 2016-04-29 DIAGNOSIS — E113599 Type 2 diabetes mellitus with proliferative diabetic retinopathy without macular edema, unspecified eye: Secondary | ICD-10-CM | POA: Diagnosis not present

## 2016-04-29 DIAGNOSIS — S42301D Unspecified fracture of shaft of humerus, right arm, subsequent encounter for fracture with routine healing: Secondary | ICD-10-CM | POA: Diagnosis not present

## 2016-04-29 MED ORDER — PEG 3350-KCL-NA BICARB-NACL 420 G PO SOLR: 4000.0000 mL | ORAL | 0 refills | Status: DC

## 2016-04-29 NOTE — Patient Instructions (Signed)
Schedule a diagnostic colonoscopy with possible EGD to follow - conscious sedation  Clear liquids 1 1/2 days before procedure ; 4 liters of PEG day of procedure in am at short stay ; early to mid afternoon appt for actual procedure

## 2016-04-29 NOTE — Telephone Encounter (Signed)
TCS/+/-EGD with RMR scheduled for 05/26/16 at 2:15pm. Dr. Gala Romney advised 4 liters of PEG day of procedure in am at short stay. Dr. Gala Romney also advised: no enema, can have the Dulcolax tablets before going to hospital morning of procedure, Lantus 10 units BID the day before procedure, and Lantus 5 units morning of procedure. Noted on instructions.

## 2016-04-29 NOTE — Telephone Encounter (Signed)
Medication refilled per protocol. 

## 2016-04-29 NOTE — Patient Instructions (Signed)
NO PA needed for TCS/EGD ref# [id number]

## 2016-04-29 NOTE — Progress Notes (Signed)
  Primary Care Physician:  PICKARD,WARREN TOM, MD Primary Gastroenterologist:  Dr. Adayah Arocho  Pre-Procedure History & Physical: HPI:  Claudia [name] is a 83 y.o. female here for for evaluation of low volume painless hematochezia. Patient has noted bright red blood on occasion recently sometimes associated with stool and sometimes not. She denies abdominal pain. Denies melena. No upper GI tract symptoms such as odynophagia, dysphagia, early satiety;   reflux symptoms well controlled on Protonix. Last colonoscopy 2011-diverticulosis. Hemoglobin dropped down from 12-9.8 over several months.   Past Medical History:  Diagnosis Date  . Anemia   . Arthritis   . Cancer (HCC)    hx of skin cancer  . CHF (congestive heart failure) (HCC)    diastolic   . CKD (chronic kidney disease) stage 4, GFR 15-29 ml/min (HCC)   . Coronary artery disease   . Diabetes mellitus    insulin dependent  . GERD (gastroesophageal reflux disease)   . Headache(784.0)    rare migraines  . Hypercholesterolemia   . Hypertension   . Myocardial infarction 2013  . Osteoporosis   . Proliferative retinopathy due to DM (HCC)   . PUD (peptic ulcer disease)   . S/P colonoscopy Sept 2011   left-sided diverticula, tubular adenoma  . S/P endoscopy Aug 2011   3 superficial gastric ulcers, NSAID-induced  . Shortness of breath   . SIADH (syndrome of inappropriate ADH production) (HCC)     Past Surgical History:  Procedure Laterality Date  . APPENDECTOMY    . CARDIAC CATHETERIZATION  01/22/2013  . cardiac stents  07/19/2011  . COLONOSCOPY  12/04/09   normal rectum/left-sided diverticula  . CORONARY ANGIOPLASTY  07/2011  . CORONARY ARTERY BYPASS GRAFT N/A 11/08/2013   Procedure: CORONARY ARTERY BYPASS GRAFTING (CABG), on pump, times two, using left internal mammary artery, cryo saphenous vein.;  Surgeon: Peter Van Trigt, MD;  Location: [facility] OR;  Service: Open Heart Surgery;  Laterality: N/A;  LIMA-LAD CRYOVEIN -OM  .  ESOPHAGOGASTRODUODENOSCOPY  10/16/09   normal without barrett's/three superficial gastric ulcers  . EYE SURGERY     cataracts  . INTRAOPERATIVE TRANSESOPHAGEAL ECHOCARDIOGRAM N/A 11/08/2013   Procedure: INTRAOPERATIVE TRANSESOPHAGEAL ECHOCARDIOGRAM;  Surgeon: Peter Van Trigt, MD;  Location: [facility] OR;  Service: Open Heart Surgery;  Laterality: N/A;  . JOINT REPLACEMENT  arthroscopy to knee  . LEFT HEART CATHETERIZATION WITH CORONARY ANGIOGRAM N/A 07/19/2011   Procedure: LEFT HEART CATHETERIZATION WITH CORONARY ANGIOGRAM;  Surgeon: Jagadeesh R Ganji, MD;  Location: [facility];  Service: Cardiovascular;  Laterality: N/A;  . LEFT HEART CATHETERIZATION WITH CORONARY ANGIOGRAM N/A 12/21/2011   Procedure: LEFT HEART CATHETERIZATION WITH CORONARY ANGIOGRAM;  Surgeon: Jagadeesh R Ganji, MD;  Location: [facility];  Service: Cardiovascular;  Laterality: N/A;  . LEFT HEART CATHETERIZATION WITH CORONARY ANGIOGRAM N/A 02/20/2012   Procedure: LEFT HEART CATHETERIZATION WITH CORONARY ANGIOGRAM;  Surgeon: Jagadeesh R Ganji, MD;  Location: [facility];  Service: Cardiovascular;  Laterality: N/A;  . LEFT HEART CATHETERIZATION WITH CORONARY ANGIOGRAM N/A 09/03/2012   Procedure: LEFT HEART CATHETERIZATION WITH CORONARY ANGIOGRAM;  Surgeon: Jagadeesh R Ganji, MD;  Location: [facility];  Service: Cardiovascular;  Laterality: N/A;  . LEFT HEART CATHETERIZATION WITH CORONARY ANGIOGRAM N/A 12/04/2012   Procedure: LEFT HEART CATHETERIZATION WITH CORONARY ANGIOGRAM;  Surgeon: Jagadeesh R Ganji, MD;  Location: [facility];  Service: Cardiovascular;  Laterality: N/A;  . LEFT HEART CATHETERIZATION WITH CORONARY ANGIOGRAM N/A 01/22/2013   Procedure: LEFT HEART CATHETERIZATION WITH CORONARY ANGIOGRAM;    Surgeon: Jagadeesh R Ganji, MD;  Location: [facility];  Service: Cardiovascular;  Laterality: N/A;  . LEFT HEART CATHETERIZATION WITH CORONARY ANGIOGRAM N/A 06/26/2013   Procedure: LEFT HEART CATHETERIZATION WITH CORONARY ANGIOGRAM;   Surgeon: Jagadeesh R Ganji, MD;  Location: [facility];  Service: Cardiovascular;  Laterality: N/A;  . LEFT HEART CATHETERIZATION WITH CORONARY ANGIOGRAM N/A 11/05/2013   Procedure: LEFT HEART CATHETERIZATION WITH CORONARY ANGIOGRAM;  Surgeon: Jagadeesh R Ganji, MD;  Location: [facility];  Service: Cardiovascular;  Laterality: N/A;  . MITRAL VALVE REPLACEMENT N/A 11/08/2013   Procedure: MITRAL VALVE (MV) REPLACEMENT;  Surgeon: Peter Van Trigt, MD;  Location: [facility];  Service: Open Heart Surgery;  Laterality: N/A;  #25 MAGNA MITRAL EASE  . PERCUTANEOUS CORONARY STENT INTERVENTION (PCI-S)  09/03/2012   Procedure: PERCUTANEOUS CORONARY STENT INTERVENTION (PCI-S);  Surgeon: Jagadeesh R Ganji, MD;  Location: [facility];  Service: Cardiovascular;;  . TOE AMPUTATION Left 2013   left second toe amputation  . TONSILLECTOMY      Prior to Admission medications   Medication Sig Start Date End Date Taking? Authorizing Provider  ACCU-CHEK FASTCLIX LANCETS MISC USE  TO TEST BLOOD SUGAR FOUR TIMES DAILY FASTING, BEFORE MEALS  AND AT BEDTIME DUE TO FLUCTUATING BLOOD SUGARS 03/29/16   Warren T Pickard, MD  ascorbic acid (VITAMIN C) 500 MG tablet Take 1 tablet (500 mg total) by mouth 2 (two) times daily. 06/04/14   Warren T Pickard, MD  aspirin 81 MG chewable tablet Chew 2 tablets (162 mg total) by mouth daily. 06/04/14   Warren T Pickard, MD  B-D ULTRAFINE III SHORT PEN 31G X 8 MM MISC 5 each by Does not apply route 5 (five) times daily. Use to inject insulin 5x daily 04/16/15   Warren T Pickard, MD  cetirizine (ZYRTEC) 10 MG tablet Take 1 tablet (10 mg total) by mouth at bedtime. 06/04/14   Warren T Pickard, MD  doxazosin (CARDURA) 4 MG tablet Take 1 tablet (4 mg total) by mouth daily. 11/27/15   Warren T Pickard, MD  ferrous sulfate 325 (65 FE) MG tablet Take 1 tablet (325 mg total) by mouth 2 (two) times daily with a meal. 06/04/14   Warren T Pickard, MD  fluticasone (FLONASE) 50 MCG/ACT nasal spray Place 1 spray into  both nostrils daily. 04/16/15   Warren T Pickard, MD  glucose blood (ACCU-CHEK SMARTVIEW) test strip Check BS 4x daily due to fluctuating blood sugars - Fasting, before meals and QHS- Dx: E11.65 04/16/15   Warren T Pickard, MD  insulin aspart (NOVOLOG FLEXPEN) 100 UNIT/ML FlexPen 70-120=0    251-300=5 121-150=1  301-350=7 151-200=2  351-400=9 201-250=3  400 or >, call MD  units of insulin 04/16/15   Warren T Pickard, MD  Insulin Glargine (LANTUS SOLOSTAR) 100 UNIT/ML Solostar Pen INJECT 20 UNITS SUBCUTANEOUSLY TWICE DAILY Patient taking differently: Inject 20 Units into the skin daily. INJECT 20 UNITS SUBCUTANEOUSLY TWICE DAILY 04/16/15   Warren T Pickard, MD  losartan (COZAAR) 25 MG tablet 2 tab po qd 03/19/15   Historical Provider, MD  methocarbamol (ROBAXIN) 500 MG tablet Take 1 po q8hrs prn muscle spasms 12/25/15   Iva Knapp, MD  metoprolol tartrate (LOPRESSOR) 25 MG tablet TAKE 1 TABLET TWICE DAILY 10/29/15   Warren T Pickard, MD  nitroGLYCERIN (NITROSTAT) 0.4 MG SL tablet Place 1 tablet (0.4 mg total) under the tongue every 5 (five) minutes as needed for chest pain. 06/27/14   Warren T Pickard, MD  pantoprazole (  PROTONIX) 40 MG tablet TAKE 1 TABLET EVERY DAY 01/11/16   Warren T Pickard, MD  simvastatin (ZOCOR) 20 MG tablet TAKE 1 TABLET EVERY DAY 01/11/16   Warren T Pickard, MD  torsemide (DEMADEX) 20 MG tablet TAKE 1 TABLET TWICE DAILY 11/16/15   Warren T Pickard, MD  traMADol (ULTRAM) 50 MG tablet Take 50 mg by mouth every 6 (six) hours as needed.  03/15/16   Historical Provider, MD  traZODone (DESYREL) 50 MG tablet TAKE 1 TABLET AT BEDTIME 12/21/15   Warren T Pickard, MD    Allergies as of 04/29/2016 - Review Complete 04/15/2016  Allergen Reaction Noted  . Aspirin Other (See Comments) 10/06/2010    No family history on file.  Social History   Social History  . Marital status: Widowed    Spouse name: N/A  . Number of children: N/A  . Years of education: N/A   Occupational History  . Not on  file.   Social History Main Topics  . Smoking status: Never Smoker  . Smokeless tobacco: Never Used  . Alcohol use No  . Drug use: No  . Sexual activity: No   Other Topics Concern  . Not on file   Social History Narrative  . No narrative on file    Review of Systems: See HPI, otherwise negative ROS  Physical Exam: There were no vitals taken for this visit. General:   Alert,  Frail,  pleasant and cooperative in NAD Skin:  Intact without significant lesions or rashes. Neck:  Supple; no masses or thyromegaly. No significant cervical adenopathy. Lungs:  Clear throughout to auscultation.   No wheezes, crackles, or rhonchi. No acute distress. Heart:  Regular rate and rhythm; no murmurs, clicks, rubs,  or gallops. Abdomen: Non-distended, normal bowel sounds.  Soft and nontender without appreciable mass or hepatosplenomegaly.  Pulses:  Normal pulses noted. Extremities:  Without clubbing or edema. Rectal: Deferred until time of colonoscopy  Impression:  Pleasant 84-year-old lady with hematochezia and notable drop in her hemoglobin. No associated abdominal pain;  history of known diverticulosis. It's been a good 7 years since she last had her lower GI tract imaged. At this point, patient could have any number of other etiologies to be responsible for her rectal bleeding including benign causes such as hemorrhoids, polyp or other lesions. Whatever is causing rectal bleeding may or may not be responsible for the total drop in her hemoglobin.  With kidney failure, anemia of chronic disease may be a contributing factor as well.  Recommendations:  Dagnostic colonoscopy with upper endo-to follow if etiology of anemia and bleeding is not fully explained at the time of colonoscopy.  The risks, benefits, limitations, imponderables and alternatives regarding both EGD and colonoscopy have been reviewed with the patient. Questions have been answered. All parties agreeable.    Insulin regimen will be  adjusted during her prep      Notice: This dictation was prepared with Dragon dictation along with smaller phrase technology. Any transcriptional errors that result from this process are unintentional and may not be corrected upon review. 

## 2016-05-02 DIAGNOSIS — I2510 Atherosclerotic heart disease of native coronary artery without angina pectoris: Secondary | ICD-10-CM | POA: Diagnosis not present

## 2016-05-02 DIAGNOSIS — I110 Hypertensive heart disease with heart failure: Secondary | ICD-10-CM | POA: Diagnosis not present

## 2016-05-02 DIAGNOSIS — E113599 Type 2 diabetes mellitus with proliferative diabetic retinopathy without macular edema, unspecified eye: Secondary | ICD-10-CM | POA: Diagnosis not present

## 2016-05-02 DIAGNOSIS — R296 Repeated falls: Secondary | ICD-10-CM | POA: Diagnosis not present

## 2016-05-02 DIAGNOSIS — I509 Heart failure, unspecified: Secondary | ICD-10-CM | POA: Diagnosis not present

## 2016-05-02 DIAGNOSIS — S42301D Unspecified fracture of shaft of humerus, right arm, subsequent encounter for fracture with routine healing: Secondary | ICD-10-CM | POA: Diagnosis not present

## 2016-05-04 ENCOUNTER — Telehealth: Payer: Self-pay | Admitting: Family Medicine

## 2016-05-04 DIAGNOSIS — I2510 Atherosclerotic heart disease of native coronary artery without angina pectoris: Secondary | ICD-10-CM | POA: Diagnosis not present

## 2016-05-04 DIAGNOSIS — I509 Heart failure, unspecified: Secondary | ICD-10-CM | POA: Diagnosis not present

## 2016-05-04 DIAGNOSIS — R296 Repeated falls: Secondary | ICD-10-CM | POA: Diagnosis not present

## 2016-05-04 DIAGNOSIS — I110 Hypertensive heart disease with heart failure: Secondary | ICD-10-CM | POA: Diagnosis not present

## 2016-05-04 DIAGNOSIS — S42301D Unspecified fracture of shaft of humerus, right arm, subsequent encounter for fracture with routine healing: Secondary | ICD-10-CM | POA: Diagnosis not present

## 2016-05-04 DIAGNOSIS — E113599 Type 2 diabetes mellitus with proliferative diabetic retinopathy without macular edema, unspecified eye: Secondary | ICD-10-CM | POA: Diagnosis not present

## 2016-05-04 NOTE — Telephone Encounter (Signed)
Claudia [name] with [facility] called [facility] reporting that she checked pt's BP and it was 104/40 - P  - 52.

## 2016-05-06 DIAGNOSIS — I2510 Atherosclerotic heart disease of native coronary artery without angina pectoris: Secondary | ICD-10-CM | POA: Diagnosis not present

## 2016-05-06 DIAGNOSIS — I110 Hypertensive heart disease with heart failure: Secondary | ICD-10-CM | POA: Diagnosis not present

## 2016-05-06 DIAGNOSIS — E113599 Type 2 diabetes mellitus with proliferative diabetic retinopathy without macular edema, unspecified eye: Secondary | ICD-10-CM | POA: Diagnosis not present

## 2016-05-06 DIAGNOSIS — S42301D Unspecified fracture of shaft of humerus, right arm, subsequent encounter for fracture with routine healing: Secondary | ICD-10-CM | POA: Diagnosis not present

## 2016-05-06 DIAGNOSIS — R296 Repeated falls: Secondary | ICD-10-CM | POA: Diagnosis not present

## 2016-05-06 DIAGNOSIS — I509 Heart failure, unspecified: Secondary | ICD-10-CM | POA: Diagnosis not present

## 2016-05-09 ENCOUNTER — Telehealth: Payer: Self-pay | Admitting: Family Medicine

## 2016-05-09 NOTE — Telephone Encounter (Signed)
Roselie Awkward called and states that pt's pulse is on average is 69-76 and her BP is 116-122/58-65. You had taken her off of Metoprolol and he wanted to know if he should put her back on it or just leave her off of it?

## 2016-05-09 NOTE — Telephone Encounter (Signed)
Those numbers look good, I would just leave her off if she is feeling better.

## 2016-05-09 NOTE — Telephone Encounter (Signed)
Pt's son arnold aware

## 2016-05-11 DIAGNOSIS — E113599 Type 2 diabetes mellitus with proliferative diabetic retinopathy without macular edema, unspecified eye: Secondary | ICD-10-CM | POA: Diagnosis not present

## 2016-05-11 DIAGNOSIS — I110 Hypertensive heart disease with heart failure: Secondary | ICD-10-CM | POA: Diagnosis not present

## 2016-05-11 DIAGNOSIS — R296 Repeated falls: Secondary | ICD-10-CM | POA: Diagnosis not present

## 2016-05-11 DIAGNOSIS — I2510 Atherosclerotic heart disease of native coronary artery without angina pectoris: Secondary | ICD-10-CM | POA: Diagnosis not present

## 2016-05-11 DIAGNOSIS — I509 Heart failure, unspecified: Secondary | ICD-10-CM | POA: Diagnosis not present

## 2016-05-11 DIAGNOSIS — S42301D Unspecified fracture of shaft of humerus, right arm, subsequent encounter for fracture with routine healing: Secondary | ICD-10-CM | POA: Diagnosis not present

## 2016-05-13 DIAGNOSIS — R296 Repeated falls: Secondary | ICD-10-CM | POA: Diagnosis not present

## 2016-05-13 DIAGNOSIS — I2510 Atherosclerotic heart disease of native coronary artery without angina pectoris: Secondary | ICD-10-CM | POA: Diagnosis not present

## 2016-05-13 DIAGNOSIS — I509 Heart failure, unspecified: Secondary | ICD-10-CM | POA: Diagnosis not present

## 2016-05-13 DIAGNOSIS — S42301D Unspecified fracture of shaft of humerus, right arm, subsequent encounter for fracture with routine healing: Secondary | ICD-10-CM | POA: Diagnosis not present

## 2016-05-13 DIAGNOSIS — E113599 Type 2 diabetes mellitus with proliferative diabetic retinopathy without macular edema, unspecified eye: Secondary | ICD-10-CM | POA: Diagnosis not present

## 2016-05-13 DIAGNOSIS — I110 Hypertensive heart disease with heart failure: Secondary | ICD-10-CM | POA: Diagnosis not present

## 2016-05-15 DIAGNOSIS — G4734 Idiopathic sleep related nonobstructive alveolar hypoventilation: Secondary | ICD-10-CM | POA: Diagnosis not present

## 2016-05-18 ENCOUNTER — Telehealth: Payer: Self-pay | Admitting: Orthopedic Surgery

## 2016-05-18 NOTE — Telephone Encounter (Signed)
Call received from [facility], occupational therapist, [place] [facility] [place], requesting orders to extend therapy visits to one time per week for two weeks.  Direct ph# [phone]

## 2016-05-19 ENCOUNTER — Ambulatory Visit (INDEPENDENT_AMBULATORY_CARE_PROVIDER_SITE_OTHER): Payer: Medicare HMO | Admitting: Physician Assistant

## 2016-05-19 ENCOUNTER — Encounter: Payer: Self-pay | Admitting: Physician Assistant

## 2016-05-19 VITALS — BP 110/60 | HR 64 | Temp 98.0°F | Resp 14 | Wt 156.6 lb

## 2016-05-19 DIAGNOSIS — R0789 Other chest pain: Secondary | ICD-10-CM | POA: Diagnosis not present

## 2016-05-19 DIAGNOSIS — R05 Cough: Secondary | ICD-10-CM

## 2016-05-19 DIAGNOSIS — R1111 Vomiting without nausea: Secondary | ICD-10-CM

## 2016-05-19 DIAGNOSIS — R059 Cough, unspecified: Secondary | ICD-10-CM

## 2016-05-19 NOTE — Telephone Encounter (Signed)
Verbal okay given.  

## 2016-05-19 NOTE — Progress Notes (Signed)
Patient ID: Claudia [name] MRN: [id number], DOB: 03/20/1931, 83 y.o. Date of Encounter: 05/19/2016, 1:08 PM    Chief Complaint:  Chief Complaint  Patient presents with  . Cough  . right side of body pain    2wks  . Shortness of Breath    x2days  . Emesis    x1day     HPI: 83 y.o. year old female presents with above.   Her son accompanies her for visit.   I reviewed Dr. Samella Parr OV note from 04/15/2016, which also reviewed his prior visit notes back to 08/2014.   Her son reports that he called yesterday and scheduled this appointment because yesterday starting at 3 AM up until about noon time she vomited 3 or 4 times. He then called here and scheduled the visit.  However, says that she has not vomited anymore since noon time yesterday. Yesterday afternoon she ate a yogurt and ate her dinner last night. No further vomiting.   Above says "right side of body pain 2 weeks "---I asked about this---she points to her right chest as area where she has felt pain. Says that she felt a sharp pain here sometimes when she takes a deep breath. Says that when she does her exercises using her weights that she feels that sharp pain in the same area at that time as well. This is the only type of discomfort she has been feeling.  Regarding the cough she says that she thinks she's had a little bit more cough than usual just the last couple of days. Has not coughed up any phlegm.  During the visit she does have cough multiple times but it is a very dry hacky nonproductive cough. Does not sound congested.  Asked about the shortness of breath. Asked if she has been feeling more short of breath than usual. Asked when she has felt this.  she says that last Friday when she did physical therapy she "had to cut the exercise short because of breathing" Otherwise, has not been noticing any increased shortness of breath or orthopnea. I asked son if he had noted any Signs of increased shortness of breath but he has  not.      Home Meds:   Outpatient Medications Prior to Visit  Medication Sig Dispense Refill  . ACCU-CHEK FASTCLIX LANCETS MISC USE  TO TEST BLOOD SUGAR FOUR TIMES DAILY FASTING, BEFORE MEALS  AND AT BEDTIME DUE TO FLUCTUATING BLOOD SUGARS 408 each 3  . ascorbic acid (VITAMIN C) 500 MG tablet Take 1 tablet (500 mg total) by mouth 2 (two) times daily. 60 tablet 11  . aspirin 81 MG chewable tablet Chew 2 tablets (162 mg total) by mouth daily. 60 tablet 11  . B-D ULTRAFINE III SHORT PEN 31G X 8 MM MISC USE  TO INJECT  INSULIN  5  TIMES  DAILY 450 each 3  . cetirizine (ZYRTEC) 10 MG tablet Take 1 tablet (10 mg total) by mouth at bedtime. 30 tablet 11  . doxazosin (CARDURA) 4 MG tablet Take 1 tablet (4 mg total) by mouth daily. 90 tablet 3  . ferrous sulfate 325 (65 FE) MG tablet Take 1 tablet (325 mg total) by mouth 2 (two) times daily with a meal. 60 tablet 11  . fluticasone (FLONASE) 50 MCG/ACT nasal spray USE 1 SPRAY IN EACH NOSTRIL EVERY DAY 32 g 3  . glucose blood (ACCU-CHEK SMARTVIEW) test strip Check BS 4x daily due to fluctuating blood sugars - Fasting, before  meals and QHS- Dx: E11.65 500 each 3  . insulin aspart (NOVOLOG FLEXPEN) 100 UNIT/ML FlexPen 70-120=0    251-300=5 121-150=1  301-350=7 151-200=2  351-400=9 201-250=3  400 or >, call MD  units of insulin 45 mL 3  . Insulin Glargine (LANTUS SOLOSTAR) 100 UNIT/ML Solostar Pen INJECT 20 UNITS SUBCUTANEOUSLY TWICE DAILY (Patient taking differently: Inject 20 Units into the skin daily. INJECT 20 UNITS SUBCUTANEOUSLY DAILY) 45 mL 3  . losartan (COZAAR) 25 MG tablet 2 tab po qd  2  . methocarbamol (ROBAXIN) 500 MG tablet Take 1 po q8hrs prn muscle spasms 20 tablet 0  . nitroGLYCERIN (NITROSTAT) 0.4 MG SL tablet Place 1 tablet (0.4 mg total) under the tongue every 5 (five) minutes as needed for chest pain. 25 tablet 2  . pantoprazole (PROTONIX) 40 MG tablet TAKE 1 TABLET EVERY DAY 90 tablet 1  . polyethylene glycol-electrolytes (TRILYTE) 420  g solution Take 4,000 mLs by mouth as directed. 4000 mL 0  . simvastatin (ZOCOR) 20 MG tablet TAKE 1 TABLET EVERY DAY 90 tablet 1  . torsemide (DEMADEX) 20 MG tablet TAKE 1 TABLET TWICE DAILY 180 tablet 3  . traMADol (ULTRAM) 50 MG tablet Take 50 mg by mouth every 6 (six) hours as needed.     . traZODone (DESYREL) 50 MG tablet TAKE 1 TABLET AT BEDTIME 90 tablet 1  . metoprolol tartrate (LOPRESSOR) 25 MG tablet TAKE 1 TABLET TWICE DAILY (Patient not taking: Reported on 04/29/2016) 180 tablet 1   No facility-administered medications prior to visit.     Allergies:  Allergies  Allergen Reactions  . Aspirin Other (See Comments)    High doses caused stomach bleeds      Review of Systems: See HPI for pertinent ROS. All other ROS negative.    Physical Exam: Blood pressure 110/60, pulse 64, temperature 98 F (36.7 C), temperature source Oral, resp. rate 14, weight 156 lb 9.6 oz (71 kg), SpO2 99 %., Body mass index is 25.28 kg/m. General:  WF. Appears in no acute distress. HEENT: Normocephalic, atraumatic, eyes without discharge, sclera non-icteric, nares are without discharge. Bilateral auditory canals clear, TM's are without perforation, pearly grey and translucent with reflective cone of light bilaterally. Oral cavity moist, posterior pharynx without exudate, erythema.  Neck: Supple. No thyromegaly. No lymphadenopathy. Lungs: Clear bilaterally to auscultation without wheezes, rales, or rhonchi. Breathing is unlabored.  Heart: Regular rhythm. No murmurs, rubs, or gallops. Msk:  Strength and tone normal for age. She has area of tenderness with palpation of the right chest wall under the right breast.  Extremities/Skin:  Trace pedal edema.  Neuro: Alert and oriented X 3. Moves all extremities spontaneously. Gait is normal. CNII-XII grossly in tact. Psych:  Responds to questions appropriately with a normal affect.     ASSESSMENT AND PLAN:  83 y.o. year old female with    1. Vomiting  without nausea, intractability of vomiting not specified, unspecified vomiting type Resolved. Tolerating foods.  2. Chest wall pain Reassured her this is musculoskeletal pain secondary to her exercises/ weights.   3. Cough Lungs are clear.  Cough does not sound congested--sounds hacky. Recommend Zyrtec to dry up drainage. F/U if worsens.   Signed, [address] [place], [place], [facility] 05/19/2016 1:08 PM

## 2016-05-20 DIAGNOSIS — R296 Repeated falls: Secondary | ICD-10-CM | POA: Diagnosis not present

## 2016-05-20 DIAGNOSIS — E113599 Type 2 diabetes mellitus with proliferative diabetic retinopathy without macular edema, unspecified eye: Secondary | ICD-10-CM | POA: Diagnosis not present

## 2016-05-20 DIAGNOSIS — I2510 Atherosclerotic heart disease of native coronary artery without angina pectoris: Secondary | ICD-10-CM | POA: Diagnosis not present

## 2016-05-20 DIAGNOSIS — S42301D Unspecified fracture of shaft of humerus, right arm, subsequent encounter for fracture with routine healing: Secondary | ICD-10-CM | POA: Diagnosis not present

## 2016-05-20 DIAGNOSIS — I509 Heart failure, unspecified: Secondary | ICD-10-CM | POA: Diagnosis not present

## 2016-05-20 DIAGNOSIS — I110 Hypertensive heart disease with heart failure: Secondary | ICD-10-CM | POA: Diagnosis not present

## 2016-05-23 DIAGNOSIS — I110 Hypertensive heart disease with heart failure: Secondary | ICD-10-CM | POA: Diagnosis not present

## 2016-05-23 DIAGNOSIS — E113599 Type 2 diabetes mellitus with proliferative diabetic retinopathy without macular edema, unspecified eye: Secondary | ICD-10-CM | POA: Diagnosis not present

## 2016-05-23 DIAGNOSIS — S42301D Unspecified fracture of shaft of humerus, right arm, subsequent encounter for fracture with routine healing: Secondary | ICD-10-CM | POA: Diagnosis not present

## 2016-05-23 DIAGNOSIS — R296 Repeated falls: Secondary | ICD-10-CM | POA: Diagnosis not present

## 2016-05-23 DIAGNOSIS — I2510 Atherosclerotic heart disease of native coronary artery without angina pectoris: Secondary | ICD-10-CM | POA: Diagnosis not present

## 2016-05-23 DIAGNOSIS — I509 Heart failure, unspecified: Secondary | ICD-10-CM | POA: Diagnosis not present

## 2016-05-24 ENCOUNTER — Ambulatory Visit (INDEPENDENT_AMBULATORY_CARE_PROVIDER_SITE_OTHER): Payer: Medicare HMO | Admitting: Orthopedic Surgery

## 2016-05-24 ENCOUNTER — Encounter: Payer: Self-pay | Admitting: Orthopedic Surgery

## 2016-05-24 DIAGNOSIS — S42302D Unspecified fracture of shaft of humerus, left arm, subsequent encounter for fracture with routine healing: Secondary | ICD-10-CM

## 2016-05-24 NOTE — Progress Notes (Signed)
FOLLOW UP VISIT   Patient ID: Claudia [name], female   DOB: Jul 05, 1931, 83 y.o.   MRN: [id number]  Chief Complaint  Patient presents with  . Follow-up    LEFT HUMERUS FRACTURE, DOI 12/24/15    HPI Claudia [name] is a 83 y.o. female.   HPI  Close treatment of left humeral shaft fracture with fracture cuff. Patient has gone to physical therapy is here for follow-up visit No complaints,  Review of Systems Review of Systems  Neurological: Positive for weakness. Negative for numbness.     Physical Exam  Constitutional: She is oriented to person, place, and time. She appears well-developed and well-nourished. No distress.  Cardiovascular: Normal rate and intact distal pulses.   Neurological: She is alert and oriented to person, place, and time. She has normal reflexes. She exhibits normal muscle tone. Coordination normal.  Skin: Skin is warm and dry. No rash noted. She is not diaphoretic. No erythema. No pallor.  Psychiatric: She has a normal mood and affect. Her behavior is normal. Judgment and thought content normal.    Mild tenderness over the fracture callus   Active range of motion: Shoulder flexion 120. Shoulder abduction 90. Wrist extension and thumb extension normal  MEDICAL DECISION MAKING  Encounter Diagnosis  Name Primary?  . Closed fracture of shaft of left humerus with routine healing, unspecified fracture morphology, subsequent encounter Yes     PLAN(RISK)    Finished last visit physical therapy, No weightbearing Return as needed

## 2016-05-25 DIAGNOSIS — I2510 Atherosclerotic heart disease of native coronary artery without angina pectoris: Secondary | ICD-10-CM | POA: Diagnosis not present

## 2016-05-25 DIAGNOSIS — E113599 Type 2 diabetes mellitus with proliferative diabetic retinopathy without macular edema, unspecified eye: Secondary | ICD-10-CM | POA: Diagnosis not present

## 2016-05-25 DIAGNOSIS — R296 Repeated falls: Secondary | ICD-10-CM | POA: Diagnosis not present

## 2016-05-25 DIAGNOSIS — S42301D Unspecified fracture of shaft of humerus, right arm, subsequent encounter for fracture with routine healing: Secondary | ICD-10-CM | POA: Diagnosis not present

## 2016-05-25 DIAGNOSIS — I509 Heart failure, unspecified: Secondary | ICD-10-CM | POA: Diagnosis not present

## 2016-05-25 DIAGNOSIS — I110 Hypertensive heart disease with heart failure: Secondary | ICD-10-CM | POA: Diagnosis not present

## 2016-05-26 ENCOUNTER — Encounter (HOSPITAL_COMMUNITY): Payer: Self-pay

## 2016-05-26 ENCOUNTER — Ambulatory Visit (HOSPITAL_COMMUNITY)
Admission: RE | Admit: 2016-05-26 | Discharge: 2016-05-26 | Disposition: A | Discharge status: Home / Self Care | Payer: Medicare HMO | Source: Ambulatory Visit | Attending: Internal Medicine | Admitting: Internal Medicine

## 2016-05-26 ENCOUNTER — Encounter (HOSPITAL_COMMUNITY)
Admission: RE | Disposition: A | Discharge status: Home / Self Care | Payer: Self-pay | Source: Ambulatory Visit | Attending: Internal Medicine

## 2016-05-26 ENCOUNTER — Telehealth: Payer: Self-pay | Admitting: Family Medicine

## 2016-05-26 DIAGNOSIS — I5032 Chronic diastolic (congestive) heart failure: Secondary | ICD-10-CM | POA: Insufficient documentation

## 2016-05-26 DIAGNOSIS — K5730 Diverticulosis of large intestine without perforation or abscess without bleeding: Secondary | ICD-10-CM | POA: Insufficient documentation

## 2016-05-26 DIAGNOSIS — Z8719 Personal history of other diseases of the digestive system: Secondary | ICD-10-CM | POA: Diagnosis not present

## 2016-05-26 DIAGNOSIS — Z955 Presence of coronary angioplasty implant and graft: Secondary | ICD-10-CM | POA: Diagnosis not present

## 2016-05-26 DIAGNOSIS — Z85828 Personal history of other malignant neoplasm of skin: Secondary | ICD-10-CM | POA: Insufficient documentation

## 2016-05-26 DIAGNOSIS — Z7982 Long term (current) use of aspirin: Secondary | ICD-10-CM | POA: Insufficient documentation

## 2016-05-26 DIAGNOSIS — I252 Old myocardial infarction: Secondary | ICD-10-CM | POA: Insufficient documentation

## 2016-05-26 DIAGNOSIS — N184 Chronic kidney disease, stage 4 (severe): Secondary | ICD-10-CM | POA: Diagnosis not present

## 2016-05-26 DIAGNOSIS — K643 Fourth degree hemorrhoids: Secondary | ICD-10-CM | POA: Diagnosis not present

## 2016-05-26 DIAGNOSIS — M1990 Unspecified osteoarthritis, unspecified site: Secondary | ICD-10-CM | POA: Diagnosis not present

## 2016-05-26 DIAGNOSIS — Z8711 Personal history of peptic ulcer disease: Secondary | ICD-10-CM | POA: Insufficient documentation

## 2016-05-26 DIAGNOSIS — Z794 Long term (current) use of insulin: Secondary | ICD-10-CM | POA: Diagnosis not present

## 2016-05-26 DIAGNOSIS — M810 Age-related osteoporosis without current pathological fracture: Secondary | ICD-10-CM | POA: Insufficient documentation

## 2016-05-26 DIAGNOSIS — Z951 Presence of aortocoronary bypass graft: Secondary | ICD-10-CM | POA: Insufficient documentation

## 2016-05-26 DIAGNOSIS — I2510 Atherosclerotic heart disease of native coronary artery without angina pectoris: Secondary | ICD-10-CM | POA: Insufficient documentation

## 2016-05-26 DIAGNOSIS — K219 Gastro-esophageal reflux disease without esophagitis: Secondary | ICD-10-CM | POA: Diagnosis not present

## 2016-05-26 DIAGNOSIS — E1122 Type 2 diabetes mellitus with diabetic chronic kidney disease: Secondary | ICD-10-CM | POA: Insufficient documentation

## 2016-05-26 DIAGNOSIS — K921 Melena: Secondary | ICD-10-CM | POA: Diagnosis not present

## 2016-05-26 DIAGNOSIS — E7800 Pure hypercholesterolemia, unspecified: Secondary | ICD-10-CM | POA: Insufficient documentation

## 2016-05-26 DIAGNOSIS — I130 Hypertensive heart and chronic kidney disease with heart failure and stage 1 through stage 4 chronic kidney disease, or unspecified chronic kidney disease: Secondary | ICD-10-CM | POA: Diagnosis not present

## 2016-05-26 DIAGNOSIS — D122 Benign neoplasm of ascending colon: Secondary | ICD-10-CM

## 2016-05-26 DIAGNOSIS — Z79899 Other long term (current) drug therapy: Secondary | ICD-10-CM | POA: Insufficient documentation

## 2016-05-26 DIAGNOSIS — Z952 Presence of prosthetic heart valve: Secondary | ICD-10-CM | POA: Insufficient documentation

## 2016-05-26 DIAGNOSIS — E113599 Type 2 diabetes mellitus with proliferative diabetic retinopathy without macular edema, unspecified eye: Secondary | ICD-10-CM | POA: Insufficient documentation

## 2016-05-26 DIAGNOSIS — D500 Iron deficiency anemia secondary to blood loss (chronic): Secondary | ICD-10-CM

## 2016-05-26 HISTORY — PX: COLONOSCOPY: SHX5424

## 2016-05-26 LAB — GLUCOSE, CAPILLARY
GLUCOSE-CAPILLARY: 104 mg/dL — AB (ref 65–99)
GLUCOSE-CAPILLARY: 170 mg/dL — AB (ref 65–99)

## 2016-05-26 SURGERY — COLONOSCOPY
Anesthesia: Moderate Sedation

## 2016-05-26 MED ORDER — SODIUM CHLORIDE 0.9 % IV SOLN
INTRAVENOUS | Status: DC
  Administered 2016-05-26: 12:00:00 via INTRAVENOUS

## 2016-05-26 MED ORDER — LIDOCAINE VISCOUS 2 % MT SOLN
OROMUCOSAL | Status: AC
  Filled 2016-05-26: qty 15

## 2016-05-26 MED ORDER — ONDANSETRON HCL 4 MG/2ML IJ SOLN
INTRAMUSCULAR | Status: AC
  Filled 2016-05-26: qty 2

## 2016-05-26 MED ORDER — LIDOCAINE VISCOUS 2 % MT SOLN
OROMUCOSAL | Status: DC | PRN
Start: 2016-05-26 — End: 2016-05-26
  Administered 2016-05-26: 1 via OROMUCOSAL

## 2016-05-26 MED ORDER — ONDANSETRON HCL 4 MG/2ML IJ SOLN
INTRAMUSCULAR | Status: DC | PRN
Start: 2016-05-26 — End: 2016-05-26
  Administered 2016-05-26: 4 mg via INTRAVENOUS

## 2016-05-26 MED ORDER — PEG 3350-KCL-NA BICARB-NACL 420 G PO SOLR
4000.0000 mL | Freq: Once | ORAL | Status: AC
  Administered 2016-05-26: 4000 mL via ORAL
  Filled 2016-05-26: qty 4000

## 2016-05-26 MED ORDER — SIMETHICONE 40 MG/0.6ML PO SUSP
ORAL | Status: DC | PRN
  Administered 2016-05-26: 14:00:00

## 2016-05-26 MED ORDER — MIDAZOLAM HCL 5 MG/5ML IJ SOLN
INTRAMUSCULAR | Status: AC
  Filled 2016-05-26: qty 10

## 2016-05-26 MED ORDER — MEPERIDINE HCL 100 MG/ML IJ SOLN
INTRAMUSCULAR | Status: AC
  Filled 2016-05-26: qty 2

## 2016-05-26 MED ORDER — MIDAZOLAM HCL 5 MG/5ML IJ SOLN
INTRAMUSCULAR | Status: DC | PRN
  Administered 2016-05-26: 2 mg via INTRAVENOUS
  Administered 2016-05-26: 1 mg via INTRAVENOUS

## 2016-05-26 MED ORDER — MEPERIDINE HCL 100 MG/ML IJ SOLN
INTRAMUSCULAR | Status: DC | PRN
  Administered 2016-05-26: 25 mg via INTRAVENOUS

## 2016-05-26 NOTE — Op Note (Addendum)
[facility] Patient Name: Claudia [name] Procedure Date: 05/26/2016 1:47 PM MRN: [id number] Date of Birth: 14-Jul-1931 Attending MD: Norvel Richards , MD CSN: [id number] Age: 83 Admit Type: Outpatient Procedure:                Colonoscopy with snare polypectomy Indications:              Hematochezia Providers:                Norvel Richards, MD, Hinton Rao, RN, Aram Candela Referring MD:              Medicines:                Midazolam 3 mg IV, Meperidine 25 mg IV Complications:            No immediate complications. Estimated Blood Loss:     Estimated blood loss was minimal. Procedure:                Pre-Anesthesia Assessment:                           - Prior to the procedure, a History and Physical                            was performed, and patient medications and                            allergies were reviewed. The patient's tolerance of                            previous anesthesia was also reviewed. The risks                            and benefits of the procedure and the sedation                            options and risks were discussed with the patient.                            All questions were answered, and informed consent                            was obtained. Prior Anticoagulants: The patient has                            taken no previous anticoagulant or antiplatelet                            agents. ASA Grade Assessment: III - A patient with                            severe systemic disease. After reviewing the risks  and benefits, the patient was deemed in                            satisfactory condition to undergo the procedure.                           After obtaining informed consent, the colonoscope                            was passed under direct vision. Throughout the                            procedure, the patient's blood pressure, pulse, and   oxygen saturations were monitored continuously. The                            EC-[id number] ([id number]) scope was introduced through                            the anus and advanced to the the cecum, identified                            by appendiceal orifice and ileocecal valve. The                            ileocecal valve, appendiceal orifice, and rectum                            were photographed. The entire colon was well                            visualized. The patient tolerated the procedure                            well. The quality of the bowel preparation was                            adequate. Scope In: 2:16:10 PM Scope Out: 2:32:58 PM Scope Withdrawal Time: 0 hours 6 minutes 17 seconds  Total Procedure Duration: 0 hours 16 minutes 48 seconds  Findings:      .      The perianal exam was abnormal. Patient has a huge arcade of       circumferential grade 4 hemorrhoids protruding through the anal opening.       Please see photos.      Scattered small and large-mouthed diverticula were found in the sigmoid       colon and descending colon. Some excoriation circumferentially of the       distal rectum suspicious for partial rectal prolapse. Extensive sigmoid       and descending diverticula; (1) 6 mm sessile polyp in the ascending       segment, The polyp was removed with a cold snare. Resection and       retrieval were complete. Estimated blood loss was minimal. The remainder       of the colonic mucosa appeared normal.  A 6 mm polyp was found in the ascending colon. The polyp was sessile.       The polyp was removed with a cold snare. Resection and retrieval were       complete. Impression:               - Abnormal perianal exam. Grade 4                            hemorrhoids?likely source of hematochezia                           - Diverticulosis in the sigmoid colon and in the                            descending colon. An element of coexisting rectal                             prolapse not excluded. Patient likely bleeding from                            hemorrhoids. EGD not done. Hemorrhoids likely not                            going to improve with conservative measures. Moderate Sedation:      Moderate (conscious) sedation was administered by the endoscopy nurse       and supervised by the endoscopist. The following parameters were       monitored: oxygen saturation, heart rate, blood pressure, respiratory       rate, EKG, adequacy of pulmonary ventilation, and response to care.       Total physician intraservice time was 22 minutes. Recommendation:           - Patient has a contact number available for                            emergencies. The signs and symptoms of potential                            delayed complications were discussed with the                            patient. Return to normal activities tomorrow.                            Written discharge instructions were provided to the                            patient.                           - Resume previous diet.                           - Continue present medications.                           -  No repeat colonoscopy due to age. General surgery                            referral for hemorrhoidectomy.                           - Return to GI office (date not yet determined). Procedure Code(s):        --- Professional ---                           [phone], Colonoscopy, flexible; with removal of                            tumor(s), polyp(s), or other lesion(s) by snare                            technique                           99152, Moderate sedation services provided by the                            same physician or other qualified health care                            professional performing the diagnostic or                            therapeutic service that the sedation supports,                            requiring the presence of an independent trained                             observer to assist in the monitoring of the                            patient's level of consciousness and physiological                            status; initial 15 minutes of intraservice time,                            patient age 83 years or older Diagnosis Code(s):        --- Professional ---                           K92.1, Melena (includes Hematochezia)                           K57.30, Diverticulosis of large intestine without                            perforation or abscess without bleeding CPT copyright 2016 American [employer] Association. All rights reserved. The codes documented in  this report are preliminary and upon coder review may  be revised to meet current compliance requirements. Cristopher Estimable. Jenavive Lamboy, MD Norvel Richards, MD 05/26/2016 4:49:03 PM This report has been signed electronically. Number of Addenda: 0

## 2016-05-26 NOTE — Telephone Encounter (Signed)
Spoke to hh nurse and she was just wanting to make [name] aware of BP. Pt has been dc'd from program.

## 2016-05-26 NOTE — Interval H&P Note (Signed)
History and Physical Interval Note:  05/26/2016 2:04 PM  Claudia [name]  has presented today for surgery, with the diagnosis of hematochezia, anemia due to chronic blood loss  The various methods of treatment have been discussed with the patient and family. After consideration of risks, benefits and other options for treatment, the patient has consented to  Procedure(s) with comments: COLONOSCOPY (N/A) - 2:15pm ESOPHAGOGASTRODUODENOSCOPY (EGD) (N/A) as a surgical intervention .  The patient's history has been reviewed, patient examined, no change in status, stable for surgery.  I have reviewed the patient's chart and labs.  Questions were answered to the patient's satisfaction.     Robert Rourk  No change. Colonoscopy possible EGD to follow per plan.  The risks, benefits, limitations, imponderables and alternatives regarding both EGD and colonoscopy have been reviewed with the patient. Questions have been answered. All parties agreeable.

## 2016-05-26 NOTE — H&P (View-Only) (Signed)
Primary Care Physician:  Odette Fraction, MD Primary Gastroenterologist:  Dr. Gala Romney  Pre-Procedure History & Physical: HPI:  Claudia [name] is a 83 y.o. female here for for evaluation of low volume painless hematochezia. Patient has noted bright red blood on occasion recently sometimes associated with stool and sometimes not. She denies abdominal pain. Denies melena. No upper GI tract symptoms such as odynophagia, dysphagia, early satiety;   reflux symptoms well controlled on Protonix. Last colonoscopy 2011-diverticulosis. Hemoglobin dropped down from 12-9.8 over several months.   Past Medical History:  Diagnosis Date  . Anemia   . Arthritis   . Cancer (HCC)    hx of skin cancer  . CHF (congestive heart failure) (HCC)    diastolic   . CKD (chronic kidney disease) stage 4, GFR 15-29 ml/min (HCC)   . Coronary artery disease   . Diabetes mellitus    insulin dependent  . GERD (gastroesophageal reflux disease)   . Headache(784.0)    rare migraines  . Hypercholesterolemia   . Hypertension   . Myocardial infarction 2013  . Osteoporosis   . Proliferative retinopathy due to DM ([place])   . PUD (peptic ulcer disease)   . S/P colonoscopy Sept 2011   left-sided diverticula, tubular adenoma  . S/P endoscopy Aug 2011   3 superficial gastric ulcers, NSAID-induced  . Shortness of breath   . SIADH (syndrome of inappropriate ADH production) ([place])     Past Surgical History:  Procedure Laterality Date  . APPENDECTOMY    . CARDIAC CATHETERIZATION  01/22/2013  . cardiac stents  07/19/2011  . COLONOSCOPY  12/04/09   normal rectum/left-sided diverticula  . CORONARY ANGIOPLASTY  07/2011  . CORONARY ARTERY BYPASS GRAFT N/A 11/08/2013   Procedure: CORONARY ARTERY BYPASS GRAFTING (CABG), on pump, times two, using left internal mammary artery, cryo saphenous vein.;  Surgeon: Ivin Poot, MD;  Location: [place];  Service: Open Heart Surgery;  Laterality: N/A;  LIMA-LAD CRYOVEIN -OM  .  ESOPHAGOGASTRODUODENOSCOPY  10/16/09   normal without barrett's/three superficial gastric ulcers  . EYE SURGERY     cataracts  . INTRAOPERATIVE TRANSESOPHAGEAL ECHOCARDIOGRAM N/A 11/08/2013   Procedure: INTRAOPERATIVE TRANSESOPHAGEAL ECHOCARDIOGRAM;  Surgeon: Ivin Poot, MD;  Location: [place];  Service: Open Heart Surgery;  Laterality: N/A;  . JOINT REPLACEMENT  arthroscopy to knee  . LEFT HEART CATHETERIZATION WITH CORONARY ANGIOGRAM N/A 07/19/2011   Procedure: LEFT HEART CATHETERIZATION WITH CORONARY ANGIOGRAM;  Surgeon: Laverda Page, MD;  Location: [facility] CATH LAB;  Service: Cardiovascular;  Laterality: N/A;  . LEFT HEART CATHETERIZATION WITH CORONARY ANGIOGRAM N/A 12/21/2011   Procedure: LEFT HEART CATHETERIZATION WITH CORONARY ANGIOGRAM;  Surgeon: Laverda Page, MD;  Location: [facility] CATH LAB;  Service: Cardiovascular;  Laterality: N/A;  . LEFT HEART CATHETERIZATION WITH CORONARY ANGIOGRAM N/A 02/20/2012   Procedure: LEFT HEART CATHETERIZATION WITH CORONARY ANGIOGRAM;  Surgeon: Laverda Page, MD;  Location: [facility] CATH LAB;  Service: Cardiovascular;  Laterality: N/A;  . LEFT HEART CATHETERIZATION WITH CORONARY ANGIOGRAM N/A 09/03/2012   Procedure: LEFT HEART CATHETERIZATION WITH CORONARY ANGIOGRAM;  Surgeon: Laverda Page, MD;  Location: [facility] CATH LAB;  Service: Cardiovascular;  Laterality: N/A;  . LEFT HEART CATHETERIZATION WITH CORONARY ANGIOGRAM N/A 12/04/2012   Procedure: LEFT HEART CATHETERIZATION WITH CORONARY ANGIOGRAM;  Surgeon: Laverda Page, MD;  Location: [facility] CATH LAB;  Service: Cardiovascular;  Laterality: N/A;  . LEFT HEART CATHETERIZATION WITH CORONARY ANGIOGRAM N/A 01/22/2013   Procedure: LEFT HEART CATHETERIZATION WITH CORONARY ANGIOGRAM;  Surgeon: Laverda Page, MD;  Location: [facility] CATH LAB;  Service: Cardiovascular;  Laterality: N/A;  . LEFT HEART CATHETERIZATION WITH CORONARY ANGIOGRAM N/A 06/26/2013   Procedure: LEFT HEART CATHETERIZATION WITH CORONARY ANGIOGRAM;   Surgeon: Laverda Page, MD;  Location: [facility] CATH LAB;  Service: Cardiovascular;  Laterality: N/A;  . LEFT HEART CATHETERIZATION WITH CORONARY ANGIOGRAM N/A 11/05/2013   Procedure: LEFT HEART CATHETERIZATION WITH CORONARY ANGIOGRAM;  Surgeon: Laverda Page, MD;  Location: [facility] CATH LAB;  Service: Cardiovascular;  Laterality: N/A;  . MITRAL VALVE REPLACEMENT N/A 11/08/2013   Procedure: MITRAL VALVE (MV) REPLACEMENT;  Surgeon: Ivin Poot, MD;  Location: [place];  Service: Open Heart Surgery;  Laterality: N/A;  #25 MAGNA MITRAL EASE  . PERCUTANEOUS CORONARY STENT INTERVENTION (PCI-S)  09/03/2012   Procedure: PERCUTANEOUS CORONARY STENT INTERVENTION (PCI-S);  Surgeon: Laverda Page, MD;  Location: [facility] CATH LAB;  Service: Cardiovascular;;  . TOE AMPUTATION Left 2013   left second toe amputation  . TONSILLECTOMY      Prior to Admission medications   Medication Sig Start Date End Date Taking? Authorizing Provider  ACCU-CHEK FASTCLIX LANCETS MISC USE  TO TEST BLOOD SUGAR FOUR TIMES DAILY FASTING, BEFORE MEALS  AND AT BEDTIME DUE TO FLUCTUATING BLOOD SUGARS 03/29/16   Susy Frizzle, MD  ascorbic acid (VITAMIN C) 500 MG tablet Take 1 tablet (500 mg total) by mouth 2 (two) times daily. 06/04/14   Susy Frizzle, MD  aspirin 81 MG chewable tablet Chew 2 tablets (162 mg total) by mouth daily. 06/04/14   Susy Frizzle, MD  B-D ULTRAFINE III SHORT PEN 31G X 8 MM MISC 5 each by Does not apply route 5 (five) times daily. Use to inject insulin 5x daily 04/16/15   Susy Frizzle, MD  cetirizine (ZYRTEC) 10 MG tablet Take 1 tablet (10 mg total) by mouth at bedtime. 06/04/14   Susy Frizzle, MD  doxazosin (CARDURA) 4 MG tablet Take 1 tablet (4 mg total) by mouth daily. 11/27/15   Susy Frizzle, MD  ferrous sulfate 325 (65 FE) MG tablet Take 1 tablet (325 mg total) by mouth 2 (two) times daily with a meal. 06/04/14   Susy Frizzle, MD  fluticasone (FLONASE) 50 MCG/ACT nasal spray Place 1 spray into  both nostrils daily. 04/16/15   Susy Frizzle, MD  glucose blood (ACCU-CHEK SMARTVIEW) test strip Check BS 4x daily due to fluctuating blood sugars - Fasting, before meals and QHS- Dx: E11.65 04/16/15   Susy Frizzle, MD  insulin aspart (NOVOLOG FLEXPEN) 100 UNIT/ML FlexPen 70-120=0    251-300=5 121-150=1  301-350=7 151-200=2  351-400=9 201-250=3  400 or >, call MD  units of insulin 04/16/15   Susy Frizzle, MD  Insulin Glargine (LANTUS SOLOSTAR) 100 UNIT/ML Solostar Pen INJECT 20 UNITS SUBCUTANEOUSLY TWICE DAILY Patient taking differently: Inject 20 Units into the skin daily. INJECT 20 UNITS SUBCUTANEOUSLY TWICE DAILY 04/16/15   Susy Frizzle, MD  losartan (COZAAR) 25 MG tablet 2 tab po qd 03/19/15   Historical Provider, MD  methocarbamol (ROBAXIN) 500 MG tablet Take 1 po q8hrs prn muscle spasms 12/25/15   Rolland Porter, MD  metoprolol tartrate (LOPRESSOR) 25 MG tablet TAKE 1 TABLET TWICE DAILY 10/29/15   Susy Frizzle, MD  nitroGLYCERIN (NITROSTAT) 0.4 MG SL tablet Place 1 tablet (0.4 mg total) under the tongue every 5 (five) minutes as needed for chest pain. 06/27/14   Susy Frizzle, MD  pantoprazole (  PROTONIX) 40 MG tablet TAKE 1 TABLET EVERY DAY 01/11/16   Susy Frizzle, MD  simvastatin (ZOCOR) 20 MG tablet TAKE 1 TABLET EVERY DAY 01/11/16   Susy Frizzle, MD  torsemide (DEMADEX) 20 MG tablet TAKE 1 TABLET TWICE DAILY 11/16/15   Susy Frizzle, MD  traMADol (ULTRAM) 50 MG tablet Take 50 mg by mouth every 6 (six) hours as needed.  03/15/16   Historical Provider, MD  traZODone (DESYREL) 50 MG tablet TAKE 1 TABLET AT BEDTIME 12/21/15   Susy Frizzle, MD    Allergies as of 04/29/2016 - Review Complete 04/15/2016  Allergen Reaction Noted  . Aspirin Other (See Comments) 10/06/2010    No family history on file.  Social History   Social History  . Marital status: Widowed    Spouse name: N/A  . Number of children: N/A  . Years of education: N/A   Occupational History  . Not on  file.   Social History Main Topics  . Smoking status: Never Smoker  . Smokeless tobacco: Never Used  . Alcohol use No  . Drug use: No  . Sexual activity: No   Other Topics Concern  . Not on file   Social History Narrative  . No narrative on file    Review of Systems: See HPI, otherwise negative ROS  Physical Exam: There were no vitals taken for this visit. General:   Alert,  Frail,  pleasant and cooperative in NAD Skin:  Intact without significant lesions or rashes. Neck:  Supple; no masses or thyromegaly. No significant cervical adenopathy. Lungs:  Clear throughout to auscultation.   No wheezes, crackles, or rhonchi. No acute distress. Heart:  Regular rate and rhythm; no murmurs, clicks, rubs,  or gallops. Abdomen: Non-distended, normal bowel sounds.  Soft and nontender without appreciable mass or hepatosplenomegaly.  Pulses:  Normal pulses noted. Extremities:  Without clubbing or edema. Rectal: Deferred until time of colonoscopy  Impression:  Pleasant 83 year old lady with hematochezia and notable drop in her hemoglobin. No associated abdominal pain;  history of known diverticulosis. It's been a good 7 years since she last had her lower GI tract imaged. At this point, patient could have any number of other etiologies to be responsible for her rectal bleeding including benign causes such as hemorrhoids, polyp or other lesions. Whatever is causing rectal bleeding may or may not be responsible for the total drop in her hemoglobin.  With kidney failure, anemia of chronic disease may be a contributing factor as well.  Recommendations:  Dagnostic colonoscopy with upper endo-to follow if etiology of anemia and bleeding is not fully explained at the time of colonoscopy.  The risks, benefits, limitations, imponderables and alternatives regarding both EGD and colonoscopy have been reviewed with the patient. Questions have been answered. All parties agreeable.    Insulin regimen will be  adjusted during her prep      Notice: This dictation was prepared with Dragon dictation along with smaller phrase technology. Any transcriptional errors that result from this process are unintentional and may not be corrected upon review.

## 2016-05-26 NOTE — Discharge Instructions (Addendum)
Colonoscopy Discharge Instructions  Read the instructions outlined below and refer to this sheet in the next few weeks. These discharge instructions provide you with general information on caring for yourself after you leave the hospital. Your doctor may also give you specific instructions. While your treatment has been planned according to the most current medical practices available, unavoidable complications occasionally occur. If you have any problems or questions after discharge, call Dr. Gala Romney at [phone]. ACTIVITY  You may resume your regular activity, but move at a slower pace for the next 24 hours.   Take frequent rest periods for the next 24 hours.   Walking will help get rid of the air and reduce the bloated feeling in your belly (abdomen).   No driving for 24 hours (because of the medicine (anesthesia) used during the test).    Do not sign any important legal documents or operate any machinery for 24 hours (because of the anesthesia used during the test).  NUTRITION  Drink plenty of fluids.   You may resume your normal diet as instructed by your doctor.   Begin with a light meal and progress to your normal diet. Heavy or fried foods are harder to digest and may make you feel sick to your stomach (nauseated).   Avoid alcoholic beverages for 24 hours or as instructed.  MEDICATIONS  You may resume your normal medications unless your doctor tells you otherwise.  WHAT YOU CAN EXPECT TODAY  Some feelings of bloating in the abdomen.   Passage of more gas than usual.   Spotting of blood in your stool or on the toilet paper.  IF YOU HAD POLYPS REMOVED DURING THE COLONOSCOPY:  No aspirin products for 7 days or as instructed.   No alcohol for 7 days or as instructed.   Eat a soft diet for the next 24 hours.  FINDING OUT THE RESULTS OF YOUR TEST Not all test results are available during your visit. If your test results are not back during the visit, make an appointment  with your caregiver to find out the results. Do not assume everything is normal if you have not heard from your caregiver or the medical facility. It is important for you to follow up on all of your test results.  SEEK IMMEDIATE MEDICAL ATTENTION IF:  You have more than a spotting of blood in your stool.   Your belly is swollen (abdominal distention).   You are nauseated or vomiting.   You have a temperature over 101.   You have abdominal pain or discomfort that is severe or gets worse throughout the day.    Colon polyp, diverticulosis and hemorrhoid information provided  Your hemorrhoids or huge and likely the cause of your bleeding. You need to see a surgeon for consideration of hemorrhoidectomy  Further recommendations to follow pending review of pathology report     Colon Polyps Polyps are tissue growths inside the body. Polyps can grow in many places, including the large intestine (colon). A polyp may be a round bump or a mushroom-shaped growth. You could have one polyp or several. Most colon polyps are noncancerous (benign). However, some colon polyps can become cancerous over time. What are the causes? The exact cause of colon polyps is not known. What increases the risk? This condition is more likely to develop in people who:  Have a family history of colon cancer or colon polyps.  Are older than 34 or older than 45 if they are African American.  Have  inflammatory bowel disease, such as ulcerative colitis or Crohn disease.  Are overweight.  Smoke cigarettes.  Do not get enough exercise.  Drink too much alcohol.  Eat a diet that is:  High in fat and red meat.  Low in fiber.  Had childhood cancer that was treated with abdominal radiation. What are the signs or symptoms? Most polyps do not cause symptoms. If you have symptoms, they may include:  Blood coming from your rectum when having a bowel movement.  Blood in your stool.The stool may look dark red  or black.  A change in bowel habits, such as constipation or diarrhea. How is this diagnosed? This condition is diagnosed with a colonoscopy. This is a procedure that uses a lighted, flexible scope to look at the inside of your colon. How is this treated? Treatment for this condition involves removing any polyps that are found. Those polyps will then be tested for cancer. If cancer is found, your health care provider will talk to you about options for colon cancer treatment. Follow these instructions at home: Diet   Eat plenty of fiber, such as fruits, vegetables, and whole grains.  Eat foods that are high in calcium and vitamin D, such as milk, cheese, yogurt, eggs, liver, fish, and broccoli.  Limit foods high in fat, red meats, and processed meats, such as hot dogs, sausage, bacon, and lunch meats.  Maintain a healthy weight, or lose weight if recommended by your health care provider. General instructions   Do not smoke cigarettes.  Do not drink alcohol excessively.  Keep all follow-up visits as told by your health care provider. This is important. This includes keeping regularly scheduled colonoscopies. Talk to your health care provider about when you need a colonoscopy.  Exercise every day or as told by your health care provider. Contact a health care provider if:  You have new or worsening bleeding during a bowel movement.  You have new or increased blood in your stool.  You have a change in bowel habits.  You unexpectedly lose weight.   Diverticulosis Diverticulosis is a condition that develops when small pouches (diverticula) form in the wall of the large intestine (colon). The colon is where water is absorbed and stool is formed. The pouches form when the inside layer of the colon pushes through weak spots in the outer layers of the colon. You may have a few pouches or many of them. What are the causes? The cause of this condition is not known. What increases the  risk? The following factors may make you more likely to develop this condition:  Being older than age 74. Your risk for this condition increases with age. Diverticulosis is rare among people younger than age 64. By age 75, many people have it.  Eating a low-fiber diet.  Having frequent constipation.  Being overweight.  Not getting enough exercise.  Smoking.  Taking over-the-counter pain medicines, like aspirin and ibuprofen.  Having a family history of diverticulosis. What are the signs or symptoms? In most people, there are no symptoms of this condition. If you do have symptoms, they may include:  Bloating.  Cramps in the abdomen.  Constipation or diarrhea.  Pain in the lower left side of the abdomen. How is this diagnosed? This condition is most often diagnosed during an exam for other colon problems. Because diverticulosis usually has no symptoms, it often cannot be diagnosed independently. This condition may be diagnosed by:  Using a flexible scope to examine the colon (colonoscopy).  Taking an X-ray of the colon after dye has been put into the colon (barium enema).  Doing a CT scan. How is this treated? You may not need treatment for this condition if you have never developed an infection related to diverticulosis. If you have had an infection before, treatment may include:  Eating a high-fiber diet. This may include eating more fruits, vegetables, and grains.  Taking a fiber supplement.  Taking a live bacteria supplement (probiotic).  Taking medicine to relax your colon.  Taking antibiotic medicines. Follow these instructions at home:  Drink 6-8 glasses of water or more each day to prevent constipation.  Try not to strain when you have a bowel movement.  If you have had an infection before:  Eat more fiber as directed by your health care provider or your diet and nutrition specialist (dietitian).  Take a fiber supplement or probiotic, if your health  care provider approves.  Take over-the-counter and prescription medicines only as told by your health care provider.  If you were prescribed an antibiotic, take it as told by your health care provider. Do not stop taking the antibiotic even if you start to feel better.  Keep all follow-up visits as told by your health care provider. This is important. Contact a health care provider if:  You have pain in your abdomen.  You have bloating.  You have cramps.  You have not had a bowel movement in 3 days. Get help right away if:  Your pain gets worse.  Your bloating becomes very bad.  You have a fever or chills, and your symptoms suddenly get worse.  You vomit.  You have bowel movements that are bloody or black.  You have bleeding from your rectum. Summary  Diverticulosis is a condition that develops when small pouches (diverticula) form in the wall of the large intestine (colon).  You may have a few pouches or many of them.  This condition is most often diagnosed during an exam for other colon problems.  If you have had an infection related to diverticulosis, treatment may include increasing the fiber in your diet, taking supplements, or taking medicines.    Hemorrhoids Hemorrhoids are swollen veins in and around the rectum or anus. There are two types of hemorrhoids:  Internal hemorrhoids. These occur in the veins that are just inside the rectum. They may poke through to the outside and become irritated and painful.  External hemorrhoids. These occur in the veins that are outside of the anus and can be felt as a painful swelling or hard lump near the anus. Most hemorrhoids do not cause serious problems, and they can be managed with home treatments such as diet and lifestyle changes. If home treatments do not help your symptoms, procedures can be done to shrink or remove the hemorrhoids. What are the causes? This condition is caused by increased pressure in the anal area.  This pressure may result from various things, including:  Constipation.  Straining to have a bowel movement.  Diarrhea.  Pregnancy.  Obesity.  Sitting for long periods of time.  Heavy lifting or other activity that causes you to strain.  Anal sex. What are the signs or symptoms? Symptoms of this condition include:  Pain.  Anal itching or irritation.  Rectal bleeding.  Leakage of stool (feces).  Anal swelling.  One or more lumps around the anus. How is this diagnosed? This condition can often be diagnosed through a visual exam. Other exams or tests may also be  done, such as:  Examination of the rectal area with a gloved hand (digital rectal exam).  Examination of the anal canal using a small tube (anoscope).  A blood test, if you have lost a significant amount of blood.  A test to look inside the colon (sigmoidoscopy or colonoscopy). How is this treated? This condition can usually be treated at home. However, various procedures may be done if dietary changes, lifestyle changes, and other home treatments do not help your symptoms. These procedures can help make the hemorrhoids smaller or remove them completely. Some of these procedures involve surgery, and others do not. Common procedures include:  Rubber band ligation. Rubber bands are placed at the base of the hemorrhoids to cut off the blood supply to them.  Sclerotherapy. Medicine is injected into the hemorrhoids to shrink them.  Infrared coagulation. A type of light energy is used to get rid of the hemorrhoids.  Hemorrhoidectomy surgery. The hemorrhoids are surgically removed, and the veins that supply them are tied off.  Stapled hemorrhoidopexy surgery. A circular stapling device is used to remove the hemorrhoids and use staples to cut off the blood supply to them. Follow these instructions at home: Eating and drinking   Eat foods that have a lot of fiber in them, such as whole grains, beans, nuts, fruits,  and vegetables. Ask your health care provider about taking products that have added fiber (fiber supplements).  Drink enough fluid to keep your urine clear or pale yellow. Managing pain and swelling   Take warm sitz baths for 20 minutes, 3-4 times a day to ease pain and discomfort.  If directed, apply ice to the affected area. Using ice packs between sitz baths may be helpful.  Put ice in a plastic bag.  Place a towel between your skin and the bag.  Leave the ice on for 20 minutes, 2-3 times a day. General instructions   Take over-the-counter and prescription medicines only as told by your health care provider.  Use medicated creams or suppositories as told.  Exercise regularly.  Go to the bathroom when you have the urge to have a bowel movement. Do not wait.  Avoid straining to have bowel movements.  Keep the anal area dry and clean. Use wet toilet paper or moist towelettes after a bowel movement.  Do not sit on the toilet for long periods of time. This increases blood pooling and pain. Contact a health care provider if:  You have increasing pain and swelling that are not controlled by treatment or medicine.  You have uncontrolled bleeding.  You have difficulty having a bowel movement, or you are unable to have a bowel movement.  You have pain or inflammation outside the area of the hemorrhoids. This information is not intended to replace advice given to you by your health care provider. Make sure you discuss any questions you have with your health care provider. Document Released: 02/19/2000 Document Revised: 07/22/2015 Document Reviewed: 11/05/2014 Elsevier Interactive Patient Education  2017 [employer].

## 2016-05-26 NOTE — Telephone Encounter (Signed)
[facility] HOME HEALTH NURSE CALLED STATING BP IS 102/40 PLEASE ADVISE

## 2016-05-27 ENCOUNTER — Telehealth: Payer: Self-pay | Admitting: Family Medicine

## 2016-05-27 DIAGNOSIS — R296 Repeated falls: Secondary | ICD-10-CM | POA: Diagnosis not present

## 2016-05-27 DIAGNOSIS — S42301D Unspecified fracture of shaft of humerus, right arm, subsequent encounter for fracture with routine healing: Secondary | ICD-10-CM | POA: Diagnosis not present

## 2016-05-27 DIAGNOSIS — I110 Hypertensive heart disease with heart failure: Secondary | ICD-10-CM | POA: Diagnosis not present

## 2016-05-27 DIAGNOSIS — I2510 Atherosclerotic heart disease of native coronary artery without angina pectoris: Secondary | ICD-10-CM | POA: Diagnosis not present

## 2016-05-27 DIAGNOSIS — K643 Fourth degree hemorrhoids: Secondary | ICD-10-CM

## 2016-05-27 DIAGNOSIS — I509 Heart failure, unspecified: Secondary | ICD-10-CM | POA: Diagnosis not present

## 2016-05-27 DIAGNOSIS — E113599 Type 2 diabetes mellitus with proliferative diabetic retinopathy without macular edema, unspecified eye: Secondary | ICD-10-CM | POA: Diagnosis not present

## 2016-05-27 NOTE — Telephone Encounter (Signed)
Had Colonoscopy yesterday by Dr Gala Romney.  Told patient has grade 4 hemorrhoids and should see a Psychologist, sport and exercise.  That probably cause of rectal bleeding.  Referral placed.

## 2016-05-30 ENCOUNTER — Encounter (HOSPITAL_COMMUNITY): Payer: Self-pay | Admitting: Internal Medicine

## 2016-05-30 ENCOUNTER — Encounter: Payer: Self-pay | Admitting: Internal Medicine

## 2016-05-31 ENCOUNTER — Encounter: Payer: Self-pay | Admitting: General Surgery

## 2016-05-31 ENCOUNTER — Ambulatory Visit (INDEPENDENT_AMBULATORY_CARE_PROVIDER_SITE_OTHER): Payer: Medicare HMO | Admitting: General Surgery

## 2016-05-31 VITALS — BP 117/49 | HR 83 | Temp 98.4°F | Resp 18 | Ht 67.0 in | Wt 155.0 lb

## 2016-05-31 DIAGNOSIS — K648 Other hemorrhoids: Secondary | ICD-10-CM | POA: Diagnosis not present

## 2016-05-31 NOTE — Progress Notes (Signed)
Claudia [name]; [id number]; October 29, 1931   HPI patient is an 83 year old white female who was recently found on colonoscopy to have prolapsing internal hemorrhoids. She states she has had hemorrhoidal disease for many years, but over the last 3-4 years, she has noticed that they have been protruding and have been bleeding. She denies any pain. She does strain when she moves her bowels as she has had more problems with constipation. She denies any family history of colon cancer. She denies any abdominal pain. She has been using creams to help with any swelling of the hemorrhoids. She has not had to push the hemorrhoids back in.  Past Medical History:  Diagnosis Date  . Anemia   . Arthritis   . Cancer (HCC)    hx of skin cancer  . CHF (congestive heart failure) (HCC)    diastolic   . CKD (chronic kidney disease) stage 4, GFR 15-29 ml/min (HCC)   . Coronary artery disease   . Diabetes mellitus    insulin dependent  . GERD (gastroesophageal reflux disease)   . Headache(784.0)    rare migraines  . Hypercholesterolemia   . Hypertension   . Myocardial infarction 2013  . Osteoporosis   . Proliferative retinopathy due to DM ([place])   . PUD (peptic ulcer disease)   . S/P colonoscopy Sept 2011   left-sided diverticula, tubular adenoma  . S/P endoscopy Aug 2011   3 superficial gastric ulcers, NSAID-induced  . Shortness of breath   . SIADH (syndrome of inappropriate ADH production) ([place])     Past Surgical History:  Procedure Laterality Date  . APPENDECTOMY    . CARDIAC CATHETERIZATION  01/22/2013  . cardiac stents  07/19/2011  . COLONOSCOPY  12/04/09   normal rectum/left-sided diverticula  . COLONOSCOPY N/A 05/26/2016   Procedure: COLONOSCOPY;  Surgeon: Daneil Dolin, MD;  [facility];  Service: Endoscopy;  Laterality: N/A;  2:15pm  . CORONARY ANGIOPLASTY  07/2011  . CORONARY ARTERY BYPASS GRAFT N/A 11/08/2013   Procedure: CORONARY ARTERY BYPASS GRAFTING (CABG), on pump, times two,  using left internal mammary artery, cryo saphenous vein.;  Surgeon: Ivin Poot, MD;  Location: [place];  Service: Open Heart Surgery;  Laterality: N/A;  LIMA-LAD CRYOVEIN -OM  . ESOPHAGOGASTRODUODENOSCOPY  10/16/09   normal without barrett's/three superficial gastric ulcers  . EYE SURGERY     cataracts  . INTRAOPERATIVE TRANSESOPHAGEAL ECHOCARDIOGRAM N/A 11/08/2013   Procedure: INTRAOPERATIVE TRANSESOPHAGEAL ECHOCARDIOGRAM;  Surgeon: Ivin Poot, MD;  Location: [place];  Service: Open Heart Surgery;  Laterality: N/A;  . JOINT REPLACEMENT  arthroscopy to knee  . LEFT HEART CATHETERIZATION WITH CORONARY ANGIOGRAM N/A 07/19/2011   Procedure: LEFT HEART CATHETERIZATION WITH CORONARY ANGIOGRAM;  Surgeon: Laverda Page, MD;  Location[facility] C[facility] LAB;  Service: Cardiovascular;  Laterality: N/A;  . LEFT HEART CATHETERIZATION WITH CORONARY ANGIOGRAM N/A 12/21/2011   Procedure: LEFT HEART CATHETERIZATION WITH CORONARY ANGIOGRAM;  Surgeon: Laverda Page, MD;  Location[facility] CATH LAB;  Service: Cardiovascular;  Laterality: N/A;  . LEFT HEART CATHETERIZATION WITH CORONARY ANGIOGRAM N/A 02/20/2012   Procedure: LEFT HEART CATHETERIZATION WITH CORONARY ANGIOGRAM;  Surgeon: Laverda Page, MD;  Location: [facility] CATH LAB;  Service: Cardiovascular;  Laterality: N/A;  . LEFT HEART CATHETERIZATION WITH CORONARY ANGIOGRAM N/A 09/03/2012   Procedure: LEFT HEART CATHETERIZATION WITH CORONARY ANGIOGRAM;  Surgeon: Laverda Page, MD;  Location[facility] C[facility] LAB;  Service: Cardiovascular;  Laterality: N/A;  . LEFT HEART CATHETERIZATION WITH CORONARY ANGIOGRAM N/A  12/04/2012   Procedure: LEFT HEART CATHETERIZATION WITH CORONARY ANGIOGRAM;  Surgeon: Laverda Page, MD;  Location: [facility] CATH LAB;  Service: Cardiovascular;  Laterality: N/A;  . LEFT HEART CATHETERIZATION WITH CORONARY ANGIOGRAM N/A 01/22/2013   Procedure: LEFT HEART CATHETERIZATION WITH CORONARY ANGIOGRAM;  Surgeon: Laverda Page, MD;  Location: [facility] CATH LAB;   Service: Cardiovascular;  Laterality: N/A;  . LEFT HEART CATHETERIZATION WITH CORONARY ANGIOGRAM N/A 06/26/2013   Procedure: LEFT HEART CATHETERIZATION WITH CORONARY ANGIOGRAM;  Surgeon: Laverda Page, MD;  Location: [facility] CATH LAB;  Service: Cardiovascular;  Laterality: N/A;  . LEFT HEART CATHETERIZATION WITH CORONARY ANGIOGRAM N/A 11/05/2013   Procedure: LEFT HEART CATHETERIZATION WITH CORONARY ANGIOGRAM;  Surgeon: Laverda Page, MD;  Location: [facility] CATH LAB;  Service: Cardiovascular;  Laterality: N/A;  . MITRAL VALVE REPLACEMENT N/A 11/08/2013   Procedure: MITRAL VALVE (MV) REPLACEMENT;  Surgeon: Ivin Poot, MD;  Location: [place];  Service: Open Heart Surgery;  Laterality: N/A;  #25 MAGNA MITRAL EASE  . PERCUTANEOUS CORONARY STENT INTERVENTION (PCI-S)  09/03/2012   Procedure: PERCUTANEOUS CORONARY STENT INTERVENTION (PCI-S);  Surgeon: Laverda Page, MD;  Location: [facility] CATH LAB;  Service: Cardiovascular;;  . TOE AMPUTATION Left 2013   left second toe amputation  . TONSILLECTOMY      No family history on file.  Current Outpatient Prescriptions on File Prior to Visit  Medication Sig Dispense Refill  . ACCU-CHEK FASTCLIX LANCETS MISC USE  TO TEST BLOOD SUGAR FOUR TIMES DAILY FASTING, BEFORE MEALS  AND AT BEDTIME DUE TO FLUCTUATING BLOOD SUGARS 408 each 3  . ascorbic acid (VITAMIN C) 500 MG tablet Take 1 tablet (500 mg total) by mouth 2 (two) times daily. 60 tablet 11  . aspirin 81 MG chewable tablet Chew 2 tablets (162 mg total) by mouth daily. 60 tablet 11  . B-D ULTRAFINE III SHORT PEN 31G X 8 MM MISC USE  TO INJECT  INSULIN  5  TIMES  DAILY 450 each 3  . cetirizine (ZYRTEC) 10 MG tablet Take 1 tablet (10 mg total) by mouth at bedtime. 30 tablet 11  . doxazosin (CARDURA) 4 MG tablet Take 1 tablet (4 mg total) by mouth daily. 90 tablet 3  . ferrous sulfate 325 (65 FE) MG tablet Take 1 tablet (325 mg total) by mouth 2 (two) times daily with a meal. 60 tablet 11  . fluticasone (FLONASE) 50  MCG/ACT nasal spray USE 1 SPRAY IN EACH NOSTRIL EVERY DAY 32 g 3  . glucose blood (ACCU-CHEK SMARTVIEW) test strip Check BS 4x daily due to fluctuating blood sugars - Fasting, before meals and QHS- Dx: E11.65 500 each 3  . insulin aspart (NOVOLOG FLEXPEN) 100 UNIT/ML FlexPen 70-120=0    251-300=5 121-150=1  301-350=7 151-200=2  351-400=9 201-250=3  400 or >, call MD  units of insulin (Patient taking differently: Inject 1-5 Units into the skin 3 (three) times daily as needed. 70-120=0    251-300=5 121-150=1  301-350=7 151-200=2  351-400=9 201-250=3  400 or >, call MD  units of insulin) 45 mL 3  . Insulin Glargine (LANTUS SOLOSTAR) 100 UNIT/ML Solostar Pen INJECT 20 UNITS SUBCUTANEOUSLY TWICE DAILY (Patient taking differently: Inject 20 Units into the skin daily. INJECT 20 UNITS SUBCUTANEOUSLY DAILY) 45 mL 3  . losartan (COZAAR) 25 MG tablet Take 25 mg by mouth daily.    . nitroGLYCERIN (NITROSTAT) 0.4 MG SL tablet Place 1 tablet (0.4 mg total) under the tongue every 5 (five) minutes as needed  for chest pain. 25 tablet 2  . pantoprazole (PROTONIX) 40 MG tablet TAKE 1 TABLET EVERY DAY 90 tablet 1  . simvastatin (ZOCOR) 20 MG tablet TAKE 1 TABLET EVERY DAY 90 tablet 1  . torsemide (DEMADEX) 20 MG tablet TAKE 1 TABLET TWICE DAILY (Patient taking differently: TAKE 1 TABLET  DAILY EACH MORNING) 180 tablet 3  . traMADol (ULTRAM) 50 MG tablet Take 50 mg by mouth every 6 (six) hours as needed for moderate pain.     . traZODone (DESYREL) 50 MG tablet TAKE 1 TABLET AT BEDTIME 90 tablet 1   No current facility-administered medications on file prior to visit.     Allergies  Allergen Reactions  . Aspirin Other (See Comments)    High doses caused stomach bleeds    History  Alcohol Use No    History  Smoking Status  . Never Smoker  Smokeless Tobacco  . Never Used    Review of Systems  Constitutional: Positive for malaise/fatigue.  HENT: Negative.   Eyes: Negative.   Respiratory: Positive for  cough.   Cardiovascular: Negative.   Gastrointestinal: Positive for blood in stool, constipation and heartburn.  Genitourinary: Negative.   Musculoskeletal: Positive for joint pain.  Skin: Negative.   Neurological: Positive for dizziness.  Endo/Heme/Allergies: Negative.   Psychiatric/Behavioral: Negative.     Objective   Vitals:   05/31/16 0938  BP: (!) 117/49  Pulse: 83  Resp: 18  Temp: 98.4 F (36.9 C)    Physical Exam  Constitutional: She is oriented to person, place, and time and well-developed, well-nourished, and in no distress.  HENT:  Head: Normocephalic and atraumatic.  Neck: Normal range of motion. Neck supple.  Cardiovascular: Normal rate, regular rhythm and normal heart sounds.   No murmur heard. Pulmonary/Chest: Effort normal and breath sounds normal. She has no wheezes.  Abdominal: Soft. Bowel sounds are normal. She exhibits no distension. There is no tenderness.  Genitourinary:  Genitourinary Comments: Prolapsing hemorrhoidal disease with circumferential involvement and superficial mucosal prolapse present. No active bleeding noted. Sphincter tone is fair.  Neurological: She is alert and oriented to person, place, and time.  Skin: Skin is warm and dry.  Vitals reviewed.   Assessment  Prolapsing hemorrhoidal disease   Plan: Patient is scheduled for an extensive hemorrhoidectomy on 06/17/2016. The risks and benefits of the procedure including bleeding, infection, recurrence of the disease were fully explained to the patient, who gave informed consent. The patient was told that I could not take care of all the hemorrhoids that were present, thus she may need further surgery in the future.

## 2016-05-31 NOTE — H&P (Signed)
Claudia [name]; [id number]; Feb 16, 1932   HPI patient is an 83 year old white female who was recently found on colonoscopy to have prolapsing internal hemorrhoids. She states she has had hemorrhoidal disease for many years, but over the last 3-4 years, she has noticed that they have been protruding and have been bleeding. She denies any pain. She does strain when she moves her bowels as she has had more problems with constipation. She denies any family history of colon cancer. She denies any abdominal pain. She has been using creams to help with any swelling of the hemorrhoids. She has not had to push the hemorrhoids back in.      Past Medical History:  Diagnosis Date  . Anemia   . Arthritis   . Cancer (HCC)    hx of skin cancer  . CHF (congestive heart failure) (HCC)    diastolic   . CKD (chronic kidney disease) stage 4, GFR 15-29 ml/min (HCC)   . Coronary artery disease   . Diabetes mellitus    insulin dependent  . GERD (gastroesophageal reflux disease)   . Headache(784.0)    rare migraines  . Hypercholesterolemia   . Hypertension   . Myocardial infarction 2013  . Osteoporosis   . Proliferative retinopathy due to DM ([place])   . PUD (peptic ulcer disease)   . S/P colonoscopy Sept 2011   left-sided diverticula, tubular adenoma  . S/P endoscopy Aug 2011   3 superficial gastric ulcers, NSAID-induced  . Shortness of breath   . SIADH (syndrome of inappropriate ADH production) ([place])          Past Surgical History:  Procedure Laterality Date  . APPENDECTOMY    . CARDIAC CATHETERIZATION  01/22/2013  . cardiac stents  07/19/2011  . COLONOSCOPY  12/04/09   normal rectum/left-sided diverticula  . COLONOSCOPY N/A 05/26/2016   Procedure: COLONOSCOPY;  Surgeon: Daneil Dolin, MD;  [facility];  Service: Endoscopy;  Laterality: N/A;  2:15pm  . CORONARY ANGIOPLASTY  07/2011  . CORONARY ARTERY BYPASS GRAFT N/A 11/08/2013   Procedure: CORONARY ARTERY  BYPASS GRAFTING (CABG), on pump, times two, using left internal mammary artery, cryo saphenous vein.;  Surgeon: Ivin Poot, MD;  Location: [place];  Service: Open Heart Surgery;  Laterality: N/A;  LIMA-LAD CRYOVEIN -OM  . ESOPHAGOGASTRODUODENOSCOPY  10/16/09   normal without barrett's/three superficial gastric ulcers  . EYE SURGERY     cataracts  . INTRAOPERATIVE TRANSESOPHAGEAL ECHOCARDIOGRAM N/A 11/08/2013   Procedure: INTRAOPERATIVE TRANSESOPHAGEAL ECHOCARDIOGRAM;  Surgeon: Ivin Poot, MD;  Location: [place];  Service: Open Heart Surgery;  Laterality: N/A;  . JOINT REPLACEMENT  arthroscopy to knee  . LEFT HEART CATHETERIZATION WITH CORONARY ANGIOGRAM N/A 07/19/2011   Procedure: LEFT HEART CATHETERIZATION WITH CORONARY ANGIOGRAM;  Surgeon: Laverda Page, MD;  Location: [facility] CATH LAB;  Service: Cardiovascular;  Laterality: N/A;  . LEFT HEART CATHETERIZATION WITH CORONARY ANGIOGRAM N/A 12/21/2011   Procedure: LEFT HEART CATHETERIZATION WITH CORONARY ANGIOGRAM;  Surgeon: Laverda Page, MD;  Location: [facility] CATH LAB;  Service: Cardiovascular;  Laterality: N/A;  . LEFT HEART CATHETERIZATION WITH CORONARY ANGIOGRAM N/A 02/20/2012   Procedure: LEFT HEART CATHETERIZATION WITH CORONARY ANGIOGRAM;  Surgeon: Laverda Page, MD;  Location: [facility] CATH LAB;  Service: Cardiovascular;  Laterality: N/A;  . LEFT HEART CATHETERIZATION WITH CORONARY ANGIOGRAM N/A 09/03/2012   Procedure: LEFT HEART CATHETERIZATION WITH CORONARY ANGIOGRAM;  Surgeon: Laverda Page, MD;  Location: [facility] CATH LAB;  Service: Cardiovascular;  Laterality: N/A;  .  LEFT HEART CATHETERIZATION WITH CORONARY ANGIOGRAM N/A 12/04/2012   Procedure: LEFT HEART CATHETERIZATION WITH CORONARY ANGIOGRAM;  Surgeon: Laverda Page, MD;  Location: [facility] CATH LAB;  Service: Cardiovascular;  Laterality: N/A;  . LEFT HEART CATHETERIZATION WITH CORONARY ANGIOGRAM N/A 01/22/2013   Procedure: LEFT HEART CATHETERIZATION WITH CORONARY ANGIOGRAM;   Surgeon: Laverda Page, MD;  Location: [facility] CATH LAB;  Service: Cardiovascular;  Laterality: N/A;  . LEFT HEART CATHETERIZATION WITH CORONARY ANGIOGRAM N/A 06/26/2013   Procedure: LEFT HEART CATHETERIZATION WITH CORONARY ANGIOGRAM;  Surgeon: Laverda Page, MD;  Location: [facility] CATH LAB;  Service: Cardiovascular;  Laterality: N/A;  . LEFT HEART CATHETERIZATION WITH CORONARY ANGIOGRAM N/A 11/05/2013   Procedure: LEFT HEART CATHETERIZATION WITH CORONARY ANGIOGRAM;  Surgeon: Laverda Page, MD;  Location: [facility] C[facility] LAB;  Service: Cardiovascular;  Laterality: N/A;  . MITRAL VALVE REPLACEMENT N/A 11/08/2013   Procedure: MITRAL VALVE (MV) REPLACEMENT;  Surgeon: Ivin Poot, MD;  Location: [place];  Service: Open Heart Surgery;  Laterality: N/A;  #25 MAGNA MITRAL EASE  . PERCUTANEOUS CORONARY STENT INTERVENTION (PCI-S)  09/03/2012   Procedure: PERCUTANEOUS CORONARY STENT INTERVENTION (PCI-S);  Surgeon: Laverda Page, MD;  Location: [facility] CATH LAB;  Service: Cardiovascular;;  . TOE AMPUTATION Left 2013   left second toe amputation  . TONSILLECTOMY      No family history on file.        Current Outpatient Prescriptions on File Prior to Visit  Medication Sig Dispense Refill  . ACCU-CHEK FASTCLIX LANCETS MISC USE  TO TEST BLOOD SUGAR FOUR TIMES DAILY FASTING, BEFORE MEALS  AND AT BEDTIME DUE TO FLUCTUATING BLOOD SUGARS 408 each 3  . ascorbic acid (VITAMIN C) 500 MG tablet Take 1 tablet (500 mg total) by mouth 2 (two) times daily. 60 tablet 11  . aspirin 81 MG chewable tablet Chew 2 tablets (162 mg total) by mouth daily. 60 tablet 11  . B-D ULTRAFINE III SHORT PEN 31G X 8 MM MISC USE  TO INJECT  INSULIN  5  TIMES  DAILY 450 each 3  . cetirizine (ZYRTEC) 10 MG tablet Take 1 tablet (10 mg total) by mouth at bedtime. 30 tablet 11  . doxazosin (CARDURA) 4 MG tablet Take 1 tablet (4 mg total) by mouth daily. 90 tablet 3  . ferrous sulfate 325 (65 FE) MG tablet Take 1 tablet (325 mg total) by mouth  2 (two) times daily with a meal. 60 tablet 11  . fluticasone (FLONASE) 50 MCG/ACT nasal spray USE 1 SPRAY IN EACH NOSTRIL EVERY DAY 32 g 3  . glucose blood (ACCU-CHEK SMARTVIEW) test strip Check BS 4x daily due to fluctuating blood sugars - Fasting, before meals and QHS- Dx: E11.65 500 each 3  . insulin aspart (NOVOLOG FLEXPEN) 100 UNIT/ML FlexPen 70-120=0    251-300=5 121-150=1  301-350=7 151-200=2  351-400=9 201-250=3  400 or >, call MD  units of insulin (Patient taking differently: Inject 1-5 Units into the skin 3 (three) times daily as needed. 70-120=0    251-300=5 121-150=1  301-350=7 151-200=2  351-400=9 201-250=3  400 or >, call MD  units of insulin) 45 mL 3  . Insulin Glargine (LANTUS SOLOSTAR) 100 UNIT/ML Solostar Pen INJECT 20 UNITS SUBCUTANEOUSLY TWICE DAILY (Patient taking differently: Inject 20 Units into the skin daily. INJECT 20 UNITS SUBCUTANEOUSLY DAILY) 45 mL 3  . losartan (COZAAR) 25 MG tablet Take 25 mg by mouth daily.    . nitroGLYCERIN (NITROSTAT) 0.4 MG SL tablet Place 1  tablet (0.4 mg total) under the tongue every 5 (five) minutes as needed for chest pain. 25 tablet 2  . pantoprazole (PROTONIX) 40 MG tablet TAKE 1 TABLET EVERY DAY 90 tablet 1  . simvastatin (ZOCOR) 20 MG tablet TAKE 1 TABLET EVERY DAY 90 tablet 1  . torsemide (DEMADEX) 20 MG tablet TAKE 1 TABLET TWICE DAILY (Patient taking differently: TAKE 1 TABLET  DAILY EACH MORNING) 180 tablet 3  . traMADol (ULTRAM) 50 MG tablet Take 50 mg by mouth every 6 (six) hours as needed for moderate pain.     . traZODone (DESYREL) 50 MG tablet TAKE 1 TABLET AT BEDTIME 90 tablet 1   No current facility-administered medications on file prior to visit.          Allergies  Allergen Reactions  . Aspirin Other (See Comments)    High doses caused stomach bleeds       History  Alcohol Use No       History  Smoking Status  . Never Smoker  Smokeless Tobacco  . Never Used    Review of Systems   Constitutional: Positive for malaise/fatigue.  HENT: Negative.   Eyes: Negative.   Respiratory: Positive for cough.   Cardiovascular: Negative.   Gastrointestinal: Positive for blood in stool, constipation and heartburn.  Genitourinary: Negative.   Musculoskeletal: Positive for joint pain.  Skin: Negative.   Neurological: Positive for dizziness.  Endo/Heme/Allergies: Negative.   Psychiatric/Behavioral: Negative.     Objective      Vitals:   05/31/16 0938  BP: (!) 117/49  Pulse: 83  Resp: 18  Temp: 98.4 F (36.9 C)    Physical Exam  Constitutional: She is oriented to person, place, and time and well-developed, well-nourished, and in no distress.  HENT:  Head: Normocephalic and atraumatic.  Neck: Normal range of motion. Neck supple.  Cardiovascular: Normal rate, regular rhythm and normal heart sounds.   No murmur heard. Pulmonary/Chest: Effort normal and breath sounds normal. She has no wheezes.  Abdominal: Soft. Bowel sounds are normal. She exhibits no distension. There is no tenderness.  Genitourinary:  Genitourinary Comments: Prolapsing hemorrhoidal disease with circumferential involvement and superficial mucosal prolapse present. No active bleeding noted. Sphincter tone is fair.  Neurological: She is alert and oriented to person, place, and time.  Skin: Skin is warm and dry.  Vitals reviewed.   Assessment  Prolapsing hemorrhoidal disease   Plan: Patient is scheduled for an extensive hemorrhoidectomy on 06/17/2016. The risks and benefits of the procedure including bleeding, infection, recurrence of the disease were fully explained to the patient, who gave informed consent. The patient was told that I could not take care of all the hemorrhoids that were present, thus she may need further surgery in the future.

## 2016-05-31 NOTE — Patient Instructions (Signed)
Surgical Procedures for Hemorrhoids Surgical procedures can be used to treat hemorrhoids. Hemorrhoids are swollen veins that are inside the rectum (internal hemorrhoids) or around the anus (external hemorrhoids). They are caused by increased pressure in the anal area. This pressure may result from straining to have a bowel movement (constipation), diarrhea, pregnancy, obesity, anal sex, or sitting for long periods of time. Hemorrhoids can cause symptoms such as pain and bleeding. Surgery may be needed if diet changes, lifestyle changes, and other treatments do not help your symptoms. Various surgical methods may be used. Three common methods are:  Closed hemorrhoidectomy. The hemorrhoids are surgically removed, and the surgical cuts (incisions) are closed with stitches (sutures).  Open hemorrhoidectomy. The hemorrhoids are surgically removed, but the incisions are allowed to heal without sutures.  Stapled hemorrhoidopexy. The hemorrhoids are removed using a device that takes out a ring of excess tissue. Tell a health care provider about:  Any allergies you have.  All medicines you are taking, including vitamins, herbs, eye drops, creams, and over-the-counter medicines.  Any problems you or family members have had with anesthetic medicines.  Any blood disorders you have.  Any surgeries you have had.  Any medical conditions you have.  Whether you are pregnant or may be pregnant. What are the risks? Generally, this is a safe procedure. However, problems may occur, including:  Infection.  Bleeding.  Allergic reactions to medicines.  Damage to other structures or organs.  Pain.  Constipation.  Difficulty passing urine.  Narrowing of the anal canal (stenosis).  Difficulty controlling bowel movements (incontinence). What happens before the procedure?  Ask your health care provider about:  Changing or stopping your regular medicines. This is especially important if you are  taking diabetes medicines or blood thinners.  Taking medicines such as aspirin and ibuprofen. These medicines can thin your blood. Do not take these medicines before your procedure if your health care provider instructs you not to.  You may need to have a procedure to examine the inside of your colon with a scope (colonoscopy). Your health care provider may do this to make sure that there are no other causes for your bleeding or pain.  Follow instructions from your health care provider about eating or drinking restrictions.  You may be instructed to take a laxative and an enema to clean out your colon before surgery (bowel prep). Carefully follow instructions from your health care provider about bowel prep.  Ask your health care provider how your surgical site will be marked or identified.  You may be given antibiotic medicine to help prevent infection.  Plan to have someone take you home after the procedure. What happens during the procedure?  To reduce your risk of infection:  Your health care team will wash or sanitize their hands.  Your skin will be washed with soap.  An IV tube will be inserted into one of your veins.  You will be given one or more of the following:  A medicine to help you relax (sedative).  A medicine to numb the area (local anesthetic).  A medicine to make you fall asleep (general anesthetic).  A medicine that is injected into an area of your body to numb everything below the injection site (regional anesthetic).  A lubricating jelly may be placed into your rectum.  Your surgeon will insert a short scope (anoscope) into your rectum to examine the hemorrhoids.  One of the following hemorrhoid procedures will be performed. Closed Hemorrhoidectomy   Your surgeon will  use surgical instruments to open the tissue around the hemorrhoids.  The veins that supply the hemorrhoids will be tied off with a suture.  The hemorrhoids will be removed.  The tissue  that surrounds the hemorrhoids will be closed with sutures that your body can absorb (absorbable sutures). Open Hemorrhoidectomy   The hemorrhoids will be removed with surgical instruments.  The incisions will be left open to heal without sutures. Stapled Hemorrhoidopexy   Your surgeon will use a circular stapling device to remove the hemorrhoids.  The device will be inserted into your anus. It will remove a circular ring of tissue that includes hemorrhoid tissue and some tissue above the hemorrhoids.  The staples in the device will close the edges of removed tissue. This will cut off the blood supply to the hemorrhoids and will pull any remaining hemorrhoids back into place. Each of these procedures may vary among health care providers and hospitals. What happens after the procedure?  Your blood pressure, heart rate, breathing rate, and blood oxygen level will be monitored often until the medicines you were given have worn off.  You will be given pain medicine as needed. This information is not intended to replace advice given to you by your health care provider. Make sure you discuss any questions you have with your health care provider. Document Released: 12/19/2008 Document Revised: 07/30/2015 Document Reviewed: 05/19/2014 Elsevier Interactive Patient Education  2017 [employer].

## 2016-06-10 NOTE — Patient Instructions (Signed)
Claudia [name]  06/10/2016     @PREFPERIOPPHARMACY @   Your procedure is scheduled on  06/17/2016   Report to [facility] at  725  A.M.  Call this number if you have problems the morning of surgery:  [phone]   Remember:  Do not eat food or drink liquids after midnight.  Take these medicines the morning of surgery with A SIP OF WATER  Zyrtec, cardura, losartan, protonix, ultram. Take 1/2 of your usual insulin dosage the night before your surgery. DO NOT take any medicines for diabetes the morning of your surgery.   Do not wear jewelry, make-up or nail polish.  Do not wear lotions, powders, or perfumes, or deoderant.  Do not shave 48 hours prior to surgery.  Men may shave face and neck.  Do not bring valuables to the hospital.  [facility] is not responsible for any belongings or valuables.  Contacts, dentures or bridgework may not be worn into surgery.  Leave your suitcase in the car.  After surgery it may be brought to your room.  For patients admitted to the hospital, discharge time will be determined by your treatment team.  Patients discharged the day of surgery will not be allowed to drive home.   Name and phone number of your driver:   family Special instructions:  None  Please read over the following fact sheets that you were given. Anesthesia Post-op Instructions and Care and Recovery After Surgery      Surgical Procedures for Hemorrhoids, Care After Refer to this sheet in the next few weeks. These instructions provide you with information about caring for yourself after your procedure. Your health care provider may also give you more specific instructions. Your treatment has been planned according to current medical practices, but problems sometimes occur. Call your health care provider if you have any problems or questions after your procedure. What can I expect after the procedure? After the procedure, it is common to have:  Rectal pain.  Pain when you  are having a bowel movement.  Slight rectal bleeding. Follow these instructions at home: Medicines   Take over-the-counter and prescription medicines only as told by your health care provider.  Do not drive or operate heavy machinery while taking prescription pain medicine.  Use a stool softener or a bulk laxative as told by your health care provider. Activity   Rest at home. Return to your normal activities as told by your health care provider.  Do not lift anything that is heavier than 10 lb (4.5 kg).  Do not sit for long periods of time. Take a walk every day or as told by your health care provider.  Do not strain to have a bowel movement. Do not spend a long time sitting on the toilet. Eating and drinking   Eat foods that contain fiber, such as whole grains, beans, nuts, fruits, and vegetables.  Drink enough fluid to keep your urine clear or pale yellow. General instructions   Sit in a warm bath 2-3 times per day to relieve soreness or itching.  Keep all follow-up visits as told by your health care provider. This is important. Contact a health care provider if:  Your pain medicine is not helping.  You have a fever or chills.  You become constipated.  You have trouble passing urine. Get help right away if:  You have very bad rectal pain.  You have heavy bleeding from your rectum. This information is  not intended to replace advice given to you by your health care provider. Make sure you discuss any questions you have with your health care provider. Document Released: 05/14/2003 Document Revised: 07/30/2015 Document Reviewed: 05/19/2014 Elsevier Interactive Patient Education  2017 [place] POST-ANESTHESIA  IMMEDIATELY FOLLOWING SURGERY:  Do not drive or operate machinery for the first twenty four hours after surgery.  Do not make any important decisions for twenty four hours after surgery or while taking narcotic pain medications or  sedatives.  If you develop intractable nausea and vomiting or a severe headache please notify your doctor immediately.  FOLLOW-UP:  Please make an appointment with your surgeon as instructed. You do not need to follow up with anesthesia unless specifically instructed to do so.  WOUND CARE INSTRUCTIONS (if applicable):  Keep a dry clean dressing on the anesthesia/puncture wound site if there is drainage.  Once the wound has quit draining you may leave it open to air.  Generally you should leave the bandage intact for twenty four hours unless there is drainage.  If the epidural site drains for more than 36-48 hours please call the anesthesia department.  QUESTIONS?:  Please feel free to call your physician or the hospital operator if you have any questions, and they will be happy to assist you.

## 2016-06-13 ENCOUNTER — Other Ambulatory Visit: Payer: Self-pay

## 2016-06-13 ENCOUNTER — Encounter (HOSPITAL_COMMUNITY)
Admission: RE | Admit: 2016-06-13 | Discharge: 2016-06-13 | Disposition: A | Discharge status: Home / Self Care | Payer: Medicare HMO | Source: Ambulatory Visit | Attending: General Surgery | Admitting: General Surgery

## 2016-06-13 ENCOUNTER — Encounter (HOSPITAL_COMMUNITY): Payer: Self-pay

## 2016-06-13 DIAGNOSIS — Z01812 Encounter for preprocedural laboratory examination: Secondary | ICD-10-CM | POA: Diagnosis not present

## 2016-06-13 DIAGNOSIS — K648 Other hemorrhoids: Secondary | ICD-10-CM | POA: Diagnosis not present

## 2016-06-13 DIAGNOSIS — Z01818 Encounter for other preprocedural examination: Secondary | ICD-10-CM | POA: Insufficient documentation

## 2016-06-13 LAB — CBC WITH DIFFERENTIAL/PLATELET
BASOS ABS: 0 10*3/uL (ref 0.0–0.1)
Basophils Relative: 1 %
Eosinophils Absolute: 0.1 10*3/uL (ref 0.0–0.7)
Eosinophils Relative: 3 %
HEMATOCRIT: 29.1 % — AB (ref 36.0–46.0)
Hemoglobin: 9.6 g/dL — ABNORMAL LOW (ref 12.0–15.0)
LYMPHS ABS: 1.9 10*3/uL (ref 0.7–4.0)
LYMPHS PCT: 50 %
MCH: 29.5 pg (ref 26.0–34.0)
MCHC: 33 g/dL (ref 30.0–36.0)
MCV: 89.5 fL (ref 78.0–100.0)
MONO ABS: 0.4 10*3/uL (ref 0.1–1.0)
MONOS PCT: 10 %
Neutro Abs: 1.4 10*3/uL — ABNORMAL LOW (ref 1.7–7.7)
Neutrophils Relative %: 36 %
Platelets: 171 10*3/uL (ref 150–400)
RBC: 3.25 MIL/uL — ABNORMAL LOW (ref 3.87–5.11)
RDW: 14.2 % (ref 11.5–15.5)
WBC: 3.8 10*3/uL — ABNORMAL LOW (ref 4.0–10.5)

## 2016-06-13 LAB — BASIC METABOLIC PANEL
Anion gap: 9 (ref 5–15)
BUN: 58 mg/dL — AB (ref 6–20)
CHLORIDE: 99 mmol/L — AB (ref 101–111)
CO2: 22 mmol/L (ref 22–32)
CREATININE: 2.12 mg/dL — AB (ref 0.44–1.00)
Calcium: 8.3 mg/dL — ABNORMAL LOW (ref 8.9–10.3)
GFR calc Af Amer: 24 mL/min — ABNORMAL LOW (ref >60)
GFR calc non Af Amer: 21 mL/min — ABNORMAL LOW (ref >60)
Glucose, Bld: 125 mg/dL — ABNORMAL HIGH (ref 65–99)
Potassium: 4.6 mmol/L (ref 3.5–5.1)
Sodium: 130 mmol/L — ABNORMAL LOW (ref 135–145)

## 2016-06-14 NOTE — Pre-Procedure Instructions (Signed)
Dr Patsey Berthold notified of labs, including H&H, Hgb, BUN and creatinine and sodium.Pt has stage 4 kidney disease but is not on dialysis. Order obtained to start normal saline as iv fluids on arrival at [facility].

## 2016-06-15 DIAGNOSIS — I10 Essential (primary) hypertension: Secondary | ICD-10-CM | POA: Diagnosis not present

## 2016-06-15 DIAGNOSIS — E1122 Type 2 diabetes mellitus with diabetic chronic kidney disease: Secondary | ICD-10-CM | POA: Diagnosis not present

## 2016-06-15 DIAGNOSIS — G4734 Idiopathic sleep related nonobstructive alveolar hypoventilation: Secondary | ICD-10-CM | POA: Diagnosis not present

## 2016-06-15 DIAGNOSIS — I5032 Chronic diastolic (congestive) heart failure: Secondary | ICD-10-CM | POA: Diagnosis not present

## 2016-06-15 DIAGNOSIS — I2510 Atherosclerotic heart disease of native coronary artery without angina pectoris: Secondary | ICD-10-CM | POA: Diagnosis not present

## 2016-06-17 ENCOUNTER — Encounter (HOSPITAL_COMMUNITY): Payer: Self-pay | Admitting: *Deleted

## 2016-06-17 ENCOUNTER — Ambulatory Visit (HOSPITAL_COMMUNITY): Payer: Medicare HMO | Admitting: Anesthesiology

## 2016-06-17 ENCOUNTER — Encounter (HOSPITAL_COMMUNITY)
Admission: RE | Disposition: A | Discharge status: Home / Self Care | Payer: Self-pay | Source: Ambulatory Visit | Attending: General Surgery

## 2016-06-17 ENCOUNTER — Ambulatory Visit (HOSPITAL_COMMUNITY)
Admission: RE | Admit: 2016-06-17 | Discharge: 2016-06-17 | Disposition: A | Discharge status: Home / Self Care | Payer: Medicare HMO | Source: Ambulatory Visit | Attending: General Surgery | Admitting: General Surgery

## 2016-06-17 DIAGNOSIS — E1151 Type 2 diabetes mellitus with diabetic peripheral angiopathy without gangrene: Secondary | ICD-10-CM | POA: Insufficient documentation

## 2016-06-17 DIAGNOSIS — E7800 Pure hypercholesterolemia, unspecified: Secondary | ICD-10-CM | POA: Insufficient documentation

## 2016-06-17 DIAGNOSIS — K219 Gastro-esophageal reflux disease without esophagitis: Secondary | ICD-10-CM | POA: Insufficient documentation

## 2016-06-17 DIAGNOSIS — Z8673 Personal history of transient ischemic attack (TIA), and cerebral infarction without residual deficits: Secondary | ICD-10-CM | POA: Insufficient documentation

## 2016-06-17 DIAGNOSIS — Z955 Presence of coronary angioplasty implant and graft: Secondary | ICD-10-CM | POA: Insufficient documentation

## 2016-06-17 DIAGNOSIS — E113599 Type 2 diabetes mellitus with proliferative diabetic retinopathy without macular edema, unspecified eye: Secondary | ICD-10-CM | POA: Diagnosis not present

## 2016-06-17 DIAGNOSIS — Z794 Long term (current) use of insulin: Secondary | ICD-10-CM | POA: Insufficient documentation

## 2016-06-17 DIAGNOSIS — Z89422 Acquired absence of other left toe(s): Secondary | ICD-10-CM | POA: Diagnosis not present

## 2016-06-17 DIAGNOSIS — E222 Syndrome of inappropriate secretion of antidiuretic hormone: Secondary | ICD-10-CM | POA: Diagnosis not present

## 2016-06-17 DIAGNOSIS — I130 Hypertensive heart and chronic kidney disease with heart failure and stage 1 through stage 4 chronic kidney disease, or unspecified chronic kidney disease: Secondary | ICD-10-CM | POA: Insufficient documentation

## 2016-06-17 DIAGNOSIS — Z85828 Personal history of other malignant neoplasm of skin: Secondary | ICD-10-CM | POA: Insufficient documentation

## 2016-06-17 DIAGNOSIS — I2510 Atherosclerotic heart disease of native coronary artery without angina pectoris: Secondary | ICD-10-CM | POA: Insufficient documentation

## 2016-06-17 DIAGNOSIS — Z7951 Long term (current) use of inhaled steroids: Secondary | ICD-10-CM | POA: Diagnosis not present

## 2016-06-17 DIAGNOSIS — I252 Old myocardial infarction: Secondary | ICD-10-CM | POA: Insufficient documentation

## 2016-06-17 DIAGNOSIS — K623 Rectal prolapse: Secondary | ICD-10-CM | POA: Diagnosis not present

## 2016-06-17 DIAGNOSIS — Z952 Presence of prosthetic heart valve: Secondary | ICD-10-CM | POA: Insufficient documentation

## 2016-06-17 DIAGNOSIS — D649 Anemia, unspecified: Secondary | ICD-10-CM | POA: Insufficient documentation

## 2016-06-17 DIAGNOSIS — Z79899 Other long term (current) drug therapy: Secondary | ICD-10-CM | POA: Insufficient documentation

## 2016-06-17 DIAGNOSIS — N184 Chronic kidney disease, stage 4 (severe): Secondary | ICD-10-CM | POA: Diagnosis not present

## 2016-06-17 DIAGNOSIS — Z7982 Long term (current) use of aspirin: Secondary | ICD-10-CM | POA: Insufficient documentation

## 2016-06-17 DIAGNOSIS — I5032 Chronic diastolic (congestive) heart failure: Secondary | ICD-10-CM | POA: Insufficient documentation

## 2016-06-17 DIAGNOSIS — K649 Unspecified hemorrhoids: Secondary | ICD-10-CM | POA: Diagnosis not present

## 2016-06-17 DIAGNOSIS — E1122 Type 2 diabetes mellitus with diabetic chronic kidney disease: Secondary | ICD-10-CM | POA: Insufficient documentation

## 2016-06-17 DIAGNOSIS — Z96659 Presence of unspecified artificial knee joint: Secondary | ICD-10-CM | POA: Insufficient documentation

## 2016-06-17 DIAGNOSIS — K648 Other hemorrhoids: Secondary | ICD-10-CM | POA: Diagnosis not present

## 2016-06-17 DIAGNOSIS — M810 Age-related osteoporosis without current pathological fracture: Secondary | ICD-10-CM | POA: Insufficient documentation

## 2016-06-17 DIAGNOSIS — Z951 Presence of aortocoronary bypass graft: Secondary | ICD-10-CM | POA: Insufficient documentation

## 2016-06-17 HISTORY — PX: HEMORRHOID SURGERY: SHX153

## 2016-06-17 LAB — GLUCOSE, CAPILLARY
GLUCOSE-CAPILLARY: 110 mg/dL — AB (ref 65–99)
Glucose-Capillary: 117 mg/dL — ABNORMAL HIGH (ref 65–99)

## 2016-06-17 SURGERY — HEMORRHOIDECTOMY
Anesthesia: General

## 2016-06-17 MED ORDER — SODIUM CHLORIDE 0.9 % IR SOLN
Status: DC | PRN
  Administered 2016-06-17: 1000 mL

## 2016-06-17 MED ORDER — SODIUM CHLORIDE 0.9 % IV SOLN
INTRAVENOUS | Status: DC
  Administered 2016-06-17: 08:00:00 via INTRAVENOUS

## 2016-06-17 MED ORDER — MIDAZOLAM HCL 2 MG/2ML IJ SOLN
INTRAMUSCULAR | Status: AC
  Filled 2016-06-17: qty 2

## 2016-06-17 MED ORDER — CHLORHEXIDINE GLUCONATE CLOTH 2 % EX PADS: 6.0000 | MEDICATED_PAD | Freq: Once | CUTANEOUS | Status: DC

## 2016-06-17 MED ORDER — BUPIVACAINE HCL (PF) 0.5 % IJ SOLN
INTRAMUSCULAR | Status: AC
  Filled 2016-06-17: qty 30

## 2016-06-17 MED ORDER — PROPOFOL 10 MG/ML IV BOLUS
INTRAVENOUS | Status: AC
  Filled 2016-06-17: qty 20

## 2016-06-17 MED ORDER — MIDAZOLAM HCL 2 MG/2ML IJ SOLN: 1.0000 mg | INTRAMUSCULAR | Status: AC

## 2016-06-17 MED ORDER — LIDOCAINE HCL (PF) 1 % IJ SOLN
INTRAMUSCULAR | Status: AC
  Filled 2016-06-17: qty 5

## 2016-06-17 MED ORDER — SODIUM CHLORIDE 0.9 % IJ SOLN
INTRAMUSCULAR | Status: AC
  Filled 2016-06-17: qty 10

## 2016-06-17 MED ORDER — EPHEDRINE SULFATE 50 MG/ML IJ SOLN
INTRAMUSCULAR | Status: AC
  Filled 2016-06-17: qty 1

## 2016-06-17 MED ORDER — SODIUM CHLORIDE 0.9 % IV SOLN
1.0000 g | INTRAVENOUS | Status: AC
  Administered 2016-06-17: 1 g via INTRAVENOUS
  Filled 2016-06-17: qty 1

## 2016-06-17 MED ORDER — FENTANYL CITRATE (PF) 100 MCG/2ML IJ SOLN
INTRAMUSCULAR | Status: DC | PRN
  Administered 2016-06-17: 12.5 ug via INTRAVENOUS
  Administered 2016-06-17 (×2): 25 ug via INTRAVENOUS
  Administered 2016-06-17: 12.5 ug via INTRAVENOUS
  Administered 2016-06-17: 25 ug via INTRAVENOUS

## 2016-06-17 MED ORDER — FENTANYL CITRATE (PF) 100 MCG/2ML IJ SOLN
25.0000 ug | INTRAMUSCULAR | Status: DC | PRN
  Administered 2016-06-17: 25 ug via INTRAVENOUS
  Filled 2016-06-17: qty 2

## 2016-06-17 MED ORDER — FENTANYL CITRATE (PF) 100 MCG/2ML IJ SOLN
INTRAMUSCULAR | Status: AC
  Filled 2016-06-17: qty 2

## 2016-06-17 MED ORDER — LIDOCAINE HCL (CARDIAC) 10 MG/ML IV SOLN
INTRAVENOUS | Status: DC | PRN
  Administered 2016-06-17: 40 mg via INTRAVENOUS

## 2016-06-17 MED ORDER — BUPIVACAINE HCL (PF) 0.5 % IJ SOLN
INTRAMUSCULAR | Status: DC | PRN
  Administered 2016-06-17: 10 mL

## 2016-06-17 MED ORDER — LIDOCAINE VISCOUS 2 % MT SOLN
OROMUCOSAL | Status: DC | PRN
  Administered 2016-06-17: 20 mL

## 2016-06-17 MED ORDER — LIDOCAINE VISCOUS 2 % MT SOLN
OROMUCOSAL | Status: AC
  Filled 2016-06-17: qty 15

## 2016-06-17 MED ORDER — LACTATED RINGERS IV SOLN: INTRAVENOUS | Status: DC

## 2016-06-17 MED ORDER — TRAMADOL HCL 50 MG PO TABS: 50.0000 mg | ORAL_TABLET | Freq: Four times a day (QID) | ORAL | 0 refills | Status: DC | PRN

## 2016-06-17 MED ORDER — PROPOFOL 10 MG/ML IV BOLUS
INTRAVENOUS | Status: DC | PRN
  Administered 2016-06-17: 80 mg via INTRAVENOUS
  Administered 2016-06-17: 20 mg via INTRAVENOUS

## 2016-06-17 SURGICAL SUPPLY — 32 items
BAG HAMPER (MISCELLANEOUS) ×3 IMPLANT
CLOTH BEACON ORANGE TIMEOUT ST (SAFETY) ×3 IMPLANT
COVER LIGHT HANDLE STERIS (MISCELLANEOUS) ×6 IMPLANT
DECANTER SPIKE VIAL GLASS SM (MISCELLANEOUS) ×3 IMPLANT
DRAPE PROXIMA HALF (DRAPES) ×3 IMPLANT
ELECT REM PT RETURN 9FT ADLT (ELECTROSURGICAL) ×3
ELECTRODE REM PT RTRN 9FT ADLT (ELECTROSURGICAL) ×1 IMPLANT
FORMALIN 10 PREFIL 120ML (MISCELLANEOUS) ×3 IMPLANT
GAUZE SPONGE 4X4 12PLY STRL (GAUZE/BANDAGES/DRESSINGS) ×3 IMPLANT
GLOVE BIOGEL PI IND STRL 6.5 (GLOVE) IMPLANT
GLOVE BIOGEL PI IND STRL 7.0 (GLOVE) ×1 IMPLANT
GLOVE BIOGEL PI INDICATOR 6.5 (GLOVE) ×4
GLOVE BIOGEL PI INDICATOR 7.0 (GLOVE) ×2
GLOVE SURG SS PI 7.5 STRL IVOR (GLOVE) ×6 IMPLANT
GOWN STRL REUS W/ TWL XL LVL3 (GOWN DISPOSABLE) ×1 IMPLANT
GOWN STRL REUS W/TWL LRG LVL3 (GOWN DISPOSABLE) ×3 IMPLANT
GOWN STRL REUS W/TWL XL LVL3 (GOWN DISPOSABLE) ×3
HEMOSTAT SURGICEL 4X8 (HEMOSTASIS) ×3 IMPLANT
KIT ROOM TURNOVER AP CYSTO (KITS) ×3 IMPLANT
LIGASURE IMPACT 36 18CM CVD LR (INSTRUMENTS) ×3 IMPLANT
MANIFOLD NEPTUNE II (INSTRUMENTS) ×3 IMPLANT
NDL HYPO 25X1 1.5 SAFETY (NEEDLE) ×1 IMPLANT
NEEDLE HYPO 25X1 1.5 SAFETY (NEEDLE) ×3 IMPLANT
NS IRRIG 1000ML POUR BTL (IV SOLUTION) ×3 IMPLANT
PACK PERI GYN (CUSTOM PROCEDURE TRAY) ×3 IMPLANT
PAD ARMBOARD 7.5X6 YLW CONV (MISCELLANEOUS) ×3 IMPLANT
SET BASIN LINEN APH (SET/KITS/TRAYS/PACK) ×3 IMPLANT
SPONGE COVER 4X4 NONSTERILE (GAUZE/BANDAGES/DRESSINGS) ×2 IMPLANT
SURGILUBE 3G PEEL PACK STRL (MISCELLANEOUS) ×3 IMPLANT
SUT SILK 0 FSL (SUTURE) ×3 IMPLANT
SUT VIC AB 2-0 CT2 27 (SUTURE) ×4 IMPLANT
SYR CONTROL 10ML LL (SYRINGE) ×3 IMPLANT

## 2016-06-17 NOTE — Anesthesia Procedure Notes (Signed)
Procedure Name: LMA Insertion Date/Time: 06/17/2016 8:42 AM Performed by: Tressie Stalker E Pre-anesthesia Checklist: Patient identified, Patient being monitored, Emergency Drugs available, Timeout performed and Suction available Patient Re-evaluated:Patient Re-evaluated prior to inductionOxygen Delivery Method: Circle System Utilized Preoxygenation: Pre-oxygenation with 100% oxygen Intubation Type: IV induction Ventilation: Mask ventilation without difficulty LMA: LMA inserted LMA Size: 3.0 Number of attempts: 1 Placement Confirmation: positive ETCO2 and breath sounds checked- equal and bilateral

## 2016-06-17 NOTE — Discharge Instructions (Signed)
Surgical Procedures for Hemorrhoids, Care After Refer to this sheet in the next few weeks. These instructions provide you with information about caring for yourself after your procedure. Your health care provider may also give you more specific instructions. Your treatment has been planned according to current medical practices, but problems sometimes occur. Call your health care provider if you have any problems or questions after your procedure. What can I expect after the procedure? After the procedure, it is common to have:  Rectal pain.  Pain when you are having a bowel movement.  Slight rectal bleeding. Follow these instructions at home: Medicines   Take over-the-counter and prescription medicines only as told by your health care provider.  Do not drive or operate heavy machinery while taking prescription pain medicine.  Use a stool softener or a bulk laxative as told by your health care provider. Activity   Rest at home. Return to your normal activities as told by your health care provider.  Do not lift anything that is heavier than 10 lb (4.5 kg).  Do not sit for long periods of time. Take a walk every day or as told by your health care provider.  Do not strain to have a bowel movement. Do not spend a long time sitting on the toilet. Eating and drinking   Eat foods that contain fiber, such as whole grains, beans, nuts, fruits, and vegetables.  Drink enough fluid to keep your urine clear or pale yellow. General instructions   Sit in a warm bath 2-3 times per day to relieve soreness or itching.  Keep all follow-up visits as told by your health care provider. This is important. Contact a health care provider if:  Your pain medicine is not helping.  You have a fever or chills.  You become constipated.  You have trouble passing urine. Get help right away if:  You have very bad rectal pain.  You have heavy bleeding from your rectum. This information is not  intended to replace advice given to you by your health care provider. Make sure you discuss any questions you have with your health care provider. Document Released: 05/14/2003 Document Revised: 07/30/2015 Document Reviewed: 05/19/2014 Elsevier Interactive Patient Education  2017 [place] POST-ANESTHESIA  IMMEDIATELY FOLLOWING SURGERY:  Do not drive or operate machinery for the first twenty four hours after surgery.  Do not make any important decisions for twenty four hours after surgery or while taking narcotic pain medications or sedatives.  If you develop intractable nausea and vomiting or a severe headache please notify your doctor immediately.  FOLLOW-UP:  Please make an appointment with your surgeon as instructed. You do not need to follow up with anesthesia unless specifically instructed to do so.  WOUND CARE INSTRUCTIONS (if applicable):  Keep a dry clean dressing on the anesthesia/puncture wound site if there is drainage.  Once the wound has quit draining you may leave it open to air.  Generally you should leave the bandage intact for twenty four hours unless there is drainage.  If the epidural site drains for more than 36-48 hours please call the anesthesia department.  QUESTIONS?:  Please feel free to call your physician or the hospital operator if you have any questions, and they will be happy to assist you.

## 2016-06-17 NOTE — Transfer of Care (Signed)
Immediate Anesthesia Transfer of Care Note  Patient: Claudia [name]  Procedure(s) Performed: Procedure(s): EXTENSIVE HEMORRHOIDECTOMY (N/A)  Patient Location: PACU  Anesthesia Type:General  Level of Consciousness: awake  Airway & Oxygen Therapy: Patient Spontanous Breathing and Patient connected to face mask oxygen  Post-op Assessment: Report given to RN  Post vital signs: Reviewed and stable  Last Vitals:  Vitals:   06/17/16 0825 06/17/16 0827  BP: (!) 126/57 (!) 126/57  Pulse:    Resp: (!) 24 (!) 29  Temp:      Last Pain:  Vitals:   06/17/16 0805  TempSrc: Oral      Patients Stated Pain Goal: 5 (97/47/18 5501)  Complications: No apparent anesthesia complications

## 2016-06-17 NOTE — Interval H&P Note (Signed)
History and Physical Interval Note:  06/17/2016 8:11 AM  Claudia [name]  has presented today for surgery, with the diagnosis of prolapsed hemorrhoids  The various methods of treatment have been discussed with the patient and family. After consideration of risks, benefits and other options for treatment, the patient has consented to  Procedure(s): EXTENSIVE HEMORRHOIDECTOMY (N/A) as a surgical intervention .  The patient's history has been reviewed, patient examined, no change in status, stable for surgery.  I have reviewed the patient's chart and labs.  Questions were answered to the patient's satisfaction.     Aviva Signs

## 2016-06-17 NOTE — Op Note (Signed)
Patient:  Claudia [name]  DOB:  1931-09-13  MRN:  [id number]   Preop Diagnosis:  Prolapsing hemorrhoidal disease  Postop Diagnosis:  Same  Procedure:  Extensive hemorrhoidectomy  Surgeon:  Aviva Signs, M.D.  Anes:  Gen.  Indications:  Patient is an 83 year old white female who presents with prolapsing internal and external hemorrhoidal disease. It is extensive and I told her that she may need another hemorrhoidectomy in the future. I plan on only taking care of those prolapsing hemorrhoids that are causing her bleeding. The risks and benefits of the procedure including bleeding, infection, and recurrence of the hemorrhoidal disease were fully explained to the patient, who gave informed consent.  Procedure note:  The patient was placed in the lithotomy position after general anesthesia was administered. The perineum was prepped and draped using usual sterile technique with Betadine. Surgical site confirmation was performed.  On examination, the patient had multiple columns of internal and external prolapsing hemorrhoids. She also had partial circumferential prolapse of the rectum. The external sphincter mechanism was identified. Hemorrhoids at the 5:00 and 7:00 positions were excised in a columnlike fashion using the LigaSure. Additional internal hemorrhoids at the 4:00 and 6:00 position were also excised using the LigaSure. Care was taken to avoid the external sphincter mechanism. Some mild prolapsing hemorrhoids in other areas of the anus were left in place. There was no bleeding at the end of the procedure. 0.5% Sensorcaine was instilled into the surrounding wound. Surgicel and viscous Xylocaine rectal packing was placed.  All tape and needle counts were correct at the end of the procedure. The patient was awakened and transferred to PACU in stable condition.  Complications:  None  EBL:  25 mL  Specimen:  Hemorrhoids

## 2016-06-17 NOTE — Anesthesia Postprocedure Evaluation (Signed)
Anesthesia Post Note  Patient: Claudia [name]  Procedure(s) Performed: Procedure(s) (LRB): EXTENSIVE HEMORRHOIDECTOMY (N/A)  Patient location during evaluation: PACU Anesthesia Type: General Level of consciousness: awake and alert and oriented Pain management: pain level controlled Vital Signs Assessment: post-procedure vital signs reviewed and stable Respiratory status: spontaneous breathing Cardiovascular status: blood pressure returned to baseline Postop Assessment: no signs of nausea or vomiting Anesthetic complications: no     Last Vitals:  Vitals:   06/17/16 0830 06/17/16 0933  BP:  (!) 115/53  Pulse:  68  Resp: (!) 29 15  Temp:  36.8 C    Last Pain:  Vitals:   06/17/16 0805  TempSrc: Oral                 Lailana Shira

## 2016-06-17 NOTE — Anesthesia Preprocedure Evaluation (Signed)
Anesthesia Evaluation  Patient identified by MRN, date of birth, ID band Patient awake    Reviewed: Allergy & Precautions, H&P , NPO status , Patient's Chart, lab work & pertinent test results  Airway Mallampati: II  TM Distance: >3 FB Neck ROM: Full    Dental  (+) Edentulous Upper, Edentulous Lower, Dental Advisory Given   Pulmonary shortness of breath,    breath sounds clear to auscultation       Cardiovascular hypertension, (-) angina+ CAD, + Past MI, + Cardiac Stents, + Peripheral Vascular Disease and +CHF  + Valvular Problems/Murmurs MR  Rhythm:Regular Rate:Normal     Neuro/Psych  Headaches, TIA   GI/Hepatic PUD, GERD  ,  Endo/Other  diabetes, Type 2  Renal/GU Renal disease     Musculoskeletal   Abdominal   Peds  Hematology  (+) anemia ,   Anesthesia Other Findings   Reproductive/Obstetrics                             Anesthesia Physical Anesthesia Plan  ASA: III  Anesthesia Plan: General   Post-op Pain Management:    Induction: Intravenous  Airway Management Planned: LMA  Additional Equipment:   Intra-op Plan:   Post-operative Plan: Extubation in OR  Informed Consent: I have reviewed the patients History and Physical, chart, labs and discussed the procedure including the risks, benefits and alternatives for the proposed anesthesia with the patient or authorized representative who has indicated his/her understanding and acceptance.     Plan Discussed with:   Anesthesia Plan Comments:         Anesthesia Quick Evaluation

## 2016-06-21 ENCOUNTER — Encounter (HOSPITAL_COMMUNITY): Payer: Self-pay | Admitting: General Surgery

## 2016-06-23 ENCOUNTER — Encounter: Payer: Self-pay | Admitting: General Surgery

## 2016-06-23 ENCOUNTER — Ambulatory Visit (INDEPENDENT_AMBULATORY_CARE_PROVIDER_SITE_OTHER): Payer: Self-pay | Admitting: General Surgery

## 2016-06-23 VITALS — BP 106/47 | HR 84 | Temp 99.1°F | Resp 18 | Ht 67.0 in | Wt 158.0 lb

## 2016-06-23 DIAGNOSIS — Z09 Encounter for follow-up examination after completed treatment for conditions other than malignant neoplasm: Secondary | ICD-10-CM

## 2016-06-23 NOTE — Progress Notes (Signed)
Subjective:     Claudia [name]    Status post extensive hemorrhoidectomy.  Patient doing remarkably well.  She states she has no incontinence.  She has had minimal blood per rectum.  She is overall pleased with the results. Objective:    BP (!) 106/47   Pulse 84   Temp 99.1 F (37.3 C)   Resp 18   Ht 5\' 7"  (1.702 m)   Wt 158 lb (71.7 kg)   BMI 24.75 kg/m   General:  alert, cooperative, appears stated age and no distress    Rectal examination reveals the site of surgical resection healing well.  No active bleeding noted.  Mild swelling present.     Assessment:    Doing well postoperatively.    Plan:    May soak in tub.  Would use a medicated wipes as able.  Follow-up in the office in 3 weeks.

## 2016-06-27 ENCOUNTER — Other Ambulatory Visit: Payer: Self-pay | Admitting: Family Medicine

## 2016-07-06 DIAGNOSIS — I5032 Chronic diastolic (congestive) heart failure: Secondary | ICD-10-CM | POA: Diagnosis not present

## 2016-07-07 ENCOUNTER — Other Ambulatory Visit: Payer: Self-pay | Admitting: Family Medicine

## 2016-07-12 DIAGNOSIS — I129 Hypertensive chronic kidney disease with stage 1 through stage 4 chronic kidney disease, or unspecified chronic kidney disease: Secondary | ICD-10-CM | POA: Diagnosis not present

## 2016-07-12 DIAGNOSIS — N189 Chronic kidney disease, unspecified: Secondary | ICD-10-CM | POA: Diagnosis not present

## 2016-07-12 DIAGNOSIS — N2581 Secondary hyperparathyroidism of renal origin: Secondary | ICD-10-CM | POA: Diagnosis not present

## 2016-07-12 DIAGNOSIS — N184 Chronic kidney disease, stage 4 (severe): Secondary | ICD-10-CM | POA: Diagnosis not present

## 2016-07-12 DIAGNOSIS — E1129 Type 2 diabetes mellitus with other diabetic kidney complication: Secondary | ICD-10-CM | POA: Diagnosis not present

## 2016-07-12 DIAGNOSIS — E785 Hyperlipidemia, unspecified: Secondary | ICD-10-CM | POA: Diagnosis not present

## 2016-07-12 DIAGNOSIS — I509 Heart failure, unspecified: Secondary | ICD-10-CM | POA: Diagnosis not present

## 2016-07-12 DIAGNOSIS — I2510 Atherosclerotic heart disease of native coronary artery without angina pectoris: Secondary | ICD-10-CM | POA: Diagnosis not present

## 2016-07-12 DIAGNOSIS — D631 Anemia in chronic kidney disease: Secondary | ICD-10-CM | POA: Diagnosis not present

## 2016-07-14 ENCOUNTER — Ambulatory Visit (INDEPENDENT_AMBULATORY_CARE_PROVIDER_SITE_OTHER): Payer: Self-pay | Admitting: General Surgery

## 2016-07-14 ENCOUNTER — Encounter: Payer: Self-pay | Admitting: General Surgery

## 2016-07-14 VITALS — BP 112/43 | HR 74 | Temp 99.1°F | Resp 18 | Ht 67.0 in | Wt 155.0 lb

## 2016-07-14 DIAGNOSIS — Z09 Encounter for follow-up examination after completed treatment for conditions other than malignant neoplasm: Secondary | ICD-10-CM

## 2016-07-14 NOTE — Progress Notes (Signed)
Subjective:     Claudia [name]  Status post extensive hemorrhoidectomy. Doing well. Denies any incontinence. Minimal blood noted on follow-up information wipes herself. No clots. She is pleased with results. Minimal rectal pain noted. Objective:    BP (!) 112/43   Pulse 74   Temp 99.1 F (37.3 C)   Resp 18   Ht 5\' 7"  (1.702 m)   Wt 155 lb (70.3 kg)   BMI 24.28 kg/m   General:  alert, cooperative and no distress  Rectum healing well. Mild mucosal prolapse, but significantly improved compared to preop. No active bleeding noted.     Assessment:    Doing well postoperatively.    Plan:  Avoid straining or constipation when moving bowels. Follow-up as needed.

## 2016-07-15 DIAGNOSIS — G4734 Idiopathic sleep related nonobstructive alveolar hypoventilation: Secondary | ICD-10-CM | POA: Diagnosis not present

## 2016-07-20 ENCOUNTER — Telehealth: Payer: Self-pay | Admitting: Family Medicine

## 2016-07-20 NOTE — Telephone Encounter (Signed)
Pt's son called and wanted to know if we could refill pt's Tramadol - Ok to refill??   Looks like Dr. Arnoldo Morale was giving it to her post surg.

## 2016-07-21 MED ORDER — TRAMADOL HCL 50 MG PO TABS: 50.0000 mg | ORAL_TABLET | Freq: Four times a day (QID) | ORAL | 0 refills | Status: DC | PRN

## 2016-07-21 NOTE — Telephone Encounter (Signed)
Medication called/sent to requested pharmacy and pt's son aware

## 2016-07-21 NOTE — Telephone Encounter (Signed)
Ok, how often is she taking it?

## 2016-07-21 NOTE — Telephone Encounter (Signed)
Ok, just try to use as sparingly as possible (60)

## 2016-07-21 NOTE — Telephone Encounter (Signed)
1-2 a day - she still has 4 pills left from a 06/17/16 refill.

## 2016-08-15 DIAGNOSIS — G4734 Idiopathic sleep related nonobstructive alveolar hypoventilation: Secondary | ICD-10-CM | POA: Diagnosis not present

## 2016-08-25 ENCOUNTER — Encounter (HOSPITAL_COMMUNITY): Payer: Self-pay | Admitting: Emergency Medicine

## 2016-08-25 ENCOUNTER — Emergency Department (HOSPITAL_COMMUNITY)
Admission: EM | Admit: 2016-08-25 | Discharge: 2016-08-26 | Disposition: A | Discharge status: Home / Self Care | Payer: Medicare HMO | Attending: Emergency Medicine | Admitting: Emergency Medicine

## 2016-08-25 DIAGNOSIS — I509 Heart failure, unspecified: Secondary | ICD-10-CM | POA: Insufficient documentation

## 2016-08-25 DIAGNOSIS — I130 Hypertensive heart and chronic kidney disease with heart failure and stage 1 through stage 4 chronic kidney disease, or unspecified chronic kidney disease: Secondary | ICD-10-CM | POA: Insufficient documentation

## 2016-08-25 DIAGNOSIS — Z951 Presence of aortocoronary bypass graft: Secondary | ICD-10-CM | POA: Insufficient documentation

## 2016-08-25 DIAGNOSIS — Z794 Long term (current) use of insulin: Secondary | ICD-10-CM | POA: Insufficient documentation

## 2016-08-25 DIAGNOSIS — L089 Local infection of the skin and subcutaneous tissue, unspecified: Secondary | ICD-10-CM

## 2016-08-25 DIAGNOSIS — E1122 Type 2 diabetes mellitus with diabetic chronic kidney disease: Secondary | ICD-10-CM | POA: Diagnosis not present

## 2016-08-25 DIAGNOSIS — S80812A Abrasion, left lower leg, initial encounter: Secondary | ICD-10-CM

## 2016-08-25 DIAGNOSIS — Z79899 Other long term (current) drug therapy: Secondary | ICD-10-CM | POA: Diagnosis not present

## 2016-08-25 DIAGNOSIS — I252 Old myocardial infarction: Secondary | ICD-10-CM | POA: Insufficient documentation

## 2016-08-25 DIAGNOSIS — N184 Chronic kidney disease, stage 4 (severe): Secondary | ICD-10-CM | POA: Insufficient documentation

## 2016-08-25 DIAGNOSIS — Z955 Presence of coronary angioplasty implant and graft: Secondary | ICD-10-CM | POA: Diagnosis not present

## 2016-08-25 DIAGNOSIS — Z7982 Long term (current) use of aspirin: Secondary | ICD-10-CM | POA: Diagnosis not present

## 2016-08-25 DIAGNOSIS — I2510 Atherosclerotic heart disease of native coronary artery without angina pectoris: Secondary | ICD-10-CM | POA: Diagnosis not present

## 2016-08-25 DIAGNOSIS — M79662 Pain in left lower leg: Secondary | ICD-10-CM | POA: Diagnosis present

## 2016-08-25 DIAGNOSIS — Z85828 Personal history of other malignant neoplasm of skin: Secondary | ICD-10-CM | POA: Insufficient documentation

## 2016-08-25 MED ORDER — SULFAMETHOXAZOLE-TRIMETHOPRIM 800-160 MG PO TABS
1.0000 | ORAL_TABLET | Freq: Once | ORAL | Status: AC
  Administered 2016-08-25: 1 via ORAL
  Filled 2016-08-25: qty 1

## 2016-08-25 NOTE — ED Triage Notes (Signed)
Her dog jumped on her yesterday and scratched her leg, today swelling, pain and some green drainage when cleaning tonight

## 2016-08-26 LAB — BASIC METABOLIC PANEL
ANION GAP: 10 (ref 5–15)
BUN: 79 mg/dL — ABNORMAL HIGH (ref 6–20)
CO2: 21 mmol/L — ABNORMAL LOW (ref 22–32)
Calcium: 8.2 mg/dL — ABNORMAL LOW (ref 8.9–10.3)
Chloride: 100 mmol/L — ABNORMAL LOW (ref 101–111)
Creatinine, Ser: 2.61 mg/dL — ABNORMAL HIGH (ref 0.44–1.00)
GFR, EST AFRICAN AMERICAN: 19 mL/min — AB (ref >60)
GFR, EST NON AFRICAN AMERICAN: 16 mL/min — AB (ref >60)
Glucose, Bld: 194 mg/dL — ABNORMAL HIGH (ref 65–99)
POTASSIUM: 4.2 mmol/L (ref 3.5–5.1)
SODIUM: 131 mmol/L — AB (ref 135–145)

## 2016-08-26 LAB — CBC WITH DIFFERENTIAL/PLATELET
BASOS ABS: 0 10*3/uL (ref 0.0–0.1)
Basophils Relative: 1 %
Eosinophils Absolute: 0.1 10*3/uL (ref 0.0–0.7)
Eosinophils Relative: 2 %
HCT: 25.6 % — ABNORMAL LOW (ref 36.0–46.0)
HEMOGLOBIN: 8.4 g/dL — AB (ref 12.0–15.0)
LYMPHS PCT: 51 %
Lymphs Abs: 1.8 10*3/uL (ref 0.7–4.0)
MCH: 28.8 pg (ref 26.0–34.0)
MCHC: 32.8 g/dL (ref 30.0–36.0)
MCV: 87.7 fL (ref 78.0–100.0)
Monocytes Absolute: 0.2 10*3/uL (ref 0.1–1.0)
Monocytes Relative: 6 %
NEUTROS ABS: 1.4 10*3/uL — AB (ref 1.7–7.7)
NEUTROS PCT: 40 %
PLATELETS: 173 10*3/uL (ref 150–400)
RBC: 2.92 MIL/uL — AB (ref 3.87–5.11)
RDW: 15 % (ref 11.5–15.5)
WBC: 3.5 10*3/uL — AB (ref 4.0–10.5)

## 2016-08-26 MED ORDER — SULFAMETHOXAZOLE-TRIMETHOPRIM 800-160 MG PO TABS: 1.0000 | ORAL_TABLET | Freq: Every day | ORAL | 0 refills | Status: DC

## 2016-08-26 MED ORDER — SULFAMETHOXAZOLE-TRIMETHOPRIM 800-160 MG PO TABS: 1.0000 | ORAL_TABLET | Freq: Every day | ORAL | Status: DC

## 2016-08-26 NOTE — Discharge Instructions (Signed)
Wash the area daily with soap and water, pat it dry. You can use triple antibiotic ointment on the wound. Take the antibiotic daily in the evening until gone.  Return to the ED if you get a fever, or the redness and swelling is getting a lot worse quickly. It can take the antibiotics 48 hours to start working well.

## 2016-08-26 NOTE — Progress Notes (Signed)
ANTIBIOTIC CONSULT NOTE-Preliminary  Pharmacy Consult for septra DS Indication: skin infection due to dog scratch  Allergies  Allergen Reactions  . Aspirin Other (See Comments)    High doses caused stomach bleeds    Patient Measurements: Height: 5\' 7"  (170.2 cm) Weight: 150 lb (68 kg) IBW/kg (Calculated) : 61.6 Adjusted Body Weight:   Vital Signs: Temp: 98 F (36.7 C) (06/21 2059) Temp Source: Oral (06/21 2059) BP: 107/45 (06/21 2059) Pulse Rate: 82 (06/21 2059)  Labs:  Recent Labs  08/26/16 0031  WBC 3.5*  HGB 8.4*  PLT 173  CREATININE 2.61*    Estimated Creatinine Clearance: 16.2 mL/min (A) (by C-G formula based on SCr of 2.61 mg/dL (H)).  No results for input(s): VANCOTROUGH, VANCOPEAK, VANCORANDOM, GENTTROUGH, GENTPEAK, GENTRANDOM, TOBRATROUGH, TOBRAPEAK, TOBRARND, AMIKACINPEAK, AMIKACINTROU, AMIKACIN in the last 72 hours.   Microbiology: No results found for this or any previous visit (from the past 720 hour(s)).  Medical History: Past Medical History:  Diagnosis Date  . Anemia   . Arthritis   . Cancer (HCC)    hx of skin cancer  . CHF (congestive heart failure) (HCC)    diastolic   . CKD (chronic kidney disease) stage 4, GFR 15-29 ml/min (HCC)   . Coronary artery disease   . Diabetes mellitus    insulin dependent  . GERD (gastroesophageal reflux disease)   . Headache(784.0)    rare migraines  . Hypercholesterolemia   . Hypertension   . Myocardial infarction ([place]) 2013  . Osteoporosis   . Proliferative retinopathy due to DM ([place])   . PUD (peptic ulcer disease)   . S/P colonoscopy Sept 2011   left-sided diverticula, tubular adenoma  . S/P endoscopy Aug 2011   3 superficial gastric ulcers, NSAID-induced  . Shortness of breath   . SIADH (syndrome of inappropriate ADH production) (HCC)     Medications:   (Not in a hospital admission) Scheduled:  . sulfamethoxazole-trimethoprim  1 tablet Oral QHS   Infusions:   PRN:  Anti-infectives    Start     Dose/Rate Route Frequency Ordered Stop   08/26/16 2200  sulfamethoxazole-trimethoprim (BACTRIM DS,SEPTRA DS) 800-160 MG per tablet 1 tablet     1 tablet Oral Daily at bedtime 08/26/16 0119     08/25/16 2345  sulfamethoxazole-trimethoprim (BACTRIM DS,SEPTRA DS) 800-160 MG per tablet 1 tablet     1 tablet Oral  Once 08/25/16 2331 08/25/16 2353      Assessment: 83 yo female with dog scratch yesterday on her leg. Today leg is swollen, painful and has green drainage.  Pt with CrCl=16.2 mL/min. Received one dose of Septra DS in ED, and is going home on Septra DS. Due to CrCl, dose should be reduced by 50%, therefore, recommend 1 tablet once daily vs BID.   Preliminary review of pertinent patient information completed.  Protocol will be initiated with dose(s) of Septra DS starting tomorrow evening, 24 hours after ED dose.    Roselyne Stalnaker Scarlett, RPH 08/26/2016,1:19 AM

## 2016-08-26 NOTE — ED Provider Notes (Signed)
[facility] DEPT Provider Note   CSN: [id number] Arrival date & time: 08/25/16  2045  Time seen 23:20 PM   History   Chief Complaint Chief Complaint  Patient presents with  . Wound Check    HPI Claudia [name] is a 83 y.o. female.  HPI  patient states she has a large puppy, he is a Production manager and he comes up to her knee level. She states he was playing and he jumped and he left claw marks on her left lower leg on June 18 or 19th. She states it started having some mild swelling yesterday and then started getting some redness tonight. She denies drainage, fever, or chills. She states she does have diabetes and yesterday her blood glucoses were over 200 although she states they were better today. Patient states she has diabetes and she had her left second toe amputated due to infection couple years ago. Tetanus UTD.   PCP Susy Frizzle, MD   Past Medical History:  Diagnosis Date  . Anemia   . Arthritis   . Cancer (HCC)    hx of skin cancer  . CHF (congestive heart failure) (HCC)    diastolic   . CKD (chronic kidney disease) stage 4, GFR 15-29 ml/min (HCC)   . Coronary artery disease   . Diabetes mellitus    insulin dependent  . GERD (gastroesophageal reflux disease)   . Headache(784.0)    rare migraines  . Hypercholesterolemia   . Hypertension   . Myocardial infarction ([place]) 2013  . Osteoporosis   . Proliferative retinopathy due to DM ([place])   . PUD (peptic ulcer disease)   . S/P colonoscopy Sept 2011   left-sided diverticula, tubular adenoma  . S/P endoscopy Aug 2011   3 superficial gastric ulcers, NSAID-induced  . Shortness of breath   . SIADH (syndrome of inappropriate ADH production) [facility])     Patient Active Problem List   Diagnosis Date Noted  . Internal and external prolapsed hemorrhoids   . Atherosclerosis of aorta ([place]) 02/24/2015  . Infection with ESBL Klebsiella oxytoca 06/20/2014  . Acute encephalopathy 06/18/2014  . Elevated troponin  06/17/2014  . UTI (lower urinary tract infection) 06/17/2014  . Altered mental status 02/24/2014  . Aphasia   . Urinary tract infectious disease   . UTI (urinary tract infection) 02/23/2014  . CKD (chronic kidney disease) stage 4, GFR 15-29 ml/min (HCC) 12/24/2013  . Fluid overload 12/24/2013  . Volume overload 12/23/2013  . S/P mitral valve replacement with bioprosthetic valve 12/09/2013  . Atrial fibrillation ([place]) 12/09/2013  . TIA (transient ischemic attack) 12/01/2013  . S/P CABG x 2 11/08/2013  . Unstable angina pectoris ([place]) 11/05/2013  . ACS (acute coronary syndrome) ([place]) 06/26/2013  . NSTEMI (non-ST elevated myocardial infarction) ([place]) 06/26/2013  . CHF (congestive heart failure) ([place]) 06/25/2012  . Hypercholesterolemia   . Osteopenia   . Unstable angina ([place]) 12/21/2011  . Osteomyelitis of toe ([place]) 10/26/2011  . ARF (acute renal failure) ([place]) 10/26/2011  . Hyperkalemia 10/26/2011  . Diarrhea 07/29/2011  . Hyponatremia 07/28/2011  . Nausea and vomiting 07/28/2011  . Benign hypertension 07/28/2011  . DM (diabetes mellitus), type 2 ([place]) 07/28/2011  . CAD (coronary artery disease), native coronary artery 07/20/2011  . GERD 12/28/2009  . PUD, HX OF 12/28/2009  . COLONIC POLYPS, ADENOMATOUS, HX OF 12/28/2009  . ANEMIA 10/22/2009    Past Surgical History:  Procedure Laterality Date  . APPENDECTOMY    . CARDIAC CATHETERIZATION  01/22/2013  .  cardiac stents  07/19/2011  . COLONOSCOPY  12/04/09   normal rectum/left-sided diverticula  . COLONOSCOPY N/A 05/26/2016   Procedure: COLONOSCOPY;  Surgeon: Daneil Dolin, MD;  [facility];  Service: Endoscopy;  Laterality: N/A;  2:15pm  . CORONARY ANGIOPLASTY  07/2011  . CORONARY ARTERY BYPASS GRAFT N/A 11/08/2013   Procedure: CORONARY ARTERY BYPASS GRAFTING (CABG), on pump, times two, using left internal mammary artery, cryo saphenous vein.;  Surgeon: Ivin Poot, MD;  Location: [facility];  Service: Open Heart  Surgery;  Laterality: N/A;  LIMA-LAD CRYOVEIN -OM  . ESOPHAGOGASTRODUODENOSCOPY  10/16/09   normal without barrett's/three superficial gastric ulcers  . EYE SURGERY     cataracts  . HEMORRHOID SURGERY N/A 06/17/2016   Procedure: EXTENSIVE HEMORRHOIDECTOMY;  Surgeon: Aviva Signs, MD;  Location: [facility];  Service: General;  Laterality: N/A;  . INTRAOPERATIVE TRANSESOPHAGEAL ECHOCARDIOGRAM N/A 11/08/2013   Procedure: INTRAOPERATIVE TRANSESOPHAGEAL ECHOCARDIOGRAM;  Surgeon: Ivin Poot, MD;  Location: [facility];  Service: Open Heart Surgery;  Laterality: N/A;  . LEFT HEART CATHETERIZATION WITH CORONARY ANGIOGRAM N/A 07/19/2011   Procedure: LEFT HEART CATHETERIZATION WITH CORONARY ANGIOGRAM;  Surgeon: Laverda Page, MD;  Location: [facility] CATH LAB;  Service: Cardiovascular;  Laterality: N/A;  . LEFT HEART CATHETERIZATION WITH CORONARY ANGIOGRAM N/A 12/21/2011   Procedure: LEFT HEART CATHETERIZATION WITH CORONARY ANGIOGRAM;  Surgeon: Laverda Page, MD;  Location: [facility] CATH LAB;  Service: Cardiovascular;  Laterality: N/A;  . LEFT HEART CATHETERIZATION WITH CORONARY ANGIOGRAM N/A 02/20/2012   Procedure: LEFT HEART CATHETERIZATION WITH CORONARY ANGIOGRAM;  Surgeon: Laverda Page, MD;  Location: [facility] CATH LAB;  Service: Cardiovascular;  Laterality: N/A;  . LEFT HEART CATHETERIZATION WITH CORONARY ANGIOGRAM N/A 09/03/2012   Procedure: LEFT HEART CATHETERIZATION WITH CORONARY ANGIOGRAM;  Surgeon: Laverda Page, MD;  Location: [facility] CATH LAB;  Service: Cardiovascular;  Laterality: N/A;  . LEFT HEART CATHETERIZATION WITH CORONARY ANGIOGRAM N/A 12/04/2012   Procedure: LEFT HEART CATHETERIZATION WITH CORONARY ANGIOGRAM;  Surgeon: Laverda Page, MD;  Location: [facility] CATH LAB;  Service: Cardiovascular;  Laterality: N/A;  . LEFT HEART CATHETERIZATION WITH CORONARY ANGIOGRAM N/A 01/22/2013   Procedure: LEFT HEART CATHETERIZATION WITH CORONARY ANGIOGRAM;  Surgeon: Laverda Page, MD;  Location: [facility] CATH LAB;   Service: Cardiovascular;  Laterality: N/A;  . LEFT HEART CATHETERIZATION WITH CORONARY ANGIOGRAM N/A 06/26/2013   Procedure: LEFT HEART CATHETERIZATION WITH CORONARY ANGIOGRAM;  Surgeon: Laverda Page, MD;  Location: [facility] CATH LAB;  Service: Cardiovascular;  Laterality: N/A;  . LEFT HEART CATHETERIZATION WITH CORONARY ANGIOGRAM N/A 11/05/2013   Procedure: LEFT HEART CATHETERIZATION WITH CORONARY ANGIOGRAM;  Surgeon: Laverda Page, MD;  Location: [facility] CATH LAB;  Service: Cardiovascular;  Laterality: N/A;  . MITRAL VALVE REPLACEMENT N/A 11/08/2013   Procedure: MITRAL VALVE (MV) REPLACEMENT;  Surgeon: Ivin Poot, MD;  Location: [facility];  Service: Open Heart Surgery;  Laterality: N/A;  #25 MAGNA MITRAL EASE  . PERCUTANEOUS CORONARY STENT INTERVENTION (PCI-S)  09/03/2012   Procedure: PERCUTANEOUS CORONARY STENT INTERVENTION (PCI-S);  Surgeon: Laverda Page, MD;  Location: [facility] CATH LAB;  Service: Cardiovascular;;  . TOE AMPUTATION Left 2013   left second toe amputation  . TONSILLECTOMY      OB History    No data available       Home Medications    Prior to Admission medications   Medication Sig Start Date End Date Taking? Authorizing Provider  ACCU-CHEK FASTCLIX LANCETS MISC USE  TO TEST BLOOD SUGAR  FOUR TIMES DAILY FASTING, BEFORE MEALS  AND AT BEDTIME DUE TO FLUCTUATING BLOOD SUGARS 03/29/16   Susy Frizzle, MD  ACCU-CHEK SMARTVIEW test strip USE  TO TEST BLOOD SUGAR FOUR TIMES DAILY  FASTING, BEFORE MEALS, AND AT BEDTIME DUE TO FLUCTUATING BLOOD SUGARS 06/27/16   Susy Frizzle, MD  ascorbic acid (VITAMIN C) 500 MG tablet Take 1 tablet (500 mg total) by mouth 2 (two) times daily. 06/04/14   Susy Frizzle, MD  aspirin 81 MG chewable tablet Chew 2 tablets (162 mg total) by mouth daily. 06/04/14   Susy Frizzle, MD  B-D ULTRAFINE III SHORT PEN 31G X 8 MM MISC USE  TO INJECT  INSULIN  5  TIMES  DAILY 04/29/16   Susy Frizzle, MD  cetirizine (ZYRTEC) 10 MG tablet Take 1 tablet  (10 mg total) by mouth at bedtime. 06/04/14   Susy Frizzle, MD  doxazosin (CARDURA) 4 MG tablet Take 1 tablet (4 mg total) by mouth daily. 11/27/15   Susy Frizzle, MD  ferrous sulfate 325 (65 FE) MG tablet Take 1 tablet (325 mg total) by mouth 2 (two) times daily with a meal. 06/04/14   Susy Frizzle, MD  fluticasone [facility]) 50 MCG/ACT nasal spray USE 1 SPRAY IN EACH NOSTRIL EVERY DAY 04/29/16   Susy Frizzle, MD  insulin aspart (NOVOLOG FLEXPEN) 100 UNIT/ML FlexPen 70-120=0    251-300=5 121-150=1  301-350=7 151-200=2  351-400=9 201-250=3  400 or >, call MD  units of insulin Patient taking differently: Inject 1-5 Units into the skin 3 (three) times daily as needed. 70-120=0    251-300=5 121-150=1  301-350=7 151-200=2  351-400=9 201-250=3  400 or >, call MD  units of insulin 04/16/15   Susy Frizzle, MD  LANTUS SOLOSTAR 100 UNIT/ML Solostar Pen INJECT 20 UNITS SUBCUTANEOUSLY TWICE DAILY 07/07/16   Susy Frizzle, MD  losartan (COZAAR) 25 MG tablet Take 25 mg by mouth daily.    [provider]  nitroGLYCERIN (NITROSTAT) 0.4 MG SL tablet Place 1 tablet (0.4 mg total) under the tongue every 5 (five) minutes as needed for chest pain. 06/27/14   Susy Frizzle, MD  pantoprazole (PROTONIX) 40 MG tablet TAKE 1 TABLET EVERY DAY 04/29/16   Susy Frizzle, MD  simvastatin (ZOCOR) 20 MG tablet TAKE 1 TABLET EVERY DAY 04/29/16   Susy Frizzle, MD  sulfamethoxazole-trimethoprim (BACTRIM DS,SEPTRA DS) 800-160 MG tablet Take 1 tablet by mouth at bedtime. 08/26/16   Rolland Porter, MD  torsemide (DEMADEX) 20 MG tablet TAKE 1 TABLET TWICE DAILY Patient taking differently: TAKE 1 TABLET  DAILY EACH MORNING 11/16/15   Susy Frizzle, MD  traMADol (ULTRAM) 50 MG tablet Take 1 tablet (50 mg total) by mouth every 6 (six) hours as needed for moderate pain. 07/21/16   Susy Frizzle, MD  traZODone (DESYREL) 50 MG tablet TAKE 1 TABLET AT BEDTIME 04/29/16   Susy Frizzle, MD    Family  History No family history on file.  Social History Social History  Substance Use Topics  . Smoking status: Never Smoker  . Smokeless tobacco: Never Used  . Alcohol use No  lives at home   Allergies   Aspirin   Review of Systems Review of Systems  All other systems reviewed and are negative.    Physical Exam Updated Vital Signs BP (!) 107/45 (BP Location: Right Arm)   Pulse 82   Temp 98 F (36.7 C) (Oral)  Resp 20   Ht 5\' 7"  (1.702 m)   Wt 68 kg (150 lb)   SpO2 98%   BMI 23.49 kg/m   Physical Exam  Constitutional: She is oriented to person, place, and time. She appears well-developed and well-nourished. No distress.  HENT:  Head: Normocephalic and atraumatic.  Right Ear: External ear normal.  Left Ear: External ear normal.  Nose: Nose normal.  Eyes: Conjunctivae and EOM are normal.  Neck: Normal range of motion. Neck supple.  Cardiovascular: Normal rate.   Pulmonary/Chest: Effort normal. No respiratory distress.  Musculoskeletal: Normal range of motion. She exhibits edema and tenderness. She exhibits no deformity.  Patient's left second toe is amputated.  Neurological: She is alert and oriented to person, place, and time. No cranial nerve deficit.  Skin: Skin is warm. There is erythema.  Patient has some superficial abrasions and on lying on her lateral left lower extremity and the lower ones appear to be deeper and there is some redness and more swelling in the lower part of her leg. Skin is warm to touch. She has good distal pulses.  Psychiatric: She has a normal mood and affect. Her behavior is normal. Thought content normal.  Nursing note and vitals reviewed.        ED Treatments / Results  Labs (all labs ordered are listed, but only abnormal results are displayed) Results for orders placed or performed during the hospital encounter of 19/62/22  Basic metabolic panel  Result Value Ref Range   Sodium 131 (L) 135 - 145 mmol/L   Potassium 4.2 3.5 -  5.1 mmol/L   Chloride 100 (L) 101 - 111 mmol/L   CO2 21 (L) 22 - 32 mmol/L   Glucose, Bld 194 (H) 65 - 99 mg/dL   BUN 79 (H) 6 - 20 mg/dL   Creatinine, Ser 2.61 (H) 0.44 - 1.00 mg/dL   Calcium 8.2 (L) 8.9 - 10.3 mg/dL   GFR calc non Af Amer 16 (L) >60 mL/min   GFR calc Af Amer 19 (L) >60 mL/min   Anion gap 10 5 - 15  CBC with Differential  Result Value Ref Range   WBC 3.5 (L) 4.0 - 10.5 K/uL   RBC 2.92 (L) 3.87 - 5.11 MIL/uL   Hemoglobin 8.4 (L) 12.0 - 15.0 g/dL   HCT 25.6 (L) 36.0 - 46.0 %   MCV 87.7 78.0 - 100.0 fL   MCH 28.8 26.0 - 34.0 pg   MCHC 32.8 30.0 - 36.0 g/dL   RDW 15.0 11.5 - 15.5 %   Platelets 173 150 - 400 K/uL   Neutrophils Relative % 40 %   Neutro Abs 1.4 (L) 1.7 - 7.7 K/uL   Lymphocytes Relative 51 %   Lymphs Abs 1.8 0.7 - 4.0 K/uL   Monocytes Relative 6 %   Monocytes Absolute 0.2 0.1 - 1.0 K/uL   Eosinophils Relative 2 %   Eosinophils Absolute 0.1 0.0 - 0.7 K/uL   Basophils Relative 1 %   Basophils Absolute 0.0 0.0 - 0.1 K/uL   Laboratory interpretation all normal except Stable chronic renal insufficiency, mild hyperglycemia without metabolic acidosis, stable anemia with low total white blood cell count without neutropenia    EKG  EKG Interpretation None       Radiology No results found.  Procedures Procedures (including critical care time)  Medications Ordered in ED Medications  sulfamethoxazole-trimethoprim (BACTRIM DS,SEPTRA DS) 800-160 MG per tablet 1 tablet (not administered)  sulfamethoxazole-trimethoprim (BACTRIM DS,SEPTRA DS) 800-160 MG  per tablet 1 tablet (1 tablet Oral Given 08/25/16 2353)     Initial Impression / Assessment and Plan / ED Course  I have reviewed the triage vital signs and the nursing notes.  Pertinent labs & imaging results that were available during my care of the patient were reviewed by me and considered in my medical decision making (see chart for details).   Patient was started on oral Septra DS. Due to  her underlying renal insufficiency pharmacy consult was done who recommend she gets Septra DS once daily at nighttime. We discussed that she may see some mild increased swelling and redness however if she sees rapidly progressing swelling or redness or she gets fever or chills she should be rechecked.  We discussed her anemia and her sons as her primary care doctor is aware of the anemia. I advised him to let the office know tomorrow that little bit lower than her usual baseline. She denies feeling dizzy or lightheaded.  Final Clinical Impressions(s) / ED Diagnoses   Final diagnoses:  Infected abrasion of left lower extremity, initial encounter    New Prescriptions New Prescriptions   SULFAMETHOXAZOLE-TRIMETHOPRIM (BACTRIM DS,SEPTRA DS) 800-160 MG TABLET    Take 1 tablet by mouth at bedtime.    Plan discharge  Rolland Porter, MD, [place], MD 08/26/16 [phone]

## 2016-09-14 DIAGNOSIS — G4734 Idiopathic sleep related nonobstructive alveolar hypoventilation: Secondary | ICD-10-CM | POA: Diagnosis not present

## 2016-09-16 DIAGNOSIS — I2510 Atherosclerotic heart disease of native coronary artery without angina pectoris: Secondary | ICD-10-CM | POA: Diagnosis not present

## 2016-09-16 DIAGNOSIS — I5032 Chronic diastolic (congestive) heart failure: Secondary | ICD-10-CM | POA: Diagnosis not present

## 2016-09-16 DIAGNOSIS — I10 Essential (primary) hypertension: Secondary | ICD-10-CM | POA: Diagnosis not present

## 2016-09-16 DIAGNOSIS — E1122 Type 2 diabetes mellitus with diabetic chronic kidney disease: Secondary | ICD-10-CM | POA: Diagnosis not present

## 2016-09-28 ENCOUNTER — Other Ambulatory Visit: Payer: Self-pay | Admitting: Family Medicine

## 2016-09-28 DIAGNOSIS — N184 Chronic kidney disease, stage 4 (severe): Secondary | ICD-10-CM

## 2016-09-28 DIAGNOSIS — I482 Chronic atrial fibrillation, unspecified: Secondary | ICD-10-CM

## 2016-09-28 DIAGNOSIS — I10 Essential (primary) hypertension: Secondary | ICD-10-CM

## 2016-09-28 DIAGNOSIS — E7800 Pure hypercholesterolemia, unspecified: Secondary | ICD-10-CM

## 2016-09-28 DIAGNOSIS — E875 Hyperkalemia: Secondary | ICD-10-CM

## 2016-09-28 DIAGNOSIS — E1122 Type 2 diabetes mellitus with diabetic chronic kidney disease: Secondary | ICD-10-CM

## 2016-09-28 DIAGNOSIS — D509 Iron deficiency anemia, unspecified: Secondary | ICD-10-CM

## 2016-09-30 ENCOUNTER — Encounter: Payer: Self-pay | Admitting: Family Medicine

## 2016-09-30 ENCOUNTER — Other Ambulatory Visit: Payer: Medicare HMO

## 2016-09-30 DIAGNOSIS — D509 Iron deficiency anemia, unspecified: Secondary | ICD-10-CM | POA: Diagnosis not present

## 2016-09-30 DIAGNOSIS — E7800 Pure hypercholesterolemia, unspecified: Secondary | ICD-10-CM

## 2016-09-30 DIAGNOSIS — E875 Hyperkalemia: Secondary | ICD-10-CM | POA: Diagnosis not present

## 2016-09-30 DIAGNOSIS — I10 Essential (primary) hypertension: Secondary | ICD-10-CM | POA: Diagnosis not present

## 2016-09-30 DIAGNOSIS — I482 Chronic atrial fibrillation, unspecified: Secondary | ICD-10-CM

## 2016-09-30 DIAGNOSIS — N184 Chronic kidney disease, stage 4 (severe): Secondary | ICD-10-CM

## 2016-09-30 DIAGNOSIS — E1122 Type 2 diabetes mellitus with diabetic chronic kidney disease: Secondary | ICD-10-CM | POA: Diagnosis not present

## 2016-09-30 LAB — CBC WITH DIFFERENTIAL/PLATELET
BASOS ABS: 35 {cells}/uL (ref 0–200)
BASOS PCT: 1 %
EOS ABS: 0 {cells}/uL — AB (ref 15–500)
Eosinophils Relative: 0 %
HEMATOCRIT: 23.5 % — AB (ref 35.0–45.0)
Hemoglobin: 7.6 g/dL — CL (ref 12.0–15.0)
LYMPHS PCT: 49 %
Lymphs Abs: 1715 cells/uL (ref 850–3900)
MCH: 28.4 pg (ref 27.0–33.0)
MCHC: 32.3 g/dL (ref 32.0–36.0)
MCV: 87.7 fL (ref 80.0–100.0)
MONO ABS: 315 {cells}/uL (ref 200–950)
MPV: 9.1 fL (ref 7.5–12.5)
Monocytes Relative: 9 %
NEUTROS PCT: 41 %
Neutro Abs: 1435 cells/uL — ABNORMAL LOW (ref 1500–7800)
PLATELETS: 179 10*3/uL (ref 140–400)
RBC: 2.68 MIL/uL — ABNORMAL LOW (ref 3.80–5.10)
RDW: 15.8 % — AB (ref 11.0–15.0)
WBC: 3.5 10*3/uL — ABNORMAL LOW (ref 3.8–10.8)

## 2016-09-30 NOTE — Progress Notes (Signed)
Paged by [facility] with hgb of 7.6.  Was 8.4 1 month ago and just over 9 3 months ago suggesting a slow chronic blood loss.  Unable to reach the patient by telephone but did call and left voice message with her son Preston Weill.  Recommended that if she is having any symptoms (CP/SOB/syncope) to go to the ER ASAP.  If asymptomatic, stop her aspirin and come to office Monday so I can arrange outpatient transfusion and GI consult.

## 2016-10-01 LAB — LIPID PANEL
CHOLESTEROL: 91 mg/dL (ref <200)
HDL: 31 mg/dL — AB (ref >50)
LDL Cholesterol: 46 mg/dL (ref <100)
TRIGLYCERIDES: 68 mg/dL (ref <150)
Total CHOL/HDL Ratio: 2.9 Ratio (ref <5.0)
VLDL: 14 mg/dL (ref <30)

## 2016-10-01 LAB — COMPREHENSIVE METABOLIC PANEL
ALK PHOS: 121 U/L (ref 33–130)
ALT: 15 U/L (ref 6–29)
AST: 22 U/L (ref 10–35)
Albumin: 3.2 g/dL — ABNORMAL LOW (ref 3.6–5.1)
BUN: 63 mg/dL — ABNORMAL HIGH (ref 7–25)
CALCIUM: 8.4 mg/dL — AB (ref 8.6–10.4)
CHLORIDE: 103 mmol/L (ref 98–110)
CO2: 15 mmol/L — ABNORMAL LOW (ref 20–31)
Creat: 2.47 mg/dL — ABNORMAL HIGH (ref 0.60–0.88)
GLUCOSE: 103 mg/dL — AB (ref 70–99)
POTASSIUM: 4.8 mmol/L (ref 3.5–5.3)
Sodium: 131 mmol/L — ABNORMAL LOW (ref 135–146)
Total Bilirubin: 0.7 mg/dL (ref 0.2–1.2)
Total Protein: 7.3 g/dL (ref 6.1–8.1)

## 2016-10-01 LAB — IRON,TIBC AND FERRITIN PANEL
%SAT: 15 % (ref 11–50)
Ferritin: 134 ng/mL (ref 20–288)
Iron: 42 ug/dL — ABNORMAL LOW (ref 45–160)
TIBC: 283 ug/dL (ref 250–450)

## 2016-10-01 LAB — HEMOGLOBIN A1C
Hgb A1c MFr Bld: 5.2 % (ref <5.7)
Mean Plasma Glucose: 103 mg/dL

## 2016-10-03 ENCOUNTER — Encounter (HOSPITAL_COMMUNITY)
Admission: RE | Admit: 2016-10-03 | Discharge: 2016-10-03 | Disposition: A | Discharge status: Home / Self Care | Payer: Medicare HMO | Source: Ambulatory Visit | Attending: Family Medicine | Admitting: Family Medicine

## 2016-10-03 DIAGNOSIS — D509 Iron deficiency anemia, unspecified: Secondary | ICD-10-CM | POA: Insufficient documentation

## 2016-10-03 LAB — ABO/RH: ABO/RH(D): O POS

## 2016-10-03 LAB — PREPARE RBC (CROSSMATCH)

## 2016-10-04 ENCOUNTER — Encounter (HOSPITAL_COMMUNITY)
Admission: RE | Admit: 2016-10-04 | Discharge: 2016-10-04 | Disposition: A | Discharge status: Home / Self Care | Payer: Medicare HMO | Source: Ambulatory Visit | Attending: Family Medicine | Admitting: Family Medicine

## 2016-10-04 DIAGNOSIS — D509 Iron deficiency anemia, unspecified: Secondary | ICD-10-CM | POA: Diagnosis not present

## 2016-10-04 MED ORDER — SODIUM CHLORIDE 0.9 % IV SOLN
Freq: Once | INTRAVENOUS | Status: AC
  Administered 2016-10-04: 250 mL via INTRAVENOUS

## 2016-10-05 LAB — TYPE AND SCREEN
ABO/RH(D): O POS
Antibody Screen: NEGATIVE
UNIT DIVISION: 0
Unit division: 0

## 2016-10-05 LAB — BPAM RBC
BLOOD PRODUCT EXPIRATION DATE: 201808312359
BLOOD PRODUCT EXPIRATION DATE: 201808312359
ISSUE DATE / TIME: 201807310853
ISSUE DATE / TIME: 201807311048
UNIT TYPE AND RH: 5100
Unit Type and Rh: 5100

## 2016-10-09 ENCOUNTER — Emergency Department (HOSPITAL_COMMUNITY): Payer: Medicare HMO

## 2016-10-09 ENCOUNTER — Encounter (HOSPITAL_COMMUNITY): Payer: Self-pay | Admitting: *Deleted

## 2016-10-09 ENCOUNTER — Emergency Department (HOSPITAL_COMMUNITY)
Admission: EM | Admit: 2016-10-09 | Discharge: 2016-10-09 | Disposition: A | Discharge status: Home / Self Care | Payer: Medicare HMO | Attending: Emergency Medicine | Admitting: Emergency Medicine

## 2016-10-09 ENCOUNTER — Other Ambulatory Visit: Payer: Self-pay

## 2016-10-09 DIAGNOSIS — I252 Old myocardial infarction: Secondary | ICD-10-CM | POA: Insufficient documentation

## 2016-10-09 DIAGNOSIS — Z79899 Other long term (current) drug therapy: Secondary | ICD-10-CM | POA: Diagnosis not present

## 2016-10-09 DIAGNOSIS — I2510 Atherosclerotic heart disease of native coronary artery without angina pectoris: Secondary | ICD-10-CM | POA: Insufficient documentation

## 2016-10-09 DIAGNOSIS — I5030 Unspecified diastolic (congestive) heart failure: Secondary | ICD-10-CM | POA: Insufficient documentation

## 2016-10-09 DIAGNOSIS — I110 Hypertensive heart disease with heart failure: Secondary | ICD-10-CM | POA: Diagnosis not present

## 2016-10-09 DIAGNOSIS — R4182 Altered mental status, unspecified: Secondary | ICD-10-CM | POA: Diagnosis present

## 2016-10-09 DIAGNOSIS — E1122 Type 2 diabetes mellitus with diabetic chronic kidney disease: Secondary | ICD-10-CM | POA: Diagnosis not present

## 2016-10-09 DIAGNOSIS — N184 Chronic kidney disease, stage 4 (severe): Secondary | ICD-10-CM | POA: Insufficient documentation

## 2016-10-09 DIAGNOSIS — R918 Other nonspecific abnormal finding of lung field: Secondary | ICD-10-CM | POA: Diagnosis not present

## 2016-10-09 DIAGNOSIS — I129 Hypertensive chronic kidney disease with stage 1 through stage 4 chronic kidney disease, or unspecified chronic kidney disease: Secondary | ICD-10-CM | POA: Insufficient documentation

## 2016-10-09 DIAGNOSIS — R410 Disorientation, unspecified: Secondary | ICD-10-CM | POA: Diagnosis not present

## 2016-10-09 LAB — CBC WITH DIFFERENTIAL/PLATELET
BASOS ABS: 0 10*3/uL (ref 0.0–0.1)
Basophils Relative: 0 %
EOS ABS: 0 10*3/uL (ref 0.0–0.7)
EOS PCT: 0 %
HCT: 28.7 % — ABNORMAL LOW (ref 36.0–46.0)
Hemoglobin: 9.8 g/dL — ABNORMAL LOW (ref 12.0–15.0)
LYMPHS ABS: 1.6 10*3/uL (ref 0.7–4.0)
LYMPHS PCT: 25 %
MCH: 29.4 pg (ref 26.0–34.0)
MCHC: 34.1 g/dL (ref 30.0–36.0)
MCV: 86.2 fL (ref 78.0–100.0)
MONO ABS: 0.3 10*3/uL (ref 0.1–1.0)
Monocytes Relative: 5 %
Neutro Abs: 4.5 10*3/uL (ref 1.7–7.7)
Neutrophils Relative %: 70 %
PLATELETS: 162 10*3/uL (ref 150–400)
RBC: 3.33 MIL/uL — ABNORMAL LOW (ref 3.87–5.11)
RDW: 15.4 % (ref 11.5–15.5)
WBC: 6.4 10*3/uL (ref 4.0–10.5)

## 2016-10-09 LAB — PROTIME-INR
INR: 1.26
Prothrombin Time: 15.9 seconds — ABNORMAL HIGH (ref 11.4–15.2)

## 2016-10-09 LAB — COMPREHENSIVE METABOLIC PANEL
ALBUMIN: 3 g/dL — AB (ref 3.5–5.0)
ALT: 14 U/L (ref 14–54)
AST: 24 U/L (ref 15–41)
Alkaline Phosphatase: 115 U/L (ref 38–126)
Anion gap: 10 (ref 5–15)
BILIRUBIN TOTAL: 1.2 mg/dL (ref 0.3–1.2)
BUN: 64 mg/dL — AB (ref 6–20)
CO2: 19 mmol/L — ABNORMAL LOW (ref 22–32)
CREATININE: 2.22 mg/dL — AB (ref 0.44–1.00)
Calcium: 8.3 mg/dL — ABNORMAL LOW (ref 8.9–10.3)
Chloride: 102 mmol/L (ref 101–111)
GFR calc Af Amer: 23 mL/min — ABNORMAL LOW (ref >60)
GFR calc non Af Amer: 19 mL/min — ABNORMAL LOW (ref >60)
GLUCOSE: 65 mg/dL (ref 65–99)
POTASSIUM: 4.2 mmol/L (ref 3.5–5.1)
Sodium: 131 mmol/L — ABNORMAL LOW (ref 135–145)
TOTAL PROTEIN: 7.6 g/dL (ref 6.5–8.1)

## 2016-10-09 LAB — URINALYSIS, ROUTINE W REFLEX MICROSCOPIC
BILIRUBIN URINE: NEGATIVE
Glucose, UA: NEGATIVE mg/dL
HGB URINE DIPSTICK: NEGATIVE
Ketones, ur: NEGATIVE mg/dL
Leukocytes, UA: NEGATIVE
Nitrite: NEGATIVE
PH: 5.5 (ref 5.0–8.0)
Protein, ur: NEGATIVE mg/dL
SPECIFIC GRAVITY, URINE: 1.01 (ref 1.005–1.030)

## 2016-10-09 LAB — CG4 I-STAT (LACTIC ACID): Lactic Acid, Venous: 0.53 mmol/L (ref 0.5–1.9)

## 2016-10-09 MED ORDER — CEFTRIAXONE SODIUM 1 G IJ SOLR
1.0000 g | Freq: Once | INTRAMUSCULAR | Status: AC
  Administered 2016-10-09: 1 g via INTRAVENOUS
  Filled 2016-10-09: qty 10

## 2016-10-09 MED ORDER — SODIUM CHLORIDE 0.9 % IV BOLUS (SEPSIS)
2000.0000 mL | Freq: Once | INTRAVENOUS | Status: AC
  Administered 2016-10-09: 2000 mL via INTRAVENOUS

## 2016-10-09 MED ORDER — ACETAMINOPHEN 325 MG PO TABS
650.0000 mg | ORAL_TABLET | Freq: Once | ORAL | Status: AC
  Administered 2016-10-09: 650 mg via ORAL
  Filled 2016-10-09: qty 2

## 2016-10-09 NOTE — ED Provider Notes (Signed)
[facility] DEPT Provider Note   CSN: [id number] Arrival date & time: 10/09/16  1541     History   Chief Complaint Chief Complaint  Patient presents with  . Altered Mental Status    HPI Claudia [name] is a 83 y.o. female.   Altered Mental Status   This is a new problem. The current episode started 1 to 2 hours ago. The problem has not changed since onset.Associated symptoms include confusion. Risk factors include a recent illness. Her past medical history does not include seizures.    Past Medical History:  Diagnosis Date  . Anemia   . Arthritis   . Cancer (HCC)    hx of skin cancer  . CHF (congestive heart failure) (HCC)    diastolic   . CKD (chronic kidney disease) stage 4, GFR 15-29 ml/min (HCC)   . Coronary artery disease   . Diabetes mellitus    insulin dependent  . GERD (gastroesophageal reflux disease)   . Headache(784.0)    rare migraines  . Hypercholesterolemia   . Hypertension   . Myocardial infarction ([place]) 2013  . Osteoporosis   . Proliferative retinopathy due to DM ([place])   . PUD (peptic ulcer disease)   . S/P colonoscopy Sept 2011   left-sided diverticula, tubular adenoma  . S/P endoscopy Aug 2011   3 superficial gastric ulcers, NSAID-induced  . Shortness of breath   . SIADH (syndrome of inappropriate ADH production) [facility])     Patient Active Problem List   Diagnosis Date Noted  . Internal and external prolapsed hemorrhoids   . Atherosclerosis of aorta ([place]) 02/24/2015  . Infection with ESBL Klebsiella oxytoca 06/20/2014  . Acute encephalopathy 06/18/2014  . Elevated troponin 06/17/2014  . UTI (lower urinary tract infection) 06/17/2014  . Altered mental status 02/24/2014  . Aphasia   . Urinary tract infectious disease   . UTI (urinary tract infection) 02/23/2014  . CKD (chronic kidney disease) stage 4, GFR 15-29 ml/min (HCC) 12/24/2013  . Fluid overload 12/24/2013  . Volume overload 12/23/2013  . S/P mitral valve replacement with  bioprosthetic valve 12/09/2013  . Atrial fibrillation ([place]) 12/09/2013  . TIA (transient ischemic attack) 12/01/2013  . S/P CABG x 2 11/08/2013  . Unstable angina pectoris ([place]) 11/05/2013  . ACS (acute coronary syndrome) ([place]) 06/26/2013  . NSTEMI (non-ST elevated myocardial infarction) ([place]) 06/26/2013  . CHF (congestive heart failure) ([place]) 06/25/2012  . Hypercholesterolemia   . Osteopenia   . Unstable angina ([place]) 12/21/2011  . Osteomyelitis of toe ([place]) 10/26/2011  . ARF (acute renal failure) ([place]) 10/26/2011  . Hyperkalemia 10/26/2011  . Diarrhea 07/29/2011  . Hyponatremia 07/28/2011  . Nausea and vomiting 07/28/2011  . Benign hypertension 07/28/2011  . DM (diabetes mellitus), type 2 ([place]) 07/28/2011  . CAD (coronary artery disease), native coronary artery 07/20/2011  . GERD 12/28/2009  . PUD, HX OF 12/28/2009  . COLONIC POLYPS, ADENOMATOUS, HX OF 12/28/2009  . ANEMIA 10/22/2009    Past Surgical History:  Procedure Laterality Date  . APPENDECTOMY    . CARDIAC CATHETERIZATION  01/22/2013  . cardiac stents  07/19/2011  . COLONOSCOPY  12/04/09   normal rectum/left-sided diverticula  . COLONOSCOPY N/A 05/26/2016   Procedure: COLONOSCOPY;  Surgeon: Daneil Dolin, MD;  [facility];  Service: Endoscopy;  Laterality: N/A;  2:15pm  . CORONARY ANGIOPLASTY  07/2011  . CORONARY ARTERY BYPASS GRAFT N/A 11/08/2013   Procedure: CORONARY ARTERY BYPASS GRAFTING (CABG), on pump, times two, using left internal  mammary artery, cryo saphenous vein.;  Surgeon: Ivin Poot, MD;  Location: [facility];  Service: Open Heart Surgery;  Laterality: N/A;  LIMA-LAD CRYOVEIN -OM  . ESOPHAGOGASTRODUODENOSCOPY  10/16/09   normal without barrett's/three superficial gastric ulcers  . EYE SURGERY     cataracts  . HEMORRHOID SURGERY N/A 06/17/2016   Procedure: EXTENSIVE HEMORRHOIDECTOMY;  Surgeon: Aviva Signs, MD;  Location: [facility] ORS;  Service: General;  Laterality: N/A;  . INTRAOPERATIVE  TRANSESOPHAGEAL ECHOCARDIOGRAM N/A 11/08/2013   Procedure: INTRAOPERATIVE TRANSESOPHAGEAL ECHOCARDIOGRAM;  Surgeon: Ivin Poot, MD;  Location: [facility];  Service: Open Heart Surgery;  Laterality: N/A;  . LEFT HEART CATHETERIZATION WITH CORONARY ANGIOGRAM N/A 07/19/2011   Procedure: LEFT HEART CATHETERIZATION WITH CORONARY ANGIOGRAM;  Surgeon: Laverda Page, MD;  Location: [facility] C[facility] LAB;  Service: Cardiovascular;  Laterality: N/A;  . LEFT HEART CATHETERIZATION WITH CORONARY ANGIOGRAM N/A 12/21/2011   Procedure: LEFT HEART CATHETERIZATION WITH CORONARY ANGIOGRAM;  Surgeon: Laverda Page, MD;  Location: [facility] CATH LAB;  Service: Cardiovascular;  Laterality: N/A;  . LEFT HEART CATHETERIZATION WITH CORONARY ANGIOGRAM N/A 02/20/2012   Procedure: LEFT HEART CATHETERIZATION WITH CORONARY ANGIOGRAM;  Surgeon: Laverda Page, MD;  Location: [facility] CATH LAB;  Service: Cardiovascular;  Laterality: N/A;  . LEFT HEART CATHETERIZATION WITH CORONARY ANGIOGRAM N/A 09/03/2012   Procedure: LEFT HEART CATHETERIZATION WITH CORONARY ANGIOGRAM;  Surgeon: Laverda Page, MD;  Location: [facility] CATH LAB;  Service: Cardiovascular;  Laterality: N/A;  . LEFT HEART CATHETERIZATION WITH CORONARY ANGIOGRAM N/A 12/04/2012   Procedure: LEFT HEART CATHETERIZATION WITH CORONARY ANGIOGRAM;  Surgeon: Laverda Page, MD;  Location: [facility] CATH LAB;  Service: Cardiovascular;  Laterality: N/A;  . LEFT HEART CATHETERIZATION WITH CORONARY ANGIOGRAM N/A 01/22/2013   Procedure: LEFT HEART CATHETERIZATION WITH CORONARY ANGIOGRAM;  Surgeon: Laverda Page, MD;  Location: [facility] CATH LAB;  Service: Cardiovascular;  Laterality: N/A;  . LEFT HEART CATHETERIZATION WITH CORONARY ANGIOGRAM N/A 06/26/2013   Procedure: LEFT HEART CATHETERIZATION WITH CORONARY ANGIOGRAM;  Surgeon: Laverda Page, MD;  Location: [facility] CATH LAB;  Service: Cardiovascular;  Laterality: N/A;  . LEFT HEART CATHETERIZATION WITH CORONARY ANGIOGRAM N/A 11/05/2013   Procedure: LEFT HEART  CATHETERIZATION WITH CORONARY ANGIOGRAM;  Surgeon: Laverda Page, MD;  Location: [facility] CATH LAB;  Service: Cardiovascular;  Laterality: N/A;  . MITRAL VALVE REPLACEMENT N/A 11/08/2013   Procedure: MITRAL VALVE (MV) REPLACEMENT;  Surgeon: Ivin Poot, MD;  Location: [facility];  Service: Open Heart Surgery;  Laterality: N/A;  #25 MAGNA MITRAL EASE  . PERCUTANEOUS CORONARY STENT INTERVENTION (PCI-S)  09/03/2012   Procedure: PERCUTANEOUS CORONARY STENT INTERVENTION (PCI-S);  Surgeon: Laverda Page, MD;  Location: [facility] CATH LAB;  Service: Cardiovascular;;  . TOE AMPUTATION Left 2013   left second toe amputation  . TONSILLECTOMY      OB History    No data available       Home Medications    Prior to Admission medications   Medication Sig Start Date End Date Taking? Authorizing Provider  ascorbic acid (VITAMIN C) 500 MG tablet Take 1 tablet (500 mg total) by mouth 2 (two) times daily. 06/04/14  Yes Susy Frizzle, MD  cetirizine (ZYRTEC) 10 MG tablet Take 1 tablet (10 mg total) by mouth at bedtime. 06/04/14  Yes Susy Frizzle, MD  doxazosin (CARDURA) 4 MG tablet Take 1 tablet (4 mg total) by mouth daily. 11/27/15  Yes Susy Frizzle, MD  ferrous sulfate 325 (65 FE) MG tablet Take 1  tablet (325 mg total) by mouth 2 (two) times daily with a meal. 06/04/14  Yes Pickard, Cammie Mcgee, MD  fluticasone (FLONASE) 50 MCG/ACT nasal spray USE 1 SPRAY IN EACH NOSTRIL EVERY DAY Patient taking differently: USE 1 SPRAY IN EACH NOSTRIL EVERY DAY AS NEEDED FOR ALLERGIES 04/29/16  Yes Susy Frizzle, MD  insulin aspart (NOVOLOG FLEXPEN) 100 UNIT/ML FlexPen 70-120=0    251-300=5 121-150=1  301-350=7 151-200=2  351-400=9 201-250=3  400 or >, call MD  units of insulin Patient taking differently: Inject 1-5 Units into the skin 3 (three) times daily as needed. 70-120=0    251-300=5 121-150=1  301-350=7 151-200=2  351-400=9 201-250=3  400 or >, call MD  units of insulin 04/16/15  Yes Pickard, Cammie Mcgee, MD  LANTUS  SOLOSTAR 100 UNIT/ML Solostar Pen INJECT 20 UNITS SUBCUTANEOUSLY TWICE DAILY Patient taking differently: INJECT 20 UNITS SUBCUTANEOUSLY IN THE MORNING 07/07/16  Yes Susy Frizzle, MD  losartan (COZAAR) 25 MG tablet Take 25 mg by mouth daily.   Yes [provider]  nitroGLYCERIN (NITROSTAT) 0.4 MG SL tablet Place 1 tablet (0.4 mg total) under the tongue every 5 (five) minutes as needed for chest pain. 06/27/14  Yes Susy Frizzle, MD  pantoprazole (PROTONIX) 40 MG tablet TAKE 1 TABLET EVERY DAY 04/29/16  Yes Susy Frizzle, MD  simvastatin (ZOCOR) 20 MG tablet TAKE 1 TABLET EVERY DAY 04/29/16  Yes Susy Frizzle, MD  torsemide (DEMADEX) 20 MG tablet TAKE 1 TABLET TWICE DAILY Patient taking differently: TAKE 1 TABLET  DAILY EACH MORNING 11/16/15  Yes Susy Frizzle, MD  traZODone (DESYREL) 50 MG tablet TAKE 1 TABLET AT BEDTIME 04/29/16  Yes Susy Frizzle, MD  ACCU-CHEK FASTCLIX LANCETS MISC USE  TO TEST BLOOD SUGAR FOUR TIMES DAILY FASTING, BEFORE MEALS  AND AT BEDTIME DUE TO FLUCTUATING BLOOD SUGARS 03/29/16   Susy Frizzle, MD  ACCU-CHEK SMARTVIEW test strip USE  TO TEST BLOOD SUGAR FOUR TIMES DAILY  FASTING, BEFORE MEALS, AND AT BEDTIME DUE TO FLUCTUATING BLOOD SUGARS 06/27/16   Susy Frizzle, MD  B-D ULTRAFINE III SHORT PEN 31G X 8 MM MISC USE  TO INJECT  INSULIN  5  TIMES  DAILY 04/29/16   Susy Frizzle, MD  traMADol (ULTRAM) 50 MG tablet Take 1 tablet (50 mg total) by mouth every 6 (six) hours as needed for moderate pain. 07/21/16   Susy Frizzle, MD    Family History No family history on file.  Social History Social History  Substance Use Topics  . Smoking status: Never Smoker  . Smokeless tobacco: Never Used  . Alcohol use No     Allergies   Aspirin   Review of Systems Review of Systems  Genitourinary: Positive for dysuria.  Psychiatric/Behavioral: Positive for confusion.  All other systems reviewed and are negative.    Physical  Exam Updated Vital Signs BP (!) 101/48   Pulse 66   Temp 98.7 F (37.1 C) (Oral)   Resp (!) 22   Ht 5\' 7"  (1.702 m)   Wt 69.4 kg (153 lb)   SpO2 95%   BMI 23.96 kg/m   Physical Exam  Constitutional: She appears well-developed and well-nourished.  HENT:  Head: Normocephalic and atraumatic.  Eyes: Conjunctivae and EOM are normal.  Neck: Normal range of motion.  Cardiovascular: Normal rate and regular rhythm.   Pulmonary/Chest: No stridor. No respiratory distress.  Abdominal: She exhibits no distension. There is tenderness (suprapubic).  Musculoskeletal:  Normal range of motion. She exhibits no edema or deformity.  Neurological: She is alert.  Skin: Skin is warm and dry.  Nursing note and vitals reviewed.    ED Treatments / Results  Labs (all labs ordered are listed, but only abnormal results are displayed) Labs Reviewed  COMPREHENSIVE METABOLIC PANEL - Abnormal; Notable for the following:       Result Value   Sodium 131 (*)    CO2 19 (*)    BUN 64 (*)    Creatinine, Ser 2.22 (*)    Calcium 8.3 (*)    Albumin 3.0 (*)    GFR calc non Af Amer 19 (*)    GFR calc Af Amer 23 (*)    All other components within normal limits  CBC WITH DIFFERENTIAL/PLATELET - Abnormal; Notable for the following:    RBC 3.33 (*)    Hemoglobin 9.8 (*)    HCT 28.7 (*)    All other components within normal limits  PROTIME-INR - Abnormal; Notable for the following:    Prothrombin Time 15.9 (*)    All other components within normal limits  CULTURE, BLOOD (ROUTINE X 2)  CULTURE, BLOOD (ROUTINE X 2)  URINALYSIS, ROUTINE W REFLEX MICROSCOPIC  I-STAT CG4 LACTIC ACID, ED  CG4 I-STAT (LACTIC ACID)    EKG  EKG Interpretation None       Radiology Dg Chest Port 1 View  Result Date: 10/09/2016 CLINICAL DATA:  Suspected sepsis, increased confusion, altered mental status and fever. EXAM: PORTABLE CHEST 1 VIEW COMPARISON:  Chest radiograph June 17, 2014 FINDINGS: Cardiac silhouette is mildly  enlarged. Status post median sternotomy. Multiple coronary artery stents present. Status post cardiac valve replacement. Similar LEFT midlung zone scarring. Similar interstitial prominence and pulmonary vascular congestion. No pleural effusion or or focal consolidation. Similar pleural scarring LEFT lung base. No pneumothorax. Soft tissue planes and included osseous structures are unchanged. IMPRESSION: Stable cardiomegaly and mild interstitial prominence favoring pulmonary edema. Stable LEFT lung scarring. Electronically Signed   By: Elon Alas M.D.   On: 10/09/2016 16:46    Procedures Procedures (including critical care time)  Medications Ordered in ED Medications  sodium chloride 0.9 % bolus 2,000 mL (0 mLs Intravenous Stopped 10/09/16 1918)  cefTRIAXone (ROCEPHIN) 1 g in dextrose 5 % 50 mL IVPB (0 g Intravenous Stopped 10/09/16 1729)  acetaminophen (TYLENOL) tablet 650 mg (650 mg Oral Given 10/09/16 1750)     Initial Impression / Assessment and Plan / ED Course  I have reviewed the triage vital signs and the nursing notes.  Pertinent labs & imaging results that were available during my care of the patient were reviewed by me and considered in my medical decision making (see chart for details).     Patient quickly back to baseline when fever improved. No clear source of infection. Cultures sent. Observed for multiple hours and continued to do well. No recurrent fever, no change in MS here. Able to ambulate. Tolerating PO. Unsure of cause of confusion earlier in day but son will take to PCP tomorrow for recheck and return here for new/worsening symptoms.   Final Clinical Impressions(s) / ED Diagnoses   Final diagnoses:  Confusion      Aaleyah Witherow, Corene Cornea, MD 10/09/16 2337

## 2016-10-09 NOTE — ED Notes (Signed)
Patient ambulated pushing wheelchair around nurses station with no difficulty.

## 2016-10-09 NOTE — ED Triage Notes (Addendum)
Pt's son c/o increased confusion that started yesterday. Son reports pt always has some slight confusion. Pt denies weakness and pain. Temp 99.9 in triage. Son reports pt c/o dysuria today.

## 2016-10-11 ENCOUNTER — Ambulatory Visit (INDEPENDENT_AMBULATORY_CARE_PROVIDER_SITE_OTHER): Payer: Medicare HMO | Admitting: Family Medicine

## 2016-10-11 VITALS — BP 100/48 | HR 64 | Temp 98.3°F | Resp 16 | Ht 67.0 in | Wt 163.0 lb

## 2016-10-11 DIAGNOSIS — R509 Fever, unspecified: Secondary | ICD-10-CM

## 2016-10-11 DIAGNOSIS — L03317 Cellulitis of buttock: Secondary | ICD-10-CM

## 2016-10-11 DIAGNOSIS — D649 Anemia, unspecified: Secondary | ICD-10-CM | POA: Diagnosis not present

## 2016-10-11 DIAGNOSIS — N184 Chronic kidney disease, stage 4 (severe): Secondary | ICD-10-CM | POA: Diagnosis not present

## 2016-10-11 MED ORDER — SULFAMETHOXAZOLE-TRIMETHOPRIM 400-80 MG PO TABS: 1.0000 | ORAL_TABLET | Freq: Two times a day (BID) | ORAL | 0 refills | Status: DC

## 2016-10-11 NOTE — Progress Notes (Signed)
Subjective:    Patient ID: Claudia [name], female    DOB: 1931-04-15, 83 y.o.   MRN: [id number]  HPI 04/15/16 Patient feels slightly better since discontinuing Cardura. However she continues to remain bradycardic with heart rate between 48 and 58. The home health care nurse reports a blood pressure of 100/60 with heart rate of 48 this morning. Patient continues to have orthostatic dizziness and feels unsteady on her feet. Furthermore she reports several episodes of bright red blood per rectum at times it can be heavy. She has a history of hemorrhoids however the amount of blood that she describes sounds like it may be more from diverticulosis  10/11/16 Since I last saw the patient, she underwent hemorrhoid surgery in April. She continues to have occasional bloody stools. Recently, I was paged over the weekend with a critically low hemoglobin of 7.6. Review of her chart shows there had been a gradual steady decline in her hemoglobin over the summer. We arranged for the patient received 2 units of pack red blood through short stay. Her hemoglobin has since come up to 9.8 post transfusion.  However we have not isolated the cause of her anemia. She continues to endorse occasional bloody bowel movements. She also has chronic kidney disease stage IV which could contribute to her anemia. She has not yet had iron studies and B12 studies since I was called that evening. Unfortunately, the patient had to go to the hospital/emergency room over the weekend with altered mental status. Apparently, her son found her in the kitchen urinating. She was confused and thought she was in the bathroom. Patient was febrile. She was rushed to the hospital. In the hospital, chest x-ray revealed no source of infection. Urinalysis was negative. CBC and CMP reveal no obvious abnormalities other than her anemia which was improved. She was given 1 g of Rocephin and then was discharged home after receiving IV fluids. Yesterday she was  afebrile. However today she seems lethargic and more confused. Her son and the patient deny any obvious source of infection. She denies any cough or shortness of breath. She denies any abdominal pain nausea vomiting or diarrhea. She does report pain with defecation. Therefore I examined the patient's rectal area. The skin around her rectum is bright red erythematous and warm. Is also tender to palpation and appears to be cellulitis. There is no perirectal abscess ear there is no fluctuance or drainage. Please see the diagram  Past Medical History:  Diagnosis Date  . Anemia   . Arthritis   . Cancer (HCC)    hx of skin cancer  . CHF (congestive heart failure) (HCC)    diastolic   . CKD (chronic kidney disease) stage 4, GFR 15-29 ml/min (HCC)   . Coronary artery disease   . Diabetes mellitus    insulin dependent  . GERD (gastroesophageal reflux disease)   . Headache(784.0)    rare migraines  . Hypercholesterolemia   . Hypertension   . Myocardial infarction ([place]) 2013  . Osteoporosis   . Proliferative retinopathy due to DM ([place])   . PUD (peptic ulcer disease)   . S/P colonoscopy Sept 2011   left-sided diverticula, tubular adenoma  . S/P endoscopy Aug 2011   3 superficial gastric ulcers, NSAID-induced  . Shortness of breath   . SIADH (syndrome of inappropriate ADH production) ([place])    Past Surgical History:  Procedure Laterality Date  . APPENDECTOMY    . CARDIAC CATHETERIZATION  01/22/2013  . cardiac  stents  07/19/2011  . COLONOSCOPY  12/04/09   normal rectum/left-sided diverticula  . COLONOSCOPY N/A 05/26/2016   Procedure: COLONOSCOPY;  Surgeon: Daneil Dolin, MD;  [facility];  Service: Endoscopy;  Laterality: N/A;  2:15pm  . CORONARY ANGIOPLASTY  07/2011  . CORONARY ARTERY BYPASS GRAFT N/A 11/08/2013   Procedure: CORONARY ARTERY BYPASS GRAFTING (CABG), on pump, times two, using left internal mammary artery, cryo saphenous vein.;  Surgeon: Ivin Poot, MD;  Location:  [place];  Service: Open Heart Surgery;  Laterality: N/A;  LIMA-LAD CRYOVEIN -OM  . ESOPHAGOGASTRODUODENOSCOPY  10/16/09   normal without barrett's/three superficial gastric ulcers  . EYE SURGERY     cataracts  . HEMORRHOID SURGERY N/A 06/17/2016   Procedure: EXTENSIVE HEMORRHOIDECTOMY;  Surgeon: Aviva Signs, MD;  Location: [facility];  Service: General;  Laterality: N/A;  . INTRAOPERATIVE TRANSESOPHAGEAL ECHOCARDIOGRAM N/A 11/08/2013   Procedure: INTRAOPERATIVE TRANSESOPHAGEAL ECHOCARDIOGRAM;  Surgeon: Ivin Poot, MD;  Location: [place];  Service: Open Heart Surgery;  Laterality: N/A;  . LEFT HEART CATHETERIZATION WITH CORONARY ANGIOGRAM N/A 07/19/2011   Procedure: LEFT HEART CATHETERIZATION WITH CORONARY ANGIOGRAM;  Surgeon: Laverda Page, MD;  Location: [facility] CATH LAB;  Service: Cardiovascular;  Laterality: N/A;  . LEFT HEART CATHETERIZATION WITH CORONARY ANGIOGRAM N/A 12/21/2011   Procedure: LEFT HEART CATHETERIZATION WITH CORONARY ANGIOGRAM;  Surgeon: Laverda Page, MD;  Location: [facility] CATH LAB;  Service: Cardiovascular;  Laterality: N/A;  . LEFT HEART CATHETERIZATION WITH CORONARY ANGIOGRAM N/A 02/20/2012   Procedure: LEFT HEART CATHETERIZATION WITH CORONARY ANGIOGRAM;  Surgeon: Laverda Page, MD;  Location: [facility] CATH LAB;  Service: Cardiovascular;  Laterality: N/A;  . LEFT HEART CATHETERIZATION WITH CORONARY ANGIOGRAM N/A 09/03/2012   Procedure: LEFT HEART CATHETERIZATION WITH CORONARY ANGIOGRAM;  Surgeon: Laverda Page, MD;  Location: [facility] CATH LAB;  Service: Cardiovascular;  Laterality: N/A;  . LEFT HEART CATHETERIZATION WITH CORONARY ANGIOGRAM N/A 12/04/2012   Procedure: LEFT HEART CATHETERIZATION WITH CORONARY ANGIOGRAM;  Surgeon: Laverda Page, MD;  Location: [facility] CATH LAB;  Service: Cardiovascular;  Laterality: N/A;  . LEFT HEART CATHETERIZATION WITH CORONARY ANGIOGRAM N/A 01/22/2013   Procedure: LEFT HEART CATHETERIZATION WITH CORONARY ANGIOGRAM;  Surgeon: Laverda Page, MD;   Location: [facility] CATH LAB;  Service: Cardiovascular;  Laterality: N/A;  . LEFT HEART CATHETERIZATION WITH CORONARY ANGIOGRAM N/A 06/26/2013   Procedure: LEFT HEART CATHETERIZATION WITH CORONARY ANGIOGRAM;  Surgeon: Laverda Page, MD;  Location: [facility] CATH LAB;  Service: Cardiovascular;  Laterality: N/A;  . LEFT HEART CATHETERIZATION WITH CORONARY ANGIOGRAM N/A 11/05/2013   Procedure: LEFT HEART CATHETERIZATION WITH CORONARY ANGIOGRAM;  Surgeon: Laverda Page, MD;  Location: [facility] CATH LAB;  Service: Cardiovascular;  Laterality: N/A;  . MITRAL VALVE REPLACEMENT N/A 11/08/2013   Procedure: MITRAL VALVE (MV) REPLACEMENT;  Surgeon: Ivin Poot, MD;  Location: [place];  Service: Open Heart Surgery;  Laterality: N/A;  #25 MAGNA MITRAL EASE  . PERCUTANEOUS CORONARY STENT INTERVENTION (PCI-S)  09/03/2012   Procedure: PERCUTANEOUS CORONARY STENT INTERVENTION (PCI-S);  Surgeon: Laverda Page, MD;  Location: [facility] CATH LAB;  Service: Cardiovascular;;  . TOE AMPUTATION Left 2013   left second toe amputation  . TONSILLECTOMY     Current Outpatient Prescriptions on File Prior to Visit  Medication Sig Dispense Refill  . ACCU-CHEK FASTCLIX LANCETS MISC USE  TO TEST BLOOD SUGAR FOUR TIMES DAILY FASTING, BEFORE MEALS  AND AT BEDTIME DUE TO FLUCTUATING BLOOD SUGARS 408 each 3  . ACCU-CHEK SMARTVIEW  test strip USE  TO TEST BLOOD SUGAR FOUR TIMES DAILY  FASTING, BEFORE MEALS, AND AT BEDTIME DUE TO FLUCTUATING BLOOD SUGARS 400 each 3  . ascorbic acid (VITAMIN C) 500 MG tablet Take 1 tablet (500 mg total) by mouth 2 (two) times daily. 60 tablet 11  . B-D ULTRAFINE III SHORT PEN 31G X 8 MM MISC USE  TO INJECT  INSULIN  5  TIMES  DAILY 450 each 3  . cetirizine (ZYRTEC) 10 MG tablet Take 1 tablet (10 mg total) by mouth at bedtime. 30 tablet 11  . doxazosin (CARDURA) 4 MG tablet Take 1 tablet (4 mg total) by mouth daily. 90 tablet 3  . ferrous sulfate 325 (65 FE) MG tablet Take 1 tablet (325 mg total) by mouth 2 (two) times  daily with a meal. 60 tablet 11  . fluticasone (FLONASE) 50 MCG/ACT nasal spray USE 1 SPRAY IN EACH NOSTRIL EVERY DAY (Patient taking differently: USE 1 SPRAY IN EACH NOSTRIL EVERY DAY AS NEEDED FOR ALLERGIES) 32 g 3  . insulin aspart (NOVOLOG FLEXPEN) 100 UNIT/ML FlexPen 70-120=0    251-300=5 121-150=1  301-350=7 151-200=2  351-400=9 201-250=3  400 or >, call MD  units of insulin (Patient taking differently: Inject 1-5 Units into the skin 3 (three) times daily as needed. 70-120=0    251-300=5 121-150=1  301-350=7 151-200=2  351-400=9 201-250=3  400 or >, call MD  units of insulin) 45 mL 3  . LANTUS SOLOSTAR 100 UNIT/ML Solostar Pen INJECT 20 UNITS SUBCUTANEOUSLY TWICE DAILY (Patient taking differently: INJECT 20 UNITS SUBCUTANEOUSLY IN THE MORNING) 45 mL 3  . losartan (COZAAR) 25 MG tablet Take 25 mg by mouth daily.    . nitroGLYCERIN (NITROSTAT) 0.4 MG SL tablet Place 1 tablet (0.4 mg total) under the tongue every 5 (five) minutes as needed for chest pain. 25 tablet 2  . pantoprazole (PROTONIX) 40 MG tablet TAKE 1 TABLET EVERY DAY 90 tablet 1  . simvastatin (ZOCOR) 20 MG tablet TAKE 1 TABLET EVERY DAY 90 tablet 1  . torsemide (DEMADEX) 20 MG tablet TAKE 1 TABLET TWICE DAILY (Patient taking differently: TAKE 1 TABLET  DAILY EACH MORNING) 180 tablet 3  . traMADol (ULTRAM) 50 MG tablet Take 1 tablet (50 mg total) by mouth every 6 (six) hours as needed for moderate pain. 60 tablet 0  . traZODone (DESYREL) 50 MG tablet TAKE 1 TABLET AT BEDTIME 90 tablet 1   No current facility-administered medications on file prior to visit.    Allergies  Allergen Reactions  . Aspirin Other (See Comments)    High doses caused stomach bleeds   Social History   Social History  . Marital status: Widowed    Spouse name: N/A  . Number of children: N/A  . Years of education: N/A   Occupational History  . Not on file.   Social History Main Topics  . Smoking status: Never Smoker  . Smokeless tobacco: Never Used    . Alcohol use No  . Drug use: No  . Sexual activity: No   Other Topics Concern  . Not on file   Social History Narrative  . No narrative on file   No family history on file.    Review of Systems  All other systems reviewed and are negative.      Objective:   Physical Exam  Constitutional: She is oriented to person, place, and time. She appears well-developed. No distress.  HENT:  Head: Normocephalic and atraumatic.  Right  Ear: External ear normal.  Left Ear: External ear normal.  Nose: Nose normal.  Mouth/Throat: Oropharynx is clear and moist.  Eyes: Pupils are equal, round, and reactive to light. Conjunctivae and EOM are normal. Right eye exhibits no discharge. Left eye exhibits no discharge. No scleral icterus.  Neck: Normal range of motion. Neck supple. No JVD present. No tracheal deviation present. No thyromegaly present.  Cardiovascular: Normal rate, regular rhythm, normal heart sounds and intact distal pulses.  Exam reveals no gallop and no friction rub.   No murmur heard. Pulmonary/Chest: Effort normal and breath sounds normal. No stridor. No respiratory distress. She has no wheezes. She has no rales. She exhibits no tenderness.  Abdominal: Soft. Bowel sounds are normal. She exhibits no distension and no mass. There is no tenderness. There is no rebound and no guarding.  Genitourinary:     Musculoskeletal: She exhibits edema.  Lymphadenopathy:    She has no cervical adenopathy.  Neurological: She is alert and oriented to person, place, and time. No cranial nerve deficit.  Skin: Skin is warm. No rash noted. She is not diaphoretic. There is erythema. No pallor.  Psychiatric: She has a normal mood and affect. Her behavior is normal. Judgment and thought content normal.  Vitals reviewed.       Assessment & Plan:   Low hemoglobin - Plan: Ambulatory referral to Gastroenterology, Iron, Vitamin B12, CBC with Differential/Platelet, BASIC METABOLIC PANEL WITH GFR,  Anemia panel, Ambulatory referral to Nephrology  Cellulitis of buttock - Plan: sulfamethoxazole-trimethoprim (BACTRIM) 400-80 MG tablet  Fever, unspecified fever cause - Plan: Ambulatory referral to Nephrology  CKD (chronic kidney disease), stage IV ([place]) Fortunately, the transfusion allows [name] some time to workup her anemia as an outpatient. I will consult gastroenterology to determine the source of blood loss. Patient continues to have occasional stools with blood and therefore, I feel that she would benefit from a colonoscopy to determine if she is bleeding in her gastrointestinal tract. I will also check iron studies, total iron-binding capacity, percent saturation, as well as B12 studies to determine if she has vitamin deficiencies contributing to her anemia. I also believe her chronic kidney disease is likely contributing due to a deficiency and erythropoietin. Therefore I will also consult nephrology to determine if she is a candidate for erythropoietin injections.  I believe her altered mental status 2 days ago was due to her fever. Workup in the emergency room isolated no source of infection. However the patient does appear ill to me today and I believe that antibiotic she received any emergency room have simply suppress the infection but not eliminated. The only source I can see on exam is the erythema diagrammed. It is possible that she has cellulitis secondary to her incontinence of stool and prolonged sitting. Therefore I'll start the patient on Bactrim single strength tablets to cover MRSA, gram-negative bacteria, and gram-positive bacteria. I will reassess the patient on Friday or sooner if worse.

## 2016-10-12 ENCOUNTER — Encounter: Payer: Self-pay | Admitting: Gastroenterology

## 2016-10-12 ENCOUNTER — Ambulatory Visit (INDEPENDENT_AMBULATORY_CARE_PROVIDER_SITE_OTHER): Payer: Medicare HMO | Admitting: Gastroenterology

## 2016-10-12 ENCOUNTER — Other Ambulatory Visit: Payer: Self-pay

## 2016-10-12 VITALS — BP 114/62 | HR 84 | Temp 97.0°F | Ht 67.0 in | Wt 162.8 lb

## 2016-10-12 DIAGNOSIS — D649 Anemia, unspecified: Secondary | ICD-10-CM | POA: Diagnosis not present

## 2016-10-12 LAB — CBC WITH DIFFERENTIAL/PLATELET
BASOS ABS: 0 {cells}/uL (ref 0–200)
Basophils Relative: 0 %
EOS ABS: 0 {cells}/uL — AB (ref 15–500)
Eosinophils Relative: 0 %
HCT: 28 % — ABNORMAL LOW (ref 35.0–45.0)
HEMOGLOBIN: 9.4 g/dL — AB (ref 12.0–15.0)
LYMPHS ABS: 1890 {cells}/uL (ref 850–3900)
Lymphocytes Relative: 45 %
MCH: 28.7 pg (ref 27.0–33.0)
MCHC: 33.6 g/dL (ref 32.0–36.0)
MCV: 85.4 fL (ref 80.0–100.0)
MONO ABS: 336 {cells}/uL (ref 200–950)
MPV: 9.6 fL (ref 7.5–12.5)
Monocytes Relative: 8 %
NEUTROS PCT: 47 %
Neutro Abs: 1974 cells/uL (ref 1500–7800)
PLATELETS: 186 10*3/uL (ref 140–400)
RBC: 3.28 MIL/uL — ABNORMAL LOW (ref 3.80–5.10)
RDW: 15.3 % — ABNORMAL HIGH (ref 11.0–15.0)
WBC: 4.2 10*3/uL (ref 3.8–10.8)

## 2016-10-12 LAB — BASIC METABOLIC PANEL WITH GFR
BUN: 59 mg/dL — ABNORMAL HIGH (ref 7–25)
CALCIUM: 8.2 mg/dL — AB (ref 8.6–10.4)
CHLORIDE: 101 mmol/L (ref 98–110)
CO2: 15 mmol/L — AB (ref 20–32)
CREATININE: 2.18 mg/dL — AB (ref 0.60–0.88)
GFR, EST NON AFRICAN AMERICAN: 20 mL/min — AB (ref >=60)
GFR, Est African American: 24 mL/min — ABNORMAL LOW (ref >=60)
Glucose, Bld: 93 mg/dL (ref 70–99)
Potassium: 4.4 mmol/L (ref 3.5–5.3)
Sodium: 128 mmol/L — ABNORMAL LOW (ref 135–146)

## 2016-10-12 LAB — ANEMIA PANEL
%SAT: 18 % (ref 11–50)
ABS Retic: 39360 cells/uL (ref 20000–80000)
FOLATE: 22 ng/mL (ref >5.4)
Ferritin: 143 ng/mL (ref 20–288)
Iron: 49 ug/dL (ref 45–160)
RBC.: 3.28 MIL/uL — AB (ref 3.80–5.10)
RETIC CT PCT: 1.2 %
TIBC: 267 ug/dL (ref 250–450)
UIBC: 218 ug/dL
VITAMIN B 12: 385 pg/mL (ref 200–1100)

## 2016-10-12 NOTE — Progress Notes (Signed)
Referring Provider: Susy Frizzle, MD Primary Care Physician:  Susy Frizzle, MD Primary GI: Dr. Gala Romney   Chief Complaint  Patient presents with  . Anemia    HPI:   Claudia [name] is a 83 y.o. female presenting today with a history of rectal bleeding, normocytic anemia, recently undergoing colonoscopy with Grade 4 hemorrhoids as likely culprit for bleeding. She underwent hemorrhoidectomy by Dr. Arnoldo Morale in April 2018. Hgb had been stable in the 9 range since colonoscopy but recently dropped to 7.6 requiring transfusion. Now presenting again today at Dr. Samella Parr request for further evaluation. Chronic kidney disease noted.   Stool is dark but not black. Just very dark. Sticky. On iron BID. Has to wipe a lot. Recently diagnosed with cellulitis of buttock. No bright red blood in stool. States last saw bright red blood in stool about a month ago that was "not as bad as it has been". Every once in awhile will have a sharp intermittent pain to left of umbilicus that feels "sore". Sometimes will pass stool and not know she has done it. Sometimes passes a regular stool that is painful. No nausea or vomiting. Appetite waxes and wanes but no weight loss. Has felt bad for last few weeks. Received blood transfusion last Tuesday. Only blood transfusion that she has had. Negative H.pylorin 2011. History of PUD in 2011.   Past Medical History:  Diagnosis Date  . Anemia   . Arthritis   . Cancer (HCC)    hx of skin cancer  . CHF (congestive heart failure) (HCC)    diastolic   . CKD (chronic kidney disease) stage 4, GFR 15-29 ml/min (HCC)   . Coronary artery disease   . Diabetes mellitus    insulin dependent  . GERD (gastroesophageal reflux disease)   . Headache(784.0)    rare migraines  . Hypercholesterolemia   . Hypertension   . Myocardial infarction ([place]) 2013  . Osteoporosis   . Proliferative retinopathy due to DM ([place])   . PUD (peptic ulcer disease)   . S/P colonoscopy Sept 2011     left-sided diverticula, tubular adenoma  . S/P endoscopy Aug 2011   3 superficial gastric ulcers, NSAID-induced  . Shortness of breath   . SIADH (syndrome of inappropriate ADH production) ([place])     Past Surgical History:  Procedure Laterality Date  . APPENDECTOMY    . CARDIAC CATHETERIZATION  01/22/2013  . cardiac stents  07/19/2011  . COLONOSCOPY  12/04/09   normal rectum/left-sided diverticula  . COLONOSCOPY N/A 05/26/2016   Dr. Gala Romney: grade 4 hemorrhoids likely source of hematochezia, diverticulosis in sigmoid and descending colon, element of rectal prolapse not excluded.   . CORONARY ANGIOPLASTY  07/2011  . CORONARY ARTERY BYPASS GRAFT N/A 11/08/2013   Procedure: CORONARY ARTERY BYPASS GRAFTING (CABG), on pump, times two, using left internal mammary artery, cryo saphenous vein.;  Surgeon: Ivin Poot, MD;  Location: [place];  Service: Open Heart Surgery;  Laterality: N/A;  LIMA-LAD CRYOVEIN -OM  . ESOPHAGOGASTRODUODENOSCOPY  10/16/09   normal without barrett's/three superficial gastric ulcers  . EYE SURGERY     cataracts  . HEMORRHOID SURGERY N/A 06/17/2016   Procedure: EXTENSIVE HEMORRHOIDECTOMY;  Surgeon: Aviva Signs, MD;  Location: [facility];  Service: General;  Laterality: N/A;  . INTRAOPERATIVE TRANSESOPHAGEAL ECHOCARDIOGRAM N/A 11/08/2013   Procedure: INTRAOPERATIVE TRANSESOPHAGEAL ECHOCARDIOGRAM;  Surgeon: Ivin Poot, MD;  Location: [place];  Service: Open Heart Surgery;  Laterality: N/A;  . LEFT HEART  CATHETERIZATION WITH CORONARY ANGIOGRAM N/A 07/19/2011   Procedure: LEFT HEART CATHETERIZATION WITH CORONARY ANGIOGRAM;  Surgeon: Laverda Page, MD;  Location: [facility] CATH LAB;  Service: Cardiovascular;  Laterality: N/A;  . LEFT HEART CATHETERIZATION WITH CORONARY ANGIOGRAM N/A 12/21/2011   Procedure: LEFT HEART CATHETERIZATION WITH CORONARY ANGIOGRAM;  Surgeon: Laverda Page, MD;  Location: [facility] CATH LAB;  Service: Cardiovascular;  Laterality: N/A;  . LEFT HEART CATHETERIZATION  WITH CORONARY ANGIOGRAM N/A 02/20/2012   Procedure: LEFT HEART CATHETERIZATION WITH CORONARY ANGIOGRAM;  Surgeon: Laverda Page, MD;  Location: [facility] CATH LAB;  Service: Cardiovascular;  Laterality: N/A;  . LEFT HEART CATHETERIZATION WITH CORONARY ANGIOGRAM N/A 09/03/2012   Procedure: LEFT HEART CATHETERIZATION WITH CORONARY ANGIOGRAM;  Surgeon: Laverda Page, MD;  Location: [facility] CATH LAB;  Service: Cardiovascular;  Laterality: N/A;  . LEFT HEART CATHETERIZATION WITH CORONARY ANGIOGRAM N/A 12/04/2012   Procedure: LEFT HEART CATHETERIZATION WITH CORONARY ANGIOGRAM;  Surgeon: Laverda Page, MD;  Location: [facility] CATH LAB;  Service: Cardiovascular;  Laterality: N/A;  . LEFT HEART CATHETERIZATION WITH CORONARY ANGIOGRAM N/A 01/22/2013   Procedure: LEFT HEART CATHETERIZATION WITH CORONARY ANGIOGRAM;  Surgeon: Laverda Page, MD;  Location: [facility] CATH LAB;  Service: Cardiovascular;  Laterality: N/A;  . LEFT HEART CATHETERIZATION WITH CORONARY ANGIOGRAM N/A 06/26/2013   Procedure: LEFT HEART CATHETERIZATION WITH CORONARY ANGIOGRAM;  Surgeon: Laverda Page, MD;  Location: [facility] CATH LAB;  Service: Cardiovascular;  Laterality: N/A;  . LEFT HEART CATHETERIZATION WITH CORONARY ANGIOGRAM N/A 11/05/2013   Procedure: LEFT HEART CATHETERIZATION WITH CORONARY ANGIOGRAM;  Surgeon: Laverda Page, MD;  Location: [facility] CATH LAB;  Service: Cardiovascular;  Laterality: N/A;  . MITRAL VALVE REPLACEMENT N/A 11/08/2013   Procedure: MITRAL VALVE (MV) REPLACEMENT;  Surgeon: Ivin Poot, MD;  Location: [place];  Service: Open Heart Surgery;  Laterality: N/A;  #25 MAGNA MITRAL EASE  . PERCUTANEOUS CORONARY STENT INTERVENTION (PCI-S)  09/03/2012   Procedure: PERCUTANEOUS CORONARY STENT INTERVENTION (PCI-S);  Surgeon: Laverda Page, MD;  Location: [facility] CATH LAB;  Service: Cardiovascular;;  . TOE AMPUTATION Left 2013   left second toe amputation  . TONSILLECTOMY      Current Outpatient Prescriptions  Medication Sig Dispense  Refill  . ACCU-CHEK FASTCLIX LANCETS MISC USE  TO TEST BLOOD SUGAR FOUR TIMES DAILY FASTING, BEFORE MEALS  AND AT BEDTIME DUE TO FLUCTUATING BLOOD SUGARS 408 each 3  . ACCU-CHEK SMARTVIEW test strip USE  TO TEST BLOOD SUGAR FOUR TIMES DAILY  FASTING, BEFORE MEALS, AND AT BEDTIME DUE TO FLUCTUATING BLOOD SUGARS 400 each 3  . ascorbic acid (VITAMIN C) 500 MG tablet Take 1 tablet (500 mg total) by mouth 2 (two) times daily. 60 tablet 11  . B-D ULTRAFINE III SHORT PEN 31G X 8 MM MISC USE  TO INJECT  INSULIN  5  TIMES  DAILY 450 each 3  . cetirizine (ZYRTEC) 10 MG tablet Take 1 tablet (10 mg total) by mouth at bedtime. 30 tablet 11  . doxazosin (CARDURA) 4 MG tablet Take 1 tablet (4 mg total) by mouth daily. 90 tablet 3  . ferrous sulfate 325 (65 FE) MG tablet Take 1 tablet (325 mg total) by mouth 2 (two) times daily with a meal. 60 tablet 11  . fluticasone (FLONASE) 50 MCG/ACT nasal spray USE 1 SPRAY IN EACH NOSTRIL EVERY DAY (Patient taking differently: USE 1 SPRAY IN EACH NOSTRIL EVERY DAY AS NEEDED FOR ALLERGIES) 32 g 3  . insulin aspart (NOVOLOG FLEXPEN) 100  UNIT/ML FlexPen 70-120=0    251-300=5 121-150=1  301-350=7 151-200=2  351-400=9 201-250=3  400 or >, call MD  units of insulin (Patient taking differently: Inject 1-5 Units into the skin 3 (three) times daily as needed. 70-120=0    251-300=5 121-150=1  301-350=7 151-200=2  351-400=9 201-250=3  400 or >, call MD  units of insulin) 45 mL 3  . LANTUS SOLOSTAR 100 UNIT/ML Solostar Pen INJECT 20 UNITS SUBCUTANEOUSLY TWICE DAILY (Patient taking differently: INJECT 20 UNITS SUBCUTANEOUSLY IN THE MORNING) 45 mL 3  . losartan (COZAAR) 25 MG tablet Take 25 mg by mouth daily.    . nitroGLYCERIN (NITROSTAT) 0.4 MG SL tablet Place 1 tablet (0.4 mg total) under the tongue every 5 (five) minutes as needed for chest pain. 25 tablet 2  . pantoprazole (PROTONIX) 40 MG tablet TAKE 1 TABLET EVERY DAY 90 tablet 1  . simvastatin (ZOCOR) 20 MG tablet TAKE 1 TABLET EVERY  DAY (Patient taking differently: TAKE 1 TABLET EVERY DAY IN THE EVENING) 90 tablet 1  . sulfamethoxazole-trimethoprim (BACTRIM) 400-80 MG tablet Take 1 tablet by mouth 2 (two) times daily. 14 tablet 0  . torsemide (DEMADEX) 20 MG tablet TAKE 1 TABLET TWICE DAILY (Patient taking differently: TAKE 1 TABLET  DAILY EACH MORNING) 180 tablet 3  . traMADol (ULTRAM) 50 MG tablet Take 1 tablet (50 mg total) by mouth every 6 (six) hours as needed for moderate pain. 60 tablet 0  . traZODone (DESYREL) 50 MG tablet TAKE 1 TABLET AT BEDTIME (Patient taking differently: TAKE 1 TABLET AT BEDTIME AS NEEDED FOR SLEEP.) 90 tablet 1   No current facility-administered medications for this visit.     Allergies as of 10/12/2016 - Review Complete 10/12/2016  Allergen Reaction Noted  . Aspirin Other (See Comments) 10/06/2010      Social History   Social History  . Marital status: Widowed    Spouse name: N/A  . Number of children: N/A  . Years of education: N/A   Social History Main Topics  . Smoking status: Never Smoker  . Smokeless tobacco: Never Used  . Alcohol use No  . Drug use: No  . Sexual activity: No   Other Topics Concern  . Not on file   Social History Narrative  . No narrative on file    Review of Systems: As in HPI  Physical Exam: BP 114/62   Pulse 84   Temp (!) 97 F (36.1 C) (Oral)   Ht 5\' 7"  (1.702 m)   Wt 162 lb 12.8 oz (73.8 kg)   BMI 25.50 kg/m  General:   Alert and oriented. No distress noted. Pleasant and cooperative.  Head:  Normocephalic and atraumatic. Eyes:  Conjuctiva clear without scleral icterus. Mouth:  Oral mucosa pink and moist.  Abdomen:  +BS, soft, non-tender and non-distended. No rebound or guarding. No HSM or masses noted. Soft, dark brown stool. No melena. Heme negative.  Msk:  Symmetrical without gross deformities. Normal posture. Extremities:  Without edema. Neurologic:  Alert and  oriented x4 Psych:  Alert and cooperative. Normal mood and  affect.  Lab Results  Component Value Date   WBC 4.2 10/11/2016   HGB 9.4 (L) 10/11/2016   HCT 28.0 (L) 10/11/2016   MCV 85.4 10/11/2016   PLT 186 10/11/2016   Lab Results  Component Value Date   ALT 14 10/09/2016   AST 24 10/09/2016   ALKPHOS 115 10/09/2016   BILITOT 1.2 10/09/2016   Lab Results  Component Value  Date   CREATININE 2.18 (H) 10/11/2016   Lab Results  Component Value Date   FERRITIN 143 10/11/2016

## 2016-10-12 NOTE — Patient Instructions (Addendum)
We are scheduling you for an upper endoscopy with Dr. Gala Romney. Do not take Lantus the morning of the procedure.   Further recommendations to follow!

## 2016-10-14 ENCOUNTER — Encounter: Payer: Self-pay | Admitting: Family Medicine

## 2016-10-14 ENCOUNTER — Ambulatory Visit (INDEPENDENT_AMBULATORY_CARE_PROVIDER_SITE_OTHER): Payer: Medicare HMO | Admitting: Family Medicine

## 2016-10-14 VITALS — BP 110/50 | HR 66 | Temp 98.8°F | Resp 16 | Ht 67.0 in | Wt 162.0 lb

## 2016-10-14 DIAGNOSIS — E871 Hypo-osmolality and hyponatremia: Secondary | ICD-10-CM

## 2016-10-14 DIAGNOSIS — D649 Anemia, unspecified: Secondary | ICD-10-CM

## 2016-10-14 DIAGNOSIS — L03317 Cellulitis of buttock: Secondary | ICD-10-CM

## 2016-10-14 LAB — CULTURE, BLOOD (ROUTINE X 2)
Culture: NO GROWTH
Culture: NO GROWTH
SPECIAL REQUESTS: ADEQUATE
SPECIAL REQUESTS: ADEQUATE

## 2016-10-14 NOTE — Progress Notes (Signed)
Subjective:    Patient ID: Claudia [name], female    DOB: Aug 10, 1931, 83 y.o.   MRN: [id number]  HPI 04/15/16 Patient feels slightly better since discontinuing Cardura. However she continues to remain bradycardic with heart rate between 48 and 58. The home health care nurse reports a blood pressure of 100/60 with heart rate of 48 this morning. Patient continues to have orthostatic dizziness and feels unsteady on her feet. Furthermore she reports several episodes of bright red blood per rectum at times it can be heavy. She has a history of hemorrhoids however the amount of blood that she describes sounds like it may be more from diverticulosis  10/11/16 Since I last saw the patient, she underwent hemorrhoid surgery in April. She continues to have occasional bloody stools. Recently, I was paged over the weekend with a critically low hemoglobin of 7.6. Review of her chart shows there had been a gradual steady decline in her hemoglobin over the summer. We arranged for the patient received 2 units of pack red blood through short stay. Her hemoglobin has since come up to 9.8 post transfusion.  However we have not isolated the cause of her anemia. She continues to endorse occasional bloody bowel movements. She also has chronic kidney disease stage IV which could contribute to her anemia. She has not yet had iron studies and B12 studies since I was called that evening. Unfortunately, the patient had to go to the hospital/emergency room  over the weekend with altered mental status. Apparently, her son found her in the kitchen urinating. She was confused and thought she was in the bathroom. Patient was febrile. She was rushed to the hospital. In the hospital, chest x-ray revealed no source of infection. Urinalysis was negative. CBC and CMP reveal no obvious abnormalities other than her anemia which was improved. She was given 1 g of Rocephin and then was discharged home after receiving IV fluids. Yesterday she was afebrile. However today she seems lethargic and more confused. Her son and the patient deny any obvious source of infection. She denies any  cough or shortness of breath. She denies any abdominal pain nausea vomiting or diarrhea. She does report pain with defecation. Therefore I examined the patient's rectal area. The skin around her rectum is bright red erythematous and warm. Is also tender to palpation and appears to be cellulitis. There is no perirectal abscess ear there is no fluctuance or drainage. Please see the diagram.  At that time, my plan was: Fortunately, the transfusion allows [name] some time to workup her anemia as an outpatient. I will consult gastroenterology to determine the source of blood loss. Patient continues to have occasional stools with blood and therefore, I feel that she would benefit from a colonoscopy to determine if she is bleeding in her gastrointestinal tract. I will also check iron studies, total iron-binding capacity, percent saturation, as well as B12 studies to determine if she has vitamin deficiencies contributing to her anemia. I also believe her chronic kidney disease is likely contributing due to a deficiency and erythropoietin. Therefore I will also consult nephrology to determine if she is a candidate for erythropoietin injections.  I believe her altered mental status 2 days ago was due to her fever. Workup in the emergency room isolated no source of infection. However the patient does appear ill to me  today and I believe that antibiotic she received any emergency room have simply suppress the infection but not eliminated. The only source I can see on exam is the erythema diagrammed. It is possible that she has cellulitis secondary to her incontinence of stool and prolonged sitting. Therefore I'll start the patient on Bactrim single strength tablets to cover MRSA, gram-negative bacteria, and gram-positive bacteria. I will reassess the patient on Friday or sooner if worse.  10/14/16 Erythema has faded around the rectum.  She also states that the pain around her rectum is much improved. She's had no further fever. She saw GI earlier this week and they're scheduling an EGD for next week. Lab work was significant for a sodium level of 128 when I saw her earlier this week. Her altered mental status has not returned since starting antibiotics. However I will recheck her sodium level to ensure that hyponatremia is not playing a role Past Medical History:  Diagnosis Date  . Anemia   . Arthritis   . Cancer (HCC)    hx of skin cancer  . CHF (congestive heart failure) (HCC)    diastolic   . CKD (chronic kidney disease) stage 4, GFR 15-29 ml/min (HCC)   . Coronary artery disease   . Diabetes mellitus    insulin dependent  . GERD (gastroesophageal reflux disease)   . Headache(784.0)    rare migraines  . Hypercholesterolemia   . Hypertension   . Myocardial infarction ([place]) 2013  . Osteoporosis   . Proliferative retinopathy due to DM ([place])   . PUD (peptic ulcer disease)   . S/P colonoscopy Sept 2011   left-sided diverticula, tubular adenoma  . S/P endoscopy Aug 2011   3 superficial gastric ulcers, NSAID-induced  . Shortness of breath   . SIADH (syndrome of inappropriate ADH production) ([place])    Past Surgical History:  Procedure Laterality Date  . APPENDECTOMY    . CARDIAC CATHETERIZATION  01/22/2013  . cardiac stents  07/19/2011  . COLONOSCOPY  12/04/09   normal rectum/left-sided diverticula    . COLONOSCOPY N/A 05/26/2016   Dr. Gala Romney: grade 4 hemorrhoids likely source of hematochezia, diverticulosis in sigmoid and descending colon, element of rectal prolapse not excluded.   Marland Kitchen  CORONARY ANGIOPLASTY  07/2011  . CORONARY ARTERY BYPASS GRAFT N/A 11/08/2013   Procedure: CORONARY ARTERY BYPASS GRAFTING (CABG), on pump, times two, using left internal mammary artery, cryo saphenous vein.;  Surgeon: Ivin Poot, MD;  Location: [address];  Service: Open Heart Surgery;  Laterality: N/A;  LIMA-LAD CRYOVEIN -OM  . ESOPHAGOGASTRODUODENOSCOPY  10/16/09   normal without barrett's/three superficial gastric ulcers  . EYE SURGERY     cataracts  . HEMORRHOID SURGERY N/A 06/17/2016   Procedure: EXTENSIVE HEMORRHOIDECTOMY;  Surgeon: Aviva Signs, MD;  Location: [facility];  Service: General;  Laterality: N/A;  . INTRAOPERATIVE TRANSESOPHAGEAL ECHOCARDIOGRAM N/A 11/08/2013   Procedure: INTRAOPERATIVE TRANSESOPHAGEAL ECHOCARDIOGRAM;  Surgeon: Ivin Poot, MD;  Location: [place];  Service: Open Heart Surgery;  Laterality: N/A;  . LEFT HEART CATHETERIZATION WITH CORONARY ANGIOGRAM N/A 07/19/2011   Procedure: LEFT HEART CATHETERIZATION WITH CORONARY ANGIOGRAM;  Surgeon: Laverda Page, MD;  Location: [facility] CATH LAB;  Service: Cardiovascular;  Laterality: N/A;  . LEFT HEART CATHETERIZATION WITH CORONARY ANGIOGRAM N/A 12/21/2011   Procedure: LEFT HEART CATHETERIZATION WITH CORONARY ANGIOGRAM;  Surgeon: Laverda Page, MD;  Location: [facility] CATH LAB;  Service: Cardiovascular;  Laterality: N/A;  . LEFT HEART CATHETERIZATION WITH CORONARY ANGIOGRAM N/A 02/20/2012   Procedure: LEFT HEART CATHETERIZATION WITH CORONARY ANGIOGRAM;  Surgeon: Laverda Page, MD;  Location: [facility] CATH LAB;  Service: Cardiovascular;  Laterality: N/A;  . LEFT HEART CATHETERIZATION WITH CORONARY ANGIOGRAM N/A 09/03/2012   Procedure: LEFT HEART CATHETERIZATION WITH CORONARY ANGIOGRAM;  Surgeon: Laverda Page, MD;  Location: [facility] CATH LAB;  Service:  Cardiovascular;  Laterality: N/A;  . LEFT HEART CATHETERIZATION WITH CORONARY ANGIOGRAM N/A 12/04/2012   Procedure: LEFT HEART CATHETERIZATION WITH CORONARY ANGIOGRAM;  Surgeon: Laverda Page, MD;  Location: [facility] CATH LAB;  Service: Cardiovascular;  Laterality: N/A;  . LEFT HEART CATHETERIZATION WITH CORONARY ANGIOGRAM N/A 01/22/2013   Procedure: LEFT HEART CATHETERIZATION WITH CORONARY ANGIOGRAM;  Surgeon: Laverda Page, MD;  Location: [facility] CATH LAB;  Service: Cardiovascular;  Laterality: N/A;  . LEFT HEART CATHETERIZATION WITH CORONARY ANGIOGRAM N/A 06/26/2013   Procedure: LEFT HEART CATHETERIZATION WITH CORONARY ANGIOGRAM;  Surgeon: Laverda Page, MD;  Location: [facility] CATH LAB;  Service: Cardiovascular;  Laterality: N/A;  . LEFT HEART CATHETERIZATION WITH CORONARY ANGIOGRAM N/A 11/05/2013   Procedure: LEFT HEART CATHETERIZATION WITH CORONARY ANGIOGRAM;  Surgeon: Laverda Page, MD;  Location[facility] C[facility] LAB;  Service: Cardiovascular;  Laterality: N/A;  . MITRAL VALVE REPLACEMENT N/A 11/08/2013   Procedure: MITRAL VALVE (MV) REPLACEMENT;  Surgeon: Ivin Poot, MD;  Location: [place];  Service: Open Heart Surgery;  Laterality: N/A;  #25 MAGNA MITRAL EASE  . PERCUTANEOUS CORONARY STENT INTERVENTION (PCI-S)  09/03/2012   Procedure: PERCUTANEOUS CORONARY STENT INTERVENTION (PCI-S);  Surgeon: Laverda Page, MD;  Location: [facility] CATH LAB;  Service: Cardiovascular;;  . TOE AMPUTATION Left 2013   left second toe amputation  . TONSILLECTOMY     Current Outpatient Prescriptions on File Prior to Visit  Medication Sig Dispense Refill  . ACCU-CHEK FASTCLIX LANCETS MISC USE  TO TEST BLOOD SUGAR FOUR TIMES DAILY FASTING, BEFORE MEALS  AND AT BEDTIME DUE TO FLUCTUATING BLOOD SUGARS 408 each 3  . ACCU-CHEK SMARTVIEW test strip USE  TO TEST BLOOD SUGAR FOUR TIMES DAILY  FASTING, BEFORE MEALS, AND AT BEDTIME DUE TO FLUCTUATING BLOOD SUGARS 400 each 3  . ascorbic acid (VITAMIN C) 500 MG tablet Take 1 tablet (500  mg total) by mouth  2 (two) times daily. 60 tablet 11  . B-D ULTRAFINE III SHORT PEN 31G X 8 MM MISC USE  TO INJECT  INSULIN  5  TIMES  DAILY 450 each 3  . cetirizine (ZYRTEC) 10 MG tablet Take 1 tablet (10 mg total) by mouth at bedtime. 30 tablet 11  . doxazosin (CARDURA) 4 MG tablet Take 1 tablet (4 mg total) by mouth daily. 90 tablet 3  . ferrous sulfate 325 (65 FE) MG tablet Take 1 tablet (325 mg total) by mouth 2 (two) times daily with a meal. 60 tablet 11  . fluticasone (FLONASE) 50 MCG/ACT nasal spray USE 1 SPRAY IN EACH NOSTRIL EVERY DAY (Patient taking differently: USE 1 SPRAY IN EACH NOSTRIL EVERY DAY AS NEEDED FOR ALLERGIES) 32 g 3  . insulin aspart (NOVOLOG FLEXPEN) 100 UNIT/ML FlexPen 70-120=0    251-300=5 121-150=1  301-350=7 151-200=2  351-400=9 201-250=3  400 or >, call MD  units of insulin (Patient taking differently: Inject 1-5 Units into the skin 3 (three) times daily as needed. 70-120=0    251-300=5 121-150=1  301-350=7 151-200=2  351-400=9 201-250=3  400 or >, call MD  units of insulin) 45 mL 3  . LANTUS SOLOSTAR 100 UNIT/ML Solostar Pen INJECT 20 UNITS SUBCUTANEOUSLY TWICE DAILY (Patient taking differently: INJECT 20 UNITS SUBCUTANEOUSLY IN THE MORNING) 45 mL 3  . losartan (COZAAR) 25 MG tablet Take 25 mg by mouth daily.    . nitroGLYCERIN (NITROSTAT) 0.4 MG SL tablet Place 1 tablet (0.4 mg total) under the tongue every 5 (five) minutes as needed for chest pain. 25 tablet 2  . pantoprazole (PROTONIX) 40 MG tablet TAKE 1 TABLET EVERY DAY 90 tablet 1  . simvastatin (ZOCOR) 20 MG tablet TAKE 1 TABLET EVERY DAY (Patient taking differently: TAKE 1 TABLET EVERY DAY IN THE EVENING) 90 tablet 1  . sulfamethoxazole-trimethoprim (BACTRIM) 400-80 MG tablet Take 1 tablet by mouth 2 (two) times daily. 14 tablet 0  . torsemide (DEMADEX) 20 MG tablet TAKE 1 TABLET TWICE DAILY (Patient taking differently: TAKE 1 TABLET  DAILY EACH MORNING) 180 tablet 3  . traMADol (ULTRAM) 50 MG tablet Take 1  tablet (50 mg total) by mouth every 6 (six) hours as needed for moderate pain. 60 tablet 0  . traZODone (DESYREL) 50 MG tablet TAKE 1 TABLET AT BEDTIME (Patient taking differently: TAKE 1 TABLET AT BEDTIME AS NEEDED FOR SLEEP.) 90 tablet 1   No current facility-administered medications on file prior to visit.    Allergies  Allergen Reactions  . Aspirin Other (See Comments)    High doses caused stomach bleeds   Social History   Social History  . Marital status: Widowed    Spouse name: N/A  . Number of children: N/A  . Years of education: N/A   Occupational History  . Not on file.   Social History Main Topics  . Smoking status: Never Smoker  . Smokeless tobacco: Never Used  . Alcohol use No  . Drug use: No  . Sexual activity: No   Other Topics Concern  . Not on file   Social History Narrative  . No narrative on file   No family history on file.    Review of Systems  All other systems reviewed and are negative.      Objective:   Physical Exam  Constitutional: She is oriented to person, place, and time. She appears well-developed. No distress.  HENT:  Head: Normocephalic and atraumatic.  Right Ear: External ear  normal.  Left Ear: External ear normal.  Nose: Nose normal.  Mouth/Throat: Oropharynx is clear and moist.  Eyes: Pupils are equal, round, and reactive to light. Conjunctivae and EOM are normal. Right eye exhibits no discharge. Left eye exhibits no discharge. No scleral icterus.  Neck: Normal range of motion. Neck supple. No JVD present. No tracheal deviation present. No thyromegaly present.  Cardiovascular: Normal rate, regular rhythm, normal heart sounds and intact distal pulses.  Exam reveals no gallop and no friction rub.   No murmur heard. Pulmonary/Chest: Effort normal and breath sounds normal. No stridor. No respiratory distress. She has no wheezes. She has no rales. She exhibits no tenderness.  Abdominal: Soft. Bowel sounds are normal. She exhibits  no distension and no mass. There is no tenderness. There is no rebound and no guarding.  Genitourinary:     Musculoskeletal: She exhibits edema.  Lymphadenopathy:    She has no cervical adenopathy.  Neurological: She is alert and oriented to person, place, and time. No cranial nerve deficit.  Skin: Skin is warm. No rash noted. She is not diaphoretic. There is erythema. No pallor.  Psychiatric: She has a normal mood and affect. Her behavior is normal. Judgment and thought content normal.  Vitals reviewed.       Assessment & Plan:   Hyponatremia - Plan: BASIC METABOLIC PANEL WITH GFR  Low hemoglobin  Cellulitis of buttock  Erythema is fading in the area drawn on the diagram. The pain is improved. Therefore cellulitis seems to be resolving. Complete antibiotics. I will recheck a sodium level to evaluate her hyponatremia.  If hyponatremia persists, patient will likely need to increase her diuretic particularly given the edema seen in her legs. Await the results of the renal referral as well as a GI workup regarding her anemia. Recheck CBC in one month

## 2016-10-14 NOTE — Assessment & Plan Note (Addendum)
83 year old female with acute on chronic anemia, receiving blood transfusion recently. Heme negative per my exam today. Small amount of hematochezia noted a month ago, with history significant for hemorrhoid surgery in April 2018. Colonoscopy on file. History of PUD in 2011 (last EGD). Anemia appears to be ofchronic disease in setting of worsening renal function; however, to conclude GI evaluation, will complete EGD in near future.   Proceed with upper endoscopy in the near future with Dr. Gala Romney. The risks, benefits, and alternatives have been discussed in detail with patient. They have stated understanding and desire to proceed.

## 2016-10-15 DIAGNOSIS — G4734 Idiopathic sleep related nonobstructive alveolar hypoventilation: Secondary | ICD-10-CM | POA: Diagnosis not present

## 2016-10-15 LAB — BASIC METABOLIC PANEL WITH GFR
BUN: 57 mg/dL — AB (ref 7–25)
CHLORIDE: 102 mmol/L (ref 98–110)
CO2: 16 mmol/L — ABNORMAL LOW (ref 20–32)
CREATININE: 2.46 mg/dL — AB (ref 0.60–0.88)
Calcium: 8 mg/dL — ABNORMAL LOW (ref 8.6–10.4)
GFR, Est African American: 20 mL/min — ABNORMAL LOW (ref >=60)
GFR, Est Non African American: 18 mL/min — ABNORMAL LOW (ref >=60)
GLUCOSE: 116 mg/dL — AB (ref 70–99)
POTASSIUM: 4.4 mmol/L (ref 3.5–5.3)
Sodium: 131 mmol/L — ABNORMAL LOW (ref 135–146)

## 2016-10-17 ENCOUNTER — Encounter (HOSPITAL_COMMUNITY)
Admission: RE | Disposition: A | Discharge status: Home / Self Care | Payer: Self-pay | Source: Ambulatory Visit | Attending: Internal Medicine

## 2016-10-17 ENCOUNTER — Telehealth: Payer: Self-pay | Admitting: Internal Medicine

## 2016-10-17 ENCOUNTER — Other Ambulatory Visit: Payer: Self-pay | Admitting: Family Medicine

## 2016-10-17 ENCOUNTER — Ambulatory Visit (HOSPITAL_COMMUNITY)
Admission: RE | Admit: 2016-10-17 | Discharge: 2016-10-17 | Disposition: A | Discharge status: Home / Self Care | Payer: Medicare HMO | Source: Ambulatory Visit | Attending: Internal Medicine | Admitting: Internal Medicine

## 2016-10-17 ENCOUNTER — Encounter (HOSPITAL_COMMUNITY): Payer: Self-pay

## 2016-10-17 DIAGNOSIS — I130 Hypertensive heart and chronic kidney disease with heart failure and stage 1 through stage 4 chronic kidney disease, or unspecified chronic kidney disease: Secondary | ICD-10-CM | POA: Insufficient documentation

## 2016-10-17 DIAGNOSIS — M1990 Unspecified osteoarthritis, unspecified site: Secondary | ICD-10-CM | POA: Diagnosis not present

## 2016-10-17 DIAGNOSIS — N184 Chronic kidney disease, stage 4 (severe): Secondary | ICD-10-CM | POA: Diagnosis not present

## 2016-10-17 DIAGNOSIS — Z8719 Personal history of other diseases of the digestive system: Secondary | ICD-10-CM | POA: Diagnosis not present

## 2016-10-17 DIAGNOSIS — Z79899 Other long term (current) drug therapy: Secondary | ICD-10-CM | POA: Insufficient documentation

## 2016-10-17 DIAGNOSIS — Z952 Presence of prosthetic heart valve: Secondary | ICD-10-CM | POA: Diagnosis not present

## 2016-10-17 DIAGNOSIS — Z794 Long term (current) use of insulin: Secondary | ICD-10-CM | POA: Diagnosis not present

## 2016-10-17 DIAGNOSIS — I5032 Chronic diastolic (congestive) heart failure: Secondary | ICD-10-CM | POA: Diagnosis not present

## 2016-10-17 DIAGNOSIS — K219 Gastro-esophageal reflux disease without esophagitis: Secondary | ICD-10-CM | POA: Diagnosis not present

## 2016-10-17 DIAGNOSIS — I252 Old myocardial infarction: Secondary | ICD-10-CM | POA: Insufficient documentation

## 2016-10-17 DIAGNOSIS — E1122 Type 2 diabetes mellitus with diabetic chronic kidney disease: Secondary | ICD-10-CM | POA: Insufficient documentation

## 2016-10-17 DIAGNOSIS — M810 Age-related osteoporosis without current pathological fracture: Secondary | ICD-10-CM | POA: Insufficient documentation

## 2016-10-17 DIAGNOSIS — Z85828 Personal history of other malignant neoplasm of skin: Secondary | ICD-10-CM | POA: Insufficient documentation

## 2016-10-17 DIAGNOSIS — D62 Acute posthemorrhagic anemia: Secondary | ICD-10-CM | POA: Insufficient documentation

## 2016-10-17 DIAGNOSIS — I129 Hypertensive chronic kidney disease with stage 1 through stage 4 chronic kidney disease, or unspecified chronic kidney disease: Secondary | ICD-10-CM

## 2016-10-17 DIAGNOSIS — E113599 Type 2 diabetes mellitus with proliferative diabetic retinopathy without macular edema, unspecified eye: Secondary | ICD-10-CM | POA: Diagnosis not present

## 2016-10-17 DIAGNOSIS — Z951 Presence of aortocoronary bypass graft: Secondary | ICD-10-CM | POA: Diagnosis not present

## 2016-10-17 DIAGNOSIS — D649 Anemia, unspecified: Secondary | ICD-10-CM

## 2016-10-17 DIAGNOSIS — E7800 Pure hypercholesterolemia, unspecified: Secondary | ICD-10-CM | POA: Diagnosis not present

## 2016-10-17 DIAGNOSIS — Z8711 Personal history of peptic ulcer disease: Secondary | ICD-10-CM | POA: Diagnosis not present

## 2016-10-17 DIAGNOSIS — Z7952 Long term (current) use of systemic steroids: Secondary | ICD-10-CM | POA: Insufficient documentation

## 2016-10-17 DIAGNOSIS — I2510 Atherosclerotic heart disease of native coronary artery without angina pectoris: Secondary | ICD-10-CM | POA: Diagnosis not present

## 2016-10-17 HISTORY — PX: ESOPHAGOGASTRODUODENOSCOPY: SHX5428

## 2016-10-17 LAB — GLUCOSE, CAPILLARY: GLUCOSE-CAPILLARY: 187 mg/dL — AB (ref 65–99)

## 2016-10-17 SURGERY — EGD (ESOPHAGOGASTRODUODENOSCOPY)
Anesthesia: Moderate Sedation

## 2016-10-17 MED ORDER — SIMETHICONE 40 MG/0.6ML PO SUSP
ORAL | Status: DC | PRN
  Administered 2016-10-17: 13:00:00

## 2016-10-17 MED ORDER — MEPERIDINE HCL 100 MG/ML IJ SOLN
INTRAMUSCULAR | Status: AC
  Filled 2016-10-17: qty 2

## 2016-10-17 MED ORDER — ONDANSETRON HCL 4 MG/2ML IJ SOLN
INTRAMUSCULAR | Status: DC | PRN
  Administered 2016-10-17: 4 mg via INTRAVENOUS

## 2016-10-17 MED ORDER — LIDOCAINE VISCOUS 2 % MT SOLN
OROMUCOSAL | Status: DC | PRN
  Administered 2016-10-17: 4 mL via OROMUCOSAL

## 2016-10-17 MED ORDER — MEPERIDINE HCL 100 MG/ML IJ SOLN
INTRAMUSCULAR | Status: DC | PRN
  Administered 2016-10-17: 25 mg via INTRAVENOUS

## 2016-10-17 MED ORDER — SODIUM CHLORIDE 0.9 % IV SOLN
INTRAVENOUS | Status: DC
  Administered 2016-10-17: 13:00:00 via INTRAVENOUS

## 2016-10-17 MED ORDER — LIDOCAINE VISCOUS 2 % MT SOLN
OROMUCOSAL | Status: AC
  Filled 2016-10-17: qty 15

## 2016-10-17 MED ORDER — MIDAZOLAM HCL 5 MG/5ML IJ SOLN
INTRAMUSCULAR | Status: AC
  Filled 2016-10-17: qty 10

## 2016-10-17 MED ORDER — ONDANSETRON HCL 4 MG/2ML IJ SOLN
INTRAMUSCULAR | Status: AC
  Filled 2016-10-17: qty 2

## 2016-10-17 MED ORDER — MIDAZOLAM HCL 5 MG/5ML IJ SOLN
INTRAMUSCULAR | Status: DC | PRN
  Administered 2016-10-17: 2 mg via INTRAVENOUS

## 2016-10-17 NOTE — Discharge Instructions (Signed)
EGD Discharge instructions Please read the instructions outlined below and refer to this sheet in the next few weeks. These discharge instructions provide you with general information on caring for yourself after you leave the hospital. Your doctor may also give you specific instructions. While your treatment has been planned according to the most current medical practices available, unavoidable complications occasionally occur. If you have any problems or questions after discharge, please call your doctor. ACTIVITY  You may resume your regular activity but move at a slower pace for the next 24 hours.   Take frequent rest periods for the next 24 hours.   Walking will help expel (get rid of) the air and reduce the bloated feeling in your abdomen.   No driving for 24 hours (because of the anesthesia (medicine) used during the test).   You may shower.   Do not sign any important legal documents or operate any machinery for 24 hours (because of the anesthesia used during the test).  NUTRITION  Drink plenty of fluids.   You may resume your normal diet.   Begin with a light meal and progress to your normal diet.   Avoid alcoholic beverages for 24 hours or as instructed by your caregiver.  MEDICATIONS  You may resume your normal medications unless your caregiver tells you otherwise.  WHAT YOU CAN EXPECT TODAY  You may experience abdominal discomfort such as a feeling of fullness or gas pains.  FOLLOW-UP  Your doctor will discuss the results of your test with you.  SEEK IMMEDIATE MEDICAL ATTENTION IF ANY OF THE FOLLOWING OCCUR:  Excessive nausea (feeling sick to your stomach) and/or vomiting.   Severe abdominal pain and distention (swelling).   Trouble swallowing.   Temperature over 101 F (37.8 C).   Rectal bleeding or vomiting of blood.    Your EGD was normal today.  We will arrange follow-up appointment with [name] in 1 month along with a CBC  You may need evaluation of  your small intestine to complete the GI evaluation.

## 2016-10-17 NOTE — H&P (View-Only) (Signed)
Referring Provider: Susy Frizzle, MD Primary Care Physician:  Susy Frizzle, MD Primary GI: Dr. Gala Romney   Chief Complaint  Patient presents with  . Anemia    HPI:   Claudia [name] is a 83 y.o. female presenting today with a history of rectal bleeding, normocytic anemia, recently undergoing colonoscopy with Grade 4 hemorrhoids as likely culprit for bleeding. She underwent hemorrhoidectomy by Dr. Arnoldo Morale in April 2018. Hgb had been stable in the 9 range since colonoscopy but recently dropped to 7.6 requiring transfusion. Now presenting again today at Dr. Samella Parr request for further evaluation. Chronic kidney disease noted.   Stool is dark but not black. Just very dark. Sticky. On iron BID. Has to wipe a lot. Recently diagnosed with cellulitis of buttock. No bright red blood in stool. States last saw bright red blood in stool about a month ago that was "not as bad as it has been". Every once in awhile will have a sharp intermittent pain to left of umbilicus that feels "sore". Sometimes will pass stool and not know she has done it. Sometimes passes a regular stool that is painful. No nausea or vomiting. Appetite waxes and wanes but no weight loss. Has felt bad for last few weeks. Received blood transfusion last Tuesday. Only blood transfusion that she has had. Negative H.pylorin 2011. History of PUD in 2011.   Past Medical History:  Diagnosis Date  . Anemia   . Arthritis   . Cancer (HCC)    hx of skin cancer  . CHF (congestive heart failure) (HCC)    diastolic   . CKD (chronic kidney disease) stage 4, GFR 15-29 ml/min (HCC)   . Coronary artery disease   . Diabetes mellitus    insulin dependent  . GERD (gastroesophageal reflux disease)   . Headache(784.0)    rare migraines  . Hypercholesterolemia   . Hypertension   . Myocardial infarction ([place]) 2013  . Osteoporosis   . Proliferative retinopathy due to DM ([place])   . PUD (peptic ulcer disease)   . S/P colonoscopy Sept 2011     left-sided diverticula, tubular adenoma  . S/P endoscopy Aug 2011   3 superficial gastric ulcers, NSAID-induced  . Shortness of breath   . SIADH (syndrome of inappropriate ADH production) ([place])     Past Surgical History:  Procedure Laterality Date  . APPENDECTOMY    . CARDIAC CATHETERIZATION  01/22/2013  . cardiac stents  07/19/2011  . COLONOSCOPY  12/04/09   normal rectum/left-sided diverticula  . COLONOSCOPY N/A 05/26/2016   Dr. Gala Romney: grade 4 hemorrhoids likely source of hematochezia, diverticulosis in sigmoid and descending colon, element of rectal prolapse not excluded.   . CORONARY ANGIOPLASTY  07/2011  . CORONARY ARTERY BYPASS GRAFT N/A 11/08/2013   Procedure: CORONARY ARTERY BYPASS GRAFTING (CABG), on pump, times two, using left internal mammary artery, cryo saphenous vein.;  Surgeon: Ivin Poot, MD;  Location: [facility];  Service: Open Heart Surgery;  Laterality: N/A;  LIMA-LAD CRYOVEIN -OM  . ESOPHAGOGASTRODUODENOSCOPY  10/16/09   normal without barrett's/three superficial gastric ulcers  . EYE SURGERY     cataracts  . HEMORRHOID SURGERY N/A 06/17/2016   Procedure: EXTENSIVE HEMORRHOIDECTOMY;  Surgeon: Aviva Signs, MD;  Location: [facility];  Service: General;  Laterality: N/A;  . INTRAOPERATIVE TRANSESOPHAGEAL ECHOCARDIOGRAM N/A 11/08/2013   Procedure: INTRAOPERATIVE TRANSESOPHAGEAL ECHOCARDIOGRAM;  Surgeon: Ivin Poot, MD;  Location: [facility];  Service: Open Heart Surgery;  Laterality: N/A;  . LEFT HEART  CATHETERIZATION WITH CORONARY ANGIOGRAM N/A 07/19/2011   Procedure: LEFT HEART CATHETERIZATION WITH CORONARY ANGIOGRAM;  Surgeon: Laverda Page, MD;  Location: [facility] CATH LAB;  Service: Cardiovascular;  Laterality: N/A;  . LEFT HEART CATHETERIZATION WITH CORONARY ANGIOGRAM N/A 12/21/2011   Procedure: LEFT HEART CATHETERIZATION WITH CORONARY ANGIOGRAM;  Surgeon: Laverda Page, MD;  Location: [facility] CATH LAB;  Service: Cardiovascular;  Laterality: N/A;  . LEFT HEART CATHETERIZATION  WITH CORONARY ANGIOGRAM N/A 02/20/2012   Procedure: LEFT HEART CATHETERIZATION WITH CORONARY ANGIOGRAM;  Surgeon: Laverda Page, MD;  Location: [facility] CATH LAB;  Service: Cardiovascular;  Laterality: N/A;  . LEFT HEART CATHETERIZATION WITH CORONARY ANGIOGRAM N/A 09/03/2012   Procedure: LEFT HEART CATHETERIZATION WITH CORONARY ANGIOGRAM;  Surgeon: Laverda Page, MD;  Location: [facility] CATH LAB;  Service: Cardiovascular;  Laterality: N/A;  . LEFT HEART CATHETERIZATION WITH CORONARY ANGIOGRAM N/A 12/04/2012   Procedure: LEFT HEART CATHETERIZATION WITH CORONARY ANGIOGRAM;  Surgeon: Laverda Page, MD;  Location: [facility] CATH LAB;  Service: Cardiovascular;  Laterality: N/A;  . LEFT HEART CATHETERIZATION WITH CORONARY ANGIOGRAM N/A 01/22/2013   Procedure: LEFT HEART CATHETERIZATION WITH CORONARY ANGIOGRAM;  Surgeon: Laverda Page, MD;  Location: [facility] C[facility] LAB;  Service: Cardiovascular;  Laterality: N/A;  . LEFT HEART CATHETERIZATION WITH CORONARY ANGIOGRAM N/A 06/26/2013   Procedure: LEFT HEART CATHETERIZATION WITH CORONARY ANGIOGRAM;  Surgeon: Laverda Page, MD;  Location: [facility] CATH LAB;  Service: Cardiovascular;  Laterality: N/A;  . LEFT HEART CATHETERIZATION WITH CORONARY ANGIOGRAM N/A 11/05/2013   Procedure: LEFT HEART CATHETERIZATION WITH CORONARY ANGIOGRAM;  Surgeon: Laverda Page, MD;  Location: [facility] CATH LAB;  Service: Cardiovascular;  Laterality: N/A;  . MITRAL VALVE REPLACEMENT N/A 11/08/2013   Procedure: MITRAL VALVE (MV) REPLACEMENT;  Surgeon: Ivin Poot, MD;  Location: [place];  Service: Open Heart Surgery;  Laterality: N/A;  #25 MAGNA MITRAL EASE  . PERCUTANEOUS CORONARY STENT INTERVENTION (PCI-S)  09/03/2012   Procedure: PERCUTANEOUS CORONARY STENT INTERVENTION (PCI-S);  Surgeon: Laverda Page, MD;  Location: [facility] CATH LAB;  Service: Cardiovascular;;  . TOE AMPUTATION Left 2013   left second toe amputation  . TONSILLECTOMY      Current Outpatient Prescriptions  Medication Sig Dispense  Refill  . ACCU-CHEK FASTCLIX LANCETS MISC USE  TO TEST BLOOD SUGAR FOUR TIMES DAILY FASTING, BEFORE MEALS  AND AT BEDTIME DUE TO FLUCTUATING BLOOD SUGARS 408 each 3  . ACCU-CHEK SMARTVIEW test strip USE  TO TEST BLOOD SUGAR FOUR TIMES DAILY  FASTING, BEFORE MEALS, AND AT BEDTIME DUE TO FLUCTUATING BLOOD SUGARS 400 each 3  . ascorbic acid (VITAMIN C) 500 MG tablet Take 1 tablet (500 mg total) by mouth 2 (two) times daily. 60 tablet 11  . B-D ULTRAFINE III SHORT PEN 31G X 8 MM MISC USE  TO INJECT  INSULIN  5  TIMES  DAILY 450 each 3  . cetirizine (ZYRTEC) 10 MG tablet Take 1 tablet (10 mg total) by mouth at bedtime. 30 tablet 11  . doxazosin (CARDURA) 4 MG tablet Take 1 tablet (4 mg total) by mouth daily. 90 tablet 3  . ferrous sulfate 325 (65 FE) MG tablet Take 1 tablet (325 mg total) by mouth 2 (two) times daily with a meal. 60 tablet 11  . fluticasone (FLONASE) 50 MCG/ACT nasal spray USE 1 SPRAY IN EACH NOSTRIL EVERY DAY (Patient taking differently: USE 1 SPRAY IN EACH NOSTRIL EVERY DAY AS NEEDED FOR ALLERGIES) 32 g 3  . insulin aspart (NOVOLOG FLEXPEN) 100  UNIT/ML FlexPen 70-120=0    251-300=5 121-150=1  301-350=7 151-200=2  351-400=9 201-250=3  400 or >, call MD  units of insulin (Patient taking differently: Inject 1-5 Units into the skin 3 (three) times daily as needed. 70-120=0    251-300=5 121-150=1  301-350=7 151-200=2  351-400=9 201-250=3  400 or >, call MD  units of insulin) 45 mL 3  . LANTUS SOLOSTAR 100 UNIT/ML Solostar Pen INJECT 20 UNITS SUBCUTANEOUSLY TWICE DAILY (Patient taking differently: INJECT 20 UNITS SUBCUTANEOUSLY IN THE MORNING) 45 mL 3  . losartan (COZAAR) 25 MG tablet Take 25 mg by mouth daily.    . nitroGLYCERIN (NITROSTAT) 0.4 MG SL tablet Place 1 tablet (0.4 mg total) under the tongue every 5 (five) minutes as needed for chest pain. 25 tablet 2  . pantoprazole (PROTONIX) 40 MG tablet TAKE 1 TABLET EVERY DAY 90 tablet 1  . simvastatin (ZOCOR) 20 MG tablet TAKE 1 TABLET EVERY  DAY (Patient taking differently: TAKE 1 TABLET EVERY DAY IN THE EVENING) 90 tablet 1  . sulfamethoxazole-trimethoprim (BACTRIM) 400-80 MG tablet Take 1 tablet by mouth 2 (two) times daily. 14 tablet 0  . torsemide (DEMADEX) 20 MG tablet TAKE 1 TABLET TWICE DAILY (Patient taking differently: TAKE 1 TABLET  DAILY EACH MORNING) 180 tablet 3  . traMADol (ULTRAM) 50 MG tablet Take 1 tablet (50 mg total) by mouth every 6 (six) hours as needed for moderate pain. 60 tablet 0  . traZODone (DESYREL) 50 MG tablet TAKE 1 TABLET AT BEDTIME (Patient taking differently: TAKE 1 TABLET AT BEDTIME AS NEEDED FOR SLEEP.) 90 tablet 1   No current facility-administered medications for this visit.     Allergies as of 10/12/2016 - Review Complete 10/12/2016  Allergen Reaction Noted  . Aspirin Other (See Comments) 10/06/2010      Social History   Social History  . Marital status: Widowed    Spouse name: N/A  . Number of children: N/A  . Years of education: N/A   Social History Main Topics  . Smoking status: Never Smoker  . Smokeless tobacco: Never Used  . Alcohol use No  . Drug use: No  . Sexual activity: No   Other Topics Concern  . Not on file   Social History Narrative  . No narrative on file    Review of Systems: As in HPI  Physical Exam: BP 114/62   Pulse 84   Temp (!) 97 F (36.1 C) (Oral)   Ht 5\' 7"  (1.702 m)   Wt 162 lb 12.8 oz (73.8 kg)   BMI 25.50 kg/m  General:   Alert and oriented. No distress noted. Pleasant and cooperative.  Head:  Normocephalic and atraumatic. Eyes:  Conjuctiva clear without scleral icterus. Mouth:  Oral mucosa pink and moist.  Abdomen:  +BS, soft, non-tender and non-distended. No rebound or guarding. No HSM or masses noted. Soft, dark brown stool. No melena. Heme negative.  Msk:  Symmetrical without gross deformities. Normal posture. Extremities:  Without edema. Neurologic:  Alert and  oriented x4 Psych:  Alert and cooperative. Normal mood and  affect.  Lab Results  Component Value Date   WBC 4.2 10/11/2016   HGB 9.4 (L) 10/11/2016   HCT 28.0 (L) 10/11/2016   MCV 85.4 10/11/2016   PLT 186 10/11/2016   Lab Results  Component Value Date   ALT 14 10/09/2016   AST 24 10/09/2016   ALKPHOS 115 10/09/2016   BILITOT 1.2 10/09/2016   Lab Results  Component Value  Date   CREATININE 2.18 (H) 10/11/2016   Lab Results  Component Value Date   FERRITIN 143 10/11/2016

## 2016-10-17 NOTE — Interval H&P Note (Signed)
History and Physical Interval Note:  10/17/2016 1:11 PM  Claudia [name]  has presented today for surgery, with the diagnosis of anemia  The various methods of treatment have been discussed with the patient and family. After consideration of risks, benefits and other options for treatment, the patient has consented to  Procedure(s) with comments: ESOPHAGOGASTRODUODENOSCOPY (EGD) (N/A) - 1:30pm as a surgical intervention .  The patient's history has been reviewed, patient examined, no change in status, stable for surgery.  I have reviewed the patient's chart and labs.  Questions were answered to the patient's satisfaction.     [place]  No change. No dysphagia. Diagnostic EGD per plan.  The risks, benefits, limitations, alternatives and imponderables have been reviewed with the patient. Potential for esophageal dilation, biopsy, etc. have also been reviewed.  Questions have been answered. All parties agreeable.

## 2016-10-17 NOTE — Op Note (Signed)
[facility] Patient Name: Claudia [name] Procedure Date: 10/17/2016 1:01 PM MRN: [id number] Date of Birth: [birth year] Attending MD: Norvel Richards , MD CSN: [id number] Age: 83 Admit Type: Outpatient Procedure:                Upper GI endoscopy Indications:              Acute post hemorrhagic anemia Providers:                Norvel Richards, MD, Jeanann Lewandowsky. Sharon Seller, RN,                            Randa Spike, Technician Referring MD:              Medicines:                Midazolam 2 mg IV, Meperidine 25 mg IV Complications:            No immediate complications. Estimated Blood Loss:     Estimated blood loss: none. Procedure:                Pre-Anesthesia Assessment:                           - Prior to the procedure, a History and Physical                            was performed, and patient medications and                            allergies were reviewed. The patient's tolerance of                            previous anesthesia was also reviewed. The risks                            and benefits of the procedure and the sedation                            options and risks were discussed with the patient.                            All questions were answered, and informed consent                            was obtained. Prior Anticoagulants: The patient has                            taken no previous anticoagulant or antiplatelet                            agents. ASA Grade Assessment: III - A patient with                            severe systemic disease. After reviewing the risks  and benefits, the patient was deemed in                            satisfactory condition to undergo the procedure.                           After obtaining informed consent, the endoscope was                            passed under direct vision. Throughout the                            procedure, the patient's blood pressure, pulse, and               oxygen saturations were monitored continuously. The                            [id number] ([id number]) scope was introduced through the                            mouth, and advanced to the second part of duodenum.                            The upper GI endoscopy was accomplished without                            difficulty. The patient tolerated the procedure                            well. The upper GI endoscopy was accomplished                            without difficulty. Scope In: 1:22:00 PM Scope Out: 1:25:53 PM Total Procedure Duration: 0 hours 3 minutes 53 seconds  Findings:      The examined esophagus was normal.      The entire examined stomach was normal.      The duodenal bulb and second portion of the duodenum were normal.       Estimated blood loss: none. Impression:               tip in hemoglobin may be multifactorial in origin.                            In part, could be a result of chronic kidney and                            hemorrhoidal disease (along with recent surgery for                            this problem).                           - Normal esophagus.                           -  Normal stomach.                           - Normal duodenal bulb and second portion of the                            duodenum.                           - No specimens collected. Moderate Sedation:      Moderate (conscious) sedation was administered by the endoscopy nurse       and supervised by the endoscopist. The following parameters were       monitored: oxygen saturation, heart rate, blood pressure, respiratory       rate, EKG, adequacy of pulmonary ventilation, and response to care.       Total physician intraservice time was 9 minutes. Recommendation:           - Written discharge instructions were provided to                            the patient.                           - The signs and symptoms of potential delayed                             complications were discussed with the patient.                           - Patient has a contact number available for                            emergencies.                           - Return to normal activities tomorrow.                           - Resume previous diet.                           - Continue present medications.                           - No repeat upper endoscopy.                           - Return to GI office in 1 month. Check a CBC in 1                            month and see how she is doing clinically. She may                            or may not need a small bowel capsule study to  complete the GI evaluation. Procedure Code(s):        --- Professional ---                           [phone], Esophagogastroduodenoscopy, flexible,                            transoral; diagnostic, including collection of                            specimen(s) by brushing or washing, when performed                            (separate procedure) Diagnosis Code(s):        --- Professional ---                           D62, Acute posthemorrhagic anemia CPT copyright 2016 [employer]. All rights reserved. The codes documented in this report are preliminary and upon coder review may  be revised to meet current compliance requirements. Cristopher Estimable. Blannie Shedlock, MD Norvel Richards, MD 10/17/2016 1:40:41 PM This report has been signed electronically. Number of Addenda: 0

## 2016-10-17 NOTE — Telephone Encounter (Signed)
Lab order on file. 

## 2016-10-17 NOTE — Progress Notes (Signed)
cc'ed to pcp °

## 2016-10-17 NOTE — Telephone Encounter (Signed)
Pam from [facility] called to make pp fu in one month and said patient would also need CBC. OV was made with EG on 10/2 at 0830

## 2016-10-19 ENCOUNTER — Encounter (HOSPITAL_COMMUNITY): Payer: Self-pay | Admitting: Internal Medicine

## 2016-11-02 ENCOUNTER — Other Ambulatory Visit: Payer: Self-pay | Admitting: Family Medicine

## 2016-11-03 ENCOUNTER — Other Ambulatory Visit: Payer: Self-pay | Admitting: Family Medicine

## 2016-11-10 ENCOUNTER — Other Ambulatory Visit: Payer: Self-pay

## 2016-11-10 DIAGNOSIS — D649 Anemia, unspecified: Secondary | ICD-10-CM

## 2016-11-14 ENCOUNTER — Encounter: Payer: Self-pay | Admitting: Family Medicine

## 2016-11-14 ENCOUNTER — Ambulatory Visit (INDEPENDENT_AMBULATORY_CARE_PROVIDER_SITE_OTHER): Payer: Medicare HMO | Admitting: Family Medicine

## 2016-11-14 VITALS — BP 118/58 | HR 78 | Temp 98.3°F | Resp 16 | Ht 67.0 in | Wt 154.0 lb

## 2016-11-14 DIAGNOSIS — Z23 Encounter for immunization: Secondary | ICD-10-CM

## 2016-11-14 DIAGNOSIS — N184 Chronic kidney disease, stage 4 (severe): Secondary | ICD-10-CM | POA: Diagnosis not present

## 2016-11-14 DIAGNOSIS — D631 Anemia in chronic kidney disease: Secondary | ICD-10-CM | POA: Diagnosis not present

## 2016-11-14 LAB — CBC WITH DIFFERENTIAL/PLATELET
BASOS ABS: 28 {cells}/uL (ref 0–200)
Basophils Relative: 0.8 %
EOS ABS: 49 {cells}/uL (ref 15–500)
EOS PCT: 1.4 %
HCT: 26.1 % — ABNORMAL LOW (ref 35.0–45.0)
HEMOGLOBIN: 8.7 g/dL — AB (ref 11.7–15.5)
Lymphs Abs: 1250 cells/uL (ref 850–3900)
MCH: 29.1 pg (ref 27.0–33.0)
MCHC: 33.3 g/dL (ref 32.0–36.0)
MCV: 87.3 fL (ref 80.0–100.0)
MONOS PCT: 8.8 %
MPV: 10.1 fL (ref 7.5–12.5)
Neutro Abs: 1866 cells/uL (ref 1500–7800)
Neutrophils Relative %: 53.3 %
PLATELETS: 171 10*3/uL (ref 140–400)
RBC: 2.99 10*6/uL — ABNORMAL LOW (ref 3.80–5.10)
RDW: 14.8 % (ref 11.0–15.0)
TOTAL LYMPHOCYTE: 35.7 %
WBC mixed population: 308 cells/uL (ref 200–950)
WBC: 3.5 10*3/uL — ABNORMAL LOW (ref 3.8–10.8)

## 2016-11-14 NOTE — Progress Notes (Signed)
Subjective:    Patient ID: Claudia [name], female    DOB: Feb 18, 1932, 83 y.o.   MRN: [id number]  HPI 04/15/16 Patient feels slightly better since discontinuing Cardura. However she continues to remain bradycardic with heart rate between 48 and 58. The home health care nurse reports a blood pressure of 100/60 with heart rate of 48 this morning. Patient continues to have orthostatic dizziness and feels unsteady on her feet. Furthermore she reports several episodes of bright red blood per rectum at times it can be heavy. She has a history of hemorrhoids however the amount of blood that she describes sounds like it may be more from diverticulosis  10/11/16 Since I last saw the patient, she underwent hemorrhoid surgery in April. She continues to have occasional bloody stools. Recently, I was paged over the weekend with a critically low hemoglobin of 7.6. Review of her chart shows there had been a gradual steady decline in her hemoglobin over the summer. We arranged for the patient received 2 units of pack red blood through short stay. Her hemoglobin has since come up to 9.8 post transfusion.  However we have not isolated the cause of her anemia. She continues to endorse occasional bloody bowel movements. She also has chronic kidney disease stage IV which could contribute to her anemia. She has not yet had iron studies and B12 studies since I was called that evening. Unfortunately, the patient had to go to the hospital/emergency room over the weekend with altered mental status. Apparently, her son found her in the kitchen urinating. She was confused and thought she was in the bathroom. Patient was febrile. She was rushed to the hospital. In the hospital, chest x-ray revealed no source of infection. Urinalysis was negative. CBC and CMP reveal no obvious abnormalities other than her anemia which was improved. She was given 1 g of Rocephin and then was discharged home after receiving IV fluids. Yesterday she was  afebrile. However today she seems lethargic and more confused. Her son and the patient deny any obvious source of infection. She denies any cough or shortness of breath. She denies any abdominal pain nausea vomiting or diarrhea. She does report pain with defecation. Therefore I examined the patient's rectal area. The skin around her rectum is bright red erythematous and warm. Is also tender to palpation and appears to be cellulitis. There is no perirectal abscess ear there is no fluctuance or drainage. Please see the diagram.  At that time, my plan was: Fortunately, the transfusion allows [name] some time to workup her anemia as an outpatient. I will consult gastroenterology to determine the source of blood loss. Patient continues to have occasional stools with blood and therefore, I feel that she would benefit from a colonoscopy to determine if she is bleeding in her gastrointestinal tract. I will also check iron studies, total iron-binding capacity, percent saturation, as well as B12 studies to determine if she has vitamin deficiencies contributing to her anemia. I also believe her chronic kidney disease is likely contributing due to a deficiency and erythropoietin. Therefore I will also consult nephrology to determine if she is a candidate for erythropoietin injections.  I believe her altered mental status 2 days ago was due to her fever. Workup in the emergency room isolated no source of infection. However the patient does appear ill to me today and I believe that antibiotic she received any emergency room have simply suppress the infection but not eliminated. The only source I can see  on exam is the erythema diagrammed. It is possible that she has cellulitis secondary to her incontinence of stool and prolonged sitting. Therefore I'll start the patient on Bactrim single strength tablets to cover MRSA, gram-negative bacteria, and gram-positive bacteria. I will reassess the patient on Friday or sooner if  worse.  10/14/16 Erythema has faded around the rectum.  She also states that the pain around her rectum is much improved. She's had no further fever. She saw GI earlier this week and they're scheduling an EGD for next week. Lab work was significant for a sodium level of 128 when I saw her earlier this week. Her altered mental status has not returned since starting antibiotics. However I will recheck her sodium level to ensure that hyponatremia is not playing a role.  AT that time, my plan was: Erythema is fading in the area drawn on the diagram. The pain is improved. Therefore cellulitis seems to be resolving. Complete antibiotics. I will recheck a sodium level to evaluate her hyponatremia.  If hyponatremia persists, patient will likely need to increase her diuretic particularly given the edema seen in her legs. Await the results of the renal referral as well as a GI workup regarding her anemia. Recheck CBC in one month  11/14/16 The patient's endoscopy did not reveal a source of bleeding. EGD was completely normal. Fecal occult blood cards according to the patient were negative at that time. Therefore GI recommended rechecking a CBC in one month. She is here today for that. She feels much better. She has not seen any further bleeding per rectum from her hemorrhoids. She denies any melena or hematochezia. Patient has not yet heard from nephrology regarding possible EPO.  Anemia panel was significant for a borderline low iron as well as a borderline low iron as well as a borderline low percent saturation at 18%. Past Medical History:  Diagnosis Date  . Anemia   . Arthritis   . Cancer (HCC)    hx of skin cancer  . CHF (congestive heart failure) (HCC)    diastolic   . CKD (chronic kidney disease) stage 4, GFR 15-29 ml/min (HCC)   . Coronary artery disease   . Diabetes mellitus    insulin dependent  . GERD (gastroesophageal reflux disease)   . Headache(784.0)    rare migraines  .  Hypercholesterolemia   . Hypertension   . Myocardial infarction ([place]) 2013  . Osteoporosis   . Proliferative retinopathy due to DM ([place])   . PUD (peptic ulcer disease)   . S/P colonoscopy Sept 2011   left-sided diverticula, tubular adenoma  . S/P endoscopy Aug 2011   3 superficial gastric ulcers, NSAID-induced  . Shortness of breath   . SIADH (syndrome of inappropriate ADH production) ([place])    Past Surgical History:  Procedure Laterality Date  . APPENDECTOMY    . CARDIAC CATHETERIZATION  01/22/2013  . cardiac stents  07/19/2011  . COLONOSCOPY  12/04/09   normal rectum/left-sided diverticula  . COLONOSCOPY N/A 05/26/2016   Dr. Gala Romney: grade 4 hemorrhoids likely source of hematochezia, diverticulosis in sigmoid and descending colon, element of rectal prolapse not excluded.   . CORONARY ANGIOPLASTY  07/2011  . CORONARY ARTERY BYPASS GRAFT N/A 11/08/2013   Procedure: CORONARY ARTERY BYPASS GRAFTING (CABG), on pump, times two, using left internal mammary artery, cryo saphenous vein.;  Surgeon: Ivin Poot, MD;  Location: [place];  Service: Open Heart Surgery;  Laterality: N/A;  LIMA-LAD CRYOVEIN -OM  . ESOPHAGOGASTRODUODENOSCOPY  10/16/09  normal without barrett's/three superficial gastric ulcers  . ESOPHAGOGASTRODUODENOSCOPY N/A 10/17/2016   Procedure: ESOPHAGOGASTRODUODENOSCOPY (EGD);  Surgeon: Daneil Dolin, MD;  [facility];  Service: Endoscopy;  Laterality: N/A;  1:30pm  . EYE SURGERY     cataracts  . HEMORRHOID SURGERY N/A 06/17/2016   Procedure: EXTENSIVE HEMORRHOIDECTOMY;  Surgeon: Aviva Signs, MD;  Location: [facility];  Service: General;  Laterality: N/A;  . INTRAOPERATIVE TRANSESOPHAGEAL ECHOCARDIOGRAM N/A 11/08/2013   Procedure: INTRAOPERATIVE TRANSESOPHAGEAL ECHOCARDIOGRAM;  Surgeon: Ivin Poot, MD;  Location: [place];  Service: Open Heart Surgery;  Laterality: N/A;  . LEFT HEART CATHETERIZATION WITH CORONARY ANGIOGRAM N/A 07/19/2011   Procedure: LEFT HEART  CATHETERIZATION WITH CORONARY ANGIOGRAM;  Surgeon: Laverda Page, MD;  Location: [facility] CATH LAB;  Service: Cardiovascular;  Laterality: N/A;  . LEFT HEART CATHETERIZATION WITH CORONARY ANGIOGRAM N/A 12/21/2011   Procedure: LEFT HEART CATHETERIZATION WITH CORONARY ANGIOGRAM;  Surgeon: Laverda Page, MD;  Location: [facility] CATH LAB;  Service: Cardiovascular;  Laterality: N/A;  . LEFT HEART CATHETERIZATION WITH CORONARY ANGIOGRAM N/A 02/20/2012   Procedure: LEFT HEART CATHETERIZATION WITH CORONARY ANGIOGRAM;  Surgeon: Laverda Page, MD;  Location: [facility] CATH LAB;  Service: Cardiovascular;  Laterality: N/A;  . LEFT HEART CATHETERIZATION WITH CORONARY ANGIOGRAM N/A 09/03/2012   Procedure: LEFT HEART CATHETERIZATION WITH CORONARY ANGIOGRAM;  Surgeon: Laverda Page, MD;  Location: [facility] CATH LAB;  Service: Cardiovascular;  Laterality: N/A;  . LEFT HEART CATHETERIZATION WITH CORONARY ANGIOGRAM N/A 12/04/2012   Procedure: LEFT HEART CATHETERIZATION WITH CORONARY ANGIOGRAM;  Surgeon: Laverda Page, MD;  Location: [facility] CATH LAB;  Service: Cardiovascular;  Laterality: N/A;  . LEFT HEART CATHETERIZATION WITH CORONARY ANGIOGRAM N/A 01/22/2013   Procedure: LEFT HEART CATHETERIZATION WITH CORONARY ANGIOGRAM;  Surgeon: Laverda Page, MD;  Location: [facility] CATH LAB;  Service: Cardiovascular;  Laterality: N/A;  . LEFT HEART CATHETERIZATION WITH CORONARY ANGIOGRAM N/A 06/26/2013   Procedure: LEFT HEART CATHETERIZATION WITH CORONARY ANGIOGRAM;  Surgeon: Laverda Page, MD;  Location: [facility] CATH LAB;  Service: Cardiovascular;  Laterality: N/A;  . LEFT HEART CATHETERIZATION WITH CORONARY ANGIOGRAM N/A 11/05/2013   Procedure: LEFT HEART CATHETERIZATION WITH CORONARY ANGIOGRAM;  Surgeon: Laverda Page, MD;  Location: [facility] CATH LAB;  Service: Cardiovascular;  Laterality: N/A;  . MITRAL VALVE REPLACEMENT N/A 11/08/2013   Procedure: MITRAL VALVE (MV) REPLACEMENT;  Surgeon: Ivin Poot, MD;  Location: [place];  Service: Open  Heart Surgery;  Laterality: N/A;  #25 MAGNA MITRAL EASE  . PERCUTANEOUS CORONARY STENT INTERVENTION (PCI-S)  09/03/2012   Procedure: PERCUTANEOUS CORONARY STENT INTERVENTION (PCI-S);  Surgeon: Laverda Page, MD;  Location: [facility] CATH LAB;  Service: Cardiovascular;;  . TOE AMPUTATION Left 2013   left second toe amputation  . TONSILLECTOMY     Current Outpatient Prescriptions on File Prior to Visit  Medication Sig Dispense Refill  . ACCU-CHEK FASTCLIX LANCETS MISC USE  TO TEST BLOOD SUGAR FOUR TIMES DAILY FASTING, BEFORE MEALS  AND AT BEDTIME DUE TO FLUCTUATING BLOOD SUGARS 408 each 3  . ACCU-CHEK SMARTVIEW test strip USE  TO TEST BLOOD SUGAR FOUR TIMES DAILY  FASTING, BEFORE MEALS, AND AT BEDTIME DUE TO FLUCTUATING BLOOD SUGARS 400 each 3  . ascorbic acid (VITAMIN C) 500 MG tablet Take 1 tablet (500 mg total) by mouth 2 (two) times daily. 60 tablet 11  . B-D ULTRAFINE III SHORT PEN 31G X 8 MM MISC USE  TO INJECT  INSULIN  5  TIMES  DAILY 450  each 3  . cetirizine (ZYRTEC) 10 MG tablet Take 1 tablet (10 mg total) by mouth at bedtime. 30 tablet 11  . doxazosin (CARDURA) 4 MG tablet TAKE 1 TABLET EVERY DAY (DISCONTINUE DOXAZOSIN 2MG ) 90 tablet 3  . ferrous sulfate 325 (65 FE) MG tablet Take 1 tablet (325 mg total) by mouth 2 (two) times daily with a meal. 60 tablet 11  . fluticasone (FLONASE) 50 MCG/ACT nasal spray USE 1 SPRAY IN EACH NOSTRIL EVERY DAY (Patient taking differently: USE 1 SPRAY IN EACH NOSTRIL EVERY DAY AS NEEDED FOR ALLERGIES) 32 g 3  . insulin aspart (NOVOLOG FLEXPEN) 100 UNIT/ML FlexPen 70-120=0    251-300=5 121-150=1  301-350=7 151-200=2  351-400=9 201-250=3  400 or >, call MD  units of insulin (Patient taking differently: Inject 1-5 Units into the skin 3 (three) times daily as needed. 70-120=0    251-300=5 121-150=1  301-350=7 151-200=2  351-400=9 201-250=3  400 or >, call MD  units of insulin) 45 mL 3  . LANTUS SOLOSTAR 100 UNIT/ML Solostar Pen INJECT 20 UNITS SUBCUTANEOUSLY TWICE  DAILY (Patient taking differently: INJECT 20 UNITS SUBCUTANEOUSLY IN THE MORNING) 45 mL 3  . losartan (COZAAR) 25 MG tablet Take 25 mg by mouth daily.    . nitroGLYCERIN (NITROSTAT) 0.4 MG SL tablet Place 1 tablet (0.4 mg total) under the tongue every 5 (five) minutes as needed for chest pain. 25 tablet 2  . pantoprazole (PROTONIX) 40 MG tablet TAKE 1 TABLET EVERY DAY 90 tablet 1  . simvastatin (ZOCOR) 20 MG tablet TAKE 1 TABLET EVERY DAY 90 tablet 1  . torsemide (DEMADEX) 20 MG tablet TAKE 1 TABLET TWICE DAILY (Patient taking differently: TAKE 1 TABLET  DAILY EACH MORNING) 180 tablet 3  . traMADol (ULTRAM) 50 MG tablet Take 1 tablet (50 mg total) by mouth every 6 (six) hours as needed for moderate pain. 60 tablet 0  . traZODone (DESYREL) 50 MG tablet TAKE 1 TABLET AT BEDTIME 90 tablet 1   No current facility-administered medications on file prior to visit.    Allergies  Allergen Reactions  . Aspirin Other (See Comments)    High doses caused stomach bleeds   Social History   Social History  . Marital status: Widowed    Spouse name: N/A  . Number of children: N/A  . Years of education: N/A   Occupational History  . Not on file.   Social History Main Topics  . Smoking status: Never Smoker  . Smokeless tobacco: Never Used  . Alcohol use No  . Drug use: No  . Sexual activity: No   Other Topics Concern  . Not on file   Social History Narrative  . No narrative on file   History reviewed. No pertinent family history.    Review of Systems  All other systems reviewed and are negative.      Objective:   Physical Exam  Constitutional: She is oriented to person, place, and time. She appears well-developed. No distress.  HENT:  Head: Normocephalic and atraumatic.  Right Ear: External ear normal.  Left Ear: External ear normal.  Nose: Nose normal.  Mouth/Throat: Oropharynx is clear and moist.  Eyes: Pupils are equal, round, and reactive to light. Conjunctivae and EOM are  normal. Right eye exhibits no discharge. Left eye exhibits no discharge. No scleral icterus.  Neck: Normal range of motion. Neck supple. No JVD present. No tracheal deviation present. No thyromegaly present.  Cardiovascular: Normal rate, regular rhythm, normal heart sounds  and intact distal pulses.  Exam reveals no gallop and no friction rub.   No murmur heard. Pulmonary/Chest: Effort normal and breath sounds normal. No stridor. No respiratory distress. She has no wheezes. She has no rales. She exhibits no tenderness.  Abdominal: Soft. Bowel sounds are normal. She exhibits no distension and no mass. There is no tenderness. There is no rebound and no guarding.  Musculoskeletal: She exhibits no edema.  Lymphadenopathy:    She has no cervical adenopathy.  Neurological: She is alert and oriented to person, place, and time. No cranial nerve deficit.  Skin: Skin is warm. No rash noted. She is not diaphoretic. No erythema. No pallor.  Psychiatric: She has a normal mood and affect. Her behavior is normal. Judgment and thought content normal.  Vitals reviewed.       Assessment & Plan:    Anemia in stage 4 chronic kidney disease ([place]) - Plan: CBC with Differential/Platelet, CANCELED: Anemia panel  Need for immunization against influenza - Plan: Flu Vaccine QUAD 36+ mos IM  Patient looks much better today. Her color is better. Her energy level is better. Recheck a CBC today. Hemoglobin is 9.4 better, I would continue iron supplementation. If hemoglobin is worsening, I would consult nephrology vs hematology for possible iron infusions vs epo injections

## 2016-11-15 DIAGNOSIS — G4734 Idiopathic sleep related nonobstructive alveolar hypoventilation: Secondary | ICD-10-CM | POA: Diagnosis not present

## 2016-11-16 DIAGNOSIS — R809 Proteinuria, unspecified: Secondary | ICD-10-CM | POA: Diagnosis not present

## 2016-11-16 DIAGNOSIS — D649 Anemia, unspecified: Secondary | ICD-10-CM | POA: Diagnosis not present

## 2016-11-16 DIAGNOSIS — N184 Chronic kidney disease, stage 4 (severe): Secondary | ICD-10-CM | POA: Diagnosis not present

## 2016-11-16 DIAGNOSIS — I509 Heart failure, unspecified: Secondary | ICD-10-CM | POA: Diagnosis not present

## 2016-12-02 ENCOUNTER — Other Ambulatory Visit: Payer: Self-pay | Admitting: Family Medicine

## 2016-12-02 NOTE — Telephone Encounter (Signed)
ok 

## 2016-12-02 NOTE — Telephone Encounter (Signed)
Ok to refill 

## 2016-12-05 ENCOUNTER — Other Ambulatory Visit (HOSPITAL_COMMUNITY): Payer: Self-pay | Admitting: Nephrology

## 2016-12-05 DIAGNOSIS — N183 Chronic kidney disease, stage 3 unspecified: Secondary | ICD-10-CM

## 2016-12-06 ENCOUNTER — Encounter: Payer: Self-pay | Admitting: Nurse Practitioner

## 2016-12-06 ENCOUNTER — Ambulatory Visit (INDEPENDENT_AMBULATORY_CARE_PROVIDER_SITE_OTHER): Payer: Medicare HMO | Admitting: Nurse Practitioner

## 2016-12-06 VITALS — BP 120/51 | HR 75 | Temp 97.5°F | Ht 67.0 in | Wt 167.0 lb

## 2016-12-06 DIAGNOSIS — K5900 Constipation, unspecified: Secondary | ICD-10-CM | POA: Diagnosis not present

## 2016-12-06 DIAGNOSIS — K625 Hemorrhage of anus and rectum: Secondary | ICD-10-CM | POA: Diagnosis not present

## 2016-12-06 DIAGNOSIS — D649 Anemia, unspecified: Secondary | ICD-10-CM | POA: Diagnosis not present

## 2016-12-06 NOTE — Patient Instructions (Signed)
1. Have your blood work drawn when you're able to. 2. Follow-up with nephrology. 3. Continue your other prescribed medications. 4. Return for follow-up in 2 months. 5. Call us if you have any questions or concerns.

## 2016-12-06 NOTE — Progress Notes (Signed)
Referring Provider: Susy Frizzle, MD Primary Care Physician:  Susy Frizzle, MD Primary GI:  Dr. Gala Romney  Chief Complaint  Patient presents with  . Anemia    f/u  . Constipation    taking Miralax daily-helps    HPI:   Claudia [name] is a 83 y.o. female who presents For follow-up on anemia. The patient was last seen in our office 10/12/2016 for anemia. At that time it was noted she has chronic anemia and receiving blood transfusions recently. She is heme negative on her exam, small amount of hematochezia and noted a month prior with hemorrhoid surgery in April 2018. History of peptic ulcer disease in 2011. Anemia seems to be likely due to chronic disease in the setting of worsening renal function but recommended upper endoscopy for further evaluation. Colonoscopy on file.  EGD completed 10/17/2016 which found normal esophagus, normal stomach, normal duodenum. Felt hemoglobin dropped likely multifactorial in nature resulting from chronic kidney disease and hemorrhoid disease as well as recent surgery for hemorrhoids. Recommended follow-up office visit in 1 month and check a CBC at that time.  Laboratory results reviewed. CBC completed 11/14/2016 which found slightly worsened hemoglobin at 8.7 which was microcytic but normochromic. Primary care recommended nephrology consult for erythropoietin consideration ASAP and the patient was scheduled to see nephrology on 11/16/2016.  Today she states she's doing well overall. Energy still poor. Has seen nephrology who is reviewing her chart to make further decisions. Nephrology U/S scheduled for 10/16. Denies hematochezia. Admits dark stools on iron. Denies abdominal pain, N/V. Has worsening bruising (baseline for her) but no excessive bleeding. Has chronic constipation but MiraLAX controls symptoms well. Has small bowel movements 1-2 times a day. No sensation of incomplete emptying. Occasional dizziness, some dyspnea (which she attributes to  sitting around too much). Denies chest pain, lightheadedness, syncope, near syncope. Denies any other upper or lower GI symptoms.  Past Medical History:  Diagnosis Date  . Anemia   . Arthritis   . Cancer (HCC)    hx of skin cancer  . CHF (congestive heart failure) (HCC)    diastolic   . CKD (chronic kidney disease) stage 4, GFR 15-29 ml/min (HCC)   . Coronary artery disease   . Diabetes mellitus    insulin dependent  . GERD (gastroesophageal reflux disease)   . Headache(784.0)    rare migraines  . Hypercholesterolemia   . Hypertension   . Myocardial infarction ([place]) 2013  . Osteoporosis   . Proliferative retinopathy due to DM ([place])   . PUD (peptic ulcer disease)   . S/P colonoscopy Sept 2011   left-sided diverticula, tubular adenoma  . S/P endoscopy Aug 2011   3 superficial gastric ulcers, NSAID-induced  . Shortness of breath   . SIADH (syndrome of inappropriate ADH production) ([place])     Past Surgical History:  Procedure Laterality Date  . APPENDECTOMY    . CARDIAC CATHETERIZATION  01/22/2013  . cardiac stents  07/19/2011  . COLONOSCOPY  12/04/09   normal rectum/left-sided diverticula  . COLONOSCOPY N/A 05/26/2016   Dr. Gala Romney: grade 4 hemorrhoids likely source of hematochezia, diverticulosis in sigmoid and descending colon, element of rectal prolapse not excluded.   . CORONARY ANGIOPLASTY  07/2011  . CORONARY ARTERY BYPASS GRAFT N/A 11/08/2013   Procedure: CORONARY ARTERY BYPASS GRAFTING (CABG), on pump, times two, using left internal mammary artery, cryo saphenous vein.;  Surgeon: Ivin Poot, MD;  Location: [place];  Service: Open Heart Surgery;  Laterality: N/A;  LIMA-LAD CRYOVEIN -OM  . ESOPHAGOGASTRODUODENOSCOPY  10/16/09   normal without barrett's/three superficial gastric ulcers  . ESOPHAGOGASTRODUODENOSCOPY N/A 10/17/2016   Procedure: ESOPHAGOGASTRODUODENOSCOPY (EGD);  Surgeon: Daneil Dolin, MD;  [facility];  Service: Endoscopy;  Laterality: N/A;   1:30pm  . EYE SURGERY     cataracts  . HEMORRHOID SURGERY N/A 06/17/2016   Procedure: EXTENSIVE HEMORRHOIDECTOMY;  Surgeon: Aviva Signs, MD;  Location: [facility];  Service: General;  Laterality: N/A;  . INTRAOPERATIVE TRANSESOPHAGEAL ECHOCARDIOGRAM N/A 11/08/2013   Procedure: INTRAOPERATIVE TRANSESOPHAGEAL ECHOCARDIOGRAM;  Surgeon: Ivin Poot, MD;  Location: [place];  Service: Open Heart Surgery;  Laterality: N/A;  . LEFT HEART CATHETERIZATION WITH CORONARY ANGIOGRAM N/A 07/19/2011   Procedure: LEFT HEART CATHETERIZATION WITH CORONARY ANGIOGRAM;  Surgeon: Laverda Page, MD;  Location: [facility] CATH LAB;  Service: Cardiovascular;  Laterality: N/A;  . LEFT HEART CATHETERIZATION WITH CORONARY ANGIOGRAM N/A 12/21/2011   Procedure: LEFT HEART CATHETERIZATION WITH CORONARY ANGIOGRAM;  Surgeon: Laverda Page, MD;  Location: [facility] CATH LAB;  Service: Cardiovascular;  Laterality: N/A;  . LEFT HEART CATHETERIZATION WITH CORONARY ANGIOGRAM N/A 02/20/2012   Procedure: LEFT HEART CATHETERIZATION WITH CORONARY ANGIOGRAM;  Surgeon: Laverda Page, MD;  Location: [facility] CATH LAB;  Service: Cardiovascular;  Laterality: N/A;  . LEFT HEART CATHETERIZATION WITH CORONARY ANGIOGRAM N/A 09/03/2012   Procedure: LEFT HEART CATHETERIZATION WITH CORONARY ANGIOGRAM;  Surgeon: Laverda Page, MD;  Location: [facility] CATH LAB;  Service: Cardiovascular;  Laterality: N/A;  . LEFT HEART CATHETERIZATION WITH CORONARY ANGIOGRAM N/A 12/04/2012   Procedure: LEFT HEART CATHETERIZATION WITH CORONARY ANGIOGRAM;  Surgeon: Laverda Page, MD;  Location: [facility] CATH LAB;  Service: Cardiovascular;  Laterality: N/A;  . LEFT HEART CATHETERIZATION WITH CORONARY ANGIOGRAM N/A 01/22/2013   Procedure: LEFT HEART CATHETERIZATION WITH CORONARY ANGIOGRAM;  Surgeon: Laverda Page, MD;  Location: [facility] CATH LAB;  Service: Cardiovascular;  Laterality: N/A;  . LEFT HEART CATHETERIZATION WITH CORONARY ANGIOGRAM N/A 06/26/2013   Procedure: LEFT HEART  CATHETERIZATION WITH CORONARY ANGIOGRAM;  Surgeon: Laverda Page, MD;  Location: [facility] CATH LAB;  Service: Cardiovascular;  Laterality: N/A;  . LEFT HEART CATHETERIZATION WITH CORONARY ANGIOGRAM N/A 11/05/2013   Procedure: LEFT HEART CATHETERIZATION WITH CORONARY ANGIOGRAM;  Surgeon: Laverda Page, MD;  Location: [facility] CATH LAB;  Service: Cardiovascular;  Laterality: N/A;  . MITRAL VALVE REPLACEMENT N/A 11/08/2013   Procedure: MITRAL VALVE (MV) REPLACEMENT;  Surgeon: Ivin Poot, MD;  Location: [place];  Service: Open Heart Surgery;  Laterality: N/A;  #25 MAGNA MITRAL EASE  . PERCUTANEOUS CORONARY STENT INTERVENTION (PCI-S)  09/03/2012   Procedure: PERCUTANEOUS CORONARY STENT INTERVENTION (PCI-S);  Surgeon: Laverda Page, MD;  Location: [facility] CATH LAB;  Service: Cardiovascular;;  . TOE AMPUTATION Left 2013   left second toe amputation  . TONSILLECTOMY      Current Outpatient Prescriptions  Medication Sig Dispense Refill  . ACCU-CHEK FASTCLIX LANCETS MISC USE  TO TEST BLOOD SUGAR FOUR TIMES DAILY FASTING, BEFORE MEALS  AND AT BEDTIME DUE TO FLUCTUATING BLOOD SUGARS 408 each 3  . ACCU-CHEK SMARTVIEW test strip USE  TO TEST BLOOD SUGAR FOUR TIMES DAILY  FASTING, BEFORE MEALS, AND AT BEDTIME DUE TO FLUCTUATING BLOOD SUGARS 400 each 3  . ascorbic acid (VITAMIN C) 500 MG tablet Take 1 tablet (500 mg total) by mouth 2 (two) times daily. 60 tablet 11  . B-D ULTRAFINE III SHORT PEN 31G X 8 MM MISC USE  TO INJECT  INSULIN  5  TIMES  DAILY 450 each 3  . cetirizine (ZYRTEC) 10 MG tablet Take 1 tablet (10 mg total) by mouth at bedtime. 30 tablet 11  . doxazosin (CARDURA) 4 MG tablet TAKE 1 TABLET EVERY DAY (DISCONTINUE DOXAZOSIN 2MG ) 90 tablet 3  . ferrous sulfate 325 (65 FE) MG tablet Take 1 tablet (325 mg total) by mouth 2 (two) times daily with a meal. 60 tablet 11  . fluticasone (FLONASE) 50 MCG/ACT nasal spray USE 1 SPRAY IN EACH NOSTRIL EVERY DAY (Patient taking differently: USE 1 SPRAY IN EACH NOSTRIL  EVERY DAY AS NEEDED FOR ALLERGIES) 32 g 3  . insulin aspart (NOVOLOG FLEXPEN) 100 UNIT/ML FlexPen 70-120=0    251-300=5 121-150=1  301-350=7 151-200=2  351-400=9 201-250=3  400 or >, call MD  units of insulin (Patient taking differently: Inject 1-5 Units into the skin 3 (three) times daily as needed. 70-120=0    251-300=5 121-150=1  301-350=7 151-200=2  351-400=9 201-250=3  400 or >, call MD  units of insulin) 45 mL 3  . LANTUS SOLOSTAR 100 UNIT/ML Solostar Pen INJECT 20 UNITS SUBCUTANEOUSLY TWICE DAILY (Patient taking differently: INJECT 20 UNITS SUBCUTANEOUSLY IN THE MORNING) 45 mL 3  . losartan (COZAAR) 25 MG tablet Take 25 mg by mouth daily.    . nitroGLYCERIN (NITROSTAT) 0.4 MG SL tablet Place 1 tablet (0.4 mg total) under the tongue every 5 (five) minutes as needed for chest pain. 25 tablet 2  . pantoprazole (PROTONIX) 40 MG tablet TAKE 1 TABLET EVERY DAY 90 tablet 1  . polyethylene glycol (MIRALAX / GLYCOLAX) packet Take 17 g by mouth daily.    . simvastatin (ZOCOR) 20 MG tablet TAKE 1 TABLET EVERY DAY 90 tablet 1  . torsemide (DEMADEX) 20 MG tablet TAKE 1 TABLET TWICE DAILY (Patient taking differently: TAKE 1 TABLET  DAILY EACH MORNING) 180 tablet 3  . traMADol (ULTRAM) 50 MG tablet TAKE 1 TABLET BY MOUTH EVERY 6 HOURS AS NEEDED FOR PAIN 60 tablet 0  . traZODone (DESYREL) 50 MG tablet TAKE 1 TABLET AT BEDTIME 90 tablet 1   No current facility-administered medications for this visit.     Allergies as of 12/06/2016 - Review Complete 12/06/2016  Allergen Reaction Noted  . Aspirin Other (See Comments) 10/06/2010    Family History  Problem Relation Age of Onset  . Colon cancer Neg Hx     Social History   Social History  . Marital status: Widowed    Spouse name: N/A  . Number of children: N/A  . Years of education: N/A   Social History Main Topics  . Smoking status: Never Smoker  . Smokeless tobacco: Never Used  . Alcohol use No  . Drug use: No  . Sexual activity: No   Other  Topics Concern  . None   Social History Narrative  . None    Review of Systems: General: Negative for anorexia, weight loss, fever, chills. ENT: Negative for hoarseness, difficulty swallowing. CV: Negative for chest pain, angina, palpitations, dyspnea on exertion, peripheral edema.  Respiratory: Negative for dyspnea at rest, cough, sputum, wheezing.  GI: See history of present illness. MS: Chronic issues requires walker to ambulate.  Derm: Negative for rash or itching.   Endo: Negative for unusual weight change.  Heme: Negative for bruising or bleeding. Allergy: Negative for rash or hives.   Physical Exam: BP (!) 120/51   Pulse 75   Temp (!) 97.5 F (36.4 C) (Oral)   Ht 5'  7" (1.702 m)   Wt 167 lb (75.8 kg)   BMI 26.16 kg/m  General:   Alert and oriented. Pleasant and cooperative. Well-nourished and well-developed.  Eyes:  Without icterus, sclera clear and conjunctiva pink.  Ears:  Hard of hearing. Mouth:  No deformity or lesions, oral mucosa pink.  Throat/Neck:  Supple, without mass or thyromegaly. Cardiovascular:  S1, S2 present without murmurs appreciated. Extremities without clubbing or edema. Respiratory:  Clear to auscultation bilaterally. No wheezes, rales, or rhonchi. No distress.  Gastrointestinal:  +BS, soft, non-tender and non-distended. No HSM noted. No guarding or rebound. No masses appreciated.  Rectal:  Deferred  Musculoskalatal:  Symmetrical without gross deformities. Neurologic:  Alert and oriented x4;  grossly normal neurologically. Psych:  Alert and cooperative. Normal mood and affect. Heme/Lymph/Immune: No excessive bruising noted.    12/06/2016 9:03 AM   Disclaimer: This note was dictated with voice recognition software. Similar sounding words can inadvertently be transcribed and may not be corrected upon review.

## 2016-12-06 NOTE — Assessment & Plan Note (Signed)
Patient has a history of hemorrhoids and had rectal bleeding likely from hemorrhoids. She underwent hemorrhoidectomy and she states she bled for about 2-1/2 months after her surgery. No further bleeding recently. Heme stool card was apparently negative within the past couple months. Further anemia workup as per below. Return for follow-up in 2 months.

## 2016-12-06 NOTE — Assessment & Plan Note (Signed)
Significant iron deficiency anemia likely multifactorial. Colonoscopy and endoscopy up-to-date with no indications for GI bleed. Has not noted any further GI bleeding since hemorrhoid surgery bleeding resolved. She is currently undergoing workup with nephrology and has a renal ultrasound scheduled for next week. I will check a CBC today for monitoring. Recommend she follow-up with nephrology. Continue iron supplementation. Return for follow-up in 2 months.

## 2016-12-06 NOTE — Progress Notes (Signed)
CC'ED TO PCP 

## 2016-12-06 NOTE — Assessment & Plan Note (Signed)
Mild chronic constipation which she controls with MiraLAX effectively. Has one to 2 Small bowel movements a day. No sensation of incomplete emptying. Continue MiraLAX, follow-up as needed.

## 2016-12-09 DIAGNOSIS — K5900 Constipation, unspecified: Secondary | ICD-10-CM | POA: Diagnosis not present

## 2016-12-09 DIAGNOSIS — K625 Hemorrhage of anus and rectum: Secondary | ICD-10-CM | POA: Diagnosis not present

## 2016-12-09 DIAGNOSIS — D649 Anemia, unspecified: Secondary | ICD-10-CM | POA: Diagnosis not present

## 2016-12-09 LAB — CBC WITH DIFFERENTIAL/PLATELET
BASOS PCT: 0.3 %
Basophils Absolute: 10 cells/uL (ref 0–200)
EOS ABS: 40 {cells}/uL (ref 15–500)
Eosinophils Relative: 1.2 %
HCT: 22.2 % — ABNORMAL LOW (ref 35.0–45.0)
HEMOGLOBIN: 7.5 g/dL — AB (ref 11.7–15.5)
Lymphs Abs: 1551 cells/uL (ref 850–3900)
MCH: 30 pg (ref 27.0–33.0)
MCHC: 33.8 g/dL (ref 32.0–36.0)
MCV: 88.8 fL (ref 80.0–100.0)
MONOS PCT: 8.7 %
MPV: 10.2 fL (ref 7.5–12.5)
NEUTROS ABS: 1412 {cells}/uL — AB (ref 1500–7800)
Neutrophils Relative %: 42.8 %
Platelets: 160 10*3/uL (ref 140–400)
RBC: 2.5 10*6/uL — ABNORMAL LOW (ref 3.80–5.10)
RDW: 14.8 % (ref 11.0–15.0)
Total Lymphocyte: 47 %
WBC: 3.3 10*3/uL — ABNORMAL LOW (ref 3.8–10.8)
WBCMIX: 287 {cells}/uL (ref 200–950)

## 2016-12-15 ENCOUNTER — Ambulatory Visit (INDEPENDENT_AMBULATORY_CARE_PROVIDER_SITE_OTHER): Payer: Medicare HMO | Admitting: Ophthalmology

## 2016-12-15 DIAGNOSIS — H43813 Vitreous degeneration, bilateral: Secondary | ICD-10-CM | POA: Diagnosis not present

## 2016-12-15 DIAGNOSIS — H35033 Hypertensive retinopathy, bilateral: Secondary | ICD-10-CM

## 2016-12-15 DIAGNOSIS — H35372 Puckering of macula, left eye: Secondary | ICD-10-CM | POA: Diagnosis not present

## 2016-12-15 DIAGNOSIS — E113593 Type 2 diabetes mellitus with proliferative diabetic retinopathy without macular edema, bilateral: Secondary | ICD-10-CM

## 2016-12-15 DIAGNOSIS — G4734 Idiopathic sleep related nonobstructive alveolar hypoventilation: Secondary | ICD-10-CM | POA: Diagnosis not present

## 2016-12-15 DIAGNOSIS — E11319 Type 2 diabetes mellitus with unspecified diabetic retinopathy without macular edema: Secondary | ICD-10-CM | POA: Diagnosis not present

## 2016-12-15 DIAGNOSIS — I10 Essential (primary) hypertension: Secondary | ICD-10-CM

## 2016-12-20 ENCOUNTER — Ambulatory Visit (HOSPITAL_COMMUNITY)
Admission: RE | Admit: 2016-12-20 | Discharge: 2016-12-20 | Disposition: A | Discharge status: Home / Self Care | Payer: Medicare HMO | Source: Ambulatory Visit | Attending: Nephrology | Admitting: Nephrology

## 2016-12-20 DIAGNOSIS — N183 Chronic kidney disease, stage 3 unspecified: Secondary | ICD-10-CM

## 2016-12-20 DIAGNOSIS — D519 Vitamin B12 deficiency anemia, unspecified: Secondary | ICD-10-CM | POA: Diagnosis not present

## 2016-12-20 DIAGNOSIS — N281 Cyst of kidney, acquired: Secondary | ICD-10-CM | POA: Insufficient documentation

## 2016-12-20 DIAGNOSIS — R809 Proteinuria, unspecified: Secondary | ICD-10-CM | POA: Diagnosis not present

## 2016-12-20 DIAGNOSIS — I10 Essential (primary) hypertension: Secondary | ICD-10-CM | POA: Diagnosis not present

## 2016-12-20 DIAGNOSIS — D509 Iron deficiency anemia, unspecified: Secondary | ICD-10-CM | POA: Diagnosis not present

## 2016-12-20 DIAGNOSIS — E559 Vitamin D deficiency, unspecified: Secondary | ICD-10-CM | POA: Diagnosis not present

## 2016-12-20 DIAGNOSIS — Z1159 Encounter for screening for other viral diseases: Secondary | ICD-10-CM | POA: Diagnosis not present

## 2016-12-20 DIAGNOSIS — Z79899 Other long term (current) drug therapy: Secondary | ICD-10-CM | POA: Diagnosis not present

## 2017-01-04 DIAGNOSIS — M79601 Pain in right arm: Secondary | ICD-10-CM | POA: Diagnosis not present

## 2017-01-04 DIAGNOSIS — D509 Iron deficiency anemia, unspecified: Secondary | ICD-10-CM | POA: Diagnosis not present

## 2017-01-04 DIAGNOSIS — E872 Acidosis: Secondary | ICD-10-CM | POA: Diagnosis not present

## 2017-01-04 DIAGNOSIS — R809 Proteinuria, unspecified: Secondary | ICD-10-CM | POA: Diagnosis not present

## 2017-01-04 DIAGNOSIS — E559 Vitamin D deficiency, unspecified: Secondary | ICD-10-CM | POA: Diagnosis not present

## 2017-01-04 DIAGNOSIS — N184 Chronic kidney disease, stage 4 (severe): Secondary | ICD-10-CM | POA: Diagnosis not present

## 2017-01-04 DIAGNOSIS — N2581 Secondary hyperparathyroidism of renal origin: Secondary | ICD-10-CM | POA: Diagnosis not present

## 2017-01-06 ENCOUNTER — Encounter (HOSPITAL_COMMUNITY)
Admission: RE | Admit: 2017-01-06 | Discharge: 2017-01-06 | Disposition: A | Discharge status: Home / Self Care | Payer: Medicare HMO | Source: Ambulatory Visit | Attending: Nephrology | Admitting: Nephrology

## 2017-01-06 DIAGNOSIS — D509 Iron deficiency anemia, unspecified: Secondary | ICD-10-CM | POA: Insufficient documentation

## 2017-01-06 MED ORDER — SODIUM CHLORIDE 0.9 % IV SOLN
INTRAVENOUS | Status: DC
  Administered 2017-01-06: 250 mL via INTRAVENOUS

## 2017-01-06 MED ORDER — SODIUM CHLORIDE 0.9 % IV SOLN
510.0000 mg | Freq: Once | INTRAVENOUS | Status: AC
  Administered 2017-01-06: 510 mg via INTRAVENOUS
  Filled 2017-01-06: qty 17

## 2017-01-10 ENCOUNTER — Ambulatory Visit: Payer: Medicare HMO | Admitting: Family Medicine

## 2017-01-10 VITALS — Temp 97.9°F | Resp 18 | Ht 67.0 in | Wt 182.0 lb

## 2017-01-10 DIAGNOSIS — L0390 Cellulitis, unspecified: Secondary | ICD-10-CM

## 2017-01-10 DIAGNOSIS — L304 Erythema intertrigo: Secondary | ICD-10-CM

## 2017-01-10 DIAGNOSIS — S51011A Laceration without foreign body of right elbow, initial encounter: Secondary | ICD-10-CM | POA: Diagnosis not present

## 2017-01-10 MED ORDER — CEPHALEXIN 500 MG PO CAPS: 500.0000 mg | ORAL_CAPSULE | Freq: Three times a day (TID) | ORAL | 0 refills | Status: DC

## 2017-01-10 MED ORDER — CLOTRIMAZOLE-BETAMETHASONE 1-0.05 % EX CREA: 1.0000 "application " | TOPICAL_CREAM | Freq: Two times a day (BID) | CUTANEOUS | 0 refills | Status: DC

## 2017-01-10 NOTE — Progress Notes (Signed)
Subjective:     Patient ID: Claudia [name], female   DOB: 1931-08-30, 83 y.o.   MRN: [id number]  HPI Patient has a rash under her left breast.  Rashes in the intertriginous fold.  It appears to be Candida intertrigo.  He has a bright red patch of skin directly over top of the skin fold.  There are satellite patches of erythema.  It runs the length of the breast.  However the surrounding skin has become erythematous warm and painful and appears to be secondarily infected.  She also recently fell and suffered a skin tear over her right lateral epicondyle.  Her son tried to dress the wound as best as he could.  There is a 4 inch x 1 inch skin tear over the lateral epicondyle that is actively bleeding today after the dressing was removed.  The skin around this is also becoming pink warm and tender.  The patient also appears to be slightly more confused than her baseline. Past Medical History:  Diagnosis Date  . Anemia   . Arthritis   . Cancer (HCC)    hx of skin cancer  . CHF (congestive heart failure) (HCC)    diastolic   . CKD (chronic kidney disease) stage 4, GFR 15-29 ml/min (HCC)   . Coronary artery disease   . Diabetes mellitus    insulin dependent  . GERD (gastroesophageal reflux disease)   . Headache(784.0)    rare migraines  . Hypercholesterolemia   . Hypertension   . Myocardial infarction ([place]) 2013  . Osteoporosis   . Proliferative retinopathy due to DM ([place])   . PUD (peptic ulcer disease)   . S/P colonoscopy Sept 2011   left-sided diverticula, tubular adenoma  . S/P endoscopy Aug 2011   3 superficial gastric ulcers, NSAID-induced  . Shortness of breath   . SIADH (syndrome of inappropriate ADH production) ([place])    Past Surgical History:  Procedure Laterality Date  . APPENDECTOMY    . CARDIAC CATHETERIZATION  01/22/2013  . cardiac stents  07/19/2011  . COLONOSCOPY  12/04/09   normal rectum/left-sided diverticula  . CORONARY ANGIOPLASTY  07/2011  .  ESOPHAGOGASTRODUODENOSCOPY  10/16/09   normal without barrett's/three superficial gastric ulcers  . EYE SURGERY     cataracts  . TOE AMPUTATION Left 2013   left second toe amputation  . TONSILLECTOMY     Current Outpatient Medications on File Prior to Visit  Medication Sig Dispense Refill  . ACCU-CHEK FASTCLIX LANCETS MISC USE  TO TEST BLOOD SUGAR FOUR TIMES DAILY FASTING, BEFORE MEALS  AND AT BEDTIME DUE TO FLUCTUATING BLOOD SUGARS 408 each 3  . ACCU-CHEK SMARTVIEW test strip USE  TO TEST BLOOD SUGAR FOUR TIMES DAILY  FASTING, BEFORE MEALS, AND AT BEDTIME DUE TO FLUCTUATING BLOOD SUGARS 400 each 3  . aspirin EC 81 MG tablet Take 81 mg daily by mouth.    . B-D ULTRAFINE III SHORT PEN 31G X 8 MM MISC USE  TO INJECT  INSULIN  5  TIMES  DAILY 450 each 3  . cetirizine (ZYRTEC) 10 MG tablet Take 1 tablet (10 mg total) by mouth at bedtime. 30 tablet 11  . Cholecalciferol (VITAMIN D3) 50000 units CAPS Take 1 capsule once a week by mouth.    . doxazosin (CARDURA) 4 MG tablet TAKE 1 TABLET EVERY DAY (DISCONTINUE DOXAZOSIN 2MG ) 90 tablet 3  . fluticasone (FLONASE) 50 MCG/ACT nasal spray USE 1 SPRAY IN EACH NOSTRIL EVERY DAY (Patient taking differently: USE  1 SPRAY IN EACH NOSTRIL EVERY DAY AS NEEDED FOR ALLERGIES) 32 g 3  . insulin aspart (NOVOLOG FLEXPEN) 100 UNIT/ML FlexPen 70-120=0    251-300=5 121-150=1  301-350=7 151-200=2  351-400=9 201-250=3  400 or >, call MD  units of insulin (Patient taking differently: Inject 1-5 Units into the skin 3 (three) times daily as needed. 70-120=0    251-300=5 121-150=1  301-350=7 151-200=2  351-400=9 201-250=3  400 or >, call MD  units of insulin) 45 mL 3  . LANTUS SOLOSTAR 100 UNIT/ML Solostar Pen INJECT 20 UNITS SUBCUTANEOUSLY TWICE DAILY (Patient taking differently: INJECT 20 UNITS SUBCUTANEOUSLY IN THE MORNING) 45 mL 3  . losartan (COZAAR) 25 MG tablet Take 25 mg by mouth daily.    . nitroGLYCERIN (NITROSTAT) 0.4 MG SL tablet Place 1 tablet (0.4 mg total) under  the tongue every 5 (five) minutes as needed for chest pain. 25 tablet 2  . pantoprazole (PROTONIX) 40 MG tablet TAKE 1 TABLET EVERY DAY 90 tablet 1  . polyethylene glycol (MIRALAX / GLYCOLAX) packet Take 17 g by mouth daily.    . simvastatin (ZOCOR) 20 MG tablet TAKE 1 TABLET EVERY DAY 90 tablet 1  . sodium bicarbonate 650 MG tablet 1 tab po tid  3  . torsemide (DEMADEX) 20 MG tablet TAKE 1 TABLET TWICE DAILY (Patient taking differently: 3 tabs po qam) 180 tablet 3  . traMADol (ULTRAM) 50 MG tablet TAKE 1 TABLET BY MOUTH EVERY 6 HOURS AS NEEDED FOR PAIN 60 tablet 0  . traZODone (DESYREL) 50 MG tablet TAKE 1 TABLET AT BEDTIME 90 tablet 1   No current facility-administered medications on file prior to visit.    Allergies  Allergen Reactions  . Aspirin Other (See Comments)    High doses caused stomach bleeds   Social History   Socioeconomic History  . Marital status: Widowed    Spouse name: Not on file  . Number of children: Not on file  . Years of education: Not on file  . Highest education level: Not on file  Social Needs  . Financial resource strain: Not on file  . Food insecurity - worry: Not on file  . Food insecurity - inability: Not on file  . Transportation needs - medical: Not on file  . Transportation needs - non-medical: Not on file  Occupational History  . Not on file  Tobacco Use  . Smoking status: Never Smoker  . Smokeless tobacco: Never Used  Substance and Sexual Activity  . Alcohol use: No  . Drug use: No  . Sexual activity: No    Birth control/protection: Post-menopausal  Other Topics Concern  . Not on file  Social History Narrative  . Not on file     Review of Systems  All other systems reviewed and are negative.      Objective:   Physical Exam  Constitutional: She appears well-developed and well-nourished.  Cardiovascular: Normal rate and normal heart sounds.  Pulmonary/Chest: Effort normal and breath sounds normal. No respiratory distress. She  has no wheezes. She exhibits tenderness. Right breast exhibits no skin change. Left breast exhibits skin change and tenderness.    Musculoskeletal:       Arms: Skin: Rash noted.       Assessment:     Cellulitis, unspecified cellulitis site  Intertrigo  Skin tear of right elbow without complication, initial encounter      Plan:        I believe the patient has Candida intertrigo under  the left breast and that the rash has subsequently become secondarily infected.  I will treat the Candida intertrigo and with Lotrisone cream applied twice daily for 7-14 days.  I will treat the secondary cellulitis under the left breast and also around the right elbow with Keflex 500 mg p.o. 3 times daily for 7 days.  I will dress the skin tear with Tegaderm and wrapped it with nonadherent gauze and Coban.  Recheck the wound in 48 hours.

## 2017-01-10 NOTE — Progress Notes (Signed)
error 

## 2017-01-13 ENCOUNTER — Encounter (HOSPITAL_COMMUNITY)
Admission: RE | Admit: 2017-01-13 | Discharge: 2017-01-13 | Disposition: A | Discharge status: Home / Self Care | Payer: Medicare HMO | Source: Ambulatory Visit | Attending: Nephrology | Admitting: Nephrology

## 2017-01-13 ENCOUNTER — Ambulatory Visit: Payer: Medicare HMO | Admitting: Family Medicine

## 2017-01-13 ENCOUNTER — Encounter (HOSPITAL_COMMUNITY): Payer: Self-pay

## 2017-01-13 ENCOUNTER — Encounter: Payer: Self-pay | Admitting: Family Medicine

## 2017-01-13 VITALS — BP 140/60 | HR 56 | Temp 97.4°F | Resp 16 | Ht 67.0 in | Wt 183.0 lb

## 2017-01-13 DIAGNOSIS — M7989 Other specified soft tissue disorders: Secondary | ICD-10-CM | POA: Diagnosis not present

## 2017-01-13 DIAGNOSIS — S51011A Laceration without foreign body of right elbow, initial encounter: Secondary | ICD-10-CM

## 2017-01-13 DIAGNOSIS — D509 Iron deficiency anemia, unspecified: Secondary | ICD-10-CM | POA: Diagnosis not present

## 2017-01-13 DIAGNOSIS — N184 Chronic kidney disease, stage 4 (severe): Secondary | ICD-10-CM

## 2017-01-13 MED ORDER — SODIUM CHLORIDE 0.9 % IV SOLN
510.0000 mg | Freq: Once | INTRAVENOUS | Status: AC
  Administered 2017-01-13: 510 mg via INTRAVENOUS
  Filled 2017-01-13: qty 17

## 2017-01-13 MED ORDER — SODIUM CHLORIDE 0.9 % IV SOLN
INTRAVENOUS | Status: DC
  Administered 2017-01-13: 11:00:00 via INTRAVENOUS

## 2017-01-13 MED ORDER — POTASSIUM CHLORIDE CRYS ER 20 MEQ PO TBCR: 20.0000 meq | EXTENDED_RELEASE_TABLET | Freq: Every day | ORAL | 3 refills | Status: DC

## 2017-01-13 NOTE — Progress Notes (Signed)
Subjective:     Patient ID: Claudia [name], female   DOB: 1931/11/22, 83 y.o.   MRN: [id number]  HPI  01/10/17 Patient has a rash under her left breast.  Rashes in the intertriginous fold.  It appears to be Candida intertrigo.  He has a bright red patch of skin directly over top of the skin fold.  There are satellite patches of erythema.  It runs the length of the breast.  However the surrounding skin has become erythematous warm and painful and appears to be secondarily infected.  She also recently fell and suffered a skin tear over her right lateral epicondyle.  Her son tried to dress the wound as best as he could.  There is a 4 inch x 1 inch skin tear over the lateral epicondyle that is actively bleeding today after the dressing was removed.  The skin around this is also becoming pink warm and tender.  The patient also appears to be slightly more confused than her baseline.  At that time, my plan was:    I believe the patient has Candida intertrigo under the left breast and that the rash has subsequently become secondarily infected.  I will treat the Candida intertrigo and with Lotrisone cream applied twice daily for 7-14 days.  I will treat the secondary cellulitis under the left breast and also around the right elbow with Keflex 500 mg p.o. 3 times daily for 7 days.  I will dress the skin tear with Tegaderm and wrapped it with nonadherent gauze and Coban.  Recheck the wound in 48 hours.  01/13/17 The rash under her left breast already looks much better despite only being on the antibiotics for Lotrisone for 3 days.  The erythema is also fading from around her right elbow although she still has a very large skin tear.  I redressed the skin tear today.  I removed the Tegaderm.  I took 2 nonadherent gauze.  I covered them with Silvadene cream.  I applied them to the skin tear over the lateral epicondyle and gently wrap the entire elbow with co-band.  I would recheck this on Monday and finish her  antibiotics.  However she has +1 pitting edema increasing to +2 pitting edema over the last few days in both legs.  This extends up to her knees and she is also having some more shortness of breath.  She has a history of chronic kidney disease as well as congestive heart failure.  She is currently on torsemide 60 mg a day Past Medical History:  Diagnosis Date  . Anemia   . Arthritis   . Cancer (HCC)    hx of skin cancer  . CHF (congestive heart failure) (HCC)    diastolic   . CKD (chronic kidney disease) stage 4, GFR 15-29 ml/min (HCC)   . Coronary artery disease   . Diabetes mellitus    insulin dependent  . GERD (gastroesophageal reflux disease)   . Headache(784.0)    rare migraines  . Hypercholesterolemia   . Hypertension   . Myocardial infarction ([place]) 2013  . Osteoporosis   . Proliferative retinopathy due to DM ([place])   . PUD (peptic ulcer disease)   . S/P colonoscopy Sept 2011   left-sided diverticula, tubular adenoma  . S/P endoscopy Aug 2011   3 superficial gastric ulcers, NSAID-induced  . Shortness of breath   . SIADH (syndrome of inappropriate ADH production) ([place])    Past Surgical History:  Procedure Laterality Date  . APPENDECTOMY    .  CARDIAC CATHETERIZATION  01/22/2013  . cardiac stents  07/19/2011  . COLONOSCOPY  12/04/09   normal rectum/left-sided diverticula  . CORONARY ANGIOPLASTY  07/2011  . ESOPHAGOGASTRODUODENOSCOPY  10/16/09   normal without barrett's/three superficial gastric ulcers  . EYE SURGERY     cataracts  . TOE AMPUTATION Left 2013   left second toe amputation  . TONSILLECTOMY     Current Outpatient Medications on File Prior to Visit  Medication Sig Dispense Refill  . ACCU-CHEK FASTCLIX LANCETS MISC USE  TO TEST BLOOD SUGAR FOUR TIMES DAILY FASTING, BEFORE MEALS  AND AT BEDTIME DUE TO FLUCTUATING BLOOD SUGARS 408 each 3  . ACCU-CHEK SMARTVIEW test strip USE  TO TEST BLOOD SUGAR FOUR TIMES DAILY  FASTING, BEFORE MEALS, AND AT BEDTIME DUE TO  FLUCTUATING BLOOD SUGARS 400 each 3  . aspirin EC 81 MG tablet Take 81 mg daily by mouth.    . B-D ULTRAFINE III SHORT PEN 31G X 8 MM MISC USE  TO INJECT  INSULIN  5  TIMES  DAILY 450 each 3  . cephALEXin (KEFLEX) 500 MG capsule Take 1 capsule (500 mg total) 3 (three) times daily by mouth. 21 capsule 0  . cetirizine (ZYRTEC) 10 MG tablet Take 1 tablet (10 mg total) by mouth at bedtime. 30 tablet 11  . Cholecalciferol (VITAMIN D3) 50000 units CAPS Take 1 capsule once a week by mouth.    . clotrimazole-betamethasone (LOTRISONE) cream Apply 1 application 2 (two) times daily topically. 30 g 0  . doxazosin (CARDURA) 4 MG tablet TAKE 1 TABLET EVERY DAY (DISCONTINUE DOXAZOSIN 2MG ) 90 tablet 3  . fluticasone (FLONASE) 50 MCG/ACT nasal spray USE 1 SPRAY IN EACH NOSTRIL EVERY DAY (Patient taking differently: USE 1 SPRAY IN EACH NOSTRIL EVERY DAY AS NEEDED FOR ALLERGIES) 32 g 3  . insulin aspart (NOVOLOG FLEXPEN) 100 UNIT/ML FlexPen 70-120=0    251-300=5 121-150=1  301-350=7 151-200=2  351-400=9 201-250=3  400 or >, call MD  units of insulin (Patient taking differently: Inject 1-5 Units into the skin 3 (three) times daily as needed. 70-120=0    251-300=5 121-150=1  301-350=7 151-200=2  351-400=9 201-250=3  400 or >, call MD  units of insulin) 45 mL 3  . LANTUS SOLOSTAR 100 UNIT/ML Solostar Pen INJECT 20 UNITS SUBCUTANEOUSLY TWICE DAILY (Patient taking differently: INJECT 20 UNITS SUBCUTANEOUSLY IN THE MORNING) 45 mL 3  . losartan (COZAAR) 25 MG tablet Take 25 mg by mouth daily.    . nitroGLYCERIN (NITROSTAT) 0.4 MG SL tablet Place 1 tablet (0.4 mg total) under the tongue every 5 (five) minutes as needed for chest pain. 25 tablet 2  . pantoprazole (PROTONIX) 40 MG tablet TAKE 1 TABLET EVERY DAY 90 tablet 1  . polyethylene glycol (MIRALAX / GLYCOLAX) packet Take 17 g by mouth daily.    . simvastatin (ZOCOR) 20 MG tablet TAKE 1 TABLET EVERY DAY 90 tablet 1  . sodium bicarbonate 650 MG tablet 1 tab po tid  3  .  torsemide (DEMADEX) 20 MG tablet TAKE 1 TABLET TWICE DAILY (Patient taking differently: 3 tabs po qam) 180 tablet 3  . traMADol (ULTRAM) 50 MG tablet TAKE 1 TABLET BY MOUTH EVERY 6 HOURS AS NEEDED FOR PAIN 60 tablet 0  . traZODone (DESYREL) 50 MG tablet TAKE 1 TABLET AT BEDTIME 90 tablet 1   Current Facility-Administered Medications on File Prior to Visit  Medication Dose Route Frequency Provider Last Rate Last Dose  . 0.9 %  sodium chloride infusion  Intravenous Continuous Fran Lowes, MD 10 mL/hr at 01/13/17 1119     Allergies  Allergen Reactions  . Aspirin Other (See Comments)    High doses caused stomach bleeds   Social History   Socioeconomic History  . Marital status: Widowed    Spouse name: Not on file  . Number of children: Not on file  . Years of education: Not on file  . Highest education level: Not on file  Social Needs  . Financial resource strain: Not on file  . Food insecurity - worry: Not on file  . Food insecurity - inability: Not on file  . Transportation needs - medical: Not on file  . Transportation needs - non-medical: Not on file  Occupational History  . Not on file  Tobacco Use  . Smoking status: Never Smoker  . Smokeless tobacco: Never Used  Substance and Sexual Activity  . Alcohol use: No  . Drug use: No  . Sexual activity: No    Birth control/protection: Post-menopausal  Other Topics Concern  . Not on file  Social History Narrative  . Not on file     Review of Systems  All other systems reviewed and are negative.      Objective:   Physical Exam  Constitutional: She appears well-developed and well-nourished.  Cardiovascular: Normal rate and normal heart sounds.  Pulmonary/Chest: Effort normal and breath sounds normal. No respiratory distress. She has no wheezes. She exhibits tenderness. Right breast exhibits no skin change. Left breast exhibits skin change and tenderness.    Musculoskeletal:       Arms: Skin: Rash noted.        Assessment:     Leg swelling - Plan: BASIC METABOLIC PANEL WITH GFR  CKD (chronic kidney disease), stage IV (HCC)  Skin tear of right elbow without complication, initial encounter      Plan:    The skin tear is redressed in the office today as described in the history of present illness.  Cellulitis under the left breast and intertrigo under the left breast is resolving.  Cellulitis around the right elbow is also resolving.  Her mental status is returning to her baseline.  The dressing needs to be changed on Monday at an office visit.  However the patient appears to be fluid overloaded.  I will temporarily increase the torsemide to 60 mg twice daily.  I will add potassium 20 mEq once daily and recheck the patient's BMP today and again on Monday when I recheck her monitoring closely for dehydration.

## 2017-01-14 LAB — BASIC METABOLIC PANEL WITH GFR
BUN / CREAT RATIO: 28 (calc) — AB (ref 6–22)
BUN: 69 mg/dL — AB (ref 7–25)
CALCIUM: 8.4 mg/dL — AB (ref 8.6–10.4)
CHLORIDE: 102 mmol/L (ref 98–110)
CO2: 22 mmol/L (ref 20–32)
CREATININE: 2.46 mg/dL — AB (ref 0.60–0.88)
GFR, EST AFRICAN AMERICAN: 20 mL/min/{1.73_m2} — AB (ref >=60)
GFR, Est Non African American: 18 mL/min/{1.73_m2} — ABNORMAL LOW (ref >=60)
Glucose, Bld: 76 mg/dL (ref 65–99)
Potassium: 4.4 mmol/L (ref 3.5–5.3)
Sodium: 133 mmol/L — ABNORMAL LOW (ref 135–146)

## 2017-01-15 DIAGNOSIS — G4734 Idiopathic sleep related nonobstructive alveolar hypoventilation: Secondary | ICD-10-CM | POA: Diagnosis not present

## 2017-01-16 ENCOUNTER — Ambulatory Visit (INDEPENDENT_AMBULATORY_CARE_PROVIDER_SITE_OTHER): Payer: Medicare HMO | Admitting: Family Medicine

## 2017-01-16 ENCOUNTER — Encounter: Payer: Self-pay | Admitting: Family Medicine

## 2017-01-16 VITALS — BP 110/60 | HR 84 | Temp 97.8°F | Resp 16 | Ht 67.0 in | Wt 180.0 lb

## 2017-01-16 DIAGNOSIS — N184 Chronic kidney disease, stage 4 (severe): Secondary | ICD-10-CM

## 2017-01-16 DIAGNOSIS — M7989 Other specified soft tissue disorders: Secondary | ICD-10-CM | POA: Diagnosis not present

## 2017-01-16 LAB — BASIC METABOLIC PANEL WITH GFR
BUN/Creatinine Ratio: 27 (calc) — ABNORMAL HIGH (ref 6–22)
BUN: 68 mg/dL — ABNORMAL HIGH (ref 7–25)
CALCIUM: 8.1 mg/dL — AB (ref 8.6–10.4)
CHLORIDE: 100 mmol/L (ref 98–110)
CO2: 24 mmol/L (ref 20–32)
Creat: 2.56 mg/dL — ABNORMAL HIGH (ref 0.60–0.88)
GFR, EST AFRICAN AMERICAN: 20 mL/min/{1.73_m2} — AB (ref >=60)
GFR, Est Non African American: 17 mL/min/{1.73_m2} — ABNORMAL LOW (ref >=60)
GLUCOSE: 161 mg/dL — AB (ref 65–99)
POTASSIUM: 5.2 mmol/L (ref 3.5–5.3)
Sodium: 132 mmol/L — ABNORMAL LOW (ref 135–146)

## 2017-01-16 NOTE — Progress Notes (Signed)
Subjective:     Patient ID: Claudia [name], female   DOB: 02/23/1932, 83 y.o.   MRN: [id number]  HPI  01/10/17 Patient has a rash under her left breast.  Rashes in the intertriginous fold.  It appears to be Candida intertrigo.  He has a bright red patch of skin directly over top of the skin fold.  There are satellite patches of erythema.  It runs the length of the breast.  However the surrounding skin has become erythematous warm and painful and appears to be secondarily infected.  She also recently fell and suffered a skin tear over her right lateral epicondyle.  Her son tried to dress the wound as best as he could.  There is a 4 inch x 1 inch skin tear over the lateral epicondyle that is actively bleeding today after the dressing was removed.  The skin around this is also becoming pink warm and tender.  The patient also appears to be slightly more confused than her baseline.  At that time, my plan was:    I believe the patient has Candida intertrigo under the left breast and that the rash has subsequently become secondarily infected.  I will treat the Candida intertrigo and with Lotrisone cream applied twice daily for 7-14 days.  I will treat the secondary cellulitis under the left breast and also around the right elbow with Keflex 500 mg p.o. 3 times daily for 7 days.  I will dress the skin tear with Tegaderm and wrapped it with nonadherent gauze and Coban.  Recheck the wound in 48 hours.  01/13/17 The rash under her left breast already looks much better despite only being on the antibiotics for Lotrisone for 3 days.  The erythema is also fading from around her right elbow although she still has a very large skin tear.  I redressed the skin tear today.  I removed the Tegaderm.  I took 2 nonadherent gauze.  I covered them with Silvadene cream.  I applied them to the skin tear over the lateral epicondyle and gently wrap the entire elbow with co-band.  I would recheck this on Monday and finish her  antibiotics.  However she has +1 pitting edema increasing to +2 pitting edema over the last few days in both legs.  This extends up to her knees and she is also having some more shortness of breath.  She has a history of chronic kidney disease as well as congestive heart failure.  She is currently on torsemide 60 mg a day.  At that time, my plan was:   The skin tear is redressed in the office today as described in the history of present illness.  Cellulitis under the left breast and intertrigo under the left breast is resolving.  Cellulitis around the right elbow is also resolving.  Her mental status is returning to her baseline.  The dressing needs to be changed on Monday at an office visit.  However the patient appears to be fluid overloaded.  I will temporarily increase the torsemide to 60 mg twice daily.  I will add potassium 20 mEq once daily and recheck the patient's BMP today and again on Monday when I recheck her monitoring closely for dehydration.  01/16/17 Here for follow up.  Redness above right elbow and under left breast has completely subsided.  Cellulitis has resolved.  Skin tear is healing albeit slowly.  I removed the previous dressing and replaced it with a Tegaderm.  She continues to have significant swelling  in both legs but denies any chest pain or shortness of breath.  She has +2 edema in both legs to the knees similar to last week however she has lost 3 pounds since her last visit Past Medical History:  Diagnosis Date  . Anemia   . Arthritis   . Cancer (HCC)    hx of skin cancer  . CHF (congestive heart failure) (HCC)    diastolic   . CKD (chronic kidney disease) stage 4, GFR 15-29 ml/min (HCC)   . Coronary artery disease   . Diabetes mellitus    insulin dependent  . GERD (gastroesophageal reflux disease)   . Headache(784.0)    rare migraines  . Hypercholesterolemia   . Hypertension   . Myocardial infarction ([place]) 2013  . Osteoporosis   . Proliferative retinopathy due  to DM ([place])   . PUD (peptic ulcer disease)   . S/P colonoscopy Sept 2011   left-sided diverticula, tubular adenoma  . S/P endoscopy Aug 2011   3 superficial gastric ulcers, NSAID-induced  . Shortness of breath   . SIADH (syndrome of inappropriate ADH production) ([place])    Past Surgical History:  Procedure Laterality Date  . APPENDECTOMY    . CARDIAC CATHETERIZATION  01/22/2013  . cardiac stents  07/19/2011  . COLONOSCOPY  12/04/09   normal rectum/left-sided diverticula  . CORONARY ANGIOPLASTY  07/2011  . ESOPHAGOGASTRODUODENOSCOPY  10/16/09   normal without barrett's/three superficial gastric ulcers  . EYE SURGERY     cataracts  . TOE AMPUTATION Left 2013   left second toe amputation  . TONSILLECTOMY     Current Outpatient Medications on File Prior to Visit  Medication Sig Dispense Refill  . ACCU-CHEK FASTCLIX LANCETS MISC USE  TO TEST BLOOD SUGAR FOUR TIMES DAILY FASTING, BEFORE MEALS  AND AT BEDTIME DUE TO FLUCTUATING BLOOD SUGARS 408 each 3  . ACCU-CHEK SMARTVIEW test strip USE  TO TEST BLOOD SUGAR FOUR TIMES DAILY  FASTING, BEFORE MEALS, AND AT BEDTIME DUE TO FLUCTUATING BLOOD SUGARS 400 each 3  . aspirin EC 81 MG tablet Take 81 mg daily by mouth.    . B-D ULTRAFINE III SHORT PEN 31G X 8 MM MISC USE  TO INJECT  INSULIN  5  TIMES  DAILY 450 each 3  . cephALEXin (KEFLEX) 500 MG capsule Take 1 capsule (500 mg total) 3 (three) times daily by mouth. 21 capsule 0  . cetirizine (ZYRTEC) 10 MG tablet Take 1 tablet (10 mg total) by mouth at bedtime. 30 tablet 11  . Cholecalciferol (VITAMIN D3) 50000 units CAPS Take 1 capsule once a week by mouth.    . clotrimazole-betamethasone (LOTRISONE) cream Apply 1 application 2 (two) times daily topically. 30 g 0  . doxazosin (CARDURA) 4 MG tablet TAKE 1 TABLET EVERY DAY (DISCONTINUE DOXAZOSIN 2MG ) 90 tablet 3  . fluticasone (FLONASE) 50 MCG/ACT nasal spray USE 1 SPRAY IN EACH NOSTRIL EVERY DAY (Patient taking differently: USE 1 SPRAY IN EACH NOSTRIL  EVERY DAY AS NEEDED FOR ALLERGIES) 32 g 3  . insulin aspart (NOVOLOG FLEXPEN) 100 UNIT/ML FlexPen 70-120=0    251-300=5 121-150=1  301-350=7 151-200=2  351-400=9 201-250=3  400 or >, call MD  units of insulin (Patient taking differently: Inject 1-5 Units into the skin 3 (three) times daily as needed. 70-120=0    251-300=5 121-150=1  301-350=7 151-200=2  351-400=9 201-250=3  400 or >, call MD  units of insulin) 45 mL 3  . LANTUS SOLOSTAR 100 UNIT/ML Solostar Pen INJECT  20 UNITS SUBCUTANEOUSLY TWICE DAILY (Patient taking differently: INJECT 20 UNITS SUBCUTANEOUSLY IN THE MORNING) 45 mL 3  . losartan (COZAAR) 25 MG tablet Take 25 mg by mouth daily.    . nitroGLYCERIN (NITROSTAT) 0.4 MG SL tablet Place 1 tablet (0.4 mg total) under the tongue every 5 (five) minutes as needed for chest pain. 25 tablet 2  . pantoprazole (PROTONIX) 40 MG tablet TAKE 1 TABLET EVERY DAY 90 tablet 1  . polyethylene glycol (MIRALAX / GLYCOLAX) packet Take 17 g by mouth daily.    . potassium chloride SA (K-DUR,KLOR-CON) 20 MEQ tablet Take 1 tablet (20 mEq total) daily by mouth. 30 tablet 3  . simvastatin (ZOCOR) 20 MG tablet TAKE 1 TABLET EVERY DAY 90 tablet 1  . sodium bicarbonate 650 MG tablet 1 tab po tid  3  . torsemide (DEMADEX) 20 MG tablet TAKE 1 TABLET TWICE DAILY (Patient taking differently: 3 tabs po bid) 180 tablet 3  . traMADol (ULTRAM) 50 MG tablet TAKE 1 TABLET BY MOUTH EVERY 6 HOURS AS NEEDED FOR PAIN 60 tablet 0  . traZODone (DESYREL) 50 MG tablet TAKE 1 TABLET AT BEDTIME 90 tablet 1   No current facility-administered medications on file prior to visit.    Allergies  Allergen Reactions  . Aspirin Other (See Comments)    High doses caused stomach bleeds   Social History   Socioeconomic History  . Marital status: Widowed    Spouse name: Not on file  . Number of children: Not on file  . Years of education: Not on file  . Highest education level: Not on file  Social Needs  . Financial resource strain:  Not on file  . Food insecurity - worry: Not on file  . Food insecurity - inability: Not on file  . Transportation needs - medical: Not on file  . Transportation needs - non-medical: Not on file  Occupational History  . Not on file  Tobacco Use  . Smoking status: Never Smoker  . Smokeless tobacco: Never Used  Substance and Sexual Activity  . Alcohol use: No  . Drug use: No  . Sexual activity: No    Birth control/protection: Post-menopausal  Other Topics Concern  . Not on file  Social History Narrative  . Not on file     Review of Systems  All other systems reviewed and are negative.      Objective:   Physical Exam  Constitutional: She appears well-developed and well-nourished.  Cardiovascular: Normal rate and normal heart sounds.  Pulmonary/Chest: Effort normal and breath sounds normal. No respiratory distress. She has no wheezes. She exhibits tenderness. Right breast exhibits no skin change. Left breast exhibits skin change and tenderness.    Musculoskeletal:       Arms: Skin: Rash noted.       Assessment:     CKD (chronic kidney disease), stage IV ([place]) - Plan: BASIC METABOLIC PANEL WITH GFR  Leg swelling      Plan:   Cellulitis has resolved.  Skin tear is healing slowly.  Wound is dressed with a Tegaderm today.  Replace Tegaderm on Thursday and recheck the patient here on Monday.  Slowly diuresing as she has lost 3 pounds.  Not yet at her baseline.  Continue higher dose of torsemide and recheck on Monday.  Recheck BMP today to monitor potassium and renal function.

## 2017-01-23 ENCOUNTER — Other Ambulatory Visit: Payer: Self-pay | Admitting: Family Medicine

## 2017-01-23 ENCOUNTER — Ambulatory Visit: Payer: Medicare HMO | Admitting: Family Medicine

## 2017-01-23 ENCOUNTER — Encounter: Payer: Self-pay | Admitting: Family Medicine

## 2017-01-23 ENCOUNTER — Other Ambulatory Visit: Payer: Self-pay

## 2017-01-23 VITALS — BP 116/64 | HR 80 | Temp 97.8°F | Resp 18 | Wt 183.0 lb

## 2017-01-23 DIAGNOSIS — I10 Essential (primary) hypertension: Secondary | ICD-10-CM | POA: Diagnosis not present

## 2017-01-23 DIAGNOSIS — M7989 Other specified soft tissue disorders: Secondary | ICD-10-CM

## 2017-01-23 DIAGNOSIS — N183 Chronic kidney disease, stage 3 (moderate): Secondary | ICD-10-CM | POA: Diagnosis not present

## 2017-01-23 DIAGNOSIS — D509 Iron deficiency anemia, unspecified: Secondary | ICD-10-CM | POA: Diagnosis not present

## 2017-01-23 DIAGNOSIS — N184 Chronic kidney disease, stage 4 (severe): Secondary | ICD-10-CM | POA: Diagnosis not present

## 2017-01-23 MED ORDER — METOLAZONE 5 MG PO TABS: 5.0000 mg | ORAL_TABLET | Freq: Every day | ORAL | 0 refills | Status: DC

## 2017-01-23 NOTE — Progress Notes (Signed)
Subjective:     Patient ID: Claudia [name], female   DOB: 24-Dec-1931, 83 y.o.   MRN: [id number]  HPI  01/10/17 Patient has a rash under her left breast.  Rashes in the intertriginous fold.  It appears to be Candida intertrigo.  He has a bright red patch of skin directly over top of the skin fold.  There are satellite patches of erythema.  It runs the length of the breast.  However the surrounding skin has become erythematous warm and painful and appears to be secondarily infected.  She also recently fell and suffered a skin tear over her right lateral epicondyle.  Her son tried to dress the wound as best as he could.  There is a 4 inch x 1 inch skin tear over the lateral epicondyle that is actively bleeding today after the dressing was removed.  The skin around this is also becoming pink warm and tender.  The patient also appears to be slightly more confused than her baseline.  At that time, my plan was:    I believe the patient has Candida intertrigo under the left breast and that the rash has subsequently become secondarily infected.  I will treat the Candida intertrigo and with Lotrisone cream applied twice daily for 7-14 days.  I will treat the secondary cellulitis under the left breast and also around the right elbow with Keflex 500 mg p.o. 3 times daily for 7 days.  I will dress the skin tear with Tegaderm and wrapped it with nonadherent gauze and Coban.  Recheck the wound in 48 hours.  01/13/17 The rash under her left breast already looks much better despite only being on the antibiotics for Lotrisone for 3 days.  The erythema is also fading from around her right elbow although she still has a very large skin tear.  I redressed the skin tear today.  I removed the Tegaderm.  I took 2 nonadherent gauze.  I covered them with Silvadene cream.  I applied them to the skin tear over the lateral epicondyle and gently wrap the entire elbow with co-band.  I would recheck this on Monday and finish her  antibiotics.  However she has +1 pitting edema increasing to +2 pitting edema over the last few days in both legs.  This extends up to her knees and she is also having some more shortness of breath.  She has a history of chronic kidney disease as well as congestive heart failure.  She is currently on torsemide 60 mg a day.  At that time, my plan was:   The skin tear is redressed in the office today as described in the history of present illness.  Cellulitis under the left breast and intertrigo under the left breast is resolving.  Cellulitis around the right elbow is also resolving.  Her mental status is returning to her baseline.  The dressing needs to be changed on Monday at an office visit.  However the patient appears to be fluid overloaded.  I will temporarily increase the torsemide to 60 mg twice daily.  I will add potassium 20 mEq once daily and recheck the patient's BMP today and again on Monday when I recheck her monitoring closely for dehydration.  01/16/17 Here for follow up.  Redness above right elbow and under left breast has completely subsided.  Cellulitis has resolved.  Skin tear is healing albeit slowly.  I removed the previous dressing and replaced it with a Tegaderm.  She continues to have significant swelling  in both legs but denies any chest pain or shortness of breath.  She has +2 edema in both legs to the knees similar to last week however she has lost 3 pounds since her last visit.  At that time, my plan was: Cellulitis has resolved.  Skin tear is healing slowly.  Wound is dressed with a Tegaderm today.  Replace Tegaderm on Thursday and recheck the patient here on Monday.  Slowly diuresing as she has lost 3 pounds.  Not yet at her baseline.  Continue higher dose of torsemide and recheck on Monday.  Recheck BMP today to monitor potassium and renal function.  01/23/17 Legs are even more swollen than they were last week.  She denies any shortness of breath.  She denies any orthopnea.   However her legs are so heavy and swollen that she has a difficult time even walking coupled with her anemia.  She has +2 pitting edema from the knees to the ankles equal and symmetric bilaterally.  There is no weeping fluid coming from the leg.  There are no venous stasis ulcers. Past Medical History:  Diagnosis Date  . Anemia   . Arthritis   . Cancer (HCC)    hx of skin cancer  . CHF (congestive heart failure) (HCC)    diastolic   . CKD (chronic kidney disease) stage 4, GFR 15-29 ml/min (HCC)   . Coronary artery disease   . Diabetes mellitus    insulin dependent  . GERD (gastroesophageal reflux disease)   . Headache(784.0)    rare migraines  . Hypercholesterolemia   . Hypertension   . Myocardial infarction ([place]) 2013  . Osteoporosis   . Proliferative retinopathy due to DM ([place])   . PUD (peptic ulcer disease)   . S/P colonoscopy Sept 2011   left-sided diverticula, tubular adenoma  . S/P endoscopy Aug 2011   3 superficial gastric ulcers, NSAID-induced  . Shortness of breath   . SIADH (syndrome of inappropriate ADH production) ([place])    Past Surgical History:  Procedure Laterality Date  . APPENDECTOMY    . CARDIAC CATHETERIZATION  01/22/2013  . cardiac stents  07/19/2011  . COLONOSCOPY  12/04/09   normal rectum/left-sided diverticula  . COLONOSCOPY N/A 05/26/2016   Performed by Daneil Dolin, MD at [place]  . CORONARY ANGIOPLASTY  07/2011  . CORONARY ARTERY BYPASS GRAFTING (CABG), on pump, times two, using left internal mammary artery, cryo saphenous vein. N/A 11/08/2013   Performed by Prescott Gum, Collier Salina, MD at [facility] OR  . ESOPHAGOGASTRODUODENOSCOPY  10/16/09   normal without barrett's/three superficial gastric ulcers  . ESOPHAGOGASTRODUODENOSCOPY (EGD) N/A 10/17/2016   Performed by Daneil Dolin, MD at [place]  . EXTENSIVE HEMORRHOIDECTOMY N/A 06/17/2016   Performed by Aviva Signs, MD at [facility]  . EYE SURGERY     cataracts  . INTRAOPERATIVE TRANSESOPHAGEAL  ECHOCARDIOGRAM N/A 11/08/2013   Performed by Ivin Poot, MD at [place]  . LEFT HEART CATHETERIZATION WITH CORONARY ANGIOGRAM N/A 11/05/2013   Performed by Adrian Prows, MD at [facility] CATH LAB  . LEFT HEART CATHETERIZATION WITH CORONARY ANGIOGRAM N/A 06/26/2013   Performed by Adrian Prows, MD at [facility] CATH LAB  . LEFT HEART CATHETERIZATION WITH CORONARY ANGIOGRAM N/A 01/22/2013   Performed by Adrian Prows, MD at [facility] CATH LAB  . LEFT HEART CATHETERIZATION WITH CORONARY ANGIOGRAM N/A 12/04/2012   Performed by Adrian Prows, MD at [facility] CATH LAB  . LEFT HEART CATHETERIZATION WITH CORONARY ANGIOGRAM  N/A 09/03/2012   Performed by Laverda Page, MD at [facility] CATH LAB  . LEFT HEART CATHETERIZATION WITH CORONARY ANGIOGRAM N/A 02/20/2012   Performed by Laverda Page, MD at [facility] CATH LAB  . LEFT HEART CATHETERIZATION WITH CORONARY ANGIOGRAM N/A 12/21/2011   Performed by Laverda Page, MD at [facility] CATH LAB  . LEFT HEART CATHETERIZATION WITH CORONARY ANGIOGRAM N/A 07/19/2011   Performed by Laverda Page, MD at [facility] CATH LAB  . MITRAL VALVE (MV) REPLACEMENT N/A 11/08/2013   Performed by Ivin Poot, MD at [place]  . PERCUTANEOUS CORONARY STENT INTERVENTION (PCI-S)  09/03/2012   Performed by Laverda Page, MD at [facility] CATH LAB  . TOE AMPUTATION Left 2013   left second toe amputation  . TONSILLECTOMY     Current Outpatient Medications on File Prior to Visit  Medication Sig Dispense Refill  . ACCU-CHEK FASTCLIX LANCETS MISC USE  TO TEST BLOOD SUGAR FOUR TIMES DAILY FASTING, BEFORE MEALS  AND AT BEDTIME DUE TO FLUCTUATING BLOOD SUGARS 408 each 3  . ACCU-CHEK SMARTVIEW test strip USE  TO TEST BLOOD SUGAR FOUR TIMES DAILY  FASTING, BEFORE MEALS, AND AT BEDTIME DUE TO FLUCTUATING BLOOD SUGARS 400 each 3  . aspirin EC 81 MG tablet Take 81 mg daily by mouth.    . B-D ULTRAFINE III SHORT PEN 31G X 8 MM MISC USE  TO INJECT  INSULIN  5  TIMES  DAILY 450 each 3  . cephALEXin (KEFLEX) 500 MG capsule Take 1 capsule (500 mg  total) 3 (three) times daily by mouth. 21 capsule 0  . cetirizine (ZYRTEC) 10 MG tablet Take 1 tablet (10 mg total) by mouth at bedtime. 30 tablet 11  . Cholecalciferol (VITAMIN D3) 50000 units CAPS Take 1 capsule once a week by mouth.    . clotrimazole-betamethasone (LOTRISONE) cream Apply 1 application 2 (two) times daily topically. 30 g 0  . doxazosin (CARDURA) 4 MG tablet TAKE 1 TABLET EVERY DAY (DISCONTINUE DOXAZOSIN 2MG ) 90 tablet 3  . fluticasone (FLONASE) 50 MCG/ACT nasal spray USE 1 SPRAY IN EACH NOSTRIL EVERY DAY (Patient taking differently: USE 1 SPRAY IN EACH NOSTRIL EVERY DAY AS NEEDED FOR ALLERGIES) 32 g 3  . insulin aspart (NOVOLOG FLEXPEN) 100 UNIT/ML FlexPen 70-120=0    251-300=5 121-150=1  301-350=7 151-200=2  351-400=9 201-250=3  400 or >, call MD  units of insulin (Patient taking differently: Inject 1-5 Units into the skin 3 (three) times daily as needed. 70-120=0    251-300=5 121-150=1  301-350=7 151-200=2  351-400=9 201-250=3  400 or >, call MD  units of insulin) 45 mL 3  . LANTUS SOLOSTAR 100 UNIT/ML Solostar Pen INJECT 20 UNITS SUBCUTANEOUSLY TWICE DAILY (Patient taking differently: INJECT 20 UNITS SUBCUTANEOUSLY IN THE MORNING) 45 mL 3  . losartan (COZAAR) 25 MG tablet Take 25 mg by mouth daily.    . nitroGLYCERIN (NITROSTAT) 0.4 MG SL tablet Place 1 tablet (0.4 mg total) under the tongue every 5 (five) minutes as needed for chest pain. 25 tablet 2  . pantoprazole (PROTONIX) 40 MG tablet TAKE 1 TABLET EVERY DAY 90 tablet 1  . polyethylene glycol (MIRALAX / GLYCOLAX) packet Take 17 g by mouth daily.    . potassium chloride SA (K-DUR,KLOR-CON) 20 MEQ tablet Take 1 tablet (20 mEq total) daily by mouth. 30 tablet 3  . simvastatin (ZOCOR) 20 MG tablet TAKE 1 TABLET EVERY DAY 90 tablet 1  . sodium bicarbonate 650 MG tablet 1  tab po tid  3  . torsemide (DEMADEX) 20 MG tablet TAKE 1 TABLET TWICE DAILY (Patient taking differently: 3 tabs po bid) 180 tablet 3  . traMADol (ULTRAM) 50 MG  tablet TAKE 1 TABLET BY MOUTH EVERY 6 HOURS AS NEEDED FOR PAIN 60 tablet 0  . traZODone (DESYREL) 50 MG tablet TAKE 1 TABLET AT BEDTIME 90 tablet 1   No current facility-administered medications on file prior to visit.    Allergies  Allergen Reactions  . Aspirin Other (See Comments)    High doses caused stomach bleeds   Social History   Socioeconomic History  . Marital status: Widowed    Spouse name: Not on file  . Number of children: Not on file  . Years of education: Not on file  . Highest education level: Not on file  Social Needs  . Financial resource strain: Not on file  . Food insecurity - worry: Not on file  . Food insecurity - inability: Not on file  . Transportation needs - medical: Not on file  . Transportation needs - non-medical: Not on file  Occupational History  . Not on file  Tobacco Use  . Smoking status: Never Smoker  . Smokeless tobacco: Never Used  Substance and Sexual Activity  . Alcohol use: No  . Drug use: No  . Sexual activity: No    Birth control/protection: Post-menopausal  Other Topics Concern  . Not on file  Social History Narrative  . Not on file     Review of Systems  All other systems reviewed and are negative.      Objective:   Physical Exam  Constitutional: She appears well-developed and well-nourished.  Cardiovascular: Normal rate and normal heart sounds.  Pulmonary/Chest: Effort normal and breath sounds normal. No respiratory distress. She has no wheezes. She exhibits tenderness. Right breast exhibits no skin change. Left breast exhibits skin change and tenderness.    Musculoskeletal:       Arms: Skin: Rash noted.       Assessment:     Leg swelling  CKD (chronic kidney disease), stage IV ([place])      Plan:   Patient has gained 3 pounds since her last visit and her legs are tense and swollen.  Legs are too tight to even get compression hose on.  We will try Zaroxolyn 5 mg in the morning 30 minutes before torsemide.   Keep legs elevated.  Reassess on Wednesday.  If the swelling has improved dramatically, I would encourage her son to start putting her in compression hose to prevent reaccumulation of the fluid.  Otherwise consider trying Unna boots.

## 2017-01-25 DIAGNOSIS — N184 Chronic kidney disease, stage 4 (severe): Secondary | ICD-10-CM | POA: Diagnosis not present

## 2017-01-25 DIAGNOSIS — E872 Acidosis: Secondary | ICD-10-CM | POA: Diagnosis not present

## 2017-01-25 DIAGNOSIS — D638 Anemia in other chronic diseases classified elsewhere: Secondary | ICD-10-CM | POA: Diagnosis not present

## 2017-01-30 ENCOUNTER — Encounter (HOSPITAL_COMMUNITY)
Admission: RE | Admit: 2017-01-30 | Discharge: 2017-01-30 | Disposition: A | Discharge status: Home / Self Care | Payer: Medicare HMO | Source: Ambulatory Visit | Attending: Nephrology | Admitting: Nephrology

## 2017-01-30 DIAGNOSIS — D509 Iron deficiency anemia, unspecified: Secondary | ICD-10-CM | POA: Diagnosis not present

## 2017-01-30 LAB — POCT HEMOGLOBIN-HEMACUE: Hemoglobin: 7.6 g/dL — ABNORMAL LOW (ref 12.0–15.0)

## 2017-01-30 MED ORDER — EPOETIN ALFA 3000 UNIT/ML IJ SOLN
INTRAMUSCULAR | Status: AC
  Filled 2017-01-30: qty 1

## 2017-01-30 MED ORDER — EPOETIN ALFA 3000 UNIT/ML IJ SOLN
3000.0000 [IU] | Freq: Once | INTRAMUSCULAR | Status: AC
  Administered 2017-01-30: 3000 [IU] via SUBCUTANEOUS

## 2017-01-30 NOTE — Progress Notes (Signed)
Talked with Claudia [name] at Dr Florentina Addison office. Informed of Hgb 7.6. Procrit 3000 units SQ given. Wanted to make sure Dr Lowanda Foster aware of Hgb level. Stated she would notify him.

## 2017-01-30 NOTE — Progress Notes (Signed)
Results for Claudia [name], Claudia [name] (MRN [id number]) as of 01/30/2017 11:28  Ref. Range 01/30/2017 11:12  Hemoglobin Latest Ref Range: 12.0 - 15.0 g/dL 7.6 (L)

## 2017-02-07 ENCOUNTER — Other Ambulatory Visit: Payer: Self-pay | Admitting: Family Medicine

## 2017-02-07 NOTE — Telephone Encounter (Signed)
Ok to refill 

## 2017-02-07 NOTE — Telephone Encounter (Signed)
ok 

## 2017-02-13 ENCOUNTER — Encounter (HOSPITAL_COMMUNITY): Payer: Medicare HMO

## 2017-02-13 ENCOUNTER — Encounter (HOSPITAL_COMMUNITY)
Admission: RE | Admit: 2017-02-13 | Discharge: 2017-02-13 | Disposition: A | Discharge status: Home / Self Care | Payer: Medicare HMO | Source: Ambulatory Visit | Attending: Nephrology | Admitting: Nephrology

## 2017-02-14 DIAGNOSIS — G4734 Idiopathic sleep related nonobstructive alveolar hypoventilation: Secondary | ICD-10-CM | POA: Diagnosis not present

## 2017-02-22 ENCOUNTER — Ambulatory Visit: Payer: Medicare HMO | Admitting: Nurse Practitioner

## 2017-02-22 ENCOUNTER — Encounter: Payer: Self-pay | Admitting: Nurse Practitioner

## 2017-02-22 VITALS — BP 109/50 | HR 89 | Temp 96.9°F | Ht 67.0 in | Wt 156.8 lb

## 2017-02-22 DIAGNOSIS — R112 Nausea with vomiting, unspecified: Secondary | ICD-10-CM

## 2017-02-22 DIAGNOSIS — K6289 Other specified diseases of anus and rectum: Secondary | ICD-10-CM | POA: Diagnosis not present

## 2017-02-22 DIAGNOSIS — D649 Anemia, unspecified: Secondary | ICD-10-CM

## 2017-02-22 NOTE — Patient Instructions (Signed)
1. Continue to see the kidney doctor. 2. Have your labs drawn with them in 2 weeks. 3. Call if any pain in your rectum, especially if it is associated with having a bowel movement. 4. Let us know if you notice any rectal bleeding. 5. Follow-up in 6 months. 6. Call if you have worsening or recurrent symptoms before then. 7. Call us if you have any questions or concerns.

## 2017-02-22 NOTE — Assessment & Plan Note (Signed)
The patient initially complained of "pain in my bottom."  This is presumed to be rectal pain.  She has a history of significant hemorrhoids with extensive hemorrhoidectomy with some mild hemorrhoids left in place.  Initially thought to be possible hemorrhoid flares.  However, upon further discussion she states she does not have discomfort during a bowel movement or after bowel movement.  This occurs when she is "up and moving around."  Possible musculoskeletal issue.  I have informed her son to let us know if she complains of rectal pain associated with bowel movements which point we can send in Anusol cream.  Continue to monitor symptoms.  Follow-up in 6 months.

## 2017-02-22 NOTE — Assessment & Plan Note (Signed)
History of anemia which is not iron deficient.  Deemed likely due to chronic kidney disease and other comorbidities.  She is seeing nephrology and there is a plan to undergo erythropoietin injections.  She has received 1 so far, the second was canceled due to significant snowfall.  She has a follow-up appointment in about 2 weeks for labs and to see the nephrologist.  At this point I will hold off on labs as she will have some drawn in a couple weeks.  Continue to follow-up with nephrology as recommended.  Call if any noted rectal bleeding.  Return for follow-up in 6 months.

## 2017-02-22 NOTE — Progress Notes (Signed)
Referring Provider: Susy Frizzle, MD Primary Care Physician:  Susy Frizzle, MD Primary GI:  Dr. Gala Romney  Chief Complaint  Patient presents with  . Anemia  . rectal discomfort    HPI:   Claudia [name] is a 83 y.o. female who presents follow-up on anemia.  The patient was last seen in our office 12/06/2016 for anemia, constipation, rectal bleeding.  History of chronic anemia requiring intermittent blood transfusions.  Heme negative on exam, small hematochezia a month prior with hemorrhoid surgery in April 2018.  Peptic ulcer disease 2011.  Anemia seems likely due to chronic disease in the setting of worsening renal function.  Most recent EGD as outlined below.  Colonoscopy up-to-date.  PCP has referred patient to nephrology for consideration of erythropoietin.  Her last visit she was doing well overall but with poor energy.  Nephrology has been seen and is reviewing her chart to make further decisions.  Ultrasound scheduled for 1016.  Some dark stools on iron but no hematochezia.  Chronic constipation which is controlled well with MiraLAX.  No other GI symptoms.  Recommended CBC, continue iron supplementation, follow-up in 2 months, continue to see nephrology.  His recent CBC which was ordered at her last visit was completed 12/09/2016 which found worsening anemia with hemoglobin of 7.5, normocytic, normochromic.  She was generally not having any worsening clinical signs of anemia.  Ongoing evaluation by nephrology.  EGD completed 10/17/2016 which found normal esophagus, normal stomach, normal duodenum. Felt hemoglobin dropped likely multifactorial in nature resulting from chronic kidney disease and hemorrhoid disease as well as recent surgery for hemorrhoids. Recommended follow-up office visit in 1 month and check a CBC at that time.  Colonoscopy up-to-date completed 05/26/2016 while inpatient which found large amount of circumferential grade 4 hemorrhoids as likely source of  hematochezia, diverticulosis in the sigmoid colon and descending colon, potentially coexisting rectal prolapse.  Recommend surgical evaluation.  She was evaluated by surgeon and underwent extensive hemorrhoidectomy on 06/17/2016.  Excisions of extensive hemorrhoids at the 4:00, 5:00, 6:00, 7:00 positions were undertaken.  Some mild prolapsing hemorrhoids in other areas were left in place.  Today she is accompanied by her son. Today she states she's doing ok overall. Still seeing nephrology. Has received 1 epopoetein injection, second was cancelled due to snow. Last hgb 7.6 on 01/30/17. She has an appointment for 03/08/16 for bloodwork and 03/10/16 for the nephrologist. Denies abdominal pain. Rare incidental nausea, no vomiting. Denies hematochezia. Did have a slight nosebleed a few days ago which self-resolved and hasn't recurred. Has a lot of fatigue. Denies chest pain, dyspnea, syncope, near syncope. Has rectal discomfort "mainly when I move around." Denies rectal pain with bowel movement. Son states every time he describes the pain she describes it differently. Denies any other upper or lower GI symptoms.   Past Medical History:  Diagnosis Date  . Anemia   . Arthritis   . Cancer (HCC)    hx of skin cancer  . CHF (congestive heart failure) (HCC)    diastolic   . CKD (chronic kidney disease) stage 4, GFR 15-29 ml/min (HCC)   . Coronary artery disease   . Diabetes mellitus    insulin dependent  . GERD (gastroesophageal reflux disease)   . Headache(784.0)    rare migraines  . Hypercholesterolemia   . Hypertension   . Myocardial infarction ([place]) 2013  . Osteoporosis   . Proliferative retinopathy due to DM ([place])   . PUD (peptic ulcer  disease)   . S/P colonoscopy Sept 2011   left-sided diverticula, tubular adenoma  . S/P endoscopy Aug 2011   3 superficial gastric ulcers, NSAID-induced  . Shortness of breath   . SIADH (syndrome of inappropriate ADH production) ([place])     Past Surgical  History:  Procedure Laterality Date  . APPENDECTOMY    . CARDIAC CATHETERIZATION  01/22/2013  . cardiac stents  07/19/2011  . COLONOSCOPY  12/04/09   normal rectum/left-sided diverticula  . COLONOSCOPY N/A 05/26/2016   Dr. Gala Romney: grade 4 hemorrhoids likely source of hematochezia, diverticulosis in sigmoid and descending colon, element of rectal prolapse not excluded.   . CORONARY ANGIOPLASTY  07/2011  . CORONARY ARTERY BYPASS GRAFT N/A 11/08/2013   Procedure: CORONARY ARTERY BYPASS GRAFTING (CABG), on pump, times two, using left internal mammary artery, cryo saphenous vein.;  Surgeon: Ivin Poot, MD;  Location: [facility];  Service: Open Heart Surgery;  Laterality: N/A;  LIMA-LAD CRYOVEIN -OM  . ESOPHAGOGASTRODUODENOSCOPY  10/16/09   normal without barrett's/three superficial gastric ulcers  . ESOPHAGOGASTRODUODENOSCOPY N/A 10/17/2016   Procedure: ESOPHAGOGASTRODUODENOSCOPY (EGD);  Surgeon: Daneil Dolin, MD;  [facility];  Service: Endoscopy;  Laterality: N/A;  1:30pm  . EYE SURGERY     cataracts  . HEMORRHOID SURGERY N/A 06/17/2016   Procedure: EXTENSIVE HEMORRHOIDECTOMY;  Surgeon: Aviva Signs, MD;  Location: [facility];  Service: General;  Laterality: N/A;  . INTRAOPERATIVE TRANSESOPHAGEAL ECHOCARDIOGRAM N/A 11/08/2013   Procedure: INTRAOPERATIVE TRANSESOPHAGEAL ECHOCARDIOGRAM;  Surgeon: Ivin Poot, MD;  Location: [facility];  Service: Open Heart Surgery;  Laterality: N/A;  . LEFT HEART CATHETERIZATION WITH CORONARY ANGIOGRAM N/A 07/19/2011   Procedure: LEFT HEART CATHETERIZATION WITH CORONARY ANGIOGRAM;  Surgeon: Laverda Page, MD;  Location: [facility] CATH LAB;  Service: Cardiovascular;  Laterality: N/A;  . LEFT HEART CATHETERIZATION WITH CORONARY ANGIOGRAM N/A 12/21/2011   Procedure: LEFT HEART CATHETERIZATION WITH CORONARY ANGIOGRAM;  Surgeon: Laverda Page, MD;  Location: [facility] CATH LAB;  Service: Cardiovascular;  Laterality: N/A;  . LEFT HEART CATHETERIZATION WITH CORONARY ANGIOGRAM  N/A 02/20/2012   Procedure: LEFT HEART CATHETERIZATION WITH CORONARY ANGIOGRAM;  Surgeon: Laverda Page, MD;  Location: [facility] CATH LAB;  Service: Cardiovascular;  Laterality: N/A;  . LEFT HEART CATHETERIZATION WITH CORONARY ANGIOGRAM N/A 09/03/2012   Procedure: LEFT HEART CATHETERIZATION WITH CORONARY ANGIOGRAM;  Surgeon: Laverda Page, MD;  Location: [facility] CATH LAB;  Service: Cardiovascular;  Laterality: N/A;  . LEFT HEART CATHETERIZATION WITH CORONARY ANGIOGRAM N/A 12/04/2012   Procedure: LEFT HEART CATHETERIZATION WITH CORONARY ANGIOGRAM;  Surgeon: Laverda Page, MD;  Location: [facility] CATH LAB;  Service: Cardiovascular;  Laterality: N/A;  . LEFT HEART CATHETERIZATION WITH CORONARY ANGIOGRAM N/A 01/22/2013   Procedure: LEFT HEART CATHETERIZATION WITH CORONARY ANGIOGRAM;  Surgeon: Laverda Page, MD;  Location: [facility] CATH LAB;  Service: Cardiovascular;  Laterality: N/A;  . LEFT HEART CATHETERIZATION WITH CORONARY ANGIOGRAM N/A 06/26/2013   Procedure: LEFT HEART CATHETERIZATION WITH CORONARY ANGIOGRAM;  Surgeon: Laverda Page, MD;  Location: [facility] CATH LAB;  Service: Cardiovascular;  Laterality: N/A;  . LEFT HEART CATHETERIZATION WITH CORONARY ANGIOGRAM N/A 11/05/2013   Procedure: LEFT HEART CATHETERIZATION WITH CORONARY ANGIOGRAM;  Surgeon: Laverda Page, MD;  Location: [facility] CATH LAB;  Service: Cardiovascular;  Laterality: N/A;  . MITRAL VALVE REPLACEMENT N/A 11/08/2013   Procedure: MITRAL VALVE (MV) REPLACEMENT;  Surgeon: Ivin Poot, MD;  Location: [facility];  Service: Open Heart Surgery;  Laterality: N/A;  #25 MAGNA MITRAL EASE  .  PERCUTANEOUS CORONARY STENT INTERVENTION (PCI-S)  09/03/2012   Procedure: PERCUTANEOUS CORONARY STENT INTERVENTION (PCI-S);  Surgeon: Laverda Page, MD;  Location: [facility] CATH LAB;  Service: Cardiovascular;;  . TOE AMPUTATION Left 2013   left second toe amputation  . TONSILLECTOMY      Current Outpatient Medications  Medication Sig Dispense Refill  . ACCU-CHEK  FASTCLIX LANCETS MISC USE  TO TEST BLOOD SUGAR FOUR TIMES DAILY FASTING, BEFORE MEALS  AND AT BEDTIME DUE TO FLUCTUATING BLOOD SUGARS 408 each 3  . ACCU-CHEK SMARTVIEW test strip USE  TO TEST BLOOD SUGAR FOUR TIMES DAILY  FASTING, BEFORE MEALS, AND AT BEDTIME DUE TO FLUCTUATING BLOOD SUGARS 400 each 3  . aspirin EC 81 MG tablet Take 81 mg daily by mouth.    . B-D ULTRAFINE III SHORT PEN 31G X 8 MM MISC USE  TO INJECT  INSULIN  5  TIMES  DAILY 450 each 3  . cetirizine (ZYRTEC) 10 MG tablet Take 1 tablet (10 mg total) by mouth at bedtime. 30 tablet 11  . Cholecalciferol (VITAMIN D3) 50000 units CAPS Take 1 capsule once a week by mouth.    . doxazosin (CARDURA) 4 MG tablet TAKE 1 TABLET EVERY DAY (DISCONTINUE DOXAZOSIN 2MG ) 90 tablet 3  . fluticasone (FLONASE) 50 MCG/ACT nasal spray USE 1 SPRAY IN EACH NOSTRIL EVERY DAY (Patient taking differently: USE 1 SPRAY IN EACH NOSTRIL EVERY DAY AS NEEDED FOR ALLERGIES) 32 g 3  . insulin aspart (NOVOLOG FLEXPEN) 100 UNIT/ML FlexPen 70-120=0    251-300=5 121-150=1  301-350=7 151-200=2  351-400=9 201-250=3  400 or >, call MD  units of insulin (Patient taking differently: Inject 1-5 Units into the skin 3 (three) times daily as needed. 70-120=0    251-300=5 121-150=1  301-350=7 151-200=2  351-400=9 201-250=3  400 or >, call MD  units of insulin) 45 mL 3  . LANTUS SOLOSTAR 100 UNIT/ML Solostar Pen INJECT 20 UNITS SUBCUTANEOUSLY TWICE DAILY (Patient taking differently: INJECT 20 UNITS SUBCUTANEOUSLY IN THE MORNING) 45 mL 3  . losartan (COZAAR) 25 MG tablet Take 25 mg by mouth daily.    . metolazone (ZAROXOLYN) 5 MG tablet TAKE 1 TABLET BY MOUTH EVERY DAY AS DIRECTED 30 MINUTES BEFORE TORSEMIDE 90 tablet 0  . nitroGLYCERIN (NITROSTAT) 0.4 MG SL tablet Place 1 tablet (0.4 mg total) under the tongue every 5 (five) minutes as needed for chest pain. 25 tablet 2  . pantoprazole (PROTONIX) 40 MG tablet TAKE 1 TABLET EVERY DAY 90 tablet 1  . polyethylene glycol (MIRALAX /  GLYCOLAX) packet Take 17 g by mouth daily as needed.     . simvastatin (ZOCOR) 20 MG tablet TAKE 1 TABLET EVERY DAY 90 tablet 1  . sodium bicarbonate 650 MG tablet 1 tab po tid  3  . torsemide (DEMADEX) 20 MG tablet TAKE 1 TABLET TWICE DAILY (Patient taking differently: 3 tabs po bid. Takes 2 tabs po bid) 180 tablet 3  . traMADol (ULTRAM) 50 MG tablet TAKE 1 TABLET BY MOUTH EVERY 6 HOURS AS NEEDED FOR PAIN 60 tablet 1  . traZODone (DESYREL) 50 MG tablet TAKE 1 TABLET AT BEDTIME 90 tablet 1   No current facility-administered medications for this visit.     Allergies as of 02/22/2017 - Review Complete 02/22/2017  Allergen Reaction Noted  . Aspirin Other (See Comments) 10/06/2010    Family History  Problem Relation Age of Onset  . Colon cancer Neg Hx     Social History   Socioeconomic  History  . Marital status: Widowed    Spouse name: None  . Number of children: None  . Years of education: None  . Highest education level: None  Social Needs  . Financial resource strain: None  . Food insecurity - worry: None  . Food insecurity - inability: None  . Transportation needs - medical: None  . Transportation needs - non-medical: None  Occupational History  . None  Tobacco Use  . Smoking status: Never Smoker  . Smokeless tobacco: Never Used  Substance and Sexual Activity  . Alcohol use: No  . Drug use: No  . Sexual activity: No    Birth control/protection: Post-menopausal  Other Topics Concern  . None  Social History Narrative  . None    Review of Systems: General: Negative for anorexia, weight loss, fever, chills. Admits fatigue ENT: Negative for hoarseness, difficulty swallowing. Recent self-limited nose bleed, no recurrence. CV: Negative for chest pain, angina, palpitations, dyspnea on exertion, peripheral edema.  Respiratory: Negative for dyspnea at rest, dyspnea on exertion, cough, sputum, wheezing.  GI: See history of present illness. Derm: Negative for rash or  itching.  Neuro: Negative for weakness, abnormal sensation, seizure, frequent headaches, memory loss, confusion.  Psych: Negative for anxiety, depression, suicidal ideation, hallucinations.  Endo: Negative for unusual weight change.  Heme: Negative for bruising or bleeding.   Physical Exam: BP (!) 109/50   Pulse 89   Temp (!) 96.9 F (36.1 C) (Oral)   Ht 5\' 7"  (1.702 m)   Wt 156 lb 12.8 oz (71.1 kg)   BMI 24.56 kg/m  General:   Alert and oriented. Pleasant and cooperative. Well-nourished and well-developed.  Eyes:  Without icterus, sclera clear and conjunctiva pink.  Ears:  Normal auditory acuity. Cardiovascular:  S1, S2 present without murmurs appreciated. Normal pulses noted. Extremities without clubbing or edema. Respiratory:  Clear to auscultation bilaterally. No wheezes, rales, or rhonchi. No distress.  Gastrointestinal:  +BS, soft, non-tender and non-distended. No HSM noted. No guarding or rebound. No masses appreciated.  Rectal:  Deferred  Musculoskalatal:  Symmetrical without gross deformities. Neurologic:  Alert and oriented x4;  grossly normal neurologically. Psych:  Alert and cooperative. Normal mood and affect. Heme/Lymph/Immune: No excessive bruising noted.    02/22/2017 2:43 PM   Disclaimer: This note was dictated with voice recognition software. Similar sounding words can inadvertently be transcribed and may not be corrected upon review.

## 2017-02-22 NOTE — Progress Notes (Signed)
cc'ed to pcp °

## 2017-02-22 NOTE — Assessment & Plan Note (Signed)
Nausea and vomiting significantly improved.  Her son states rare nausea, no vomiting.  He does not persistent it is probably due to dietary indiscretion.  It is self-limiting.  Call and notify us of any worsening symptoms.  Continue to monitor.  Return for follow-up in 6 months.

## 2017-03-06 ENCOUNTER — Other Ambulatory Visit: Payer: Self-pay | Admitting: Family Medicine

## 2017-03-09 ENCOUNTER — Ambulatory Visit (INDEPENDENT_AMBULATORY_CARE_PROVIDER_SITE_OTHER): Payer: Medicare HMO | Admitting: Family Medicine

## 2017-03-09 ENCOUNTER — Encounter: Payer: Self-pay | Admitting: Family Medicine

## 2017-03-09 VITALS — BP 108/48 | HR 102 | Temp 97.7°F | Resp 20 | Ht 67.0 in | Wt 164.0 lb

## 2017-03-09 DIAGNOSIS — R410 Disorientation, unspecified: Secondary | ICD-10-CM

## 2017-03-09 DIAGNOSIS — R531 Weakness: Secondary | ICD-10-CM

## 2017-03-09 DIAGNOSIS — E161 Other hypoglycemia: Secondary | ICD-10-CM | POA: Diagnosis not present

## 2017-03-09 DIAGNOSIS — Z741 Need for assistance with personal care: Secondary | ICD-10-CM

## 2017-03-09 DIAGNOSIS — N183 Chronic kidney disease, stage 3 (moderate): Secondary | ICD-10-CM | POA: Diagnosis not present

## 2017-03-09 DIAGNOSIS — L89151 Pressure ulcer of sacral region, stage 1: Secondary | ICD-10-CM | POA: Diagnosis not present

## 2017-03-09 DIAGNOSIS — D509 Iron deficiency anemia, unspecified: Secondary | ICD-10-CM | POA: Diagnosis not present

## 2017-03-09 DIAGNOSIS — I10 Essential (primary) hypertension: Secondary | ICD-10-CM | POA: Diagnosis not present

## 2017-03-09 DIAGNOSIS — Z79899 Other long term (current) drug therapy: Secondary | ICD-10-CM | POA: Diagnosis not present

## 2017-03-09 LAB — URINALYSIS, ROUTINE W REFLEX MICROSCOPIC
BILIRUBIN URINE: NEGATIVE
Bacteria, UA: NONE SEEN /HPF
Glucose, UA: NEGATIVE
KETONES UR: NEGATIVE
NITRITE: NEGATIVE
PH: 5.5 (ref 5.0–8.0)
SPECIFIC GRAVITY, URINE: 1.01 (ref 1.001–1.03)

## 2017-03-09 LAB — MICROSCOPIC MESSAGE

## 2017-03-09 MED ORDER — CIPROFLOXACIN HCL 250 MG PO TABS: 250.0000 mg | ORAL_TABLET | Freq: Two times a day (BID) | ORAL | 0 refills | Status: AC

## 2017-03-10 ENCOUNTER — Ambulatory Visit: Payer: Medicare HMO | Admitting: Family Medicine

## 2017-03-10 ENCOUNTER — Other Ambulatory Visit: Payer: Self-pay | Admitting: Family Medicine

## 2017-03-10 DIAGNOSIS — D61818 Other pancytopenia: Secondary | ICD-10-CM

## 2017-03-10 DIAGNOSIS — E871 Hypo-osmolality and hyponatremia: Secondary | ICD-10-CM | POA: Diagnosis not present

## 2017-03-10 DIAGNOSIS — D509 Iron deficiency anemia, unspecified: Secondary | ICD-10-CM | POA: Diagnosis not present

## 2017-03-10 DIAGNOSIS — N184 Chronic kidney disease, stage 4 (severe): Secondary | ICD-10-CM | POA: Diagnosis not present

## 2017-03-10 LAB — URINE CULTURE
MICRO NUMBER:: 90011318
SPECIMEN QUALITY:: ADEQUATE

## 2017-03-10 LAB — COMPLETE METABOLIC PANEL WITH GFR
AG Ratio: 0.7 (calc) — ABNORMAL LOW (ref 1.0–2.5)
ALT: 10 U/L (ref 6–29)
AST: 23 U/L (ref 10–35)
Albumin: 3 g/dL — ABNORMAL LOW (ref 3.6–5.1)
Alkaline phosphatase (APISO): 155 U/L — ABNORMAL HIGH (ref 33–130)
BUN/Creatinine Ratio: 24 (calc) — ABNORMAL HIGH (ref 6–22)
BUN: 63 mg/dL — AB (ref 7–25)
CALCIUM: 8.2 mg/dL — AB (ref 8.6–10.4)
CO2: 19 mmol/L — AB (ref 20–32)
CREATININE: 2.59 mg/dL — AB (ref 0.60–0.88)
Chloride: 97 mmol/L — ABNORMAL LOW (ref 98–110)
GFR, EST NON AFRICAN AMERICAN: 16 mL/min/{1.73_m2} — AB (ref >=60)
GFR, Est African American: 19 mL/min/{1.73_m2} — ABNORMAL LOW (ref >=60)
GLUCOSE: 58 mg/dL — AB (ref 65–99)
Globulin: 4.5 g/dL (calc) — ABNORMAL HIGH (ref 1.9–3.7)
POTASSIUM: 4.5 mmol/L (ref 3.5–5.3)
Sodium: 127 mmol/L — ABNORMAL LOW (ref 135–146)
Total Bilirubin: 1.2 mg/dL (ref 0.2–1.2)
Total Protein: 7.5 g/dL (ref 6.1–8.1)

## 2017-03-10 LAB — CBC WITH DIFFERENTIAL/PLATELET
Basophils Absolute: 20 cells/uL (ref 0–200)
Basophils Relative: 0.8 %
EOS PCT: 0 %
Eosinophils Absolute: 0 cells/uL — ABNORMAL LOW (ref 15–500)
HCT: 22.5 % — ABNORMAL LOW (ref 35.0–45.0)
Hemoglobin: 7.4 g/dL — ABNORMAL LOW (ref 11.7–15.5)
Lymphs Abs: 1170 cells/uL (ref 850–3900)
MCH: 28.2 pg (ref 27.0–33.0)
MCHC: 32.9 g/dL (ref 32.0–36.0)
MCV: 85.9 fL (ref 80.0–100.0)
MPV: 11 fL (ref 7.5–12.5)
Monocytes Relative: 8.1 %
NEUTROS PCT: 44.3 %
Neutro Abs: 1108 cells/uL — ABNORMAL LOW (ref 1500–7800)
PLATELETS: 117 10*3/uL — AB (ref 140–400)
RBC: 2.62 10*6/uL — AB (ref 3.80–5.10)
RDW: 13.3 % (ref 11.0–15.0)
TOTAL LYMPHOCYTE: 46.8 %
WBC mixed population: 203 cells/uL (ref 200–950)
WBC: 2.5 10*3/uL — AB (ref 3.8–10.8)

## 2017-03-10 NOTE — Progress Notes (Signed)
Hem

## 2017-03-10 NOTE — Progress Notes (Signed)
Subjective:     Patient ID: Claudia [name], female   DOB: Nov 18, 1931, 84 y.o.   MRN: [id number]  HPI The patient is here today with her son. Over the last several weeks she has decompensated. She has underlying dementia. However she has drastically worsening confusion. For instance, she is getting lost in her own home. One night, the patient walked into her son's bedroom and started to get and dressed believing she was in the bathroom and preparing to use the toilet. She is also getting increasingly confused regarding day versus night. She woke her son up at 6:30 in the morning upset that he had not fixed her dinner believing it was evening. These events are occurring every day with increasing frequency. She is also becoming increasingly weak and having more difficulty falling. She is having more more difficulty even getting out of bed. She is requiring wheelchair the majority of the time. She's developed a stage I pressure ulcer on her sacrum due to this. She is incontinent of stool and urine. Even today on exam, there is fecal material covering the stage I pressure ulcer on her sacrum. Son also reports increasing shortness of breath. However the patient has lost almost 20 pounds since last time I saw her and appears dehydrated and tachycardic secondary to dehydration with a very soft blood pressure.  Her weight was so low last month, that her kidney doctor decreased her dose of torsemide from 4 tablets twice a day to 1 tablet twice a day. As a result, she has gained 8 pounds back but still appears dehydrated with dry mucous membranes, and abnormalities in her vital signs Wt Readings from Last 3 Encounters:  03/09/17 164 lb (74.4 kg)  02/22/17 156 lb 12.8 oz (71.1 kg)  01/23/17 183 lb (83 kg)    Past Medical History:  Diagnosis Date  . Anemia   . Arthritis   . Cancer (HCC)    hx of skin cancer  . CHF (congestive heart failure) (HCC)    diastolic   . CKD (chronic kidney disease) stage 4, GFR 15-29  ml/min (HCC)   . Coronary artery disease   . Diabetes mellitus    insulin dependent  . GERD (gastroesophageal reflux disease)   . Headache(784.0)    rare migraines  . Hypercholesterolemia   . Hypertension   . Myocardial infarction ([place]) 2013  . Osteoporosis   . Proliferative retinopathy due to DM ([place])   . PUD (peptic ulcer disease)   . S/P colonoscopy Sept 2011   left-sided diverticula, tubular adenoma  . S/P endoscopy Aug 2011   3 superficial gastric ulcers, NSAID-induced  . Shortness of breath   . SIADH (syndrome of inappropriate ADH production) ([place])    Past Surgical History:  Procedure Laterality Date  . APPENDECTOMY    . CARDIAC CATHETERIZATION  01/22/2013  . cardiac stents  07/19/2011  . COLONOSCOPY  12/04/09   normal rectum/left-sided diverticula  . COLONOSCOPY N/A 05/26/2016   Dr. Gala Romney: grade 4 hemorrhoids likely source of hematochezia, diverticulosis in sigmoid and descending colon, element of rectal prolapse not excluded.   . CORONARY ANGIOPLASTY  07/2011  . CORONARY ARTERY BYPASS GRAFT N/A 11/08/2013   Procedure: CORONARY ARTERY BYPASS GRAFTING (CABG), on pump, times two, using left internal mammary artery, cryo saphenous vein.;  Surgeon: Ivin Poot, MD;  Location: [facility];  Service: Open Heart Surgery;  Laterality: N/A;  LIMA-LAD CRYOVEIN -OM  . ESOPHAGOGASTRODUODENOSCOPY  10/16/09   normal without barrett's/three superficial gastric  ulcers  . ESOPHAGOGASTRODUODENOSCOPY N/A 10/17/2016   Procedure: ESOPHAGOGASTRODUODENOSCOPY (EGD);  Surgeon: Daneil Dolin, MD;  [facility];  Service: Endoscopy;  Laterality: N/A;  1:30pm  . EYE SURGERY     cataracts  . HEMORRHOID SURGERY N/A 06/17/2016   Procedure: EXTENSIVE HEMORRHOIDECTOMY;  Surgeon: Aviva Signs, MD;  Location: [facility];  Service: General;  Laterality: N/A;  . INTRAOPERATIVE TRANSESOPHAGEAL ECHOCARDIOGRAM N/A 11/08/2013   Procedure: INTRAOPERATIVE TRANSESOPHAGEAL ECHOCARDIOGRAM;  Surgeon: Ivin Poot,  MD;  Location: [facility];  Service: Open Heart Surgery;  Laterality: N/A;  . LEFT HEART CATHETERIZATION WITH CORONARY ANGIOGRAM N/A 07/19/2011   Procedure: LEFT HEART CATHETERIZATION WITH CORONARY ANGIOGRAM;  Surgeon: Laverda Page, MD;  Location: [facility] CATH LAB;  Service: Cardiovascular;  Laterality: N/A;  . LEFT HEART CATHETERIZATION WITH CORONARY ANGIOGRAM N/A 12/21/2011   Procedure: LEFT HEART CATHETERIZATION WITH CORONARY ANGIOGRAM;  Surgeon: Laverda Page, MD;  Location: [facility] CATH LAB;  Service: Cardiovascular;  Laterality: N/A;  . LEFT HEART CATHETERIZATION WITH CORONARY ANGIOGRAM N/A 02/20/2012   Procedure: LEFT HEART CATHETERIZATION WITH CORONARY ANGIOGRAM;  Surgeon: Laverda Page, MD;  Location: [facility] CATH LAB;  Service: Cardiovascular;  Laterality: N/A;  . LEFT HEART CATHETERIZATION WITH CORONARY ANGIOGRAM N/A 09/03/2012   Procedure: LEFT HEART CATHETERIZATION WITH CORONARY ANGIOGRAM;  Surgeon: Laverda Page, MD;  Location: [facility] CATH LAB;  Service: Cardiovascular;  Laterality: N/A;  . LEFT HEART CATHETERIZATION WITH CORONARY ANGIOGRAM N/A 12/04/2012   Procedure: LEFT HEART CATHETERIZATION WITH CORONARY ANGIOGRAM;  Surgeon: Laverda Page, MD;  Location: [facility] CATH LAB;  Service: Cardiovascular;  Laterality: N/A;  . LEFT HEART CATHETERIZATION WITH CORONARY ANGIOGRAM N/A 01/22/2013   Procedure: LEFT HEART CATHETERIZATION WITH CORONARY ANGIOGRAM;  Surgeon: Laverda Page, MD;  Location: [facility] CATH LAB;  Service: Cardiovascular;  Laterality: N/A;  . LEFT HEART CATHETERIZATION WITH CORONARY ANGIOGRAM N/A 06/26/2013   Procedure: LEFT HEART CATHETERIZATION WITH CORONARY ANGIOGRAM;  Surgeon: Laverda Page, MD;  Location: [facility] LAB;  Service: Cardiovascular;  Laterality: N/A;  . LEFT HEART CATHETERIZATION WITH CORONARY ANGIOGRAM N/A 11/05/2013   Procedure: LEFT HEART CATHETERIZATION WITH CORONARY ANGIOGRAM;  Surgeon: Laverda Page, MD;  Location: [facility] CATH LAB;  Service: Cardiovascular;   Laterality: N/A;  . MITRAL VALVE REPLACEMENT N/A 11/08/2013   Procedure: MITRAL VALVE (MV) REPLACEMENT;  Surgeon: Ivin Poot, MD;  Location: [place];  Service: Open Heart Surgery;  Laterality: N/A;  #25 MAGNA MITRAL EASE  . PERCUTANEOUS CORONARY STENT INTERVENTION (PCI-S)  09/03/2012   Procedure: PERCUTANEOUS CORONARY STENT INTERVENTION (PCI-S);  Surgeon: Laverda Page, MD;  Location: [facility] CATH LAB;  Service: Cardiovascular;;  . TOE AMPUTATION Left 2013   left second toe amputation  . TONSILLECTOMY     Current Outpatient Medications on File Prior to Visit  Medication Sig Dispense Refill  . ACCU-CHEK FASTCLIX LANCETS MISC USE  TO TEST BLOOD SUGAR FOUR TIMES DAILY FASTING, BEFORE MEALS  AND AT BEDTIME DUE TO FLUCTUATING BLOOD SUGARS 408 each 3  . ACCU-CHEK SMARTVIEW test strip USE  TO TEST BLOOD SUGAR FOUR TIMES DAILY  FASTING, BEFORE MEALS, AND AT BEDTIME DUE TO FLUCTUATING BLOOD SUGARS 400 each 3  . aspirin EC 81 MG tablet Take 81 mg daily by mouth.    . B-D ULTRAFINE III SHORT PEN 31G X 8 MM MISC USE  TO INJECT  INSULIN  5  TIMES  DAILY 450 each 3  . cetirizine (ZYRTEC) 10 MG tablet Take 1 tablet (10 mg  total) by mouth at bedtime. 30 tablet 11  . Cholecalciferol (VITAMIN D3) 50000 units CAPS Take 1 capsule once a week by mouth.    . doxazosin (CARDURA) 4 MG tablet TAKE 1 TABLET EVERY DAY (DISCONTINUE DOXAZOSIN 2MG ) 90 tablet 3  . fluticasone (FLONASE) 50 MCG/ACT nasal spray USE 1 SPRAY IN EACH NOSTRIL EVERY DAY (Patient taking differently: USE 1 SPRAY IN EACH NOSTRIL EVERY DAY AS NEEDED FOR ALLERGIES) 32 g 3  . insulin aspart (NOVOLOG FLEXPEN) 100 UNIT/ML FlexPen 70-120=0    251-300=5 121-150=1  301-350=7 151-200=2  351-400=9 201-250=3  400 or >, call MD  units of insulin (Patient taking differently: Inject 1-5 Units into the skin 3 (three) times daily as needed. 70-120=0    251-300=5 121-150=1  301-350=7 151-200=2  351-400=9 201-250=3  400 or >, call MD  units of insulin) 45 mL 3  . LANTUS  SOLOSTAR 100 UNIT/ML Solostar Pen INJECT 20 UNITS SUBCUTANEOUSLY TWICE DAILY (Patient taking differently: INJECT 20 UNITS SUBCUTANEOUSLY IN THE MORNING) 45 mL 3  . losartan (COZAAR) 25 MG tablet Take 25 mg by mouth daily.    . metolazone (ZAROXOLYN) 5 MG tablet TAKE 1 TABLET BY MOUTH EVERY DAY AS DIRECTED 30 MINUTES BEFORE TORSEMIDE 90 tablet 0  . nitroGLYCERIN (NITROSTAT) 0.4 MG SL tablet Place 1 tablet (0.4 mg total) under the tongue every 5 (five) minutes as needed for chest pain. 25 tablet 2  . pantoprazole (PROTONIX) 40 MG tablet TAKE 1 TABLET EVERY DAY 90 tablet 1  . polyethylene glycol (MIRALAX / GLYCOLAX) packet Take 17 g by mouth daily as needed.     . simvastatin (ZOCOR) 20 MG tablet TAKE 1 TABLET EVERY DAY 90 tablet 1  . sodium bicarbonate 650 MG tablet 1 tab po tid  3  . torsemide (DEMADEX) 20 MG tablet TAKE 1 TABLET TWICE DAILY 180 tablet 3  . traMADol (ULTRAM) 50 MG tablet TAKE 1 TABLET BY MOUTH EVERY 6 HOURS AS NEEDED FOR PAIN 60 tablet 1  . traZODone (DESYREL) 50 MG tablet TAKE 1 TABLET AT BEDTIME 90 tablet 1   No current facility-administered medications on file prior to visit.    Allergies  Allergen Reactions  . Aspirin Other (See Comments)    High doses caused stomach bleeds   Social History   Socioeconomic History  . Marital status: Widowed    Spouse name: Not on file  . Number of children: Not on file  . Years of education: Not on file  . Highest education level: Not on file  Social Needs  . Financial resource strain: Not on file  . Food insecurity - worry: Not on file  . Food insecurity - inability: Not on file  . Transportation needs - medical: Not on file  . Transportation needs - non-medical: Not on file  Occupational History  . Not on file  Tobacco Use  . Smoking status: Never Smoker  . Smokeless tobacco: Never Used  Substance and Sexual Activity  . Alcohol use: No  . Drug use: No  . Sexual activity: No    Birth control/protection: Post-menopausal    Other Topics Concern  . Not on file  Social History Narrative  . Not on file     Review of Systems  All other systems reviewed and are negative.      Objective:   Physical Exam  Constitutional: She appears well-developed and well-nourished.  Cardiovascular: Normal rate and normal heart sounds.  Pulmonary/Chest: Effort normal and breath sounds normal. No  respiratory distress. She has no wheezes. She has no rales. She exhibits no tenderness.  Abdominal: Soft. Bowel sounds are normal. She exhibits no distension and no mass. There is no tenderness. There is no rebound and no guarding.  Skin: Rash noted.          Assessment:     Delirium - Plan: Urinalysis, Routine w reflex microscopic, CBC with Differential/Platelet, COMPLETE METABOLIC PANEL WITH GFR, Urine Culture  Weakness  Pressure injury of sacral region, stage 1  Requires assistance with activities of daily living (ADL)      Plan:        There is no evidence of pneumonia today on her exam. There is no evidence of overt cellulitis or infection of a stage I pressure ulcer. Urinalysis however does show blood and leukocyte esterase concerning for a possible urinary tract infection which may account for the decompensation in her dementia and delirium. Therefore I will treat the patient with Cipro 250 mg by mouth twice a day for 5 days. Also believe that she is dehydrated. I have encouraged increased fluid intake. I will make no changes in her diuretic dose however, she does have +1 pitting edema in both legs and appears to be third spacing. However I will discontinue her Cardura due to relative hypotension which may be contributing to her falls and her balance issues. I believe she needs a home health consultation for physical therapy, assistance around the home, wound care regarding her stage I pressure ulcer. I believe she would also benefit from a hospital bed due to the fact she is developing pressure ulcers which have a high  risk of infection secondary to her fecal incontinence. Check CBC and CMP as well. Consult home health. Recheck on Monday. Go to the emergency room if worsening

## 2017-03-11 DIAGNOSIS — F0391 Unspecified dementia with behavioral disturbance: Secondary | ICD-10-CM | POA: Diagnosis not present

## 2017-03-11 DIAGNOSIS — Z794 Long term (current) use of insulin: Secondary | ICD-10-CM | POA: Diagnosis not present

## 2017-03-11 DIAGNOSIS — L89322 Pressure ulcer of left buttock, stage 2: Secondary | ICD-10-CM | POA: Diagnosis not present

## 2017-03-11 DIAGNOSIS — N390 Urinary tract infection, site not specified: Secondary | ICD-10-CM | POA: Diagnosis not present

## 2017-03-11 DIAGNOSIS — B9689 Other specified bacterial agents as the cause of diseases classified elsewhere: Secondary | ICD-10-CM | POA: Diagnosis not present

## 2017-03-11 DIAGNOSIS — M6281 Muscle weakness (generalized): Secondary | ICD-10-CM | POA: Diagnosis not present

## 2017-03-11 DIAGNOSIS — L89152 Pressure ulcer of sacral region, stage 2: Secondary | ICD-10-CM | POA: Diagnosis not present

## 2017-03-11 DIAGNOSIS — E119 Type 2 diabetes mellitus without complications: Secondary | ICD-10-CM | POA: Diagnosis not present

## 2017-03-13 ENCOUNTER — Encounter: Payer: Self-pay | Admitting: Family Medicine

## 2017-03-13 ENCOUNTER — Ambulatory Visit (INDEPENDENT_AMBULATORY_CARE_PROVIDER_SITE_OTHER): Payer: Medicare HMO | Admitting: Family Medicine

## 2017-03-13 VITALS — BP 126/62 | HR 86 | Temp 97.7°F | Resp 16 | Ht 67.0 in | Wt 161.0 lb

## 2017-03-13 DIAGNOSIS — R410 Disorientation, unspecified: Secondary | ICD-10-CM | POA: Diagnosis not present

## 2017-03-13 DIAGNOSIS — D631 Anemia in chronic kidney disease: Secondary | ICD-10-CM | POA: Diagnosis not present

## 2017-03-13 DIAGNOSIS — R531 Weakness: Secondary | ICD-10-CM

## 2017-03-13 DIAGNOSIS — D61818 Other pancytopenia: Secondary | ICD-10-CM

## 2017-03-13 DIAGNOSIS — Z711 Person with feared health complaint in whom no diagnosis is made: Secondary | ICD-10-CM

## 2017-03-13 DIAGNOSIS — N184 Chronic kidney disease, stage 4 (severe): Secondary | ICD-10-CM

## 2017-03-13 MED ORDER — HYDROCODONE-ACETAMINOPHEN 5-325 MG PO TABS: 1.0000 | ORAL_TABLET | Freq: Four times a day (QID) | ORAL | 0 refills | Status: DC | PRN

## 2017-03-13 MED ORDER — INSULIN ASPART 100 UNIT/ML FLEXPEN: 1.0000 [IU] | PEN_INJECTOR | Freq: Three times a day (TID) | SUBCUTANEOUS | 3 refills | Status: DC | PRN

## 2017-03-13 MED ORDER — INSULIN ASPART 100 UNIT/ML FLEXPEN: 1.0000 [IU] | PEN_INJECTOR | Freq: Three times a day (TID) | SUBCUTANEOUS | 3 refills | Status: AC | PRN

## 2017-03-14 ENCOUNTER — Encounter (HOSPITAL_COMMUNITY)
Admission: RE | Admit: 2017-03-14 | Discharge: 2017-03-14 | Disposition: A | Discharge status: Home / Self Care | Payer: Medicare HMO | Source: Ambulatory Visit | Attending: Nephrology | Admitting: Nephrology

## 2017-03-14 DIAGNOSIS — D631 Anemia in chronic kidney disease: Secondary | ICD-10-CM | POA: Insufficient documentation

## 2017-03-14 DIAGNOSIS — N184 Chronic kidney disease, stage 4 (severe): Secondary | ICD-10-CM | POA: Insufficient documentation

## 2017-03-14 LAB — POCT HEMOGLOBIN-HEMACUE: HEMOGLOBIN: 8.2 g/dL — AB (ref 12.0–15.0)

## 2017-03-14 MED ORDER — EPOETIN ALFA 3000 UNIT/ML IJ SOLN
3000.0000 [IU] | Freq: Once | INTRAMUSCULAR | Status: AC
  Administered 2017-03-14: 3000 [IU] via SUBCUTANEOUS

## 2017-03-14 MED ORDER — EPOETIN ALFA 3000 UNIT/ML IJ SOLN
INTRAMUSCULAR | Status: AC
  Filled 2017-03-14: qty 1

## 2017-03-14 NOTE — Progress Notes (Signed)
Subjective:     Patient ID: Claudia [name], female   DOB: 1931/08/01, 84 y.o.   MRN: [id number]  HPI  03/09/17 The patient is here today with her son. Over the last several weeks she has decompensated. She has underlying dementia. However she has drastically worsening confusion. For instance, she is getting lost in her own home. One night, the patient walked into her son's bedroom and started to get and dressed believing she was in the bathroom and preparing to use the toilet. She is also getting increasingly confused regarding day versus night. She woke her son up at 6:30 in the morning upset that he had not fixed her dinner believing it was evening. These events are occurring every day with increasing frequency. She is also becoming increasingly weak and having more difficulty falling. She is having more more difficulty even getting out of bed. She is requiring wheelchair the majority of the time. She's developed a stage I pressure ulcer on her sacrum due to this. She is incontinent of stool and urine. Even today on exam, there is fecal material covering the stage I pressure ulcer on her sacrum. Son also reports increasing shortness of breath. However the patient has lost almost 20 pounds since last time I saw her and appears dehydrated and tachycardic secondary to dehydration with a very soft blood pressure.  Her weight was so low last month, that her kidney doctor decreased her dose of torsemide from 4 tablets twice a day to 1 tablet twice a day. As a result, she has gained 8 pounds back but still appears dehydrated with dry mucous membranes, and abnormalities in her vital signs.  At that time, my plan was: There is no evidence of pneumonia today on her exam. There is no evidence of overt cellulitis or infection of a stage I pressure ulcer. Urinalysis however does show blood and leukocyte esterase concerning for a possible urinary tract infection which may account for the decompensation in her dementia and  delirium. Therefore I will treat the patient with Cipro 250 mg by mouth twice a day for 5 days. Also believe that she is dehydrated. I have encouraged increased fluid intake. I will make no changes in her diuretic dose however, she does have +1 pitting edema in both legs and appears to be third spacing. However I will discontinue her Cardura due to relative hypotension which may be contributing to her falls and her balance issues. I believe she needs a home health consultation for physical therapy, assistance around the home, wound care regarding her stage I pressure ulcer. I believe she would also benefit from a hospital bed due to the fact she is developing pressure ulcers which have a high risk of infection secondary to her fecal incontinence. Check CBC and CMP as well. Consult home health. Recheck on Monday. Go to the emergency room if worsening   Wt Readings from Last 3 Encounters:  03/13/17 161 lb (73 kg)  03/09/17 164 lb (74.4 kg)  02/22/17 156 lb 12.8 oz (71.1 kg)   03/14/17 Urine culture revealed only contamination. Exam shows no evidence of a reversible cause of delirium. There is no cellulitis. There is no pneumonia. I suspect that the patient has either had a deterioration in her underlying dementia or possibly even a small stroke to explain the deterioration in her mental status. Today she is in no apparent distress. She intermittently answers questions but the majority of the visit she sits quietly with her eyes closed. Son states  that she sleeps the majority of the day. She is still extremely confused at home. Labs show persistence in her stage IV chronic kidney disease but no acute deterioration however patient now has diffuse pancytopenia. Office Visit on 03/09/2017  Component Date Value Ref Range Status  . Color, Urine 03/09/2017 YELLOW  YELLOW Final  . APPearance 03/09/2017 CLOUDY* CLEAR Final  . Specific Gravity, Urine 03/09/2017 1.010  1.001 - 1.03 Final  . pH 03/09/2017 5.5  5.0 -  8.0 Final  . Glucose, UA 03/09/2017 NEGATIVE  NEGATIVE Final  . Bilirubin Urine 03/09/2017 NEGATIVE  NEGATIVE Final  . Ketones, ur 03/09/2017 NEGATIVE  NEGATIVE Final  . Hgb urine dipstick 03/09/2017 2+* NEGATIVE Final  . Protein, ur 03/09/2017 2+* NEGATIVE Final  . Nitrite 03/09/2017 NEGATIVE  NEGATIVE Final  . Leukocytes, UA 03/09/2017 TRACE* NEGATIVE Final  . WBC, UA 03/09/2017 0-5  0 - 5 /HPF Final  . RBC / HPF 03/09/2017 3-10* 0 - 2 /HPF Final  . Squamous Epithelial / LPF 03/09/2017 0-5  < OR = 5 /HPF Final  . Bacteria, UA 03/09/2017 NONE SEEN  NONE SEEN /HPF Final  . WBC 03/09/2017 2.5* 3.8 - 10.8 Thousand/uL Final  . RBC 03/09/2017 2.62* 3.80 - 5.10 Million/uL Final  . Hemoglobin 03/09/2017 7.4* 11.7 - 15.5 g/dL Final  . HCT 03/09/2017 22.5* 35.0 - 45.0 % Final  . MCV 03/09/2017 85.9  80.0 - 100.0 fL Final  . MCH 03/09/2017 28.2  27.0 - 33.0 pg Final  . MCHC 03/09/2017 32.9  32.0 - 36.0 g/dL Final  . RDW 03/09/2017 13.3  11.0 - 15.0 % Final  . Platelets 03/09/2017 117* 140 - 400 Thousand/uL Final  . MPV 03/09/2017 11.0  7.5 - 12.5 fL Final  . Neutro Abs 03/09/2017 1,108* 1,500 - 7,800 cells/uL Final  . Lymphs Abs 03/09/2017 1,170  850 - 3,900 cells/uL Final  . WBC mixed population 03/09/2017 203  200 - 950 cells/uL Final  . Eosinophils Absolute 03/09/2017 0* 15 - 500 cells/uL Final  . Basophils Absolute 03/09/2017 20  0 - 200 cells/uL Final  . Neutrophils Relative % 03/09/2017 44.3  % Final  . Total Lymphocyte 03/09/2017 46.8  % Final  . Monocytes Relative 03/09/2017 8.1  % Final  . Eosinophils Relative 03/09/2017 0.0  % Final  . Basophils Relative 03/09/2017 0.8  % Final  . Glucose, Bld 03/09/2017 58* 65 - 99 mg/dL Final   Comment: .            Fasting reference interval .   . BUN 03/09/2017 63* 7 - 25 mg/dL Final  . Creat 03/09/2017 2.59* 0.60 - 0.88 mg/dL Final   Comment: For patients >33 years of age, the reference limit for Creatinine is approximately 13% higher  for people identified as African-American. .   . GFR, Est Non African American 03/09/2017 16* > OR = 60 mL/min/1.28m2 Final  . GFR, Est African American 03/09/2017 19* > OR = 60 mL/min/1.52m2 Final  . BUN/Creatinine Ratio 03/09/2017 24* 6 - 22 (calc) Final  . Sodium 03/09/2017 127* 135 - 146 mmol/L Final  . Potassium 03/09/2017 4.5  3.5 - 5.3 mmol/L Final  . Chloride 03/09/2017 97* 98 - 110 mmol/L Final  . CO2 03/09/2017 19* 20 - 32 mmol/L Final  . Calcium 03/09/2017 8.2* 8.6 - 10.4 mg/dL Final  . Total Protein 03/09/2017 7.5  6.1 - 8.1 g/dL Final  . Albumin 03/09/2017 3.0* 3.6 - 5.1 g/dL Final  . Globulin 03/09/2017  4.5* 1.9 - 3.7 g/dL (calc) Final  . AG Ratio 03/09/2017 0.7* 1.0 - 2.5 (calc) Final  . Total Bilirubin 03/09/2017 1.2  0.2 - 1.2 mg/dL Final  . Alkaline phosphatase (APISO) 03/09/2017 155* 33 - 130 U/L Final  . AST 03/09/2017 23  10 - 35 U/L Final  . ALT 03/09/2017 10  6 - 29 U/L Final  . MICRO NUMBER: 03/09/2017 [id number]   Final  . SPECIMEN QUALITY: 03/09/2017 ADEQUATE   Final  . Sample Source 03/09/2017 URINE   Final  . STATUS: 03/09/2017 FINAL   Final  . Result: 03/09/2017 Multiple organisms present, each less than 10,000 CFU/mL. These organisms, commonly found on external and internal genitalia, are considered to be colonizers. No further testing performed.   Final  . Note 03/09/2017    Final   Comment: This urine was analyzed for the presence of WBC,  RBC, bacteria, casts, and other formed elements.  Only those elements seen were reported. . .    She continues to show severe anemia despite transfusion, multiple iron infusions, and erythropoietin injections. I suspect the patient has myeloproliferative disorder versus myelofibrosis. Her nephrologist has consulted hematology and she has an appointment for next week. However I spent more than 30 minutes today with the patient and her son discussing end-of-life care Past Medical History:  Diagnosis Date  . Anemia     . Arthritis   . Cancer (HCC)    hx of skin cancer  . CHF (congestive heart failure) (HCC)    diastolic   . CKD (chronic kidney disease) stage 4, GFR 15-29 ml/min (HCC)   . Coronary artery disease   . Diabetes mellitus    insulin dependent  . GERD (gastroesophageal reflux disease)   . Headache(784.0)    rare migraines  . Hypercholesterolemia   . Hypertension   . Myocardial infarction ([place]) 2013  . Osteoporosis   . Proliferative retinopathy due to DM ([place])   . PUD (peptic ulcer disease)   . S/P colonoscopy Sept 2011   left-sided diverticula, tubular adenoma  . S/P endoscopy Aug 2011   3 superficial gastric ulcers, NSAID-induced  . Shortness of breath   . SIADH (syndrome of inappropriate ADH production) ([place])    Past Surgical History:  Procedure Laterality Date  . APPENDECTOMY    . CARDIAC CATHETERIZATION  01/22/2013  . cardiac stents  07/19/2011  . COLONOSCOPY  12/04/09   normal rectum/left-sided diverticula  . COLONOSCOPY N/A 05/26/2016   Dr. Gala Romney: grade 4 hemorrhoids likely source of hematochezia, diverticulosis in sigmoid and descending colon, element of rectal prolapse not excluded.   . CORONARY ANGIOPLASTY  07/2011  . CORONARY ARTERY BYPASS GRAFT N/A 11/08/2013   Procedure: CORONARY ARTERY BYPASS GRAFTING (CABG), on pump, times two, using left internal mammary artery, cryo saphenous vein.;  Surgeon: Ivin Poot, MD;  Location: [facility];  Service: Open Heart Surgery;  Laterality: N/A;  LIMA-LAD CRYOVEIN -OM  . ESOPHAGOGASTRODUODENOSCOPY  10/16/09   normal without barrett's/three superficial gastric ulcers  . ESOPHAGOGASTRODUODENOSCOPY N/A 10/17/2016   Procedure: ESOPHAGOGASTRODUODENOSCOPY (EGD);  Surgeon: Daneil Dolin, MD;  [facility];  Service: Endoscopy;  Laterality: N/A;  1:30pm  . EYE SURGERY     cataracts  . HEMORRHOID SURGERY N/A 06/17/2016   Procedure: EXTENSIVE HEMORRHOIDECTOMY;  Surgeon: Aviva Signs, MD;  Location: [facility];  Service: General;   Laterality: N/A;  . INTRAOPERATIVE TRANSESOPHAGEAL ECHOCARDIOGRAM N/A 11/08/2013   Procedure: INTRAOPERATIVE TRANSESOPHAGEAL ECHOCARDIOGRAM;  Surgeon: Ivin Poot, MD;  Location: [facility];  Service: Open Heart Surgery;  Laterality: N/A;  . LEFT HEART CATHETERIZATION WITH CORONARY ANGIOGRAM N/A 07/19/2011   Procedure: LEFT HEART CATHETERIZATION WITH CORONARY ANGIOGRAM;  Surgeon: Laverda Page, MD;  Location: [facility] CATH LAB;  Service: Cardiovascular;  Laterality: N/A;  . LEFT HEART CATHETERIZATION WITH CORONARY ANGIOGRAM N/A 12/21/2011   Procedure: LEFT HEART CATHETERIZATION WITH CORONARY ANGIOGRAM;  Surgeon: Laverda Page, MD;  Location: [facility] CATH LAB;  Service: Cardiovascular;  Laterality: N/A;  . LEFT HEART CATHETERIZATION WITH CORONARY ANGIOGRAM N/A 02/20/2012   Procedure: LEFT HEART CATHETERIZATION WITH CORONARY ANGIOGRAM;  Surgeon: Laverda Page, MD;  Location: [facility] CATH LAB;  Service: Cardiovascular;  Laterality: N/A;  . LEFT HEART CATHETERIZATION WITH CORONARY ANGIOGRAM N/A 09/03/2012   Procedure: LEFT HEART CATHETERIZATION WITH CORONARY ANGIOGRAM;  Surgeon: Laverda Page, MD;  Location: [facility] CATH LAB;  Service: Cardiovascular;  Laterality: N/A;  . LEFT HEART CATHETERIZATION WITH CORONARY ANGIOGRAM N/A 12/04/2012   Procedure: LEFT HEART CATHETERIZATION WITH CORONARY ANGIOGRAM;  Surgeon: Laverda Page, MD;  Location: [facility] CATH LAB;  Service: Cardiovascular;  Laterality: N/A;  . LEFT HEART CATHETERIZATION WITH CORONARY ANGIOGRAM N/A 01/22/2013   Procedure: LEFT HEART CATHETERIZATION WITH CORONARY ANGIOGRAM;  Surgeon: Laverda Page, MD;  Location: [facility] CATH LAB;  Service: Cardiovascular;  Laterality: N/A;  . LEFT HEART CATHETERIZATION WITH CORONARY ANGIOGRAM N/A 06/26/2013   Procedure: LEFT HEART CATHETERIZATION WITH CORONARY ANGIOGRAM;  Surgeon: Laverda Page, MD;  Location: [facility] CATH LAB;  Service: Cardiovascular;  Laterality: N/A;  . LEFT HEART CATHETERIZATION WITH CORONARY ANGIOGRAM  N/A 11/05/2013   Procedure: LEFT HEART CATHETERIZATION WITH CORONARY ANGIOGRAM;  Surgeon: Laverda Page, MD;  Location: [facility] CATH LAB;  Service: Cardiovascular;  Laterality: N/A;  . MITRAL VALVE REPLACEMENT N/A 11/08/2013   Procedure: MITRAL VALVE (MV) REPLACEMENT;  Surgeon: Ivin Poot, MD;  Location: [place];  Service: Open Heart Surgery;  Laterality: N/A;  #25 MAGNA MITRAL EASE  . PERCUTANEOUS CORONARY STENT INTERVENTION (PCI-S)  09/03/2012   Procedure: PERCUTANEOUS CORONARY STENT INTERVENTION (PCI-S);  Surgeon: Laverda Page, MD;  Location: [facility] CATH LAB;  Service: Cardiovascular;;  . TOE AMPUTATION Left 2013   left second toe amputation  . TONSILLECTOMY     Current Outpatient Medications on File Prior to Visit  Medication Sig Dispense Refill  . ACCU-CHEK FASTCLIX LANCETS MISC USE  TO TEST BLOOD SUGAR FOUR TIMES DAILY FASTING, BEFORE MEALS  AND AT BEDTIME DUE TO FLUCTUATING BLOOD SUGARS 408 each 3  . ACCU-CHEK SMARTVIEW test strip USE  TO TEST BLOOD SUGAR FOUR TIMES DAILY  FASTING, BEFORE MEALS, AND AT BEDTIME DUE TO FLUCTUATING BLOOD SUGARS 400 each 3  . aspirin EC 81 MG tablet Take 81 mg daily by mouth.    . B-D ULTRAFINE III SHORT PEN 31G X 8 MM MISC USE  TO INJECT  INSULIN  5  TIMES  DAILY 450 each 3  . cetirizine (ZYRTEC) 10 MG tablet Take 1 tablet (10 mg total) by mouth at bedtime. 30 tablet 11  . Cholecalciferol (VITAMIN D3) 50000 units CAPS Take 1 capsule once a week by mouth.    . ciprofloxacin (CIPRO) 250 MG tablet Take 1 tablet (250 mg total) by mouth 2 (two) times daily. 10 tablet 0  . fluticasone (FLONASE) 50 MCG/ACT nasal spray USE 1 SPRAY IN EACH NOSTRIL EVERY DAY (Patient taking differently: USE 1 SPRAY IN EACH NOSTRIL EVERY DAY AS NEEDED FOR ALLERGIES) 32 g 3  . LANTUS SOLOSTAR  100 UNIT/ML Solostar Pen INJECT 20 UNITS SUBCUTANEOUSLY TWICE DAILY (Patient taking differently: INJECT 20 UNITS SUBCUTANEOUSLY IN THE MORNING) 45 mL 3  . losartan (COZAAR) 25 MG tablet Take 25 mg by  mouth daily.    . metolazone (ZAROXOLYN) 5 MG tablet TAKE 1 TABLET BY MOUTH EVERY DAY AS DIRECTED 30 MINUTES BEFORE TORSEMIDE 90 tablet 0  . nitroGLYCERIN (NITROSTAT) 0.4 MG SL tablet Place 1 tablet (0.4 mg total) under the tongue every 5 (five) minutes as needed for chest pain. 25 tablet 2  . pantoprazole (PROTONIX) 40 MG tablet TAKE 1 TABLET EVERY DAY 90 tablet 1  . polyethylene glycol (MIRALAX / GLYCOLAX) packet Take 17 g by mouth daily as needed.     . simvastatin (ZOCOR) 20 MG tablet TAKE 1 TABLET EVERY DAY 90 tablet 1  . sodium bicarbonate 650 MG tablet 1 tab po tid  3  . torsemide (DEMADEX) 20 MG tablet TAKE 1 TABLET TWICE DAILY 180 tablet 3  . traMADol (ULTRAM) 50 MG tablet TAKE 1 TABLET BY MOUTH EVERY 6 HOURS AS NEEDED FOR PAIN 60 tablet 1  . traZODone (DESYREL) 50 MG tablet TAKE 1 TABLET AT BEDTIME 90 tablet 1  . doxazosin (CARDURA) 4 MG tablet TAKE 1 TABLET EVERY DAY (DISCONTINUE DOXAZOSIN 2MG ) (Patient not taking: Reported on 03/13/2017) 90 tablet 3   No current facility-administered medications on file prior to visit.    Allergies  Allergen Reactions  . Aspirin Other (See Comments)    High doses caused stomach bleeds   Social History   Socioeconomic History  . Marital status: Widowed    Spouse name: Not on file  . Number of children: Not on file  . Years of education: Not on file  . Highest education level: Not on file  Social Needs  . Financial resource strain: Not on file  . Food insecurity - worry: Not on file  . Food insecurity - inability: Not on file  . Transportation needs - medical: Not on file  . Transportation needs - non-medical: Not on file  Occupational History  . Not on file  Tobacco Use  . Smoking status: Never Smoker  . Smokeless tobacco: Never Used  Substance and Sexual Activity  . Alcohol use: No  . Drug use: No  . Sexual activity: No    Birth control/protection: Post-menopausal  Other Topics Concern  . Not on file  Social History Narrative    . Not on file     Review of Systems  All other systems reviewed and are negative.      Objective:   Physical Exam  Constitutional: She appears well-developed and well-nourished.  Cardiovascular: Normal rate and normal heart sounds.  Pulmonary/Chest: Effort normal and breath sounds normal. No respiratory distress. She has no wheezes. She has no rales. She exhibits no tenderness.  Abdominal: Soft. Bowel sounds are normal. She exhibits no distension and no mass. There is no tenderness. There is no rebound and no guarding.  Skin: Rash noted.          Assessment:     Delirium - Plan: Ambulatory referral to Hospice  CKD (chronic kidney disease), stage IV ([place]) - Plan: Ambulatory referral to Hospice  Weakness - Plan: Ambulatory referral to Hospice  Anemia in stage 4 chronic kidney disease ([place])  Pancytopenia ([place]) - Plan: Ambulatory referral to Hospice  Concern about end of life - Plan: Ambulatory referral to Hospice      Plan:  I believe the patient is nearing end-of-life. She has a very complicated past medical history including heart failure, stage IV chronic kidney disease, dementia, severe anemia refractory to iron transfusions and erythropoietin injections, and now pancytopenia. Her dementia is worsening. After a long discussion with her son, he too is to avoid aggressive interventions simply to prolong life but would rather focus on quality of life as well as patient comfort. He states that the tramadol is no longer using her pain. Whenever he has to move the patient, she moans and cries. He is currently using tramadol 3 times a day but is interested in possibly using something stronger to help ease her pain and keep her more comfortable. I explained to the patient to start pain medication may actually worsen her delirium and make her more confused as well as a higher fall risk. The patient's son understands this but still would like to try to better manage her pain  which seems to be diffuse and primarily located in her lower back. Therefore we will discontinue tramadol and replace with Norco 5/325 one by mouth 3 times a day, 90 tablets per month. I will also consult hospice after obtaining the son's permission for assistance at home. I question if the patient may benefit from a better hospital bed with an air cushion to help prevent deterioration of her pressure sore.  Patient still sees a hematologist in one week however at the present time it appears that we're transitioning to comfort care.  Patient continues to report weakness and dizziness upon standing. I have recommended discontinuation of losartan as I believe the patient may still be dehydrated.

## 2017-03-15 ENCOUNTER — Other Ambulatory Visit: Payer: Self-pay | Admitting: Family Medicine

## 2017-03-15 MED ORDER — HYDROCODONE-ACETAMINOPHEN 5-325 MG PO TABS: 1.0000 | ORAL_TABLET | Freq: Four times a day (QID) | ORAL | 0 refills | Status: AC | PRN

## 2017-03-15 NOTE — Telephone Encounter (Signed)
Pt was admitted to Hospice and they need for her pain med rx to go [facility]. Can you please resend it to them. Rx was wrote on 03/13/17 - I called [facility] and they had not picked it up from there yet so I cancelled it. Just need resent to [place] [facility].

## 2017-03-17 ENCOUNTER — Encounter (HOSPITAL_COMMUNITY): Payer: Medicare HMO

## 2017-03-17 DIAGNOSIS — G4734 Idiopathic sleep related nonobstructive alveolar hypoventilation: Secondary | ICD-10-CM | POA: Diagnosis not present

## 2017-03-21 ENCOUNTER — Ambulatory Visit (HOSPITAL_COMMUNITY): Payer: Medicare HMO | Admitting: Hematology and Oncology

## 2017-03-21 DIAGNOSIS — R69 Illness, unspecified: Secondary | ICD-10-CM | POA: Diagnosis not present

## 2017-03-24 ENCOUNTER — Encounter (HOSPITAL_COMMUNITY): Payer: Medicare HMO

## 2017-04-01 DIAGNOSIS — T1490XA Injury, unspecified, initial encounter: Secondary | ICD-10-CM | POA: Diagnosis not present

## 2017-04-07 DEATH — deceased

## 2017-08-23 ENCOUNTER — Ambulatory Visit: Payer: Medicare HMO | Admitting: Gastroenterology

## 2017-12-18 ENCOUNTER — Encounter (INDEPENDENT_AMBULATORY_CARE_PROVIDER_SITE_OTHER): Payer: Medicare HMO | Admitting: Ophthalmology
